# Patient Record
Sex: Female | Born: 1955 | Race: Black or African American | Hispanic: No | Marital: Single | Smoking: Former smoker
Health system: Southern US, Community
[De-identification: ages and names within clinical notes are randomized; demographics above are authoritative.]

## PROBLEM LIST (undated history)

## (undated) DIAGNOSIS — Q357 Cleft uvula: Secondary | ICD-10-CM

## (undated) DIAGNOSIS — D8683 Sarcoid iridocyclitis: Secondary | ICD-10-CM

## (undated) DIAGNOSIS — Z87412 Personal history of vulvar dysplasia: Secondary | ICD-10-CM

## (undated) DIAGNOSIS — Z8619 Personal history of other infectious and parasitic diseases: Secondary | ICD-10-CM

## (undated) DIAGNOSIS — I10 Essential (primary) hypertension: Secondary | ICD-10-CM

## (undated) DIAGNOSIS — R911 Solitary pulmonary nodule: Secondary | ICD-10-CM

## (undated) DIAGNOSIS — Z973 Presence of spectacles and contact lenses: Secondary | ICD-10-CM

## (undated) DIAGNOSIS — C541 Malignant neoplasm of endometrium: Secondary | ICD-10-CM

## (undated) DIAGNOSIS — Z9221 Personal history of antineoplastic chemotherapy: Secondary | ICD-10-CM

## (undated) DIAGNOSIS — Z923 Personal history of irradiation: Secondary | ICD-10-CM

## (undated) DIAGNOSIS — Z85048 Personal history of other malignant neoplasm of rectum, rectosigmoid junction, and anus: Secondary | ICD-10-CM

## (undated) DIAGNOSIS — C210 Malignant neoplasm of anus, unspecified: Secondary | ICD-10-CM

## (undated) DIAGNOSIS — G5611 Other lesions of median nerve, right upper limb: Secondary | ICD-10-CM

## (undated) DIAGNOSIS — D860 Sarcoidosis of lung: Secondary | ICD-10-CM

## (undated) DIAGNOSIS — R058 Other specified cough: Secondary | ICD-10-CM

## (undated) DIAGNOSIS — K219 Gastro-esophageal reflux disease without esophagitis: Secondary | ICD-10-CM

## (undated) DIAGNOSIS — Z87730 Personal history of (corrected) cleft lip and palate: Secondary | ICD-10-CM

## (undated) DIAGNOSIS — Z5189 Encounter for other specified aftercare: Secondary | ICD-10-CM

## (undated) DIAGNOSIS — R05 Cough: Secondary | ICD-10-CM

## (undated) DIAGNOSIS — Z87410 Personal history of cervical dysplasia: Secondary | ICD-10-CM

## (undated) HISTORY — DX: Solitary pulmonary nodule: R91.1

## (undated) HISTORY — DX: Malignant neoplasm of anus, unspecified: C21.0

## (undated) HISTORY — DX: Encounter for other specified aftercare: Z51.89

## (undated) HISTORY — DX: Cleft uvula: Q35.7

## (undated) HISTORY — DX: Essential (primary) hypertension: I10

## (undated) HISTORY — DX: Gastro-esophageal reflux disease without esophagitis: K21.9

## (undated) HISTORY — PX: OTHER SURGICAL HISTORY: SHX169

## (undated) HISTORY — PX: CLEFT LIP REPAIR: SHX5315

---

## 1998-07-01 ENCOUNTER — Emergency Department (HOSPITAL_COMMUNITY): Admission: EM | Admit: 1998-07-01 | Discharge: 1998-07-01 | Payer: Self-pay | Admitting: Emergency Medicine

## 1999-10-23 ENCOUNTER — Other Ambulatory Visit: Admission: RE | Admit: 1999-10-23 | Discharge: 1999-10-23 | Payer: Self-pay | Admitting: Obstetrics and Gynecology

## 2000-02-14 ENCOUNTER — Encounter: Payer: Self-pay | Admitting: Cardiology

## 2000-02-14 ENCOUNTER — Encounter: Admission: RE | Admit: 2000-02-14 | Discharge: 2000-02-14 | Payer: Self-pay | Admitting: Cardiology

## 2000-03-03 ENCOUNTER — Emergency Department (HOSPITAL_COMMUNITY): Admission: EM | Admit: 2000-03-03 | Discharge: 2000-03-03 | Payer: Self-pay | Admitting: Emergency Medicine

## 2000-03-03 ENCOUNTER — Encounter: Payer: Self-pay | Admitting: Emergency Medicine

## 2000-09-13 ENCOUNTER — Emergency Department (HOSPITAL_COMMUNITY): Admission: EM | Admit: 2000-09-13 | Discharge: 2000-09-13 | Payer: Self-pay | Admitting: Emergency Medicine

## 2000-09-13 ENCOUNTER — Encounter: Payer: Self-pay | Admitting: Emergency Medicine

## 2000-09-22 ENCOUNTER — Ambulatory Visit (HOSPITAL_COMMUNITY): Admission: RE | Admit: 2000-09-22 | Discharge: 2000-09-22 | Payer: Self-pay | Admitting: Cardiology

## 2000-11-07 ENCOUNTER — Encounter: Admission: RE | Admit: 2000-11-07 | Discharge: 2000-11-07 | Payer: Self-pay | Admitting: Internal Medicine

## 2001-06-12 ENCOUNTER — Other Ambulatory Visit: Admission: RE | Admit: 2001-06-12 | Discharge: 2001-06-12 | Payer: Self-pay | Admitting: Obstetrics and Gynecology

## 2002-09-24 ENCOUNTER — Other Ambulatory Visit: Admission: RE | Admit: 2002-09-24 | Discharge: 2002-09-24 | Payer: Self-pay | Admitting: Obstetrics and Gynecology

## 2003-08-14 ENCOUNTER — Emergency Department (HOSPITAL_COMMUNITY): Admission: EM | Admit: 2003-08-14 | Discharge: 2003-08-15 | Payer: Self-pay | Admitting: Emergency Medicine

## 2003-12-17 DIAGNOSIS — Z85048 Personal history of other malignant neoplasm of rectum, rectosigmoid junction, and anus: Secondary | ICD-10-CM | POA: Insufficient documentation

## 2004-02-22 ENCOUNTER — Other Ambulatory Visit: Admission: RE | Admit: 2004-02-22 | Discharge: 2004-02-22 | Payer: Self-pay | Admitting: Obstetrics and Gynecology

## 2004-03-16 ENCOUNTER — Encounter: Admission: RE | Admit: 2004-03-16 | Discharge: 2004-03-16 | Payer: Self-pay | Admitting: General Surgery

## 2004-03-23 ENCOUNTER — Ambulatory Visit: Admission: RE | Admit: 2004-03-23 | Discharge: 2004-06-21 | Payer: Self-pay | Admitting: Radiation Oncology

## 2004-03-23 ENCOUNTER — Encounter: Admission: RE | Admit: 2004-03-23 | Discharge: 2004-03-23 | Payer: Self-pay | Admitting: Oncology

## 2004-03-27 ENCOUNTER — Encounter (INDEPENDENT_AMBULATORY_CARE_PROVIDER_SITE_OTHER): Payer: Self-pay | Admitting: *Deleted

## 2004-03-27 ENCOUNTER — Inpatient Hospital Stay (HOSPITAL_COMMUNITY): Admission: EM | Admit: 2004-03-27 | Discharge: 2004-03-30 | Payer: Self-pay | Admitting: General Surgery

## 2004-03-27 HISTORY — PX: OTHER SURGICAL HISTORY: SHX169

## 2004-05-31 ENCOUNTER — Inpatient Hospital Stay (HOSPITAL_COMMUNITY): Admission: RE | Admit: 2004-05-31 | Discharge: 2004-06-06 | Payer: Self-pay | Admitting: Oncology

## 2004-07-12 ENCOUNTER — Ambulatory Visit (HOSPITAL_COMMUNITY): Admission: RE | Admit: 2004-07-12 | Discharge: 2004-07-12 | Payer: Self-pay | Admitting: Oncology

## 2004-07-12 ENCOUNTER — Ambulatory Visit: Admission: RE | Admit: 2004-07-12 | Discharge: 2004-07-12 | Payer: Self-pay | Admitting: Radiation Oncology

## 2004-08-21 ENCOUNTER — Ambulatory Visit (HOSPITAL_COMMUNITY): Admission: RE | Admit: 2004-08-21 | Discharge: 2004-08-21 | Payer: Self-pay | Admitting: Cardiology

## 2004-09-20 ENCOUNTER — Ambulatory Visit (HOSPITAL_COMMUNITY): Admission: RE | Admit: 2004-09-20 | Discharge: 2004-09-20 | Payer: Self-pay | Admitting: Oncology

## 2004-10-11 ENCOUNTER — Ambulatory Visit: Admission: RE | Admit: 2004-10-11 | Discharge: 2004-10-11 | Payer: Self-pay | Admitting: Radiation Oncology

## 2004-10-16 ENCOUNTER — Ambulatory Visit (HOSPITAL_COMMUNITY): Admission: RE | Admit: 2004-10-16 | Discharge: 2004-10-16 | Payer: Self-pay | Admitting: General Surgery

## 2004-10-16 ENCOUNTER — Encounter (INDEPENDENT_AMBULATORY_CARE_PROVIDER_SITE_OTHER): Payer: Self-pay | Admitting: *Deleted

## 2004-10-16 HISTORY — PX: OTHER SURGICAL HISTORY: SHX169

## 2004-12-06 ENCOUNTER — Ambulatory Visit: Payer: Self-pay | Admitting: Oncology

## 2005-01-07 ENCOUNTER — Ambulatory Visit (HOSPITAL_COMMUNITY): Admission: RE | Admit: 2005-01-07 | Discharge: 2005-01-07 | Payer: Self-pay | Admitting: Surgery

## 2005-02-27 ENCOUNTER — Ambulatory Visit: Payer: Self-pay | Admitting: Oncology

## 2005-03-01 ENCOUNTER — Ambulatory Visit (HOSPITAL_COMMUNITY): Admission: RE | Admit: 2005-03-01 | Discharge: 2005-03-01 | Payer: Self-pay | Admitting: Oncology

## 2005-04-11 ENCOUNTER — Ambulatory Visit: Admission: RE | Admit: 2005-04-11 | Discharge: 2005-04-11 | Payer: Self-pay | Admitting: Radiation Oncology

## 2005-05-21 ENCOUNTER — Ambulatory Visit: Admission: RE | Admit: 2005-05-21 | Discharge: 2005-07-22 | Payer: Self-pay | Admitting: Radiation Oncology

## 2005-06-06 ENCOUNTER — Ambulatory Visit (HOSPITAL_COMMUNITY): Admission: RE | Admit: 2005-06-06 | Discharge: 2005-06-06 | Payer: Self-pay | Admitting: Surgery

## 2005-06-06 ENCOUNTER — Encounter (INDEPENDENT_AMBULATORY_CARE_PROVIDER_SITE_OTHER): Payer: Self-pay | Admitting: Specialist

## 2005-06-06 ENCOUNTER — Ambulatory Visit (HOSPITAL_BASED_OUTPATIENT_CLINIC_OR_DEPARTMENT_OTHER): Admission: RE | Admit: 2005-06-06 | Discharge: 2005-06-06 | Payer: Self-pay | Admitting: Surgery

## 2005-06-28 ENCOUNTER — Ambulatory Visit: Payer: Self-pay | Admitting: Oncology

## 2005-07-02 ENCOUNTER — Ambulatory Visit (HOSPITAL_COMMUNITY): Admission: RE | Admit: 2005-07-02 | Discharge: 2005-07-02 | Payer: Self-pay | Admitting: Oncology

## 2005-07-12 ENCOUNTER — Ambulatory Visit: Payer: Self-pay | Admitting: Internal Medicine

## 2005-07-19 ENCOUNTER — Ambulatory Visit: Payer: Self-pay | Admitting: Internal Medicine

## 2005-09-05 ENCOUNTER — Encounter: Admission: RE | Admit: 2005-09-05 | Discharge: 2005-09-05 | Payer: Self-pay | Admitting: Cardiology

## 2005-10-07 ENCOUNTER — Ambulatory Visit (HOSPITAL_COMMUNITY): Admission: RE | Admit: 2005-10-07 | Discharge: 2005-10-07 | Payer: Self-pay | Admitting: Oncology

## 2005-10-23 ENCOUNTER — Ambulatory Visit: Payer: Self-pay | Admitting: Oncology

## 2005-10-31 ENCOUNTER — Encounter: Admission: RE | Admit: 2005-10-31 | Discharge: 2005-10-31 | Payer: Self-pay | Admitting: Cardiology

## 2005-12-23 ENCOUNTER — Encounter: Admission: RE | Admit: 2005-12-23 | Discharge: 2005-12-23 | Payer: Self-pay | Admitting: Orthopedic Surgery

## 2006-02-12 ENCOUNTER — Ambulatory Visit: Payer: Self-pay | Admitting: Oncology

## 2006-02-17 ENCOUNTER — Ambulatory Visit (HOSPITAL_COMMUNITY): Admission: RE | Admit: 2006-02-17 | Discharge: 2006-02-17 | Payer: Self-pay | Admitting: Oncology

## 2006-04-07 ENCOUNTER — Encounter: Admission: RE | Admit: 2006-04-07 | Discharge: 2006-04-07 | Payer: Self-pay | Admitting: Cardiology

## 2006-06-06 ENCOUNTER — Ambulatory Visit (HOSPITAL_COMMUNITY): Admission: RE | Admit: 2006-06-06 | Discharge: 2006-06-06 | Payer: Self-pay | Admitting: Cardiology

## 2006-06-06 ENCOUNTER — Encounter (INDEPENDENT_AMBULATORY_CARE_PROVIDER_SITE_OTHER): Payer: Self-pay | Admitting: *Deleted

## 2006-06-27 ENCOUNTER — Encounter (HOSPITAL_COMMUNITY): Admission: RE | Admit: 2006-06-27 | Discharge: 2006-08-28 | Payer: Self-pay | Admitting: Cardiology

## 2006-06-27 HISTORY — PX: CARDIOVASCULAR STRESS TEST: SHX262

## 2006-08-13 ENCOUNTER — Ambulatory Visit: Payer: Self-pay | Admitting: Oncology

## 2006-08-19 ENCOUNTER — Ambulatory Visit (HOSPITAL_COMMUNITY): Admission: RE | Admit: 2006-08-19 | Discharge: 2006-08-19 | Payer: Self-pay | Admitting: Oncology

## 2006-08-19 LAB — CBC WITH DIFFERENTIAL/PLATELET
EOS%: 3.9 % (ref 0.0–7.0)
Eosinophils Absolute: 0.2 10*3/uL (ref 0.0–0.5)
MCH: 33.7 pg (ref 26.0–34.0)
MCV: 93.6 fL (ref 81.0–101.0)
MONO%: 7.8 % (ref 0.0–13.0)
NEUT#: 4.1 10*3/uL (ref 1.5–6.5)
RBC: 3.73 10*6/uL (ref 3.70–5.32)
RDW: 13.9 % (ref 11.3–14.5)

## 2006-08-19 LAB — COMPREHENSIVE METABOLIC PANEL
ALT: 23 U/L (ref 0–40)
AST: 21 U/L (ref 0–37)
Albumin: 4.2 g/dL (ref 3.5–5.2)
Alkaline Phosphatase: 116 U/L (ref 39–117)
Potassium: 4.4 mEq/L (ref 3.5–5.3)
Sodium: 136 mEq/L (ref 135–145)
Total Protein: 8 g/dL (ref 6.0–8.3)

## 2006-10-10 ENCOUNTER — Encounter (INDEPENDENT_AMBULATORY_CARE_PROVIDER_SITE_OTHER): Payer: Self-pay | Admitting: *Deleted

## 2006-10-10 ENCOUNTER — Ambulatory Visit (HOSPITAL_COMMUNITY): Admission: RE | Admit: 2006-10-10 | Discharge: 2006-10-10 | Payer: Self-pay | Admitting: General Surgery

## 2006-10-10 HISTORY — PX: OTHER SURGICAL HISTORY: SHX169

## 2006-12-17 ENCOUNTER — Ambulatory Visit (HOSPITAL_BASED_OUTPATIENT_CLINIC_OR_DEPARTMENT_OTHER): Admission: RE | Admit: 2006-12-17 | Discharge: 2006-12-17 | Payer: Self-pay | Admitting: Orthopedic Surgery

## 2007-02-09 ENCOUNTER — Ambulatory Visit: Payer: Self-pay | Admitting: Oncology

## 2007-02-11 LAB — CBC WITH DIFFERENTIAL/PLATELET
BASO%: 0 % (ref 0.0–2.0)
Eosinophils Absolute: 0 10*3/uL (ref 0.0–0.5)
LYMPH%: 9.1 % — ABNORMAL LOW (ref 14.0–48.0)
MCHC: 36.5 g/dL — ABNORMAL HIGH (ref 32.0–36.0)
MCV: 91.2 fL (ref 81.0–101.0)
MONO%: 4 % (ref 0.0–13.0)
Platelets: 264 10*3/uL (ref 145–400)
RBC: 3.88 10*6/uL (ref 3.70–5.32)

## 2007-02-11 LAB — COMPREHENSIVE METABOLIC PANEL
Alkaline Phosphatase: 129 U/L — ABNORMAL HIGH (ref 39–117)
Creatinine, Ser: 0.79 mg/dL (ref 0.40–1.20)
Glucose, Bld: 107 mg/dL — ABNORMAL HIGH (ref 70–99)
Sodium: 139 mEq/L (ref 135–145)
Total Bilirubin: 0.8 mg/dL (ref 0.3–1.2)
Total Protein: 8.2 g/dL (ref 6.0–8.3)

## 2007-02-13 ENCOUNTER — Ambulatory Visit (HOSPITAL_COMMUNITY): Admission: RE | Admit: 2007-02-13 | Discharge: 2007-02-13 | Payer: Self-pay | Admitting: Oncology

## 2007-03-31 ENCOUNTER — Encounter: Admission: RE | Admit: 2007-03-31 | Discharge: 2007-03-31 | Payer: Self-pay | Admitting: Cardiology

## 2007-08-14 ENCOUNTER — Ambulatory Visit (HOSPITAL_COMMUNITY): Admission: RE | Admit: 2007-08-14 | Discharge: 2007-08-14 | Payer: Self-pay | Admitting: Oncology

## 2007-08-14 ENCOUNTER — Ambulatory Visit: Payer: Self-pay | Admitting: Oncology

## 2007-12-13 ENCOUNTER — Emergency Department (HOSPITAL_COMMUNITY): Admission: EM | Admit: 2007-12-13 | Discharge: 2007-12-13 | Payer: Self-pay | Admitting: Emergency Medicine

## 2008-01-19 ENCOUNTER — Encounter: Payer: Self-pay | Admitting: Obstetrics and Gynecology

## 2008-01-20 ENCOUNTER — Inpatient Hospital Stay (HOSPITAL_COMMUNITY): Admission: EM | Admit: 2008-01-20 | Discharge: 2008-01-27 | Payer: Self-pay | Admitting: Cardiology

## 2008-01-20 ENCOUNTER — Ambulatory Visit: Payer: Self-pay | Admitting: Infectious Disease

## 2008-01-20 ENCOUNTER — Ambulatory Visit: Payer: Self-pay | Admitting: Emergency Medicine

## 2008-01-22 ENCOUNTER — Encounter: Payer: Self-pay | Admitting: Pulmonary Disease

## 2008-01-22 HISTORY — PX: BRONCHOSCOPY: SUR163

## 2008-03-07 DIAGNOSIS — K219 Gastro-esophageal reflux disease without esophagitis: Secondary | ICD-10-CM | POA: Insufficient documentation

## 2008-03-07 DIAGNOSIS — I10 Essential (primary) hypertension: Secondary | ICD-10-CM | POA: Insufficient documentation

## 2008-03-07 DIAGNOSIS — Z6838 Body mass index (BMI) 38.0-38.9, adult: Secondary | ICD-10-CM | POA: Insufficient documentation

## 2008-03-07 DIAGNOSIS — D860 Sarcoidosis of lung: Secondary | ICD-10-CM | POA: Insufficient documentation

## 2008-03-07 DIAGNOSIS — J984 Other disorders of lung: Secondary | ICD-10-CM | POA: Insufficient documentation

## 2008-03-07 DIAGNOSIS — D869 Sarcoidosis, unspecified: Secondary | ICD-10-CM | POA: Insufficient documentation

## 2008-03-08 ENCOUNTER — Ambulatory Visit: Payer: Self-pay | Admitting: Internal Medicine

## 2008-04-05 ENCOUNTER — Ambulatory Visit: Payer: Self-pay | Admitting: Internal Medicine

## 2008-04-20 ENCOUNTER — Ambulatory Visit: Payer: Self-pay | Admitting: Oncology

## 2008-04-22 LAB — CBC WITH DIFFERENTIAL/PLATELET
Basophils Absolute: 0 10*3/uL (ref 0.0–0.1)
EOS%: 3.6 % (ref 0.0–7.0)
Eosinophils Absolute: 0.2 10*3/uL (ref 0.0–0.5)
HGB: 12.4 g/dL (ref 11.6–15.9)
MCH: 32.9 pg (ref 26.0–34.0)
MONO#: 0.5 10*3/uL (ref 0.1–0.9)
NEUT#: 4.7 10*3/uL (ref 1.5–6.5)
RDW: 14 % (ref 11.3–14.5)
WBC: 6.2 10*3/uL (ref 3.9–10.0)
lymph#: 0.8 10*3/uL — ABNORMAL LOW (ref 0.9–3.3)

## 2008-04-22 LAB — COMPREHENSIVE METABOLIC PANEL
AST: 33 U/L (ref 0–37)
Albumin: 4.3 g/dL (ref 3.5–5.2)
BUN: 12 mg/dL (ref 6–23)
CO2: 25 mEq/L (ref 19–32)
Calcium: 9.1 mg/dL (ref 8.4–10.5)
Chloride: 105 mEq/L (ref 96–112)
Potassium: 3.8 mEq/L (ref 3.5–5.3)

## 2008-04-27 ENCOUNTER — Ambulatory Visit: Payer: Self-pay | Admitting: Internal Medicine

## 2008-08-11 ENCOUNTER — Inpatient Hospital Stay (HOSPITAL_COMMUNITY): Admission: EM | Admit: 2008-08-11 | Discharge: 2008-08-16 | Payer: Self-pay | Admitting: Emergency Medicine

## 2008-08-14 HISTORY — PX: LAPAROSCOPIC CHOLECYSTECTOMY: SUR755

## 2008-08-15 ENCOUNTER — Encounter (INDEPENDENT_AMBULATORY_CARE_PROVIDER_SITE_OTHER): Payer: Self-pay | Admitting: General Surgery

## 2008-11-24 ENCOUNTER — Ambulatory Visit: Payer: Self-pay | Admitting: Internal Medicine

## 2008-11-24 DIAGNOSIS — J069 Acute upper respiratory infection, unspecified: Secondary | ICD-10-CM | POA: Insufficient documentation

## 2008-12-26 ENCOUNTER — Ambulatory Visit: Payer: Self-pay | Admitting: Internal Medicine

## 2008-12-28 LAB — CONVERTED CEMR LAB: Angiotensin 1 Converting Enzyme: 64 units/L (ref 9–67)

## 2009-02-20 ENCOUNTER — Ambulatory Visit: Payer: Self-pay | Admitting: Internal Medicine

## 2009-04-17 ENCOUNTER — Ambulatory Visit: Payer: Self-pay | Admitting: Internal Medicine

## 2009-05-10 LAB — HM COLONOSCOPY

## 2009-05-12 ENCOUNTER — Encounter: Admission: RE | Admit: 2009-05-12 | Discharge: 2009-05-12 | Payer: Self-pay | Admitting: Cardiology

## 2009-05-31 ENCOUNTER — Encounter: Admission: RE | Admit: 2009-05-31 | Discharge: 2009-05-31 | Payer: Self-pay | Admitting: Cardiology

## 2009-06-12 ENCOUNTER — Ambulatory Visit: Payer: Self-pay | Admitting: Internal Medicine

## 2009-09-05 ENCOUNTER — Ambulatory Visit (HOSPITAL_BASED_OUTPATIENT_CLINIC_OR_DEPARTMENT_OTHER): Admission: RE | Admit: 2009-09-05 | Discharge: 2009-09-05 | Payer: Self-pay | Admitting: Orthopedic Surgery

## 2010-01-05 ENCOUNTER — Emergency Department (HOSPITAL_COMMUNITY): Admission: EM | Admit: 2010-01-05 | Discharge: 2010-01-05 | Payer: Self-pay | Admitting: Emergency Medicine

## 2010-02-08 ENCOUNTER — Telehealth: Payer: Self-pay | Admitting: Internal Medicine

## 2010-02-08 ENCOUNTER — Encounter: Admission: RE | Admit: 2010-02-08 | Discharge: 2010-02-08 | Payer: Self-pay | Admitting: Cardiology

## 2010-02-09 ENCOUNTER — Ambulatory Visit: Payer: Self-pay | Admitting: Internal Medicine

## 2010-03-23 ENCOUNTER — Ambulatory Visit: Payer: Self-pay | Admitting: Internal Medicine

## 2010-04-14 ENCOUNTER — Emergency Department (HOSPITAL_COMMUNITY): Admission: EM | Admit: 2010-04-14 | Discharge: 2010-04-14 | Payer: Self-pay | Admitting: Emergency Medicine

## 2011-01-06 ENCOUNTER — Encounter: Payer: Self-pay | Admitting: Cardiology

## 2011-01-06 ENCOUNTER — Encounter: Payer: Self-pay | Admitting: Orthopedic Surgery

## 2011-01-06 ENCOUNTER — Encounter: Payer: Self-pay | Admitting: Oncology

## 2011-01-15 NOTE — Assessment & Plan Note (Signed)
Summary: Pulmonary/ ext f/u ov for eval of cough/sarcoid   Primary Provider/Referring Provider:  Spruill  CC:  6 wk followup with PFT's.  The patient c/o a dry cough. No mucus production.Marland Kitchen  History of Present Illness: 67 yobf minimal smoking hx admitted from 01/20/2008 through 01/27/2008 for evaluation of an FUO with cough and parenchymal pulmonary infiltrates which by transbronchial biopsy dated 2 /05/2008 represented noncaseating  granulomatous  inflammation.  She previously was diagnosed with  suspected sarcoid in 1996 but prior to that had not required any form of prednisone.  Last chronic prednisone  04/15/08  with no flare cough no nodules (right leg and left hand and right arm),  November 24, 2008 ov abrupt onset 11/13/08  coughing and fever and chills and aches rx with abx and prednisone rx since 12/8 and by day of ov just bothered by cough.   December 26, 2008 worse day of ov with fever feels same as in past, each time in setting of having stopped prednisone in last 1 -2 weeks prior to ov, rec floor of 10 mg one half daily  February 20, 2009 ov no flares since last ov on Prednisone 10mg  one half daily rec one half every other day  Apr 17, 2009 on pred 10 one half every other day no flare of sob, ocular or articular co's, fever chills or sweats or unintended wt loss or nausea/anorexia. Did have flare of cough with pollen, now better.  Try Prednisone every 3 rd  day.   June 12, 2009 no symptoms on 2/3 days when doesn't take prednisone, no nausea, rash, cough, sob, articular or ocular c/o's.  so stop prednisone > no trouble.  March 23, 2010  cc  dry cough x 2months ever since the flu, day > night assoc with clear  nasal drainage. no ocular or articular co's.  Pt denies any significant sore throat, dysphagia, itching, sneezing,  nasal congestion or excess secretions,  fever, chills, sweats, unintended wt loss, pleuritic or exertional cp, hempoptysis, change in activity tolerance  orthopnea pnd or  leg swelling . Pt also denies any obvious fluctuation in symptoms with weather or environmental change or other alleviating or aggravating factors.        Current Medications (verified): 1)  Diovan 80 Mg Tabs (Valsartan) .... Take 1 Tablet By Mouth Once A Day 2)  Ventolin Hfa 108 (90 Base) Mcg/act  Aers (Albuterol Sulfate) .... 2 Puffs Every 4 Hrs As Needed 3)  Furosemide 20 Mg Tabs (Furosemide) .... Take 1 Tablet By Mouth Once A Day 4)  Aciphex 20 Mg Tbec (Rabeprazole Sodium) .... Take 1 Tablet By Mouth Once A Day 5)  Multivitamins  Tabs (Multiple Vitamin) .... Take 1 Tablet By Mouth Once A Day  Allergies (verified): 1)  ! Penicillin  Past History:  Past Medical History: HYPERTENSION (ICD-401.9) PULMONARY NODULE, LEFT UPPER LOBE (ICD-518.89) OBESITY (ICD-278.00) SARCOIDOSIS (ICD-135) originally evaluated  elsewhere in about 1996 with no tissue diagnosis       - TBBX pos ncg 01/22/08      - Chronic pred rx 2/09> 5/09      - Restart Pred December 26, 2008  >   June 12, 2009  stopped      - PFT's March 23, 2010 VC 1.77 (66%) no obst,  ERV 34%, DLCO 53 > 115% corrected G E R D (ICD-530.81) rectal cancer status post chemotherapy and radiation July 2005       Vital Signs:  Patient profile:   55 year old female Height:      66 inches (167.64 cm) Weight:      221 pounds (100.45 kg) BMI:     35.80 O2 Sat:      90 % on Room air Temp:     97.9 degrees F (36.61 degrees C) oral Pulse rate:   90 / minute BP sitting:   132 / 80  (left arm) Cuff size:   large  Vitals Entered By: Michel Bickers CMA (March 23, 2010 9:56 AM)  O2 Sat at Rest %:  90 O2 Flow:  Room air  Physical Exam  Additional Exam:  Ambulatory mod obese bf in no acute distress.  wt  216 November 24, 2008  > 220 December 26, 2008 >  221 Apr 17, 2009 > 221 June 12, 2009 > 230 February 10, 2010  > 221 March 23, 2010  HEENT: nl dentition, turbinates, and orophanx. Nl external ear canals without cough reflex Neck without  JVD/Nodes/TM Lungs minimal insp and exp rhonchi. RRR no s3 or murmur or increase in P2 Abd soft and benign with nl excursion in the supine position. No bruits or organomegaly Ext warm without calf tenderness, cyanosis clubbing or edema Skin warm and dry without lesions     CXR  Procedure date:  03/23/2010  Findings:      Findings: Since 02/08/2010, regression of the airspace in passing the superimposed upon bibasilar chronic interstitial lung changes of known sarcoidosis.  Heart size is normal.  Slight right azygos and bilateral hilar adenopathy stable.  No new significant abnormalities seen.   IMPRESSION:   1.  Resolution previous right lower lobe patchy pneumonia superimposed on chronic interstitial lung disease. 2.  Stable chronic sarcoidosis. 3.  No interval acute abnormality.  Impression & Recommendations:  Problem # 1:  SARCOIDOSIS (ICD-135)  no evidence of activity off prednisone, continue off.  The goal with a chronic steroid dependent illness is always arriving at the lowest effective dose that controls the disease/symptoms and not accepting a set "formula" which is based on statistics that don't take into accound individual variability or the natural hx of the dz in every individual patient, which may well vary over time. For now floor in "0" prednisone using the philsophy that the punishment needs to fit the crime  Problem # 2:  G E R D (ICD-530.81)  Her cough seems most c/w uri vs Upper airway cough syndrome, so named because it's frequently impossible to sort out how much is LPR vs CR/sinusitis with freq throat clearing generating secondary extra esophageal GERD from wide swings in gastric pressure that occur with throat clearing, promoting self use of mint and menthol lozenges that reduce the lower esophageal sphincter tone and exacerbate the problem further.  These symptoms are easily confused with asthma/copd by even experienced pulmonogists because they overlap so  much.  These are the same pts who not infrequently have failed to tolerate ace inhibitors,  dry powder inhalers or biphosphonates or report having reflux symptoms that don't respond to standard doses of PPI.  See instructions for specific recommendations     Orders: Est. Patient Level IV (16109)  Other Orders: T-2 View CXR (71020TC)  Patient Instructions: 1)  whenever start coughing you need to take aciphex 20 mg Take one 30-60 min before first and last meals of the day and add pepcid 20 mg at bedtime 2)  For drainage down the throat try chlortrimeton 4 mg every  6 hours as needed 3)  GERD (REFLUX)  is a common cause of respiratory symptoms. It commonly presents without heartburn and can be treated with medication, but also with lifestyle changes including avoidance of late meals, excessive alcohol, smoking cessation, and avoid fatty foods, chocolate, peppermint, colas, red wine, and acidic juices such as orange juice. NO MINT OR MENTHOL PRODUCTS SO NO COUGH DROPS  4)  USE SUGARLESS CANDY INSTEAD (jolley ranchers)  5)  NO OIL BASED VITAMINS  6)  If not better in 2 weeks, return to office. 7)  If better after 2 weeks, drop off second dose of aciphex and return in 3 months

## 2011-01-15 NOTE — Miscellaneous (Signed)
Summary: Orders Update pft charges  Clinical Lists Changes  Orders: Added new Service order of Carbon Monoxide diffusing w/capacity (94720) - Signed Added new Service order of Lung Volumes (94240) - Signed Added new Service order of Spirometry (Pre & Post) (94060) - Signed 

## 2011-01-15 NOTE — Progress Notes (Signed)
Summary: appt?  Phone Note Call from Patient Call back at [facility] Phone [phone]   Caller: Patient Call For: Ardis Fullwood Reason for Call: Talk to Nurse Summary of Call: has had the flu.  Also has Sarcoidosis.  Her PCP told her to call MW and see if he wanted to see her.  She saw PCP today and they did CXR. Initial call taken by: Eugene Gavia,  February 08, 2010 2:00 PM  Follow-up for Phone Call        called and spoke with pt and she will come in the am to follow up with MW---she did see her primary care md today and did have cxr done---told to call to see if MW wanted to see her---last ov with MW was 05-2009 Randell Loop CMA  February 08, 2010 2:30 PM

## 2011-01-15 NOTE — Assessment & Plan Note (Signed)
Summary: Pulmonary/ uri   Copy to:  P Primary Provider/Referring Provider:  Spruill  CC:  Pt c/o productive cough with small amounts of yellow mucus, T-110, chills, bodyaches, and pt has been treated with antibiotic x 4 days three times daily 500mg  by Dr. Shana Chute. Pt was seen by Dr. Shana Chute 02-07-10 and advised to follow up with MW. Pt states had cxr yesterday.  History of Present Illness: 55-yobf minimal smoking hx admitted from 01/20/2008 through 01/27/2008 for evaluation of an FUO with cough and parenchymal pulmonary infiltrates which by transbronchial biopsy dated 2 /05/2008 represented noncaseating  granulomatous  inflammation.  She previously was diagnosed with  suspected sarcoid in 1996 but prior to that had not required any form of prednisone.  Last chronic prednisone  04/15/08  with no flare cough no nodules (right leg and left hand and right arm),  November 24, 2008 ov abrupt onset 11/13/08  coughing and fever and chills and aches rx with abx and prednisone rx since 12/8 and by day of ov just bothered by cough.   December 26, 2008 worse day of ov with fever feels same as in past, each time in setting of having stopped prednisone in last 1 -2 weeks prior to ov, rec floor of 10 mg one half daily  February 20, 2009 ov no flares since last ov on Prednisone 10mg  one half daily rec one half every other day  Apr 17, 2009 on pred 10 one half every other day no flare of sob, ocular or articular co's, fever chills or sweats or unintended wt loss or nausea/anorexia. Did have flare of cough with pollen, now better.  Try Prednisone every 3 rd  day.   June 12, 2009 no symptoms on 2/3 days when doesn't take prednisone, no nausea, rash, cough, sob, articular or ocular c/o's.  so stop prednisone > no trouble.  February 09, 2010 Pt c/o productive cough with small amounts of yellow mucus, T-100.1 , chills, bodyaches, pt has been treated with antibiotic x 4 /7 days three times daily 500mg  by Dr. Shana Chute. Pt was  seen by Dr. Shana Chute 02-07-10 and advised to follow up with MW. Pt states had cxr 2/24.  Pt denies any significant sore throat, dysphagia, itching, sneezing,  nasal congestion or excess secretions,  soaking sweats, unintended wt loss, pleuritic or exertional cp, hempoptysis, change in activity tolerance  orthopnea pnd or leg swelling.  no arthralgias, rash or ocular c/os.  Current Medications (verified): 1)  Diovan 80 Mg Tabs (Valsartan) .... Take 1 Tablet By Mouth Once A Day 2)  Ventolin Hfa 108 (90 Base) Mcg/act  Aers (Albuterol Sulfate) .... 2 Puffs Every 4 Hrs As Needed 3)  Furosemide 20 Mg Tabs (Furosemide) .... Take 1 Tablet By Mouth Once A Day 4)  Aciphex 20 Mg Tbec (Rabeprazole Sodium) .... Take 1 Tablet By Mouth Once A Day 5)  Multivitamins  Tabs (Multiple Vitamin) .... Take 1 Tablet By Mouth Once A Day  Allergies (verified): 1)  ! Penicillin  Past History:  Past Medical History: HYPERTENSION (ICD-401.9) PULMONARY NODULE, LEFT UPPER LOBE (ICD-518.89) OBESITY (ICD-278.00) SARCOIDOSIS (ICD-135) originally evaluated  elsewhere in about 1996 with no tissue diagnosis  rendered      - TBBX pos ncg 01/22/08      - Chronic pred rx 2/09> 5/09      - Restart Pred December 26, 2008  >   June 12, 2009  stopped G E R D (ICD-530.81) rectal cancer status post chemotherapy and  radiation July 2005       Vital Signs:  Patient profile:   55 year old female Height:      65 inches Weight:      230.13 pounds BMI:     38.43 O2 Sat:      95 % on Room air Temp:     98.1 degrees F oral Pulse rate:   85 / minute BP sitting:   160 / 96  (left arm) Cuff size:   large  Vitals Entered ByShanda Bumps Robinson(February 09, 2010 10:32 AM)  O2 Flow:  Room air CC: Pt c/o productive cough with small amounts of yellow mucus, T-110, chills, bodyaches, pt has been treated with antibiotic x 4 days three times daily 500mg  by Dr. Shana Chute. Pt was seen by Dr. Shana Chute 02-07-10 and advised to follow up with MW. Pt states  had cxr yesterday Comments Medications reviewed with patient Verified contact number and pharmacy with patient   Physical Exam  Additional Exam:  Ambulatory mod obese bf in no acute distress.  wt  216 November 24, 2008  > 220 December 26, 2008 > 221 02/20/09> 221 Apr 17, 2009 > 221 June 12, 2009 > 230 February 10, 2010  HEENT: nl dentition, turbinates, and orophanx. Nl external ear canals without cough reflex Neck without JVD/Nodes/TM Lungs minimal insp and exp rhonchi. RRR no s3 or murmur or increase in P2 Abd soft and benign with nl excursion in the supine position. No bruits or organomegaly Ext warm without calf tenderness, cyanosis clubbing or edema Skin warm and dry without lesions     Impression & Recommendations:  Problem # 1:  URI, ACUTE (ICD-465.9)  Explained natural h/o uri and why it's necessary in patients at risk to rx short term with PPI to reduce risk of evolving cyclical cough triggered by epithelial injury and a heightened sensitivty to the effects of any upper airway irritants,  most importantly acid - related.  See instructions for specific recommendations.    Orders: Est. Patient Level III (69678)  Problem # 2:  SARCOIDOSIS (ICD-135) no evidence of activity off prednisone, continue off.  Medications Added to Medication List This Visit: 1)  Diovan 80 Mg Tabs (Valsartan) .... Take 1 tablet by mouth once a day 2)  Furosemide 20 Mg Tabs (Furosemide) .... Take 1 tablet by mouth once a day 3)  Aciphex 20 Mg Tbec (Rabeprazole sodium) .... Take 1 tablet by mouth once a day 4)  Multivitamins Tabs (Multiple vitamin) .... Take 1 tablet by mouth once a day  Patient Instructions: 1)  Finish antibiotics 2)  Please schedule a follow-up appointment in 6 weeks, sooner if needed for cxr and pft's  3)  Continue the aciphex Take  one 30-60 min before first meal of the day 4)  GERD (REFLUX)  is a common cause of respiratory symptoms. It commonly presents without heartburn and  can be treated with medication, but also with lifestyle changes including avoidance of late meals, excessive alcohol, smoking cessation, and avoid fatty foods, chocolate, peppermint, colas, red wine, and acidic juices such as orange juice. NO MINT OR MENTHOL PRODUCTS SO NO COUGH DROPS  5)  USE SUGARLESS CANDY INSTEAD (jolley ranchers)  6)  NO OIL BASED VITAMINS

## 2011-03-03 LAB — URINALYSIS, ROUTINE W REFLEX MICROSCOPIC
Glucose, UA: NEGATIVE mg/dL
Nitrite: NEGATIVE
pH: 5 (ref 5.0–8.0)

## 2011-03-03 LAB — POCT PREGNANCY, URINE: Preg Test, Ur: NEGATIVE

## 2011-03-22 LAB — BASIC METABOLIC PANEL
BUN: 14 mg/dL (ref 6–23)
Calcium: 9.6 mg/dL (ref 8.4–10.5)
GFR calc non Af Amer: >60 mL/min (ref >60)
Potassium: 4.2 mEq/L (ref 3.5–5.1)
Sodium: 140 mEq/L (ref 135–145)

## 2011-03-22 LAB — POCT HEMOGLOBIN-HEMACUE: Hemoglobin: 12.3 g/dL (ref 12.0–15.0)

## 2011-04-30 NOTE — Consult Note (Signed)
NAMEMARKEYA, [name]               ACCOUNT NO.:  [id number]   MEDICAL RECORD NO.:  [id number]          PATIENT TYPE:  INP   LOCATION:  6732                         FACILITY:  [facility]   PHYSICIAN:  Anselm Pancoast. Weatherly, M.D.DATE OF BIRTH:  December 20, 1955   DATE OF CONSULTATION:  08/12/2008  DATE OF DISCHARGE:                                 CONSULTATION   REQUESTING PHYSICIAN:  Mohan N. Sharyn Lull, M.D.   REASON FOR CONSULTATION:  Biliary colic and cholelithiasis.   HISTORY OF PRESENT ILLNESS:  [name] is a 55 year old female patient  with past medical history of sarcoidosis and prior squamous cell  carcinoma of the rectum that was resected by Dr. Onalee Hua several years  ago.  She presented to the ER on August 11, 2008, with complaints of  right lateral upper abdominal pain, very severe in nature, that began  and awakened her from sleep, radiated over to the right upper quadrant  and the epigastric region.  This was associated with nausea and  diarrhea.  This is very severe.  She has never had pain like this  before.  She rated it 10/10.  The pain lasted for several hours and  resolved after she presented to the ER and received pain medications.  Subsequently, an ultrasound the abdomen was performed that demonstrated  cholelithiasis without evidence of acute cholecystitis or ductal  dilatation.  Surgical consultation was requested.   REVIEW OF SYSTEMS:  As per the history of present illness.  The history  present illness is specific to GI.  Otherwise, all other review of  system categories have been negative.   PAST MEDICAL HISTORY:  1. Hypertension.  2. Sarcoidosis.  3. History of recurrent UTI.   PAST SURGICAL HISTORY:  1. C-section.  2. 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  3. 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   ALLERGIES:  PENICILLIN, which causes hives.   MEDICATIONS:  Medications at home include Diovan and  Lomotil.  Here at  the hospital, the patient has been given various p.r.n. medications  including Morphine, Zofran, and Dilaudid.  She is receiving Protonix IV  as well and daily Lovenox for DVT prophylaxis.  She is on Benicar in  substitution for her Diovan.  She is also on Cipro and Flagyl IV.   SOCIAL HISTORY:  No alcohol.  No tobacco.  She works for a  [employer].  She is single and has 2 children.   PHYSICAL EXAMINATION:  GENERAL:  A pleasant female patient, currently  reporting that her prior right-sided upper abdominal pain has resolved.  VITAL SIGNS:  Temperature 98, BP 144/86, pulse 80 and regular, and  respirations 18.  NEUROLOGIC:  Cranial nerves II-XII are grossly intact.  Strength is  intact in the upper and lower extremities bilaterally.  Sensation is  intact in the upper and lower extremities bilaterally.  Gait,  ambulation, and DTRs not assessed.  PSYCH:  The patient is alert and oriented x3.  Affect is perfect to  current situation.  CHEST:  Bilateral lung  sounds are clear to auscultation posteriorly.  Respiratory effort is nonlabored.  She is satting 97% on room air.  CARDIOVASCULAR:  Heart sounds are S1 and S2 without obvious rales,  murmurs, rubs, or gallops.  No JVD.  Pulse is regular and  nontachycardiac.  No peripheral edema.  ABDOMEN:  Obese, but soft, mildly tender in the epigastric and right  upper quadrant, but no definite Murphy's sign.  EXTREMITIES:  Symmetrical in appearance without cyanosis or clubbing.   LAB:  White count 6200, hemoglobin 11.5, platelets 181,000.  Sodium 140,  potassium 3.3, CO2 28, glucose 145, BUN 9, creatinine 0.78, and alkaline  phosphatase was 120 yesterday, today is down to 93.  Otherwise, LFTs,  amylase, and lipase have been normal.  INR 0.9.  Hemoglobin A1c is  pending.   DIAGNOSTICS:  Ultrasound of the abdomen again revealed numerous  gallstones with a small contracted gallbladder without pericholecystic  fluid  or ductal dilatation.   IMPRESSION:  1. Biliary colic with cholelithiasis.  2. Hypertension.  3. Sarcoid.   PLAN:  1. At this time, we will probably proceed with laparoscopic      cholecystectomy in the morning.  The patient has already eaten      lunch today.  We will hold Lovenox tonight.  2. The patient is already on Cipro and Flagyl.  We will make sure she      receives a dose of Cipro and call to OR.  3. Risks and benefits have been discussed with the patient by Dr.      Dwain Sarna and the patient wishes to proceed.      Allison L. Rennis Harding, N.P.    ______________________________  Anselm Pancoast. Zachery Dakins, M.D.    ALE/MEDQ  D:  08/12/2008  T:  08/13/2008  Job:  [id number]

## 2011-04-30 NOTE — Op Note (Signed)
NAMEDANAYE, SOBH               ACCOUNT NO.:  [id number]   MEDICAL RECORD NO.:  [id number]          PATIENT TYPE:  INP   LOCATION:  3012                         FACILITY:  [facility]   PHYSICIAN:  Coralyn Helling, MD        DATE OF BIRTH:  1956-11-05   DATE OF PROCEDURE:  01/22/2008  DATE OF DISCHARGE:                               OPERATIVE REPORT   PREOPERATIVE DIAGNOSIS:  Rule out sarcoidosis.   POSTOPERATIVE DIAGNOSIS:  Rule out have sarcoidosis.   PROCEDURE:  Bronchoscopy with bronchoalveolar lavage, fine-needle  aspiration, and transbronchial biopsy.   DESCRIPTION OF PROCEDURE:  Ms. Garmany is a 55 year old female who has  mediastinal lymphadenopathy as well as multiple subcentimeter pulmonary  nodules.  She carries a diagnosis of sarcoidosis.  She is scheduled to  undergo bronchoscopy with biopsy.  The procedure was explained to the  patient.  Risks were detailed including possible risks of bleeding,  infection, pneumothorax, and no diagnosis.  Consent form was signed.  The patient was brought to the bronchoscopy suite.  She was given  nebulized lidocaine for anesthesia over her nares and her posterior  pharynx.  She was given total of 8 mg of Versed and 100 mcg fentanyl  intravenously for sedation and analgesia.   The bronchoscope was entered into the right naris.  The vocal cords  visualized, appeared to have normal motion.  Topical lidocaine was  instilled as needed.  The bronchoscope was entered into the trachea.  The carina was visualized and appeared not to be widened.  The mucosa  did appear to be hyperemic.  The bronchoscope was then entered into the  left main bronchus.  There were clear secretions from the airways but no  obvious endobronchial lesions.  The left upper lingular and lower lobe  orifices were visualized without any obvious endobronchial lesions.   The bronchoscope was then entered into the right main bronchus.  The  right upper and middle lobe orifices  were visualized without any obvious  inferior lesions.  There appeared to be extrinsic compression with  isolation of the right lower lobe bronchus intermedius; however, I was  able to pass the bronchoscope into this area.  Then, 120 mL of saline  was instilled into the right lower lobe with approximately 20 mL of  fluid returned, which was pink in color.   Then, fine-needle aspiration was done from the right paratracheal,  subcarinal, and right hilar lymph node regions.  Then, under  fluoroscopic guidance, transbronchial biopsy was done from the left  lower lobe, left upper lobe, and right upper lobe.  There was a minimal  amount of bleeding which resolved spontaneously.  There were no other  obvious immediate complications and the patient tolerated procedure well  otherwise.  The bronchoscope was then withdrawn and the patient was  returned to the recovery room in stable condition.  The specimens will  be sent for microbiology, cytology, and surgical pathology.  A chest x-  ray will be done and the recovery room.      Coralyn Helling, MD  Electronically Signed  VS/MEDQ  D:  01/22/2008  T:  01/24/2008  Job:  [id number]

## 2011-04-30 NOTE — Discharge Summary (Signed)
[name], [name]               ACCOUNT NO.:  [id number]   MEDICAL RECORD NO.:  [id number]          PATIENT TYPE:  INP   LOCATION:  3012                         FACILITY:  [facility]   PHYSICIAN:  Osvaldo Shipper. Spruill, M.D.DATE OF BIRTH:  10/19/1956   DATE OF ADMISSION:  01/20/2008  DATE OF DISCHARGE:  01/27/2008                               DISCHARGE SUMMARY   DISCHARGE DIAGNOSES:  1. Sarcoidosis.  2. Urinary tract infection.  3. Hypertension.  4. History of rectal anal malignancy.  5. Obesity.   CONSULTANT:  Coralyn Helling, MD.   PROCEDURE:  Endoscopic biopsy of the lung.   HISTORY:  Ms. Matura is a 55 year old patient who presented initially  to the [facility] with a complaint of fever.  The  patient was worked up in the [facility] and was noted to have a  nonacute complete blood count.  The urinalysis showed positive nitrates  with a specific gravity 1.020, moderate bilirubin 15 mg/dL of ketones.  However, the microscopic showed 0-2 white cells and 0-2 RBCs.  The  comprehensive metabolic panel showed the glucose to be slightly elevated  at 138, otherwise noncontributory.  Chest x-ray for this patient  revealed possible superimposed pneumonia and the patient was  subsequently transported to the [facility] for admission.   The admission history, physical and the hospital visits were completed  by Dr. Donia Guiles.   An infectious disease consultation was obtained.  It was their opinion  that this was a 55 year old patient with a history of sarcoidosis and  previous history of rectal cancer with recurrent fevers of unknown  origin.  The potential sources included pneumonia, urinary tract  infection, colitis and status post radiation herpetic related problems.   The patient's vital signs showed significant improvement.  Urine  cultures revealed no  particular growth.  An RPR was nonreactive.  Blood  cultures showed no significant growth.   It was the recommendation of the infectious disease as well as the  attending physician that the patient have bronchoscopy to further  evaluate the findings on x-ray and CT especially with the hilar  lymphadenopathy and left lower lobe infiltrate.  A pulmonary  consultation was obtained.   On January 22, 2008, the patient underwent bronchoscopy, fine-needle  aspiration and transbronchial biopsy.  There were no obvious  endobrachial lesions.  The left upper  lingula and lower lobe orifice  were visualized without any obvious endobronchial lesions. The right  upper and middle lobe orifices were visualized without any obvious  inferior lesion.  There appeared to be extrinsic compression with  isolation of the right lower lobe bronchus.  Transbronchial biopsies  were done under fluoroscopy.  There were no malignant cells identified.  There was some inflammation noted, otherwise normal.   After these results, it was deemed that there was probably not an  infectious cause of fever of unknown origin.  It was believed that the  fever may have been related to the acute exacerbation of the  sarcoidosis.  On January 27, 2008, it was the opinion that the patient  could be  discharged home having received maximum benefit from this  hospitalization.   FOLLOW UP:  The patient is to be seen in the office in 1 week and to be  followed up with  Pulmonary in 2 weeks or sooner if any changes,  problems or concerns.   DISCHARGE MEDICATIONS:  1. Prednisone 50 mg daily.  2. Ultram every 4-6 hours as needed for pain.  3. Ventolin 2 puffs every 6 hours.  4. Avapro 300 mg daily.  5. Acyclovir 500 mg daily.      Ivery Quale, P.A.      Osvaldo Shipper. Spruill, M.D.  Electronically Signed    HB/MEDQ  D:  03/02/2008  T:  03/03/2008  Job:  [id number]

## 2011-04-30 NOTE — Consult Note (Signed)
[name], [name]               ACCOUNT NO.:  [id number]   MEDICAL RECORD NO.:  [id number]          PATIENT TYPE:  INP   LOCATION:  6732                         FACILITY:  [facility]   PHYSICIAN:  Jordan Hawks. Elnoria Howard, MD    DATE OF BIRTH:  Feb 25, 1956   DATE OF CONSULTATION:  DATE OF DISCHARGE:  08/16/2008                                 CONSULTATION   REASON FOR CONSULTATION:  Mucoid bowel movement.   REFERRING PHYSICIAN:  Osvaldo Shipper. Spruill, M.D.   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  During workup, the  patient was noted to have symptomatic cholelithiasis, and subsequently,  she underwent a laparoscopic cholecystectomy, which was performed  without any complication.  Prior to her admission, the patient was being  evaluated by Dr. Bosie Clos for complaints a mucoid stool.  She had bowel  movements after every p.o. intake.  She described it as having think  amount of mucus, but no evidence of any hematochezia or melena.  The  patient denied having any abdominal pain, nausea and vomiting, fevers,  or chills.  She was last hospitalized in February 2009 for pneumonia and  subsequent diagnosis of her sarcoidosis but she denies taking any  medications, such as, any antibiotic medications of late, and she denies  any known sick contacts.  At this time, the patient is well, and she was  responding to antidiarrheals taken on a p.r.n. basis.  Her anal cancer  is also in remission at this time.  She states having a prior  colonoscopy.  In fact, she reports having colonoscopy every year, but  she is uncertain about which physician performs  the procedures.   PAST MEDICAL HISTORY AND PAST SURGICAL HISTORY:  As stated above.   FAMILY HISTORY:  Noncontributory.   SOCIAL HISTORY:  Negative for alcohol, tobacco, or illicit drug use.   ALLERGIES:  PENICILLIN.   MEDICATIONS:  1. Ciprofloxacin 500 mg p.o. b.i.d.  2. Lovenox 40 mg daily.  3. Ketorolac 10 mg p.o. q.6 h.  4. Roxicodone 10 mg p.o. daily.  5. Benicar 20 mg p.o. b.i.d.  6. Vicodin 1-2 tablets p.o. q.4 hours p.r.n. pain.  7. Zofran 4 mg IV q.4 h. p.r.n.   PHYSICAL EXAMINATION:  VITAL SIGNS:  Blood pressure is 152/80, heart  rate is 80, respirations 18, and temperature is 98.4.  GENERAL:  The patient is in no acute distress, alert, and oriented.  HEENT:  Normocephalic and atraumatic.  Extraocular muscles intact.  NECK:  Supple.  No lymphadenopathy.  LUNGS:  Clear to auscultation bilaterally.  CARDIOVASCULAR:  Regular rate and rhythm.  ABDOMEN:  Mildly obese, soft, and tender at the incision sites.  No  rebound or rigidity.  Positive bowel sounds.  EXTREMITIES:  No clubbing, cyanosis, or edema.  RECTAL:  Negative for any palpable masses, and there is some  hypopigmentation in the perianal area but no evidence of any masses and  is heme negative.   LABORATORY VALUES:  White blood cell count 6.0, hemoglobin 12.2, MCV is  93.1, and platelets at 182,000.  Sodium 136, potassium 3.7, chloride  101, CO2 27, glucose 134, BUN is 5, and  creatinine is 0.9.   IMPRESSION:  1. Mucoid diarrhea.  2. Status post symptomatic cholelithiasis.  3. Hypertension.  4. Sarcoidosis.   At this time, I am unable to identify the source of her complaints.  She  is responsive to antidiarrheals, but I do not feel that she needs to  stay in the hospital for further evaluation.  She is well at this time  and this may be a transient issue, but I feel further evaluation with a  flexible sigmoidoscopy may be warranted.  Given that she is status post  her cholecystectomy, she would like to  wait before undergoing any  further procedures at this time.  I will plan at this time to have the  patient discharged home, and I will see the patient in the office in 2  weeks.      Jordan Hawks Elnoria Howard, MD  Electronically Signed     PDH/MEDQ  D:  08/16/2008  T:  08/17/2008  Job:  [id number]   cc:   Osvaldo Shipper. Spruill, M.D.  Timothy E. Earlene Plater, M.D.

## 2011-04-30 NOTE — Op Note (Signed)
[name], [name]               ACCOUNT NO.:  [id number]   MEDICAL RECORD NO.:  [id number]          PATIENT TYPE:  INP   LOCATION:  6732                         FACILITY:  [facility]   PHYSICIAN:  Adolph Pollack, M.D.DATE OF BIRTH:  01-Aug-1956   DATE OF PROCEDURE:  08/14/2008  DATE OF DISCHARGE:                               OPERATIVE REPORT   PREOPERATIVE DIAGNOSIS:  Acute cholecystitis.   POSTOPERATIVE DIAGNOSIS:  Subacute cholecystitis.   PROCEDURE:  Laparoscopic cholecystectomy and intraoperative  cholangiogram.   SURGEON:  Adolph Pollack, MD   ASSISTANT:  Anselm Pancoast. Zachery Dakins, MD   ANESTHESIA:  General.   INDICATION:  This is a 55 year old female who presented to the hospital  and was admitted on August 11, 2008, with gallstones, elevated white  count, right upper quadrant tenderness, and clinical findings consistent  with acute cholecystitis.  She also had a Gram-positive bacteremia.  She  has been treated with antibiotics and is now brought to the operating  room for cholecystectomy.  Procedure and risks were discussed with her  preoperatively.   TECHNIQUE:  She was brought to the operating room and placed supine on  the operating table and a general anesthetic was administered.  The  abdominal wall was sterilely prepped and draped.  Marcaine was  infiltrated in the subumbilical region.  A small subumbilical incision  was made through the skin, subcutaneous tissue, fascia, and peritoneum  and entered the peritoneal cavity under direct vision.  A purse-string  suture of 0 Vicryl was placed around the fascial edges.  A Hasson trocar  was introduced into the peritoneal cavity and the pneumoperitoneum was  created by insufflation of CO2 gas.   Following this, the laparoscope was introduced.  She was placed in the  reverse Trendelenburg position and the right side was tilted up.  There  were omental adhesions of the gallbladder, which appeared to be somewhat  edematous.  An 11-mm trocar was placed in the epigastrium and two 5-mm  trocars were placed in the right upper quadrant.  The fundus of the  gallbladder was grasped and using blunt dissection, omental adhesions  were easily dissected off the gallbladder.  The fundus was then  retracted towards the right shoulder.  The infundibulum was then  mobilized with dissection close to the gallbladder.  I then isolated the  cystic duct, which appeared to be somewhat dilated and I could notice a  stone in the cystic duct obstructing it.  I made a window around the  cystic duct.  A clip was placed at the cystic duct gallbladder junction.  A small incision was made in the cystic duct.  I was able to crush the  stone and milk some of it out.  I then placed a Reddick  cholangiocatheter through the abdominal wall.  I then put into the  cystic duct and inflated the balloon and flushed and pulled back the  balloon until I got all of the stone debrided out of the cystic duct.  A  cholangiogram was then performed.   Under real time fluoroscopy, dilute contrast was injected into  the  cystic duct, which was of long length.  The common hepatic, right and  left hepatic, and common bile ducts all filled.  The contrast drained  promptly into the duodenum.  I did not see obvious evidence of  obstruction.  Final reports pending the radiologist interpretation.   The cholangiocatheter was removed, the cystic duct was clipped 3 times  proximally and divided.  I then identified anterior and posterior  branches of the cystic artery close to the gallbladder.  Windows were  created around these and they were clipped and divided.  Using  electrocautery, the gallbladder was dissected free from the liver intact  and placed in an Endopouch bag.   The gallbladder fossa was copiously irrigated.  Bleeding points were  controlled with electrocautery.  It was irrigated and inspected again  and there was no bleeding or bile leak.   Irrigation fluid was evacuated  as much as possible.   The gallbladder was then removed from the Endopouch bag through the  subumbilical port and the subumbilical fascial defect was closed under  laparoscopic vision by tightening up and tying down the purse-string  suture.  The remaining trocars were removed and pneumoperitoneum was  released.  Skin incisions were closed with 4-0 Monocryl subcuticular  stitches.  Steri-Strips and sterile dressings were applied.   She tolerated the procedure well without any apparent complications and  was taken to recovery in satisfactory condition.      Adolph Pollack, M.D.  Electronically Signed     TJR/MEDQ  D:  08/14/2008  T:  08/15/2008  Job:  [id number]

## 2011-05-03 NOTE — Op Note (Signed)
[name], [name]               ACCOUNT NO.:  [id number]   MEDICAL RECORD NO.:  [id number]          PATIENT TYPE:  AMB   LOCATION:  DAY                          FACILITY:  [facility]   PHYSICIAN:  Sharlet Salina T. Hoxworth, M.D.DATE OF BIRTH:  [birth year]   DATE OF PROCEDURE:  10/16/2004  DATE OF DISCHARGE:                                 OPERATIVE REPORT   PREOPERATIVE DIAGNOSIS:  History of squamous cell carcinoma of the anus.   POSTOPERATIVE DIAGNOSIS:  History of squamous cell carcinoma of the anus.   SURGICAL PROCEDURES:  1.  Removal of Port-A-Cath.  2.  Exam under anesthesia and multiple anal skin biopsies.   SURGEON:  Dr. Johna Sheriff.   ANESTHESIA:  General endotracheal.   BRIEF HISTORY:  [name] is a 55 year old female with a history of  squamous cancer of the anus status post chemoradiation therapy. She is now  brought to the operating room for planned exam under anesthesia and anal  biopsies and follow up and also for removal of her Port-A-Cath. The nature  of the procedure, risks of bleeding and infection were discussed and  understood.   DESCRIPTION OF PROCEDURE:  The patient was brought to the operating room and  placed supine position on the operating table, and general endotracheal  anesthesia was induced. She was carefully position in lithotomy position  stirrups. Initially, the left anterior chest was sterilely prepped and  draped at the site of the Port-A-Cath. The previous scar was excised.  Dissection was carried down through the subcu to the port which was  dissected from surrounding tissue and the catheter slipped out intact. The  sutures underneath the port were divided, and it was completely removed.  Subcu was reapproximated with a couple of 3-0 Vicryl sutures, and the skin  closed with running subcuticular 4-0 Monocryl and Steri-Strips. Following  this, the perineum was sterilely prepped and draped. The anus was gently  dilated 2 fingers. The retractor  was placed. Careful examination was  performed of the anorectum and perianal skin. The rectal mucosa and area of  the dentate line actually appeared completely normal. There were changes of  the perianal skin from the anal verge out for a couple of centimeters with  some deep pigmentation and slight thickening of the anal skin. There was one  raised warty excrescence about 4 mm in diameter in the left posterior area.  There were a few other slightly nodular areas of skin thickening in the left  posterior, right posterior, and the right anterior perianal skin. Multiple  small skin biopsies were obtained of all of these areas and appropriately  labeled. All abnormal areas were sampled. Hemostasis was obtained with  cautery. Perianal block with 30 cc of Marcaine with epinephrine was  performed. Hemostasis was assured. Gauze dressing was applied, and the  patient taken to the recovery room in good condition.    BTH/MEDQ  D:  10/16/2004  T:  10/16/2004  Job:  [id number]

## 2011-05-03 NOTE — Op Note (Signed)
[name], [name]                         ACCOUNT NO.:  [id number]   MEDICAL RECORD NO.:  [id number]                   PATIENT TYPE:  AMB   LOCATION:  DAY                                  FACILITY:  [facility]   PHYSICIAN:  Timothy E. Earlene Plater, M.D.              DATE OF BIRTH:  [birth year]   DATE OF PROCEDURE:  03/27/2004  DATE OF DISCHARGE:                                 OPERATIVE REPORT   PREOPERATIVE DIAGNOSES:  Squamous cell carcinoma of the anus.   POSTOPERATIVE DIAGNOSES:  Squamous cell carcinoma of the anus.   PROCEDURE:  Examination under anesthesia, multiple perineal biopsies,  flexible sigmoidoscopy and placement of Port-A-Cath.   SURGEON:  Timothy E. Earlene Plater, M.D.   ANESTHESIA:  General.   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 Donovan Kail. She is known to have vulvar and perineal  irritation and warty lesions.  She has been appraised of the diagnosis, she  has seen Dr. Pierce Crane and will see Dr. Dorna Bloom in plans for combined  chemotherapy and radiation.  Specifically, Dr. Donnie Coffin suggests that the  entire lesion of the right posterior perianal area need not be excised as  this will be adequately treated with chemoradiation.  The patient agrees and  understands.  Likewise a Port-A-Cath will be needed for chemotherapy.  The  patient was seen, identified and the permit signed.   She was taken to the operating room, placed supine LMA anesthesia provided.  The upper chest and neck were prepped and draped in the usual fashion with  the patient in the head down position, the left subclavian vein was isolated  on the second pass without complications, guidewire introduced and  throughout real-time fluoroscopy used for proper placement.  Likewise a  0.25% Marcaine with epinephrine was used for local anesthesia. The puncture  site was enlarged, the catheter sheath was placed over the guidewire.  The  guidewire removed, the  Port-A-Cath catheter then introduced through the  sheath. It would not place exactly correct so the guidewire was replaced  into the catheter device and gently manipulated into the proper position.  The sheath and guidewire removed, the catheter remained in good position.  Then a pocket was made below the puncture site for the port.  The catheter  was passed subcutaneously from the entrance site to the port site, connected  and flushed easily and the final position of the catheter was ascertained  with fluoroscopy.  The port was secured to the prepectoral fascia with 2-0  Prolene. The port site was closed was layers of 4-0 Monocryl. Steri-Strips  applied. The exact port site drawn on the skin and then the entire area  covered with a Tegaderm.  The patient was stable. She was then carefully  repositioned and placed in lithotomy and the entire perineum prepped and  draped. Careful exam revealed squamous change on either  side of the vaginal  entrance in the labial area likewise there was a large warty lesion on the  mons pubis and there were several questionable areas around the anal rectum.  Six specimens were taken, carefully labeled and sent separately.  The warty  lesion of the mons pubis was removed, biopsies were made in the right and  left labia.  Biopsies were made of the right posterior anoderm, the  posterior anoderm and the left lateral perianal skin.  Each of these biopsy  sites were cauterized and the wounds were satisfactory. Flexible  sigmoidoscope introduced and passed to 40 cm, the prep was poor but with  proper positioning and irrigation and insufflation, the mucosa could be seen  adequately.  No gross lesions were seen; however, minute lesions could not  be excluded.  The entire exam was otherwise within normal limits.  This  procedure was completed, dressings were applied to the perineum.  The  patient was awakened, extubated and taken to the recovery room in good   condition.  A portable chest x-ray will be made and the patient will be  followed as an outpatient.  Prescriptions were given for 1 mg of Coumadin to  take daily and Vicodin to take as needed.                                               Timothy E. Earlene Plater, M.D.    TED/MEDQ  D:  03/27/2004  T:  03/27/2004  Job:  [id number]   cc:   Pierce Crane, M.D.  [address] - [facility]  [place]  [place] [address]  Fax: [phone]   Elmer Sow. Dorna Bloom, M.D.  [address] - [facility]  [place]  [place] [address]  Fax: [phone]   Miguel Aschoff, M.D.  [address], [address]  [place]  [place] [address]  Fax: [phone]   Osvaldo Shipper. Spruill, M.D.  [address] [address]  [place]  [place] [address]  Fax: [phone]

## 2011-05-03 NOTE — H&P (Signed)
NAME:  [name], [name]                         ACCOUNT NO.:  [id number]   MEDICAL RECORD NO.:  [id number]                   PATIENT TYPE:  INP   LOCATION:                                       FACILITY:  [facility]   PHYSICIAN:  [name], M.D.                   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  Her last 5-FU pump  was initiated on May 28, 2004.  She was due for de-access on June 01, 2004.  She presented to the radiation oncology department this a.m. with reported  temperature to 100.4 this morning while at home.  Radiation therapy was  given, but after physical examination was noted with significant radiation  changes in the anal and perineal region, treatment for June 01, 2004, was to  be held.  She has been on Diflucan 100 mg p.o. daily with vaginal having  been obtained just today.  She was then assessed in medical oncology.  She  was noted to have a temperature of 101.6.  Her counts were adequate.  She  was not neutropenic, but due to her history of sarcoid and her generalized  malaise, it was felt prudent for hospitalization to include work-up  including chest x-ray, PA and lateral.  Blood cultures x2 were obtained  after pump was de-accessed one via port and one peripherally.  She will  start IV antibiotic coverage of Levaquin 500 mg IV q.24h., IV fluid support  and further assessment.   PAST MEDICAL HISTORY:  1. Hypertension, on Diovan and Norvasc.  2. History of sarcoidosis with liver, pulmonary and ocular involvement.  3. History of iron-deficiency anemia.   PAST SURGICAL HISTORY:  C. sections x2 16 and 19 years ago.   ALLERGIES:  PENICILLIN allergy causes urticaria.   CURRENT MEDICATIONS:  1. Diovan 160/12.5 two p.o. daily.  2. Norvasc 5 mg p.o. daily.  3. Diflucan 100 mg p.o. daily.  4. OxyContin 20 mg p.o. q.12h.  5. Oxycodone for breakthrough pain.  6. Compazine 10 mg p.o. q.6-8h. p.r.n. nausea and vomiting.   FAMILY HISTORY:  Family history of hypertension, diabetes and arthritis.  Remote family history of parotid tumor.  She has two brothers and a sister  who are alive and well.  She has a 59 year old daughter, [name]; a 19-year-  old son, both are healthy.   SOCIAL HISTORY:  Ms. Hegeman resides here in [place], [place].  She has never been married.  There is no history of tobacco or alcohol  consumption.  She works for [employer], but has not  been working for duration of her  treatment at this time.   REVIEW OF SYMPTOMS:  As per HPI, to include, she denies any headaches, sinus  congestion, sore throat, nausea, emesis, diarrhea or constipation issues.  She denies any shortness of breath, chest pain or productive cough. She does  describe some dysuria symptoms but feels it is secondary to XRT.   LABORATORY DATA:  Hemoglobin 10.2 g, platelet count 209,000, wbc 4700 with  ANC of 4000.  BMET pending.   IMPRESSION/PLAN:  1. Febrile illness with malaise, case has been reviewed with Dr. Donnie Coffin.     Given her prior history also of sarcoid with other tissue involvement,     i.e., lung, liver and ocular, felt prudent for hospitalization.  We will     de-access her 5-FU pump today.  Will obtain cultures, one via port and     one peripherally.  She will start Levaquin 500 mg IV.  This can be     switched over to p.o. if she is feeling better.  Vaginal cultures have     already been obtained.  We will await these results.  She will continue     her oral Diflucan.  She will continue her current oral analgesic regimen     of OxyContin with oxycodone for breakthrough pain.  2. Hypertension.  She will continue on her oral  Diovan and Norvasc, though     we will monitor her pressures closely.  These can be held if she becomes     a bit hypotensive.  She will have IV fluid support.  3. Sarcoidosis.  We will obtain a chest x-ray, PA and lateral, today.     [name]. [name], M.D., is her pulmonologist. He can be called in if     needed.      [name], P.A.                [name], M.D.    CTS/MEDQ  D:  05/31/2004  T:  05/31/2004  Job:  [id number]

## 2011-05-03 NOTE — Op Note (Signed)
[name], [name]               ACCOUNT NO.:  [id number]   MEDICAL RECORD NO.:  [id number]          PATIENT TYPE:  AMB   LOCATION:  DSC                          FACILITY:  [facility]   PHYSICIAN:  Sandria Bales. Ezzard Standing, M.D.  DATE OF BIRTH:  06-08-1956   DATE OF PROCEDURE:  06/06/2005  DATE OF DISCHARGE:                                 OPERATIVE REPORT   PRE OP DIAGNOSIS:  Keloid right chest   POST OP DIAGNOSIS:  Keloid right chest   OPERATION:  Excision of Keloid of right chest (3.5 cm)   SURGEON:  Ovidio Kin, M.D.   ANESTHESIA:  20 cc 1% xylocaine   Indications for surgery:   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  She has had chemo  radiation.  Has done well from this.  At the Port-A-Cath in her left  subclavian area she has developed a keloid which is painful and hypertrophic  scar, and now comes for excision of this.  I have coordinated this with Dr.  Margaretmary Bayley.  I plan to excise this under local anesthesia.  Dr. Kathrynn Running  plans three intervals of radiation therapy.  The first this afternoon.   Indications and potential complications explained to the patient.  Potential  complications include infection of the wound, recurrence of the keloid.  The  patient understands this.   OPERATION:  The patient placed in supine position.  Her left subclavian area is prepped  with  Betadine solution and sterilely draped.  I went around and excised the  keloid trying to get all the hypertrophic scar that I could.  The original  keloid was approximately 3.5 cm in length.  My excision was probably a  length of 5 cm with a width of about 1 to 1.5 cm.   I closed the subcutaneous tissue were 3-0 Vicryl suture.  The skin with a  running 5-0 Monocryl suture.  I painted the wound with tincture of Benzoin  and Steri-Strip.   I gave her a prescription for Darvocet for pain.  She is to stay out of work  for a  week.  She will see me back in two to three weeks for wound check.  She is going over this afternoon to the radiation oncologist for radiation  of her wound.       DHN/MEDQ  D:  06/06/2005  T:  06/06/2005  Job:  [id number]   cc:   Artist Pais Kathrynn Running, M.D.  [address]- [facility]  [place]  [place] [address]  Fax: [phone]   Miguel Aschoff, M.D.  [address], [address]  [place]  [place] [address]  Fax: [phone]   Pierce Crane, M.D.  [address] [facility]  [place]  [place] [address]  Fax: [phone]

## 2011-05-03 NOTE — Op Note (Signed)
NAMEJAZZMYNE, [name]               ACCOUNT NO.:  [id number]   MEDICAL RECORD NO.:  [id number]          PATIENT TYPE:  AMB   LOCATION:  DSC                          FACILITY:  [facility]   PHYSICIAN:  Cindee Salt, M.D.       DATE OF BIRTH:  1956-05-22   DATE OF PROCEDURE:  12/17/2006  DATE OF DISCHARGE:                               OPERATIVE REPORT   PREOPERATIVE DIAGNOSIS:  Stenosing tenosynovitis, left thumb.   POSTOPERATIVE DIAGNOSIS:  Stenosing tenosynovitis, left thumb.   OPERATION:  Release A1 pulley, left thumb.   SURGEON:  Merlyn Lot, M.D.   ASSISTANT:  __________  .   ANESTHESIA:  Forearm based IV regional.   HISTORY:  The patient is a 55 year old female with a history of  triggering of her left thumb.  This has not responded to conservative  treatment.  She is desirous of release.  She is aware of risks and  complications including infection, recurrence, injury to arteries,  nerves, tendons, incomplete relief of symptoms, dystrophy in the  preoperative area.  Questions were encouraged and answered.  The  extremity marked by both the patient and surgeon.   PROCEDURE:  The patient was brought to the operating room where a  forearm based IV regional anesthetic was carried out without difficulty.  She was prepped using DuraPrep, supine position, left arm free.  A  transverse incision was made over the A1 pulley left thumb, carried down  through subcutaneous tissue.  Bleeders were electrocauterized.  The A1  pulley was identified, found to be thickened.  The radial ad ulnar  digital arteries and nerves were identified, protected.  An incision was  then made on the radial aspect of the A1 pulley.  The oblique pulley was  left intact.  On release an area of  compression to the tendon was  immediately apparent.  Finger was placed through full range motion; no  further triggering was noted.  The wound was irrigated, and the skin  closed with interrupted 5-0 nylon sutures.  Sterile  compressive dressing  was applied.  The patient tolerated the procedure well and was taken to  the recovery for observation in satisfactory condition.  She is  discharged home to return to the hand center in [place] in 1 week on  Vicodin.           ______________________________  Cindee Salt, M.D.     GK/MEDQ  D:  12/17/2006  T:  12/17/2006  Job:  [id number]   cc:   Cindee Salt, M.D.

## 2011-05-03 NOTE — Discharge Summary (Signed)
NAMEQUIN, MCPHERSON               ACCOUNT NO.:  [id number]   MEDICAL RECORD NO.:  [id number]          PATIENT TYPE:  INP   LOCATION:  6732                         FACILITY:  [facility]   PHYSICIAN:  Osvaldo Shipper. Spruill, M.D.DATE OF BIRTH:  Nov 24, 1956   DATE OF ADMISSION:  08/11/2008  DATE OF DISCHARGE:  08/16/2008                               DISCHARGE SUMMARY   DISCHARGE DIAGNOSES:  1. Cholelithiasis with acute cholecystitis.  2. Urinary tract infection.  3. Hypertension.  4. Sarcoidosis.  5. Diarrhea   CONSULTANT:  Adolph Pollack, MD   PROCEDURES:  Laparoscopic cholecystectomy and intraoperative  cholangiogram on August 14, 2008.   Ms. Siegmann is a 55 year old patient with a history of sarcoidosis,  hypertension, history of squamous cell carcinoma of the anus, and  history of urinary tract infection who presented to the emergency  department with right upper quadrant and right lower quadrant abdomen  pain with some nausea and vomiting.  The patient was evaluated in the  emergency department and was found to have normal hemoglobin and  hematocrit, normal white blood cell count.  Urine was positive for white  blood cells and bacteria.  The bilirubin was 0.8.  The alkaline phos was  120.  The SGPT and SGOT were both okay.  A CT scan of the abdomen and  pelvis revealed pericholecystic fluid and raised a question of acute  cholecystitis.  The patient was subsequently admitted for aggressive  treatment of this particular problem.   The patient was admitted to the Medical Service, was placed on IV  fluids, IV antibiotics, and abdominal ultrasound was obtained, and a  consultation with [facility] was also obtained.  The  ultrasound revealed numerous gallstones with a small contracted  gallbladder and no ductal dilatation.  The patient was seen by Sheralyn Boatman, nurse practitioner and Dr. Consuello Bossier with surgical team  and that it was their opinion that the  patient should proceed with a  laparoscopic cholecystectomy.   The patient underwent laparoscopic cholecystectomy without complication.   The patient developed positive blood culture and was placed on  vancomycin.   The patient developed some problems with diarrhea and a consultation was  obtained with Dr. Elnoria Howard, and after evaluation, the source of the  complaint was not able to be identified.  It was noted she was  responsive to the antidiarrheals and plans were made to continue the  workup as an outpatient.   On August 16, 2008, it was the opinion of the attending physician and  consulting physician that the patient could be discharged having  received maximum benefit from this hospitalization.   MEDICATIONS AT DISCHARGE:  1. Cipro twice a day for 4 days.  2. Benicar 40 mg daily.  3. Protonix 40 mg daily.   The patient was given surgical discharge instructions concerning fever,  redness, nausea, vomiting, or diarrhea.  The patient is to be seen by  Dr. Elnoria Howard in 2 weeks.  The patient is to be seen in the [facility] on August 30, 2008, and is to be seen in the  office in 2  weeks.      Ivery Quale, P.A.      Osvaldo Shipper. Spruill, M.D.  Electronically Signed    HB/MEDQ  D:  09/27/2008  T:  09/28/2008  Job:  [id number]

## 2011-05-03 NOTE — Discharge Summary (Signed)
[name], [name]                         ACCOUNT NO.:  [id number]   MEDICAL RECORD NO.:  [id number]                   PATIENT TYPE:  INP   LOCATION:  0367                                 FACILITY:  [facility]   PHYSICIAN:  Timothy E. Earlene Plater, M.D.              DATE OF BIRTH:  Aug 14, 1956   DATE OF ADMISSION:  03/27/2004  DATE OF DISCHARGE:  03/30/2004                                 DISCHARGE SUMMARY   FINAL DIAGNOSES:  1. Perioperative aspiration, pneumonitis.  2. History of sarcoid.  3. Obesity.  4. Squamous cell carcinoma of the anus.   The patient had been seen and evaluated preop, was scheduled for examination  under anesthesia, biopsy, and insertion of Port-A-Cath.  She had recently  discovered squamous cell carcinoma of the anus and was beginning to undergo  radiation and chemotherapy.  The patient was seen and evaluated preop, taken  to the operating room where, under general anesthesia, the above-named  procedure was accomplished.  In the immediate postoperative area, the  patient had fever, chills, tachycardia, and hyperthermia.  She was seen and  evaluated by anesthesia and then myself, and I called Dr. Sandrea Hughs, who  saw the patient immediately and thought that she had perioperative  aspiration pneumonitis on top of chronic but stable sarcoid disease.  He  managed the patient; she recovered very nicely and quickly.  Her laboratory  data was unremarkable.  He did check an angiotensin converting enzyme which  was 37 normal range.  He checked troponin and natriuretic peptide, both of  which were normal.  A blood culture was returned, staph species coag  negative.  Her cardiogram was essentially normal.  The patient was treated  with supplemental oxygen, pain medication, Cefotan, and was gradually weaned  off of the oxygen and did find with  satisfactory pulmonary function.  The patient was stable at discharge by Dr.  Sherene Sires on the 15th, would be seen and followed by him.   The other issues will  be addressed immediately including radiation and chemotherapy for her known  squamous cell carcinoma of the anus.                                               Timothy E. Earlene Plater, M.D.    TED/MEDQ  D:  04/13/2004  T:  04/13/2004  Job:  [id number]   cc:   Charlaine Dalton. Sherene Sires, M.D. [facility]   Pierce Crane, M.D.  [address] - [facility]  [place]  [place] [address]  Fax: [phone]   Elmer Sow. Dorna Bloom, M.D.  [address] - [facility]  [place]  [place] [address]  Fax: [phone]

## 2011-05-03 NOTE — Discharge Summary (Signed)
[name], [name]                         ACCOUNT NO.:  [id number]   MEDICAL RECORD NO.:  [id number]                   PATIENT TYPE:  INP   LOCATION:  0281                                 FACILITY:  [facility]   PHYSICIAN:  Pierce Crane, M.D.                   DATE OF BIRTH:  [birth year]   DATE OF ADMISSION:  06/03/2004  DATE OF DISCHARGE:  06/06/2004                                 DISCHARGE SUMMARY   DISCHARGE DIAGNOSES:  1. Anal canal cancer, on chemotherapy and radiation.  2. History of hypertension, on Diovan and Norvasc.  3. History of sarcoidosis.  4. History of iron deficiency anemia.   HISTORY OF PRESENT ILLNESS:  [name] is a 55 year old woman with a known  history of anal canal cancer, currently receiving 5-FU and mitomycin C with  chemotherapy.  She was admitted to the hospital with a low-grade fever and  failure to thrive.   On initial evaluation, the patient had a temperature of 101.2, pulse was 78,  respirations 16, and blood pressure 107/56.  Her oropharynx was normal.  Her  lungs were clear.  Heart sounds were normal.  No hepatomegaly or masses.  No  clubbing, cyanosis or edema.   HOSPITAL COURSE:  [name] was admitted to the hospital and placed on IV  antibiotics with IV Cipro and she was also placed on Diflucan.  She had been  having vaginal discharge and cultures were pending from that.  Initial labs  showed a white count of 3.7, hemoglobin 8.5, platelet count 202,000; sodium  128, creatinine 0.9.  She was given IV fluids and then she received 2 units  packed red blood cells.  On 06/03/04, her hemoglobin had risen to 10.4, white  count was 6.3.  At that time she was also started on Neupogen.  She  continued with IV antibiotics and Neupogen and improved.  She continued to  have some problems with constipation and was given MiraLax and subsequent to  that Lactulose with good results.  She continued radiation therapy while in-  house.  She continued to not  have any further problems.  On 06/05/04 her  white count was 6.5, hemoglobin 10.8 and platelet count 186,000.  Creatinine  was 0.7, potassium 4.5.  She was felt to be stable for discharge on 06/06/04.   DISCHARGE MEDICATIONS:  1. Norvasc 5 mg a day.  2. Diflucan 100 mg x7 days.  3. Diovan, dose as at home.  4. OxyContin 20 mg p.o. q.12h.  5. Oxycodone 5 mg 1-2 p.o. q.4h. p.r.n.  6. Cipro 500 mg b.i.d.  7. MiraLax 17 g per day.  8. Ativan 0.5 to 1 mg q.8h. p.r.n.   [name] will continue with her radiation therapy, she completed  chemotherapy last week.  She will be due to complete her radiation on  06/11/04.  She will return 06/11/04 for repeat blood work.  The patient was discharged from the hospital in stable condition.  Prognosis  is good.                                               Pierce Crane, M.D.    PR/MEDQ  D:  06/06/2004  T:  06/06/2004  Job:  [id number]   cc:   Sheppard Plumber. Earlene Plater, M.D.  [address] [place]  [place] [address]  Fax: [phone]   Artist Pais. Kathrynn Running, M.D.  [address] [facility]  [place]  [place] [address]  Fax: [phone]

## 2011-05-03 NOTE — Consult Note (Signed)
[name], [name]                         ACCOUNT NO.:  [id number]   MEDICAL RECORD NO.:  [id number]                   PATIENT TYPE:  INP   LOCATION:  0367                                 FACILITY:  [facility]   PHYSICIAN:  Charlaine Dalton. Sherene Sires, M.D. [facility]           DATE OF BIRTH:  June 30, 1956   DATE OF CONSULTATION:  03/27/2004  DATE OF DISCHARGE:                                   CONSULTATION   REASON FOR CONSULTATION:  Postop hypoxemia.   REFERRED BY:  Timothy E. Earlene Plater, M.D.   HISTORY:  This is a 55 year old black female remote smoker with a history of  hypertension, obesity with restrictive lung disease and probable sarcoid as  evidenced by hilar adenopathy with stable mild nodular lung disease that  dates back all the way to 39.  I have seen multiple times in the office  since initially evaluating her on October 29, 2003 with the finding of  mostly  obesity with secondary restricted physiology and deconditioning and  I did not feel active sarcoid was present.  I was asked to see her  postoperatively where general anesthesia was required for a rectal biopsy  and placement of  an indwelling supraclavicular catheter.  Apparently the  anesthesia went fine but postoperatively she developed fever, hypoxemia and  was planned to be an outpatient procedure has been converted over to an  inpatient procedure. The patient presently denies any form of chest pain  pleuritic or otherwise. She has a mild dry cough and states she is mildly  short of breath at rest but feels better with oxygen at 4 liters going and  saturating well.   I went through the patient's most recent history and found no history to  suggest any exacerbation of dyspnea, cough, fevers, chills or any systemic  complaints prior to coming in for surgery this morning. Specifically she  denies any active sinus symptoms, sore throat, overt reflux symptoms,  myalgias, arthralgia's or other articular complaints.   PAST MEDICAL  HISTORY:  She is suspected to have rectal cancer per Dr. Earlene Plater  but path is pending.  Significant for obesity and probable sarcoid (tissue  diagnosis never confirmed).  She also says she has a history of hypertension  and previous __________ surgery.   ALLERGIES:  PENICILLIN causes hives.   MEDICATIONS:  Limited to Diovan and albuterol (she actually does not  remember the last time she used albuterol).   SOCIAL HISTORY:  She quit smoking around 1985. She has worked in [place]  for [employer] in the past. She denies any unusual hobby, pet or  travel exposure.   FAMILY HISTORY:  Significant for allergies in mother, sister and brother,  negative for sarcoid, mother also has reported asthma.   REVIEW OF SYMPTOMS:  Taken in the detail essentially negative as already  outlined above.   PHYSICAL EXAMINATION:  GENERAL:  This pleasant black female who does not  appear toxic.  VITAL SIGNS:  Her Tmax, however, was 101, vital signs are unremarkable  otherwise with a pulse rate now only in the 80's.  HEENT:  Unremarkable. Pharynx clear.  NECK:  Supple without cervical adenopathy, tenderness.  Trachea midline.  LUNGS:  Lung fields reveal a few inspiratory greater than expiratory rhonchi  with a few crackles in the bases but no bronchial changes or wheezing. There  was a regular rate and rhythm without murmur, gallop, rub or increase in the  pulmonic __________.  ABDOMEN:  Obese but otherwise benign with no palpable organomegaly, masses,  or tenderness.  EXTREMITIES:  Warm without calf tenderness, cyanosis, clubbing or edema.   Chest x-ray and most recent CT scan from March 23, 2004 were reviewed and  indicated definite acute changes occurred postoperatively with bilateral air  space disease superimposed on chronic reticular nodular interstitial lung  disease.   The remainder of the lab studies are pending at the time of this dictation  but include BNP and postoperative cardiac  enzymes as well as EKG.  Preop EKG  appeared normal.   IMPRESSION:  Acute respiratory failure now hypoxemic postop in a patient  with restrictive physiology secondary to obesity and probable inactive  sarcoid dating all the way back to 1996 (suspicion supported strongly by her  recent CT scan on April 8 that shows no serial change compared to previous  study in 2001 consistent with burned out sarcoid).   The differential diagnosis includes aspiration perioperatively,  idiosyncratic reaction to medication, coincidental community acquired  pneumonia (which seems quite unlikely given the lack of a prodrome history  that I could obtain from the patient) or early sepsis of any cause, for  instance from rectal biopsy.  She actually does not appear toxic at all or  uncomfortable in the medical day care on my arrival and did not appear toxic  at all with smiling and without specific complaints other than being short  of breath which actually resolved by the time I arrived with the use of  nasal oxygen.   I would therefore simply recommend observing her overnight pending the  results of the above lab studies on nasal oxygen and titrater oxygen to  appropriate levels for it maintain a saturation greater than 94.  I would  also recommend telemetry and blood cultures, urine culture and start empiric  Cefotan to cover potential GI sources of sepsis since she underwent a rectal  biopsy today.                                               Charlaine Dalton. Sherene Sires, M.D. [facility]    MBW/MEDQ  D:  03/27/2004  T:  03/27/2004  Job:  [id number]   cc:   Osvaldo Shipper. Spruill, M.D.  [address] [address]  [place]  [place] [address]  Fax: [phone]   Sheppard Plumber. Earlene Plater, M.D.  [address] [place]  [place] [address]  Fax: [phone]

## 2011-05-03 NOTE — Op Note (Signed)
[name], [name]               ACCOUNT NO.:  [id number]   MEDICAL RECORD NO.:  [id number]          PATIENT TYPE:  AMB   LOCATION:  ENDO                         FACILITY:  [facility]   PHYSICIAN:  Sandria Bales. Ezzard Standing, M.D.  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   POSTOPERATIVE DIAGNOSIS:  History of squamous cell carcinoma of the anus,  radiation changes to distal 10 cm of rectum, otherwise negative colonoscopy.   OPERATIVE PROCEDURE:  Flexible colonoscopy.   SURGEON:  Sandria Bales. Ezzard Standing, M.D.   ANESTHESIA:  75 mg of Demerol, 6 mg of Versed.   COMPLICATIONS:  None.   INDICATIONS FOR PROCEDURE:  [name] is a 55 year old female who had a  squamous cell carcinoma of the anus diagnosed in the summer of 2005, has  completed chemo/radiation therapy supervised by Dr. Pierce Crane and Dr. Margaretmary Bayley.  She has had some rectal complaints with bleeding and has seen Dr.  Kendrick Ranch and now comes for attempted colonoscopy.  This is her first  colonoscopy.   The indications and potential complications were explained to the patient.  The potential complications include, but are not limited to, bleeding and  perforation of the bowel.   DESCRIPTION OF PROCEDURE:  The patient was in the left lateral decubitus  position.  She was monitored with pulse oximetry, EKG, blood pressure cuff  and had nasal O2 during the procedure.   She was initially given 50 mg of Demerol and 5 mg of Versed.  During the  procedure, an additional 25 mg of Demerol and 1 mg of Versed were given.  The Olympus colonoscope was passed without difficulty around to the cecum.  The ileocecal valve was identified.  The cecum, right colon, transverse  colon, left colon and sigmoid colon were all unremarkable.  I saw no mucosal  lesions, no  diverticula.   Approximately 10-12 cm from the anal verge I did see some neovascularization  of the rectum which would be consistent with her history of radiation  therapy, though I thought this was mild.  I retroflexed the scope within the  rectum, I saw no rectal lesions or masses.  Though she does have a somewhat  tender rectum on digital exam, I saw no fissures or abscesses.   She tolerated the procedure well, had essentially a normal colonoscopy  except for some radiation changes in her distal rectum.  I would recommend  repeat colonoscopy in five years unless she should have continued symptoms  or changes in bowel habits.      DHN/MEDQ  D:  01/07/2005  T:  01/07/2005  Job:  [id number]   cc:   Sheppard Plumber. Earlene Plater, M.D.  [address] [place]  [place] [address]  Fax: [phone]   Osvaldo Shipper. Spruill, M.D.  [address] [address]  [place]  [place] [address]  Fax: [phone]   Molli Hazard A. Kathrynn Running, M.D.  [address] [facility]  [place]  [place] [address]  Fax: [phone]   Pierce Crane, M.D.  [address] - [facility]  [place]  [place] [address]  Fax: [phone]

## 2011-05-03 NOTE — Op Note (Signed)
[name], POHL               ACCOUNT NO.:  [id number]   MEDICAL RECORD NO.:  [id number]          PATIENT TYPE:  AMB   LOCATION:  DAY                          FACILITY:  [facility]   PHYSICIAN:  Timothy E. Earlene Plater, M.D. DATE OF BIRTH:  September 28, 1956   DATE OF PROCEDURE:  10/10/2006  DATE OF DISCHARGE:                                 OPERATIVE REPORT   PREOPERATIVE DIAGNOSIS:  History of anal squamous cell carcinoma and skin  lesions anus.   POSTOPERATIVE DIAGNOSIS:  History of anal squamous cell carcinoma and skin  lesions anus.   PROCEDURE:  Examination under anesthesia, flexible sigmoidoscopy and  multiple biopsies.   SURGEON:  Timothy E. Earlene Plater, M.D.   ANESTHESIA:  Local standby.   [name] is 55 year old black female with a history of squamous cell  carcinoma of the anus who completed treatment in June 2005 with excision and  radiation and chemotherapy.  She has been followed intermittently but has  not had a biopsy in approximately the two year span.  When seen in my office  by me for the first last month, she had new anal lesions reminiscent of  squamous lesions and/or viral warts.  She was informed and biopsy and/or  excision recommended.  Also, she has failed to get a colonoscopy and so a  flexed sig at least is recommended at this time. She agrees and understand.  She said she is prepared for this.  Her other risk factors include a history  of sarcoid, hypertension, obesity.  The patient was seen, identified, and  the permit signed.   She is to the operating room where she placed herself in lithotomy. IV  sedation was given.  She was placed in the stirrups and the legs gently  elevated.  Inspection showed multiple diffuse squamous lesions of the  perianal skin at the anal verge and for distance of 2, 3, and 4 cm from the  anus.  The flexible sigmoidoscopy was accomplished first and with gentle  manipulation, a solid stool ball in the rectum was bypassed and I was able  to reach the left colon to 45 cm.  No lesions were seen in the upper colon.  The rectum was not clearly visualized because of the solid stool and the  procedure was completed.  Then, carefully anoscopy with magnification was  carried out.  Again, the lesions were diffuse and multiple. There was a  distinct lesion in the 4 o'clock position approximately 6-8 mm.  This was  excised and submitted.  At 4 o'clock, there were other diffuse lesions, one  at 9 o'clock, a generous biopsy was made.  Also at 6 o'clock right at the  anal verge, considerable thick tissue was seen and biopsied.  There were no  lesions on the anterior portion of the anus.  These areas of biopsy were  cauterized.  The procedure was complete.  There were no lesions in the  distal rectal mucosa or papillae.  Dibucaine ointment and cotton gauze was  applied.  She tolerated the procedure well.  Counts were correct.  She was  removed to  the recovery room.   Written and verbal instructions were given to her and her family including  Vicodin for pain and careful wound care instructions.  She will be followed  in the office.      Timothy E. Earlene Plater, M.D.  Electronically Signed     TED/MEDQ  D:  10/10/2006  T:  10/11/2006  Job:  [id number]   cc:   Pierce Crane, M.D.  Fax: [phone]   Osvaldo Shipper. Spruill, M.D.  Fax: [phone]

## 2011-06-16 ENCOUNTER — Emergency Department (HOSPITAL_COMMUNITY)
Admission: EM | Admit: 2011-06-16 | Discharge: 2011-06-16 | Disposition: A | Discharge status: Home / Self Care | Payer: BC Managed Care – PPO | Attending: Emergency Medicine | Admitting: Emergency Medicine

## 2011-06-16 ENCOUNTER — Telehealth: Payer: Self-pay | Admitting: Critical Care Medicine

## 2011-06-16 ENCOUNTER — Emergency Department (HOSPITAL_COMMUNITY): Payer: BC Managed Care – PPO

## 2011-06-16 DIAGNOSIS — J189 Pneumonia, unspecified organism: Secondary | ICD-10-CM | POA: Insufficient documentation

## 2011-06-16 DIAGNOSIS — R059 Cough, unspecified: Secondary | ICD-10-CM | POA: Insufficient documentation

## 2011-06-16 DIAGNOSIS — F17200 Nicotine dependence, unspecified, uncomplicated: Secondary | ICD-10-CM | POA: Insufficient documentation

## 2011-06-16 DIAGNOSIS — D869 Sarcoidosis, unspecified: Secondary | ICD-10-CM | POA: Insufficient documentation

## 2011-06-16 DIAGNOSIS — I10 Essential (primary) hypertension: Secondary | ICD-10-CM | POA: Insufficient documentation

## 2011-06-16 DIAGNOSIS — R0602 Shortness of breath: Secondary | ICD-10-CM | POA: Insufficient documentation

## 2011-06-16 DIAGNOSIS — R05 Cough: Secondary | ICD-10-CM | POA: Insufficient documentation

## 2011-06-16 NOTE — Telephone Encounter (Signed)
Pt dyspneic and coughingl I asked pt to go to ED

## 2011-06-21 ENCOUNTER — Encounter: Payer: Self-pay | Admitting: Internal Medicine

## 2011-06-24 ENCOUNTER — Ambulatory Visit (INDEPENDENT_AMBULATORY_CARE_PROVIDER_SITE_OTHER): Payer: BC Managed Care – PPO | Admitting: Internal Medicine

## 2011-06-24 ENCOUNTER — Encounter: Payer: Self-pay | Admitting: *Deleted

## 2011-06-24 ENCOUNTER — Encounter: Payer: Self-pay | Admitting: Internal Medicine

## 2011-06-24 DIAGNOSIS — R059 Cough, unspecified: Secondary | ICD-10-CM

## 2011-06-24 DIAGNOSIS — R058 Other specified cough: Secondary | ICD-10-CM | POA: Insufficient documentation

## 2011-06-24 DIAGNOSIS — R05 Cough: Secondary | ICD-10-CM

## 2011-06-24 DIAGNOSIS — D869 Sarcoidosis, unspecified: Secondary | ICD-10-CM

## 2011-06-24 NOTE — Progress Notes (Signed)
Subjective:     Patient ID: [name], female   DOB: 1956/06/18, 55 y.o.   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  Last chronic prednisone 04/15/08 with no flare cough no nodules (right leg and left hand and right arm)   November 24, 2008 ov abrupt onset 11/13/08 coughing and fever and chills and aches rx with abx and prednisone rx since 12/8 and by day of ov just bothered by cough.   December 26, 2008 worse day of ov with fever feels same as in past, each time in setting of having stopped prednisone in last 1 -2 weeks prior to ov, rec floor of 10 mg one half daily   February 20, 2009 ov no flares since last ov on Prednisone 10mg  one half daily rec one half every other day   Apr 17, 2009 on pred 10 one half every other day no flare of sob, ocular or articular co's, fever chills or sweats or unintended wt loss or nausea/anorexia. Did have flare of cough with pollen, now better. Try Prednisone every 3 rd day.   June 12, 2009 no symptoms on 2/3 days when doesn't take prednisone, no nausea, rash, cough, sob, articular or ocular c/o's. so stop prednisone > no trouble.   March 23, 2010 cc dry cough x 2months ever since the flu, day > night assoc with clear nasal drainage. no ocular or articular co's. rec  Prn acid suppression and gerd diet, no further prednisone needed   06/24/2011 ov/[name] post ER eval.  Acute onset cough 2 weeks before going to er  And rx with prednisone > back to baseline with last prednisone 7/5 and abx = zpack now no need for rescue even sleeping or early in am  Pt denies any significant sore throat, dysphagia, itching, sneezing,  nasal  congestion or excess/ purulent secretions,  fever, chills, sweats, unintended wt loss, pleuritic or exertional cp, hempoptysis, orthopnea pnd or leg swelling.    Also denies any obvious fluctuation of symptoms with weather or environmental changes or other aggravating or alleviating factors.          Allergies   1) ! 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  wt 216 November 24, 2008 > 220 December 26, 2008 >  230 February 10, 2010 >  222 06/24/2011   HEENT: nl dentition, turbinates, and orophanx. Nl external ear canals without cough reflex  Neck without JVD/Nodes/TM  Lungs minimal insp and exp rhonchi.  RRR no s3 or murmur or increase in P2  Abd soft  and benign with nl excursion in the supine position. No bruits or organomegaly  Ext warm without calf tenderness, cyanosis clubbing or edema  Skin warm and dry without lesions      cxr 06/16/2011  Minimal pneumonia or patchy scarring in the right lower lung  zone.  2. Stable mild chronic interstitial lung disease  Assessment:          Plan:

## 2011-06-24 NOTE — Assessment & Plan Note (Addendum)
Explained natural history of uri and why it's necessary in patients at risk to treat GERD aggressively  at least  short term   to reduce risk of evolving cyclical cough initially  triggered by epithelial injury and a heightened sensitivty to the effects of any upper airway irritants,  most importantly acid - related.  That is, the more sensitive the epithelium damaged for virus, the more the cough, the more the secondary reflux (especially in those prone to reflux) the more the irritation of the sensitive mucosa and so on in a cyclical pattern.    Each maintenance medication was reviewed in detail including most importantly the difference between maintenance and as needed and under what circumstances the prns are to be used.  Please see instructions for details which were reviewed in writing and the patient given a copy.    The proper method of use, as well as anticipated side effects, of this metered-dose inhaler are discussed and demonstrated to the patient. Improved to 75% with coaching

## 2011-06-24 NOTE — Patient Instructions (Signed)
GERD (REFLUX)  is an extremely common cause of respiratory symptoms, many times with no significant heartburn at all.    It can be treated with medication, but also with lifestyle changes including avoidance of late meals, excessive alcohol, smoking cessation, and avoid fatty foods, chocolate, peppermint, colas, red wine, and acidic juices such as orange juice.  NO MINT OR MENTHOL PRODUCTS SO NO COUGH DROPS  USE SUGARLESS CANDY INSTEAD (jolley ranchers or Stover's)  NO OIL BASED VITAMINS   At the onset of  Cough for any reason, short of breath go ahead and start Aciphex 20 mg Take 30-60 min before first meal of the day and pepcid 20 mg one at bedtime  Work on inhaler technique:  relax and gently blow all the way out then take a nice smooth deep breath back in, triggering the inhaler at same time you start breathing in.  Hold for up to 5 seconds if you can.  Rinse and gargle with water when done   If your mouth or throat starts to bother you,   I suggest you time the inhaler to your dental care and after using the inhaler(s) brush teeth and tongue with a baking soda containing toothpaste and when you rinse this out, gargle with it first to see if this helps your mouth and throat.      If you are satisfied with your treatment plan let your doctor know and he/she can either refill your medications or you can return here when your prescription runs out.     If in any way you are not 100% satisfied,  please tell us.  If 100% better, tell your friends!

## 2011-06-24 NOTE — Assessment & Plan Note (Signed)
cxr reviwed,  Clinical course also > to evidence at all to suggest active sarcoidosis here

## 2011-07-01 ENCOUNTER — Other Ambulatory Visit: Payer: Self-pay | Admitting: Internal Medicine

## 2011-07-01 ENCOUNTER — Emergency Department (HOSPITAL_COMMUNITY): Payer: BC Managed Care – PPO

## 2011-07-01 ENCOUNTER — Emergency Department (HOSPITAL_COMMUNITY)
Admission: EM | Admit: 2011-07-01 | Discharge: 2011-07-01 | Disposition: A | Discharge status: Home / Self Care | Payer: BC Managed Care – PPO | Attending: Emergency Medicine | Admitting: Emergency Medicine

## 2011-07-01 DIAGNOSIS — J984 Other disorders of lung: Secondary | ICD-10-CM | POA: Insufficient documentation

## 2011-07-01 DIAGNOSIS — R0602 Shortness of breath: Secondary | ICD-10-CM | POA: Insufficient documentation

## 2011-07-01 DIAGNOSIS — R509 Fever, unspecified: Secondary | ICD-10-CM | POA: Insufficient documentation

## 2011-07-01 DIAGNOSIS — D869 Sarcoidosis, unspecified: Secondary | ICD-10-CM | POA: Insufficient documentation

## 2011-07-01 DIAGNOSIS — R059 Cough, unspecified: Secondary | ICD-10-CM | POA: Insufficient documentation

## 2011-07-01 DIAGNOSIS — R05 Cough: Secondary | ICD-10-CM | POA: Insufficient documentation

## 2011-07-01 DIAGNOSIS — I10 Essential (primary) hypertension: Secondary | ICD-10-CM | POA: Insufficient documentation

## 2011-07-01 LAB — BASIC METABOLIC PANEL
GFR calc Af Amer: >60 mL/min (ref >60)
GFR calc non Af Amer: >60 mL/min (ref >60)
Potassium: 3.9 mEq/L (ref 3.5–5.1)
Sodium: 137 mEq/L (ref 135–145)

## 2011-07-01 LAB — DIFFERENTIAL
Basophils Absolute: 0 10*3/uL (ref 0.0–0.1)
Basophils Relative: 0 % (ref 0–1)
Eosinophils Relative: 2 % (ref 0–5)
Monocytes Absolute: 0.9 10*3/uL (ref 0.1–1.0)
Neutro Abs: 8.7 10*3/uL — ABNORMAL HIGH (ref 1.7–7.7)

## 2011-07-01 LAB — URINALYSIS, ROUTINE W REFLEX MICROSCOPIC
Bilirubin Urine: NEGATIVE
Glucose, UA: NEGATIVE mg/dL
Nitrite: NEGATIVE
Specific Gravity, Urine: 1.026 (ref 1.005–1.030)
pH: 5.5 (ref 5.0–8.0)

## 2011-07-01 LAB — URINE MICROSCOPIC-ADD ON

## 2011-07-01 LAB — CBC
MCHC: 36 g/dL (ref 30.0–36.0)
RDW: 13.9 % (ref 11.5–15.5)
WBC: 11.2 10*3/uL — ABNORMAL HIGH (ref 4.0–10.5)

## 2011-07-02 ENCOUNTER — Telehealth: Payer: Self-pay | Admitting: Internal Medicine

## 2011-07-02 NOTE — Telephone Encounter (Signed)
Pt says she was seen in [facility] ER yesterday for fever. CXR was done and she was given Avelox for 10 days. She said she was told she has pneumonia. Will forward to MW so he can review cxr. Pt would like to know if MW has any additional recs.

## 2011-07-02 NOTE — Telephone Encounter (Signed)
Reviewed > No convincing pneumonia but it's reasonable to finish the abx to see if helps symptoms (fever) and then return here at end of treatment for f/u but return sooner if condition worsens in interim

## 2011-07-02 NOTE — Telephone Encounter (Signed)
Pt is aware and will finish abx and then see MW on 07/18/2011 for follow-up. She will call if needing a sooner appt.  Pt is requesting a note to be out of work until the end of the week if MW will agree. She says she doesn't want to keep missing work or having to leave work. She has signed up for short term disability but this has not taken effect yet. Pls advise.

## 2011-07-10 NOTE — Telephone Encounter (Signed)
Spoke with pt. She states that her SOB and fever has returned, feels like she is getting sick again. OV with TP sched for tomorrow am at 9:15 am and I advised that if symptoms persist/worsen, needs to go to ER or UC tonight. Pt verbalized understanding.

## 2011-07-10 NOTE — Telephone Encounter (Signed)
PATIENT IS STARTING TO HAVE SOB AND FEVER AGAIN.

## 2011-07-11 ENCOUNTER — Ambulatory Visit (INDEPENDENT_AMBULATORY_CARE_PROVIDER_SITE_OTHER)
Admission: RE | Admit: 2011-07-11 | Discharge: 2011-07-11 | Disposition: A | Discharge status: Home / Self Care | Payer: BC Managed Care – PPO | Source: Ambulatory Visit | Attending: Adult Health | Admitting: Adult Health

## 2011-07-11 ENCOUNTER — Encounter: Payer: Self-pay | Admitting: Adult Health

## 2011-07-11 ENCOUNTER — Ambulatory Visit (INDEPENDENT_AMBULATORY_CARE_PROVIDER_SITE_OTHER): Payer: BC Managed Care – PPO | Admitting: Adult Health

## 2011-07-11 VITALS — BP 126/84 | HR 83 | Temp 98.8°F | Ht 65.5 in | Wt 219.8 lb

## 2011-07-11 DIAGNOSIS — D869 Sarcoidosis, unspecified: Secondary | ICD-10-CM

## 2011-07-11 MED ORDER — PREDNISONE 10 MG PO TABS: ORAL_TABLET | ORAL | Status: DC

## 2011-07-11 NOTE — Progress Notes (Signed)
Subjective:     Patient ID: [name], female   DOB: [birth year], 55 y.o.   MRN: [id number]  HPI   4 yobf minimal smoking hx admitted from 01/20/2008 through 01/27/2008 for evaluation of an FUO with cough and parenchymal pulmonary infiltrates which by transbronchial biopsy dated 2 /05/2008 represented noncaseating granulomatous inflammation. 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  Last chronic prednisone 04/15/08 with no flare cough no nodules (right leg and left hand and right arm)  November 24, 2008 ov abrupt onset 11/13/08 coughing and fever and chills and aches rx with abx and prednisone rx since 12/8 and by day of ov just bothered by cough.   December 26, 2008 worse day of ov with fever feels same as in past, each time in setting of having stopped prednisone in last 1 -2 weeks prior to ov, rec floor of 10 mg one half daily   February 20, 2009 ov no flares since last ov on Prednisone 10mg  one half daily rec one half every other day   Apr 17, 2009 on pred 10 one half every other day no flare of sob, ocular or articular co's, fever chills or sweats or unintended wt loss or nausea/anorexia. Did have flare of cough with pollen, now better. Try Prednisone every 3 rd day.   June 12, 2009 no symptoms on 2/3 days when doesn't take prednisone, no nausea, rash, cough, sob, articular or ocular c/o's. so stop prednisone > no trouble.   March 23, 2010 cc dry cough x 2months ever since the flu, day > night assoc with clear nasal drainage. no ocular or articular co's. rec  Prn acid suppression and gerd diet, no further prednisone needed   06/24/2011 ov/Wert post ER eval.  Acute onset cough 2 weeks before going to er  And rx with prednisone > back to baseline with last prednisone 7/5 and abx = zpack now no need for rescue even sleeping or early in am>>Rx Aciphex   07/11/2011 Acute OV  Pt complains of increased SOB, wheezing, dry cough,  fever - went to ED 7.16.12 and was told PNA had not cleared and given avelox x10d PT returns from ER follow up. Over last 3 weeks she has had persistent cough and intermittent fevers. Seen back in ER last week and given Avelox , cXR showed chronic vs acute on chronic changes in Right lung. She was given Avelox for 10 days. She is on 9/10 of Avelox. She is feeling better but cough is not gone. Had low grade  Fever yesterday. She has had mulitple  resp infection in past with reoccurence of cough and congestion. She denies dysphagia, reflux or vomitting. She did start on Aciphex last visit.  No discolored mucus or edema . Last steroids was 3 weeks ago, does better on prednisone with decreased cough.        Allergies   1) ! 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  HEENT:   No headaches,  Difficulty swallowing,  Tooth/dental problems, or  Sore throat,                No sneezing, itching, ear ache, nasal congestion, post nasal drip,   CV:  No chest pain,  Orthopnea, PND, swelling in lower extremities, anasarca, dizziness, palpitations, syncope.   GI  No heartburn, indigestion, abdominal pain, nausea, vomiting, diarrhea, change in bowel habits, loss of appetite, bloody stools.   resp:   No coughing up of blood.  No change in color of mucus.   No chest wall deformity  Skin: no rash or lesions.  GU: no dysuria, change in  color of urine, no urgency or frequency.  No flank pain, no hematuria   MS:  No joint pain or swelling.  No decreased range of motion.    Psych:  No change in mood or affect. No depression or anxiety.  No memory loss.         Objective:   Physical Exam Ambulatory mod obese bf in no acute distress.  wt 216 November 24, 2008 > 220 December 26, 2008 >  230 February 10, 2010 >  222 06/24/2011  >>219 07/11/2011  HEENT: poor  Dentition-several missing/broken teeth and caries , turbinates, and orophanx. Nl external ear canals without cough reflex  Neck without JVD/Nodes/TM  Lungs minimal insp and exp rhonchi.  RRR no s3 or murmur or increase in P2  Abd soft and benign with nl excursion in the supine position. No bruits or organomegaly  Ext warm without calf tenderness, cyanosis clubbing or edema  Skin warm and dry without lesions       Assessment:          Plan:

## 2011-07-11 NOTE — Patient Instructions (Signed)
Finish Avelox .  Begin Prednisone 20mg  daily for 4 days then 10mg  daily  Fluids and rest  I will call with xray results.  Need to make appointment with dentist.  Please contact office for sooner follow up if symptoms do not improve or worsen or seek emergency care  Follow up Dr. Sherene Sires  Next week as planned.

## 2011-07-11 NOTE — Assessment & Plan Note (Addendum)
Recurrent exacerbation w/ recent associated PNA. ? Chronic scarring vs superimposed infection  Will finish abx and trial of steroids  May need to evaluate for possible silent aspiration  Also needs some dental follow up   Plan:  Finish Avelox .  Begin Prednisone 20mg  daily for 4 days then 10mg  daily  Fluids and rest  I will call with xray results.  Need to make appointment with dentist.  Please contact office for sooner follow up if symptoms do not improve or worsen or seek emergency care  Follow up Dr. Sherene Sires  Next week as planned.

## 2011-07-18 ENCOUNTER — Encounter: Payer: Self-pay | Admitting: Internal Medicine

## 2011-07-18 ENCOUNTER — Ambulatory Visit (INDEPENDENT_AMBULATORY_CARE_PROVIDER_SITE_OTHER): Payer: BC Managed Care – PPO | Admitting: Internal Medicine

## 2011-07-18 ENCOUNTER — Other Ambulatory Visit: Payer: BC Managed Care – PPO

## 2011-07-18 ENCOUNTER — Encounter: Payer: Self-pay | Admitting: *Deleted

## 2011-07-18 VITALS — BP 134/86 | HR 86 | Temp 98.2°F | Ht 65.0 in | Wt 218.0 lb

## 2011-07-18 DIAGNOSIS — M899 Disorder of bone, unspecified: Secondary | ICD-10-CM

## 2011-07-18 DIAGNOSIS — M8580 Other specified disorders of bone density and structure, unspecified site: Secondary | ICD-10-CM

## 2011-07-18 DIAGNOSIS — D869 Sarcoidosis, unspecified: Secondary | ICD-10-CM

## 2011-07-18 MED ORDER — PREDNISONE (PAK) 10 MG PO TABS: ORAL_TABLET | ORAL | Status: AC

## 2011-07-18 NOTE — Patient Instructions (Addendum)
Sarcoidosis is a benign inflammatory condition caused by  The  immune system being too revved up like a thermostat on your furnace that's partially  stuck causing arthitis, rash, short of breath and cough and vision issues.  It typically burns itself out in 75% of patients by the end of 3 years with little to indicate that we really change the natural course of the disease by aggressive treatments intended to alter it.  Treatment is generally reserved for patients with major symptoms we can attribute to sarcoid or if a vital organ (like the eye or kidney or nervous system) becomes affected.    The goal with a chronic steroid dependent illness is always arriving at the lowest effective dose that controls the disease/symptoms and not accepting a set "formula" which is based on statistics or guidelines that don't always take into account patient  variability or the natural hx of the dz in every individual patient, which may well vary over time.  For now therefore I recommend the patient maintain  20 mg per day is the ceiling and once 100% better try 20mg  (pills) on even days and one on odd.  Continue aciphex Take 30-60 min before first meal of the day.  Please see patient coordinator before you leave today  to schedule bone densitometry  Please schedule a follow up office visit in 6 weeks, call sooner if needed

## 2011-07-18 NOTE — Assessment & Plan Note (Signed)
I had an extended discussion with the patient today lasting 15 to 20 minutes of a 25 minute visit on the following issues:   The goal with a chronic steroid dependent illness is always arriving at the lowest effective dose that controls the disease/symptoms and not accepting a set "formula" which is based on statistics or guidelines that don't always take into account patient  variability or the natural hx of the dz in every individual patient, which may well vary over time.  For now therefore I recommend the patient maintain  Ceiling of 20 mg and floor of 20 a/w 10 for now

## 2011-07-18 NOTE — Progress Notes (Signed)
Subjective:     Patient ID: [name], female   DOB: [birth year], 56 y.o.   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  Last chronic prednisone 04/15/08 with no flare cough no nodules (right leg and left hand and right arm)  November 24, 2008 ov abrupt onset 11/13/08 coughing and fever and chills and aches rx with abx and prednisone rx since 12/8 and by day of ov just bothered by cough.   December 26, 2008 worse day of ov with fever feels same as in past, each time in setting of having stopped prednisone in last 1 -2 weeks prior to ov, rec floor of 10 mg one half daily   February 20, 2009 ov no flares since last ov on Prednisone 10mg  one half daily rec one half every other day   Apr 17, 2009 on pred 10 one half every other day no flare of sob, ocular or articular co's, fever chills or sweats or unintended wt loss or nausea/anorexia. Did have flare of cough with pollen, now better. Try Prednisone every 3 rd day.   June 12, 2009 no symptoms on 2/3 days when doesn't take prednisone, no nausea, rash, cough, sob, articular or ocular c/o's. so stop prednisone > no trouble.   March 23, 2010 cc dry cough x 2months ever since the flu, day > night assoc with clear nasal drainage. no ocular or articular co's. rec  Prn acid suppression and gerd diet, no further prednisone needed   06/24/2011 ov/[name] post ER eval.  Acute onset cough 2 weeks before going to er  And rx with prednisone > back to baseline with last prednisone 7/5 and abx = zpack now no need for rescue even sleeping or early in am>>Rx Aciphex   07/11/2011 Acute OV  Pt complains of increased SOB, wheezing, dry cough,  fever - went to ED 7.16.12 and was told PNA had not cleared and given avelox x10d PT returns from ER follow up. Over last 3 weeks she has had persistent cough and intermittent fevers. Seen back in ER last week and given Avelox , cXR showed chronic vs acute on chronic changes in Right lung. She was given Avelox for 10 days. She is on 9/10 of Avelox. She is feeling better but cough is not gone. Had low grade  Fever yesterday. She has had mulitple  resp infection in past with reoccurence of cough and congestion. She denies dysphagia, reflux or vomitting. She did start on Aciphex last visit.  No discolored mucus or edema . Last steroids was 3 weeks ago, does better on prednisone with decreased cough.  rec Finish Avelox .  Begin Prednisone 20mg  daily for 4 days then 10mg  daily    07/18/2011 f/u ov/[name] cc persistent fever to 100.1  Better with aleve, cough gone, minimal arthralgias ankles. No rash or ocular c/os  Pt denies any significant sore throat, dysphagia, itching, sneezing,  nasal congestion or excess/ purulent secretions,  fever, chills, sweats, unintended wt loss, pleuritic or exertional cp, hempoptysis, orthopnea pnd or leg swelling.    Also denies any obvious fluctuation of symptoms with weather or environmental changes or other aggravating or alleviating factors.  Allergies   1) ! 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  wt 216 November 24, 2008 > 220 December 26, 2008 >  230 February 10, 2010 > 219 07/11/2011 > 07/18/2011  218 HEENT: poor  Dentition-several missing/broken teeth and caries , turbinates, and orophanx. Nl external ear canals without cough reflex  Neck without JVD/Nodes/TM  Lungs minimal insp and exp rhonchi.  RRR no s3 or murmur or increase in P2  Abd soft and benign with nl excursion in the supine position. No bruits or organomegaly  Ext warm without calf tenderness, cyanosis clubbing or edema  Skin warm and dry without lesions       Assessment:          Plan:

## 2011-07-19 ENCOUNTER — Ambulatory Visit (INDEPENDENT_AMBULATORY_CARE_PROVIDER_SITE_OTHER)
Admission: RE | Admit: 2011-07-19 | Discharge: 2011-07-19 | Disposition: A | Discharge status: Home / Self Care | Payer: BC Managed Care – PPO | Source: Ambulatory Visit

## 2011-07-19 DIAGNOSIS — D869 Sarcoidosis, unspecified: Secondary | ICD-10-CM

## 2011-07-19 DIAGNOSIS — M899 Disorder of bone, unspecified: Secondary | ICD-10-CM

## 2011-08-20 ENCOUNTER — Other Ambulatory Visit: Payer: Self-pay | Admitting: Obstetrics and Gynecology

## 2011-08-23 ENCOUNTER — Emergency Department (HOSPITAL_COMMUNITY)
Admission: EM | Admit: 2011-08-23 | Discharge: 2011-08-23 | Disposition: A | Discharge status: Home / Self Care | Payer: BC Managed Care – PPO | Attending: Emergency Medicine | Admitting: Emergency Medicine

## 2011-08-23 DIAGNOSIS — Z85048 Personal history of other malignant neoplasm of rectum, rectosigmoid junction, and anus: Secondary | ICD-10-CM | POA: Insufficient documentation

## 2011-08-23 DIAGNOSIS — R21 Rash and other nonspecific skin eruption: Secondary | ICD-10-CM | POA: Insufficient documentation

## 2011-08-23 DIAGNOSIS — K625 Hemorrhage of anus and rectum: Secondary | ICD-10-CM | POA: Insufficient documentation

## 2011-08-23 DIAGNOSIS — Z923 Personal history of irradiation: Secondary | ICD-10-CM | POA: Insufficient documentation

## 2011-08-23 DIAGNOSIS — D869 Sarcoidosis, unspecified: Secondary | ICD-10-CM | POA: Insufficient documentation

## 2011-08-23 DIAGNOSIS — I10 Essential (primary) hypertension: Secondary | ICD-10-CM | POA: Insufficient documentation

## 2011-08-23 DIAGNOSIS — Z9221 Personal history of antineoplastic chemotherapy: Secondary | ICD-10-CM | POA: Insufficient documentation

## 2011-09-06 ENCOUNTER — Other Ambulatory Visit: Payer: Self-pay | Admitting: Obstetrics and Gynecology

## 2011-09-06 LAB — LIPID PANEL
Cholesterol: 213 — ABNORMAL HIGH
LDL Cholesterol: 140 — ABNORMAL HIGH
Total CHOL/HDL Ratio: 11.8

## 2011-09-06 LAB — CBC
HCT: 32.7 — ABNORMAL LOW
Hemoglobin: 11.6 — ABNORMAL LOW
Hemoglobin: 11.6 — ABNORMAL LOW
Hemoglobin: 12.6
MCHC: 34.8
MCHC: 35.5
MCHC: 35.6
MCV: 92.7
MCV: 93.1
MCV: 92.6
Platelets: 289
RBC: 3.52 — ABNORMAL LOW
RBC: 3.53 — ABNORMAL LOW
RBC: 3.81 — ABNORMAL LOW
RDW: 13.3
RDW: 13.5
WBC: 9.5

## 2011-09-06 LAB — COMPREHENSIVE METABOLIC PANEL
ALT: 36 — ABNORMAL HIGH
AST: 33
CO2: 24
Chloride: 100
Creatinine, Ser: 0.88
GFR calc Af Amer: >60
GFR calc non Af Amer: >60
Sodium: 136
Total Bilirubin: 1.7 — ABNORMAL HIGH

## 2011-09-06 LAB — SEDIMENTATION RATE: Sed Rate: 69 — ABNORMAL HIGH

## 2011-09-06 LAB — URINE CULTURE
Colony Count: NO GROWTH
Colony Count: NO GROWTH
Culture: NO GROWTH
Culture: NO GROWTH

## 2011-09-06 LAB — PROTEIN ELECTROPH W RFLX QUANT IMMUNOGLOBULINS
Alpha-1-Globulin: 5.8 — ABNORMAL HIGH
Alpha-2-Globulin: 8.5
Beta 2: 5.9
Beta Globulin: 7
Gamma Globulin: 21.2 — ABNORMAL HIGH
M-Spike, %: NOT DETECTED

## 2011-09-06 LAB — FUNGUS CULTURE W SMEAR: Fungal Smear: NONE SEEN

## 2011-09-06 LAB — BASIC METABOLIC PANEL
CO2: 24
CO2: 26
CO2: 28
Calcium: 8.9
Calcium: 9.5
Chloride: 101
Chloride: 103
Creatinine, Ser: 0.76
Creatinine, Ser: 0.9
Creatinine, Ser: 1.34 — ABNORMAL HIGH
GFR calc Af Amer: 51 — ABNORMAL LOW
GFR calc Af Amer: >60
GFR calc non Af Amer: >60
Glucose, Bld: 117 — ABNORMAL HIGH
Glucose, Bld: 124 — ABNORMAL HIGH
Potassium: 3.3 — ABNORMAL LOW

## 2011-09-06 LAB — URINE MICROSCOPIC-ADD ON

## 2011-09-06 LAB — URINALYSIS, ROUTINE W REFLEX MICROSCOPIC
Leukocytes, UA: NEGATIVE
Nitrite: POSITIVE — AB
Protein, ur: 30 — AB
Specific Gravity, Urine: 1.02
Urobilinogen, UA: 1

## 2011-09-06 LAB — HEPATITIS B SURFACE ANTIGEN: Hepatitis B Surface Ag: NEGATIVE

## 2011-09-06 LAB — RPR: RPR Ser Ql: NONREACTIVE

## 2011-09-06 LAB — CULTURE, BLOOD (ROUTINE X 2)

## 2011-09-06 LAB — VARICELLA ZOSTER ANTIBODY, IGG: Varicella IgG: 2.01 — ABNORMAL HIGH

## 2011-09-06 LAB — DIFFERENTIAL
Basophils Absolute: 0
Basophils Absolute: 0
Basophils Relative: 0
Eosinophils Absolute: 0.3
Eosinophils Absolute: 0.1
Eosinophils Relative: 1
Monocytes Absolute: 0.9
Monocytes Relative: 13 — ABNORMAL HIGH
Neutrophils Relative %: 66

## 2011-09-06 LAB — AFB CULTURE WITH SMEAR (NOT AT ARMC)

## 2011-09-06 LAB — HEPATITIS B SURFACE ANTIBODY,QUALITATIVE: Hep B S Ab: NEGATIVE

## 2011-09-06 LAB — HSV 1 ANTIBODY, IGG: HSV 1 Glycoprotein G Ab, IgG: 3.13 — ABNORMAL HIGH

## 2011-09-06 LAB — CYTOMEGALOVIRUS PCR, QUALITATIVE: Cytomegalovirus DNA: NOT DETECTED

## 2011-09-06 LAB — CULTURE, RESPIRATORY W GRAM STAIN

## 2011-09-06 LAB — RHEUMATOID FACTOR: Rhuematoid fact SerPl-aCnc: <20

## 2011-09-06 LAB — ANA: Anti Nuclear Antibody(ANA): NEGATIVE

## 2011-09-06 LAB — INFLUENZA A+B VIRUS AG-DIRECT(RAPID)

## 2011-09-06 LAB — VARICELLA ZOSTER ANTIBODY, IGM: Varicella-Zoster Ab, IgM: <0.91 Index (ref <0.91)

## 2011-09-09 ENCOUNTER — Encounter: Payer: Self-pay | Admitting: Adult Health

## 2011-09-09 ENCOUNTER — Ambulatory Visit (INDEPENDENT_AMBULATORY_CARE_PROVIDER_SITE_OTHER): Payer: BC Managed Care – PPO | Admitting: Adult Health

## 2011-09-09 ENCOUNTER — Encounter (INDEPENDENT_AMBULATORY_CARE_PROVIDER_SITE_OTHER): Payer: BC Managed Care – PPO | Admitting: Cardiology

## 2011-09-09 VITALS — BP 118/70 | HR 87 | Temp 97.5°F | Ht 65.5 in | Wt 221.6 lb

## 2011-09-09 DIAGNOSIS — M79609 Pain in unspecified limb: Secondary | ICD-10-CM

## 2011-09-09 DIAGNOSIS — M79606 Pain in leg, unspecified: Secondary | ICD-10-CM

## 2011-09-09 DIAGNOSIS — M7989 Other specified soft tissue disorders: Secondary | ICD-10-CM

## 2011-09-09 DIAGNOSIS — R609 Edema, unspecified: Secondary | ICD-10-CM

## 2011-09-09 DIAGNOSIS — R600 Localized edema: Secondary | ICD-10-CM | POA: Insufficient documentation

## 2011-09-09 MED ORDER — CEPHALEXIN 500 MG PO CAPS: 500.0000 mg | ORAL_CAPSULE | Freq: Four times a day (QID) | ORAL | Status: AC

## 2011-09-09 NOTE — Progress Notes (Signed)
Subjective:     Patient ID: [name], female   DOB: 1956/04/22, 55 y.o.   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  Last chronic prednisone 04/15/08 with no flare cough no nodules (right leg and left hand and right arm)  November 24, 2008 ov abrupt onset 11/13/08 coughing and fever and chills and aches rx with abx and prednisone rx since 12/8 and by day of ov just bothered by cough.   December 26, 2008 worse day of ov with fever feels same as in past, each time in setting of having stopped prednisone in last 1 -2 weeks prior to ov, rec floor of 10 mg one half daily   February 20, 2009 ov no flares since last ov on Prednisone 10mg  one half daily rec one half every other day   Apr 17, 2009 on pred 10 one half every other day no flare of sob, ocular or articular co's, fever chills or sweats or unintended wt loss or nausea/anorexia. Did have flare of cough with pollen, now better. Try Prednisone every 3 rd day.   June 12, 2009 no symptoms on 2/3 days when doesn't take prednisone, no nausea, rash, cough, sob, articular or ocular c/o's. so stop prednisone > no trouble.   March 23, 2010 cc dry cough x 2months ever since the flu, day > night assoc with clear nasal drainage. no ocular or articular co's. rec  Prn acid suppression and gerd diet, no further prednisone needed   06/24/2011 ov/Wert post ER eval.  Acute onset cough 2 weeks before going to er  And rx with prednisone > back to baseline with last prednisone 7/5 and abx = zpack now no need for rescue even sleeping or early in am>>Rx Aciphex   07/11/2011 Acute OV  Pt complains of increased SOB, wheezing, dry cough,  fever - went to ED 7.16.12 and was told PNA had not cleared and given avelox x10d PT returns from ER follow up. Over last 3 weeks she has had persistent cough and intermittent fevers. Seen back in ER last week and given Avelox , cXR showed chronic vs acute on chronic changes in Right lung. She was given Avelox for 10 days. She is on 9/10 of Avelox. She is feeling better but cough is not gone. Had low grade  Fever yesterday. She has had mulitple  resp infection in past with reoccurence of cough and congestion. She denies dysphagia, reflux or vomitting. She did start on Aciphex last visit.  No discolored mucus or edema . Last steroids was 3 weeks ago, does better on prednisone with decreased cough.  rec Finish Avelox .  Begin Prednisone 20mg  daily for 4 days then 10mg  daily    07/18/2011 f/u ov/Wert cc persistent fever to 100.1  Better with aleve, cough gone, minimal arthralgias ankles. No rash or ocular c/os>>steroid ceiling /floor set.    09/09/2011 Acute OV Pt presents for an acute office visit. Complains of Pain in right lower  leg from mid calf to  to thigh. Work up yesterday with swelling and pain immediately. Painful to bear weight. No chest pain or dyspnea.  No recent travel or known injury. No fever or new meds. No  on HRT.  No a smoker.  No hx of DVT. No recent surgery.     Allergies   1) ! 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  HEENT:   No headaches,  Difficulty swallowing,  Tooth/dental problems, or  Sore throat,                No sneezing, itching, ear ache, nasal congestion, post nasal drip,   CV:  No chest pain,  Orthopnea, PND, anasarca, dizziness, palpitations, syncope.   GI  No heartburn, indigestion, abdominal pain, nausea, vomiting, diarrhea, change in bowel habits, loss of appetite, bloody stools.   Resp: No shortness of breath with exertion or at rest.  No excess mucus, no productive cough,  No non-productive cough,  No coughing up of blood.  No change in color of mucus.  No wheezing.  No chest wall deformity  Skin: no rash or lesions.  GU: no dysuria, change in color of urine, no urgency or frequency.  No flank pain, no hematuria   MS:  No joint pain or swelling.  No decreased range of motion.  No back pain.  Psych:  No change in mood or affect. No depression or anxiety.  No memory loss.          Objective:   Physical Exam Ambulatory mod obese bf in no acute distress.  wt 216 November 24, 2008 > 220 December 26, 2008 >  230 February 10, 2010 > 219 07/11/2011 > 07/18/2011  218 >> 221 09/09/2011  HEENT: poor  Dentition-several missing/broken teeth and caries , turbinates, and orophanx. Nl external ear canals without cough reflex  Neck without JVD/Nodes/TM  Lungs minimal insp and exp rhonchi.  RRR no s3 or murmur or increase in P2  Abd soft and benign with nl excursion in the supine position. No bruits or organomegaly  Ext warm with right lower calf tenderness, swelling, increased along medial aspect  +homans sign. Slight redness along mid medial lower leg.  Skin warm and dry without lesions       Assessment:          Plan:

## 2011-09-09 NOTE — Assessment & Plan Note (Signed)
Right lower leg/calf swelling /pain -acute onset 9/23 Will set up pt for stat venous doppler to r/o DVt For now leg elevation, warm compress.  Follow on doppler results with decision on plan of care.

## 2011-09-09 NOTE — Progress Notes (Signed)
Addended by: Julio Sicks on: 09/09/2011 04:25 PM   Modules accepted: Orders

## 2011-09-09 NOTE — Patient Instructions (Addendum)
Keep leg elevated.  Warm compresses to leg  We are setting you up for a venous doppler to evaluate your right leg swelling.  I will call you with results and further instructions  Please contact office for sooner follow up if symptoms do not improve or worsen or seek emergency care   Late ADD Preliminary report from doppler - neg for DVt --suspect cellultis w/ enlarged lymph node  Keflex 500mg  Four times a day  For 7 days  follow up .1 week and As needed   Please contact office for sooner follow up if symptoms do not improve or worsen or seek emergency care

## 2011-09-20 LAB — URINE CULTURE: Colony Count: 30000

## 2011-09-20 LAB — URINALYSIS, ROUTINE W REFLEX MICROSCOPIC
Bilirubin Urine: NEGATIVE
Glucose, UA: NEGATIVE
Ketones, ur: NEGATIVE
pH: 6.5

## 2011-09-20 LAB — URINE MICROSCOPIC-ADD ON

## 2011-10-01 ENCOUNTER — Other Ambulatory Visit (INDEPENDENT_AMBULATORY_CARE_PROVIDER_SITE_OTHER): Payer: BC Managed Care – PPO

## 2011-10-01 ENCOUNTER — Telehealth: Payer: Self-pay | Admitting: Internal Medicine

## 2011-10-01 ENCOUNTER — Encounter: Payer: Self-pay | Admitting: Internal Medicine

## 2011-10-01 ENCOUNTER — Ambulatory Visit (INDEPENDENT_AMBULATORY_CARE_PROVIDER_SITE_OTHER): Payer: BC Managed Care – PPO | Admitting: Internal Medicine

## 2011-10-01 VITALS — BP 160/92 | HR 84 | Temp 98.0°F | Ht 65.5 in | Wt 224.8 lb

## 2011-10-01 DIAGNOSIS — M7989 Other specified soft tissue disorders: Secondary | ICD-10-CM

## 2011-10-01 DIAGNOSIS — D869 Sarcoidosis, unspecified: Secondary | ICD-10-CM

## 2011-10-01 DIAGNOSIS — I10 Essential (primary) hypertension: Secondary | ICD-10-CM

## 2011-10-01 DIAGNOSIS — Z23 Encounter for immunization: Secondary | ICD-10-CM

## 2011-10-01 LAB — HEPATIC FUNCTION PANEL
AST: 22 U/L (ref 0–37)
Albumin: 3.9 g/dL (ref 3.5–5.2)
Total Protein: 8.3 g/dL (ref 6.0–8.3)

## 2011-10-01 LAB — BASIC METABOLIC PANEL
BUN: 17 mg/dL (ref 6–23)
Calcium: 9.1 mg/dL (ref 8.4–10.5)
GFR: 107.33 mL/min (ref >60.00)
Glucose, Bld: 107 mg/dL — ABNORMAL HIGH (ref 70–99)
Potassium: 4 mEq/L (ref 3.5–5.1)

## 2011-10-01 LAB — SEDIMENTATION RATE: Sed Rate: 69 mm/hr — ABNORMAL HIGH (ref 0–22)

## 2011-10-01 LAB — CBC WITH DIFFERENTIAL/PLATELET
Basophils Absolute: 0 10*3/uL (ref 0.0–0.1)
Basophils Relative: 0.2 % (ref 0.0–3.0)
Eosinophils Absolute: 0.5 10*3/uL (ref 0.0–0.7)
Lymphocytes Relative: 14.7 % (ref 12.0–46.0)
MCHC: 34.4 g/dL (ref 30.0–36.0)
Neutrophils Relative %: 70.7 % (ref 43.0–77.0)
Platelets: 183 10*3/uL (ref 150.0–400.0)
RBC: 3.95 Mil/uL (ref 3.87–5.11)
RDW: 13.7 % (ref 11.5–14.6)

## 2011-10-01 LAB — CEA: CEA: 0.8 ng/mL (ref 0.0–5.0)

## 2011-10-01 MED ORDER — FUROSEMIDE 20 MG PO TABS: 20.0000 mg | ORAL_TABLET | Freq: Every day | ORAL | Status: DC

## 2011-10-01 NOTE — Progress Notes (Signed)
Subjective:     Patient ID: [name], female   DOB: 08-31-1956, 55 y.o.   MRN: [id number]  HPI   31 yobf minimal smoking hx admitted from 01/20/2008 through 01/27/2008 for evaluation of an FUO with cough and parenchymal pulmonary infiltrates which by transbronchial biopsy dated 01/22/2008 represented noncaseating granulomatous inflammation. 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  Last chronic prednisone 04/15/08 with no flare cough no nodules (right leg and left hand and right arm)  November 24, 2008 ov abrupt onset 11/13/08 coughing and fever and chills and aches rx with abx and prednisone rx since 12/8 and by day of ov just bothered by cough.   December 26, 2008 worse day of ov with fever feels same as in past, each time in setting of having stopped prednisone in last 1 -2 weeks prior to ov, rec floor of 10 mg one half daily   February 20, 2009 ov no flares since last ov on Prednisone 10mg  one half daily rec one half every other day   Apr 17, 2009 on pred 10 one half every other day no flare of sob, ocular or articular co's, fever chills or sweats or unintended wt loss or nausea/anorexia. Did have flare of cough with pollen, now better. Try Prednisone every 3 rd day.   June 12, 2009 no symptoms on 2/3 days when doesn't take prednisone, no nausea, rash, cough, sob, articular or ocular c/o's. so stop prednisone > no trouble.   March 23, 2010 cc dry cough x 2months ever since the flu, day > night assoc with clear nasal drainage. no ocular or articular co's. rec  Prn acid suppression and gerd diet, no further prednisone needed   06/24/2011 ov/[name] post ER eval.  Acute onset cough 2 weeks before going to er  And rx with prednisone > back to baseline with last prednisone 7/5 and abx = zpack now no need for rescue even sleeping or early in am>>Rx Aciphex   07/11/2011 Acute OV  Pt complains of increased SOB, wheezing, dry cough,  fever - went to ED 7.16.12 and was told PNA had not cleared and given avelox x10d PT returns from ER follow up. Over last 3 weeks she has had persistent cough and intermittent fevers. Seen back in ER last week and given Avelox , cXR showed chronic vs acute on chronic changes in Right lung. She was given Avelox for 10 days. She is on 9/10 of Avelox. She is feeling better but cough is not gone. Had low grade  Fever yesterday. She has had mulitple  resp infection in past with reoccurence of cough and congestion. She denies dysphagia, reflux or vomitting. She did start on Aciphex last visit.  No discolored mucus or edema . Last steroids was 3 weeks ago, does better on prednisone with decreased cough.  rec Finish Avelox .  Begin Prednisone 20mg  daily for 4 days then 10mg  daily    07/18/2011 f/u ov/[name] cc persistent fever to 100.1  Better with aleve, cough gone, minimal arthralgias ankles. No rash or ocular c/os>>steroid ceiling /floor set.    09/09/2011 Acute OV Pt presents for an acute office visit. Complains of Pain in right lower  leg from mid calf to  to thigh. Woke up 09/08/11  with swelling and pain immediately. Painful to bear weight. No chest pain or dyspnea.  No recent travel or known injury. No fever or new meds. No  on HRT.  No a smoker.  No hx of DVT. No recent surgery.  rec  Keep leg elevated.  Warm compresses to leg  We are setting you up for a venous doppler to evaluate your right leg swelling.  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 Confused with meds but this may have happpened p changed to non diuretic bp regimen - not following previous instructions on prednisone.  No arthralgias/ rash.  Sleeping ok without nocturnal  or early am exacerbation  of respiratory  c/o's or need for noct saba. Also denies any obvious fluctuation of symptoms with weather or environmental changes or other aggravating or alleviating factors except as outlined above   ROS  At present neg for  any significant sore throat, dysphagia, itching, sneezing,  nasal congestion or excess/ purulent secretions,  fever, chills, sweats, unintended wt loss, pleuritic or exertional cp, hempoptysis, orthopnea pnd.    Also denies presyncope, palpitations, heartburn, abdominal pain, nausea, vomiting, diarrhea  or change in bowel or urinary habits, dysuria,hematuria,  rash, arthralgias, visual complaints, headache, numbness weakness or ataxia.       Allergies   1) ! 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  wt 216 November 24, 2008 > 220 December 26, 2008 > > 07/18/2011  218 >>  221 09/09/2011 > 224 10/01/2011  HEENT: poor  Dentition-several missing/broken teeth and caries , turbinates, and orophanx. Nl external ear canals without cough reflex  Neck without JVD/Nodes/TM  Lungs minimal insp and exp  rhonchi.  RRR no s3 or murmur or increase in P2  Abd soft and benign with nl excursion in the supine position. No bruits or organomegaly  Ext warm with 1-2plus pitting edema both lower ext Skin warm and dry without lesions       Assessment:          Plan:

## 2011-10-01 NOTE — Patient Instructions (Addendum)
Lasix 20mg  (furosemide) one daily  Wear the hose if possible whenever upright as much as possible   Ok to return to work 10/07/11 (excused since 09/09/11 due to leg swelling)  See [name] w/in 2 weeks with all your medications, even over the counter meds, separated in two separate bags, the ones you take no matter what vs the ones you stop once you feel better and take only as needed when you feel you need them.   [name]  will generate for you a new user friendly medication calendar that will put [place] all on the same page re: your medication use.     Without this process, it simply isn't possible to assure that we are providing  your outpatient care  with  the attention to detail we feel you deserve.   If we cannot assure that you're getting that kind of care,  then we cannot manage your problem effectively from this clinic.  Once you have seen [name] and we are sure that we're all on the same page with your medication use she will arrange follow up with me.

## 2011-10-01 NOTE — Telephone Encounter (Signed)
Discussed at ov  

## 2011-10-02 ENCOUNTER — Encounter: Payer: Self-pay | Admitting: Internal Medicine

## 2011-10-02 NOTE — Assessment & Plan Note (Addendum)
Now sym edema bilaterally with no evidence of unilateral cellulitis - she probably has less lympatic drainage from the right leg and that's why this one got big quicker than the left when she started retaining fluid  Add back daily furosemide, consider echo and ct abd/pelvis if not rapidly improving

## 2011-10-02 NOTE — Assessment & Plan Note (Addendum)
Renal fxn fine,  Not Adequate control on present rx, reviewed  > add back daily diuretic in the form of furosemide

## 2011-10-02 NOTE — Assessment & Plan Note (Signed)
Did not follow previous instructions and struggling with concept of medication reconciliation.   To keep things simple, I have asked the patient to first separate medicines that are perceived as maintenance, that is to be taken daily "no matter what", from those medicines that are taken on only on an as-needed basis and I have given the patient examples of both, and then return to see our NP to generate a  detailed  medication calendar which should be followed until the next physician sees the patient and updates it.    For now ok to leave off prednisone, consider CT ABD/ Pelvis if swelling does not resolve as this may be related to previoius ca surgery vs adenopathy from sarcoid

## 2011-10-04 ENCOUNTER — Telehealth: Payer: Self-pay | Admitting: Internal Medicine

## 2011-10-04 MED ORDER — AZITHROMYCIN 250 MG PO TABS: ORAL_TABLET | ORAL | Status: AC

## 2011-10-04 NOTE — Telephone Encounter (Signed)
Call in a zpak

## 2011-10-04 NOTE — Telephone Encounter (Signed)
Called, spoke with pt.  She c/o chills, runny nose, slight increased SOB, watery eyes, some body aches, and cough with a small amount of yellow mucus.  Symptoms started last night.  States she received the flu vac on Tuesday and states "this always hits me 3 days after I get the shot."  She is taking tylenol extra strength and using rescue inhaler with relief.  Also took nitequil last night.  She was last seen by MW on 10/01/11 and requesting any further recs.  Dr.  Sherene Sires, pls advise. Thanks.  Allergies verified [facility]   Allergies  Allergen Reactions  . Penicillins

## 2011-10-04 NOTE — Telephone Encounter (Signed)
I spoke with pt and she is aware of MW recs and zpak has been sent to the pharmacy. Nothing further was needed

## 2011-10-07 ENCOUNTER — Telehealth: Payer: Self-pay | Admitting: Internal Medicine

## 2011-10-07 NOTE — Telephone Encounter (Signed)
ok 

## 2011-10-07 NOTE — Telephone Encounter (Signed)
Called, spoke with pt.  States MW had excused her from work with a return date of today.  However, she did not feel well enough to go in today but would like to try to go back to work on tomorrow.  She would like the work excuse to be extended thru today - stating she can return to work on tomorrow, Oct 23.  She would like this to be faxed to [phone].  She is aware MW will not be back in the office until tomorrow.  Dr. Sherene Sires, are you ok with this?

## 2011-10-08 ENCOUNTER — Encounter: Payer: Self-pay | Admitting: *Deleted

## 2011-10-08 NOTE — Telephone Encounter (Signed)
Pt returned call from triage. Says she needs this note faxed asap TODAY because her boss must have this in since she went back to work today. [name]

## 2011-10-08 NOTE — Telephone Encounter (Signed)
Letter faxed to [phone] -- pt aware and will call back if anything further is needed.

## 2011-10-08 NOTE — Telephone Encounter (Signed)
[email or web address]  

## 2011-10-15 ENCOUNTER — Encounter: Payer: Self-pay | Admitting: Adult Health

## 2011-10-15 ENCOUNTER — Ambulatory Visit (INDEPENDENT_AMBULATORY_CARE_PROVIDER_SITE_OTHER): Payer: BC Managed Care – PPO | Admitting: Adult Health

## 2011-10-15 DIAGNOSIS — D869 Sarcoidosis, unspecified: Secondary | ICD-10-CM

## 2011-10-15 DIAGNOSIS — I10 Essential (primary) hypertension: Secondary | ICD-10-CM

## 2011-10-15 MED ORDER — OLMESARTAN MEDOXOMIL 40 MG PO TABS: 40.0000 mg | ORAL_TABLET | Freq: Every day | ORAL | Status: DC

## 2011-10-15 NOTE — Progress Notes (Signed)
Subjective:     Patient ID: [name], female   DOB: [birth year], 55 y.o.   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  Last chronic prednisone 04/15/08 with no flare cough no nodules (right leg and left hand and right arm)  November 24, 2008 ov abrupt onset 11/13/08 coughing and fever and chills and aches rx with abx and prednisone rx since 12/8 and by day of ov just bothered by cough.   December 26, 2008 worse day of ov with fever feels same as in past, each time in setting of having stopped prednisone in last 1 -2 weeks prior to ov, rec floor of 10 mg one half daily   February 20, 2009 ov no flares since last ov on Prednisone 10mg  one half daily rec one half every other day   Apr 17, 2009 on pred 10 one half every other day no flare of sob, ocular or articular co's, fever chills or sweats or unintended wt loss or nausea/anorexia. Did have flare of cough with pollen, now better. Try Prednisone every 3 rd day.   June 12, 2009 no symptoms on 2/3 days when doesn't take prednisone, no nausea, rash, cough, sob, articular or ocular c/o's. so stop prednisone > no trouble.   March 23, 2010 cc dry cough x 2months ever since the flu, day > night assoc with clear nasal drainage. no ocular or articular co's. rec  Prn acid suppression and gerd diet, no further prednisone needed   06/24/2011 ov/Wert post ER eval.  Acute onset cough 2 weeks before going to er  And rx with prednisone > back to baseline with last prednisone 7/5 and abx = zpack now no need for rescue even sleeping or early in am>>Rx Aciphex   07/11/2011 Acute OV  Pt complains of increased SOB, wheezing, dry cough,  fever - went to ED 7.16.12 and was told PNA had not cleared and given avelox x10d PT returns from ER follow up. Over last 3 weeks she has had persistent cough and intermittent fevers. Seen back in ER last week and given Avelox , cXR showed chronic vs acute on chronic changes in Right lung. She was given Avelox for 10 days. She is on 9/10 of Avelox. She is feeling better but cough is not gone. Had low grade  Fever yesterday. She has had mulitple  resp infection in past with reoccurence of cough and congestion. She denies dysphagia, reflux or vomitting. She did start on Aciphex last visit.  No discolored mucus or edema . Last steroids was 3 weeks ago, does better on prednisone with decreased cough.  rec Finish Avelox .  Begin Prednisone 20mg  daily for 4 days then 10mg  daily    07/18/2011 f/u ov/Wert cc persistent fever to 100.1  Better with aleve, cough gone, minimal arthralgias ankles. No rash or ocular c/os>>steroid ceiling /floor set.    09/09/2011 Acute OV Pt presents for an acute office visit. Complains of Pain in right lower  leg from mid calf to  to thigh. Woke up 09/08/11  with swelling and pain immediately. Painful to bear weight. No chest pain or dyspnea.  No recent travel or known injury. No fever or new meds. No  on HRT.  No a smoker.  No hx of DVT. No recent surgery.  rec  Keep leg elevated.  Warm compresses to leg  We are setting you up for a venous doppler to evaluate your right leg swelling.  I will call you with results and further instructions  Please contact office for sooner follow up if symptoms do not improve or worsen or seek emergency care   Late ADD Preliminary report from doppler - neg for DVt --suspect cellultis w/ enlarged lymph node  Keflex 500mg  Four times a day  For 7 days  follow up .1 week and As needed   Please contact office for sooner follow up if symptoms do not improve or worsen or seek emergency care   10/01/11 Wert/ ov cc both legs swollen, no orthopnea or  doe. Confused with meds but this may have happpened p changed to non diuretic bp regimen - not following previous instructions on prednisone.  No arthralgias/ rash.>>Lasix rx   10/15/2011 Follow up  Pt presents for follow up and med review. We reviewed all her meds and organized them into a med calendar with pt education. IT appears she has stopped her prednisone on her own. No increased cough or dsyppnea.   SHe is feeling better with less swelling.         Allergies   1) ! 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)  OBESITY (ICD-278.00)  SARCOIDOSIS (ICD-135) originally evaluated elsewhere in about 1996 with no tissue diagnosis  - TBBX pos ncg 01/22/08  - Chronic pred rx 2/09> 04/2008  - Restart Pred December 26, 2008 > June 12, 2009 stopped > restarted daily rx  07/11/11 - PFT's March 23, 2010 VC 1.77 (66%) no obst, ERV 34%, DLCO 53 > 115% corrected  G E R D (ICD-530.81)  Rectal cancer status post chemotherapy and radiation July 2005  Right calf pain/swelling 09/09/2011 >> neg venous doppler  Neg Complex med regimen>>med calendar 10/15/2011                         Objective:   Physical Exam Ambulatory mod obese bf in no acute distress.  wt 216 November 24, 2008 > 220 December 26, 2008 > > 07/18/2011  218 >>  221 09/09/2011 > 224 10/01/2011 >>223 10/15/2011  HEENT: poor  Dentition-several missing/broken teeth and caries , turbinates, and orophanx. Nl external ear canals without cough reflex  Neck without JVD/Nodes/TM  Lungs minimal insp and exp rhonchi.  RRR no s3 or murmur or increase in P2  Abd soft and benign with nl excursion in the supine position. No bruits or organomegaly  Ext warm with 1+edema both lower ext Skin warm and dry without lesions       Assessment:          Plan:

## 2011-10-15 NOTE — Patient Instructions (Signed)
Continue on current regimen.  Follow med calendar closely and bring to each visit.  Please contact office for sooner follow up if symptoms do not improve or worsen or seek emergency care  follow up Dr. Sherene Sires  In 3 months and As needed

## 2011-10-16 NOTE — Assessment & Plan Note (Signed)
Cont on current regimen Encouraged on low salt diet and weight loss Patient's medications were reviewed today and patient education was given. Computerized medication calendar was adjusted/completed

## 2012-03-18 ENCOUNTER — Other Ambulatory Visit: Payer: Self-pay | Admitting: Obstetrics and Gynecology

## 2012-03-18 DIAGNOSIS — R928 Other abnormal and inconclusive findings on diagnostic imaging of breast: Secondary | ICD-10-CM

## 2012-03-22 ENCOUNTER — Emergency Department (INDEPENDENT_AMBULATORY_CARE_PROVIDER_SITE_OTHER)
Admission: EM | Admit: 2012-03-22 | Discharge: 2012-03-22 | Disposition: A | Discharge status: Home / Self Care | Payer: BC Managed Care – PPO | Source: Home / Self Care | Attending: Emergency Medicine | Admitting: Emergency Medicine

## 2012-03-22 ENCOUNTER — Encounter (HOSPITAL_COMMUNITY): Payer: Self-pay | Admitting: *Deleted

## 2012-03-22 DIAGNOSIS — K529 Noninfective gastroenteritis and colitis, unspecified: Secondary | ICD-10-CM

## 2012-03-22 DIAGNOSIS — K5289 Other specified noninfective gastroenteritis and colitis: Secondary | ICD-10-CM

## 2012-03-22 LAB — CBC
HCT: 38.3 % (ref 36.0–46.0)
Hemoglobin: 13.8 g/dL (ref 12.0–15.0)
MCH: 32.5 pg (ref 26.0–34.0)
MCHC: 36 g/dL (ref 30.0–36.0)
MCV: 90.1 fL (ref 78.0–100.0)
RDW: 13.9 % (ref 11.5–15.5)

## 2012-03-22 LAB — DIFFERENTIAL
Basophils Absolute: 0 10*3/uL (ref 0.0–0.1)
Basophils Relative: 0 % (ref 0–1)
Eosinophils Relative: 2 % (ref 0–5)
Monocytes Absolute: 1.4 10*3/uL — ABNORMAL HIGH (ref 0.1–1.0)
Monocytes Relative: 11 % (ref 3–12)

## 2012-03-22 LAB — POCT I-STAT, CHEM 8
Calcium, Ion: 1.12 mmol/L (ref 1.12–1.32)
Creatinine, Ser: 1.1 mg/dL (ref 0.50–1.10)
Glucose, Bld: 108 mg/dL — ABNORMAL HIGH (ref 70–99)
HCT: 42 % (ref 36.0–46.0)
Hemoglobin: 14.3 g/dL (ref 12.0–15.0)
Potassium: 3.7 mEq/L (ref 3.5–5.1)

## 2012-03-22 MED ORDER — ONDANSETRON HCL 4 MG PO TABS: 4.0000 mg | ORAL_TABLET | Freq: Three times a day (TID) | ORAL | Status: AC | PRN

## 2012-03-22 MED ORDER — ONDANSETRON HCL 4 MG/2ML IJ SOLN
4.0000 mg | Freq: Once | INTRAMUSCULAR | Status: AC
  Administered 2012-03-22: 4 mg via INTRAVENOUS

## 2012-03-22 MED ORDER — ACETAMINOPHEN 325 MG PO TABS
975.0000 mg | ORAL_TABLET | Freq: Four times a day (QID) | ORAL | Status: DC | PRN
  Administered 2012-03-22: 975 mg via ORAL

## 2012-03-22 MED ORDER — DIPHENOXYLATE-ATROPINE 2.5-0.025 MG PO TABS: 1.0000 | ORAL_TABLET | Freq: Four times a day (QID) | ORAL | Status: AC | PRN

## 2012-03-22 MED ORDER — ACETAMINOPHEN 325 MG PO TABS
ORAL_TABLET | ORAL | Status: AC
  Filled 2012-03-22: qty 4

## 2012-03-22 MED ORDER — ONDANSETRON HCL 4 MG/2ML IJ SOLN
INTRAMUSCULAR | Status: AC
  Filled 2012-03-22: qty 2

## 2012-03-22 MED ORDER — SODIUM CHLORIDE 0.9 % IJ SOLN
1000.0000 mL | Freq: Once | INTRAMUSCULAR | Status: AC
  Administered 2012-03-22: 1000 mL via INTRAVENOUS

## 2012-03-22 NOTE — ED Provider Notes (Signed)
History     CSN: [id number]  Arrival date & time 03/22/12  1441   First MD Initiated Contact with Patient 03/22/12 1455      Chief Complaint  Patient presents with  . Nausea  . Emesis  . Diarrhea  . Fever    (Consider location/radiation/quality/duration/timing/severity/associated sxs/prior treatment) HPI Comments: Patient presents urgent care after having expressed more than 20 episodes of diarrhea and a few episodes of vomiting. Have been sick since Friday she describes that there have been several people sick at her job. She has a headache and has been feeling fevers at home for the last 2 days. Described that she will woke up Friday a.m. experiencing all these symptoms.   Patient is a 56 y.o. female presenting with vomiting, diarrhea, and fever. The history is provided by the patient.  Emesis  This is a new problem. The current episode started 2 days ago. The problem occurs more than 10 times per day. The problem has been gradually worsening. The emesis has an appearance of stomach contents. The maximum temperature recorded prior to her arrival was 101 to 101.9 F. Associated symptoms include abdominal pain, arthralgias, chills, cough, diarrhea, a fever, headaches and myalgias. Pertinent negatives include no URI.  Diarrhea The primary symptoms include fever, abdominal pain, vomiting, diarrhea, myalgias and arthralgias.  The illness is also significant for chills.  Fever Primary symptoms of the febrile illness include fever, headaches, cough, abdominal pain, vomiting, diarrhea, myalgias and arthralgias.    Past Medical History  Diagnosis Date  . Hypertension   . Pulmonary nodule, left   . Obesity   . Sarcoidosis   . GERD (gastroesophageal reflux disease)   . Rectal cancer     History reviewed. No pertinent past surgical history.  Family History  Problem Relation Age of Onset  . Allergies Mother   . Allergies Sister   . Allergies Brother   . Asthma Mother   .  Sarcoidosis Neg Hx     History  Substance Use Topics  . Smoking status: Former Smoker    Types: Cigarettes    Quit date: 12/16/1993  . Smokeless tobacco: Never Used   Comment: smoked breifly while in college  . Alcohol Use: No    OB History    Grav Para Term Preterm Abortions TAB SAB Ect Mult Living                  Review of Systems  Constitutional: Positive for fever and chills.  Respiratory: Positive for cough.   Gastrointestinal: Positive for vomiting, abdominal pain and diarrhea.  Musculoskeletal: Positive for myalgias and arthralgias.  Neurological: Positive for headaches.    Allergies  Penicillins  Home Medications   Current Outpatient Rx  Name Route Sig Dispense Refill  . EXFORGE PO Oral Take by mouth once.    Marland Kitchen VITAMIN C 500 MG PO CHEW  Once a day every morning    . IMODIUM PO Oral Take by mouth.    . MULTIVITAMINS PO CAPS Oral Take 1 capsule by mouth every morning.     Marland Kitchen RABEPRAZOLE SODIUM 20 MG PO TBEC       . ALBUTEROL SULFATE HFA 108 (90 BASE) MCG/ACT IN AERS Inhalation Inhale 2 puffs into the lungs every 6 (six) hours as needed.      Marland Kitchen VITAMIN D 1000 UNITS PO TABS  2 tablets daily     . DIPHENOXYLATE-ATROPINE 2.5-0.025 MG PO TABS Oral Take 1 tablet by mouth 4 (four) times daily  as needed for diarrhea or loose stools. 20 tablet 0  . FUROSEMIDE 20 MG PO TABS Oral Take 20 mg by mouth every morning.      Marland Kitchen OLMESARTAN MEDOXOMIL 40 MG PO TABS Oral Take 40 mg by mouth every morning.      Marland Kitchen ONDANSETRON HCL 4 MG PO TABS Oral Take 1 tablet (4 mg total) by mouth every 8 (eight) hours as needed for nausea. 20 tablet 0    BP 134/73  Pulse 89  Temp(Src) 99.5 F (37.5 C) (Oral)  Resp 18  SpO2 96%  Physical Exam  Nursing note and vitals reviewed. Constitutional: Vital signs are normal. She appears well-developed and well-nourished.  HENT:  Head: Normocephalic.  Right Ear: No drainage.  Left Ear: No drainage.  Mouth/Throat: Uvula is midline, oropharynx is  clear and moist and mucous membranes are normal.  Eyes: Conjunctivae are normal. Right eye exhibits no discharge. Left eye exhibits no discharge.  Neck: Neck supple.  Cardiovascular:  No murmur heard. Pulmonary/Chest: Effort normal. No respiratory distress. She has no decreased breath sounds. She has no wheezes. She has no rhonchi. She has no rales. She exhibits no tenderness.  Abdominal: Soft. Normal appearance. She exhibits no distension and no mass. There is hepatosplenomegaly. There is no hepatomegaly. There is tenderness in the epigastric area and periumbilical area. There is no rigidity, no rebound, no guarding, no CVA tenderness and negative Murphy's sign.  Lymphadenopathy:    She has no cervical adenopathy.  Skin: No rash noted.    ED Course  Procedures (including critical care time)  Labs Reviewed  CBC - Abnormal; Notable for the following:    WBC 12.2 (*)    All other components within normal limits  DIFFERENTIAL - Abnormal; Notable for the following:    Neutro Abs 9.3 (*)    Lymphocytes Relative 11 (*)    Monocytes Absolute 1.4 (*)    All other components within normal limits  POCT I-STAT, CHEM 8 - Abnormal; Notable for the following:    BUN 26 (*)    Glucose, Bld 108 (*)    All other components within normal limits  LIPASE, BLOOD   No results found.   1. Gastroenteritis or enteritis       MDM  Patient with acute sudden gastrointestinal symptoms for 3 days. Afebrile and with clinical dehydration his mucosas are dry to mildly tachycardic. Noticeable improvement after IV fluids and IV Zofran. Mild leukocytosis with a right shift. History physical exam response treatment were all consistent with most likely a viral process. Patient was encouraged to return or followup in the emergency department if worsening symptoms despite symptomatic treatment        Veronica Molly, MD 03/22/12 [id number]

## 2012-03-22 NOTE — ED Notes (Signed)
Pt with c/o of nausea/vomiting and diarrhea onset Friday - today vomited x 3 - diarrhea too many to count - pt with fever on arrival to [facility] -

## 2012-03-22 NOTE — Discharge Instructions (Signed)
As discussed  Take precautions about your diet in the next 48 hours. Take prescribed medications as needed expect improvement in the next 1-2 days. If abdominal pain changes in character or location especially if it locates on the right lower abdominal region should go to the emergency department for further evaluation.    Diet for Diarrhea, Adult Having frequent, runny stools (diarrhea) has many causes. Diarrhea may be caused or worsened by food or drink. Diarrhea may be relieved by changing your diet. IF YOU ARE NOT TOLERATING SOLID FOODS:  Drink enough water and fluids to keep your urine clear or pale yellow.   Avoid sugary drinks and sodas as well as milk-based beverages.   Avoid beverages containing caffeine and alcohol.   You may try rehydrating beverages. You can make your own by following this recipe:    tsp table salt.    tsp baking soda.   ? tsp salt substitute (potassium chloride).   1 tbs + 1 tsp sugar.   1 qt water.  As your stools become more solid, you can start eating solid foods. Add foods one at a time. If a certain food causes your diarrhea to get worse, avoid that food and try other foods. A low fiber, low-fat, and lactose-free diet is recommended. Small, frequent meals may be better tolerated.  Starches  Allowed:  White, [place], and pita breads, plain rolls, buns, bagels. Plain muffins, matzo. Soda, saltine, or graham crackers. Pretzels, melba toast, zwieback. Cooked cereals made with water: cornmeal, farina, cream cereals. Dry cereals: refined corn, wheat, rice. Potatoes prepared any way without skins, refined macaroni, spaghetti, noodles, refined rice.   Avoid:  Bread, rolls, or crackers made with whole wheat, multi-grains, rye, bran seeds, nuts, or coconut. Corn tortillas or taco shells. Cereals containing whole grains, multi-grains, bran, coconut, nuts, or raisins. Cooked or dry oatmeal. Coarse wheat cereals, granola. Cereals advertised as "high-fiber."  Potato skins. Whole grain pasta, wild or brown rice. Popcorn. Sweet potatoes/yams. Sweet rolls, doughnuts, waffles, pancakes, sweet breads.  Vegetables  Allowed: Strained tomato and vegetable juices. Most well-cooked and canned vegetables without seeds. Fresh: Tender lettuce, cucumber without the skin, cabbage, spinach, bean sprouts.   Avoid: Fresh, cooked, or canned: Artichokes, baked beans, beet greens, broccoli, Brussels sprouts, corn, kale, legumes, peas, sweet potatoes. Cooked: Green or red cabbage, spinach. Avoid large servings of any vegetables, because vegetables shrink when cooked, and they contain more fiber per serving than fresh vegetables.  Fruit  Allowed: All fruit juices except prune juice. Cooked or canned: Apricots, applesauce, cantaloupe, cherries, fruit cocktail, grapefruit, grapes, kiwi, mandarin oranges, peaches, pears, plums, watermelon. Fresh: Apples without skin, ripe banana, grapes, cantaloupe, cherries, grapefruit, peaches, oranges, plums. Keep servings limited to  cup or 1 piece.   Avoid: Fresh: Apple with skin, apricots, mango, pears, raspberries, strawberries. Prune juice, stewed or dried prunes. Dried fruits, raisins, dates. Large servings of all fresh fruits.  Meat and Meat Substitutes  Allowed: Ground or well-cooked tender beef, ham, veal, lamb, pork, or poultry. Eggs, plain cheese. Fish, oysters, shrimp, lobster, other seafoods. Liver, organ meats.   Avoid: Tough, fibrous meats with gristle. Peanut butter, smooth or chunky. Cheese, nuts, seeds, legumes, dried peas, beans, lentils.  Milk  Allowed: Yogurt, lactose-free milk, kefir, drinkable yogurt, buttermilk, soy milk.   Avoid: Milk, chocolate milk, beverages made with milk, such as milk shakes.  Soups  Allowed: Bouillon, broth, or soups made from allowed foods. Any strained soup.   Avoid: Soups made from vegetables that  are not allowed, cream or milk-based soups.  Desserts and Sweets  Allowed: Sugar-free  gelatin, sugar-free frozen ice pops made without sugar alcohol.   Avoid: Plain cakes and cookies, pie made with allowed fruit, pudding, custard, cream pie. Gelatin, fruit, ice, sherbet, frozen ice pops. Ice cream, ice milk without nuts. Plain hard candy, honey, jelly, molasses, syrup, sugar, chocolate syrup, gumdrops, marshmallows.  Fats and Oils  Allowed: Avoid any fats and oils.   Avoid: Seeds, nuts, olives, avocados. Margarine, butter, cream, mayonnaise, salad oils, plain salad dressings made from allowed foods. Plain gravy, crisp bacon without rind.  Beverages  Allowed: Water, decaffeinated teas, oral rehydration solutions, sugar-free beverages.   Avoid: Fruit juices, caffeinated beverages (coffee, tea, soda or pop), alcohol, sports drinks, or lemon-lime soda or pop.  Condiments  Allowed: Ketchup, mustard, horseradish, vinegar, cream sauce, cheese sauce, cocoa powder. Spices in moderation: allspice, basil, bay leaves, celery powder or leaves, cinnamon, cumin powder, curry powder, ginger, mace, marjoram, onion or garlic powder, oregano, paprika, parsley flakes, ground pepper, rosemary, sage, savory, tarragon, thyme, turmeric.   Avoid: Coconut, honey.  Weight Monitoring: Weigh yourself every day. You should weigh yourself in the morning after you urinate and before you eat breakfast. Wear the same amount of clothing when you weigh yourself. Record your weight daily. Bring your recorded weights to your clinic visits. Tell your caregiver right away if you have gained 3 lb/1.4 kg or more in 1 day, 5 lb/2.3 kg in a week, or whatever amount you were told to report. SEEK IMMEDIATE MEDICAL CARE IF:   You are unable to keep fluids down.   You start to throw up (vomit) or diarrhea keeps coming back (persistent).   Abdominal pain develops, increases, or can be felt in one place (localizes).   You have an oral temperature above 102 F (38.9 C), not controlled by medicine.   Diarrhea contains  blood or mucus.   You develop excessive weakness, dizziness, fainting, or extreme thirst.  MAKE SURE YOU:   Understand these instructions.   Will watch your condition.   Will get help right away if you are not doing well or get worse.  Document Released: 02/22/2004 Document Revised: 11/21/2011 Document Reviewed: 06/15/2009 [facility] Patient Information 2012 [place], [place].

## 2012-03-30 ENCOUNTER — Ambulatory Visit
Admission: RE | Admit: 2012-03-30 | Discharge: 2012-03-30 | Disposition: A | Discharge status: Home / Self Care | Payer: BC Managed Care – PPO | Source: Ambulatory Visit | Attending: Obstetrics and Gynecology | Admitting: Obstetrics and Gynecology

## 2012-03-30 DIAGNOSIS — R928 Other abnormal and inconclusive findings on diagnostic imaging of breast: Secondary | ICD-10-CM

## 2012-04-29 ENCOUNTER — Ambulatory Visit: Payer: BC Managed Care – PPO | Admitting: Internal Medicine

## 2012-04-29 ENCOUNTER — Telehealth: Payer: Self-pay | Admitting: Internal Medicine

## 2012-04-29 NOTE — Telephone Encounter (Signed)
Called spoke with patient who c/o increased SOB, wheezing, dry cough and tightness in chest x3 days - denies f/c/s.  Pt requested appt tomorrow with MW but he is not in the office.  2 openings on Fri 5.17.13 and 1 opening this afternoon at 12N.  Appt with MW made for 12N today.  Pt to call back if she cannot make this appt for a rsc to Friday.    Will sign off.

## 2012-04-30 ENCOUNTER — Telehealth: Payer: Self-pay | Admitting: Internal Medicine

## 2012-04-30 MED ORDER — PROMETHAZINE-CODEINE 6.25-10 MG/5ML PO SYRP: 5.0000 mL | ORAL_SOLUTION | Freq: Four times a day (QID) | ORAL | Status: AC | PRN

## 2012-04-30 NOTE — Telephone Encounter (Signed)
Called spoke with patient, advised of CDY's recs re cough syrup.  Pt okay with these recs and verbalized her understanding.  rx telephoned to verified pharmacy.

## 2012-04-30 NOTE — Telephone Encounter (Signed)
Called and spoke with pt and she stated that she has appt with TP on Monday but has a very bad cough--non productive, was up all night and is requesting something to help with the cough until her appt on Monday with TP.  CY please advise.  Thanks  Allergies  Allergen Reactions  . Penicillins

## 2012-04-30 NOTE — Telephone Encounter (Signed)
Per CY-okay to give patient Phenergan with codeine cough syrup #120 ml 1 tsp every 6 hours prn cough no refills.

## 2012-05-04 ENCOUNTER — Ambulatory Visit (INDEPENDENT_AMBULATORY_CARE_PROVIDER_SITE_OTHER): Payer: BC Managed Care – PPO | Admitting: Adult Health

## 2012-05-04 ENCOUNTER — Encounter: Payer: Self-pay | Admitting: Adult Health

## 2012-05-04 VITALS — BP 136/76 | HR 87 | Temp 97.9°F | Ht 65.5 in | Wt 223.4 lb

## 2012-05-04 DIAGNOSIS — D869 Sarcoidosis, unspecified: Secondary | ICD-10-CM

## 2012-05-04 NOTE — Assessment & Plan Note (Signed)
Flare   Plan Prednisone 10mg  4 tabs for 2 days, then 3 tabs for 2 days, 2 tabs for 2 days, then 1 tab for 2 days, then stop May use Delsym 2 tsp Twice daily  As needed  Cough May use Phenergan cough syrup As needed   Please contact office for sooner follow up if symptoms do not improve or worsen or seek emergency care  follow up Dr. Sherene Sires  In 2-3 months and As needed

## 2012-05-04 NOTE — Patient Instructions (Signed)
Prednisone 10mg  4 tabs for 2 days, then 3 tabs for 2 days, 2 tabs for 2 days, then 1 tab for 2 days, then stop May use Delsym 2 tsp Twice daily  As needed  Cough May use Phenergan cough syrup As needed   Please contact office for sooner follow up if symptoms do not improve or worsen or seek emergency care  follow up Dr. Sherene Sires  In 2-3 months and As needed

## 2012-05-04 NOTE — Progress Notes (Signed)
Subjective:     Patient ID: [name], female   DOB: 1956/09/02, 56 y.o.   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  Last chronic prednisone 04/15/08 with no flare cough no nodules (right leg and left hand and right arm)  November 24, 2008 ov abrupt onset 11/13/08 coughing and fever and chills and aches rx with abx and prednisone rx since 12/8 and by day of ov just bothered by cough.   December 26, 2008 worse day of ov with fever feels same as in past, each time in setting of having stopped prednisone in last 1 -2 weeks prior to ov, rec floor of 10 mg one half daily   February 20, 2009 ov no flares since last ov on Prednisone 10mg  one half daily rec one half every other day   Apr 17, 2009 on pred 10 one half every other day no flare of sob, ocular or articular co's, fever chills or sweats or unintended wt loss or nausea/anorexia. Did have flare of cough with pollen, now better. Try Prednisone every 3 rd day.   June 12, 2009 no symptoms on 2/3 days when doesn't take prednisone, no nausea, rash, cough, sob, articular or ocular c/o's. so stop prednisone > no trouble.   March 23, 2010 cc dry cough x 2months ever since the flu, day > night assoc with clear nasal drainage. no ocular or articular co's. rec  Prn acid suppression and gerd diet, no further prednisone needed   06/24/2011 ov/Wert post ER eval.  Acute onset cough 2 weeks before going to er  And rx with prednisone > back to baseline with last prednisone 7/5 and abx = zpack now no need for rescue even sleeping or early in am>>Rx Aciphex   07/11/2011 Acute OV  Pt complains of increased SOB, wheezing, dry cough,  fever - went to ED 7.16.12 and was told PNA had not cleared and given avelox x10d PT returns from ER follow up. Over last 3 weeks she has had persistent cough and intermittent fevers. Seen back in ER last week and given Avelox , cXR showed chronic vs acute on chronic changes in Right lung. She was given Avelox for 10 days. She is on 9/10 of Avelox. She is feeling better but cough is not gone. Had low grade  Fever yesterday. She has had mulitple  resp infection in past with reoccurence of cough and congestion. She denies dysphagia, reflux or vomitting. She did start on Aciphex last visit.  No discolored mucus or edema . Last steroids was 3 weeks ago, does better on prednisone with decreased cough.  rec Finish Avelox .  Begin Prednisone 20mg  daily for 4 days then 10mg  daily    07/18/2011 f/u ov/Wert cc persistent fever to 100.1  Better with aleve, cough gone, minimal arthralgias ankles. No rash or ocular c/os>>steroid ceiling /floor set.    09/09/2011 Acute OV Pt presents for an acute office visit. Complains of Pain in right lower  leg from mid calf to  to thigh. Woke up 09/08/11  with swelling and pain immediately. Painful to bear weight. No chest pain or dyspnea.  No recent travel or known injury. No fever or new meds. No  on HRT.  No a smoker.  No hx of DVT. No recent surgery.  rec  Keep leg elevated.  Warm compresses to leg  We are setting you up for a venous doppler to evaluate your right leg swelling.  I will call you with results and further instructions  Please contact office for sooner follow up if symptoms do not improve or worsen or seek emergency care   Late ADD Preliminary report from doppler - neg for DVt --suspect cellultis w/ enlarged lymph node  Keflex 500mg  Four times a day  For 7 days  follow up .1 week and As needed   Please contact office for sooner follow up if symptoms do not improve or worsen or seek emergency care   10/01/11 Wert/ ov cc both legs swollen, no orthopnea or  doe. Confused with meds but this may have happpened p changed to non diuretic bp regimen - not following previous instructions on prednisone.  No arthralgias/ rash.>>Lasix rx   10/15/2011 Follow up  Pt presents for follow up and med review. We reviewed all her meds and organized them into a med calendar with pt education. IT appears she has stopped her prednisone on her own. No increased cough or dsyppnea.   SHe is feeling better with less swelling.  >med calendar   05/04/2012 Acute OV  Complains of  c/o sob and dry cough x 1 week. Also c/o a sore chest possibly from cough.  Cough is severe at times.  Is taking phenergan w/ codeine cough syrup with some help/  No discolored mucus or fever  No rash.        Allergies   1) ! 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)  OBESITY (ICD-278.00)  SARCOIDOSIS (ICD-135) originally evaluated elsewhere in about 1996 with no tissue diagnosis  - TBBX pos ncg 01/22/08  - Chronic pred rx 2/09> 04/2008  - Restart Pred December 26, 2008 > June 12, 2009 stopped > restarted daily rx  07/11/11 - PFT's March 23, 2010 VC 1.77 (66%) no obst, ERV 34%, DLCO 53 > 115% corrected  G E R D (ICD-530.81)  Rectal cancer status post chemotherapy and radiation July 2005  Right calf pain/swelling 09/09/2011 >> neg venous doppler  Neg Complex med regimen>>med calendar 10/15/2011            ROS:  Constitutional:   No  weight loss, night sweats,  Fevers, chills,  +fatigue, or  lassitude.  HEENT:   No headaches,  Difficulty swallowing,  Tooth/dental problems, or  Sore throat,                No sneezing, itching, ear ache, nasal congestion, post nasal drip,   CV:  No chest pain,  Orthopnea, PND, swelling in lower extremities, anasarca, dizziness, palpitations, syncope.   GI  No heartburn, indigestion, abdominal pain, nausea, vomiting, diarrhea, change in bowel habits, loss of appetite, bloody stools.     Resp: ,  No coughing up of blood.   Marland Kitchen  No chest wall deformity  Skin: no rash or lesions.  GU: no dysuria, change in color of urine, no urgency or frequency.  No flank pain, no hematuria   MS:  No joint pain or swelling.  No decreased range of motion.  No back pain.  Psych:  No change in mood or affect. No depression or anxiety.  No memory loss.  Objective:   Physical Exam Ambulatory mod obese bf in no acute distress.  wt 216 November 24, 2008 > 220 December 26, 2008 > > 07/18/2011  218 >>  221 09/09/2011 > 224 10/01/2011 >>223 10/15/2011 >223 05/04/2012  HEENT: nml  turbinates, and orophanx. Nl external ear canals without cough reflex  Neck without JVD/Nodes/TM  Lungs coarse BS w/ no wheezing  RRR no s3 or murmur or increase in P2  Abd soft and benign with nl excursion in the supine position. No bruits or organomegaly  Ext warm with  Tr edema  Skin warm and dry without lesions       Assessment:          Plan:

## 2012-09-01 ENCOUNTER — Encounter: Payer: Self-pay | Admitting: Pulmonary Disease

## 2012-09-01 ENCOUNTER — Ambulatory Visit (INDEPENDENT_AMBULATORY_CARE_PROVIDER_SITE_OTHER): Payer: BC Managed Care – PPO | Admitting: Pulmonary Disease

## 2012-09-01 ENCOUNTER — Telehealth: Payer: Self-pay | Admitting: Internal Medicine

## 2012-09-01 VITALS — BP 150/98 | HR 79 | Temp 98.2°F | Ht 65.0 in | Wt 214.8 lb

## 2012-09-01 DIAGNOSIS — D869 Sarcoidosis, unspecified: Secondary | ICD-10-CM

## 2012-09-01 DIAGNOSIS — R059 Cough, unspecified: Secondary | ICD-10-CM

## 2012-09-01 DIAGNOSIS — R05 Cough: Secondary | ICD-10-CM

## 2012-09-01 MED ORDER — PREDNISONE 10 MG PO TABS: ORAL_TABLET | ORAL | Status: DC

## 2012-09-01 MED ORDER — ALBUTEROL SULFATE HFA 108 (90 BASE) MCG/ACT IN AERS: 2.0000 | INHALATION_SPRAY | Freq: Four times a day (QID) | RESPIRATORY_TRACT | Status: DC | PRN

## 2012-09-01 NOTE — Telephone Encounter (Signed)
Pt could not come to the phone but per coworker pt was wanting an appt this afternoon with any provider. KC has an opening at 4:15 and or can be seen then. Coworker will let the pt know and have pt call if this is not okay.

## 2012-09-01 NOTE — Progress Notes (Signed)
  Subjective:    Patient ID: [name], female    DOB: 29-Apr-1956, 56 y.o.   MRN: [id number]  HPI The patient comes in today for an acute sick visit.  She has known sarcoidosis, and tells me that she has issues 1-2 times a year with worsening cough as well as shortness of breath.  It will typically resolve with a short course of prednisone.  She began to have the onset of her symptoms 24 hours ago, and feels they have gotten worse.  She denies any chest congestion or purulent mucus.  She has had no worsening lower extremity edema or pleuritic chest pain.  She has not had a recent chest x-ray or pulmonary function studies.   Review of Systems  Constitutional: Negative for fever and unexpected weight change.  HENT: Negative for ear pain, nosebleeds, congestion, sore throat, rhinorrhea, sneezing, trouble swallowing, dental problem, postnasal drip and sinus pressure.   Eyes: Negative for redness and itching.  Respiratory: Positive for cough and shortness of breath. Negative for chest tightness and wheezing.   Cardiovascular: Negative for palpitations and leg swelling.  Gastrointestinal: Negative for nausea and vomiting.  Genitourinary: Negative for dysuria.  Musculoskeletal: Negative for joint swelling.  Skin: Negative for rash.  Neurological: Negative for headaches.  Hematological: Bruises/bleeds easily.  Psychiatric/Behavioral: Negative for dysphoric mood. The patient is not nervous/anxious.        Objective:   Physical Exam Overweight female in no acute distress Nose without purulence or discharge noted Oropharynx clear Chest totally clear to auscultation, no wheezing Cardiac exam is regular rate and rhythm, 2/6 systolic murmur Lower extremities with no significant edema, no cyanosis Alert and oriented, moves all 4 extremities.       Assessment & Plan:

## 2012-09-01 NOTE — Patient Instructions (Addendum)
Will treat with a short course of prednisone Will arrange for cxr, breathings studies, and followup with Dr. Sherene Sires in next 4 weeks. (all on the same day).

## 2012-09-01 NOTE — Assessment & Plan Note (Addendum)
The patient has worsening cough and dyspnea over the last 24 hours, and she tells me this is very similar to prior episodes over the last 2 years.  She typically will be treated with a course of prednisone, and returns to baseline.  I don't see anything immediate by history or exam to explain her cough, and therefore I will treat her similarly to how she's been treated in the past.  However, I will also set her up for a chest x-ray and full PFTs since she has not had these in one to 2 years, along with an ov with Dr.Wert.

## 2012-09-30 ENCOUNTER — Ambulatory Visit (INDEPENDENT_AMBULATORY_CARE_PROVIDER_SITE_OTHER): Payer: BC Managed Care – PPO | Admitting: Internal Medicine

## 2012-09-30 ENCOUNTER — Encounter: Payer: Self-pay | Admitting: Internal Medicine

## 2012-09-30 ENCOUNTER — Ambulatory Visit: Payer: BC Managed Care – PPO | Admitting: Pulmonary Disease

## 2012-09-30 ENCOUNTER — Ambulatory Visit (INDEPENDENT_AMBULATORY_CARE_PROVIDER_SITE_OTHER)
Admission: RE | Admit: 2012-09-30 | Discharge: 2012-09-30 | Disposition: A | Discharge status: Home / Self Care | Payer: BC Managed Care – PPO | Source: Ambulatory Visit | Attending: Internal Medicine | Admitting: Internal Medicine

## 2012-09-30 VITALS — BP 150/98 | HR 88 | Temp 98.7°F | Ht 66.0 in | Wt 214.0 lb

## 2012-09-30 DIAGNOSIS — R059 Cough, unspecified: Secondary | ICD-10-CM

## 2012-09-30 DIAGNOSIS — D869 Sarcoidosis, unspecified: Secondary | ICD-10-CM

## 2012-09-30 DIAGNOSIS — I10 Essential (primary) hypertension: Secondary | ICD-10-CM

## 2012-09-30 DIAGNOSIS — R05 Cough: Secondary | ICD-10-CM

## 2012-09-30 LAB — PULMONARY FUNCTION TEST

## 2012-09-30 NOTE — Patient Instructions (Addendum)
Aciphex 20 mg Take 30-60 min before first meal of the day also when coughing take Pepcid 20 mg one at bedtime   As needed for cough take delsym 2 tsp every 12 hours  GERD (REFLUX)  is an extremely common cause of respiratory symptoms, many times with no significant heartburn at all.    It can be treated with medication, but also with lifestyle changes including avoidance of late meals, excessive alcohol, smoking cessation, and avoid fatty foods, chocolate, peppermint, colas, red wine, and acidic juices such as orange juice.  NO MINT OR MENTHOL PRODUCTS SO NO COUGH DROPS  USE SUGARLESS CANDY INSTEAD (jolley ranchers or Stover's)  NO OIL BASED VITAMINS - use powdered substitutes.    Please schedule a follow up visit in 3 months but call sooner if needed

## 2012-09-30 NOTE — Progress Notes (Signed)
PFT done today. 

## 2012-09-30 NOTE — Progress Notes (Signed)
Subjective:     Patient ID: [name], female   DOB: 1956-08-11    MRN: [id number]  HPI   36 yobf minimal smoking hx admitted from 01/20/2008 through 01/27/2008 for evaluation of an FUO with cough and parenchymal pulmonary infiltrates which by transbronchial biopsy dated 01/22/2008 represented noncaseating granulomatous inflammation. 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  Last chronic prednisone 04/15/08 with no flare cough no nodules (right leg and left hand and right arm)   June 12, 2009 no symptoms on 2/3 days when doesn't take prednisone, no nausea, rash, cough, sob, articular or ocular c/o's. so stop prednisone > no trouble.   March 23, 2010 cc dry cough x 2months ever since the flu, day > night assoc with clear nasal drainage. no ocular or articular co's. rec  Prn acid suppression and gerd diet, no further prednisone needed   06/24/2011 ov/Jakaden Ouzts post ER eval.  Acute onset cough 2 weeks before going to er  And rx with prednisone > back to baseline with last prednisone 7/5 and abx = zpack now no need for rescue even sleeping or early in am>>Rx Aciphex   07/11/2011 Acute OV  Pt complains of increased SOB, wheezing, dry cough, fever - went to ED 7.16.12 and was told PNA had not cleared and given avelox x10d PT returns from ER follow up. Over last 3 weeks she has had persistent cough and intermittent fevers. Seen back in ER last week and given Avelox , cXR showed chronic vs acute on chronic changes in Right lung. She was given Avelox for 10 days. She is on 9/10 of Avelox. She is feeling better but cough is not gone. Had low grade  Fever yesterday. She has had mulitple  resp infection in past with reoccurence of cough and congestion. She denies dysphagia, reflux or vomitting. She did start on Aciphex last visit.  No discolored mucus or edema . Last steroids was 3 weeks ago, does better on prednisone with decreased cough.    rec Finish Avelox .  Begin Prednisone 20mg  daily for 4 days then 10mg  daily    07/18/2011 f/u ov/Edana Aguado cc persistent fever to 100.1  Better with aleve, cough gone, minimal arthralgias ankles. No rash or ocular c/os>>steroid ceiling /floor set.    09/09/2011 Acute OV Pt presents for an acute office visit. Complains of Pain in right lower  leg from mid calf to  to thigh. Woke up 09/08/11  with swelling and pain immediately. Painful to bear weight. No chest pain or dyspnea.  No recent travel or known injury. No fever or new meds. No on HRT.  No a smoker.  No hx of DVT. No recent surgery.  rec  Keep leg elevated.  Warm compresses to leg  We are setting you up for a venous doppler to evaluate your right leg swelling.  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 Confused  with meds but this may have happpened p changed to non diuretic bp regimen - not following previous instructions on prednisone.  No arthralgias/ rash.>>Lasix rx   10/15/2011 Follow up  Pt presents for follow up and med review. We reviewed all her meds and organized them into a med calendar with pt education. IT appears she has stopped her prednisone on her own. No increased cough or dsyppnea.   She is feeling better with less swelling.  >med calendar   05/04/2012 Acute OV  Complains of  c/o sob and dry cough x 1 week. Also c/o a sore chest possibly from cough.  Cough is severe at times.  Is taking phenergan w/ codeine cough syrup with some help/  No discolored mucus or fever   No rash.   09/30/2012 pulmonary ov/ Florencia Zaccaro/ no med calendar, not on prednisone, took flu shot one week prior to OV  And woke up am of ov with chills, dry cough no st cp or arthralgias, did not try otcs. No sob  Sleeping ok without nocturnal  or early am exacerbation  of respiratory  c/o's or need for noct saba. Also denies any obvious fluctuation of symptoms with weather or environmental changes or other aggravating or alleviating factors except as outlined above  ROS  The following are not active complaints unless bolded sore throat, dysphagia, dental problems, itching, sneezing,  nasal congestion or excess/ purulent secretions, ear ache,   fever, chills, sweats, unintended wt loss, pleuritic or exertional cp, hemoptysis,  orthopnea pnd or leg swelling, presyncope, palpitations, heartburn, abdominal pain, anorexia, nausea, vomiting, diarrhea  or change in bowel or urinary habits, change in stools or urine, dysuria,hematuria,  rash, arthralgias, visual complaints, headache, numbness weakness or ataxia or problems with walking or coordination,  change in mood/affect or memory.           Allergies   1) ! 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)  OBESITY (ICD-278.00)  SARCOIDOSIS (ICD-135) originally evaluated elsewhere in about 1996 with no tissue diagnosis  - TBBX pos ncg 01/22/08  - Chronic pred rx 2/09> 04/2008  - Restart Pred December 26, 2008 > June 12, 2009 stopped > restarted daily rx  07/11/11 - PFT's March 23, 2010 VC 1.77 (66%) no obst, ERV 34%, DLCO 53 > 115% corrected  G E R D (ICD-530.81)  Rectal cancer status post chemotherapy and radiation July 2005  Right calf pain/swelling 09/09/2011 >> neg venous doppler  Neg Complex med regimen>>med calendar 10/15/2011                             Objective:   Physical Exam Ambulatory mod obese bf in no acute distress.  wt 216 November 24, 2008 > 220  December 26, 2008 > > 07/18/2011  218 >>  221 09/09/2011 > 224 10/01/2011 >>223 10/15/2011 >223 05/04/2012 > 214 09/30/2012  HEENT: nml  turbinates, and orophanx. Nl external ear canals without cough reflex  Neck without JVD/Nodes/TM  Lungs coarse BS w/ no wheezing  RRR no s3 or murmur or increase in P2  Abd soft and benign with nl excursion in the supine position. No bruits or organomegaly  Ext warm with  Tr edema  Skin warm and dry without lesions      CXR  09/30/2012 :  Stable chronic densities in the lung bases, likely scarring. Cardiomegaly. No acute findings or change.  Assessment:          Plan:

## 2012-10-01 NOTE — Assessment & Plan Note (Signed)
Explained natural history of uri and why it's necessary in patients at risk to treat GERD aggressively  at least  short term   to reduce risk of evolving cyclical cough initially  triggered by epithelial injury and a heightened sensitivty to the effects of any upper airway irritants,  most importantly acid - related.  That is, the more sensitive the epithelium damaged for virus, the more the cough, the more the secondary reflux (especially in those prone to reflux) the more the irritation of the sensitive mucosa and so on in a cyclical pattern.  

## 2012-10-01 NOTE — Assessment & Plan Note (Signed)
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  - Chronic pred rx 2/09> 04/2008  - Restart Pred December 26, 2008 > June 12, 2009 stopped  > restarted 07/11/11>>pt stopped 10/15/11  - PFT's March 23, 2010 VC 1.77 (66%) no obst, ERV 34%, DLCO 53 > 115% corrected - PFT's 09/30/12       VC 2.02 (57%) ratio 78 , erv 1`3%  DLCO 55 > 118% corrected  No evidence of active sarcoid, main issue is low erv related to obesity > remain off steroids

## 2012-10-01 NOTE — Assessment & Plan Note (Signed)
Not Adequate control on present rx, reviewed ? Adherent, not following med calendar > reviewed meds/ diet, needs f/u

## 2012-10-07 ENCOUNTER — Telehealth: Payer: Self-pay | Admitting: Internal Medicine

## 2012-10-07 NOTE — Telephone Encounter (Signed)
Returning call can be reached at [phone].[name]

## 2012-10-07 NOTE — Telephone Encounter (Signed)
Result Note     Call pt: Reviewed cxr and no acute change so no change in recommendations made at ov  --  lmomtcb x1 

## 2012-10-07 NOTE — Telephone Encounter (Signed)
I spoke with patient about results and she verbalized understanding and had no questions 

## 2012-10-14 ENCOUNTER — Encounter: Payer: Self-pay | Admitting: Internal Medicine

## 2012-10-16 ENCOUNTER — Encounter (HOSPITAL_COMMUNITY): Payer: Self-pay | Admitting: Family Medicine

## 2012-10-16 ENCOUNTER — Emergency Department (HOSPITAL_COMMUNITY)
Admission: EM | Admit: 2012-10-16 | Discharge: 2012-10-16 | Disposition: A | Discharge status: Home / Self Care | Payer: BC Managed Care – PPO | Attending: Emergency Medicine | Admitting: Emergency Medicine

## 2012-10-16 DIAGNOSIS — Z85048 Personal history of other malignant neoplasm of rectum, rectosigmoid junction, and anus: Secondary | ICD-10-CM | POA: Insufficient documentation

## 2012-10-16 DIAGNOSIS — Z79899 Other long term (current) drug therapy: Secondary | ICD-10-CM | POA: Insufficient documentation

## 2012-10-16 DIAGNOSIS — K219 Gastro-esophageal reflux disease without esophagitis: Secondary | ICD-10-CM | POA: Insufficient documentation

## 2012-10-16 DIAGNOSIS — R109 Unspecified abdominal pain: Secondary | ICD-10-CM | POA: Insufficient documentation

## 2012-10-16 DIAGNOSIS — R911 Solitary pulmonary nodule: Secondary | ICD-10-CM | POA: Insufficient documentation

## 2012-10-16 DIAGNOSIS — I10 Essential (primary) hypertension: Secondary | ICD-10-CM | POA: Insufficient documentation

## 2012-10-16 DIAGNOSIS — D869 Sarcoidosis, unspecified: Secondary | ICD-10-CM | POA: Insufficient documentation

## 2012-10-16 DIAGNOSIS — E669 Obesity, unspecified: Secondary | ICD-10-CM | POA: Insufficient documentation

## 2012-10-16 DIAGNOSIS — Z880 Allergy status to penicillin: Secondary | ICD-10-CM | POA: Insufficient documentation

## 2012-10-16 DIAGNOSIS — N390 Urinary tract infection, site not specified: Secondary | ICD-10-CM | POA: Insufficient documentation

## 2012-10-16 DIAGNOSIS — Z87891 Personal history of nicotine dependence: Secondary | ICD-10-CM | POA: Insufficient documentation

## 2012-10-16 LAB — CBC WITH DIFFERENTIAL/PLATELET
Basophils Relative: 0 % (ref 0–1)
Eosinophils Absolute: 0.1 10*3/uL (ref 0.0–0.7)
Eosinophils Relative: 0 % (ref 0–5)
HCT: 32.3 % — ABNORMAL LOW (ref 36.0–46.0)
Hemoglobin: 11.7 g/dL — ABNORMAL LOW (ref 12.0–15.0)
MCH: 32.3 pg (ref 26.0–34.0)
MCHC: 36.2 g/dL — ABNORMAL HIGH (ref 30.0–36.0)
MCV: 89.2 fL (ref 78.0–100.0)
Monocytes Absolute: 1.2 10*3/uL — ABNORMAL HIGH (ref 0.1–1.0)
Monocytes Relative: 8 % (ref 3–12)
Neutro Abs: 12.9 10*3/uL — ABNORMAL HIGH (ref 1.7–7.7)
RDW: 14 % (ref 11.5–15.5)

## 2012-10-16 LAB — COMPREHENSIVE METABOLIC PANEL
ALT: 22 U/L (ref 0–35)
AST: 20 U/L (ref 0–37)
Albumin: 3.4 g/dL — ABNORMAL LOW (ref 3.5–5.2)
Alkaline Phosphatase: 129 U/L — ABNORMAL HIGH (ref 39–117)
Calcium: 9.2 mg/dL (ref 8.4–10.5)
Potassium: 3.7 mEq/L (ref 3.5–5.1)
Sodium: 141 mEq/L (ref 135–145)
Total Protein: 8.2 g/dL (ref 6.0–8.3)

## 2012-10-16 LAB — URINALYSIS, ROUTINE W REFLEX MICROSCOPIC
Hgb urine dipstick: NEGATIVE
Protein, ur: 30 mg/dL — AB
Specific Gravity, Urine: 1.029 (ref 1.005–1.030)
Urobilinogen, UA: 1 mg/dL (ref 0.0–1.0)

## 2012-10-16 LAB — URINE MICROSCOPIC-ADD ON

## 2012-10-16 MED ORDER — HYDROCODONE-ACETAMINOPHEN 5-325 MG PO TABS: 2.0000 | ORAL_TABLET | ORAL | Status: DC | PRN

## 2012-10-16 MED ORDER — CIPROFLOXACIN HCL 500 MG PO TABS: 500.0000 mg | ORAL_TABLET | Freq: Two times a day (BID) | ORAL | Status: DC

## 2012-10-16 MED ORDER — DEXTROSE 5 % IV SOLN
1.0000 g | Freq: Once | INTRAVENOUS | Status: AC
  Administered 2012-10-16: 1 g via INTRAVENOUS
  Filled 2012-10-16: qty 10

## 2012-10-16 NOTE — ED Notes (Addendum)
Per pt fever, chills, weakness, and lower abdominal pain since yesterday. sts urine darker. Denies N,V,D. Pt sts she took tylenol about 6 am

## 2012-10-17 LAB — URINE CULTURE: Culture: NO GROWTH

## 2012-10-17 NOTE — ED Provider Notes (Signed)
History     CSN: [id number]  Arrival date & time 10/16/12  1114   First MD Initiated Contact with Patient 10/16/12 1316      Chief Complaint  Patient presents with  . Fever    (Consider location/radiation/quality/duration/timing/severity/associated sxs/prior treatment) Patient is a 56 y.o. female presenting with fever. The history is provided by the patient.  Fever Primary symptoms of the febrile illness include fever and abdominal pain. Primary symptoms do not include cough, wheezing, shortness of breath, vomiting, diarrhea, altered mental status, arthralgias or rash. The current episode started yesterday. This is a new problem. The problem has been gradually worsening.    Past Medical History  Diagnosis Date  . Hypertension   . Pulmonary nodule, left   . Obesity   . Sarcoidosis   . GERD (gastroesophageal reflux disease)   . Rectal cancer     History reviewed. No pertinent past surgical history.  Family History  Problem Relation Age of Onset  . Allergies Mother   . Allergies Sister   . Allergies Brother   . Asthma Mother   . Sarcoidosis Neg Hx     History  Substance Use Topics  . Smoking status: Former Smoker -- 0.5 packs/day for 1 years    Types: Cigarettes    Quit date: 12/16/1993  . Smokeless tobacco: Never Used   Comment: smoked breifly while in college  . Alcohol Use: No    OB History    Grav Para Term Preterm Abortions TAB SAB Ect Mult Living                  Review of Systems  Constitutional: Positive for fever.  Respiratory: Negative for cough, shortness of breath and wheezing.   Gastrointestinal: Positive for abdominal pain. Negative for vomiting and diarrhea.  Musculoskeletal: Negative for arthralgias.  Skin: Negative for rash.  Psychiatric/Behavioral: Negative for altered mental status.  All other systems reviewed and are negative.    Allergies  Penicillins  Home Medications   Current Outpatient Rx  Name Route Sig Dispense Refill    . ALBUTEROL SULFATE HFA 108 (90 BASE) MCG/ACT IN AERS Inhalation Inhale 2 puffs into the lungs every 6 (six) hours as needed. 1 Inhaler 3  . AMLODIPINE-VALSARTAN-HCTZ 10-160-12.5 MG PO TABS Oral Take 1 tablet by mouth daily.     Marland Kitchen RABEPRAZOLE SODIUM 20 MG PO TBEC Oral Take 20 mg by mouth daily.     Marland Kitchen CIPROFLOXACIN HCL 500 MG PO TABS Oral Take 1 tablet (500 mg total) by mouth 2 (two) times daily. 20 tablet 0  . HYDROCODONE-ACETAMINOPHEN 5-325 MG PO TABS Oral Take 2 tablets by mouth every 4 (four) hours as needed for pain. 10 tablet 0    BP 143/74  Pulse 81  Temp 100 F (37.8 C) (Oral)  Resp 18  SpO2 93%  Physical Exam  Nursing note and vitals reviewed. Constitutional: She is oriented to person, place, and time. She appears well-developed and well-nourished. No distress.  HENT:  Head: Normocephalic and atraumatic.  Eyes: Pupils are equal, round, and reactive to light.  Neck: Normal range of motion.  Cardiovascular: Normal rate and intact distal pulses.   Pulmonary/Chest: No respiratory distress.  Abdominal: Normal appearance and bowel sounds are normal. She exhibits no distension. There is tenderness.    Musculoskeletal: Normal range of motion.  Neurological: She is alert and oriented to person, place, and time. No cranial nerve deficit.  Skin: Skin is warm and dry. No rash noted.  Psychiatric:  She has a normal mood and affect. Her behavior is normal.    ED Course  Procedures (including critical care time)  Labs Reviewed  URINALYSIS, ROUTINE W REFLEX MICROSCOPIC - Abnormal; Notable for the following:    Color, Urine ORANGE (*)  BIOCHEMICALS MAY BE AFFECTED BY COLOR   APPearance CLOUDY (*)     Bilirubin Urine SMALL (*)     Ketones, ur 15 (*)     Protein, ur 30 (*)     Leukocytes, UA MODERATE (*)     All other components within normal limits  COMPREHENSIVE METABOLIC PANEL - Abnormal; Notable for the following:    Glucose, Bld 134 (*)     Albumin 3.4 (*)     Alkaline  Phosphatase 129 (*)     Total Bilirubin 1.6 (*)     All other components within normal limits  CBC WITH DIFFERENTIAL - Abnormal; Notable for the following:    WBC 15.7 (*)     RBC 3.62 (*)     Hemoglobin 11.7 (*)     HCT 32.3 (*)     MCHC 36.2 (*)     Neutrophils Relative 82 (*)     Neutro Abs 12.9 (*)     Lymphocytes Relative 10 (*)     Monocytes Absolute 1.2 (*)     All other components within normal limits  URINE MICROSCOPIC-ADD ON - Abnormal; Notable for the following:    Squamous Epithelial / LPF MANY (*)     Bacteria, UA MANY (*)     All other components within normal limits  URINE CULTURE   No results found.   1. UTI (lower urinary tract infection)       MDM          Nelia Shi, MD 10/17/12 1029

## 2012-11-05 ENCOUNTER — Other Ambulatory Visit: Payer: Self-pay | Admitting: Obstetrics and Gynecology

## 2012-11-05 DIAGNOSIS — R921 Mammographic calcification found on diagnostic imaging of breast: Secondary | ICD-10-CM

## 2012-11-17 ENCOUNTER — Ambulatory Visit
Admission: RE | Admit: 2012-11-17 | Discharge: 2012-11-17 | Disposition: A | Discharge status: Home / Self Care | Payer: BC Managed Care – PPO | Source: Ambulatory Visit | Attending: Obstetrics and Gynecology | Admitting: Obstetrics and Gynecology

## 2012-11-17 DIAGNOSIS — R921 Mammographic calcification found on diagnostic imaging of breast: Secondary | ICD-10-CM

## 2012-12-24 ENCOUNTER — Telehealth: Payer: Self-pay | Admitting: Internal Medicine

## 2012-12-24 NOTE — Telephone Encounter (Signed)
Called pt x 3 to make next ov per recall.  Pt never returned our calls.  Mailed recall letter 12/24/12. Emily E McAlister  °

## 2013-01-12 ENCOUNTER — Ambulatory Visit
Admission: RE | Admit: 2013-01-12 | Discharge: 2013-01-12 | Disposition: A | Discharge status: Home / Self Care | Payer: BC Managed Care – PPO | Source: Ambulatory Visit | Attending: Cardiology | Admitting: Cardiology

## 2013-01-12 ENCOUNTER — Other Ambulatory Visit: Payer: Self-pay | Admitting: Cardiology

## 2013-01-12 DIAGNOSIS — R05 Cough: Secondary | ICD-10-CM

## 2013-01-12 DIAGNOSIS — R059 Cough, unspecified: Secondary | ICD-10-CM

## 2013-02-17 ENCOUNTER — Ambulatory Visit (INDEPENDENT_AMBULATORY_CARE_PROVIDER_SITE_OTHER)
Admission: RE | Admit: 2013-02-17 | Discharge: 2013-02-17 | Disposition: A | Discharge status: Home / Self Care | Payer: BC Managed Care – PPO | Source: Ambulatory Visit | Attending: Internal Medicine | Admitting: Internal Medicine

## 2013-02-17 ENCOUNTER — Encounter: Payer: Self-pay | Admitting: Internal Medicine

## 2013-02-17 ENCOUNTER — Ambulatory Visit (INDEPENDENT_AMBULATORY_CARE_PROVIDER_SITE_OTHER): Payer: BC Managed Care – PPO | Admitting: Internal Medicine

## 2013-02-17 ENCOUNTER — Encounter: Payer: Self-pay | Admitting: *Deleted

## 2013-02-17 ENCOUNTER — Telehealth: Payer: Self-pay | Admitting: Internal Medicine

## 2013-02-17 VITALS — BP 160/90 | HR 100 | Temp 98.6°F | Ht 65.5 in | Wt 212.6 lb

## 2013-02-17 DIAGNOSIS — R059 Cough, unspecified: Secondary | ICD-10-CM

## 2013-02-17 DIAGNOSIS — R05 Cough: Secondary | ICD-10-CM

## 2013-02-17 DIAGNOSIS — D869 Sarcoidosis, unspecified: Secondary | ICD-10-CM

## 2013-02-17 MED ORDER — TRAMADOL HCL 50 MG PO TABS: ORAL_TABLET | ORAL | Status: DC

## 2013-02-17 MED ORDER — LEVOFLOXACIN 750 MG PO TABS: 750.0000 mg | ORAL_TABLET | Freq: Every day | ORAL | Status: DC

## 2013-02-17 NOTE — Telephone Encounter (Signed)
Called and spoke with pt and she stated that she started this morning with cough--dry, fever of 103 and chills.  Pt requested an appt with MW.  Added her on today at 2:45 and pt is aware. Nothing further is needed.

## 2013-02-17 NOTE — Patient Instructions (Addendum)
Aciphex 20 mg Take 30-60 min before first meal of the day also when coughing for any reason take Pepcid 20 mg one at bedtime   Take delsym two tsp every 12 hours and supplement if needed with  tramadol 50 mg up to 2 every 4 hours to suppress the urge to cough. Swallowing water or using ice chips/non mint and menthol containing candies (such as lifesavers or sugarless jolly ranchers) are also effective.  You should rest your voice and avoid activities that you know make you cough.  Once you have eliminated the cough for 3 straight days try reducing the tramadol first,  then the delsym as tolerated.    GERD (REFLUX)  is an extremely common cause of respiratory symptoms, many times with no significant heartburn at all.    It can be treated with medication, but also with lifestyle changes including avoidance of late meals, excessive alcohol, smoking cessation, and avoid fatty foods, chocolate, peppermint, colas, red wine, and acidic juices such as orange juice.  NO MINT OR MENTHOL PRODUCTS SO NO COUGH DROPS  USE SUGARLESS CANDY INSTEAD (jolley ranchers or Stover's)  NO OIL BASED VITAMINS - use powdered substitutes.  Levaquin 750 mg one daily x 5 days  Please remember to go to   and x-ray department downstairs for your tests - we will call you with the results when they are available.  Please schedule a follow up office visit in 2 weeks, sooner if needed

## 2013-02-17 NOTE — Assessment & Plan Note (Signed)
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  - Chronic pred rx 2/09> 04/2008  - Restart Pred December 26, 2008 > June 12, 2009 stopped  > restarted 07/11/11>>pt stopped 10/15/11  - PFT's March 23, 2010 VC 1.77 (66%) no obst, ERV 34%, DLCO 53 > 115% corrected - PFT's 09/30/12       VC 2.02 (57%) ratio 78 , erv 1`3%  DLCO 55 > 118% corrected  No evidence active pulmonary or systemic sarcoid or need for chronic steroids.

## 2013-02-17 NOTE — Assessment & Plan Note (Signed)
Explained natural history of uri and why it's necessary in patients at risk to treat GERD aggressively  at least  short term   to reduce risk of evolving cyclical cough initially  triggered by epithelial injury and a heightened sensitivty to the effects of any upper airway irritants,  most importantly acid - related.  That is, the more sensitive the epithelium damaged for virus, the more the cough, the more the secondary reflux (especially in those prone to reflux) the more the irritation of the sensitive mucosa and so on in a cyclical pattern.   For now max rx gerd rx and empiric rx with levaquin and prednisone then regroup

## 2013-02-17 NOTE — Progress Notes (Signed)
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  Last chronic prednisone 04/15/08 with no flare cough no nodules (right leg and left hand and right arm)   June 12, 2009 no symptoms on 2/3 days when doesn't take prednisone, no nausea, rash, cough, sob, articular or ocular c/o's. so stop prednisone > no trouble.   March 23, 2010 cc dry cough x 2months ever since the flu, day > night assoc with clear nasal drainage. no ocular or articular co's. rec  Prn acid suppression and gerd diet, no further prednisone needed   06/24/2011 ov/Wert post ER eval.  Acute onset cough 2 weeks before going to er  And rx with prednisone > back to baseline with last prednisone 7/5 and abx = zpack now no need for rescue even sleeping or early in am>>Rx Aciphex   07/11/2011 Acute OV  Pt complains of increased SOB, wheezing, dry cough, fever - went to ED 7.16.12 and was told PNA had not cleared and given avelox x10d PT returns from ER follow up. Over last 3 weeks she has had persistent cough and intermittent fevers. Seen back in ER last week and given Avelox , cXR showed chronic vs acute on chronic changes in Right lung. She was given Avelox for 10 days. She is on 9/10 of Avelox. She is feeling better but cough is not gone. Had low grade  Fever yesterday. She has had mulitple  resp infection in past with reoccurence of cough and congestion. She denies dysphagia, reflux or vomitting. She did start on Aciphex last visit.  No discolored mucus or edema . Last steroids was 3 weeks ago, does better on prednisone with decreased cough.   rec Finish Avelox .  Begin Prednisone 20mg  daily for 4 days then 10mg  daily    07/18/2011 f/u ov/Wert cc persistent fever to 100.1  Better with aleve, cough gone, minimal arthralgias ankles. No rash or ocular c/os>>steroid ceiling /floor set.    09/09/2011 Acute OV Pt presents for an acute office visit. Complains of Pain in right lower  leg from mid calf to  to thigh. Woke up 09/08/11  with swelling and pain immediately. Painful to bear weight. No chest pain or dyspnea.  No recent travel or known injury. No fever or new meds. No on HRT.  No a smoker.  No hx of DVT. No recent surgery.  rec  Keep leg elevated.  Warm compresses to leg  We are setting you up for a venous doppler to evaluate your right leg swelling.  I will call you with results and further instructions  Please contact office for sooner follow up if symptoms do not improve or worsen or seek emergency care   Late ADD Preliminary report from doppler - neg for DVt --suspect cellultis w/ enlarged lymph node  Keflex 500mg  Four times a day  For 7 days  follow up .1 week and As needed   Please contact office for sooner follow up if symptoms do not improve or worsen or seek emergency care   10/01/11 Wert/ ov cc both legs swollen, no orthopnea or doe. Confused with  meds but this may have happpened p changed to non diuretic bp regimen - not following previous instructions on prednisone.  No arthralgias/ rash.>>Lasix rx   10/15/2011 Follow up  Pt presents for follow up and med review. We reviewed all her meds and organized them into a med calendar with pt education. IT appears she has stopped her prednisone on her own. No increased cough or dsyppnea.   She is feeling better with less swelling.  >med calendar   05/04/2012 Acute OV  Complains of  c/o sob and dry cough x 1 week. Also c/o a sore chest possibly from cough.  Cough is severe at times.  Is taking phenergan w/ codeine cough syrup with some help/  No discolored mucus or fever   No rash.   09/30/2012 pulmonary ov/ Wert/ no med calendar, not on prednisone, took flu shot one week prior to OV  And woke up am of ov with chills, dry cough no st cp or arthralgias, did not try otcs.  rec Aciphex 20 mg Take 30-60 min before first meal of the day also when coughing take Pepcid 20 mg one at bedtime  As needed for cough take delsym 2 tsp every 12 hours GERD diet   02/17/2013 f/u ov/Wert cc fine until late January then rx prednisone and abx for chills and dry cough abrupt onset rx cipro better then relapsed am of ov with "exact same symptoms but worse".  No sign sob, just generally weak.  Sleeping ok without nocturnal  or early am exacerbation  of respiratory  c/o's or need for noct saba. Also denies any obvious fluctuation of symptoms with weather or environmental changes or other aggravating or alleviating factors except as outlined above  ROS  The following are not active complaints unless bolded sore throat, dysphagia, dental problems, itching, sneezing,  nasal congestion or excess/ purulent secretions, ear ache,   fever, chills, sweats, unintended wt loss, pleuritic or exertional cp, hemoptysis,  orthopnea pnd or leg swelling, presyncope, palpitations, heartburn, abdominal pain, anorexia, nausea, vomiting, diarrhea  or change in bowel or urinary habits, change in stools or urine, dysuria,hematuria,  rash, arthralgias, visual complaints, headache, numbness weakness or ataxia or problems with walking or coordination,  change in mood/affect or memory.           Allergies   1) ! 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)  OBESITY (ICD-278.00)  SARCOIDOSIS (ICD-135) originally evaluated elsewhere in about 1996 with no tissue diagnosis  - TBBX pos ncg 01/22/08  - Chronic pred rx 2/09> 04/2008  - Restart Pred December 26, 2008 > June 12, 2009 stopped > restarted daily rx  07/11/11 - PFT's March 23, 2010 VC 1.77  (66%) no obst, ERV 34%, DLCO 53 > 115% corrected  G E R D (ICD-530.81)  Rectal cancer status post chemotherapy and radiation July 2005  Right calf pain/swelling 09/09/2011 >> neg venous doppler  Neg Complex med regimen>>med calendar 10/15/2011                             Objective:   Physical Exam Ambulatory mod obese bf in no acute distress.  wt 216 November 24, 2008 > 220 December 26, 2008 > > 07/18/2011  218 >>  221 09/09/2011 > 224 10/01/2011 >>223 10/15/2011 >223 05/04/2012 > 214 09/30/2012 >  212 02/17/2013  HEENT: nml  turbinates, and orophanx. Nl external  ear canals without cough reflex  Neck without JVD/Nodes/TM  Lungs coarse BS w/ no wheezing  RRR no s3 or murmur or increase in P2  Abd soft and benign with nl excursion in the supine position. No bruits or organomegaly  Ext warm with  Tr edema  Skin warm and dry without lesions    CXR  02/17/2013 :  Grossly abnormal appearance of the chest, similar to prior  examinations. Given the stability over time, this is presumably  related to chronic changes from underlying sarcoidosis, however,  the distribution of disease is rather unusual with the lower lung  predominance.       Assessment:          Plan:

## 2013-02-18 NOTE — Progress Notes (Signed)
Quick Note:  Spoke with pt and notified of results per Dr. Wert. Pt verbalized understanding and denied any questions.  ______ 

## 2013-06-22 ENCOUNTER — Encounter: Payer: Self-pay | Admitting: Family

## 2013-06-22 ENCOUNTER — Ambulatory Visit (INDEPENDENT_AMBULATORY_CARE_PROVIDER_SITE_OTHER): Payer: BC Managed Care – PPO | Admitting: Family

## 2013-06-22 VITALS — BP 148/90 | HR 86 | Ht 65.5 in | Wt 209.0 lb

## 2013-06-22 DIAGNOSIS — R509 Fever, unspecified: Secondary | ICD-10-CM

## 2013-06-22 DIAGNOSIS — Z85048 Personal history of other malignant neoplasm of rectum, rectosigmoid junction, and anus: Secondary | ICD-10-CM

## 2013-06-22 LAB — POCT URINALYSIS DIPSTICK
Bilirubin, UA: NEGATIVE
Glucose, UA: NEGATIVE
Ketones, UA: NEGATIVE
Leukocytes, UA: NEGATIVE
Nitrite, UA: NEGATIVE
pH, UA: 5.5

## 2013-06-22 MED ORDER — TRIAMCINOLONE 0.1 % CREAM:EUCERIN CREAM 1:1: 1.0000 "application " | TOPICAL_CREAM | Freq: Two times a day (BID) | CUTANEOUS | Status: DC

## 2013-06-22 NOTE — Progress Notes (Signed)
Subjective:    Patient ID: [name], female    DOB: 1956-05-18, 57 y.o.   MRN: [id number]  HPI  Pt is a 57 year old [place] American female who presents to PCP to be established as a new pt. Pt reports having recurrent fevers and chills for several years. Pt also reports that a rash to buttock is accompanies the fevers and chills. States sx last for 2-3 days and resolve on their own. Pt reports being a survivor of rectal cancer and is concerned that rash to buttocks and fever/chills is a possible complication of rectal cancer.   Review of Systems  Constitutional: Positive for fever and chills.  HENT: Negative.   Eyes: Negative.   Respiratory: Negative.   Cardiovascular: Negative.   Gastrointestinal: Negative.   Endocrine: Negative.   Genitourinary: Negative.   Musculoskeletal: Negative.   Skin: Positive for rash.  Allergic/Immunologic: Negative.   Neurological: Negative.   Hematological: Negative.   Psychiatric/Behavioral: Negative.    Past Medical History  Diagnosis Date  . Hypertension   . Pulmonary nodule, left   . Obesity   . Sarcoidosis   . GERD (gastroesophageal reflux disease)   . Rectal cancer     History   Social History  . Marital Status: Single    Spouse Name: N/A    Number of Children: N/A  . Years of Education: N/A   Occupational History  . Not on file.   Social History Main Topics  . Smoking status: Former Smoker -- 0.50 packs/day for 1 years    Types: Cigarettes    Quit date: 12/16/1993  . Smokeless tobacco: Never Used     Comment: smoked breifly while in college  . Alcohol Use: No  . Drug Use: No  . Sexually Active: Not on file   Other Topics Concern  . Not on file   Social History Narrative  . No narrative on file    No past surgical history on file.  Family History  Problem Relation Age of Onset  . Allergies Mother   . Allergies Sister   . Allergies Brother   . Asthma Mother   . Sarcoidosis Neg Hx     Allergies   Allergen Reactions  . Penicillins Hives    Current Outpatient Prescriptions on File Prior to Visit  Medication Sig Dispense Refill  . albuterol (VENTOLIN HFA) 108 (90 BASE) MCG/ACT inhaler Inhale 2 puffs into the lungs every 6 (six) hours as needed.  1 Inhaler  3  . Amlodipine-Valsartan-HCTZ (EXFORGE HCT) 10-160-12.5 MG TABS Take 1 tablet by mouth daily.       Marland Kitchen levofloxacin (LEVAQUIN) 750 MG tablet Take 1 tablet (750 mg total) by mouth daily. One daily stop if develop aching in joints/ muscles  5 tablet  0  . RABEprazole (ACIPHEX) 20 MG tablet Take 20 mg by mouth daily.       . traMADol (ULTRAM) 50 MG tablet 1-2 every 4 hours as needed for cough or pain  40 tablet  0   No current facility-administered medications on file prior to visit.    BP 148/90  Pulse 86  Ht 5' 5.5" (1.664 m)  Wt 209 lb (94.802 kg)  BMI 34.24 kg/m2  SpO2 97%chart    Objective:   Physical Exam  Constitutional: She is oriented to person, place, and time. She appears well-developed and well-nourished.  HENT:  Head: Normocephalic and atraumatic.  Eyes: Conjunctivae are normal. Pupils are equal, round, and reactive to light.  Neck: Normal range of motion. Neck supple.  Cardiovascular: Normal rate and regular rhythm.   Pulmonary/Chest: Effort normal and breath sounds normal.  Abdominal: Soft. Bowel sounds are normal.  Musculoskeletal: Normal range of motion.  Neurological: She is alert and oriented to person, place, and time.  Skin: Rash noted.  Erythema noted to bilateral outer buttocks Extreme dryness and excoriation to bilateral mid buttocks 6-8 vesicular lesions to bilateral inner buttocks          Assessment & Plan:  1. History of rectal cancer 2. Fever and Chills  Eucerin and Triamcinolone cream prescribed to apply to affected area BID. TSH, CMP, CBC and U/A ordered. Referral made to GI to follow up on hx of rectal cancer. Pt instructed to follow up in 2 weeks for physical and to assess status  of rash. Pt to follow up with PCP sooner with any questions or concerns.   Note by Davonna Belling, FNP Student

## 2013-06-23 ENCOUNTER — Encounter: Payer: Self-pay | Admitting: Family

## 2013-06-23 LAB — COMPREHENSIVE METABOLIC PANEL
AST: 24 U/L (ref 0–37)
Albumin: 4 g/dL (ref 3.5–5.2)
BUN: 16 mg/dL (ref 6–23)
CO2: 32 mEq/L (ref 19–32)
Calcium: 9.4 mg/dL (ref 8.4–10.5)
Chloride: 105 mEq/L (ref 96–112)
Creatinine, Ser: 0.8 mg/dL (ref 0.4–1.2)
GFR: 101.78 mL/min (ref >60.00)
Potassium: 3.9 mEq/L (ref 3.5–5.1)

## 2013-06-23 LAB — CBC WITH DIFFERENTIAL/PLATELET
Basophils Relative: 0.3 % (ref 0.0–3.0)
Eosinophils Relative: 3.6 % (ref 0.0–5.0)
HCT: 35.5 % — ABNORMAL LOW (ref 36.0–46.0)
Hemoglobin: 12.1 g/dL (ref 12.0–15.0)
Lymphocytes Relative: 18.9 % (ref 12.0–46.0)
Lymphs Abs: 1.3 10*3/uL (ref 0.7–4.0)
Monocytes Relative: 10.7 % (ref 3.0–12.0)
Neutro Abs: 4.7 10*3/uL (ref 1.4–7.7)
RBC: 3.78 Mil/uL — ABNORMAL LOW (ref 3.87–5.11)
WBC: 7 10*3/uL (ref 4.5–10.5)

## 2013-07-06 ENCOUNTER — Encounter: Payer: Self-pay | Admitting: Family

## 2013-07-06 ENCOUNTER — Encounter: Payer: BC Managed Care – PPO | Admitting: Family

## 2013-07-06 ENCOUNTER — Ambulatory Visit (INDEPENDENT_AMBULATORY_CARE_PROVIDER_SITE_OTHER): Payer: BC Managed Care – PPO | Admitting: Family

## 2013-07-06 VITALS — BP 162/94 | HR 88 | Wt 209.0 lb

## 2013-07-06 DIAGNOSIS — Z0000 Encounter for general adult medical examination without abnormal findings: Secondary | ICD-10-CM

## 2013-07-06 DIAGNOSIS — I10 Essential (primary) hypertension: Secondary | ICD-10-CM

## 2013-07-06 DIAGNOSIS — Z23 Encounter for immunization: Secondary | ICD-10-CM

## 2013-07-06 LAB — LDL CHOLESTEROL, DIRECT: Direct LDL: 163.8 mg/dL

## 2013-07-06 LAB — LIPID PANEL
HDL: 33.3 mg/dL — ABNORMAL LOW (ref >39.00)
Triglycerides: 256 mg/dL — ABNORMAL HIGH (ref 0.0–149.0)

## 2013-07-06 NOTE — Addendum Note (Signed)
Addended by: Beverely Low on: 07/06/2013 10:05 AM   Modules accepted: Orders

## 2013-07-06 NOTE — Patient Instructions (Addendum)
Follow up in 6 months 

## 2013-07-06 NOTE — Progress Notes (Signed)
Subjective:    Patient ID: [name], female    DOB: [birth year], 57 y.o.   MRN: [id number]  HPI   This is a routine physical examination for this healthy  Female. Reviewed all health maintenance protocols including mammography colonoscopy bone density and reviewed appropriate screening labs. Her immunization history was reviewed as well as her current medications and allergies refills of her chronic medications were given and the plan for yearly health maintenance was discussed all orders and referrals were made as appropriate.  Review of Systems  Constitutional: Negative.   HENT: Negative.   Eyes: Negative.   Respiratory: Negative.   Cardiovascular: Negative.   Gastrointestinal: Negative.   Endocrine: Negative.   Genitourinary: Negative.   Musculoskeletal: Negative.   Skin: Negative.   Allergic/Immunologic: Negative.   Neurological: Negative.   Hematological: Negative.   Psychiatric/Behavioral: Negative.    Past Medical History  Diagnosis Date  . Hypertension   . Pulmonary nodule, left   . Obesity   . Sarcoidosis   . GERD (gastroesophageal reflux disease)   . Rectal cancer     History   Social History  . Marital Status: Single    Spouse Name: N/A    Number of Children: N/A  . Years of Education: N/A   Occupational History  . Not on file.   Social History Main Topics  . Smoking status: Former Smoker -- 0.50 packs/day for 1 years    Types: Cigarettes    Quit date: 12/16/1993  . Smokeless tobacco: Never Used     Comment: smoked breifly while in college  . Alcohol Use: No  . Drug Use: No  . Sexually Active: Not on file   Other Topics Concern  . Not on file   Social History Narrative  . No narrative on file    No past surgical history on file.  Family History  Problem Relation Age of Onset  . Allergies Mother   . Allergies Sister   . Allergies Brother   . Asthma Mother   . Sarcoidosis Neg Hx     Allergies  Allergen Reactions  . Penicillins  Hives    Current Outpatient Prescriptions on File Prior to Visit  Medication Sig Dispense Refill  . acyclovir (ZOVIRAX) 400 MG tablet Take 400 mg by mouth as needed.       Marland Kitchen albuterol (VENTOLIN HFA) 108 (90 BASE) MCG/ACT inhaler Inhale 2 puffs into the lungs every 6 (six) hours as needed.  1 Inhaler  3  . Amlodipine-Valsartan-HCTZ (EXFORGE HCT) 10-160-12.5 MG TABS Take 1 tablet by mouth daily.       . RABEprazole (ACIPHEX) 20 MG tablet Take 20 mg by mouth daily.       . traMADol (ULTRAM) 50 MG tablet 1-2 every 4 hours as needed for cough or pain  40 tablet  0  . Triamcinolone Acetonide (TRIAMCINOLONE 0.1 % CREAM : EUCERIN) CREA Apply 1 application topically 2 (two) times daily.  60 each  1  . levofloxacin (LEVAQUIN) 750 MG tablet Take 1 tablet (750 mg total) by mouth daily. One daily stop if develop aching in joints/ muscles  5 tablet  0   No current facility-administered medications on file prior to visit.    BP 162/94  Pulse 88  Wt 209 lb (94.802 kg)  BMI 34.24 kg/m2  SpO2 97%chart    Objective:   Physical Exam  Constitutional: She is oriented to person, place, and time. She appears well-developed and well-nourished.  HENT:  Head:  Normocephalic and atraumatic.  Right Ear: External ear normal.  Left Ear: External ear normal.  Nose: Nose normal.  Mouth/Throat: Oropharynx is clear and moist.  Eyes: Conjunctivae are normal. Pupils are equal, round, and reactive to light.  Neck: Normal range of motion. Neck supple. No thyromegaly present.  Cardiovascular: Normal rate, regular rhythm and normal heart sounds.   No murmur heard. Pulmonary/Chest: Effort normal and breath sounds normal.  Abdominal: Soft. Bowel sounds are normal.  Musculoskeletal: Normal range of motion.  Neurological: She is alert and oriented to person, place, and time. She has normal reflexes. No cranial nerve deficit.  Skin: Skin is warm and dry.  Psychiatric: She has a normal mood and affect.           Assessment & Plan:  1. Complete Physical Exam  2. Hypertension 3. Obesity  Referral made to GI for colonoscopy. Lipid panel and EKG ordered today. Pt instructed to follow up with PCP in 6 months or sooner with any acute issues, questions or concerns.  Note by Davonna Belling, FNP Student

## 2013-08-06 ENCOUNTER — Encounter: Payer: Self-pay | Admitting: Family

## 2013-08-10 ENCOUNTER — Emergency Department (HOSPITAL_COMMUNITY)
Admission: EM | Admit: 2013-08-10 | Discharge: 2013-08-10 | Disposition: A | Discharge status: Home / Self Care | Payer: BC Managed Care – PPO | Attending: Emergency Medicine | Admitting: Emergency Medicine

## 2013-08-10 ENCOUNTER — Encounter (HOSPITAL_COMMUNITY): Payer: Self-pay | Admitting: Emergency Medicine

## 2013-08-10 DIAGNOSIS — I10 Essential (primary) hypertension: Secondary | ICD-10-CM | POA: Insufficient documentation

## 2013-08-10 DIAGNOSIS — K219 Gastro-esophageal reflux disease without esophagitis: Secondary | ICD-10-CM | POA: Insufficient documentation

## 2013-08-10 DIAGNOSIS — R51 Headache: Secondary | ICD-10-CM | POA: Insufficient documentation

## 2013-08-10 DIAGNOSIS — R040 Epistaxis: Secondary | ICD-10-CM | POA: Insufficient documentation

## 2013-08-10 DIAGNOSIS — Z880 Allergy status to penicillin: Secondary | ICD-10-CM | POA: Insufficient documentation

## 2013-08-10 DIAGNOSIS — E669 Obesity, unspecified: Secondary | ICD-10-CM | POA: Insufficient documentation

## 2013-08-10 DIAGNOSIS — Z872 Personal history of diseases of the skin and subcutaneous tissue: Secondary | ICD-10-CM | POA: Insufficient documentation

## 2013-08-10 DIAGNOSIS — Z85048 Personal history of other malignant neoplasm of rectum, rectosigmoid junction, and anus: Secondary | ICD-10-CM | POA: Insufficient documentation

## 2013-08-10 DIAGNOSIS — Z87891 Personal history of nicotine dependence: Secondary | ICD-10-CM | POA: Insufficient documentation

## 2013-08-10 DIAGNOSIS — Z79899 Other long term (current) drug therapy: Secondary | ICD-10-CM | POA: Insufficient documentation

## 2013-08-10 NOTE — ED Provider Notes (Signed)
This chart was scribed for Wynetta Emery PA-C, a non-physician practitioner working with Glynn Octave, MD by Lewanda Rife, ED Scribe. This patient was seen in room TR04C/TR04C and the patient's care was started at 2108.    CSN: [id number]     Arrival date & time 08/10/13  2012 History   First MD Initiated Contact with Patient 08/10/13 2034     Chief Complaint  Patient presents with  . Epistaxis   (Consider location/radiation/quality/duration/timing/severity/associated sxs/prior Treatment) The history is provided by the patient.   HPI Comments: [name] is a 57 y.o. female who presents to the Emergency Department complaining of resolved epistaxis onset 5 pm this afternoon. Reports having 1 epistaxis episode lasting 20 min. Reports associated headache. Denies associated injury, chest pain, and shortness of breath. Reports she was able to control bleeding by holding pressure. Reports calling PCP Dr. Orvan Falconer who recommended to be evaluated in ED.  Past Medical History  Diagnosis Date  . Hypertension   . Pulmonary nodule, left   . Obesity   . Sarcoidosis   . GERD (gastroesophageal reflux disease)   . Rectal cancer    History reviewed. No pertinent past surgical history. Family History  Problem Relation Age of Onset  . Allergies Mother   . Allergies Sister   . Allergies Brother   . Asthma Mother   . Sarcoidosis Neg Hx    History  Substance Use Topics  . Smoking status: Former Smoker -- 0.50 packs/day for 1 years    Types: Cigarettes    Quit date: 12/16/1993  . Smokeless tobacco: Never Used     Comment: smoked breifly while in college  . Alcohol Use: No   OB History   Grav Para Term Preterm Abortions TAB SAB Ect Mult Living                 Review of Systems A complete 10 system review of systems was obtained and all systems are negative except as noted in the HPI and PMH.    Allergies  Penicillins  Home Medications   Current Outpatient Rx  Name   Route  Sig  Dispense  Refill  . Amlodipine-Valsartan-HCTZ (EXFORGE HCT) 10-160-12.5 MG TABS   Oral   Take 1 tablet by mouth daily.          . Multiple Vitamins-Minerals (MULTIVITAMIN PO)   Oral   Take 1 tablet by mouth daily.         . RABEprazole (ACIPHEX) 20 MG tablet   Oral   Take 20 mg by mouth daily.          . Triamcinolone Acetonide (TRIAMCINOLONE 0.1 % CREAM : EUCERIN) CREA   Topical   Apply 1 application topically 2 (two) times daily.   60 each   1     1:1 mix    BP 178/88  Pulse 80  Temp(Src) 98.1 F (36.7 C) (Oral)  Resp 16  SpO2 96% Physical Exam  Nursing note and vitals reviewed. Constitutional: She is oriented to person, place, and time. She appears well-developed and well-nourished. No distress.  HENT:  Head: Normocephalic.  No active bleeding, no clots identified.  Eyes: Conjunctivae and EOM are normal.  Cardiovascular: Normal rate.   Pulmonary/Chest: Effort normal. No stridor.  Musculoskeletal: Normal range of motion.  Neurological: She is alert and oriented to person, place, and time.  Psychiatric: She has a normal mood and affect.    ED Course  Procedures (including critical care time) Medications -  No data to display  Labs Review Labs Reviewed - No data to display Imaging Review No results found.  MDM   1. Nosebleed      Filed Vitals:   08/10/13 2016 08/10/13 2145  BP: 178/88 163/89  Pulse: 80 78  Temp: 98.1 F (36.7 C)   TempSrc: Oral   Resp: 16 16  SpO2: 96% 99%     [name] is a 57 y.o. female with resolved epistaxis.   Pt is hemodynamically stable, appropriate for, and amenable to discharge at this time. Pt verbalized understanding and agrees with care plan. All questions answered. Outpatient follow-up and specific return precautions discussed.    I personally performed the services described in this documentation, which was scribed in my presence. The recorded information has been reviewed and is  accurate.  Note: Portions of this report may have been transcribed using voice recognition software. Every effort was made to ensure accuracy; however, inadvertent computerized transcription errors may be present    Wynetta Emery, PA-C 08/12/13 0057

## 2013-08-10 NOTE — ED Notes (Addendum)
PT. REPORTS EPISTAXIS AT HOME THIS AFTERNOON , DENIES INJURY , PT. STATED ELEVATED BLOOD PRESSURE AT HOME THIS EVENING =- 179/103 , SLIGHT HEADACHE . NO NASAL BLEEDING AT TRIAGE . PT. STATED SHE FORGOT TO TAKE HER ANTIHYPERTENSIVE MEDICATION THIS MORNING .

## 2013-08-11 ENCOUNTER — Telehealth: Payer: Self-pay | Admitting: Family

## 2013-08-11 NOTE — Telephone Encounter (Signed)
Schedule ED f/u for this week or early next week

## 2013-08-11 NOTE — Telephone Encounter (Signed)
PT went to the hospital last night due to high BP. It was 183/96 at the hospital. It was 186/104 at 6:40 this morning. It's 145/91 right now. She states that they did not alter her medication. When should she be seen? Thank you!

## 2013-08-11 NOTE — Telephone Encounter (Signed)
[facility] Triage Call Report Triage Record Num: [id number] Operator: Karenann Cai Patient Name: [name] Call Date & Time: 08/10/2013 7:10:26PM Patient Phone: [phone] PCP: Adline Mango Patient Gender: Female PCP Fax : Patient DOB: 05-05-1956 [facility] Reason for Call: Caller: [name]/Patient; PCP: Adline Mango (Family Practice); CB#: [phone]; reason for call: BP 179/103 and experienced a nose bleed. Onset: 17:00. Nosebleed lasted for 20 minutes. Patient's BP remains elevated. Afebrile. LMP: none. Patient did not take BP medication this morning: took this medication after 17:00 (08/10/2013). Disposition: see Provider immediately due to answering yes to nosebleed not controlled after 2 attmpets of constant direct pressure: patient reported nose bleed lasted 20 minutes. RN advised patient to have another adult drive her to area UC ([facility] to be evaluated. Patient agreed. Patient advised to follow up with PCP tomorrow (08/12/2013). Protocol(s) Used: Hypertension, Diagnosed or Suspected Recommended Outcome per Protocol: See ED Immediately Override Outcome if Used in Protocol: See Provider Immediately RN Reason for Override Outcome: Nursing Judgement Used. Reason for Outcome: Nosebleed not controlled after 2 attempts of constant direct pressure for a full 10 minutes by a clock (each attempt) Care Advice: ~

## 2013-08-12 ENCOUNTER — Encounter: Payer: Self-pay | Admitting: Family

## 2013-08-12 ENCOUNTER — Ambulatory Visit (INDEPENDENT_AMBULATORY_CARE_PROVIDER_SITE_OTHER): Payer: BC Managed Care – PPO | Admitting: Family

## 2013-08-12 VITALS — BP 140/92 | HR 84 | Wt 210.0 lb

## 2013-08-12 DIAGNOSIS — J339 Nasal polyp, unspecified: Secondary | ICD-10-CM

## 2013-08-12 DIAGNOSIS — R040 Epistaxis: Secondary | ICD-10-CM

## 2013-08-12 DIAGNOSIS — I10 Essential (primary) hypertension: Secondary | ICD-10-CM

## 2013-08-12 MED ORDER — FLUTICASONE PROPIONATE 50 MCG/ACT NA SUSP: 2.0000 | Freq: Every day | NASAL | Status: DC

## 2013-08-12 NOTE — Progress Notes (Signed)
  Subjective:    Patient ID: [name], female    DOB: 1956/01/07, 57 y.o.   MRN: [id number]  HPI 57 year old African American female, nonsmoker is in his emergency department followup. She was seen in the ED on 08/10/2013 but elevated blood pressure and nosebleed. Patient had not taken her blood pressure medication. She had held pressure to her nose 20 minutes and her nose continues to bleed. Has not had any more issues with nosebleeds. But, hasn't taken her blood pressure medication daily. 2 lightheadedness, dizziness, chest pain, palpitations, shortness of breath or edema.     Review of Systems  Constitutional: Negative.   HENT: Negative.   Respiratory: Negative.   Cardiovascular: Negative.   Gastrointestinal: Negative.   Musculoskeletal: Negative.   Skin: Negative.   Allergic/Immunologic: Negative.   Neurological: Negative.   Hematological: Negative for adenopathy.       Epistasis-resolved  Psychiatric/Behavioral: Negative.        Objective:   Physical Exam  Constitutional: She is oriented to person, place, and time. She appears well-developed and well-nourished.  HENT:  Right Ear: External ear normal.  Left Ear: External ear normal.  Nose: Nose normal.  Mouth/Throat: Oropharynx is clear and moist.  Nasal polyp of the left turbinate.  Neck: Normal range of motion. Neck supple.  Cardiovascular: Normal rate, regular rhythm and normal heart sounds.   Pulmonary/Chest: Effort normal and breath sounds normal.  Musculoskeletal: Normal range of motion.  Neurological: She is alert and oriented to person, place, and time. She has normal reflexes.  Skin: Skin is warm and dry.  Psychiatric: She has a normal mood and affect.          Assessment & Plan: 1. Hypertension 2. Epistaxis    Assessment:  1. Hypertension 2. Epistaxis 3. Nasal polyp  Plan: Continue blood pressure medication. Keep an extra supply of blood pressure medication that works if she does not forget  it. Take blood pressure with a larger cuff. Call with blood pressure readings in one week. Flonase 2 sprays in each nostril once a day for congestion and nasal polyp.

## 2013-08-12 NOTE — ED Provider Notes (Signed)
Medical screening examination/treatment/procedure(s) were performed by non-physician practitioner and as supervising physician I was immediately available for consultation/collaboration.   Glynn Octave, MD 08/12/13 1220

## 2013-08-12 NOTE — Patient Instructions (Addendum)
Call with blood pressure readings in 1 week.

## 2013-09-30 ENCOUNTER — Other Ambulatory Visit: Payer: Self-pay | Admitting: Obstetrics and Gynecology

## 2014-01-02 ENCOUNTER — Other Ambulatory Visit: Payer: Self-pay | Admitting: Internal Medicine

## 2014-01-03 ENCOUNTER — Telehealth: Payer: Self-pay | Admitting: Family

## 2014-01-03 ENCOUNTER — Ambulatory Visit (INDEPENDENT_AMBULATORY_CARE_PROVIDER_SITE_OTHER)
Admission: RE | Admit: 2014-01-03 | Discharge: 2014-01-03 | Disposition: A | Discharge status: Home / Self Care | Payer: BC Managed Care – PPO | Source: Ambulatory Visit | Attending: Internal Medicine | Admitting: Internal Medicine

## 2014-01-03 ENCOUNTER — Ambulatory Visit (INDEPENDENT_AMBULATORY_CARE_PROVIDER_SITE_OTHER): Payer: BC Managed Care – PPO | Admitting: Internal Medicine

## 2014-01-03 ENCOUNTER — Encounter: Payer: Self-pay | Admitting: Internal Medicine

## 2014-01-03 VITALS — BP 160/90 | HR 92 | Temp 99.2°F | Wt 206.0 lb

## 2014-01-03 DIAGNOSIS — Q357 Cleft uvula: Secondary | ICD-10-CM | POA: Insufficient documentation

## 2014-01-03 DIAGNOSIS — R059 Cough, unspecified: Secondary | ICD-10-CM

## 2014-01-03 DIAGNOSIS — R062 Wheezing: Secondary | ICD-10-CM

## 2014-01-03 DIAGNOSIS — R509 Fever, unspecified: Secondary | ICD-10-CM

## 2014-01-03 DIAGNOSIS — R05 Cough: Secondary | ICD-10-CM

## 2014-01-03 DIAGNOSIS — D869 Sarcoidosis, unspecified: Secondary | ICD-10-CM

## 2014-01-03 DIAGNOSIS — R6889 Other general symptoms and signs: Secondary | ICD-10-CM

## 2014-01-03 MED ORDER — OSELTAMIVIR PHOSPHATE 75 MG PO CAPS: 75.0000 mg | ORAL_CAPSULE | Freq: Two times a day (BID) | ORAL | Status: DC

## 2014-01-03 MED ORDER — ALBUTEROL SULFATE (2.5 MG/3ML) 0.083% IN NEBU
2.5000 mg | INHALATION_SOLUTION | RESPIRATORY_TRACT | Status: AC
  Administered 2014-01-03: 2.5 mg via RESPIRATORY_TRACT

## 2014-01-03 NOTE — Patient Instructions (Signed)
This acts like flu  tamiflu may  Decrease length of illness if used early. Get chest x ray to  Check for pneumonia  That would be treated differently. Inhaler as needed for wheezing  expect  Fever chills to be gone in 2 days or thereabouts . Fu with medical team if worsening Cough medications are for comfort and don't change the course of the illness.

## 2014-01-03 NOTE — Progress Notes (Signed)
Pre visit review using our clinic review tool, if applicable. No additional management support is needed unless otherwise documented below in the visit note. 

## 2014-01-03 NOTE — Telephone Encounter (Signed)
FYI!  Pt coming to see you today. 

## 2014-01-03 NOTE — Telephone Encounter (Signed)
Patient Information:  Caller Name: Keigan  Phone: [phone]  Patient: [name]  Gender: Female  DOB: 10-12-1956  Age: 58 Years  PCP: [facility])  Pregnant: No  Office Follow Up:  Does the office need to follow up with this patient?: No  Instructions For The Office: N/A  RN Note:  Mild wheezing noted by triager over phone. Appts full with [facility], scheduled with next available 1500 Dr. Regis Bill.  Symptoms  Reason For Call & Symptoms: Cough, slightly productive; throat is a little sore, a little dizzy, left ear hurting, loose stool x 3 this am.  Reviewed Health History In EMR: Yes  Reviewed Medications In EMR: Yes  Reviewed Allergies In EMR: Yes  Reviewed Surgeries / Procedures: Yes  Date of Onset of Symptoms: 01/01/2014  Treatments Tried: Delsym  Treatments Tried Worked: Yes OB / GYN:  LMP: Unknown  Guideline(s) Used:  Cough  Disposition Per Guideline:   Go to Office Now  Reason For Disposition Reached:   Wheezing is present  Advice Given:  Reassurance  Coughing is the way that our lungs remove irritants and mucus. It helps protect our lungs from getting pneumonia.  Cough Medicines:  OTC Cough Syrups: The most common cough suppressant in OTC cough medications is dextromethorphan. Often the letters "DM" appear in the name.  OTC Cough Drops: Cough drops can help a lot, especially for mild coughs. They reduce coughing by soothing your irritated throat and removing that tickle sensation in the back of the throat. Cough drops also have the advantage of portability - you can carry them with you.  Home Remedy - Hard Candy: Hard candy works just as well as medicine-flavored OTC cough drops. Diabetics should use sugar-free candy.  Home Remedy - Honey: This old home remedy has been shown to help decrease coughing at night. The adult dosage is 2 teaspoons (10 ml) at bedtime. Honey should not be given to infants under one year of age.  OTC  Cough Syrup - Dextromethorphan:  Cough syrups containing the cough suppressant dextromethorphan (DM) may help decrease your cough. Cough syrups work best for coughs that keep you awake at night. They can also sometimes help in the late stages of a respiratory infection when the cough is dry and hacking. They can be used along with cough drops.  Fever Medicines:  For fevers above 101 F (38.3 C) take either acetaminophen or ibuprofen.  Acetaminophen (e.g., Tylenol):  Regular Strength Tylenol: Take 650 mg (two 325 mg pills) by mouth every 4-6 hours as needed. Each Regular Strength Tylenol pill has 325 mg of acetaminophen.  Extra Strength Tylenol: Take 1,000 mg (two 500 mg pills) every 8 hours as needed. Each Extra Strength Tylenol pill has 500 mg of acetaminophen.  Call Back If:  Difficulty breathing  Cough lasts more than 3 weeks  Fever lasts > 3 days  You become worse.  Patient Will Follow Care Advice:  YES  Appointment Scheduled:  01/03/2014 15:00:00 Appointment Scheduled Provider:  Shanon Ace [facility])

## 2014-01-03 NOTE — Progress Notes (Signed)
Chief Complaint  Patient presents with  . Fatigue  . Generalized Body Aches  . Cough    HPI: Patient comes in today for SDA for  new problem evaluation. Sent in by [facility] because pcp appt full   Acute onset 36 hours ago of sx  Saturday had chills  Lay down myalgias and tired . Diarrhea  coughing wheezing.  Took delsym and mucinex.  No fever med.  Coughing  Chills . No recent inhaler use. Had ventolin hfa  Is followed by pulmonary dr wert for sarcoid anand cough  On a prn basis   And reflux  medication ROS: See pertinent positives and negatives per HPI. No vomiting hemoptysis  Daughter  54 is well.  No flu vaccine .  " Makes her sick "   Past Medical History  Diagnosis Date  . Hypertension   . Pulmonary nodule, left   . Obesity   . Sarcoidosis   . GERD (gastroesophageal reflux disease)   . Rectal cancer   . Cleft lip     repaired   . Bifid uvula     Family History  Problem Relation Age of Onset  . Allergies Mother   . Allergies Sister   . Allergies Brother   . Asthma Mother   . Sarcoidosis Neg Hx     History   Social History  . Marital Status: Single    Spouse Name: N/A    Number of Children: N/A  . Years of Education: N/A   Social History Main Topics  . Smoking status: Former Smoker -- 0.50 packs/day for 1 years    Types: Cigarettes    Quit date: 12/16/1993  . Smokeless tobacco: Never Used     Comment: smoked breifly while in college  . Alcohol Use: No  . Drug Use: No  . Sexual Activity: None   Other Topics Concern  . None   Social History Narrative  . None    Outpatient Encounter Prescriptions as of 01/03/2014  Medication Sig  . ACIPHEX 20 MG tablet take 1 tablet by mouth once daily TAKE 30 MINUTES BEFORE EATING  . Amlodipine-Valsartan-HCTZ (EXFORGE HCT) 10-160-12.5 MG TABS Take 1 tablet by mouth daily.   . fluticasone (FLONASE) 50 MCG/ACT nasal spray Place 2 sprays into the nose daily.  . Multiple Vitamins-Minerals (MULTIVITAMIN PO) Take 1 tablet  by mouth daily.  . Triamcinolone Acetonide (TRIAMCINOLONE 0.1 % CREAM : EUCERIN) CREA Apply 1 application topically 2 (two) times daily.  Marland Kitchen oseltamivir (TAMIFLU) 75 MG capsule Take 1 capsule (75 mg total) by mouth 2 (two) times daily.  . [DISCONTINUED] RABEprazole (ACIPHEX) 20 MG tablet Take 20 mg by mouth daily.   . [EXPIRED] albuterol (PROVENTIL) (2.5 MG/3ML) 0.083% nebulizer solution 2.5 mg     EXAM:  BP 160/90  Pulse 92  Temp(Src) 99.2 F (37.3 C) (Oral)  Wt 206 lb (93.441 kg)  SpO2 93%  Body mass index is 33.75 kg/(m^2).  WDWN in NAD  quiet respirations; mildly congested  somewhat hoarse. Non toxic .moderately ill  HEENT: Normocephalic ;atraumatic  uper lip repair , Eyes;  PERRL, EOMs  Full, lids and conjunctiva clear,,Ears: no deformities, canals nl, TM landmarks normal, Nose: noor discharge but congested;face non tender Mouth : OP  No edema bifid uvula  Neck: Supple without adenopathy or masses or bruits Chest:   Dec bs ocass wheeze  After neb  Improved air flow and more musical wheeze  CV:  S1-S2 no gallops or murmurs peripheral perfusion is  normal Skin :nl perfusion and no acute rashes  MS: moves all extremities without noticeable focal  abnormality  PSYCH: pleasant and cooperative, no obvious depression or anxiety  ASSESSMENT AND PLAN:  Discussed the following assessment and plan:  Flu-like symptoms - Plan: DG Chest 2 View, albuterol (PROVENTIL) (2.5 MG/3ML) 0.083% nebulizer solution 2.5 mg  Fever and chills - Plan: DG Chest 2 View, albuterol (PROVENTIL) (2.5 MG/3ML) 0.083% nebulizer solution 2.5 mg  Wheezing - Plan: DG Chest 2 View, albuterol (PROVENTIL) (2.5 MG/3ML) 0.083% nebulizer solution 2.5 mg  Sarcoidosis - hx stable  recently  - Plan: DG Chest 2 View, albuterol (PROVENTIL) (2.5 MG/3ML) 0.083% nebulizer solution 2.5 mg  Cough  -Patient advised to return or notify health care team  if symptoms worsen or persist or new concerns arise.  Patient Instructions   This acts like flu  tamiflu may  Decrease length of illness if used early. Get chest x ray to  Check for pneumonia  That would be treated differently. Inhaler as needed for wheezing  expect  Fever chills to be gone in 2 days or thereabouts . Fu with medical team if worsening Cough medications are for comfort and don't change the course of the illness.    Standley Brooking. Severo Beber M.D.

## 2014-01-07 ENCOUNTER — Ambulatory Visit: Payer: BC Managed Care – PPO | Admitting: Family

## 2014-01-10 ENCOUNTER — Telehealth: Payer: Self-pay | Admitting: Family

## 2014-01-10 ENCOUNTER — Encounter: Payer: Self-pay | Admitting: Family Medicine

## 2014-01-10 ENCOUNTER — Ambulatory Visit (INDEPENDENT_AMBULATORY_CARE_PROVIDER_SITE_OTHER): Payer: BC Managed Care – PPO | Admitting: Family Medicine

## 2014-01-10 VITALS — BP 164/102 | HR 73 | Temp 98.8°F | Wt 208.0 lb

## 2014-01-10 DIAGNOSIS — R05 Cough: Secondary | ICD-10-CM

## 2014-01-10 DIAGNOSIS — R059 Cough, unspecified: Secondary | ICD-10-CM

## 2014-01-10 DIAGNOSIS — J069 Acute upper respiratory infection, unspecified: Secondary | ICD-10-CM

## 2014-01-10 MED ORDER — HYDROCOD POLST-CHLORPHEN POLST 10-8 MG/5ML PO LQCR: 5.0000 mL | Freq: Every evening | ORAL | Status: DC | PRN

## 2014-01-10 NOTE — Telephone Encounter (Signed)
Patient coming to see [place] today.

## 2014-01-10 NOTE — Progress Notes (Signed)
Pre visit review using our clinic review tool, if applicable. No additional management support is needed unless otherwise documented below in the visit note. 

## 2014-01-10 NOTE — Patient Instructions (Signed)
INSTRUCTIONS FOR UPPER RESPIRATORY INFECTION:  -plenty of rest and fluids  -nasal saline wash 2-3 times daily (use prepackaged nasal saline or bottled/distilled water if making your own)   -can use sinex or afrin nasal spray for drainage and nasal congestion - but do NOT use longer then 3-4 days  -can use tylenol or ibuprofen as directed for aches and sorethroat if no liver or kidney disease  -in the winter time, using a humidifier at night is helpful (please follow cleaning instructions)  -if you are taking a cough medication - use only as directed, may also try a teaspoon of honey to coat the throat and throat lozenges  -for sore throat, salt water gargles can help  -follow up if you have fevers, facial pain, tooth pain, difficulty breathing or are worsening or not getting better in 5-7 days

## 2014-01-10 NOTE — Progress Notes (Signed)
Chief Complaint  Patient presents with  . Fever    comes and goes, cough, congestion    HPI:  -started: 1 week ago, seen and treated with tamiflu and albuterol for presumed flu on 1/16 (no flu shot done), CXR from 1/20 with no PNA, ? bronchitis -reports: feeling a little better but persistent cough, clear nasal congestion and PND, no fever over 100 - had a low grade temp of 99, has used inhaler for cough -denies:fever over 100, SOB, NVD, tooth pain, sinus pain, vomiting -has tried: musinex -sick contacts/travel/risks: denies flu exposure, tick exposure or or Ebola risks -Hx of: allergies ROS: See pertinent positives and negatives per HPI.  Past Medical History  Diagnosis Date  . Hypertension   . Pulmonary nodule, left   . Obesity   . Sarcoidosis   . GERD (gastroesophageal reflux disease)   . Rectal cancer   . Cleft lip     repaired   . Bifid uvula     No past surgical history on file.  Family History  Problem Relation Age of Onset  . Allergies Mother   . Allergies Sister   . Allergies Brother   . Asthma Mother   . Sarcoidosis Neg Hx     History   Social History  . Marital Status: Single    Spouse Name: N/A    Number of Children: N/A  . Years of Education: N/A   Social History Main Topics  . Smoking status: Former Smoker -- 0.50 packs/day for 1 years    Types: Cigarettes    Quit date: 12/16/1993  . Smokeless tobacco: Never Used     Comment: smoked breifly while in college  . Alcohol Use: No  . Drug Use: No  . Sexual Activity: None   Other Topics Concern  . None   Social History Narrative  . None    Current outpatient prescriptions:ACIPHEX 20 MG tablet, take 1 tablet by mouth once daily TAKE 30 MINUTES BEFORE EATING, Disp: 34 tablet, Rfl: 0;  Amlodipine-Valsartan-HCTZ (EXFORGE HCT) 10-160-12.5 MG TABS, Take 1 tablet by mouth daily. , Disp: , Rfl: ;  fluticasone (FLONASE) 50 MCG/ACT nasal spray, Place 2 sprays into the nose daily., Disp: 16 g, Rfl: 6;   Multiple Vitamins-Minerals (MULTIVITAMIN PO), Take 1 tablet by mouth daily., Disp: , Rfl:  Triamcinolone Acetonide (TRIAMCINOLONE 0.1 % CREAM : EUCERIN) CREA, Apply 1 application topically 2 (two) times daily., Disp: 60 each, Rfl: 1;  chlorpheniramine-HYDROcodone (TUSSIONEX PENNKINETIC ER) 10-8 MG/5ML LQCR, Take 5 mLs by mouth at bedtime as needed for cough., Disp: 115 mL, Rfl: 0  EXAM:  Filed Vitals:   01/10/14 1041  BP: 164/102  Pulse: 73  Temp: 98.8 F (37.1 C)    Body mass index is 34.07 kg/(m^2).  GENERAL: vitals reviewed and listed above, alert, oriented, appears well hydrated and in no acute distress  HEENT: atraumatic, conjunttiva clear, no obvious abnormalities on inspection of external nose and ears, normal appearance of ear canals and TMs, clear nasal congestion, mild post oropharyngeal erythema with PND, no tonsillar edema or exudate, no sinus TTP  NECK: no obvious masses on inspection  LUNGS: clear to auscultation bilaterally, no wheezes, rales or rhonchi, good air movement  CV: HRRR, no peripheral edema  MS: moves all extremities without noticeable abnormality  PSYCH: pleasant and cooperative, no obvious depression or anxiety  ASSESSMENT AND PLAN:  Discussed the following assessment and plan:  Cough - Plan: chlorpheniramine-HYDROcodone (TUSSIONEX PENNKINETIC ER) 10-8 MG/5ML LQCR  Upper respiratory  infection - Plan: chlorpheniramine-HYDROcodone ([place] ER) 10-8 MG/5ML LQCR  -given HPI and exam findings today, a serious infection or illness is unlikely. We discussed potential etiologies, with VURI being most likely, and advised supportive care and monitoring. We discussed treatment side effects, likely course, antibiotic misuse, transmission, and signs of developing a serious illness. -reviewed xray -pt wants cough medicaiton, discussed risks -not suggestive of bacterial illness - discussed risks/return precautions -of course, we advised to return  or notify a doctor immediately if symptoms worsen or persist or new concerns arise.    There are no Patient Instructions on file for this visit.   Colin Benton R.

## 2014-01-10 NOTE — Telephone Encounter (Signed)
Patient Information:  Caller Name: Inaya  Phone: [phone]  Patient: [name]  Gender: Female  DOB: Nov 02, 1956  Age: 58 Years  PCP: Roxy Cedar [facility])  Pregnant: No  Office Follow Up:  Does the office need to follow up with this patient?: No  Instructions For The Office: N/A   Symptoms  Reason For Call & Symptoms: Patient calling about cough.  She was seen on 01/03/14 and diagnosed with Influenza.  Frequent cough and hoarseness noted during the call.  Emergent symptoms ruled out.  See Today in Office per Influenza Follow-Up Call due to Fever returns after gone for over 24 hours and symptoms worse or not improved. Scheduled for first available appointment per caller request.    Reviewed Health History In EMR: Yes  Reviewed Medications In EMR: Yes  Reviewed Allergies In EMR: Yes  Reviewed Surgeries / Procedures: Yes  Date of Onset of Symptoms: 01/01/2014  Treatments Tried: Tamiflu  Treatments Tried Worked: No  Any Fever: Yes  Fever Taken: Oral  Fever Time Of Reading: 21:30:00  Fever Last Reading: 99.2 OB / GYN:  LMP: Unknown  Guideline(s) Used:  Influenza Follow-Up Call  Disposition Per Guideline:   See Today in Office  Reason For Disposition Reached:   Fever returns after gone for over 24 hours and symptoms worse or not improved  Advice Given:  Influenza - Isolation Is Needed Until After Fever Is Gone:  If you have flu-like symptoms, please stay at home until at least 24 hours after you are free of fever.  Do Not go to work or school.  Do Not go to church, child care centers, shopping, or other public places.  Do Not shake hands.  Do Not share eating utensils (e.g., spoon, fork)  Avoid close contact with others (hugging, kissing).  Influenza - Treating the Symptoms:  Cough: Use cough drops.  Feeling dehydrated: Drink extra liquids. If the air in your home is dry, use a humidifier.  Fever: For fever over 101 F (38.3 C), take acetaminophen  every 4-6 hours (Adults 650 mg) OR ibuprofen every 6-8 hours (Adults 400-600 mg).  Sore throat: Try throat lozenges, hard candy or warm chicken broth.  Call Back If  Difficulty breathing  Fever lasts more than 3 days  Runny nose lasts more than 14 days  Cough lasts more than 3 weeks  You become worse.  Patient Will Follow Care Advice:  YES  Appointment Scheduled:  01/10/2014 11:00:00 Appointment Scheduled Provider:  Maudie Mercury (TEXT 1st, after 20 mins can call), [facility])

## 2014-01-24 ENCOUNTER — Ambulatory Visit (INDEPENDENT_AMBULATORY_CARE_PROVIDER_SITE_OTHER): Payer: BC Managed Care – PPO | Admitting: Family

## 2014-01-24 ENCOUNTER — Encounter: Payer: Self-pay | Admitting: Family

## 2014-01-24 VITALS — BP 178/100 | HR 88 | Temp 98.0°F | Wt 213.0 lb

## 2014-01-24 DIAGNOSIS — D869 Sarcoidosis, unspecified: Secondary | ICD-10-CM

## 2014-01-24 DIAGNOSIS — I10 Essential (primary) hypertension: Secondary | ICD-10-CM

## 2014-01-24 DIAGNOSIS — R05 Cough: Secondary | ICD-10-CM

## 2014-01-24 DIAGNOSIS — R059 Cough, unspecified: Secondary | ICD-10-CM

## 2014-01-24 MED ORDER — AMLODIPINE-VALSARTAN-HCTZ 10-320-25 MG PO TABS: 1.0000 | ORAL_TABLET | Freq: Every day | ORAL | Status: DC

## 2014-01-24 MED ORDER — PREDNISONE 20 MG PO TABS: ORAL_TABLET | ORAL | Status: AC

## 2014-01-24 MED ORDER — ALBUTEROL SULFATE HFA 108 (90 BASE) MCG/ACT IN AERS: 2.0000 | INHALATION_SPRAY | Freq: Four times a day (QID) | RESPIRATORY_TRACT | Status: DC | PRN

## 2014-01-24 NOTE — Progress Notes (Signed)
Pre visit review using our clinic review tool, if applicable. No additional management support is needed unless otherwise documented below in the visit note. 

## 2014-01-24 NOTE — Patient Instructions (Signed)
Sodium-Controlled Diet Sodium is a mineral. It is found in many foods. Sodium may be found naturally or added during the making of a food. The most common form of sodium is salt, which is made up of sodium and chloride. Reducing your sodium intake involves changing your eating habits. The following guidelines will help you reduce the sodium in your diet:  Stop using the salt shaker.  Use salt sparingly in cooking and baking.  Substitute with sodium-free seasonings and spices.  Do not use a salt substitute (potassium chloride) without your caregiver's permission.  Include a variety of fresh, unprocessed foods in your diet.  Limit the use of processed and convenience foods that are high in sodium. USE THE FOLLOWING FOODS SPARINGLY: Breads/Starches  Commercial bread stuffing, commercial pancake or waffle mixes, coating mixes. Waffles. Croutons. Prepared (boxed or frozen) potato, rice, or noodle mixes that contain salt or sodium. Salted French fries or hash browns. Salted popcorn, breads, crackers, chips, or snack foods. Vegetables  Vegetables canned with salt or prepared in cream, butter, or cheese sauces. Sauerkraut. Tomato or vegetable juices canned with salt.  Fresh vegetables are allowed if rinsed thoroughly. Fruit  Fruit is okay to eat. Meat and Meat Substitutes  Salted or smoked meats, such as bacon or Canadian bacon, chipped or corned beef, hot dogs, salt pork, luncheon meats, pastrami, ham, or sausage. Canned or smoked fish, poultry, or meat. Processed cheese or cheese spreads, blue or Roquefort cheese. Battered or frozen fish products. Prepared spaghetti sauce. Baked beans. Reuben sandwiches. Salted nuts. Caviar. Milk  Limit buttermilk to 1 cup per week. Soups and Combination Foods  Bouillon cubes, canned or dried soups, broth, consomm. Convenience (frozen or packaged) dinners with more than 600 mg sodium. Pot pies, pizza, Asian food, fast food cheeseburgers, and specialty  sandwiches. Desserts and Sweets  Regular (salted) desserts, pie, commercial fruit snack pies, commercial snack cakes, canned puddings.  Eat desserts and sweets in moderation. Fats and Oils  Gravy mixes or canned gravy. No more than 1 to 2 tbs of salad dressing. Chip dips.  Eat fats and oils in moderation. Beverages  See those listed under the vegetables and milk groups. Condiments  Ketchup, mustard, meat sauces, salsa, regular (salted) and lite soy sauce or mustard. Dill pickles, olives, meat tenderizer. Prepared horseradish or pickle relish. Dutch-processed cocoa. Baking powder or baking soda used medicinally. Worcestershire sauce. "Light" salt. Salt substitute, unless approved by your caregiver. Document Released: 05/24/2002 Document Revised: 02/24/2012 Document Reviewed: 12/25/2009 ExitCare Patient Information 2014 [employer].  

## 2014-01-24 NOTE — Progress Notes (Signed)
Subjective:    Patient ID: [name], female    DOB: 1956/03/01, 58 y.o.   MRN: [id number]  HPI  58 year old [name] female, nonsmoker presenting today for follow-up on Hypertension.  She has been taking medication as prescribed.  She has a cough that has been present since January 19th.  She has history of sarcoidosis and has not seen Pulmonary doctor since last year.  She has been  Using albuterol inhaler more than usual.  Cancelled her cruise last month due to feeling bad.   Review of Systems  Constitutional: Negative.   HENT: Positive for congestion.   Respiratory: Positive for cough and wheezing.   Cardiovascular: Negative.   Gastrointestinal: Negative.   Endocrine: Negative.   Musculoskeletal: Negative.   Skin: Negative.   Neurological: Negative.  Negative for dizziness.  Psychiatric/Behavioral: Negative.    Past Medical History  Diagnosis Date  . Hypertension   . Pulmonary nodule, left   . Obesity   . Sarcoidosis   . GERD (gastroesophageal reflux disease)   . Rectal cancer   . Cleft lip     repaired   . Bifid uvula     History   Social History  . Marital Status: Single    Spouse Name: N/A    Number of Children: N/A  . Years of Education: N/A   Occupational History  . Not on file.   Social History Main Topics  . Smoking status: Former Smoker -- 0.50 packs/day for 1 years    Types: Cigarettes    Quit date: 12/16/1993  . Smokeless tobacco: Never Used     Comment: smoked breifly while in college  . Alcohol Use: No  . Drug Use: No  . Sexual Activity: Not on file   Other Topics Concern  . Not on file   Social History Narrative  . No narrative on file    History reviewed. No pertinent past surgical history.  Family History  Problem Relation Age of Onset  . Allergies Mother   . Allergies Sister   . Allergies Brother   . Asthma Mother   . Sarcoidosis Neg Hx     Allergies  Allergen Reactions  . Penicillins Hives    Current Outpatient  Prescriptions on File Prior to Visit  Medication Sig Dispense Refill  . ACIPHEX 20 MG tablet take 1 tablet by mouth once daily TAKE 30 MINUTES BEFORE EATING  34 tablet  0  . fluticasone (FLONASE) 50 MCG/ACT nasal spray Place 2 sprays into the nose daily.  16 g  6  . Multiple Vitamins-Minerals (MULTIVITAMIN PO) Take 1 tablet by mouth daily.      . Triamcinolone Acetonide (TRIAMCINOLONE 0.1 % CREAM : EUCERIN) CREA Apply 1 application topically 2 (two) times daily.  60 each  1  . chlorpheniramine-HYDROcodone (TUSSIONEX PENNKINETIC ER) 10-8 MG/5ML LQCR Take 5 mLs by mouth at bedtime as needed for cough.  115 mL  0   No current facility-administered medications on file prior to visit.    BP 178/100  Pulse 88  Temp(Src) 98 F (36.7 C) (Oral)  Wt 213 lb (96.616 kg)chart    Objective:   Physical Exam  Constitutional: She is oriented to person, place, and time. She appears well-developed and well-nourished.  HENT:  Head: Normocephalic.  Right Ear: External ear normal.  Left Ear: External ear normal.  Cough and nasal congestion  Neck: Normal range of motion. Neck supple.  Cardiovascular: Normal rate, regular rhythm and normal heart sounds.  Pulmonary/Chest: Effort normal. She has wheezes.  Neurological: She is alert and oriented to person, place, and time.  Skin: Skin is warm and dry.          Assessment & Plan:  Assessment 1. Hypertension- increase Exforge HCT 2. Sarcoidosis 3. Wheezing  Plan 1. Increase Exforge HCT to 10-320-25.  2. Prednisone 60mg  x 3 days, 40 mg x 3 days, and 20 mg x 3 days.  3.  Obtain refill for albuterol inhaler. 4. Referral to the Pulmonary for sarcoidosis

## 2014-01-25 ENCOUNTER — Telehealth: Payer: Self-pay | Admitting: Family

## 2014-01-25 NOTE — Telephone Encounter (Signed)
Relevant patient education assigned to patient using Emmi. ° °

## 2014-01-28 ENCOUNTER — Encounter: Payer: Self-pay | Admitting: *Deleted

## 2014-01-28 ENCOUNTER — Ambulatory Visit (INDEPENDENT_AMBULATORY_CARE_PROVIDER_SITE_OTHER): Payer: BC Managed Care – PPO | Admitting: Internal Medicine

## 2014-01-28 ENCOUNTER — Encounter: Payer: Self-pay | Admitting: Internal Medicine

## 2014-01-28 VITALS — BP 160/90 | HR 81 | Temp 97.5°F | Ht 65.5 in | Wt 210.8 lb

## 2014-01-28 DIAGNOSIS — R05 Cough: Secondary | ICD-10-CM

## 2014-01-28 DIAGNOSIS — D869 Sarcoidosis, unspecified: Secondary | ICD-10-CM

## 2014-01-28 DIAGNOSIS — R059 Cough, unspecified: Secondary | ICD-10-CM

## 2014-01-28 MED ORDER — FAMOTIDINE 20 MG PO TABS
ORAL_TABLET | ORAL | Status: DC
Start: 2014-01-28 — End: 2014-02-08

## 2014-01-28 MED ORDER — TRAMADOL HCL 50 MG PO TABS: ORAL_TABLET | ORAL | Status: DC

## 2014-01-28 NOTE — Patient Instructions (Signed)
Aciphex 20 mg Take 30-60 min before first meal of the day also when coughing for any reason take Pepcid 20 mg one at bedtime and chlortrimeton 4 mg at bedtime  Take delsym two tsp every 12 hours and supplement if needed with  tramadol 50 mg up to 2 every 4 hours to suppress the urge to cough. Swallowing water or using ice chips/non mint and menthol containing candies (such as lifesavers or sugarless jolly ranchers) are also effective.  You should rest your voice and avoid activities that you know make you cough.  Once you have eliminated the cough for 3 straight days try reducing the tramadol first,  then the delsym as tolerated.    GERD (REFLUX)  is an extremely common cause of respiratory symptoms, many times with no significant heartburn at all.    It can be treated with medication, but also with lifestyle changes including avoidance of late meals, excessive alcohol, smoking cessation, and avoid fatty foods, chocolate, peppermint, colas, red wine, and acidic juices such as orange juice.  NO MINT OR MENTHOL PRODUCTS SO NO COUGH DROPS  USE SUGARLESS CANDY INSTEAD (jolley ranchers or Stover's)  NO OIL BASED VITAMINS - use powdered substitutes.   If you are satisfied with your treatment plan let your doctor know and he/she can either refill your medications or you can return here when your prescription runs out.     If in any way you are not 100% satisfied,  please tell us.  If 100% better, tell your friends!

## 2014-01-28 NOTE — Progress Notes (Signed)
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  Last chronic prednisone 04/15/08 with no flare cough no nodules (right leg and left hand and right arm)  Last ov 02/17/13 no evidence active sarcoid rec Aciphex 20 mg Take 30-60 min before first meal of the day also when coughing for any reason take Pepcid 20 mg one at bedtime  Take delsym two tsp every 12 hours and supplement if needed with  tramadol 50 mg up to 2 every 4 hours to suppress the urge to cough. Swallowing water or using ice chips/non mint and menthol containing candies (such as lifesavers or sugarless jolly ranchers) are also effective.  You should rest your voice and avoid activities that you know make you cough. Once you have eliminated the cough for 3 straight days try reducing the tramadol first,  then the delsym as tolerated.   GERD diet      01/28/2014  Pulmonary consultation / [name]/ on no maint resp rx / rare baseline need for saba  Chief Complaint  Patient presents with  . Pulmonary Consult    Referred per Dr. Posey [name]. [name] Cough is prod with moderate yellow sputum. She is using proair approx tid.   Onset was Jan 01 2013 while on aciphex with bfast with flu like illness but not follow contingency rx  On prednisone taper/ sp tamiflu/cough meds  Not limited by breathing from desired activities    No obvious day to day or daytime variabilty or assoc  cp or chest tightness,   overt sinus or hb symptoms. No unusual exp hx or h/o childhood pna/  asthma or knowledge of premature birth.  Sleeping ok without nocturnal  or early am exacerbation  of respiratory  c/o's or need for noct saba. Also denies any obvious fluctuation of symptoms with weather or environmental changes or other aggravating or alleviating factors except as outlined above   Current Medications, Allergies, Complete Past Medical History, Past Surgical History, Family History, and Social History were reviewed in [employer] record.       Review of Systems  Constitutional: Negative for fever, chills and unexpected weight change.  HENT: Negative for congestion, dental problem, ear pain, nosebleeds, postnasal drip, rhinorrhea, sinus pressure, sneezing, sore throat, trouble swallowing and voice change.   Eyes: Negative for visual disturbance.  Respiratory: Positive for cough and shortness of breath. Negative for choking.   Cardiovascular: Negative for chest pain and leg swelling.  Gastrointestinal: Negative for vomiting, abdominal pain and diarrhea.  Genitourinary: Negative for difficulty urinating.  Musculoskeletal: Negative for arthralgias.  Skin: Negative for rash.  Neurological: Positive for headaches. Negative for tremors and syncope.  Hematological: Does not bruise/bleed easily.       Objective:   Physical Exam Wt Readings from Last 3 Encounters:  01/28/14 210 lb 12.8 oz (95.618 kg)  01/24/14 213 lb (96.616 kg)  01/10/14 208 lb (94.348 kg)     amb bf belle affect/ nad  HEENT: nl dentition, turbinates, and orophanx. Nl external ear canals without cough  reflex   NECK :  without JVD/Nodes/TM/ nl carotid upstrokes bilaterally   LUNGS: no acc muscle use, clear to A and P bilaterally without cough on insp or exp maneuvers   CV:  RRR  no s3 or murmur or increase in P2, no edema   ABD:  soft and nontender with nl excursion in the supine position. No bruits or organomegaly, bowel sounds nl  MS:  warm without deformities, calf  tenderness, cyanosis or clubbing  SKIN: warm and dry without lesions    NEURO:  alert, approp, no deficits    cxr 01/03/14 No acute infiltrate or pulmonary edema. Stable reticular chronic  interstitial prominence especially in lower lobes. Mild infrahilar  increased bronchial markings suspicious for bronchitic changes.      Assessment & Plan:

## 2014-01-29 NOTE — Assessment & Plan Note (Signed)
Tendency to recurrent cough which becomes severe with relatively mild uri's  Explained natural history of uri and why it's necessary in patients at risk to treat GERD aggressively  at least  short term   to reduce risk of evolving cyclical cough initially  triggered by epithelial injury and a heightened sensitivty to the effects of any upper airway irritants,  most importantly acid - related.  That is, the more sensitive the epithelium damaged for virus, the more the cough, the more the secondary reflux (especially in those prone to reflux) the more the irritation of the sensitive mucosa and so on in a cyclical pattern.   See instructions for specific recommendations which were reviewed directly with the patient who was given a copy with highlighter outlining the key components.    No evidence at all that this is sarcoid related

## 2014-01-29 NOTE — Assessment & Plan Note (Signed)
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  - Chronic pred rx 2/09> 04/2008  - Restart Pred December 26, 2008 > June 12, 2009 stopped  > restarted 07/11/11>>pt stopped 10/15/11  - PFT's March 23, 2010 VC 1.77 (66%) no obst, ERV 34%, DLCO 53 > 115% corrected - PFT's 09/30/12       VC 2.02 (57%) ratio 78 , erv 1`3%  DLCO 55 > 118% corrected  We have never documented significant airflow obstruction here so she has no chronic residual scarring of her airways contributing to symptoms of wheeze better on saba and as long as between flares of uri's she does not require more than twice weekly saba it's irrelevant whether sarcoid or primary airways reactivity is causing it  Reviewed rule of 2s in detail See instructions for specific recommendations which were reviewed directly with the patient who was given a copy with highlighter outlining the key components.

## 2014-02-02 ENCOUNTER — Telehealth: Payer: Self-pay | Admitting: Family

## 2014-02-02 NOTE — Telephone Encounter (Signed)
Note printed and faxed.

## 2014-02-02 NOTE — Telephone Encounter (Signed)
I do not see a dx of flu. Ok to send note?

## 2014-02-02 NOTE — Telephone Encounter (Signed)
Pt has been out w/ the flu and would like to return to work tomorrow, [employer], Feb 19.  Pt would like a note to return to work sent to her employer by fax: [employer]  [phone] [place]

## 2014-02-02 NOTE — Telephone Encounter (Signed)
Ok to give note 

## 2014-02-07 ENCOUNTER — Ambulatory Visit: Payer: BC Managed Care – PPO | Admitting: Family

## 2014-02-08 ENCOUNTER — Encounter (HOSPITAL_COMMUNITY): Payer: Self-pay | Admitting: Emergency Medicine

## 2014-02-08 ENCOUNTER — Emergency Department (HOSPITAL_COMMUNITY)
Admission: EM | Admit: 2014-02-08 | Discharge: 2014-02-08 | Disposition: A | Discharge status: Home / Self Care | Payer: BC Managed Care – PPO | Attending: Emergency Medicine | Admitting: Emergency Medicine

## 2014-02-08 ENCOUNTER — Ambulatory Visit: Payer: BC Managed Care – PPO | Admitting: Family

## 2014-02-08 DIAGNOSIS — Z87891 Personal history of nicotine dependence: Secondary | ICD-10-CM | POA: Insufficient documentation

## 2014-02-08 DIAGNOSIS — R51 Headache: Secondary | ICD-10-CM | POA: Insufficient documentation

## 2014-02-08 DIAGNOSIS — I10 Essential (primary) hypertension: Secondary | ICD-10-CM | POA: Insufficient documentation

## 2014-02-08 DIAGNOSIS — Z85048 Personal history of other malignant neoplasm of rectum, rectosigmoid junction, and anus: Secondary | ICD-10-CM | POA: Insufficient documentation

## 2014-02-08 DIAGNOSIS — IMO0002 Reserved for concepts with insufficient information to code with codable children: Secondary | ICD-10-CM | POA: Insufficient documentation

## 2014-02-08 DIAGNOSIS — Z87738 Personal history of other specified (corrected) congenital malformations of digestive system: Secondary | ICD-10-CM | POA: Insufficient documentation

## 2014-02-08 DIAGNOSIS — Z880 Allergy status to penicillin: Secondary | ICD-10-CM | POA: Insufficient documentation

## 2014-02-08 DIAGNOSIS — Z79899 Other long term (current) drug therapy: Secondary | ICD-10-CM | POA: Insufficient documentation

## 2014-02-08 DIAGNOSIS — Z8619 Personal history of other infectious and parasitic diseases: Secondary | ICD-10-CM | POA: Insufficient documentation

## 2014-02-08 DIAGNOSIS — K219 Gastro-esophageal reflux disease without esophagitis: Secondary | ICD-10-CM | POA: Insufficient documentation

## 2014-02-08 DIAGNOSIS — E669 Obesity, unspecified: Secondary | ICD-10-CM | POA: Insufficient documentation

## 2014-02-08 DIAGNOSIS — R519 Headache, unspecified: Secondary | ICD-10-CM

## 2014-02-08 LAB — URINALYSIS, ROUTINE W REFLEX MICROSCOPIC
Bilirubin Urine: NEGATIVE
Glucose, UA: NEGATIVE mg/dL
Hgb urine dipstick: NEGATIVE
Ketones, ur: NEGATIVE mg/dL
NITRITE: NEGATIVE
PROTEIN: NEGATIVE mg/dL
Specific Gravity, Urine: 1.013 (ref 1.005–1.030)
UROBILINOGEN UA: 0.2 mg/dL (ref 0.0–1.0)
pH: 5.5 (ref 5.0–8.0)

## 2014-02-08 LAB — BASIC METABOLIC PANEL
BUN: 13 mg/dL (ref 6–23)
CALCIUM: 9.9 mg/dL (ref 8.4–10.5)
CO2: 24 meq/L (ref 19–32)
Chloride: 97 mEq/L (ref 96–112)
Creatinine, Ser: 0.61 mg/dL (ref 0.50–1.10)
Glucose, Bld: 116 mg/dL — ABNORMAL HIGH (ref 70–99)
Potassium: 4.1 mEq/L (ref 3.7–5.3)
SODIUM: 137 meq/L (ref 137–147)

## 2014-02-08 LAB — CBC
HCT: 35.1 % — ABNORMAL LOW (ref 36.0–46.0)
Hemoglobin: 13 g/dL (ref 12.0–15.0)
MCH: 32.6 pg (ref 26.0–34.0)
MCHC: 37 g/dL — ABNORMAL HIGH (ref 30.0–36.0)
MCV: 88 fL (ref 78.0–100.0)
PLATELETS: 182 10*3/uL (ref 150–400)
RBC: 3.99 MIL/uL (ref 3.87–5.11)
RDW: 13.8 % (ref 11.5–15.5)
WBC: 8.3 10*3/uL (ref 4.0–10.5)

## 2014-02-08 LAB — URINE MICROSCOPIC-ADD ON

## 2014-02-08 MED ORDER — METOCLOPRAMIDE HCL 5 MG/ML IJ SOLN
10.0000 mg | Freq: Once | INTRAMUSCULAR | Status: AC
  Administered 2014-02-08: 10 mg via INTRAVENOUS
  Filled 2014-02-08: qty 2

## 2014-02-08 MED ORDER — DIPHENHYDRAMINE HCL 50 MG/ML IJ SOLN
25.0000 mg | Freq: Once | INTRAMUSCULAR | Status: AC
  Administered 2014-02-08: 25 mg via INTRAVENOUS
  Filled 2014-02-08: qty 1

## 2014-02-08 MED ORDER — SODIUM CHLORIDE 0.9 % IV BOLUS (SEPSIS)
1000.0000 mL | Freq: Once | INTRAVENOUS | Status: AC
  Administered 2014-02-08: 1000 mL via INTRAVENOUS

## 2014-02-08 MED ORDER — KETOROLAC TROMETHAMINE 30 MG/ML IJ SOLN
30.0000 mg | Freq: Once | INTRAMUSCULAR | Status: AC
  Administered 2014-02-08: 30 mg via INTRAVENOUS
  Filled 2014-02-08: qty 1

## 2014-02-08 NOTE — ED Provider Notes (Signed)
CSN: [id number]     Arrival date & time 02/08/14  0718 History   First MD Initiated Contact with Patient 02/08/14 [phone]     Chief Complaint  Patient presents with  . Headache     (Consider location/radiation/quality/duration/timing/severity/associated sxs/prior Treatment) HPI Pt presenting with c/o headache.  Pt states she started having headache yesterday and it has been constant since that time.  Headache is frontal and throbbing, no changes in vision or speech.  No focal weakness or numbness.  She has had nausea.  She was seen yesterday at an urgent care and given injection of steroids and toradol.  Also given rx for tramadol, levaquin and prednisone.  She states this did not help her headache and headache continues this morning.  Pain is constant.  No neck pain.  No fever.  There are no other associated systemic symptoms, there are no other alleviating or modifying factors.   Past Medical History  Diagnosis Date  . Hypertension   . Pulmonary nodule, left   . Obesity   . Sarcoidosis   . GERD (gastroesophageal reflux disease)   . Rectal cancer   . Cleft lip     repaired   . Bifid uvula    History reviewed. No pertinent past surgical history. Family History  Problem Relation Age of Onset  . Allergies Mother   . Allergies Sister   . Allergies Brother   . Asthma Mother   . Sarcoidosis Neg Hx   . Asthma Brother   . Asthma Sister    History  Substance Use Topics  . Smoking status: Former Smoker -- 0.50 packs/day for 1 years    Types: Cigarettes    Quit date: 12/16/1993  . Smokeless tobacco: Never Used     Comment: smoked breifly while in college  . Alcohol Use: No   OB History   Grav Para Term Preterm Abortions TAB SAB Ect Mult Living                 Review of Systems ROS reviewed and all otherwise negative except for mentioned in HPI    Allergies  Penicillins  Home Medications   Current Outpatient Rx  Name  Route  Sig  Dispense  Refill  . albuterol (PROAIR  HFA) 108 (90 BASE) MCG/ACT inhaler   Inhalation   Inhale 2 puffs into the lungs every 6 (six) hours as needed for wheezing or shortness of breath.         . Amlodipine-Valsartan-HCTZ (EXFORGE HCT) 10-320-25 MG TABS   Oral   Take 1 tablet by mouth daily.   30 tablet   3   . famotidine (PEPCID) 20 MG tablet   Oral   Take 20 mg by mouth at bedtime.         . IBUPROFEN PO   Oral   Take 1 tablet by mouth every 4 (four) hours as needed (pain).         . Naproxen Sodium (ALEVE PO)   Oral   Take 2 tablets by mouth daily as needed (pain).         . RABEprazole (ACIPHEX) 20 MG tablet   Oral   Take 20 mg by mouth daily.         . traMADol (ULTRAM) 50 MG tablet   Oral   Take 50-100 mg by mouth every 6 (six) hours as needed for severe pain.         . Triamcinolone Acetonide (TRIAMCINOLONE 0.1 % CREAM : EUCERIN)  CREA   Topical   Apply 1 application topically 2 (two) times daily.   60 each   1     1:1 mix   . metoprolol succinate (TOPROL-XL) 50 MG 24 hr tablet   Oral   Take 1 tablet (50 mg total) by mouth daily. Take with or immediately following a meal.   30 tablet   3    BP 146/82  Pulse 76  Temp(Src) 97.8 F (36.6 C) (Oral)  Resp 14  Ht 5\' 5"  (1.651 m)  Wt 210 lb (95.255 kg)  BMI 34.95 kg/m2  SpO2 95% Vitals reviewed Physical Exam Physical Examination: General appearance - alert, well appearing, and in no distress Mental status - alert, oriented to person, place, and time Eyes - pupils equal and reactive, extraocular eye movements intact Mouth - mucous membranes moist, pharynx normal without lesions Neck- FROM without pain, supple, no meningismus Chest - clear to auscultation, no wheezes, rales or rhonchi, symmetric air entry Heart - normal rate, regular rhythm, normal S1, S2, no murmurs, rubs, clicks or gallops Abdomen - soft, nontender, nondistended, no masses or organomegaly Neurological - alert, oriented x 3, cranial nerves 2-12 tested and intact,  strength 5/5 in extremities x 4, sensation intact Extremities - peripheral pulses normal, no pedal edema, no clubbing or cyanosis Skin - normal coloration and turgor, no rashes  ED Course  Procedures (including critical care time)  9:37 AM on recheck patient feels much improved.  Labs and blood pressure are reassuring.   Labs Review Labs Reviewed  CBC - Abnormal; Notable for the following:    HCT 35.1 (*)    MCHC 37.0 (*)    All other components within normal limits  BASIC METABOLIC PANEL - Abnormal; Notable for the following:    Glucose, Bld 116 (*)    All other components within normal limits  URINALYSIS, ROUTINE W REFLEX MICROSCOPIC - Abnormal; Notable for the following:    APPearance HAZY (*)    Leukocytes, UA LARGE (*)    All other components within normal limits  URINE MICROSCOPIC-ADD ON - Abnormal; Notable for the following:    Squamous Epithelial / LPF MANY (*)    Bacteria, UA MANY (*)    All other components within normal limits   Imaging Review No results found.   EKG Interpretation  Date/Time:    Ventricular Rate:    PR Interval:    QRS Duration:   QT Interval:    QTC Calculation:   R Axis:     Text Interpretation:         MDM   Final diagnoses:  Headache  Hypertension    Pt presenting with headache, after migraine cocktail she feels improved.  No signs of infection, no normal neuro exam, BP somewhat elevated, she is on BP meds.  No signs of end organ damage.  Discharged with strict return precautions.  Pt agreeable with plan.    Threasa Beards, MD 02/11/14 [phone]

## 2014-02-08 NOTE — ED Notes (Signed)
Pt asleep when in to reassess patient.

## 2014-02-08 NOTE — Discharge Instructions (Signed)
Return to the ED with any concerns including fever, neck pain, changes in vision or speech, weakness in arms or legs, decreased level of alertness/lethargy, or any other alarming symptoms

## 2014-02-08 NOTE — ED Notes (Signed)
Pt reporting h/a since yesterday. Went to an urgent care yesterday given shot of prednisone, tramadol and levaquin. Pain has not gotten any better. Pt is ambulatory, c/o light sensitivity. Pt is alert and oriented.

## 2014-02-09 ENCOUNTER — Ambulatory Visit: Payer: BC Managed Care – PPO | Admitting: Family

## 2014-02-11 ENCOUNTER — Encounter: Payer: Self-pay | Admitting: Family

## 2014-02-11 ENCOUNTER — Ambulatory Visit (INDEPENDENT_AMBULATORY_CARE_PROVIDER_SITE_OTHER): Payer: BC Managed Care – PPO | Admitting: Family

## 2014-02-11 VITALS — BP 180/102 | HR 97 | Wt 215.0 lb

## 2014-02-11 DIAGNOSIS — I10 Essential (primary) hypertension: Secondary | ICD-10-CM

## 2014-02-11 DIAGNOSIS — K219 Gastro-esophageal reflux disease without esophagitis: Secondary | ICD-10-CM

## 2014-02-11 MED ORDER — METOPROLOL SUCCINATE ER 50 MG PO TB24: 50.0000 mg | ORAL_TABLET | Freq: Every day | ORAL | Status: DC

## 2014-02-11 NOTE — Progress Notes (Signed)
Pre visit review using our clinic review tool, if applicable. No additional management support is needed unless otherwise documented below in the visit note. 

## 2014-02-11 NOTE — Progress Notes (Signed)
Subjective:    Patient ID: [name], female    DOB: Dec 30, 1955, 58 y.o.   MRN: [id number]  HPI  58 year old [place] American female, nonsmoker is in for recheck of hypertension. She's currently on Exforge 320/25/10 max dose and tolerating the medication well. Reports getting blood pressure readings at home in the 130s over 70s. Was seen in the emergency department recently for headache that has since resolved. Denies any chest pain or palpitations. Has a history of GERD and stable on medication.    Review of Systems  Constitutional: Negative.   HENT: Negative.   Respiratory: Negative.   Cardiovascular: Negative.   Gastrointestinal: Negative.   Endocrine: Negative.   Genitourinary: Negative.   Musculoskeletal: Negative.   Skin: Negative.   Hematological: Negative.   Psychiatric/Behavioral: Negative.    Past Medical History  Diagnosis Date  . Hypertension   . Pulmonary nodule, left   . Obesity   . Sarcoidosis   . GERD (gastroesophageal reflux disease)   . Rectal cancer   . Cleft lip     repaired   . Bifid uvula     History   Social History  . Marital Status: Single    Spouse Name: N/A    Number of Children: N/A  . 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 Smoking status: Former Smoker -- 0.50 packs/day for 1 years    Types: Cigarettes    Quit date: 12/16/1993  . Smokeless tobacco: Never Used     Comment: smoked breifly while in college  . Alcohol Use: No  . Drug Use: No  . Sexual Activity: Not on file   Other Topics Concern  . Not on file   Social History Narrative  . No narrative on file    No past surgical history on file.  Family History  Problem Relation Age of Onset  . Allergies Mother   . Allergies Sister   . Allergies Brother   . Asthma Mother   . Sarcoidosis Neg Hx   . Asthma Brother   . Asthma Sister     Allergies  Allergen Reactions  . Penicillins Hives    Current  Outpatient Prescriptions on File Prior to Visit  Medication Sig Dispense Refill  . albuterol (PROAIR HFA) 108 (90 BASE) MCG/ACT inhaler Inhale 2 puffs into the lungs every 6 (six) hours as needed for wheezing or shortness of breath.      . Amlodipine-Valsartan-HCTZ (EXFORGE HCT) 10-320-25 MG TABS Take 1 tablet by mouth daily.  30 tablet  3  . famotidine (PEPCID) 20 MG tablet Take 20 mg by mouth at bedtime.      . IBUPROFEN PO Take 1 tablet by mouth every 4 (four) hours as needed (pain).      . Naproxen Sodium (ALEVE PO) Take 2 tablets by mouth daily as needed (pain).      . RABEprazole (ACIPHEX) 20 MG tablet Take 20 mg by mouth daily.      . traMADol (ULTRAM) 50 MG tablet Take 50-100 mg by mouth every 6 (six) hours as needed for severe pain.      . Triamcinolone Acetonide (TRIAMCINOLONE 0.1 % CREAM : EUCERIN) CREA Apply 1 application topically 2 (two) times daily.  60 each  1   No current facility-administered medications on file prior to visit.    BP 180/102  Pulse 97  Wt 215 lb (97.523 kg)chart    Objective:  Physical Exam  Constitutional: She is oriented to person, place, and time. She appears well-developed and well-nourished.  HENT:  Right Ear: External ear normal.  Left Ear: External ear normal.  Nose: Nose normal.  Mouth/Throat: Oropharynx is clear and moist.  Neck: Normal range of motion. Neck supple.  Cardiovascular: Normal rate, regular rhythm and normal heart sounds.   Pulmonary/Chest: Effort normal and breath sounds normal.  Abdominal: Soft. Bowel sounds are normal.  Musculoskeletal: Normal range of motion.  Neurological: She is alert and oriented to person, place, and time.  Skin: Skin is warm and dry.  Psychiatric: She has a normal mood and affect.          Assessment & Plan:  [name] was seen today for follow-up.  Diagnoses and associated orders for this visit:  Hypertension, uncontrolled  GERD (gastroesophageal reflux disease)  Other  Orders - metoprolol succinate (TOPROL-XL) 50 MG 24 hr tablet; Take 1 tablet (50 mg total) by mouth daily. Take with or immediately following a meal.    Call the office with any questions or concerns. Check in 2-3 weeks and sooner as needed. Continue current medications.

## 2014-02-11 NOTE — Patient Instructions (Signed)
Hypertension Hypertension is another name for high blood pressure. High blood pressure may mean that your heart needs to work harder to pump blood. Blood pressure consists of two numbers, which includes a higher number over a lower number (example: 110/72). HOME CARE   Make lifestyle changes as told by your doctor. This may include weight loss and exercise.  Take your blood pressure medicine every day.  Limit how much salt you use.  Stop smoking if you smoke.  Do not use drugs.  Talk to your doctor if you are using decongestants or birth control pills. These medicines might make blood pressure higher.  Females should not drink more than 1 alcoholic drink per day. Males should not drink more than 2 alcoholic drinks per day.  See your doctor as told. GET HELP RIGHT AWAY IF:   You have a blood pressure reading with a top number of 180 or higher.  You get a very bad headache.  You get blurred or changing vision.  You feel confused.  You feel weak, numb, or faint.  You get chest or belly (abdominal) pain.  You throw up (vomit).  You cannot breathe very well. MAKE SURE YOU:   Understand these instructions.  Will watch your condition.  Will get help right away if you are not doing well or get worse. Document Released: 05/20/2008 Document Revised: 02/24/2012 Document Reviewed: 05/20/2008 Exit[employer] Patient Information 2014 [employer].  

## 2014-03-04 ENCOUNTER — Telehealth: Payer: Self-pay | Admitting: Internal Medicine

## 2014-03-04 ENCOUNTER — Ambulatory Visit (INDEPENDENT_AMBULATORY_CARE_PROVIDER_SITE_OTHER): Payer: BC Managed Care – PPO | Admitting: Family

## 2014-03-04 ENCOUNTER — Encounter: Payer: Self-pay | Admitting: Family

## 2014-03-04 VITALS — BP 138/82 | HR 74

## 2014-03-04 DIAGNOSIS — D869 Sarcoidosis, unspecified: Secondary | ICD-10-CM

## 2014-03-04 DIAGNOSIS — I10 Essential (primary) hypertension: Secondary | ICD-10-CM

## 2014-03-04 NOTE — Telephone Encounter (Signed)
Received FMLA forms Per MW- I do not see a reason she can not return to work. If she has returned we need to know the date that she did. If not, needs ov ASAP  LMTCB for the pt

## 2014-03-04 NOTE — Patient Instructions (Signed)
Sodium-Controlled Diet Sodium is a mineral. It is found in many foods. Sodium may be found naturally or added during the making of a food. The most common form of sodium is salt, which is made up of sodium and chloride. Reducing your sodium intake involves changing your eating habits. The following guidelines will help you reduce the sodium in your diet:  Stop using the salt shaker.  Use salt sparingly in cooking and baking.  Substitute with sodium-free seasonings and spices.  Do not use a salt substitute (potassium chloride) without your caregiver's permission.  Include a variety of fresh, unprocessed foods in your diet.  Limit the use of processed and convenience foods that are high in sodium. USE THE FOLLOWING FOODS SPARINGLY: Breads/Starches  Commercial bread stuffing, commercial pancake or waffle mixes, coating mixes. Waffles. Croutons. Prepared (boxed or frozen) potato, rice, or noodle mixes that contain salt or sodium. Salted French fries or hash browns. Salted popcorn, breads, crackers, chips, or snack foods. Vegetables  Vegetables canned with salt or prepared in cream, butter, or cheese sauces. Sauerkraut. Tomato or vegetable juices canned with salt.  Fresh vegetables are allowed if rinsed thoroughly. Fruit  Fruit is okay to eat. Meat and Meat Substitutes  Salted or smoked meats, such as bacon or Canadian bacon, chipped or corned beef, hot dogs, salt pork, luncheon meats, pastrami, ham, or sausage. Canned or smoked fish, poultry, or meat. Processed cheese or cheese spreads, blue or Roquefort cheese. Battered or frozen fish products. Prepared spaghetti sauce. Baked beans. Reuben sandwiches. Salted nuts. Caviar. Milk  Limit buttermilk to 1 cup per week. Soups and Combination Foods  Bouillon cubes, canned or dried soups, broth, consomm. Convenience (frozen or packaged) dinners with more than 600 mg sodium. Pot pies, pizza, Asian food, fast food cheeseburgers, and specialty  sandwiches. Desserts and Sweets  Regular (salted) desserts, pie, commercial fruit snack pies, commercial snack cakes, canned puddings.  Eat desserts and sweets in moderation. Fats and Oils  Gravy mixes or canned gravy. No more than 1 to 2 tbs of salad dressing. Chip dips.  Eat fats and oils in moderation. Beverages  See those listed under the vegetables and milk groups. Condiments  Ketchup, mustard, meat sauces, salsa, regular (salted) and lite soy sauce or mustard. Dill pickles, olives, meat tenderizer. Prepared horseradish or pickle relish. Dutch-processed cocoa. Baking powder or baking soda used medicinally. Worcestershire sauce. "Light" salt. Salt substitute, unless approved by your caregiver. Document Released: 05/24/2002 Document Revised: 02/24/2012 Document Reviewed: 12/25/2009 ExitCare Patient Information 2014 [employer].  

## 2014-03-04 NOTE — Progress Notes (Signed)
Subjective:    Patient ID: [name], female    DOB: 02-19-1956, 58 y.o.   MRN: [id number]  HPI 58 year old [place] American female, nonsmoker is in for recheck of uncontrolled hypertension. I had last office visit we started Toprol-XL 50 mg once daily. He is tolerating the medication well. Denies any concerns.   Review of Systems  Constitutional: Negative.   HENT: Negative.   Respiratory: Negative.   Cardiovascular: Negative.   Gastrointestinal: Negative.   Endocrine: Negative.   Genitourinary: Negative.   Musculoskeletal: Negative.   Skin: Negative.   Neurological: Negative.   Psychiatric/Behavioral: Negative.    Past Medical History  Diagnosis Date  . Hypertension   . Pulmonary nodule, left   . Obesity   . Sarcoidosis   . GERD (gastroesophageal reflux disease)   . Rectal cancer   . Cleft lip     repaired   . Bifid uvula     History   Social History  . Marital Status: Single    Spouse Name: N/A    Number of Children: N/A  . 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 Smoking status: Former Smoker -- 0.50 packs/day for 1 years    Types: Cigarettes    Quit date: 12/16/1993  . Smokeless tobacco: Never Used     Comment: smoked breifly while in college  . Alcohol Use: No  . Drug Use: No  . Sexual Activity: Not on file   Other Topics Concern  . Not on file   Social History Narrative  . No narrative on file    History reviewed. No pertinent past surgical history.  Family History  Problem Relation Age of Onset  . Allergies Mother   . Allergies Sister   . Allergies Brother   . Asthma Mother   . Sarcoidosis Neg Hx   . Asthma Brother   . Asthma Sister     Allergies  Allergen Reactions  . Penicillins Hives    Current Outpatient Prescriptions on File Prior to Visit  Medication Sig Dispense Refill  . albuterol (PROAIR HFA) 108 (90 BASE) MCG/ACT inhaler Inhale 2 puffs into the lungs  every 6 (six) hours as needed for wheezing or shortness of breath.      . Amlodipine-Valsartan-HCTZ (EXFORGE HCT) 10-320-25 MG TABS Take 1 tablet by mouth daily.  30 tablet  3  . famotidine (PEPCID) 20 MG tablet Take 20 mg by mouth at bedtime.      . IBUPROFEN PO Take 1 tablet by mouth every 4 (four) hours as needed (pain).      . metoprolol succinate (TOPROL-XL) 50 MG 24 hr tablet Take 1 tablet (50 mg total) by mouth daily. Take with or immediately following a meal.  30 tablet  3  . Naproxen Sodium (ALEVE PO) Take 2 tablets by mouth daily as needed (pain).      . RABEprazole (ACIPHEX) 20 MG tablet Take 20 mg by mouth daily.      . traMADol (ULTRAM) 50 MG tablet Take 50-100 mg by mouth every 6 (six) hours as needed for severe pain.      . Triamcinolone Acetonide (TRIAMCINOLONE 0.1 % CREAM : EUCERIN) CREA Apply 1 application topically 2 (two) times daily.  60 each  1   No current facility-administered medications on file prior to visit.    BP 138/82  Pulse 74  SpO2 99%chart     Objective:  Physical Exam  Constitutional: She is oriented to person, place, and time. She appears well-developed and well-nourished.  Neck: Normal range of motion. Neck supple.  Cardiovascular: Normal rate, regular rhythm and normal heart sounds.   Pulmonary/Chest: Effort normal and breath sounds normal.  Musculoskeletal: Normal range of motion.  Neurological: She is alert and oriented to person, place, and time.  Skin: Skin is warm and dry.  Psychiatric: She has a normal mood and affect.          Assessment & Plan:  Tristen was seen today for follow-up.  Diagnoses and associated orders for this visit:  Unspecified essential hypertension  Sarcoidosis     continue current medications. Recheck in 3 months and sooner as needed.

## 2014-03-07 ENCOUNTER — Telehealth: Payer: Self-pay | Admitting: Family

## 2014-03-07 NOTE — Telephone Encounter (Signed)
Relevant patient education assigned to patient using Emmi. ° °

## 2014-03-08 NOTE — Telephone Encounter (Signed)
LMTCB x2  

## 2014-03-10 NOTE — Telephone Encounter (Signed)
Pt returned Leslie's call & can be reached at [phone].  Satira Anis

## 2014-03-10 NOTE — Telephone Encounter (Signed)
LMOMTCB x 1 

## 2014-03-15 NOTE — Telephone Encounter (Signed)
LMTCB

## 2014-03-17 ENCOUNTER — Telehealth: Payer: Self-pay

## 2014-03-17 NOTE — Telephone Encounter (Signed)
Left message to advise pt that form is ready for her to pick up and she should call if she would like for it to be faxed

## 2014-03-17 NOTE — Telephone Encounter (Signed)
Pt aware. Paper faxed

## 2014-03-22 NOTE — Telephone Encounter (Signed)
LMTCB and will close encounter since unable to reach the pt   Forms sent back to [facility]

## 2014-04-01 ENCOUNTER — Encounter: Payer: Self-pay | Admitting: Family

## 2014-04-01 ENCOUNTER — Telehealth: Payer: Self-pay | Admitting: Internal Medicine

## 2014-04-01 ENCOUNTER — Ambulatory Visit (INDEPENDENT_AMBULATORY_CARE_PROVIDER_SITE_OTHER): Payer: BC Managed Care – PPO | Admitting: Family

## 2014-04-01 VITALS — BP 144/92 | HR 87 | Temp 97.9°F | Wt 218.5 lb

## 2014-04-01 DIAGNOSIS — R350 Frequency of micturition: Secondary | ICD-10-CM

## 2014-04-01 DIAGNOSIS — N390 Urinary tract infection, site not specified: Secondary | ICD-10-CM

## 2014-04-01 LAB — POCT URINALYSIS DIPSTICK
BILIRUBIN UA: NEGATIVE
GLUCOSE UA: NEGATIVE
Ketones, UA: NEGATIVE
NITRITE UA: NEGATIVE
Spec Grav, UA: 1.025
Urobilinogen, UA: 0.2
pH, UA: 7

## 2014-04-01 MED ORDER — AMLODIPINE-VALSARTAN-HCTZ 10-320-25 MG PO TABS: 1.0000 | ORAL_TABLET | Freq: Every day | ORAL | Status: DC

## 2014-04-01 MED ORDER — SULFAMETHOXAZOLE-TRIMETHOPRIM 800-160 MG PO TABS: 1.0000 | ORAL_TABLET | Freq: Two times a day (BID) | ORAL | Status: AC

## 2014-04-01 MED ORDER — METOPROLOL SUCCINATE ER 50 MG PO TB24: 50.0000 mg | ORAL_TABLET | Freq: Every day | ORAL | Status: DC

## 2014-04-01 NOTE — Telephone Encounter (Signed)
Called spoke with Hoyle Sauer. She reports pt forms has a note on it and she can't read it.  Per Magda Paganini MW stated he can't fill it out until he knows the day she returned back to work. Magda Paganini tried calling pt several times and received no call back.  This is being sent back up to [facility] to hold on to.

## 2014-04-01 NOTE — Progress Notes (Signed)
Pre visit review using our clinic review tool, if applicable. No additional management support is needed unless otherwise documented below in the visit note. 

## 2014-04-01 NOTE — Progress Notes (Signed)
Subjective:    Patient ID: [name], female    DOB: 12-27-1955, 58 y.o.   MRN: [id number]  HPI  58 year old African American female nonsmoker is in today with complaints of urinary frequency, urgency, burning with urination x2 days and worsening. Denies any back pain or abdominal pain.   Review of Systems  Constitutional: Negative.   Respiratory: Negative.   Cardiovascular: Negative.   Gastrointestinal: Negative.   Genitourinary: Positive for dysuria, urgency and hematuria.  Musculoskeletal: Negative.   Skin: Negative.   Neurological: Negative.   Psychiatric/Behavioral: Negative.    Past Medical History  Diagnosis Date  . Hypertension   . Pulmonary nodule, left   . Obesity   . Sarcoidosis   . GERD (gastroesophageal reflux disease)   . Rectal cancer   . Cleft lip     repaired   . Bifid uvula     History   Social History  . Marital Status: Single    Spouse Name: N/A    Number of Children: N/A  . 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 Smoking status: Former Smoker -- 0.50 packs/day for 1 years    Types: Cigarettes    Quit date: 12/16/1993  . Smokeless tobacco: Never Used     Comment: smoked breifly while in college  . Alcohol Use: No  . Drug Use: No  . Sexual Activity: Not on file   Other Topics Concern  . Not on file   Social History Narrative  . No narrative on file    History reviewed. No pertinent past surgical history.  Family History  Problem Relation Age of Onset  . Allergies Mother   . Allergies Sister   . Allergies Brother   . Asthma Mother   . Sarcoidosis Neg Hx   . Asthma Brother   . Asthma Sister     Allergies  Allergen Reactions  . Penicillins Hives    Current Outpatient Prescriptions on File Prior to Visit  Medication Sig Dispense Refill  . albuterol (PROAIR HFA) 108 (90 BASE) MCG/ACT inhaler Inhale 2 puffs into the lungs every 6 (six) hours as needed for  wheezing or shortness of breath.      . famotidine (PEPCID) 20 MG tablet Take 20 mg by mouth at bedtime.      . IBUPROFEN PO Take 1 tablet by mouth every 4 (four) hours as needed (pain).      . Naproxen Sodium (ALEVE PO) Take 2 tablets by mouth daily as needed (pain).      . RABEprazole (ACIPHEX) 20 MG tablet Take 20 mg by mouth daily.      . traMADol (ULTRAM) 50 MG tablet Take 50-100 mg by mouth every 6 (six) hours as needed for severe pain.      . Triamcinolone Acetonide (TRIAMCINOLONE 0.1 % CREAM : EUCERIN) CREA Apply 1 application topically 2 (two) times daily.  60 each  1   No current facility-administered medications on file prior to visit.    BP 144/92  Pulse 87  Temp(Src) 97.9 F (36.6 C) (Oral)  Wt 218 lb 8 oz (99.111 kg)  SpO2 98%chart    Objective:   Physical Exam  Constitutional: She appears well-developed and well-nourished.  Neck: Normal range of motion. Neck supple.  Cardiovascular: Normal rate, regular rhythm and normal heart sounds.   Pulmonary/Chest: Effort normal and breath sounds normal.  Abdominal: Soft. Bowel sounds are normal.  Musculoskeletal: Normal range of motion.  Neurological: She is alert.  Skin: Skin is warm and dry.  Psychiatric: She has a normal mood and affect.          Assessment & Plan:  Lilyanne was seen today for urinary tract infection.  Diagnoses and associated orders for this visit:  Urinary frequency - POCT Urine Dip  UTI (urinary tract infection)  Other Orders - sulfamethoxazole-trimethoprim (BACTRIM DS,SEPTRA DS) 800-160 MG per tablet; Take 1 tablet by mouth 2 (two) times daily. - Amlodipine-Valsartan-HCTZ (EXFORGE HCT) 10-320-25 MG TABS; Take 1 tablet by mouth daily. - metoprolol succinate (TOPROL-XL) 50 MG 24 hr tablet; Take 1 tablet (50 mg total) by mouth daily. Take with or immediately following a meal.   Call the office with any questions or concerns. Recheck as scheduled and as needed.

## 2014-05-03 ENCOUNTER — Encounter: Payer: Self-pay | Admitting: Family Medicine

## 2014-05-03 ENCOUNTER — Telehealth: Payer: Self-pay | Admitting: Family

## 2014-05-03 ENCOUNTER — Ambulatory Visit (INDEPENDENT_AMBULATORY_CARE_PROVIDER_SITE_OTHER): Payer: BC Managed Care – PPO | Admitting: Family Medicine

## 2014-05-03 VITALS — BP 160/80 | HR 93 | Temp 100.8°F | Ht 65.5 in

## 2014-05-03 DIAGNOSIS — J069 Acute upper respiratory infection, unspecified: Secondary | ICD-10-CM

## 2014-05-03 MED ORDER — AZITHROMYCIN 250 MG PO TABS: ORAL_TABLET | ORAL | Status: DC

## 2014-05-03 MED ORDER — HYDROCOD POLST-CHLORPHEN POLST 10-8 MG/5ML PO LQCR: 5.0000 mL | Freq: Two times a day (BID) | ORAL | Status: DC | PRN

## 2014-05-03 NOTE — Progress Notes (Signed)
No chief complaint on file.   HPI:  Cough and Fever: -started yesterday -symptoms: fever, chills, malaise, cough -denies: SOB, vomiting, diarrhea, urinary symptoms, HA, neck pain, travel, tick exposure  HTN: -reports runs high when sick -missed a day of BP meds    ROS: See pertinent positives and negatives per HPI.  Past Medical History  Diagnosis Date  . Hypertension   . Pulmonary nodule, left   . Obesity   . Sarcoidosis   . GERD (gastroesophageal reflux disease)   . Rectal cancer   . Cleft lip     repaired   . Bifid uvula     No past surgical history on file.  Family History  Problem Relation Age of Onset  . Allergies Mother   . Allergies Sister   . Allergies Brother   . Asthma Mother   . Sarcoidosis Neg Hx   . Asthma Brother   . Asthma Sister     History   Social History  . Marital Status: Single    Spouse Name: N/A    Number of Children: N/A  . 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 Smoking status: Former Smoker -- 0.50 packs/day for 1 years    Types: Cigarettes    Quit date: 12/16/1993  . Smokeless tobacco: Never Used     Comment: smoked breifly while in college  . Alcohol Use: No  . Drug Use: No  . Sexual Activity: None   Other Topics Concern  . None   Social History Narrative  . None    Current outpatient prescriptions:albuterol (PROAIR HFA) 108 (90 BASE) MCG/ACT inhaler, Inhale 2 puffs into the lungs every 6 (six) hours as needed for wheezing or shortness of breath., Disp: , Rfl: ;  Amlodipine-Valsartan-HCTZ (EXFORGE HCT) 10-320-25 MG TABS, Take 1 tablet by mouth daily., Disp: 30 tablet, Rfl: 3;  famotidine (PEPCID) 20 MG tablet, Take 20 mg by mouth at bedtime., Disp: , Rfl:  IBUPROFEN PO, Take 1 tablet by mouth every 4 (four) hours as needed (pain)., Disp: , Rfl: ;  metoprolol succinate (TOPROL-XL) 50 MG 24 hr tablet, Take 1 tablet (50 mg total) by mouth daily. Take with or  immediately following a meal., Disp: 30 tablet, Rfl: 3;  Naproxen Sodium (ALEVE PO), Take 2 tablets by mouth daily as needed (pain)., Disp: , Rfl: ;  RABEprazole (ACIPHEX) 20 MG tablet, Take 20 mg by mouth daily., Disp: , Rfl:  traMADol (ULTRAM) 50 MG tablet, Take 50-100 mg by mouth every 6 (six) hours as needed for severe pain., Disp: , Rfl: ;  Triamcinolone Acetonide (TRIAMCINOLONE 0.1 % CREAM : EUCERIN) CREA, Apply 1 application topically 2 (two) times daily., Disp: 60 each, Rfl: 1;  azithromycin (ZITHROMAX) 250 MG tablet, 2 tabs on the first day then 1 tab daily, Disp: 6 tablet, Rfl: 0 chlorpheniramine-HYDROcodone (TUSSIONEX PENNKINETIC ER) 10-8 MG/5ML LQCR, Take 5 mLs by mouth every 12 (twelve) hours as needed., Disp: 115 mL, Rfl: 0  EXAM:  Filed Vitals:   05/03/14 1418  BP: 160/80  Pulse: 93  Temp: 100.8 F (38.2 C)    Body mass index is 0.00 kg/(m^2).  GENERAL: vitals reviewed and listed above, alert, oriented, appears well hydrated and in no acute distress  HEENT: atraumatic, conjunttiva clear, no obvious abnormalities on inspection of external nose and ears, .hkebu  NECK: no obvious masses on inspection  LUNGS: clear to auscultation bilaterally, no wheezes, rales  or rhonchi, good air movement  CV: HRRR, no peripheral edema  MS: moves all extremities without noticeable abnormality  PSYCH: pleasant and cooperative, no obvious depression or anxiety  ASSESSMENT AND PLAN:  Discussed the following assessment and plan:  Acute upper respiratory infections of unspecified site - Plan: azithromycin (ZITHROMAX) 250 MG tablet, chlorpheniramine-HYDROcodone (TUSSIONEX PENNKINETIC ER) 10-8 MG/5ML LQCR  -likely viral but with PMH will do azithro and cough medication after discussion risks in case bacterial resp infection - return and emergent precautions discussed -Patient advised to return or notify a doctor immediately if symptoms worsen or persist or new concerns arise.  Patient  Instructions  -As we discussed, we have prescribed a new medication (AZITHROMYCIN and cough medication) for you at this appointment. We discussed the common and serious potential adverse effects of this medication and you can review these and more with the pharmacist when you pick up your medication.  Please follow the instructions for use carefully and notify us immediately if you have any problems taking this medication.  -do not take the cough medication and drive  -follow up with PCP in 2 weeks to recheck BP or sooner as neded      Lucretia Kern

## 2014-05-03 NOTE — Patient Instructions (Signed)
-  As we discussed, we have prescribed a new medication (AZITHROMYCIN and cough medication) for you at this appointment. We discussed the common and serious potential adverse effects of this medication and you can review these and more with the pharmacist when you pick up your medication.  Please follow the instructions for use carefully and notify us immediately if you have any problems taking this medication.  -do not take the cough medication and drive  -follow up with PCP in 2 weeks to recheck BP or sooner as neded

## 2014-05-03 NOTE — Telephone Encounter (Signed)
Patient Information:  Caller Name: Mariaeduarda  Phone: [phone]  Patient: [name]  Gender: Female  DOB: [birth year]  Age: 58 Years  PCP: Roxy Cedar [facility])  Pregnant: No  Office Follow Up:  Does the office need to follow up with this patient?: No  Instructions For The Office: N/A  RN Note:  Pt reports feeling light headed and chilled. Her BP was up while at work on 05/02/14 and is still elevated at 198/80. Denies chest pain, breathing easy.  Symptoms  Reason For Call & Symptoms: BP 198/80, chills  Reviewed Health History In EMR: Yes  Reviewed Medications In EMR: Yes  Reviewed Allergies In EMR: Yes  Reviewed Surgeries / Procedures: Yes  Date of Onset of Symptoms: 05/02/2014 OB / GYN:  LMP: Unknown  Guideline(s) Used:  High Blood Pressure  Disposition Per Guideline:   See Today in Office  Reason For Disposition Reached:   Patient wants to be seen  Advice Given:  Call Back If:  Headache, blurred vision, difficulty talking, or difficulty walking occurs  Chest pain or difficulty breathing occurs  You want to go in to the office for a blood pressure check  You become worse.  Patient Will Follow Care Advice:  YES  Appointment Scheduled:  05/03/2014 14:00:00 Appointment Scheduled Provider:  Maudie Mercury (TEXT 1st, after 20 mins can call), [facility])

## 2014-05-03 NOTE — Progress Notes (Signed)
Pre visit review using our clinic review tool, if applicable. No additional management support is needed unless otherwise documented below in the visit note. 

## 2014-05-03 NOTE — Telephone Encounter (Signed)
Noted  

## 2014-05-06 ENCOUNTER — Telehealth: Payer: Self-pay | Admitting: Internal Medicine

## 2014-05-06 NOTE — Telephone Encounter (Signed)
[id number] x1 

## 2014-05-10 ENCOUNTER — Ambulatory Visit (INDEPENDENT_AMBULATORY_CARE_PROVIDER_SITE_OTHER): Payer: BC Managed Care – PPO | Admitting: Family

## 2014-05-10 ENCOUNTER — Encounter: Payer: Self-pay | Admitting: Family

## 2014-05-10 VITALS — BP 156/90 | HR 82 | Temp 98.7°F | Ht 65.5 in | Wt 217.5 lb

## 2014-05-10 DIAGNOSIS — B977 Papillomavirus as the cause of diseases classified elsewhere: Secondary | ICD-10-CM

## 2014-05-10 DIAGNOSIS — I10 Essential (primary) hypertension: Secondary | ICD-10-CM

## 2014-05-10 DIAGNOSIS — R509 Fever, unspecified: Secondary | ICD-10-CM

## 2014-05-10 DIAGNOSIS — J3489 Other specified disorders of nose and nasal sinuses: Secondary | ICD-10-CM

## 2014-05-10 NOTE — Progress Notes (Signed)
Subjective:    Patient ID: [name], female    DOB: [birth year], 58 y.o.   MRN: [id number]  HPI 58 y.o. Black female presents today with chief complaint of " My HPV rash on my backside flares up, I get fever, chills and then I cant go to work". Pt request that she would like to know if the HPV is causing her to be sick all the time or if she is getting sick and it is causing the HPV. Pt also has a history of rectal cancer and received chemo and radiation therapy in 2005. Pt states that she is getting fevers, weakness, and chills at least once a month due to different infections. Denies change in appetite, denies SOB. She has seen ID in the past and been worked up with unknown etiology. It has been speculated that it may be long-term affects of chemotherapy.     Review of Systems  Constitutional: Positive for fever, chills and fatigue.       States she frequently gets fevers and chills.   HENT:       Reports nasal lesion.   Eyes: Negative.   Respiratory: Negative.   Cardiovascular: Negative.   Gastrointestinal: Negative.   Endocrine: Negative.   Musculoskeletal: Negative.   Skin:       Lesion to nose.   Allergic/Immunologic: Negative.   Neurological: Negative.   Psychiatric/Behavioral: Negative.        Objective:   Physical Exam  Constitutional: She is oriented to person, place, and time. She appears well-developed and well-nourished. She is active.  HENT:  Head: Normocephalic.  Nose: Sinus tenderness present.  Lesion to mid nose.   Cardiovascular: Normal rate, regular rhythm, normal heart sounds and normal pulses.   Pulmonary/Chest: Effort normal and breath sounds normal.  Abdominal: Soft. Normal appearance and bowel sounds are normal.  Genitourinary: Rectal exam shows tenderness.  HPV rash to buttocks.   Neurological: She is alert and oriented to person, place, and time.  Skin: Skin is warm, dry and intact.  Psychiatric: She has a normal mood and affect. Her speech  is normal and behavior is normal.     Past Medical History  Diagnosis Date  . Hypertension   . Pulmonary nodule, left   . Obesity   . Sarcoidosis   . GERD (gastroesophageal reflux disease)   . Rectal cancer   . Cleft lip     repaired   . Bifid uvula     History   Social History  . Marital Status: Single    Spouse Name: N/A    Number of Children: N/A  . 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 Smoking status: Former Smoker -- 0.50 packs/day for 1 years    Types: Cigarettes    Quit date: 12/16/1993  . Smokeless tobacco: Never Used     Comment: smoked breifly while in college  . Alcohol Use: No  . Drug Use: No  . Sexual Activity: Not on file   Other Topics Concern  . Not on file   Social History Narrative  . No narrative on file    No past surgical history on file.  Family History  Problem Relation Age of Onset  . Allergies Mother   . Allergies Sister   . Allergies Brother   . Asthma Mother   . Sarcoidosis Neg Hx   . Asthma Brother   . Asthma Sister  Allergies  Allergen Reactions  . Penicillins Hives    Current Outpatient Prescriptions on File Prior to Visit  Medication Sig Dispense Refill  . albuterol (PROAIR HFA) 108 (90 BASE) MCG/ACT inhaler Inhale 2 puffs into the lungs every 6 (six) hours as needed for wheezing or shortness of breath.      . Amlodipine-Valsartan-HCTZ (EXFORGE HCT) 10-320-25 MG TABS Take 1 tablet by mouth daily.  30 tablet  3  . metoprolol succinate (TOPROL-XL) 50 MG 24 hr tablet Take 1 tablet (50 mg total) by mouth daily. Take with or immediately following a meal.  30 tablet  3  . RABEprazole (ACIPHEX) 20 MG tablet Take 20 mg by mouth daily.      . Triamcinolone Acetonide (TRIAMCINOLONE 0.1 % CREAM : EUCERIN) CREA Apply 1 application topically 2 (two) times daily.  60 each  1  . azithromycin (ZITHROMAX) 250 MG tablet 2 tabs on the first day then 1 tab daily  6 tablet  0   . chlorpheniramine-HYDROcodone (TUSSIONEX PENNKINETIC ER) 10-8 MG/5ML LQCR Take 5 mLs by mouth every 12 (twelve) hours as needed.  115 mL  0  . famotidine (PEPCID) 20 MG tablet Take 20 mg by mouth at bedtime.      . IBUPROFEN PO Take 1 tablet by mouth every 4 (four) hours as needed (pain).      . Naproxen Sodium (ALEVE PO) Take 2 tablets by mouth daily as needed (pain).      . traMADol (ULTRAM) 50 MG tablet Take 50-100 mg by mouth every 6 (six) hours as needed for severe pain.       No current facility-administered medications on file prior to visit.    BP 156/90  Pulse 82  Temp(Src) 98.7 F (37.1 C) (Oral)  Ht 5' 5.5" (1.664 m)  Wt 217 lb 8 oz (98.657 kg)  BMI 35.63 kg/m2  SpO2 95%chart     Assessment & Plan:  [name] was seen today for hypertension.  Diagnoses and associated orders for this visit:  HPV (human papilloma virus) infection - Ambulatory referral to Infectious Disease - Wound culture  Unspecified essential hypertension - Wound culture  Nasal lesion - Wound culture  Other Orders - Wound culture; Standing - Wound culture   Education: keep lesions dry and clean.   Follow up: as needed pending infectious disease.

## 2014-05-10 NOTE — Progress Notes (Signed)
Pre visit review using our clinic review tool, if applicable. No additional management support is needed unless otherwise documented below in the visit note. 

## 2014-05-10 NOTE — Patient Instructions (Signed)
HPV Test The HPV (human papillomavirus) test is used to screen for high-risk types with HPV infection. HPV is a group of about 100 related viruses, of which 40 types are genital viruses. Most HPV viruses cause infections that usually resolve without treatment within 2 years. Some HPV infections can cause skin and genital warts (condylomata). HPV types 16, 18, 31 and 45 are considered high-risk types of HPV. High-risk types of HPV do not usually cause visible warts, but if untreated, may lead to cancers of the outlet of the womb (cervix) or anus. An HPV test identifies the DNA (genetic) strands of the HPV infection. Because the test identifies the DNA strands, the test is also referred to as the HPV DNA test. Although HPV is found in both males and females, the HPV test is only used to screen for cervical cancer in females. This test is recommended for females:  With an abnormal Pap test.  After treatment of an abnormal Pap test.  Aged 30 and older.  After treatment of a high-risk HPV infection. The HPV test may be done at the same time as a Pap test in females over the age of 30. Both the HPV and Pap test require a sample of cells from the cervix. PREPARATION FOR TEST  You may be asked to avoid douching, tampons, or vaginal medicines for 48 hours before the HPV test. You will be asked to urinate before the test. For the HPV test, you will need to lie on an exam table with your feet in stirrups. A spatula will be inserted into the vagina. The spatula will be used to swab the cervix for a cell and mucus sample. The sample will be further evaluated in a lab under a microscope. NORMAL FINDINGS  Normal: High-risk HPV is not found.  Ranges for normal findings may vary among different laboratories and hospitals. You should always check with your doctor after having lab work or other tests done to discuss the meaning of your test results and whether your values are considered within normal limits. MEANING  OF TEST An abnormal HPV test means that high-risk HPV is found. Your caregiver may recommend further testing. Your caregiver will go over the test results with you. He or she will and discuss the importance and meaning of your results, as well as treatment options and the need for additional tests, if necessary. OBTAINING THE RESULTS  It is your responsibility to obtain your test results. Ask the lab or department performing the test when and how you will get your results. Document Released: 12/27/2004 Document Revised: 02/24/2012 Document Reviewed: 09/11/2005 [employer] Patient Information 2014 [employer].  

## 2014-05-11 ENCOUNTER — Encounter: Payer: Self-pay | Admitting: Family

## 2014-05-11 NOTE — Telephone Encounter (Signed)
Spoke with patient-she states she did not need anything from our office and this issues was taken care of yesterday.

## 2014-05-12 ENCOUNTER — Telehealth: Payer: Self-pay | Admitting: Internal Medicine

## 2014-05-12 NOTE — Telephone Encounter (Signed)
Discussed with staff that patient would like be best suited to going to dermatology for evaluation of rash. Unclear what HPV rash would look like. Potentially misdiagnosed. Dermatology would be best to do repeat biopsy of affected area to confirm or/provide correct diagnosis of rash. ID visit without pathology and derm assessment would only be an additional delay in diagnosis.  Elzie Rings [facility] [phone]

## 2014-05-13 LAB — WOUND CULTURE
GRAM STAIN: NONE SEEN
Gram Stain: NONE SEEN
Gram Stain: NONE SEEN

## 2014-05-19 ENCOUNTER — Other Ambulatory Visit: Payer: Self-pay

## 2014-05-31 ENCOUNTER — Encounter: Payer: Self-pay | Admitting: Internal Medicine

## 2014-05-31 ENCOUNTER — Ambulatory Visit (INDEPENDENT_AMBULATORY_CARE_PROVIDER_SITE_OTHER): Payer: BC Managed Care – PPO | Admitting: Internal Medicine

## 2014-05-31 ENCOUNTER — Ambulatory Visit (INDEPENDENT_AMBULATORY_CARE_PROVIDER_SITE_OTHER)
Admission: RE | Admit: 2014-05-31 | Discharge: 2014-05-31 | Disposition: A | Discharge status: Home / Self Care | Payer: BC Managed Care – PPO | Source: Ambulatory Visit | Attending: Internal Medicine | Admitting: Internal Medicine

## 2014-05-31 ENCOUNTER — Other Ambulatory Visit (INDEPENDENT_AMBULATORY_CARE_PROVIDER_SITE_OTHER): Payer: BC Managed Care – PPO

## 2014-05-31 VITALS — BP 187/96 | HR 78 | Temp 97.9°F | Ht 65.0 in | Wt 216.0 lb

## 2014-05-31 VITALS — BP 164/90 | HR 82 | Temp 98.2°F | Ht 65.0 in | Wt 219.6 lb

## 2014-05-31 DIAGNOSIS — D869 Sarcoidosis, unspecified: Secondary | ICD-10-CM

## 2014-05-31 DIAGNOSIS — I10 Essential (primary) hypertension: Secondary | ICD-10-CM

## 2014-05-31 LAB — CBC WITH DIFFERENTIAL/PLATELET
BASOS ABS: 0 10*3/uL (ref 0.0–0.1)
BASOS PCT: 0.6 % (ref 0.0–3.0)
EOS ABS: 0.3 10*3/uL (ref 0.0–0.7)
Eosinophils Relative: 4.1 % (ref 0.0–5.0)
HCT: 34.8 % — ABNORMAL LOW (ref 36.0–46.0)
Hemoglobin: 12 g/dL (ref 12.0–15.0)
LYMPHS PCT: 17.7 % (ref 12.0–46.0)
Lymphs Abs: 1.4 10*3/uL (ref 0.7–4.0)
MCHC: 34.5 g/dL (ref 30.0–36.0)
MCV: 93.8 fl (ref 78.0–100.0)
Monocytes Absolute: 0.7 10*3/uL (ref 0.1–1.0)
Monocytes Relative: 9.1 % (ref 3.0–12.0)
NEUTROS PCT: 68.5 % (ref 43.0–77.0)
Neutro Abs: 5.2 10*3/uL (ref 1.4–7.7)
Platelets: 220 10*3/uL (ref 150.0–400.0)
RBC: 3.71 Mil/uL — AB (ref 3.87–5.11)
RDW: 13.7 % (ref 11.5–15.5)
WBC: 7.6 10*3/uL (ref 4.0–10.5)

## 2014-05-31 LAB — HEPATIC FUNCTION PANEL
ALBUMIN: 4.2 g/dL (ref 3.5–5.2)
ALT: 34 U/L (ref 0–35)
AST: 25 U/L (ref 0–37)
Alkaline Phosphatase: 119 U/L — ABNORMAL HIGH (ref 39–117)
Bilirubin, Direct: 0.1 mg/dL (ref 0.0–0.3)
Total Bilirubin: 0.7 mg/dL (ref 0.2–1.2)
Total Protein: 8.6 g/dL — ABNORMAL HIGH (ref 6.0–8.3)

## 2014-05-31 LAB — BASIC METABOLIC PANEL
BUN: 12 mg/dL (ref 6–23)
CO2: 29 mEq/L (ref 19–32)
CREATININE: 0.7 mg/dL (ref 0.4–1.2)
Calcium: 9.7 mg/dL (ref 8.4–10.5)
Chloride: 99 mEq/L (ref 96–112)
GFR: 107.96 mL/min (ref >60.00)
Glucose, Bld: 103 mg/dL — ABNORMAL HIGH (ref 70–99)
POTASSIUM: 3.6 meq/L (ref 3.5–5.1)
Sodium: 137 mEq/L (ref 135–145)

## 2014-05-31 LAB — TSH: TSH: 3.13 u[IU]/mL (ref 0.35–4.50)

## 2014-05-31 LAB — SEDIMENTATION RATE: SED RATE: 87 mm/h — AB (ref 0–22)

## 2014-05-31 NOTE — Progress Notes (Signed)
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  Last chronic prednisone 04/15/08 with no flare cough no nodules (right leg and left hand and right arm)  Last ov 02/17/13 no evidence active sarcoid rec Aciphex 20 mg Take 30-60 min before first meal of the day also when coughing for any reason take Pepcid 20 mg one at bedtime  Take delsym two tsp every 12 hours and supplement if needed with  tramadol 50 mg up to 2 every 4 hours to suppress the urge to cough. Swallowing water or using ice chips/non mint and menthol containing candies (such as lifesavers or sugarless jolly ranchers) are also effective.  You should rest your voice and avoid activities that you know make you cough. Once you have eliminated the cough for 3 straight days try reducing the tramadol first,  then the delsym as tolerated.   GERD diet        01/28/2014  Pulmonary consultation / Wert/ on no maint resp rx / rare baseline need for saba  Chief Complaint  Patient presents with  . Pulmonary Consult    Referred per Dr. Posey Pronto. [name] Cough is prod with moderate yellow sputum. She is using proair approx tid.  Onset was Jan 01 2014 while on aciphex with bfast with flu like illness but not follow contingency rx  On prednisone taper/ sp tamiflu/cough meds  Not limited by breathing from desired activities   rec Aciphex 20 mg Take 30-60 min before first meal of the day also when coughing for any reason take Pepcid 20 mg one at  bedtime and chlortrimeton 4 mg at bedtime Take delsym two tsp every 12 hours and supplement if needed with  tramadol 50 mg up to 2 every 4 hours to suppress the urge to cough. Swallowing water or using ice chips/non mint and menthol containing candies (such as lifesavers or sugarless jolly ranchers) are also effective.  You should rest your voice and avoid activities that you know make you cough. Once you have eliminated the cough for 3 straight days try reducing the tramadol first,  then the delsym as tolerated.   GERD diet  F/u prn     05/31/2014 f/u ov/Wert re: sarcoid Chief Complaint  Patient presents with  . Acute Visit    Pt c/o wheezing on and off since last visit. She states that she had a bx done on a spot on her nose that came back c/w sarcoid.      Symptoms are intermittent, acute, not chronic, with the only ongoing problem being skin involvement. She typically sees other care providers with dx of uri/uti and seems to respond to abx s steroids. Presently not limited by sob and no cough  No obvious patterns in day to day or daytime variabilty of symptoms or assoc chronic cough or cp or chest tightness, subjective wheeze overt sinus or hb symptoms. No unusual exp hx or h/o childhood pna/ asthma or knowledge of premature birth.  Sleeping ok without nocturnal  or early  am exacerbation  of respiratory  c/o's or need for noct saba. Also denies any obvious fluctuation of symptoms with weather or environmental changes or other aggravating or alleviating factors except as outlined above   Current Medications, Allergies, Complete Past Medical History, Past Surgical History, Family History, and Social History were reviewed in [employer] record.  ROS  The following are not active complaints unless bolded sore throat, dysphagia, dental problems, itching, sneezing,  nasal congestion or excess/ purulent secretions, ear ache,   fever, chills, sweats, unintended wt loss,  pleuritic or exertional cp, hemoptysis,  orthopnea pnd or leg swelling, presyncope, palpitations, heartburn, abdominal pain, anorexia, nausea, vomiting, diarrhea  or change in bowel or urinary habits, change in stools or urine, dysuria,hematuria,  rash, arthralgias, visual complaints, headache, numbness weakness or ataxia or problems with walking or coordination,  change in mood/affect or memory.                  Objective:   Physical Exam  05/31/2014      220  Wt Readings from Last 3 Encounters:  01/28/14 210 lb 12.8 oz (95.618 kg)  01/24/14 213 lb (96.616 kg)  01/10/14 208 lb (94.348 kg)     amb bf belle affect/ nad  HEENT: nl dentition, turbinates, and orophanx. Nl external ear canals without cough reflex   NECK :  without JVD/Nodes/TM/ nl carotid upstrokes bilaterally   LUNGS: no acc muscle use, clear to A and P bilaterally without cough on insp or exp maneuvers   CV:  RRR  no s3 or murmur or increase in P2, no edema   ABD:  soft and nontender with nl excursion in the supine position. No bruits or organomegaly, bowel sounds nl  MS:  warm without deformities, calf tenderness, cyanosis or clubbing  SKIN: warm and dry without lesions  - minimal sarcoid changes at junction of nose and   upper lip  NEURO:  alert, approp, no deficits    cxr 05/31/14 1. There is a stable appearance of the pulmonary interstitium and  hilar regions consistent with the patient's known sarcoidosis.  2. There has been interval enlargement of the cardiac silhouette.  There is no pulmonary vascular congestion.    Recent Labs Lab 05/31/14 1132  NA 137  K 3.6  CL 99  CO2 29  BUN 12  CREATININE 0.7  GLUCOSE 103*    Recent Labs Lab 05/31/14 1132  HGB 12.0  HCT 34.8*  WBC 7.6  PLT 220.0     Lab Results  Component Value Date   ALT 34 05/31/2014   AST 25 05/31/2014   ALKPHOS 119* 05/31/2014   BILITOT 0.7 05/31/2014     Lab Results  Component Value Date   ESRSEDRATE 87*  05/31/2014   ESRSEDRATE 69* 10/01/2011   ESRSEDRATE 56* 12/26/2008        Assessment & Plan:

## 2014-05-31 NOTE — Progress Notes (Signed)
   Subjective:    Patient ID: [name], female    DOB: 1956/11/06, 58 y.o.   MRN: [id number]  HPI Here for a new patient visit.  She has a history of anal cancer treated with 5-FU and mitomycin C in 2005.  Also a history of Sarcoidosis diagnosed in 1996 and followed by Dr. Melvyn Novas.  She comes in with a complaint of periodic fever and chills that occur monthly.  She had a lesion on her nose that was biopsied and c/w sarcoid (done by Dr. Allyson Sabal).  Has episodes of fever and chills about monthly.  Gets treated for presumed pneumonia and other diagnoses and given antibiotics fairly regularly.  No weight loss.      Review of Systems  Constitutional: Positive for fever and chills.  Respiratory: Positive for cough.   Gastrointestinal: Negative for nausea and diarrhea.  Neurological: Negative for dizziness and light-headedness.  Hematological: Negative for adenopathy.       Objective:   Physical Exam  Constitutional: She appears well-developed and well-nourished. No distress.  HENT:  Mouth/Throat: No oropharyngeal exudate.  Eyes: No scleral icterus.  Neurological: She is alert.  Skin: No rash noted.  Psychiatric: She has a normal mood and affect.          Assessment & Plan:

## 2014-05-31 NOTE — Patient Instructions (Signed)
Please remember to go to the lab and x-ray department downstairs for your tests - we will call you with the results when they are available.      

## 2014-05-31 NOTE — Assessment & Plan Note (Signed)
Biopsy c/w sarcoidosis.  Also in her eye.  Has not been on steroids recently. Recent "pneumonias" likely non-infectious.  I will defer treatment to Dr. Melvyn Novas.  She can return PRN.  30 minutes spent in review of record and 15 mintues spent in counseling on sarcoidosis and need to limit antibiotics unless indicated.

## 2014-06-01 NOTE — Assessment & Plan Note (Signed)
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 I have asked her to make an appt to see me in the event of any further febrile illnesses, worse rash, cough, sob, arthralgias so that one health care provider can sort out what's sarcoid and what's not  If she does get recurrrent or persisitent skin involvement would be a good candidate for trial of plaquenil but at this point no steroids needed.  See instructions for specific recommendations which were reviewed directly with the patient who was given a copy with highlighter outlining the key components.   [employer] forms completed for possible work absences up to twice monthly for ov's in future

## 2014-06-01 NOTE — Assessment & Plan Note (Addendum)
Adequate control on present rx, though concerned about reported "wheezing" on lopressor 50 mg qd which probably ok but prefer in this setting: Bystolic, the most beta -1  selective Beta blocker available in sample form, with bisoprolol the most selective generic choice  on the market.  Will defer final call though to primary care

## 2014-06-03 ENCOUNTER — Telehealth: Payer: Self-pay | Admitting: Family

## 2014-06-03 NOTE — Telephone Encounter (Signed)
An  appt was made for pt in march for a 3 mo fup on 6/23. Since then pt has been in several times and would like to know if she needs to fu this soon?  If not, when? Also, pt states she had some test done and would like results.  She doesn't mind coming in next week if you need her to. pls advise.

## 2014-06-03 NOTE — Telephone Encounter (Signed)
Take benadryl 25 mg. If no symptom relief, go to urgent care or ED. SOB or wheezing, go to ED

## 2014-06-03 NOTE — Telephone Encounter (Signed)
Pt aware and states that she has already gotten benadryl and is taking it

## 2014-06-03 NOTE — Telephone Encounter (Signed)
An appt was made for pt in march for a 3 mo fup on 6/23.  Since then pt has been in several times and would like to know if she needs to fu this soon? If not, when?  Also, pt states she had some test done and would like results. She doesn't mind coming in next week if you need her to. pls advise.

## 2014-06-03 NOTE — Telephone Encounter (Signed)
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 Ate tunafish and tea with fresh lemon juice 06/02/14 PM.  Suspects lemon may have cause the lip edema.  Declined to be seen sooner; already has appointment for 06/07/14.  Symptoms  Reason For Call & Symptoms: Swollen upper lip.  Lip was itching 06/02/14 then edematous 06/03/14.  Reviewed Health History In EMR: Yes  Reviewed Medications In EMR: Yes  Reviewed Allergies In EMR: Yes  Reviewed Surgeries / Procedures: Yes  Date of Onset of Symptoms: 06/03/2014  Treatments Tried: applied ice, Neosporin and vaseline  Treatments Tried Worked: Yes OB / GYN:  LMP: Unknown  Guideline(s) Used:  Face Swelling  Disposition Per Guideline:   See Within 3 Days in Office  Reason For Disposition Reached:   Mild facial swelling from food reaction and diagnosis never confirmed by a physician  Advice Given:  Reassurance:  It sounds like a mild food reaction without any symptoms of a severe allergic reaction. Face swelling from foods is usually gone in less than 24 hours.  Here is some care advice that should help.  Remove Allergen:  Wash the face with soap and water to remove any traces of food.  Prevention:  In the future, avoid any food you think caused the face swelling.  Antihistamine Medication for Facial Swelling:  Sometimes antihistamine medications are helpful in reducing swelling from a food or allergic reaction. You can take diphenhydramine (e.g., Benadryl). The adult dosage 25-50 mg by mouth every 6 hours on an as needed basis.  Loratadine is a newer (second generation) antihistamine. The dosage of loratadine (e.g., OTC Claritin,  Alavert) is 10 mg once a day.  Cetirizine is a newer (second generation) antihistamine. The dosage of cetirizine (e.g., OTC Zyrtec) is 10 mg once a day.  CAUTION: Antihistamines may cause sleepiness. Do not drink, drive, or operate dangerous machinery while taking antihistamines. Do not take antihistamines if you have prostate problems.  Loratadine and cetirizine cause less sleepiness than diphenhydramine (Benadryl) or Chlorpheniramine (Chlortrimeton, Chlor-tripolon).  Read the package instructions thoroughly on all medications that you take.  Call Back If  Difficulty breathing or difficulty swallowing occurs  Swelling lasts over 3 days  You become worse.  RN Overrode Recommendation:  Patient Already Has Appt, Document Patient  Already scheduled for 06/07/14

## 2014-06-03 NOTE — Telephone Encounter (Signed)
Pls advise.  

## 2014-06-03 NOTE — Telephone Encounter (Signed)
Note aciedntly closed. New note opened and resent

## 2014-06-03 NOTE — Telephone Encounter (Signed)
We can cancel appointment for next week and schedule an appointment for 4 months out. Her labs were ordered by Dr. Melvyn Novas and it looks like they have been discussed with her. The wound culture from May was normal. Not sure what other labs she would be referring to

## 2014-06-07 ENCOUNTER — Ambulatory Visit: Payer: BC Managed Care – PPO | Admitting: Family

## 2014-06-08 ENCOUNTER — Other Ambulatory Visit: Payer: Self-pay

## 2014-06-08 MED ORDER — METHYLPREDNISOLONE (PAK) 4 MG PO TABS: ORAL_TABLET | ORAL | Status: DC

## 2014-06-09 ENCOUNTER — Ambulatory Visit: Payer: BC Managed Care – PPO | Admitting: Family

## 2014-07-06 NOTE — Telephone Encounter (Signed)
appt scheduled

## 2014-09-13 ENCOUNTER — Ambulatory Visit (INDEPENDENT_AMBULATORY_CARE_PROVIDER_SITE_OTHER): Payer: BC Managed Care – PPO | Admitting: Family

## 2014-09-13 ENCOUNTER — Encounter: Payer: Self-pay | Admitting: Family

## 2014-09-13 VITALS — BP 130/84 | Temp 98.2°F | Wt 213.7 lb

## 2014-09-13 DIAGNOSIS — K2100 Gastro-esophageal reflux disease with esophagitis, without bleeding: Secondary | ICD-10-CM

## 2014-09-13 DIAGNOSIS — D869 Sarcoidosis, unspecified: Secondary | ICD-10-CM

## 2014-09-13 DIAGNOSIS — E669 Obesity, unspecified: Secondary | ICD-10-CM

## 2014-09-13 DIAGNOSIS — I10 Essential (primary) hypertension: Secondary | ICD-10-CM

## 2014-09-13 MED ORDER — PHENTERMINE HCL 37.5 MG PO CAPS: 37.5000 mg | ORAL_CAPSULE | ORAL | Status: DC

## 2014-09-13 MED ORDER — TRIAMCINOLONE 0.1 % CREAM:EUCERIN CREAM 1:1: 1.0000 "application " | TOPICAL_CREAM | Freq: Two times a day (BID) | CUTANEOUS | Status: DC

## 2014-09-13 NOTE — Patient Instructions (Signed)
Exercise to Stay Healthy Exercise helps you become and stay healthy. EXERCISE IDEAS AND TIPS Choose exercises that:  You enjoy.  Fit into your day. You do not need to exercise really hard to be healthy. You can do exercises at a slow or medium level and stay healthy. You can:  Stretch before and after working out.  Try yoga, Pilates, or tai chi.  Lift weights.  Walk fast, swim, jog, run, climb stairs, bicycle, dance, or rollerskate.  Take aerobic classes. Exercises that burn about 150 calories:  Running 1  miles in 15 minutes.  Playing volleyball for 45 to 60 minutes.  Washing and waxing a car for 45 to 60 minutes.  Playing touch football for 45 minutes.  Walking 1  miles in 35 minutes.  Pushing a stroller 1  miles in 30 minutes.  Playing basketball for 30 minutes.  Raking leaves for 30 minutes.  Bicycling 5 miles in 30 minutes.  Walking 2 miles in 30 minutes.  Dancing for 30 minutes.  Shoveling snow for 15 minutes.  Swimming laps for 20 minutes.  Walking up stairs for 15 minutes.  Bicycling 4 miles in 15 minutes.  Gardening for 30 to 45 minutes.  Jumping rope for 15 minutes.  Washing windows or floors for 45 to 60 minutes. Document Released: 01/04/2011 Document Revised: 02/24/2012 Document Reviewed: 01/04/2011 [employer] Patient Information 2015 [employer]. This information is not intended to replace advice given to you by your health care provider. Make sure you discuss any questions you have with your health care provider.  

## 2014-09-13 NOTE — Progress Notes (Signed)
Pre visit review using our clinic review tool, if applicable. No additional management support is needed unless otherwise documented below in the visit note. 

## 2014-09-14 ENCOUNTER — Encounter: Payer: Self-pay | Admitting: Family

## 2014-09-14 NOTE — Progress Notes (Signed)
Subjective:    Patient ID: Monnie Gudgel, female    DOB: 26-Jan-1956, 58 y.o.   MRN: [id number]  HPI 58 year old AAF, nonsmoker, in for a recheck of Hypertension. Obesity, GERD, Sarcoidosis. She is currently stable on medications. Exercising 3 times a week and weight is decreasing. Requesting a 1 month trial of Phentermine.    Review of Systems  Constitutional: Negative.   HENT: Negative.   Respiratory: Negative.   Cardiovascular: Negative.   Gastrointestinal: Negative.   Endocrine: Negative.   Genitourinary: Negative.   Musculoskeletal: Negative.   Skin: Negative.   Neurological: Negative.   Hematological: Negative.   Psychiatric/Behavioral: Negative.    Past Medical History  Diagnosis Date  . Hypertension   . Pulmonary nodule, left   . Obesity   . Sarcoidosis   . GERD (gastroesophageal reflux disease)   . Rectal cancer   . Cleft lip     repaired   . Bifid uvula     History   Social History  . Marital Status: Single    Spouse Name: N/A    Number of Children: N/A  . 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 Smoking status: Former Smoker -- 0.50 packs/day for 1 years    Types: Cigarettes    Quit date: 12/16/1993  . Smokeless tobacco: Never Used     Comment: smoked breifly while in college  . Alcohol Use: Yes  . Drug Use: No  . Sexual Activity: Not on file   Other Topics Concern  . Not on file   Social History Narrative  . No narrative on file    History reviewed. No pertinent past surgical history.  Family History  Problem Relation Age of Onset  . Allergies Mother   . Allergies Sister   . Allergies Brother   . Asthma Mother   . Sarcoidosis Neg Hx   . Asthma Brother   . Asthma Sister     Allergies  Allergen Reactions  . Penicillins Hives    Current Outpatient Prescriptions on File Prior to Visit  Medication Sig Dispense Refill  . albuterol (PROAIR HFA) 108 (90 BASE) MCG/ACT  inhaler Inhale 2 puffs into the lungs every 6 (six) hours as needed for wheezing or shortness of breath.      . Amlodipine-Valsartan-HCTZ (EXFORGE HCT) 10-320-25 MG TABS Take 1 tablet by mouth daily.  30 tablet  3  . chlorpheniramine-HYDROcodone (TUSSIONEX PENNKINETIC ER) 10-8 MG/5ML LQCR Take 5 mLs by mouth every 12 (twelve) hours as needed.  115 mL  0  . famotidine (PEPCID) 20 MG tablet Take 20 mg by mouth at bedtime.      . methylPREDNIsolone (MEDROL DOSPACK) 4 MG tablet follow package directions  21 tablet  0  . metoprolol succinate (TOPROL-XL) 50 MG 24 hr tablet Take 1 tablet (50 mg total) by mouth daily. Take with or immediately following a meal.  30 tablet  3  . Naphazoline-Pheniramine (EYE ALLERGY RELIEF) 0.027-0.315 % SOLN Apply 1 drop to eye.      . Naproxen Sodium (ALEVE PO) Take 2 tablets by mouth daily as needed (pain).      . RABEprazole (ACIPHEX) 20 MG tablet Take 20 mg by mouth daily.      . traMADol (ULTRAM) 50 MG tablet Take 50-100 mg by mouth every 6 (six) hours as needed for severe pain.       No current facility-administered medications on  file prior to visit.    BP 130/84  Temp(Src) 98.2 F (36.8 C) (Oral)  Wt 213 lb 11.2 oz (96.934 kg)chart    Objective:   Physical Exam  Constitutional: She is oriented to person, place, and time. She appears well-developed and well-nourished.  HENT:  Right Ear: External ear normal.  Left Ear: External ear normal.  Nose: Nose normal.  Mouth/Throat: Oropharynx is clear and moist.  Neck: Neck supple.  Cardiovascular: Normal rate, regular rhythm and normal heart sounds.   Pulmonary/Chest: Effort normal and breath sounds normal.  Abdominal: Soft. Bowel sounds are normal.  Musculoskeletal: Normal range of motion.  Neurological: She is alert and oriented to person, place, and time.  Skin: Skin is warm and dry.  Psychiatric: She has a normal mood and affect.          Assessment & Plan:  Lynnzie was seen today for 4 month  rov.  Diagnoses and associated orders for this visit:  Unspecified essential hypertension  Obesity, unspecified  Sarcoidosis  Gastroesophageal reflux disease with esophagitis  Other Orders - Triamcinolone Acetonide (TRIAMCINOLONE 0.1 % CREAM : EUCERIN) CREA; Apply 1 application topically 2 (two) times daily. - phentermine 37.5 MG capsule; Take 1 capsule (37.5 mg total) by mouth every morning.   Patient verbalizes understanding the possible adverse effects of stimulant medications to include elevated blood pressure. Recheck in 1 month.

## 2014-10-05 ENCOUNTER — Ambulatory Visit (INDEPENDENT_AMBULATORY_CARE_PROVIDER_SITE_OTHER): Payer: BC Managed Care – PPO | Admitting: Family

## 2014-10-05 ENCOUNTER — Encounter: Payer: Self-pay | Admitting: Family

## 2014-10-05 VITALS — BP 200/100 | HR 84 | Temp 98.3°F | Wt 196.0 lb

## 2014-10-05 DIAGNOSIS — R519 Headache, unspecified: Secondary | ICD-10-CM

## 2014-10-05 DIAGNOSIS — I10 Essential (primary) hypertension: Secondary | ICD-10-CM

## 2014-10-05 DIAGNOSIS — R51 Headache: Secondary | ICD-10-CM

## 2014-10-05 MED ORDER — CLONIDINE HCL 0.1 MG PO TABS: 0.2000 mg | ORAL_TABLET | Freq: Once | ORAL | Status: DC

## 2014-10-05 MED ORDER — NAPROXEN 500 MG PO TABS: 500.0000 mg | ORAL_TABLET | Freq: Two times a day (BID) | ORAL | Status: DC

## 2014-10-05 NOTE — Patient Instructions (Addendum)
1. STOP DECONGESTANTS 2. May obtain Coricidin HBP OTC and use as needed.   General Headache Without Cause A headache is pain or discomfort felt around the head or neck area. The specific cause of a headache may not be found. There are many causes and types of headaches. A few common ones are:  Tension headaches.  Migraine headaches.  Cluster headaches.  Chronic daily headaches. HOME CARE INSTRUCTIONS   Keep all follow-up appointments with your caregiver or any specialist referral.  Only take over-the-counter or prescription medicines for pain or discomfort as directed by your caregiver.  Lie down in a dark, quiet room when you have a headache.  Keep a headache journal to find out what may trigger your migraine headaches. For example, write down:  What you eat and drink.  How much sleep you get.  Any change to your diet or medicines.  Try massage or other relaxation techniques.  Put ice packs or heat on the head and neck. Use these 3 to 4 times per day for 15 to 20 minutes each time, or as needed.  Limit stress.  Sit up straight, and do not tense your muscles.  Quit smoking if you smoke.  Limit alcohol use.  Decrease the amount of caffeine you drink, or stop drinking caffeine.  Eat and sleep on a regular schedule.  Get 7 to 9 hours of sleep, or as recommended by your caregiver.  Keep lights dim if bright lights bother you and make your headaches worse. SEEK MEDICAL CARE IF:   You have problems with the medicines you were prescribed.  Your medicines are not working.  You have a change from the usual headache.  You have nausea or vomiting. SEEK IMMEDIATE MEDICAL CARE IF:   Your headache becomes severe.  You have a fever.  You have a stiff neck.  You have loss of vision.  You have muscular weakness or loss of muscle control.  You start losing your balance or have trouble walking.  You feel faint or pass out.  You have severe symptoms that are  different from your first symptoms. MAKE SURE YOU:   Understand these instructions.  Will watch your condition.  Will get help right away if you are not doing well or get worse. Document Released: 12/02/2005 Document Revised: 02/24/2012 Document Reviewed: 12/18/2011 [facility] Patient Information 2015 [place], [place]. This information is not intended to replace advice given to you by your health care provider. Make sure you discuss any questions you have with your health care provider.

## 2014-10-05 NOTE — Progress Notes (Signed)
Pre visit review using our clinic review tool, if applicable. No additional management support is needed unless otherwise documented below in the visit note. 

## 2014-10-06 ENCOUNTER — Ambulatory Visit: Payer: BC Managed Care – PPO | Admitting: Family

## 2014-10-06 NOTE — Progress Notes (Signed)
Subjective:    Patient ID: [name], female    DOB: [birth year], 58 y.o.   MRN: [id number]  HPI 58 year old African American female, nonsmoker presents today with complaints of frontal headache pain x4 days that she describes as a pressure sensation. Took allergy med for relief last night that helped some. Reports a low-grade temperature of 99.1. Has had sneezing but denies any cough or congestion. Reports more fatigue. Denies blurred vision or double vision. Denies any history of headaches in the past. Has a history of hypertension. Isn't sure if the medication she took with a decongestant.   Review of Systems  Constitutional: Positive for fatigue. Negative for chills and appetite change.  HENT: Positive for sneezing. Negative for congestion.   Respiratory: Negative.  Negative for cough.   Cardiovascular: Negative.   Gastrointestinal: Negative.   Genitourinary: Negative.   Musculoskeletal: Negative.   Skin: Negative.   Allergic/Immunologic: Negative.   Neurological: Positive for headaches.  Hematological: Negative.   Psychiatric/Behavioral: Negative.   All other systems reviewed and are negative.  Past Medical History  Diagnosis Date  . Hypertension   . Pulmonary nodule, left   . Obesity   . Sarcoidosis   . GERD (gastroesophageal reflux disease)   . Rectal cancer   . Cleft lip     repaired   . Bifid uvula     History   Social History  . Marital Status: Single    Spouse Name: N/A    Number of Children: N/A  . 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 Smoking status: Former Smoker -- 0.50 packs/day for 1 years    Types: Cigarettes    Quit date: 12/16/1993  . Smokeless tobacco: Never Used     Comment: smoked breifly while in college  . Alcohol Use: Yes  . Drug Use: No  . Sexual Activity: Not on file   Other Topics Concern  . Not on file   Social History Narrative  . No narrative on file     History reviewed. No pertinent past surgical history.  Family History  Problem Relation Age of Onset  . Allergies Mother   . Allergies Sister   . Allergies Brother   . Asthma Mother   . Sarcoidosis Neg Hx   . Asthma Brother   . Asthma Sister     Allergies  Allergen Reactions  . Penicillins Hives    Current Outpatient Prescriptions on File Prior to Visit  Medication Sig Dispense Refill  . albuterol (PROAIR HFA) 108 (90 BASE) MCG/ACT inhaler Inhale 2 puffs into the lungs every 6 (six) hours as needed for wheezing or shortness of breath.      . Amlodipine-Valsartan-HCTZ (EXFORGE HCT) 10-320-25 MG TABS Take 1 tablet by mouth daily.  30 tablet  3  . famotidine (PEPCID) 20 MG tablet Take 20 mg by mouth at bedtime.      . metoprolol succinate (TOPROL-XL) 50 MG 24 hr tablet Take 1 tablet (50 mg total) by mouth daily. Take with or immediately following a meal.  30 tablet  3  . Naphazoline-Pheniramine (EYE ALLERGY RELIEF) 0.027-0.315 % SOLN Apply 1 drop to eye.      . Naproxen Sodium (ALEVE PO) Take 2 tablets by mouth daily as needed (pain).      . RABEprazole (ACIPHEX) 20 MG tablet Take 20 mg by mouth daily.      . Triamcinolone Acetonide (TRIAMCINOLONE 0.1 %  CREAM : EUCERIN) CREA Apply 1 application topically 2 (two) times daily.  60 each  1   No current facility-administered medications on file prior to visit.    BP 200/100  Pulse 84  Temp(Src) 98.3 F (36.8 C) (Oral)  Wt 196 lb (88.905 kg)chart    Objective:   Physical Exam  Constitutional: She is oriented to person, place, and time. She appears well-developed and well-nourished.  HENT:  Right Ear: External ear normal.  Left Ear: External ear normal.  Nose: Nose normal.  Mouth/Throat: Oropharynx is clear and moist.  Tenderness to palpation of the frontal sinuses.  Eyes: Conjunctivae are normal. Pupils are equal, round, and reactive to light.  Neck: Normal range of motion. Neck supple.  Cardiovascular: Normal rate,  regular rhythm and normal heart sounds.   Blood pressure rechecked 190/86. 2 clonidine 0.1 mg tablets given. Blood pressure rechecked 160/80 20 minutes later  Pulmonary/Chest: Effort normal and breath sounds normal.  Abdominal: Soft. Bowel sounds are normal.  Musculoskeletal: Normal range of motion.  Neurological: She is alert and oriented to person, place, and time. She has normal reflexes. No cranial nerve deficit.  Skin: Skin is warm and dry.  Psychiatric: She has a normal mood and affect.          Assessment & Plan:  Hong was seen today for facial pain.  Diagnoses and associated orders for this visit:  Uncontrolled hypertension - cloNIDine (CATAPRES) tablet 0.2 mg; Take 2 tablets (0.2 mg total) by mouth once.  Intractable headache, unspecified chronicity pattern, unspecified headache type - cloNIDine (CATAPRES) tablet 0.2 mg; Take 2 tablets (0.2 mg total) by mouth once.  Other Orders - naproxen (NAPROSYN) 500 MG tablet; Take 1 tablet (500 mg total) by mouth 2 (two) times daily with a meal.    call the office with any questions or concerns. I believe the headache is coming from a tension headache not sinusitis. Naproxen provided today. Clonidine provided during the office visit and blood pressure was reduced. Avoid decongestants. Do not take phentermine.

## 2014-10-11 ENCOUNTER — Ambulatory Visit (INDEPENDENT_AMBULATORY_CARE_PROVIDER_SITE_OTHER): Payer: BC Managed Care – PPO | Admitting: Family

## 2014-10-11 ENCOUNTER — Encounter: Payer: Self-pay | Admitting: Family

## 2014-10-11 VITALS — BP 160/98 | HR 81 | Wt 210.9 lb

## 2014-10-11 DIAGNOSIS — I10 Essential (primary) hypertension: Secondary | ICD-10-CM

## 2014-10-11 DIAGNOSIS — E669 Obesity, unspecified: Secondary | ICD-10-CM

## 2014-10-11 DIAGNOSIS — G44209 Tension-type headache, unspecified, not intractable: Secondary | ICD-10-CM

## 2014-10-11 MED ORDER — METOPROLOL SUCCINATE ER 100 MG PO TB24: 100.0000 mg | ORAL_TABLET | Freq: Every day | ORAL | Status: DC

## 2014-10-11 NOTE — Patient Instructions (Signed)

## 2014-10-11 NOTE — Progress Notes (Signed)
Pre visit review using our clinic review tool, if applicable. No additional management support is needed unless otherwise documented below in the visit note. 

## 2014-10-12 NOTE — Progress Notes (Signed)
Subjective:    Patient ID: [name], female    DOB: [birth year], 58 y.o.   MRN: [id number]  HPI  58 year old African-American female, nonsmoker with a history of hypertension was seen last week with uncontrolled hypertension,and headache and  with a systolic blood pressure in the 200s. At that time, she had been taken a decongestant for sinus issues. She discontinued taking the medication. She had also recently discontinued taken phentermine to help as an appetite suppressant. Her blood pressure today is improved but continues to run in the 400Q systolically. She. Much better today. Weight is still unchanged.   Review of Systems  Constitutional: Negative.   HENT: Negative.   Respiratory: Negative.   Cardiovascular: Negative.   Gastrointestinal: Negative.   Endocrine: Negative.   Genitourinary: Negative.   Musculoskeletal: Negative.   Skin: Negative.   Allergic/Immunologic: Negative.   Neurological: Negative.   Hematological: Negative.   Psychiatric/Behavioral: Negative.    Past Medical History  Diagnosis Date  . Hypertension   . Pulmonary nodule, left   . Obesity   . Sarcoidosis   . GERD (gastroesophageal reflux disease)   . Rectal cancer   . Cleft lip     repaired   . Bifid uvula     History   Social History  . Marital Status: Single    Spouse Name: N/A    Number of Children: N/A  . 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 Smoking status: Former Smoker -- 0.50 packs/day for 1 years    Types: Cigarettes    Quit date: 12/16/1993  . Smokeless tobacco: Never Used     Comment: smoked breifly while in college  . Alcohol Use: Yes  . Drug Use: No  . Sexual Activity: Not on file   Other Topics Concern  . Not on file   Social History Narrative  . No narrative on file    History reviewed. No pertinent past surgical history.  Family History  Problem Relation Age of Onset  . Allergies Mother   .  Allergies Sister   . Allergies Brother   . Asthma Mother   . Sarcoidosis Neg Hx   . Asthma Brother   . Asthma Sister     Allergies  Allergen Reactions  . Penicillins Hives    Current Outpatient Prescriptions on File Prior to Visit  Medication Sig Dispense Refill  . albuterol (PROAIR HFA) 108 (90 BASE) MCG/ACT inhaler Inhale 2 puffs into the lungs every 6 (six) hours as needed for wheezing or shortness of breath.      . Amlodipine-Valsartan-HCTZ (EXFORGE HCT) 10-320-25 MG TABS Take 1 tablet by mouth daily.  30 tablet  3  . famotidine (PEPCID) 20 MG tablet Take 20 mg by mouth at bedtime.      Mable Fill (EYE ALLERGY RELIEF) 0.027-0.315 % SOLN Apply 1 drop to eye.      . naproxen (NAPROSYN) 500 MG tablet Take 1 tablet (500 mg total) by mouth 2 (two) times daily with a meal.  30 tablet  0  . Naproxen Sodium (ALEVE PO) Take 2 tablets by mouth daily as needed (pain).      . RABEprazole (ACIPHEX) 20 MG tablet Take 20 mg by mouth daily.      . Triamcinolone Acetonide (TRIAMCINOLONE 0.1 % CREAM : EUCERIN) CREA Apply 1 application topically 2 (two) times daily.  60 each  1   No current facility-administered medications  on file prior to visit.    BP 160/98  Pulse 81  Wt 210 lb 14.4 oz (95.664 kg)chart    Objective:   Physical Exam  Constitutional: She is oriented to person, place, and time. She appears well-developed and well-nourished.  HENT:  Right Ear: External ear normal.  Left Ear: External ear normal.  Nose: Nose normal.  Mouth/Throat: Oropharynx is clear and moist.  Neck: Normal range of motion. Neck supple. No thyromegaly present.  Cardiovascular: Normal rate and regular rhythm.   Pulmonary/Chest: Effort normal and breath sounds normal.  Abdominal: Soft. Bowel sounds are normal.  Musculoskeletal: Normal range of motion.  Neurological: She is alert and oriented to person, place, and time.  Skin: Skin is warm and dry.  Psychiatric: She has a normal mood and  affect.          Assessment & Plan:  Courtland was seen today for follow-up.  Diagnoses and associated orders for this visit:  Uncontrolled hypertension  Obesity  Tension-type headache, not intractable, unspecified chronicity pattern  Other Orders - metoprolol succinate (TOPROL-XL) 100 MG 24 hr tablet; Take 1 tablet (100 mg total) by mouth daily. Take with or immediately following a meal.    advised patient that she is not to take decongestants and is no longer a candidate for stimulant appetite suppressant. Patient verbalizes understanding. Increase Toprol to 100 mg once daily. Recheck in 3 week

## 2014-10-21 ENCOUNTER — Other Ambulatory Visit: Payer: Self-pay | Admitting: Obstetrics and Gynecology

## 2014-10-24 LAB — CYTOLOGY - PAP

## 2014-10-25 ENCOUNTER — Other Ambulatory Visit: Payer: Self-pay | Admitting: Obstetrics and Gynecology

## 2014-10-25 DIAGNOSIS — R921 Mammographic calcification found on diagnostic imaging of breast: Secondary | ICD-10-CM

## 2014-10-26 ENCOUNTER — Other Ambulatory Visit: Payer: Self-pay | Admitting: Obstetrics and Gynecology

## 2014-10-26 DIAGNOSIS — R921 Mammographic calcification found on diagnostic imaging of breast: Secondary | ICD-10-CM

## 2014-11-07 ENCOUNTER — Ambulatory Visit
Admission: RE | Admit: 2014-11-07 | Discharge: 2014-11-07 | Disposition: A | Discharge status: Home / Self Care | Payer: BC Managed Care – PPO | Source: Ambulatory Visit | Attending: Obstetrics and Gynecology | Admitting: Obstetrics and Gynecology

## 2014-11-07 ENCOUNTER — Other Ambulatory Visit: Payer: Self-pay | Admitting: Obstetrics and Gynecology

## 2014-11-07 DIAGNOSIS — R921 Mammographic calcification found on diagnostic imaging of breast: Secondary | ICD-10-CM

## 2014-11-09 ENCOUNTER — Encounter: Payer: Self-pay | Admitting: Family

## 2014-11-09 ENCOUNTER — Ambulatory Visit (INDEPENDENT_AMBULATORY_CARE_PROVIDER_SITE_OTHER): Payer: BC Managed Care – PPO | Admitting: Family

## 2014-11-09 VITALS — BP 202/94 | HR 64 | Wt 211.0 lb

## 2014-11-09 DIAGNOSIS — I10 Essential (primary) hypertension: Secondary | ICD-10-CM

## 2014-11-09 DIAGNOSIS — F430 Acute stress reaction: Secondary | ICD-10-CM

## 2014-11-09 DIAGNOSIS — G4700 Insomnia, unspecified: Secondary | ICD-10-CM

## 2014-11-09 MED ORDER — AMLODIPINE-VALSARTAN-HCTZ 10-320-25 MG PO TABS: 1.0000 | ORAL_TABLET | Freq: Every day | ORAL | Status: DC

## 2014-11-09 MED ORDER — CLONAZEPAM 1 MG PO TABS: 1.0000 mg | ORAL_TABLET | Freq: Two times a day (BID) | ORAL | Status: DC | PRN

## 2014-11-09 NOTE — Progress Notes (Signed)
Subjective:    Patient ID: [name], female    DOB: 31-Jul-1956, 58 y.o.   MRN: [id number]  HPI 58 year old African-American female, nonsmoker with a history of hypertension is in today for recheck. Reports running out of her Exforge and is only been taking Toprol 100 mg. Therefore, her blood pressure is in the 200s today. Also reports increased stress with having to undergo a biopsy of her breast to evaluate possible malignancy. Additionally, has stress at work. Her son's father was recently diagnosed with stage IV lung cancer. She is having difficulty sleeping due to worrying. Requesting something to help with sleep.   Review of Systems  Constitutional: Negative.   HENT: Negative.   Respiratory: Negative.   Cardiovascular: Negative.   Gastrointestinal: Negative.   Endocrine: Negative.   Genitourinary: Negative.   Musculoskeletal: Negative.   Skin: Negative.   Allergic/Immunologic: Negative.   Neurological: Negative.   Hematological: Negative.   Psychiatric/Behavioral: Negative.    Past Medical History  Diagnosis Date  . Hypertension   . Pulmonary nodule, left   . Obesity   . Sarcoidosis   . GERD (gastroesophageal reflux disease)   . Rectal cancer   . Cleft lip     repaired   . Bifid uvula     History   Social History  . Marital Status: Single    Spouse Name: N/A    Number of Children: N/A  . 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 Smoking status: Former Smoker -- 0.50 packs/day for 1 years    Types: Cigarettes    Quit date: 12/16/1993  . Smokeless tobacco: Never Used     Comment: smoked breifly while in college  . Alcohol Use: Yes  . Drug Use: No  . Sexual Activity: Not on file   Other Topics Concern  . Not on file   Social History Narrative    History reviewed. No pertinent past surgical history.  Family History  Problem Relation Age of Onset  . Allergies Mother   . Allergies Sister    . Allergies Brother   . Asthma Mother   . Sarcoidosis Neg Hx   . Asthma Brother   . Asthma Sister     Allergies  Allergen Reactions  . Penicillins Hives    Current Outpatient Prescriptions on File Prior to Visit  Medication Sig Dispense Refill  . albuterol (PROAIR HFA) 108 (90 BASE) MCG/ACT inhaler Inhale 2 puffs into the lungs every 6 (six) hours as needed for wheezing or shortness of breath.    . famotidine (PEPCID) 20 MG tablet Take 20 mg by mouth at bedtime.    . metoprolol succinate (TOPROL-XL) 100 MG 24 hr tablet Take 1 tablet (100 mg total) by mouth daily. Take with or immediately following a meal. 30 tablet 3  . Naphazoline-Pheniramine (EYE ALLERGY RELIEF) 0.027-0.315 % SOLN Apply 1 drop to eye.    . naproxen (NAPROSYN) 500 MG tablet Take 1 tablet (500 mg total) by mouth 2 (two) times daily with a meal. 30 tablet 0  . Naproxen Sodium (ALEVE PO) Take 2 tablets by mouth daily as needed (pain).    . RABEprazole (ACIPHEX) 20 MG tablet Take 20 mg by mouth daily.    . Triamcinolone Acetonide (TRIAMCINOLONE 0.1 % CREAM : EUCERIN) CREA Apply 1 application topically 2 (two) times daily. 60 each 1   No current facility-administered medications on file prior to visit.  BP 202/94 mmHg  Pulse 64  Wt 211 lb (95.709 kg)chart    Objective:   Physical Exam  Constitutional: She is oriented to person, place, and time. She appears well-developed and well-nourished.  Neck: Normal range of motion. Neck supple. No thyromegaly present.  Cardiovascular: Normal rate, regular rhythm and normal heart sounds.   Pulmonary/Chest: Effort normal and breath sounds normal.  Abdominal: Soft. Bowel sounds are normal.  Musculoskeletal: Normal range of motion.  Neurological: She is alert and oriented to person, place, and time.  Skin: Skin is warm and dry.  Psychiatric: She has a normal mood and affect.          Assessment & Plan:  Ardath was seen today for follow-up.  Diagnoses and associated  orders for this visit:  Uncontrolled hypertension  Acute stress reaction  Insomnia  Other Orders - Amlodipine-Valsartan-HCTZ (EXFORGE HCT) 10-320-25 MG TABS; Take 1 tablet by mouth daily. - clonazePAM (KLONOPIN) 1 MG tablet; Take 1 tablet (1 mg total) by mouth 2 (two) times daily as needed for anxiety.    Adverse healthy diet, exercise. Encouraged medication compliance. Her blood pressure is elevated today because she has not been taken Exforge.

## 2014-11-09 NOTE — Patient Instructions (Signed)
Stress and Stress Management Stress is a normal reaction to life events. It is what you feel when life demands more than you are used to or more than you can handle. Some stress can be useful. For example, the stress reaction can help you catch the last bus of the day, study for a test, or meet a deadline at work. But stress that occurs too often or for too long can cause problems. It can affect your emotional health and interfere with relationships and normal daily activities. Too much stress can weaken your immune system and increase your risk for physical illness. If you already have a medical problem, stress can make it worse. CAUSES  All sorts of life events may cause stress. An event that causes stress for one person may not be stressful for another person. Major life events commonly cause stress. These may be positive or negative. Examples include losing your job, moving into a new home, getting married, having a baby, or losing a loved one. Less obvious life events may also cause stress, especially if they occur day after day or in combination. Examples include working long hours, driving in traffic, caring for children, being in debt, or being in a difficult relationship. SIGNS AND SYMPTOMS Stress may cause emotional symptoms including, the following:  Anxiety. This is feeling worried, afraid, on edge, overwhelmed, or out of control.  Anger. This is feeling irritated or impatient.  Depression. This is feeling sad, down, helpless, or guilty.  Difficulty focusing, remembering, or making decisions. Stress may cause physical symptoms, including the following:   Aches and pains. These may affect your head, neck, back, stomach, or other areas of your body.  Tight muscles or clenched jaw.  Low energy or trouble sleeping. Stress may cause unhealthy behaviors, including the following:   Eating to feel better (overeating) or skipping meals.  Sleeping too little, too much, or both.  Working  too much or putting off tasks (procrastination).  Smoking, drinking alcohol, or using drugs to feel better. DIAGNOSIS  Stress is diagnosed through an assessment by your health care provider. Your health care provider will ask questions about your symptoms and any stressful life events.Your health care provider will also ask about your medical history and may order blood tests or other tests. Certain medical conditions and medicine can cause physical symptoms similar to stress. Mental illness can cause emotional symptoms and unhealthy behaviors similar to stress. Your health care provider may refer you to a mental health professional for further evaluation.  TREATMENT  Stress management is the recommended treatment for stress.The goals of stress management are reducing stressful life events and coping with stress in healthy ways.  Techniques for reducing stressful life events include the following:  Stress identification. Self-monitor for stress and identify what causes stress for you. These skills may help you to avoid some stressful events.  Time management. Set your priorities, keep a calendar of events, and learn to say "no." These tools can help you avoid making too many commitments. Techniques for coping with stress include the following:  Rethinking the problem. Try to think realistically about stressful events rather than ignoring them or overreacting. Try to find the positives in a stressful situation rather than focusing on the negatives.  Exercise. Physical exercise can release both physical and emotional tension. The key is to find a form of exercise you enjoy and do it regularly.  Relaxation techniques. These relax the body and mind. Examples include yoga, meditation, tai chi, biofeedback, deep  breathing, progressive muscle relaxation, listening to music, being out in nature, journaling, and other hobbies. Again, the key is to find one or more that you enjoy and can do  regularly.  Healthy lifestyle. Eat a balanced diet, get plenty of sleep, and do not smoke. Avoid using alcohol or drugs to relax.  Strong support network. Spend time with family, friends, or other people you enjoy being around.Express your feelings and talk things over with someone you trust. Counseling or talktherapy with a mental health professional may be helpful if you are having difficulty managing stress on your own. Medicine is typically not recommended for the treatment of stress.Talk to your health care provider if you think you need medicine for symptoms of stress. HOME CARE INSTRUCTIONS  Keep all follow-up visits as directed by your health care provider.  Take all medicines as directed by your health care provider. SEEK MEDICAL CARE IF:  Your symptoms get worse or you start having new symptoms.  You feel overwhelmed by your problems and can no longer manage them on your own. SEEK IMMEDIATE MEDICAL CARE IF:  You feel like hurting yourself or someone else. Document Released: 05/28/2001 Document Revised: 04/18/2014 Document Reviewed: 07/27/2013 [employer] Patient Information 2015 [employer]. This information is not intended to replace advice given to you by your health care provider. Make sure you discuss any questions you have with your health care provider.  

## 2014-11-09 NOTE — Progress Notes (Signed)
Pre visit review using our clinic review tool, if applicable. No additional management support is needed unless otherwise documented below in the visit note. 

## 2014-11-22 ENCOUNTER — Ambulatory Visit
Admission: RE | Admit: 2014-11-22 | Discharge: 2014-11-22 | Disposition: A | Discharge status: Home / Self Care | Payer: BC Managed Care – PPO | Source: Ambulatory Visit | Attending: Obstetrics and Gynecology | Admitting: Obstetrics and Gynecology

## 2014-11-22 DIAGNOSIS — R921 Mammographic calcification found on diagnostic imaging of breast: Secondary | ICD-10-CM

## 2015-02-01 ENCOUNTER — Encounter: Payer: Self-pay | Admitting: Family

## 2015-02-01 ENCOUNTER — Ambulatory Visit (INDEPENDENT_AMBULATORY_CARE_PROVIDER_SITE_OTHER): Payer: BLUE CROSS/BLUE SHIELD | Admitting: Family

## 2015-02-01 VITALS — BP 180/102 | Temp 98.1°F | Wt 212.3 lb

## 2015-02-01 DIAGNOSIS — I10 Essential (primary) hypertension: Secondary | ICD-10-CM

## 2015-02-01 DIAGNOSIS — R197 Diarrhea, unspecified: Secondary | ICD-10-CM

## 2015-02-01 DIAGNOSIS — B009 Herpesviral infection, unspecified: Secondary | ICD-10-CM

## 2015-02-01 MED ORDER — HYDROCODONE-ACETAMINOPHEN 10-325 MG PO TABS: 1.0000 | ORAL_TABLET | Freq: Three times a day (TID) | ORAL | Status: DC | PRN

## 2015-02-01 MED ORDER — VALACYCLOVIR HCL 1 G PO TABS: 1000.0000 mg | ORAL_TABLET | Freq: Two times a day (BID) | ORAL | Status: DC

## 2015-02-01 NOTE — Progress Notes (Signed)
Subjective:    Patient ID: [name], female    DOB: 01-28-1956, 59 y.o.   MRN: [id number]  HPI 59 y.o. African American female presents today with chief complaint of "my rash on my backside is back". Pt states that yesterday while at work she had an episode of diarrhea that required her to leave work, she had two more episodes at home yesterday. After the diarrhea, pt states that the rash came on her backside. The rash is painful, rating it as a 9 on a 0-10 scale, the pain is constant, it is sharp; the pt has put triamcenalone cream on the rash and states that it made it hurt more. Pt has been seen for what she describes as "HPV" in the past. Pt denies fever, chills, SOB and chest pain.   Past Medical History  Diagnosis Date  . Hypertension   . Pulmonary nodule, left   . Obesity   . Sarcoidosis   . GERD (gastroesophageal reflux disease)   . Rectal cancer   . Cleft lip     repaired   . Bifid uvula     History   Social History  . Marital Status: Single    Spouse Name: N/A  . Number of Children: N/A  . 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 Smoking status: Former Smoker -- 0.50 packs/day for 1 years    Types: Cigarettes    Quit date: 12/16/1993  . Smokeless tobacco: Never Used     Comment: smoked breifly while in college  . Alcohol Use: Yes  . Drug Use: No  . Sexual Activity: Not on file   Other Topics Concern  . Not on file   Social History Narrative    No past surgical history on file.  Family History  Problem Relation Age of Onset  . Allergies Mother   . Allergies Sister   . Allergies Brother   . Asthma Mother   . Sarcoidosis Neg Hx   . Asthma Brother   . Asthma Sister     Allergies  Allergen Reactions  . Penicillins Hives    Current Outpatient Prescriptions on File Prior to Visit  Medication Sig Dispense Refill  . albuterol (PROAIR HFA) 108 (90 BASE) MCG/ACT inhaler Inhale 2 puffs  into the lungs every 6 (six) hours as needed for wheezing or shortness of breath.    . Amlodipine-Valsartan-HCTZ (EXFORGE HCT) 10-320-25 MG TABS Take 1 tablet by mouth daily. 30 tablet 3  . clonazePAM (KLONOPIN) 1 MG tablet Take 1 tablet (1 mg total) by mouth 2 (two) times daily as needed for anxiety. 20 tablet 1  . famotidine (PEPCID) 20 MG tablet Take 20 mg by mouth at bedtime.    . metoprolol succinate (TOPROL-XL) 100 MG 24 hr tablet Take 1 tablet (100 mg total) by mouth daily. Take with or immediately following a meal. 30 tablet 3  . Naphazoline-Pheniramine (EYE ALLERGY RELIEF) 0.027-0.315 % SOLN Apply 1 drop to eye.    . naproxen (NAPROSYN) 500 MG tablet Take 1 tablet (500 mg total) by mouth 2 (two) times daily with a meal. 30 tablet 0  . Naproxen Sodium (ALEVE PO) Take 2 tablets by mouth daily as needed (pain).    . RABEprazole (ACIPHEX) 20 MG tablet Take 20 mg by mouth daily.    . Triamcinolone Acetonide (TRIAMCINOLONE 0.1 % CREAM : EUCERIN) CREA Apply 1 application topically 2 (two) times daily.  60 each 1   No current facility-administered medications on file prior to visit.    BP 180/102 mmHg  Temp(Src) 98.1 F (36.7 C) (Oral)  Wt 212 lb 4.8 oz (96.299 kg)chart  Review of Systems  Constitutional: Positive for activity change. Negative for fever, chills and fatigue.  Respiratory: Negative.  Negative for cough and shortness of breath.   Cardiovascular: Negative.  Negative for chest pain.  Gastrointestinal: Positive for rectal pain.       Pain on rectum due to rash and to generalized buttocks area.   Skin: Positive for rash.       To buttocks.   Psychiatric/Behavioral: Negative.        Objective:   Physical Exam  Constitutional: She appears well-developed and well-nourished. She is active.  Cardiovascular: Normal rate, regular rhythm and normal pulses.   Pulmonary/Chest: Effort normal and breath sounds normal.  Abdominal: Soft. Normal appearance and bowel sounds are normal.    Neurological: She is alert.  Skin: Skin is warm. Rash noted. Rash is vesicular.  Vesicular rash to bilateral, internal buttocks.   Psychiatric: Her speech is normal and behavior is normal. Her mood appears anxious. She exhibits a depressed mood.          Assessment & Plan:  Alan was seen today for diarrhea.  Diagnoses and all orders for this visit:  Herpes simplex Orders: -     HSV 2 Antibody, IgG  Uncontrolled hypertension  Diarrhea  Other orders -     valACYclovir (VALTREX) 1000 MG tablet; Take 1 tablet (1,000 mg total) by mouth 2 (two) times daily. -     HYDROcodone-acetaminophen (NORCO) 10-325 MG per tablet; Take 1 tablet by mouth every 8 (eight) hours as needed.   Education: Rash appears to be a HSV rash, not HPV. Will do blood testing to confirm. Pt educated that HSV is characterized by painful lesions that occasional flare up.   Follow up: 2 weeks for blood pressure recheck.

## 2015-02-01 NOTE — Patient Instructions (Signed)
Genital Herpes °Genital herpes is a sexually transmitted disease. This means that it is a disease passed by having sex with an infected person. There is no cure for genital herpes. The time between attacks can be months to years. The virus may live in a person but produce no problems (symptoms). This infection can be passed to a baby as it travels down the birth canal (vagina). In a newborn, this can cause central nervous system damage, eye damage, or even death. The virus that causes genital herpes is usually HSV-2 virus. The virus that causes oral herpes is usually HSV-1. The diagnosis (learning what is wrong) is made through culture results. °SYMPTOMS  °Usually symptoms of pain and itching begin a few days to a week after contact. It first appears as small blisters that progress to small painful ulcers which then scab over and heal after several days. It affects the outer genitalia, birth canal, cervix, penis, anal area, buttocks, and thighs. °HOME CARE INSTRUCTIONS  °· Keep ulcerated areas dry and clean. °· Take medications as directed. Antiviral medications can speed up healing. They will not prevent recurrences or cure this infection. These medications can also be taken for suppression if there are frequent recurrences. °· While the infection is active, it is contagious. Avoid all sexual contact during active infections. °· Condoms may help prevent spread of the herpes virus. °· Practice safe sex. °· Wash your hands thoroughly after touching the genital area. °· Avoid touching your eyes after touching your genital area. °· Inform your caregiver if you have had genital herpes and become pregnant. It is your responsibility to insure a safe outcome for your baby in this pregnancy. °· Only take over-the-counter or prescription medicines for pain, discomfort, or fever as directed by your caregiver. °SEEK MEDICAL CARE IF:  °· You have a recurrence of this infection. °· You do not respond to medications and are not  improving. °· You have new sources of pain or discharge which have changed from the original infection. °· You have an oral temperature above 102° F (38.9° C). °· You develop abdominal pain. °· You develop eye pain or signs of eye infection. °Document Released: 11/29/2000 Document Revised: 02/24/2012 Document Reviewed: 12/20/2009 °ExitCare® Patient Information ©2015 [employer]. This information is not intended to replace advice given to you by your health care provider. Make sure you discuss any questions you have with your health care provider. ° °

## 2015-02-01 NOTE — Progress Notes (Signed)
Pre visit review using our clinic review tool, if applicable. No additional management support is needed unless otherwise documented below in the visit note. 

## 2015-02-03 LAB — HSV 2 ANTIBODY, IGG: HSV 2 Glycoprotein G Ab, IgG: 11.46 IV — ABNORMAL HIGH

## 2015-02-09 ENCOUNTER — Ambulatory Visit (INDEPENDENT_AMBULATORY_CARE_PROVIDER_SITE_OTHER): Payer: BLUE CROSS/BLUE SHIELD | Admitting: Family

## 2015-02-09 ENCOUNTER — Encounter: Payer: Self-pay | Admitting: Family

## 2015-02-09 VITALS — BP 162/96 | HR 73 | Temp 98.4°F | Wt 212.7 lb

## 2015-02-09 DIAGNOSIS — A6000 Herpesviral infection of urogenital system, unspecified: Secondary | ICD-10-CM

## 2015-02-09 DIAGNOSIS — E669 Obesity, unspecified: Secondary | ICD-10-CM

## 2015-02-09 DIAGNOSIS — I10 Essential (primary) hypertension: Secondary | ICD-10-CM

## 2015-02-09 MED ORDER — VALACYCLOVIR HCL 500 MG PO TABS: 500.0000 mg | ORAL_TABLET | Freq: Once | ORAL | Status: DC

## 2015-02-09 MED ORDER — METOPROLOL SUCCINATE ER 100 MG PO TB24: 200.0000 mg | ORAL_TABLET | Freq: Every day | ORAL | Status: DC

## 2015-02-09 NOTE — Patient Instructions (Signed)
DASH Eating Plan DASH stands for "Dietary Approaches to Stop Hypertension." The DASH eating plan is a healthy eating plan that has been shown to reduce high blood pressure (hypertension). Additional health benefits may include reducing the risk of type 2 diabetes mellitus, heart disease, and stroke. The DASH eating plan may also help with weight loss. WHAT DO I NEED TO KNOW ABOUT THE DASH EATING PLAN? For the DASH eating plan, you will follow these general guidelines:  Choose foods with a percent daily value for sodium of less than 5% (as listed on the food label).  Use salt-free seasonings or herbs instead of table salt or sea salt.  Check with your health care provider or pharmacist before using salt substitutes.  Eat lower-sodium products, often labeled as "lower sodium" or "no salt added."  Eat fresh foods.  Eat more vegetables, fruits, and low-fat dairy products.  Choose whole grains. Look for the word "whole" as the first word in the ingredient list.  Choose fish and skinless chicken or [place] more often than red meat. Limit fish, poultry, and meat to 6 oz (170 g) each day.  Limit sweets, desserts, sugars, and sugary drinks.  Choose heart-healthy fats.  Limit cheese to 1 oz (28 g) per day.  Eat more home-cooked food and less restaurant, buffet, and fast food.  Limit fried foods.  Follow-up in 3 weeks for blood pressure recheck.  Cook foods using methods other than frying.  Limit canned vegetables. If you do use them, rinse them well to decrease the sodium.  When eating at a restaurant, ask that your food be prepared with less salt, or no salt if possible. WHAT FOODS CAN I EAT? Seek help from a dietitian for individual calorie needs. Grains Whole grain or whole wheat bread. Brown rice. Whole grain or whole wheat pasta. Quinoa, bulgur, and whole grain cereals. Low-sodium cereals. Corn or whole wheat flour tortillas. Whole grain cornbread. Whole grain crackers. Low-sodium  crackers. Vegetables Fresh or frozen vegetables (raw, steamed, roasted, or grilled). Low-sodium or reduced-sodium tomato and vegetable juices. Low-sodium or reduced-sodium tomato sauce and paste. Low-sodium or reduced-sodium canned vegetables.  Fruits All fresh, canned (in natural juice), or frozen fruits. Meat and Other Protein Products Ground beef (85% or leaner), grass-fed beef, or beef trimmed of fat. Skinless chicken or [place]. Ground chicken or [place]. Pork trimmed of fat. All fish and seafood. Eggs. Dried beans, peas, or lentils. Unsalted nuts and seeds. Unsalted canned beans. Dairy Low-fat dairy products, such as skim or 1% milk, 2% or reduced-fat cheeses, low-fat ricotta or cottage cheese, or plain low-fat yogurt. Low-sodium or reduced-sodium cheeses. Fats and Oils Tub margarines without trans fats. Light or reduced-fat mayonnaise and salad dressings (reduced sodium). Avocado. Safflower, olive, or canola oils. Natural peanut or almond butter. Other Unsalted popcorn and pretzels. The items listed above may not be a complete list of recommended foods or beverages. Contact your dietitian for more options. WHAT FOODS ARE NOT RECOMMENDED? Grains White bread. White pasta. White rice. Refined cornbread. Bagels and croissants. Crackers that contain trans fat. Vegetables Creamed or fried vegetables. Vegetables in a cheese sauce. Regular canned vegetables. Regular canned tomato sauce and paste. Regular tomato and vegetable juices. Fruits Dried fruits. Canned fruit in light or heavy syrup. Fruit juice. Meat and Other Protein Products Fatty cuts of meat. Ribs, chicken wings, bacon, sausage, bologna, salami, chitterlings, fatback, hot dogs, bratwurst, and packaged luncheon meats. Salted nuts and seeds. Canned beans with salt. Dairy Whole or 2% milk, cream,  half-and-half, and cream cheese. Whole-fat or sweetened yogurt. Full-fat cheeses or blue cheese. Nondairy creamers and whipped toppings.  Processed cheese, cheese spreads, or cheese curds. Condiments Onion and garlic salt, seasoned salt, table salt, and sea salt. Canned and packaged gravies. Worcestershire sauce. Tartar sauce. Barbecue sauce. Teriyaki sauce. Soy sauce, including reduced sodium. Steak sauce. Fish sauce. Oyster sauce. Cocktail sauce. Horseradish. Ketchup and mustard. Meat flavorings and tenderizers. Bouillon cubes. Hot sauce. Tabasco sauce. Marinades. Taco seasonings. Relishes. Fats and Oils Butter, stick margarine, lard, shortening, ghee, and bacon fat. Coconut, palm kernel, or palm oils. Regular salad dressings. Other Pickles and olives. Salted popcorn and pretzels. The items listed above may not be a complete list of foods and beverages to avoid. Contact your dietitian for more information. WHERE CAN I FIND MORE INFORMATION? National Heart, Lung, and Blood Institute: travelstabloid.com Document Released: 11/21/2011 Document Revised: 04/18/2014 Document Reviewed: 10/06/2013 [facility] Patient Information 2015 [place], [place]. This information is not intended to replace advice given to you by your health care provider. Make sure you discuss any questions you have with your health care provider.

## 2015-02-09 NOTE — Progress Notes (Signed)
Subjective:    Patient ID: [name], female    DOB: 30-Nov-1956, 59 y.o.   MRN: [id number]  HPI Patient 59 year old, African American, female, seen today for recheck of blood pressure. History of obesity, hypertension, and genital herpes.  Blood pressure is elevated 162/80 at present. Patient states that she regularly eats a high sodium diet and intakes caffeine regularly. She experienced the death of family member on yesterday.  Patient requested information about HSV-2 lab results.  Requested a letter to return to work.    Review of Systems  Constitutional: Negative.   HENT: Negative.   Eyes: Negative.   Respiratory: Negative.   Cardiovascular: Negative.   Gastrointestinal: Negative.   Endocrine: Negative.   Genitourinary: Negative.   Musculoskeletal: Negative.   Skin: Negative.   Allergic/Immunologic: Negative.   Neurological: Negative.   Hematological: Negative.   Psychiatric/Behavioral: Negative.    . Past Medical History  Diagnosis Date  . Hypertension   . Pulmonary nodule, left   . Obesity   . Sarcoidosis   . GERD (gastroesophageal reflux disease)   . Rectal cancer   . Cleft lip     repaired   . Bifid uvula     History   Social History  . Marital Status: Single    Spouse Name: N/A  . Number of Children: N/A  . 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 Smoking status: Former Smoker -- 0.50 packs/day for 1 years    Types: Cigarettes    Quit date: 12/16/1993  . Smokeless tobacco: Never Used     Comment: smoked breifly while in college  . Alcohol Use: Yes  . Drug Use: No  . Sexual Activity: Not on file   Other Topics Concern  . Not on file   Social History Narrative    History reviewed. No pertinent past surgical history.  Family History  Problem Relation Age of Onset  . Allergies Mother   . Allergies Sister   . Allergies Brother   . Asthma Mother   . Sarcoidosis Neg Hx     . Asthma Brother   . Asthma Sister     Allergies  Allergen Reactions  . Penicillins Hives    Current Outpatient Prescriptions on File Prior to Visit  Medication Sig Dispense Refill  . albuterol (PROAIR HFA) 108 (90 BASE) MCG/ACT inhaler Inhale 2 puffs into the lungs every 6 (six) hours as needed for wheezing or shortness of breath.    . Amlodipine-Valsartan-HCTZ (EXFORGE HCT) 10-320-25 MG TABS Take 1 tablet by mouth daily. 30 tablet 3  . clonazePAM (KLONOPIN) 1 MG tablet Take 1 tablet (1 mg total) by mouth 2 (two) times daily as needed for anxiety. 20 tablet 1  . famotidine (PEPCID) 20 MG tablet Take 20 mg by mouth at bedtime.    Marland Kitchen HYDROcodone-acetaminophen (NORCO) 10-325 MG per tablet Take 1 tablet by mouth every 8 (eight) hours as needed. 20 tablet 0  . Naphazoline-Pheniramine (EYE ALLERGY RELIEF) 0.027-0.315 % SOLN Apply 1 drop to eye.    . naproxen (NAPROSYN) 500 MG tablet Take 1 tablet (500 mg total) by mouth 2 (two) times daily with a meal. 30 tablet 0  . Naproxen Sodium (ALEVE PO) Take 2 tablets by mouth daily as needed (pain).    . RABEprazole (ACIPHEX) 20 MG tablet Take 20 mg by mouth daily.    . Triamcinolone Acetonide (TRIAMCINOLONE 0.1 % CREAM :  EUCERIN) CREA Apply 1 application topically 2 (two) times daily. 60 each 1  . valACYclovir (VALTREX) 1000 MG tablet Take 1 tablet (1,000 mg total) by mouth 2 (two) times daily. 20 tablet 0   No current facility-administered medications on file prior to visit.    BP 162/96 mmHg  Pulse 73  Temp(Src) 98.4 F (36.9 C) (Oral)  Wt 212 lb 11.2 oz (96.48 kg)  SpO2 94%chart     Objective:   Physical Exam  Constitutional: She is oriented to person, place, and time. She appears well-developed and well-nourished.  HENT:  Head: Normocephalic and atraumatic.  Right Ear: External ear normal.  Left Ear: External ear normal.  Nose: Nose normal.  Mouth/Throat: Oropharynx is clear and moist.  Eyes: Conjunctivae and EOM are normal. Pupils  are equal, round, and reactive to light. Right eye exhibits no discharge. Left eye exhibits no discharge.  Neck: Normal range of motion. Neck supple. No JVD present.  Cardiovascular: Normal rate, regular rhythm, normal heart sounds and intact distal pulses.   Pulmonary/Chest: Effort normal and breath sounds normal.  Musculoskeletal: Normal range of motion.  Neurological: She is alert and oriented to person, place, and time. She has normal reflexes.  Skin: Skin is warm and dry.  Psychiatric: She has a normal mood and affect. Her behavior is normal. Judgment and thought content normal.          Assessment & Plan:  .Mora was seen today for follow-up.  Diagnoses and all orders for this visit:  Essential hypertension  Obesity  Genital herpes  Other orders -     metoprolol succinate (TOPROL-XL) 100 MG 24 hr tablet; Take 2 tablets (200 mg total) by mouth daily. Take with or immediately following a meal. -     Discontinue: valACYclovir (VALTREX) 500 MG tablet; Take 1 tablet (500 mg total) by mouth once. -     valACYclovir (VALTREX) 500 MG tablet; Take 1 tablet (500 mg total) by mouth once.   Plan: Essential hypertension and Obesity: Education provided regarding eliminating table salt and reducing saturated fats. Increased Toprol XL 200 mg. Exercise 3-4 days per week/45 minutes per day.  Genital herpes: Per HSV 2 Antibody, IgG, patient is positive for genital herpes.  Patient agreed to suppression therapy with Valtrex 500 mg, once daily.

## 2015-02-09 NOTE — Progress Notes (Signed)
Pre visit review using our clinic review tool, if applicable. No additional management support is needed unless otherwise documented below in the visit note. 

## 2015-02-10 ENCOUNTER — Ambulatory Visit: Payer: BC Managed Care – PPO | Admitting: Family

## 2015-02-16 DIAGNOSIS — Z0279 Encounter for issue of other medical certificate: Secondary | ICD-10-CM

## 2015-02-24 ENCOUNTER — Telehealth: Payer: Self-pay

## 2015-02-24 NOTE — Telephone Encounter (Signed)
Per Padonda's request called and left a message for pt to return call to confirm dates needed on form.

## 2015-02-27 ENCOUNTER — Telehealth: Payer: Self-pay | Admitting: Family

## 2015-02-27 NOTE — Telephone Encounter (Signed)
Abby from [employer] called to ask what pt diagnosis was from 01/31/15 thru 02/13/15 This is for pt short term disability    Abby [phone]

## 2015-02-27 NOTE — Telephone Encounter (Signed)
Left message for Abby to return my call.

## 2015-02-28 NOTE — Telephone Encounter (Signed)
Forms faxed back to Abby at [phone]

## 2015-02-28 NOTE — Telephone Encounter (Signed)
Spoke with Abby to confirm dx. Abby also confirmed dates of pt's absences from work

## 2015-03-01 ENCOUNTER — Ambulatory Visit (INDEPENDENT_AMBULATORY_CARE_PROVIDER_SITE_OTHER): Payer: BLUE CROSS/BLUE SHIELD | Admitting: Family

## 2015-03-01 ENCOUNTER — Encounter: Payer: Self-pay | Admitting: Family

## 2015-03-01 VITALS — BP 120/80 | Temp 98.2°F | Wt 217.1 lb

## 2015-03-01 DIAGNOSIS — G4700 Insomnia, unspecified: Secondary | ICD-10-CM

## 2015-03-01 DIAGNOSIS — F4321 Adjustment disorder with depressed mood: Secondary | ICD-10-CM

## 2015-03-01 NOTE — Progress Notes (Signed)
Subjective:    Patient ID: [name], female    DOB: [birth year], 59 y.o.   MRN: [id number]  HPI 59 year old African-American female, nonsmoker is in today with complaints of grief and fatigue. Reports having 2 family members and uncle and aunt die over the past 13 days. Most recent panel was 2 days ago. Reports twisting her favorite uncle. She's having a difficult time staying asleep at night. Not currently taking any medication for relief. Uncle died unexpectedly after a fall and then finding out he had brain cancer. She denies any feelings of helplessness or hopelessness, no thoughts of death or dying.   Review of Systems  Constitutional: Negative.   Respiratory: Negative.   Cardiovascular: Negative.   Endocrine: Negative.   Genitourinary: Negative.   Musculoskeletal: Negative.   Neurological: Negative.   Psychiatric/Behavioral: Positive for sleep disturbance.   Past Medical History  Diagnosis Date  . Hypertension   . Pulmonary nodule, left   . Obesity   . Sarcoidosis   . GERD (gastroesophageal reflux disease)   . Rectal cancer   . Cleft lip     repaired   . Bifid uvula     History   Social History  . Marital Status: Single    Spouse Name: N/A  . Number of Children: N/A  . 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 Smoking status: Former Smoker -- 0.50 packs/day for 1 years    Types: Cigarettes    Quit date: 12/16/1993  . Smokeless tobacco: Never Used     Comment: smoked breifly while in college  . Alcohol Use: Yes  . Drug Use: No  . Sexual Activity: Not on file   Other Topics Concern  . Not on file   Social History Narrative    No past surgical history on file.  Family History  Problem Relation Age of Onset  . Allergies Mother   . Allergies Sister   . Allergies Brother   . Asthma Mother   . Sarcoidosis Neg Hx   . Asthma Brother   . Asthma Sister     Allergies  Allergen Reactions    . Penicillins Hives    Current Outpatient Prescriptions on File Prior to Visit  Medication Sig Dispense Refill  . albuterol (PROAIR HFA) 108 (90 BASE) MCG/ACT inhaler Inhale 2 puffs into the lungs every 6 (six) hours as needed for wheezing or shortness of breath.    . Amlodipine-Valsartan-HCTZ (EXFORGE HCT) 10-320-25 MG TABS Take 1 tablet by mouth daily. 30 tablet 3  . metoprolol succinate (TOPROL-XL) 100 MG 24 hr tablet Take 2 tablets (200 mg total) by mouth daily. Take with or immediately following a meal. 90 tablet 3  . naproxen (NAPROSYN) 500 MG tablet Take 1 tablet (500 mg total) by mouth 2 (two) times daily with a meal. 30 tablet 0  . Triamcinolone Acetonide (TRIAMCINOLONE 0.1 % CREAM : EUCERIN) CREA Apply 1 application topically 2 (two) times daily. 60 each 1  . valACYclovir (VALTREX) 500 MG tablet Take 1 tablet (500 mg total) by mouth once. 90 tablet 1  . clonazePAM (KLONOPIN) 1 MG tablet Take 1 tablet (1 mg total) by mouth 2 (two) times daily as needed for anxiety. (Patient not taking: Reported on 03/01/2015) 20 tablet 1  . famotidine (PEPCID) 20 MG tablet Take 20 mg by mouth at bedtime.    Marland Kitchen HYDROcodone-acetaminophen (NORCO) 10-325 MG per tablet Take 1 tablet  by mouth every 8 (eight) hours as needed. (Patient not taking: Reported on 03/01/2015) 20 tablet 0  . Naphazoline-Pheniramine (EYE ALLERGY RELIEF) 0.027-0.315 % SOLN Apply 1 drop to eye.    . Naproxen Sodium (ALEVE PO) Take 2 tablets by mouth daily as needed (pain).    . RABEprazole (ACIPHEX) 20 MG tablet Take 20 mg by mouth daily.     No current facility-administered medications on file prior to visit.    BP 120/80 mmHg  Temp(Src) 98.2 F (36.8 C) (Oral)  Wt 217 lb 1.6 oz (98.476 kg)chart    Objective:   Physical Exam  Constitutional: She is oriented to person, place, and time. She appears well-developed and well-nourished.  Neck: Normal range of motion. Neck supple.  Cardiovascular: Normal rate, regular rhythm and  normal heart sounds.   Pulmonary/Chest: Effort normal and breath sounds normal.  Musculoskeletal: Normal range of motion.  Neurological: She is alert and oriented to person, place, and time.  Skin: Skin is warm and dry.  Psychiatric: She has a normal mood and affect.          Assessment & Plan:  Explain the grief is a normal reaction after death. Encouraged exercise. Klonopin at bedtime to help with her sleep. If she continues to feel grief over the next 2-3 months without improvement return. Consider counseling.

## 2015-03-01 NOTE — Patient Instructions (Addendum)
Grief Reaction Grief is a normal response to the death of someone close to you. Feelings of fear, anger, and guilt can affect almost everyone who loses someone they love. Symptoms of depression are also common. These include problems with sleep, loss of appetite, and lack of energy. These grief reaction symptoms often last for weeks to months after a loss. They may also return during special times that remind you of the person you lost, such as an anniversary or birthday. Anxiety, insomnia, irritability, and deep depression may last beyond the period of normal grief. If you experience these feelings for 6 months or longer, you may have clinical depression. Clinical depression requires further medical attention. If you think that you have clinical depression, you should contact your caregiver. If you have a history of depression or a family history of depression, you are at greater risk of clinical depression. You are also at greater risk of developing clinical depression if the loss was traumatic or the loss was of someone with whom you had unresolved issues.  A grief reaction can become complicated by being blocked. This means being unable to cry or express extreme emotions. This may prolong the grieving period and worsen the emotional effects of the loss. Mourning is a natural event in human life. A healthy grief reaction is one that is not blocked. It requires a time of sadness and readjustment. It is very important to share your sorrow and fear with others, especially close friends and family. Professional counselors and clergy can also help you process your grief. Document Released: 12/02/2005 Document Revised: 04/18/2014 Document Reviewed: 08/12/2006 [facility] Patient Information 2015 [place], [place]. This information is not intended to replace advice given to you by your health care provider. Make sure you discuss any questions you have with your health care provider. Death and Dying When a person's  health care team determines that a terminal illness can no longer be controlled, medical testing and treatment often stop. But the person's care continues. The care focuses on making the person comfortable. The person receives medicines and treatments to control pain and other symptoms, such as constipation, nausea, and shortness of breath. Some people remain at home during this time, while others enter a hospital or other facility. Either way, services are available to help individuals and their families with the medical, psychological, and spiritual issues surrounding dying. A hospice team often provides such services.  The time at the end of life is different for each person. Individuals and their families have unique needs for information and support. Questions and concerns about the end of life should be discussed with the health care team as they arise. HOW LONG IS THE PERSON EXPECTED TO LIVE? Family members often want to know how long their loved one is expected to live. This is a hard question to answer. Factors such as where the disease is located and whether the person has other illnesses can affect what will happen. Although health care providers may be able to make an estimate based on what they know about the person, they might be hesitant to do so. Health care providers may be concerned about overestimating or underestimating the person's life span. They also might be fearful of instilling false hope or destroying the person's hope. WHEN CARING FOR THE PERSON AT HOME, WHEN SHOULD YOU CALL A PROFESSIONAL? When caring for your loved one at home, there may be times when you need assistance from your loved one's health care team. You can contact the health  care team for help in any of the following situations:  Your loved one is in pain that is not relieved by the prescribed dose of pain medicine.  Your loved one shows discomfort, such as grimacing or moaning.  Your loved one is having trouble  breathing and seems upset.  Your loved one is unable to urinate or empty the bowels.  Your loved one has fallen.  Your loved one is very depressed or talking about committing suicide.  You have difficulty giving medicine to your loved one.  You are overwhelmed by caring for your loved one or are too grieved or afraid to be with your loved one.  At any time you do not know how to handle a situation. WHAT ARE SOME WAYS THAT YOU CAN PROVIDE EMOTIONAL COMFORT TO YOUR SICK LOVED ONE? Everyone has different needs, but some emotions are common to most individuals who are dying. These include fear of abandonment and fear of being a burden. They also have concerns about loss of dignity and loss of control. Some ways you can provide comfort are as follows:  Keep your loved one company. Talk, watch movies, read, or just be with him or her.  Allow your loved one to express fears and concerns about dying, such as leaving family and friends behind. Be prepared to listen.  Be willing to reminisce about your loved one's life.  Avoid withholding difficult information. Most people prefer to be included in discussions about issues that concern them.  Reassure your loved one that you will honor advance directives, such as living wills.  Ask if there is anything you can do.  Respect your loved one's need for privacy. WHAT ARE THE SIGNS THAT DEATH IS APPROACHING? Certain signs and symptoms can help you anticipate when death is near. They are described below, along with suggestions for managing them. It is important to remember that not every person experiences each of the signs and symptoms. Having one or more of these symptoms does not always indicate that your loved one is close to death. A member of your loved one's health care team can give you information about what to expect.  Drowsiness, increased sleep, or unresponsiveness. This is caused by changes in the person's metabolism.  Confusion about  time, place, or identity of family and friends; restlessness; visions of people and places that are not present; pulling at bed linens or clothing (caused in part by changes in your loved one's metabolism).  Withdrawal and decreased socialization. This may be caused by decreased oxygen to the brain, decreased blood flow, and mental preparation for dying.  Decreased need for food and fluids, and loss of appetite. This is caused by the body's need to conserve energy and its decreasing ability to use food and fluids properly.  Loss of bladder or bowel control. This is caused by the relaxing of muscles in the pelvic area. You can talk to your loved one's health care team about the possibility of inserting a catheter. A member of the health care team can teach you how to take care of the catheter, if one is needed.  Skin becomes cool to the touch, particularly the hands and feet. Skin may become bluish in color, especially on the underside of the body. This is caused by decreased circulation to the extremities.  Rattling or gurgling sounds while breathing that may be loud; breathing that is irregular and shallow; decreased number of breaths per minute; breathing that alternates between rapid and slow. This is  caused by congestion from fluid, a buildup of waste products in the body, and a decrease in circulation to the organs.  Turning the head toward a light source. This is caused by decreasing vision. Leave soft, indirect lights on in the room.  Increased difficulty controlling pain. This is caused by progression of the disease. It is important to provide pain medicines as your loved one's health care provider has prescribed.  Involuntary movements, changes in heart rate, and loss of reflexes in the legs and arms are also signs that the end of life is near. WHAT CAN YOU DO TO MAKE THE PERSON COMFORTABLE?  Plan visits and activities for times when your loved one is alert. It is important to speak  directly to your loved one and talk as if he or she can hear, even if there is no response. Most people are still able to hear after they are no longer able to speak. Your loved one should not be shaken if he or she does not respond.  Gently remind your loved one of the time, date, and people who are present. If your loved one is agitated, do not attempt to restrain him or her. Be calm and reassuring. Speaking calmly may help to re-orient your loved one.  Allow your loved one to choose if and when to eat or drink. Ice chips, water, or juice may be refreshing if the person can swallow. Keep the person's mouth and lips moist with products such as glycerin swabs and lip balm.  Keep your loved one as clean, dry, and comfortable as possible. Place disposable pads on the bed beneath the person and remove them when they become soiled.  Reposition your loved one's body from one side to the back to the other side every few hours to prevent bed sores. Try to minimize pressure under heels and elbows by placing a pillow or foam pads where needed.  Blankets can be used to warm your loved one. Although your loved one's skin may be cool, he or she is usually not aware of feeling cold. You should avoid warming your loved one with electric blankets or heating pads. These can cause burns.  Breathing may be easier if you turn your loved one's body to the side and place pillows beneath the head and behind the back. Although labored breathing can sound very distressing to you, gurgling and rattling sounds do not cause discomfort to your loved one. An external source of oxygen may benefit some individuals. If your loved one is able to swallow, ice chips also may help. In addition, a cool mist humidifier may help make your loved one's breathing more comfortable. You may also try a fan to circulate the air.  Contact the health care provider if the prescribed pain-relieving medicine dose does not seem adequate. With the help  of the health care team, you can also explore methods such as massage and relaxation techniques to help with pain. WHAT ARE THE SIGNS THAT THE PERSON HAS DIED?   There is no breathing or pulse.  The eyes do not move or blink, and the pupils are enlarged (dilated) and do not change with light. The eyelids may be slightly open.  The jaw is relaxed, and the mouth is slightly open.  The body releases the bowel and bladder contents.  The person does not respond to being touched or spoken to. WHAT SHOULD HAPPEN AFTER THE PERSON HAS DIED?  After the person has passed away, there is no need  to hurry with arrangements. Family members and close friends may wish to sit with the person, talk, or pray. When the family is ready, the following steps can be taken: 1. Place your loved one on his or her back with one pillow under the head. If necessary, you may wish to put your loved one's dentures or other artificial parts in place. 2. If your loved one is in a hospice program, follow the guidelines provided by the program. You can request a hospice nurse to verify the person's death. 3. Contact the appropriate authorities in accordance with local regulations. If your loved one has requested not to be resuscitated through a Do Not Resuscitate (DNR) order or other mechanism, do not call 911. 4. Contact your loved one's health care provider and funeral home. 5. Contact other family members, friends, and clergy who may not be aware of your loved one's passing. 6. Obtain emotional support to cope with the loss of your loved one. FOR MORE INFORMATION   National Hospice and Palliative Care Organization: http://case.info/.EmploymentAssurance.cz  National Institute on Aging: http://kim-miller.com/ Document Released: 09/10/2008 Document Revised: 04/18/2014 Document Reviewed: 05/23/2013 [facility] Patient Information 2015 [place], [place]. This information is not intended to replace advice given to you by your health care provider.  Make sure you discuss any questions you have with your health care provider.

## 2015-03-08 ENCOUNTER — Encounter: Payer: Self-pay | Admitting: Internal Medicine

## 2015-03-08 ENCOUNTER — Ambulatory Visit (INDEPENDENT_AMBULATORY_CARE_PROVIDER_SITE_OTHER): Payer: BLUE CROSS/BLUE SHIELD | Admitting: Internal Medicine

## 2015-03-08 VITALS — BP 140/80 | HR 73 | Temp 97.8°F | Resp 20 | Ht 65.0 in | Wt 215.0 lb

## 2015-03-08 DIAGNOSIS — R197 Diarrhea, unspecified: Secondary | ICD-10-CM | POA: Diagnosis not present

## 2015-03-08 DIAGNOSIS — I10 Essential (primary) hypertension: Secondary | ICD-10-CM | POA: Diagnosis not present

## 2015-03-08 NOTE — Patient Instructions (Signed)
Avoid dairy products and lactose rich foods High-fiber diet  Call or return to clinic prn if these symptoms worsen or fail to improve as anticipated.

## 2015-03-08 NOTE — Progress Notes (Signed)
Pre visit review using our clinic review tool, if applicable. No additional management support is needed unless otherwise documented below in the visit note. 

## 2015-03-08 NOTE — Progress Notes (Signed)
Subjective:    Patient ID: [name], female    DOB: 1956/03/26, 59 y.o.   MRN: [id number]  HPI  59 year old patient who has a history of essential hypertension.  For the past 2 days, she has had liquidy, profuse diarrhea following breakfast.  2 days ago and lunch yesterday.  No nausea, vomiting or abdominal pain.  Generally feels well. She states that she had similar symptoms a couple weeks ago briefly.  No new medications. Other complaints include mild cough.  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  Past Medical History  Diagnosis Date  . Hypertension   . Pulmonary nodule, left   . Obesity   . Sarcoidosis   . GERD (gastroesophageal reflux disease)   . Rectal cancer   . Cleft lip     repaired   . Bifid uvula     History   Social History  . Marital Status: Single    Spouse Name: N/A  . Number of Children: N/A  . 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 Smoking status: Former Smoker -- 0.50 packs/day for 1 years    Types: Cigarettes    Quit date: 12/16/1993  . Smokeless tobacco: Never Used     Comment: smoked breifly while in college  . Alcohol Use: Yes  . Drug Use: No  . Sexual Activity: Not on file   Other Topics Concern  . Not on file   Social History Narrative    No past surgical history on file.  Family History  Problem Relation Age of Onset  . Allergies Mother   . Allergies Sister   . Allergies Brother   . Asthma Mother   . Sarcoidosis Neg Hx   . Asthma Brother   . Asthma Sister     Allergies  Allergen Reactions  . Penicillins Hives    Current Outpatient Prescriptions on File Prior to Visit  Medication Sig Dispense Refill  . albuterol (PROAIR HFA) 108 (90 BASE) MCG/ACT inhaler Inhale 2 puffs into the lungs every 6 (six) hours as needed for wheezing or shortness of breath.    . Amlodipine-Valsartan-HCTZ (EXFORGE HCT) 10-320-25 MG TABS Take 1 tablet by mouth daily. 30  tablet 3  . clonazePAM (KLONOPIN) 1 MG tablet Take 1 tablet (1 mg total) by mouth 2 (two) times daily as needed for anxiety. 20 tablet 1  . famotidine (PEPCID) 20 MG tablet Take 20 mg by mouth at bedtime.    Marland Kitchen HYDROcodone-acetaminophen (NORCO) 10-325 MG per tablet Take 1 tablet by mouth every 8 (eight) hours as needed. 20 tablet 0  . metoprolol succinate (TOPROL-XL) 100 MG 24 hr tablet Take 2 tablets (200 mg total) by mouth daily. Take with or immediately following a meal. 90 tablet 3  . Naphazoline-Pheniramine (EYE ALLERGY RELIEF) 0.027-0.315 % SOLN Apply 1 drop to eye.    . naproxen (NAPROSYN) 500 MG tablet Take 1 tablet (500 mg total) by mouth 2 (two) times daily with a meal. 30 tablet 0  . Naproxen Sodium (ALEVE PO) Take 2 tablets by mouth daily as needed (pain).    . RABEprazole (ACIPHEX) 20 MG tablet Take 20 mg by mouth daily.    . Triamcinolone Acetonide (TRIAMCINOLONE 0.1 % CREAM : EUCERIN) CREA Apply 1 application topically 2 (two) times daily. 60 each 1  . valACYclovir (VALTREX) 500 MG tablet Take 1 tablet (500 mg total) by mouth once. 90 tablet 1  No current facility-administered medications on file prior to visit.    BP 140/80 mmHg  Pulse 73  Temp(Src) 97.8 F (36.6 C) (Oral)  Resp 20  Ht 5\' 5"  (1.651 m)  Wt 215 lb (97.523 kg)  BMI 35.78 kg/m2  SpO2 96%     Review of Systems  Constitutional: Negative.   HENT: Negative for congestion, dental problem, hearing loss, rhinorrhea, sinus pressure, sore throat and tinnitus.   Eyes: Negative for pain, discharge and visual disturbance.  Respiratory: Negative for cough and shortness of breath.   Cardiovascular: Negative for chest pain, palpitations and leg swelling.  Gastrointestinal: Positive for diarrhea. Negative for nausea, vomiting, abdominal pain, constipation, blood in stool and abdominal distention.  Genitourinary: Negative for dysuria, urgency, frequency, hematuria, flank pain, vaginal bleeding, vaginal discharge,  difficulty urinating, vaginal pain and pelvic pain.  Musculoskeletal: Negative for joint swelling, arthralgias and gait problem.  Skin: Negative for rash.  Neurological: Negative for dizziness, syncope, speech difficulty, weakness, numbness and headaches.  Hematological: Negative for adenopathy.  Psychiatric/Behavioral: Negative for behavioral problems, dysphoric mood and agitation. The patient is not nervous/anxious.        Objective:   Physical Exam  Constitutional: She is oriented to person, place, and time. She appears well-developed and well-nourished.  HENT:  Head: Normocephalic.  Right Ear: External ear normal.  Left Ear: External ear normal.  Mouth/Throat: Oropharynx is clear and moist.  Eyes: Conjunctivae and EOM are normal. Pupils are equal, round, and reactive to light.  Neck: Normal range of motion. Neck supple. No thyromegaly present.  Cardiovascular: Normal rate, regular rhythm, normal heart sounds and intact distal pulses.   Pulmonary/Chest: Effort normal and breath sounds normal.  Abdominal: Soft. Bowel sounds are normal. She exhibits no mass. There is no tenderness. There is no rebound and no guarding.  Musculoskeletal: Normal range of motion.  Lymphadenopathy:    She has no cervical adenopathy.  Neurological: She is alert and oriented to person, place, and time.  Skin: Skin is warm and dry. No rash noted.  Psychiatric: She has a normal mood and affect. Her behavior is normal.          Assessment & Plan:   Hypertension, well-controlled Episodic diarrhea.  Will observe at this point.  We'll avoid the lactose rich foods, and adhere to a high fiber diet.  Will call if symptoms worsen or fail to improve

## 2015-03-09 ENCOUNTER — Ambulatory Visit: Payer: BLUE CROSS/BLUE SHIELD | Admitting: Family Medicine

## 2015-03-17 ENCOUNTER — Telehealth: Payer: Self-pay | Admitting: Internal Medicine

## 2015-03-17 MED ORDER — PREDNISONE 10 MG PO TABS
ORAL_TABLET | ORAL | Status: DC
Start: 2015-03-17 — End: 2015-03-24

## 2015-03-17 MED ORDER — AZITHROMYCIN 250 MG PO TABS: 250.0000 mg | ORAL_TABLET | ORAL | Status: DC

## 2015-03-17 NOTE — Telephone Encounter (Signed)
Spoke with pt, c/o increased sob, prod cough since Sunday.  Denies congestion, fever, chest pain/tightness.  Pt has been taking tylenol cold and flu cough syrup.   Pt recommending recs and a cough syrup.  Pt states she can pick up an rx if needed.  Pt uses [facility] on [address].   Sending to doc of day as MW is out of the office. MR please advise.  Thanks!  Allergies  Allergen Reactions  . Penicillins Hives

## 2015-03-17 NOTE — Telephone Encounter (Signed)
Possible sarcoid flare  Please take prednisone 40 mg x1 day, then 30 mg x1 day, then 20 mg x1 day, then 10 mg x1 day, and then 5 mg x1 day and stop IF sputum or things get worse - then add ZPAK   Dr. Brand Males, M.D., [facility].C.P Pulmonary and Critical Care Medicine Staff Physician [place] Pager: [phone], If no answer or between  15:00h - 7:00h: call [phone]  03/17/2015 4:09 PM       Current outpatient prescriptions:  .  albuterol (PROAIR HFA) 108 (90 BASE) MCG/ACT inhaler, Inhale 2 puffs into the lungs every 6 (six) hours as needed for wheezing or shortness of breath., Disp: , Rfl:  .  Amlodipine-Valsartan-HCTZ (EXFORGE HCT) 10-320-25 MG TABS, Take 1 tablet by mouth daily., Disp: 30 tablet, Rfl: 3 .  clonazePAM (KLONOPIN) 1 MG tablet, Take 1 tablet (1 mg total) by mouth 2 (two) times daily as needed for anxiety., Disp: 20 tablet, Rfl: 1 .  famotidine (PEPCID) 20 MG tablet, Take 20 mg by mouth at bedtime., Disp: , Rfl:  .  HYDROcodone-acetaminophen (NORCO) 10-325 MG per tablet, Take 1 tablet by mouth every 8 (eight) hours as needed., Disp: 20 tablet, Rfl: 0 .  metoprolol succinate (TOPROL-XL) 100 MG 24 hr tablet, Take 2 tablets (200 mg total) by mouth daily. Take with or immediately following a meal., Disp: 90 tablet, Rfl: 3 .  Naphazoline-Pheniramine (EYE ALLERGY RELIEF) 0.027-0.315 % SOLN, Apply 1 drop to eye., Disp: , Rfl:  .  naproxen (NAPROSYN) 500 MG tablet, Take 1 tablet (500 mg total) by mouth 2 (two) times daily with a meal., Disp: 30 tablet, Rfl: 0 .  Naproxen Sodium (ALEVE PO), Take 2 tablets by mouth daily as needed (pain)., Disp: , Rfl:  .  RABEprazole (ACIPHEX) 20 MG tablet, Take 20 mg by mouth daily., Disp: , Rfl:  .  Triamcinolone Acetonide (TRIAMCINOLONE 0.1 % CREAM : EUCERIN) CREA, Apply 1 application topically 2 (two) times daily., Disp: 60 each, Rfl: 1 .  valACYclovir (VALTREX) 500 MG tablet, Take 1 tablet  (500 mg total) by mouth once., Disp: 90 tablet, Rfl: 1

## 2015-03-17 NOTE — Telephone Encounter (Signed)
PT WAITING IN LOBBY - She was in the area so she came by to wait for this.

## 2015-03-17 NOTE — Telephone Encounter (Signed)
Spoke with the pt here in the lobby and notified of recs per MR  She verbalized understanding and rxs was sent to pharm  Nothing further needed

## 2015-03-24 ENCOUNTER — Ambulatory Visit (INDEPENDENT_AMBULATORY_CARE_PROVIDER_SITE_OTHER)
Admission: RE | Admit: 2015-03-24 | Discharge: 2015-03-24 | Disposition: A | Discharge status: Home / Self Care | Payer: BLUE CROSS/BLUE SHIELD | Source: Ambulatory Visit | Attending: Internal Medicine | Admitting: Internal Medicine

## 2015-03-24 ENCOUNTER — Encounter: Payer: Self-pay | Admitting: Internal Medicine

## 2015-03-24 ENCOUNTER — Ambulatory Visit (INDEPENDENT_AMBULATORY_CARE_PROVIDER_SITE_OTHER): Payer: BLUE CROSS/BLUE SHIELD | Admitting: Internal Medicine

## 2015-03-24 ENCOUNTER — Telehealth: Payer: Self-pay | Admitting: Internal Medicine

## 2015-03-24 VITALS — BP 168/98 | HR 82 | Temp 98.0°F | Ht 65.5 in | Wt 216.0 lb

## 2015-03-24 DIAGNOSIS — I10 Essential (primary) hypertension: Secondary | ICD-10-CM

## 2015-03-24 DIAGNOSIS — D869 Sarcoidosis, unspecified: Secondary | ICD-10-CM

## 2015-03-24 DIAGNOSIS — R05 Cough: Secondary | ICD-10-CM | POA: Diagnosis not present

## 2015-03-24 DIAGNOSIS — R059 Cough, unspecified: Secondary | ICD-10-CM

## 2015-03-24 MED ORDER — BISOPROLOL FUMARATE 10 MG PO TABS: 10.0000 mg | ORAL_TABLET | Freq: Every day | ORAL | Status: DC

## 2015-03-24 MED ORDER — HYDROCODONE-ACETAMINOPHEN 10-325 MG PO TABS: 1.0000 | ORAL_TABLET | ORAL | Status: DC | PRN

## 2015-03-24 NOTE — Patient Instructions (Addendum)
Aciphex 20 mg  Take 30-60 min before first meal of the day and Pepcid 20 mg one bedtime until return to office - this is the best way to tell whether stomach acid is contributing to your problem.    Stop toprol   Start bisoprolol 10 mg daily   Please remember to go to the x-ray department downstairs for your tests - we will call you with the results when they are available.     See [name] w/in 2 weeks with all your medications, even over the counter meds, separated in two separate bags, the ones you take no matter what vs the ones you stop once you feel better and take only as needed when you feel you need them.   [name]  will generate for you a new user friendly medication calendar that will put [place] all on the same page re: your medication use.     Without this process, it simply isn't possible to assure that we are providing  your outpatient care  with  the attention to detail we feel you deserve.   If we cannot assure that you're getting that kind of care,  then we cannot manage your problem effectively from this clinic.  Once you have seen [name] and we are sure that we're all on the same page with your medication use she will arrange follow up with me.

## 2015-03-24 NOTE — Telephone Encounter (Signed)
lmtcb x1 for pt. 

## 2015-03-24 NOTE — Telephone Encounter (Signed)
Pt called back. MW states needs to see pt this p.m.  I checked with Janett Billow & she advised to add pt at 4:00. MW schedule is full & pt would be double booked anywhere. I advised pt to be here at 4:00/spm

## 2015-03-24 NOTE — Telephone Encounter (Signed)
Spoke with pt. States that she is not feeling any better from when she called back last Friday. Reports cough is still present after taking prednisone taper and Zpack. Would like MW's recommendations.  MW - please advise. Thanks.

## 2015-03-24 NOTE — Telephone Encounter (Signed)
Pt is scheduled with Dr Melvyn Novas today at [place] Pt aware and will arrive then.  Nothing further needed.

## 2015-03-24 NOTE — Progress Notes (Signed)
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  Last chronic prednisone 04/15/08 with no flare cough no nodules (right leg and left hand and right arm)  Last ov 02/17/13 no evidence active sarcoid rec Aciphex 20 mg Take 30-60 min before first meal of the day also when coughing for any reason take Pepcid 20 mg one at bedtime  Take delsym two tsp every 12 hours and supplement if needed with  tramadol 50 mg up to 2 every 4 hours to suppress the urge to cough. Swallowing water or using ice chips/non mint and menthol containing candies (such as lifesavers or sugarless jolly ranchers) are also effective.  You should rest your voice and avoid activities that you know make you cough. Once you have eliminated the cough for 3 straight days try reducing the tramadol first,  then the delsym as tolerated.   GERD diet        01/28/2014  Pulmonary consultation / [name]/ on no maint resp rx / rare baseline need for saba  Chief Complaint  Patient presents with  . Pulmonary Consult    Referred per Dr. Posey Pronto. [name] Cough is prod with moderate yellow sputum. She is using proair approx tid.  Onset was Jan 01 2014 while on aciphex with bfast with flu like illness but not follow contingency rx  On prednisone taper/ sp tamiflu/cough meds  Not limited by breathing from desired activities   rec Aciphex 20 mg Take 30-60 min before first meal of the day also when coughing for any reason take Pepcid 20 mg one at  bedtime and chlortrimeton 4 mg at bedtime Take delsym two tsp every 12 hours and supplement if needed with  tramadol 50 mg up to 2 every 4 hours to suppress the urge to cough.   Once you have eliminated the cough for 3 straight days try reducing the tramadol first,  then the delsym as tolerated.   GERD diet  F/u prn    03/24/2015 acute  ov/[name] re:  ? Uri/ no better p zpak and pred on no maint rx and not on gerd rx consisttently  Chief Complaint  Patient presents with  . Acute Visit    Pt c/o increased cough and SOB for the past 10 days. Cough is non prod.       says had been all better until 10 day prior to OV  And at baseline Not limited by breathing from desired activities  / cough is dry and day > noct, peaking in late afternoon and early evening   No obvious patterns in day to day or daytime variabilty of symptoms or assoc chronic cough or cp or chest tightness, subjective wheeze overt sinus or hb symptoms. No unusual exp hx or h/o childhood pna/ asthma or knowledge of premature birth.  Sleeping ok without nocturnal  or early am exacerbation  of respiratory  c/o's or need for noct saba. Also denies any obvious fluctuation of symptoms with weather or environmental changes or other aggravating or  alleviating factors except as outlined above   Current Medications, Allergies, Complete Past Medical History, Past Surgical History, Family History, and Social History were reviewed in [employer] record.  ROS  The following are not active complaints unless bolded sore throat, dysphagia, dental problems, itching, sneezing,  nasal congestion or excess/ purulent secretions, ear ache,   fever, chills, sweats, unintended wt loss, pleuritic or exertional cp, hemoptysis,  orthopnea pnd or leg swelling, presyncope, palpitations, heartburn, abdominal pain, anorexia, nausea, vomiting, diarrhea  or change in bowel or urinary habits, change in stools or urine, dysuria,hematuria,  rash,  arthralgias, visual complaints, headache, numbness weakness or ataxia or problems with walking or coordination,  change in mood/affect or memory.                  Objective:   Physical Exam  05/31/2014      220 > 03/24/2015  216  Wt Readings from Last 3 Encounters:  01/28/14 210 lb 12.8 oz (95.618 kg)  01/24/14 213 lb (96.616 kg)  01/10/14 208 lb (94.348 kg)     amb bf belle affect/ nad  HEENT: nl dentition, turbinates, and orophanx. Nl external ear canals without cough reflex   NECK :  without JVD/Nodes/TM/ nl carotid upstrokes bilaterally   LUNGS: no acc muscle use, clear to A and P bilaterally without cough on insp or exp maneuvers   CV:  RRR  no s3 or murmur or increase in P2, no edema   ABD:  soft and nontender with nl excursion in the supine position. No bruits or organomegaly, bowel sounds nl  MS:  warm without deformities, calf tenderness, cyanosis or clubbing  SKIN: warm and dry without lesions  - minimal sarcoid changes at junction of nose and   upper lip  NEURO:  alert, approp, no deficits    CXR PA and Lateral:   03/24/2015 :     I personally reviewed images and agree with radiology impression as follows:     Stable bibasilar interstitial densities as well as mild bilateral hilar prominence compared to prior exam consistent with sarcoidosis. No significant changes noted.          Assessment & Plan:

## 2015-03-24 NOTE — Telephone Encounter (Signed)
Will need ov/ ok to add on to end of day

## 2015-03-25 ENCOUNTER — Encounter: Payer: Self-pay | Admitting: Internal Medicine

## 2015-03-25 NOTE — Assessment & Plan Note (Signed)
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  - Chronic pred rx 2/09> 04/2008  - Restart Pred December 26, 2008 > June 12, 2009 stopped  > restarted 07/11/11>>pt stopped 10/15/11  - PFT's March 23, 2010 VC 1.77 (66%) no obst, ERV 34%, DLCO 53 > 115% corrected - PFT's 09/30/12       VC 2.02 (57%) ratio 78 , erv 13%  DLCO 55 > 118% corrected - Skin bx 05/19/14 bridge of nose > Pos NCG  She does great for long periods of time in fact so well she forgets her contingency recs (see below) but there is no indication of active sarcoid at present  Nor does it explain her acute intermittent symptoms so does not need to be back on systemic steroids

## 2015-03-25 NOTE — Assessment & Plan Note (Signed)
Elevation noted, advised no salt, no prednisone, f/u in 2 weeks on a trial of bisoprolol 10 mg daily in case any of her flares are related to high dose toprol (with spillover B2 contributing to cough and sob_     Each maintenance medication was reviewed in detail including most importantly the difference between maintenance and as needed and under what circumstances the prns are to be used.  Please see instructions for details which were reviewed in writing and the patient given a copy.

## 2015-03-25 NOTE — Assessment & Plan Note (Signed)
This is most c/w   Classic Upper airway cough syndrome, so named because it's frequently impossible to sort out how much is  CR/sinusitis with freq throat clearing (which can be related to primary GERD)   vs  causing  secondary (" extra esophageal")  GERD from wide swings in gastric pressure that occur with throat clearing, often  promoting self use of mint and menthol lozenges that reduce the lower esophageal sphincter tone and exacerbate the problem further in a cyclical fashion.   These are the same pts (now being labeled as having "irritable larynx syndrome" by some cough centers) who not infrequently have a history of having failed to tolerate ace inhibitors,  dry powder inhalers or biphosphonates or report having atypical reflux symptoms that don't respond to standard doses of PPI , and are easily confused as having aecopd or asthma flares by even experienced allergists/ pulmonologists.   Again Explained the natural history of uri and why it's necessary in patients at risk to treat GERD aggressively - at least  short term -   to reduce risk of evolving cyclical cough initially  triggered by epithelial injury and a heightened sensitivty to the effects of any upper airway irritants,  most importantly acid - related - then perpetuated by epithelial injury related to the cough itself as the upper airway collapses on itself.  That is, the more sensitive the epithelium becomes once it is damaged by the virus, the more the ensuing irritability> the more the cough, the more the secondary reflux (especially in those prone to reflux) the more the irritation of the sensitive mucosa and so on in a  Classic cyclical pattern.    Would like her to return for med reconciliation and will give her a very specific set of  "action plans" she can use going forward and if not effective insist that she [place] if at all possible for flares.

## 2015-03-27 ENCOUNTER — Ambulatory Visit: Payer: BLUE CROSS/BLUE SHIELD | Admitting: Internal Medicine

## 2015-03-27 NOTE — Progress Notes (Signed)
Quick Note:  LMTCB ______ 

## 2015-03-30 NOTE — Progress Notes (Signed)
Quick Note:  Spoke with pt and notified of results per Dr. Wert. Pt verbalized understanding and denied any questions.  ______ 

## 2015-04-07 ENCOUNTER — Ambulatory Visit (INDEPENDENT_AMBULATORY_CARE_PROVIDER_SITE_OTHER): Payer: BLUE CROSS/BLUE SHIELD | Admitting: Adult Health

## 2015-04-07 ENCOUNTER — Encounter: Payer: Self-pay | Admitting: Adult Health

## 2015-04-07 VITALS — BP 134/76 | HR 73 | Temp 97.8°F | Ht 65.0 in | Wt 219.6 lb

## 2015-04-07 DIAGNOSIS — D869 Sarcoidosis, unspecified: Secondary | ICD-10-CM | POA: Diagnosis not present

## 2015-04-07 DIAGNOSIS — R059 Cough, unspecified: Secondary | ICD-10-CM

## 2015-04-07 DIAGNOSIS — R05 Cough: Secondary | ICD-10-CM | POA: Diagnosis not present

## 2015-04-07 NOTE — Assessment & Plan Note (Addendum)
CXR stable last ov  Cough is resolved with tx aimed at GERD/AR  Has some lingering dyspnea.  Hold on steroids for now  Patient's medications were reviewed today and patient education was given. Computerized medication calendar was adjusted/completed  May need repeat spirometry /PFT if remains symptomatic with dyspnea.   Plan  Continue on current regimen.  Follow med calendar closely and bring to each visit.  Please contact office for sooner follow up if symptoms do not improve or worsen or seek emergency care  follow up Dr. Melvyn Novas  In 6 weeks and As needed

## 2015-04-07 NOTE — Patient Instructions (Signed)
Continue on current regimen.  Follow med calendar closely and bring to each visit.  Please contact office for sooner follow up if symptoms do not improve or worsen or seek emergency care  follow up Dr. Melvyn Novas  In 6 weeks and As needed

## 2015-04-07 NOTE — Progress Notes (Signed)
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  Last chronic prednisone 04/15/08 with no flare cough no nodules (right leg and left hand and right arm)  Last ov 02/17/13 no evidence active sarcoid rec Aciphex 20 mg Take 30-60 min before first meal of the day also when coughing for any reason take Pepcid 20 mg one at bedtime  Take delsym two tsp every 12 hours and supplement if needed with  tramadol 50 mg up to 2 every 4 hours to suppress the urge to cough. Swallowing water or using ice chips/non mint and menthol containing candies (such as lifesavers or sugarless jolly ranchers) are also effective.  You should rest your voice and avoid activities that you know make you cough. Once you have eliminated the cough for 3 straight days try reducing the tramadol first,  then the delsym as tolerated.   GERD diet    01/28/2014  Pulmonary consultation / Wert/ on no maint resp rx / rare baseline need for saba  Chief Complaint  Patient presents with  . Pulmonary Consult    Referred per Dr. Posey Pronto. [name] Cough is prod with moderate yellow sputum. She is using proair approx tid.  Onset was Jan 01 2014 while on aciphex with bfast with flu like illness but not follow contingency rx  On prednisone taper/ sp tamiflu/cough meds  Not limited by breathing from desired activities   rec Aciphex 20 mg Take 30-60 min before first meal of the day also when coughing for any reason take Pepcid 20 mg one at bedtime and  chlortrimeton 4 mg at bedtime Take delsym two tsp every 12 hours and supplement if needed with  tramadol 50 mg up to 2 every 4 hours to suppress the urge to cough.   Once you have eliminated the cough for 3 straight days try reducing the tramadol first,  then the delsym as tolerated.   GERD diet  F/u prn    03/24/2015 acute  ov/Wert re:  ? Uri/ no better p zpak and pred on no maint rx and not on gerd rx consisttently  Chief Complaint  Patient presents with  . Acute Visit    Pt c/o increased cough and SOB for the past 10 days. Cough is non prod.       says had been all better until 10 day prior to OV  And at baseline Not limited by breathing from desired activities  / cough is dry and day > noct, peaking in late afternoon and early evening  >>changed from toprol to bisoprolol   04/07/2015 Follow up : Sarcoid /cough  Patient returns for a two-week follow-up and medication review We reviewed all her medications organize them into a medication count with patient education Appears to be taking her medications correctly Last visit was seen for cough and shortness of breath despite being given antibiotic and prednisone taper. Patient was changed from Toprol to bisoprolol. She does feel that her cough is improved, however, she does have some lingering dyspnea.  Says she gets winded at times. She denies any chest pain, orthopnea, PND, leg swelling , calf pain or edema. No visual changes, rash, joint swelling. Chest x-ray last visit showed stable changes.  Current Medications, Allergies, Complete Past Medical History, Past Surgical History, Family History, and Social History were reviewed in [facility] record.  ROS  The following are not active complaints unless bolded sore throat, dysphagia, dental problems, itching, sneezing,  nasal congestion or excess/ purulent secretions, ear ache,   fever, chills, sweats, unintended wt loss, pleuritic or exertional cp, hemoptysis,   orthopnea pnd or leg swelling, presyncope, palpitations, heartburn, abdominal pain, anorexia, nausea, vomiting, diarrhea  or change in bowel or urinary habits, change in stools or urine, dysuria,hematuria,  rash, arthralgias, visual complaints, headache, numbness weakness or ataxia or problems with walking or coordination,  change in mood/affect or memory.                  Objective:   Physical Exam  NAD   HEENT: nl dentition, turbinates, and orophanx. Nl external ear canals without cough reflex   NECK :  without JVD/Nodes/TM/ nl carotid upstrokes bilaterally   LUNGS: no acc muscle use, clear to A and P bilaterally without cough on insp or exp maneuvers   CV:  RRR  no s3 or murmur or increase in P2, no edema   ABD:  soft and nontender with nl excursion in the supine position. No bruits or organomegaly, bowel sounds nl  MS:  warm without deformities, calf tenderness, cyanosis or clubbing  SKIN: warm and dry without lesions  - minimal sarcoid changes at junction of nose and   upper lip  NEURO:  alert, approp, no deficits    CXR PA and Lateral:   03/24/2015 :       Stable bibasilar interstitial densities as well as mild bilateral hilar prominence compared to prior exam consistent with sarcoidosis. No significant changes noted.          Assessment & Plan:

## 2015-04-07 NOTE — Assessment & Plan Note (Signed)
Improved off BB .   Plan  Continue on current regimen.  Follow med calendar closely and bring to each visit.  Please contact office for sooner follow up if symptoms do not improve or worsen or seek emergency care  follow up Dr. Melvyn Novas  In 6 weeks and As needed

## 2015-04-19 NOTE — Addendum Note (Signed)
Addended by: Inge Rise on: 04/19/2015 11:03 AM   Modules accepted: Orders, Medications

## 2015-05-02 ENCOUNTER — Ambulatory Visit (INDEPENDENT_AMBULATORY_CARE_PROVIDER_SITE_OTHER): Payer: BLUE CROSS/BLUE SHIELD | Admitting: Internal Medicine

## 2015-05-02 ENCOUNTER — Encounter: Payer: Self-pay | Admitting: Internal Medicine

## 2015-05-02 VITALS — BP 130/94 | HR 79 | Ht 65.5 in | Wt 219.0 lb

## 2015-05-02 DIAGNOSIS — R058 Other specified cough: Secondary | ICD-10-CM

## 2015-05-02 DIAGNOSIS — D869 Sarcoidosis, unspecified: Secondary | ICD-10-CM

## 2015-05-02 DIAGNOSIS — I10 Essential (primary) hypertension: Secondary | ICD-10-CM | POA: Diagnosis not present

## 2015-05-02 DIAGNOSIS — R05 Cough: Secondary | ICD-10-CM

## 2015-05-02 MED ORDER — PREDNISONE 10 MG PO TABS: ORAL_TABLET | ORAL | Status: DC

## 2015-05-02 MED ORDER — TRAMADOL HCL 50 MG PO TABS: ORAL_TABLET | ORAL | Status: DC

## 2015-05-02 MED ORDER — FLUTTER DEVI: Status: DC

## 2015-05-02 NOTE — Patient Instructions (Addendum)
When you can take 3 days off:  Prednisone 10 mg take  4 each am x 2 days,   2 each am x 2 days,  1 each am x 2 days and stop   For cough use flutter and Take delsym two tsp every 12 hours and supplement if needed with  tramadol 50 mg up to 2 every 4 hours to suppress the urge to cough. Swallowing water or using ice chips/non mint and menthol or chocolate containing candies (such as lifesavers or sugarless jolly ranchers) are also effective.  You should rest your voice and avoid activities that you know make you cough.  Once you have eliminated the cough for 3 straight days try reducing the tramadol first,  then the delsym as tolerated.    Return with all meds/ flutter and med calendar if not better.

## 2015-05-02 NOTE — Assessment & Plan Note (Signed)
The most common causes of chronic cough in immunocompetent adults include the following: upper airway cough syndrome (UACS), previously referred to as postnasal drip syndrome (PNDS), which is caused by variety of rhinosinus conditions; (2) asthma; (3) GERD; (4) chronic bronchitis from cigarette smoking or other inhaled environmental irritants; (5) nonasthmatic eosinophilic bronchitis; and (6) bronchiectasis.   These conditions, singly or in combination, have accounted for up to 94% of the causes of chronic cough in prospective studies.   Other conditions have constituted no >6% of the causes in prospective studies These have included bronchogenic carcinoma, chronic interstitial pneumonia, sarcoidosis, left ventricular failure, ACEI-induced cough, and aspiration from a condition associated with pharyngeal dysfunction.    Chronic cough is often simultaneously caused by more than one condition. A single cause has been found from 38 to 82% of the time, multiple causes from 18 to 62%. Multiply caused cough has been the result of three diseases up to 42% of the time.       Based on hx and exam, this is most likely:  Classic Upper airway cough syndrome, so named because it's frequently impossible to sort out how much is  CR/sinusitis with freq throat clearing (which can be related to primary GERD)   vs  causing  secondary (" extra esophageal")  GERD from wide swings in gastric pressure that occur with throat clearing, often  promoting self use of mint and menthol lozenges that reduce the lower esophageal sphincter tone and exacerbate the problem further in a cyclical fashion.   These are the same pts (now being labeled as having "irritable larynx syndrome" by some cough centers) who not infrequently have a history of having failed to tolerate ace inhibitors,  dry powder inhalers or biphosphonates or report having atypical reflux symptoms that don't respond to standard doses of PPI , and are easily confused as  having aecopd or asthma flares by even experienced allergists/ pulmonologists.   The first step is to maximize acid suppression and eliminate cyclical coughing with tramadol/ flutter and pred x 6 days  then regroup if the cough persists.  I had an extended discussion with the patient reviewing all relevant studies completed to date and  lasting 15 to 20 minutes of a 25 minute visit on the following ongoing concerns:    Each maintenance medication was reviewed in detail including most importantly the difference between maintenance and as needed and under what circumstances the prns are to be used. This was done in the context of a medication calendar review which provided the patient with a user-friendly unambiguous mechanism for medication administration and reconciliation and provides an action plan for all active problems. It is critical that this be shown to every doctor  for modification during the office visit if necessary so the patient can use it as a working document.

## 2015-05-02 NOTE — Progress Notes (Signed)
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  Last chronic prednisone 04/15/08 with no flare cough no nodules (right leg and left hand and right arm)   History of Present Illness  Last ov 02/17/13 no evidence active sarcoid rec Aciphex 20 mg Take 30-60 min before first meal of the day also when coughing for any reason take Pepcid 20 mg one at bedtime  Take delsym two tsp every 12 hours and supplement if needed with  tramadol 50 mg up to 2 every 4 hours to suppress the urge to cough. Swallowing water or using ice chips/non mint and menthol containing candies (such as lifesavers or sugarless jolly ranchers) are also effective.  You should rest your voice and avoid activities that you know make you cough. Once you have eliminated the cough for 3 straight days try reducing the tramadol first,  then the delsym as tolerated.   GERD diet    01/28/2014  Pulmonary consultation / [name]/ on no maint resp rx / rare baseline need for saba  Chief Complaint  Patient presents with  . Pulmonary Consult    Referred per Dr. Posey Pronto. [name] Cough is prod with moderate yellow sputum. She is using proair approx tid.  Onset was Jan 01 2014 while on aciphex with bfast with flu like illness but not follow contingency rx  On prednisone taper/ sp tamiflu/cough meds  Not limited by breathing from desired activities   rec Aciphex 20 mg Take 30-60 min before first meal of the day also when coughing for any reason take  Pepcid 20 mg one at bedtime and chlortrimeton 4 mg at bedtime Take delsym two tsp every 12 hours and supplement if needed with  tramadol 50 mg up to 2 every 4 hours to suppress the urge to cough.   Once you have eliminated the cough for 3 straight days try reducing the tramadol first,  then the delsym as tolerated.   GERD diet  F/u prn    03/24/2015 acute  ov/[name] re: around first April 2016   ? Uri/ no better p zpak and pred on no maint rx and not on gerd rx consisttently  Chief Complaint  Patient presents with  . Acute Visit    Pt c/o increased cough and SOB for the past 10 days. Cough is non prod.   says had been all better until 10 day prior to OV  And at baseline Not limited by breathing from desired activities  / cough is dry and day > noct, peaking in late afternoon and early evening  >>changed from toprol to bisoprolol   04/07/2015 Follow up : Sarcoid /cough  Patient returns for a two-week follow-up and medication review We reviewed all her medications organize them into a medication count with patient education Appears to be taking her medications correctly Last visit was seen for cough and shortness of breath despite being given antibiotic and prednisone taper. Patient was changed from Toprol to bisoprolol. She does feel that her cough is improved,  however, she does have some lingering dyspnea. rec Continue on current regimen.  Follow med calendar closely and bring to each visit.    05/02/2015 f/u ov/[name] re: chronic recurrent cough since late March 2016  Chief Complaint  Patient presents with  . Acute Visit    Pt c/o cough and SOB not improving since last visit.  Her cough is non prod. She is using albuterol inhaler 2-3 x per day.   sob at rest only happens when coughing/ seems worse talking Doe x across the warehouse at a nl pace, never sitting, never supine   No obvious day to day or daytime variabilty or assoc excess/ purulent sputum or  cp or chest tightness, subjective  wheeze overt sinus or hb symptoms. No unusual exp hx or h/o childhood pna/ asthma or knowledge of premature birth.  Sleeping ok without nocturnal  or early am exacerbation  of respiratory  c/o's or need for noct saba. Also denies any obvious fluctuation of symptoms with weather or environmental changes or other aggravating or alleviating factors except as outlined above   Current Medications, Allergies, Complete Past Medical History, Past Surgical History, Family History, and Social History were reviewed in [employer] record.  ROS  The following are not active complaints unless bolded sore throat, dysphagia, dental problems, itching, sneezing,  nasal congestion or excess/ purulent secretions, ear ache,   fever, chills, sweats, unintended wt loss, pleuritic or exertional cp, hemoptysis,  orthopnea pnd or leg swelling, presyncope, palpitations, heartburn, abdominal pain, anorexia, nausea, vomiting, diarrhea  or change in bowel or urinary habits, change in stools or urine, dysuria,hematuria,  rash, arthralgias, visual complaints, headache, numbness weakness or ataxia or problems with walking or coordination,  change in mood/affect or memory.             Objective:   Physical Exam   amb obese  bf nad until starts harsh barking cough   Wt Readings from Last 3 Encounters:  05/02/15 219 lb (99.338 kg)  04/07/15 219 lb 9.6 oz (99.61 kg)  03/24/15 216 lb (97.977 kg)    Vital signs reviewed    HEENT: nl dentition, turbinates, and orophanx. Nl external ear canals without cough reflex   NECK :  without JVD/Nodes/TM/ nl carotid upstrokes bilaterally   LUNGS: no acc muscle use, clear to A and P bilaterally without cough on insp or exp maneuvers but prominent pseudowheeze  CV:  RRR  no s3 or murmur or increase in P2, no edema   ABD:  soft and nontender with nl excursion in the supine position. No bruits or organomegaly, bowel sounds nl  MS:  warm without deformities,  calf tenderness, cyanosis or clubbing  SKIN: warm and dry without lesions  - minimal sarcoid changes at junction of nose and   upper lip  NEURO:  alert, approp, no deficits    CXR PA and Lateral:   03/24/2015 :       Stable bibasilar interstitial densities as well as mild bilateral hilar prominence compared to prior exam consistent with sarcoidosis. No significant changes noted.          Assessment & Plan:

## 2015-05-02 NOTE — Assessment & Plan Note (Addendum)
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  - Chronic pred rx 2/09> 04/2008  - Restart Pred December 26, 2008 > June 12, 2009 stopped  > restarted 07/11/11>>pt stopped 10/15/11  - PFT's March 23, 2010 VC 1.77 (66%) no obst, ERV 34%, DLCO 53 > 115% corrected - PFT's 09/30/12       VC 2.02 (57%) ratio 78 , erv 13%  DLCO 55 > 118% corrected - Skin bx 05/19/14 bridge of nose > Pos NCG - 05/02/2015  Walked RA x 3 laps @ 185 ft each stopped due to end of study, no sob/ no desat at nl pace/ no cough provoked       No evidence of recurrent dz > no need for maint prednisone

## 2015-05-02 NOTE — Assessment & Plan Note (Signed)
Not Adequate control on present rx, reviewed > no change in rx needed  For now but needs wt loss and f/u primary care

## 2015-05-02 NOTE — Assessment & Plan Note (Addendum)
Target wt < 180 to get BMI < 30   - reviewed with pt/ defer rx to primary care/ minimize steroid rx .

## 2015-05-19 ENCOUNTER — Ambulatory Visit: Payer: BLUE CROSS/BLUE SHIELD | Admitting: Internal Medicine

## 2015-06-27 ENCOUNTER — Ambulatory Visit (INDEPENDENT_AMBULATORY_CARE_PROVIDER_SITE_OTHER): Payer: BLUE CROSS/BLUE SHIELD | Admitting: Family

## 2015-06-27 ENCOUNTER — Encounter: Payer: Self-pay | Admitting: Family

## 2015-06-27 VITALS — BP 130/84 | Temp 98.1°F | Ht 64.0 in | Wt 216.5 lb

## 2015-06-27 DIAGNOSIS — I10 Essential (primary) hypertension: Secondary | ICD-10-CM

## 2015-06-27 DIAGNOSIS — D869 Sarcoidosis, unspecified: Secondary | ICD-10-CM | POA: Diagnosis not present

## 2015-06-27 DIAGNOSIS — Z0000 Encounter for general adult medical examination without abnormal findings: Secondary | ICD-10-CM | POA: Diagnosis not present

## 2015-06-27 LAB — CBC WITH DIFFERENTIAL/PLATELET
BASOS PCT: 0.2 % (ref 0.0–3.0)
Basophils Absolute: 0 10*3/uL (ref 0.0–0.1)
Eosinophils Absolute: 0.3 10*3/uL (ref 0.0–0.7)
Eosinophils Relative: 4.4 % (ref 0.0–5.0)
HCT: 36.3 % (ref 36.0–46.0)
HEMOGLOBIN: 12.7 g/dL (ref 12.0–15.0)
Lymphocytes Relative: 16.9 % (ref 12.0–46.0)
Lymphs Abs: 1.2 10*3/uL (ref 0.7–4.0)
MCHC: 35 g/dL (ref 30.0–36.0)
MCV: 91.8 fl (ref 78.0–100.0)
MONO ABS: 0.7 10*3/uL (ref 0.1–1.0)
Monocytes Relative: 10.1 % (ref 3.0–12.0)
Neutro Abs: 4.9 10*3/uL (ref 1.4–7.7)
Neutrophils Relative %: 68.4 % (ref 43.0–77.0)
Platelets: 222 10*3/uL (ref 150.0–400.0)
RBC: 3.96 Mil/uL (ref 3.87–5.11)
RDW: 14.1 % (ref 11.5–15.5)
WBC: 7.2 10*3/uL (ref 4.0–10.5)

## 2015-06-27 LAB — TSH: TSH: 2.85 u[IU]/mL (ref 0.35–4.50)

## 2015-06-27 LAB — COMPREHENSIVE METABOLIC PANEL
ALT: 23 U/L (ref 0–35)
AST: 24 U/L (ref 0–37)
Albumin: 4.1 g/dL (ref 3.5–5.2)
Alkaline Phosphatase: 128 U/L — ABNORMAL HIGH (ref 39–117)
BUN: 16 mg/dL (ref 6–23)
CALCIUM: 9.8 mg/dL (ref 8.4–10.5)
CHLORIDE: 101 meq/L (ref 96–112)
CO2: 29 meq/L (ref 19–32)
CREATININE: 0.73 mg/dL (ref 0.40–1.20)
GFR: 105.85 mL/min (ref >60.00)
Glucose, Bld: 101 mg/dL — ABNORMAL HIGH (ref 70–99)
POTASSIUM: 4.1 meq/L (ref 3.5–5.1)
SODIUM: 137 meq/L (ref 135–145)
Total Bilirubin: 0.8 mg/dL (ref 0.2–1.2)
Total Protein: 8.3 g/dL (ref 6.0–8.3)

## 2015-06-27 LAB — POCT URINALYSIS DIPSTICK
Bilirubin, UA: NEGATIVE
Blood, UA: NEGATIVE
Glucose, UA: NEGATIVE
KETONES UA: NEGATIVE
Nitrite, UA: NEGATIVE
PROTEIN UA: NEGATIVE
Spec Grav, UA: 1.02
UROBILINOGEN UA: 0.2
pH, UA: 5.5

## 2015-06-27 LAB — LIPID PANEL
CHOL/HDL RATIO: 7
Cholesterol: 243 mg/dL — ABNORMAL HIGH (ref 0–200)
HDL: 33.6 mg/dL — ABNORMAL LOW (ref >39.00)
NonHDL: 209.4
TRIGLYCERIDES: 259 mg/dL — AB (ref 0.0–149.0)
VLDL: 51.8 mg/dL — ABNORMAL HIGH (ref 0.0–40.0)

## 2015-06-27 LAB — LDL CHOLESTEROL, DIRECT: Direct LDL: 146 mg/dL

## 2015-06-27 MED ORDER — AMLODIPINE BESYLATE-VALSARTAN 5-320 MG PO TABS: 1.0000 | ORAL_TABLET | Freq: Every day | ORAL | Status: DC

## 2015-06-27 MED ORDER — HYDROCHLOROTHIAZIDE 25 MG PO TABS: 25.0000 mg | ORAL_TABLET | Freq: Every day | ORAL | Status: DC

## 2015-06-27 NOTE — Progress Notes (Signed)
Pre visit review using our clinic review tool, if applicable. No additional management support is needed unless otherwise documented below in the visit note. 

## 2015-06-27 NOTE — Patient Instructions (Signed)
Hypertension Hypertension is another name for high blood pressure. High blood pressure forces your heart to work harder to pump blood. A blood pressure reading has two numbers, which includes a higher number over a lower number (example: 110/72). HOME CARE   Have your blood pressure rechecked by your doctor.  Only take medicine as told by your doctor. Follow the directions carefully. The medicine does not work as well if you skip doses. Skipping doses also puts you at risk for problems.  Do not smoke.  Monitor your blood pressure at home as told by your doctor. GET HELP IF:  You think you are having a reaction to the medicine you are taking.  You have repeat headaches or feel dizzy.  You have puffiness (swelling) in your ankles.  You have trouble with your vision. GET HELP RIGHT AWAY IF:   You get a very bad headache and are confused.  You feel weak, numb, or faint.  You get chest or belly (abdominal) pain.  You throw up (vomit).  You cannot breathe very well. MAKE SURE YOU:   Understand these instructions.  Will watch your condition.  Will get help right away if you are not doing well or get worse. Document Released: 05/20/2008 Document Revised: 12/07/2013 Document Reviewed: 09/24/2013 [facility] Patient Information 2015 [place], [place]. This information is not intended to replace advice given to you by your health care provider. Make sure you discuss any questions you have with your health care provider. Exercise to Stay Healthy Exercise helps you become and stay healthy. EXERCISE IDEAS AND TIPS Choose exercises that:  You enjoy.  Fit into your day. You do not need to exercise really hard to be healthy. You can do exercises at a slow or medium level and stay healthy. You can:  Stretch before and after working out.  Try yoga, Pilates, or tai chi.  Lift weights.  Walk fast, swim, jog, run, climb stairs, bicycle, dance, or rollerskate.  Take aerobic  classes. Exercises that burn about 150 calories:  Running 1  miles in 15 minutes.  Playing volleyball for 45 to 60 minutes.  Washing and waxing a car for 45 to 60 minutes.  Playing touch football for 45 minutes.  Walking 1  miles in 35 minutes.  Pushing a stroller 1  miles in 30 minutes.  Playing basketball for 30 minutes.  Raking leaves for 30 minutes.  Bicycling 5 miles in 30 minutes.  Walking 2 miles in 30 minutes.  Dancing for 30 minutes.  Shoveling snow for 15 minutes.  Swimming laps for 20 minutes.  Walking up stairs for 15 minutes.  Bicycling 4 miles in 15 minutes.  Gardening for 30 to 45 minutes.  Jumping rope for 15 minutes.  Washing windows or floors for 45 to 60 minutes. Document Released: 01/04/2011 Document Revised: 02/24/2012 Document Reviewed: 01/04/2011 [facility] Patient Information 2015 [place], [place]. This information is not intended to replace advice given to you by your health care provider. Make sure you discuss any questions you have with your health care provider.

## 2015-06-27 NOTE — Progress Notes (Signed)
Subjective:    Patient ID: [name], female    DOB: 05-10-1956, 59 y.o.   MRN: [id number]  HPI  59 y.o. African American female presents today for a physical exam. She has a history of hypertension, obesity, GERD, and genital herpes. Reports doing well on medications. Takes Exforge HCT 320/10/25. She complains of  Swelling in the upper and lower extremities, stress and fatigue. Her job is Archivist.  Review of Systems  Constitutional: Positive for fatigue.  HENT: Negative.   Eyes: Negative.   Respiratory: Negative.   Cardiovascular: Positive for leg swelling.  Gastrointestinal: Negative.   Endocrine: Negative.   Genitourinary: Negative.   Musculoskeletal: Negative.   Skin: Negative.   Allergic/Immunologic: Negative.   Neurological: Negative.   Hematological: Negative.   Psychiatric/Behavioral: The patient is nervous/anxious.    Past Medical History  Diagnosis Date  . Hypertension   . Pulmonary nodule, left   . Obesity   . Sarcoidosis   . GERD (gastroesophageal reflux disease)   . Rectal cancer   . Cleft lip     repaired   . Bifid uvula     History   Social History  . Marital Status: Single    Spouse Name: N/A  . Number of Children: N/A  . 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 Smoking status: Former Smoker -- 0.50 packs/day for 1 years    Types: Cigarettes    Quit date: 12/16/1993  . Smokeless tobacco: Never Used     Comment: smoked breifly while in college  . Alcohol Use: Yes  . Drug Use: No  . Sexual Activity: Not on file   Other Topics Concern  . Not on file   Social History Narrative    History reviewed. No pertinent past surgical history.  Family History  Problem Relation Age of Onset  . Allergies Mother   . Allergies Sister   . Allergies Brother   . Asthma Mother   . Sarcoidosis Neg Hx   . Asthma Brother   . Asthma Sister     Allergies  Allergen Reactions    . Penicillins Hives    Current Outpatient Prescriptions on File Prior to Visit  Medication Sig Dispense Refill  . albuterol (PROVENTIL HFA;VENTOLIN HFA) 108 (90 BASE) MCG/ACT inhaler Inhale 2 puffs into the lungs every 4 (four) hours as needed for wheezing or shortness of breath.    . bisoprolol (ZEBETA) 10 MG tablet Take 1 tablet (10 mg total) by mouth daily. 30 tablet 11  . famotidine (PEPCID) 20 MG tablet Take 20 mg by mouth at bedtime.    . Lorcaserin HCl 10 MG TABS Take 1 tablet by mouth 2 (two) times daily as needed.    . RABEprazole (ACIPHEX) 20 MG tablet Take 20 mg by mouth daily before breakfast.     . Respiratory Therapy Supplies (FLUTTER) DEVI Use as directed 1 each 0  . valACYclovir (VALTREX) 500 MG tablet Take 1 tablet (500 mg total) by mouth once. (Patient taking differently: Take 500 mg by mouth 2 (two) times daily as needed. ) 90 tablet 1  . clonazePAM (KLONOPIN) 1 MG tablet Take 1 tablet (1 mg total) by mouth 2 (two) times daily as needed for anxiety. (Patient not taking: Reported on 06/27/2015) 20 tablet 1   No current facility-administered medications on file prior to visit.    BP 130/84 mmHg  Temp(Src) 98.1 F (36.7 C) (  Oral)  Ht 5\' 4"  (1.626 m)  Wt 216 lb 8 oz (98.204 kg)  BMI 37.14 kg/m2chart    Objective:   Physical Exam  Constitutional: She is oriented to person, place, and time. She appears well-developed and well-nourished.  Eyes: Conjunctivae and EOM are normal. Pupils are equal, round, and reactive to light.  Neck: Normal range of motion. Neck supple.  Cardiovascular: Normal rate, regular rhythm, normal heart sounds and intact distal pulses.   Pulmonary/Chest: Effort normal and breath sounds normal.  Abdominal: Soft. Bowel sounds are normal.  Genitourinary: Vagina normal and uterus normal.  Musculoskeletal: Normal range of motion.  Neurological: She is alert and oriented to person, place, and time. She has normal reflexes.  Skin: Skin is warm and dry.   Psychiatric: She has a normal mood and affect. Her behavior is normal. Judgment and thought content normal.          Assessment & Plan:  Abygail was seen today for annual exam.  Diagnoses and all orders for this visit:  Annual physical exam Orders: -     CBC with Differential -     TSH -     CMP -     POC Urinalysis Dipstick  Essential hypertension Orders: -     CBC with Differential -     Lipid Panel -     CMP -     POC Urinalysis Dipstick  Sarcoidosis Orders: -     CBC with Differential -     CMP -     POC Urinalysis Dipstick  Morbid obesity Orders: -     CBC with Differential -     Lipid Panel -     CMP -     POC Urinalysis Dipstick  Other orders -     amLODipine-valsartan (EXFORGE) 5-320 MG per tablet; Take 1 tablet by mouth daily. -     hydrochlorothiazide (HYDRODIURIL) 25 MG tablet; Take 1 tablet (25 mg total) by mouth daily.   Change Exforge HCT to Exforge 320/5 once a day. ACT 25 mg once daily. Hopefully reducing Norvasc dosage will help with peripheral edema. Encouraged healthy diet and exercise. See gynecology for Pap and pelvic exam as scheduled later this year. Recheck here in 3-4 weeks.

## 2015-07-24 ENCOUNTER — Telehealth: Payer: Self-pay | Admitting: Family

## 2015-07-24 NOTE — Telephone Encounter (Signed)
[facility] - Client No Name Patient Name: [name] Gender: Female DOB: [birth year] Age: 59 Y 47 M 17 D Return Phone Number: [phone] (Primary), [phone] (Secondary) Address: City/State/Zip: [place] Client [facility] - Client Client Site [place] - Day Physician Roxy Cedar Contact Type Call Call Type Triage / Clinical Caller Name Chase Picket Relationship To Patient Daughter Appointment Disposition EMR Patient Reports Appointment Already Scheduled Info pasted into Epic No Return Phone Number [phone] (Primary) Chief Complaint Rectal Symptoms Initial Comment Caller states mother has big scabs on her rectum area and is in a lot of pain. PreDisposition Call Doctor Nurse Assessment Nurse: Denyse Amass, RN, Benjamine Mola Date/Time Eilene Ghazi Time): 07/24/2015 5:03:16 PM Confirm and document reason for call. If symptomatic, describe symptoms. ---Daughter states patient has big scabs on her rectal area and is having a lot of pain. Patient states she saw the doctor for this about 5-6 months ago and the doctor gave her a cream for it and it did help. Now the sores are back. States history of Herpes. Has the patient traveled out of the country within the last 30 days? ---Not Applicable Does the patient require triage? ---Yes Related visit to physician within the last 2 weeks? ---No Does the PT have any chronic conditions? (i.e. diabetes, asthma, etc.) ---Yes List chronic conditions. ---Genital Herpes Sarcodosis HTN Rectal Cancer GERD Guidelines Guideline Title Affirmed Question Affirmed Notes Nurse Date/Time (Eastern Time) Rash or Redness - Localized Genital area rash Denyse Amass, RN, Benjamine Mola 07/24/2015 5:08:53 PM Disp. Time Eilene Ghazi Time) Disposition Final User 07/24/2015 5:12:05 PM See Physician within 24 Hours Yes [place], RN, Lorin Glass  Understands: Yes Disagree/Comply: Comply PLEASE NOTE: All timestamps contained within this report are represented as [place] Standard Time. CONFIDENTIALTY NOTICE: This fax transmission is intended only for the addressee. It contains information that is legally privileged, confidential or otherwise protected from use or disclosure. If you are not the intended recipient, you are strictly prohibited from reviewing, disclosing, copying using or disseminating any of this information or taking any action in reliance on or regarding this information. If you have received this fax in error, please notify us immediately by telephone so that we can arrange for its return to [place]. Phone: [phone], Toll-Free: [phone], Fax: [phone] Page: 2 of 2 Call Id: [id number] Care Advice Given Per Guideline SEE PHYSICIAN WITHIN 24 HOURS: * IF OFFICE WILL BE OPEN: You need to be examined within the next 24 hours. Call your doctor when the office opens, and make an appointment. * IF OFFICE WILL BE CLOSED AND NO PCP TRIAGE: You need to be examined within the next 24 hours. Go to _________ at your convenience. GENITAL HYGIENE: * Keep your genital area clean. Wash daily with unscented soap (e.g., Dial) and water. * Keep your genital area dry. Wear cotton underwear. CALL BACK IF: * Fever occurs * You become worse. After Care Instructions Given Call Event Type User Date / Time Description Referrals REFERRED TO PCP OFFICE

## 2015-07-25 ENCOUNTER — Encounter: Payer: Self-pay | Admitting: Internal Medicine

## 2015-07-25 ENCOUNTER — Ambulatory Visit (INDEPENDENT_AMBULATORY_CARE_PROVIDER_SITE_OTHER): Payer: BLUE CROSS/BLUE SHIELD | Admitting: Internal Medicine

## 2015-07-25 VITALS — BP 130/90 | HR 74 | Temp 98.4°F

## 2015-07-25 DIAGNOSIS — I10 Essential (primary) hypertension: Secondary | ICD-10-CM | POA: Diagnosis not present

## 2015-07-25 DIAGNOSIS — A630 Anogenital (venereal) warts: Secondary | ICD-10-CM | POA: Diagnosis not present

## 2015-07-25 DIAGNOSIS — E785 Hyperlipidemia, unspecified: Secondary | ICD-10-CM

## 2015-07-25 NOTE — Telephone Encounter (Signed)
Appointment scheduled for this morning.

## 2015-07-25 NOTE — Progress Notes (Signed)
Pre visit review using our clinic review tool, if applicable. No additional management support is needed unless otherwise documented below in the visit note. 

## 2015-07-25 NOTE — Patient Instructions (Addendum)
Fat and Cholesterol Control Diet  Fat and cholesterol levels in your blood and organs are influenced by your diet. High levels of fat and cholesterol may lead to diseases of the heart, small and large blood vessels, gallbladder, liver, and pancreas.  CONTROLLING FAT AND CHOLESTEROL WITH DIET  Although exercise and lifestyle factors are important, your diet is key. That is because certain foods are known to raise cholesterol and others to lower it. The goal is to balance foods for their effect on cholesterol and more importantly, to replace saturated and trans fat with other types of fat, such as monounsaturated fat, polyunsaturated fat, and omega-3 fatty acids.  On average, a person should consume no more than 15 to 17 g of saturated fat daily. Saturated and trans fats are considered "bad" fats, and they will raise LDL cholesterol. Saturated fats are primarily found in animal products such as meats, butter, and cream. However, that does not mean you need to give up all your favorite foods. Today, there are good tasting, low-fat, low-cholesterol substitutes for most of the things you like to eat. Choose low-fat or nonfat alternatives. Choose round or loin cuts of red meat. These types of cuts are lowest in fat and cholesterol. Chicken (without the skin), fish, veal, and ground turkey breast are great choices. Eliminate fatty meats, such as hot dogs and salami. Even shellfish have little or no saturated fat. Have a 3 oz (85 g) portion when you eat lean meat, poultry, or fish.  Trans fats are also called "partially hydrogenated oils." They are oils that have been scientifically manipulated so that they are solid at room temperature resulting in a longer shelf life and improved taste and texture of foods in which they are added. Trans fats are found in stick margarine, some tub margarines, cookies, crackers, and baked goods.   When baking and cooking, oils are a great substitute for butter. The monounsaturated oils are  especially beneficial since it is believed they lower LDL and raise HDL. The oils you should avoid entirely are saturated tropical oils, such as coconut and palm.   Remember to eat a lot from food groups that are naturally free of saturated and trans fat, including fish, fruit, vegetables, beans, grains (barley, rice, couscous, bulgur wheat), and pasta (without cream sauces).   IDENTIFYING FOODS THAT LOWER FAT AND CHOLESTEROL   Soluble fiber may lower your cholesterol. This type of fiber is found in fruits such as apples, vegetables such as broccoli, potatoes, and carrots, legumes such as beans, peas, and lentils, and grains such as barley. Foods fortified with plant sterols (phytosterol) may also lower cholesterol. You should eat at least 2 g per day of these foods for a cholesterol lowering effect.   Read package labels to identify low-saturated fats, trans fat free, and low-fat foods at the supermarket. Select cheeses that have only 2 to 3 g saturated fat per ounce. Use a heart-healthy tub margarine that is free of trans fats or partially hydrogenated oil. When buying baked goods (cookies, crackers), avoid partially hydrogenated oils. Breads and muffins should be made from whole grains (whole-wheat or whole oat flour, instead of "flour" or "enriched flour"). Buy non-creamy canned soups with reduced salt and no added fats.   FOOD PREPARATION TECHNIQUES   Never deep-fry. If you must fry, either stir-fry, which uses very little fat, or use non-stick cooking sprays. When possible, broil, bake, or roast meats, and steam vegetables. Instead of putting butter or margarine on vegetables, use lemon   yogurt, salsa, and low-fat dressings for salads.  LOW-SATURATED FAT / LOW-FAT FOOD SUBSTITUTES Meats / Saturated Fat (g)  Avoid: Steak, marbled (3 oz/85 g) / 11 g  Choose: Steak, lean (3 oz/85 g) / 4 g  Avoid: Hamburger (3 oz/85 g) / 7  g  Choose: Hamburger, lean (3 oz/85 g) / 5 g  Avoid: Ham (3 oz/85 g) / 6 g  Choose: Ham, lean cut (3 oz/85 g) / 2.4 g  Avoid: Chicken, with skin, dark meat (3 oz/85 g) / 4 g  Choose: Chicken, skin removed, dark meat (3 oz/85 g) / 2 g  Avoid: Chicken, with skin, light meat (3 oz/85 g) / 2.5 g  Choose: Chicken, skin removed, light meat (3 oz/85 g) / 1 g Dairy / Saturated Fat (g)  Avoid: Whole milk (1 cup) / 5 g  Choose: Low-fat milk, 2% (1 cup) / 3 g  Choose: Low-fat milk, 1% (1 cup) / 1.5 g  Choose: Skim milk (1 cup) / 0.3 g  Avoid: Hard cheese (1 oz/28 g) / 6 g  Choose: Skim milk cheese (1 oz/28 g) / 2 to 3 g  Avoid: Cottage cheese, 4% fat (1 cup) / 6.5 g  Choose: Low-fat cottage cheese, 1% fat (1 cup) / 1.5 g  Avoid: Ice cream (1 cup) / 9 g  Choose: Sherbet (1 cup) / 2.5 g  Choose: Nonfat frozen yogurt (1 cup) / 0.3 g  Choose: Frozen fruit bar / trace  Avoid: Whipped cream (1 tbs) / 3.5 g  Choose: Nondairy whipped topping (1 tbs) / 1 g Condiments / Saturated Fat (g)  Avoid: Mayonnaise (1 tbs) / 2 g  Choose: Low-fat mayonnaise (1 tbs) / 1 g  Avoid: Butter (1 tbs) / 7 g  Choose: Extra light margarine (1 tbs) / 1 g  Avoid: Coconut oil (1 tbs) / 11.8 g  Choose: Olive oil (1 tbs) / 1.8 g  Choose: Corn oil (1 tbs) / 1.7 g  Choose: Safflower oil (1 tbs) / 1.2 g  Choose: Sunflower oil (1 tbs) / 1.4 g  Choose: Soybean oil (1 tbs) / 2.4 g  Choose: Canola oil (1 tbs) / 1 g Document Released: 12/02/2005 Document Revised: 03/29/2013 Document Reviewed: 03/02/2014 ExitCare Patient Information 2015 [place], [place]. This information is not intended to replace advice given to you by your health care provider. Make sure you discuss any questions you have with your health care provider. Exercise to Stay Healthy Exercise helps you become and stay healthy. EXERCISE IDEAS AND TIPS Choose exercises that:  You enjoy.  Fit into your day. You do not need to exercise  really hard to be healthy. You can do exercises at a slow or medium level and stay healthy. You can:  Stretch before and after working out.  Try yoga, Pilates, or tai chi.  Lift weights.  Walk fast, swim, jog, run, climb stairs, bicycle, dance, or rollerskate.  Take aerobic classes. Exercises that burn about 150 calories:  Running 1  miles in 15 minutes.  Playing volleyball for 45 to 60 minutes.  Washing and waxing a car for 45 to 60 minutes.  Playing touch football for 45 minutes.  Walking 1  miles in 35 minutes.  Pushing a stroller 1  miles in 30 minutes.  Playing basketball for 30 minutes.  Raking leaves for 30 minutes.  Bicycling 5 miles in 30 minutes.  Walking 2 miles in 30 minutes.  Dancing for 30 minutes.  Shoveling snow for 15  minutes.  Swimming laps for 20 minutes.  Walking up stairs for 15 minutes.  Bicycling 4 miles in 15 minutes.  Gardening for 30 to 45 minutes.  Jumping rope for 15 minutes.  Washing windows or floors for 45 to 60 minutes. Document Released: 01/04/2011 Document Revised: 02/24/2012 Document Reviewed: 01/04/2011 [facility] Patient Information 2015 [place], [place]. This information is not intended to replace advice given to you by your health care provider. Make sure you discuss any questions you have with your health care provider.   General surgery follow-up as scheduled

## 2015-07-25 NOTE — Progress Notes (Signed)
Subjective:    Patient ID: [name], female    DOB: [birth year], 59 y.o.   MRN: [id number]  HPI  59 year old patient who is seen today for follow-up of a rash involving the left buttock area.  She states this has been present for about 2 months. Her PCP has prescribed triamcinolone cream as well as oral Valtrex without benefit. Patient does have a prior history of genital herpes Rash has felt irritated and has had occasional bleeding.  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 Hypertension   . Pulmonary nodule, left   . Obesity   . Sarcoidosis   . GERD (gastroesophageal reflux disease)   . Rectal cancer   . Cleft lip     repaired   . Bifid uvula     History   Social History  . Marital Status: Single    Spouse Name: N/A  . Number of Children: N/A  . 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 Smoking status: Former Smoker -- 0.50 packs/day for 1 years    Types: Cigarettes    Quit date: 12/16/1993  . Smokeless tobacco: Never Used     Comment: smoked breifly while in college  . Alcohol Use: Yes  . Drug Use: No  . Sexual Activity: Not on file   Other Topics Concern  . Not on file   Social History Narrative    No past surgical history on file.  Family History  Problem Relation Age of Onset  . Allergies Mother   . Allergies Sister   . Allergies Brother   . Asthma Mother   . Sarcoidosis Neg Hx   . Asthma Brother   . Asthma Sister     Allergies  Allergen Reactions  . Penicillins Hives    Current Outpatient Prescriptions on File Prior to Visit  Medication Sig Dispense Refill  . albuterol (PROVENTIL HFA;VENTOLIN HFA) 108 (90 BASE) MCG/ACT inhaler Inhale 2 puffs into the lungs every 4 (four) hours as needed for wheezing or shortness of breath.    . bisoprolol (ZEBETA) 10 MG tablet Take 1 tablet (10  mg total) by mouth daily. 30 tablet 11  . clonazePAM (KLONOPIN) 1 MG tablet Take 1 tablet (1 mg total) by mouth 2 (two) times daily as needed for anxiety. 20 tablet 1  . famotidine (PEPCID) 20 MG tablet Take 20 mg by mouth at bedtime.    . hydrochlorothiazide (HYDRODIURIL) 25 MG tablet Take 1 tablet (25 mg total) by mouth daily. 30 tablet 3  . Lorcaserin HCl 10 MG TABS Take 1 tablet by mouth 2 (two) times daily as needed.    . RABEprazole (ACIPHEX) 20 MG tablet Take 20 mg by mouth daily before breakfast.     . Respiratory Therapy Supplies (FLUTTER) DEVI Use as directed 1 each 0  . valACYclovir (VALTREX) 500 MG tablet Take 1 tablet (500 mg total) by mouth once. (Patient taking differently: Take 500 mg by mouth 2 (two) times daily as needed. ) 90 tablet 1   No current facility-administered medications on file prior to visit.    BP 130/90 mmHg  Pulse 74  Temp(Src) 98.4 F (36.9 C) (Oral)  Wt   SpO2 96%     Review of Systems  Skin: Positive for rash.  Objective:   Physical Exam  Constitutional: She appears well-developed and well-nourished. No distress.  Skin:  Multiple verrucous papilliform lesions scattered along the inner aspect of the left buttock area          Assessment & Plan:   Lesions.  Left buttock.  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  May require excisional treatment

## 2015-07-27 ENCOUNTER — Telehealth: Payer: Self-pay | Admitting: Family

## 2015-07-27 NOTE — Telephone Encounter (Addendum)
Pt called [facility] b/c she had not heard anything. [facility] states they have not received any OV notes at all from [place]

## 2015-07-28 NOTE — Telephone Encounter (Signed)
Ref faxed to    referral and ov notes to  [facility]  Address: [address], [place], [place] [address]  [phone] Their office will contact pt to schedule directly

## 2015-08-16 ENCOUNTER — Ambulatory Visit: Payer: BLUE CROSS/BLUE SHIELD | Admitting: Family

## 2015-08-22 ENCOUNTER — Encounter: Payer: Self-pay | Admitting: Family

## 2015-08-22 ENCOUNTER — Ambulatory Visit (INDEPENDENT_AMBULATORY_CARE_PROVIDER_SITE_OTHER): Payer: BLUE CROSS/BLUE SHIELD | Admitting: Family

## 2015-08-22 VITALS — BP 200/104 | HR 78 | Temp 98.3°F | Ht 64.0 in | Wt 219.5 lb

## 2015-08-22 DIAGNOSIS — A6000 Herpesviral infection of urogenital system, unspecified: Secondary | ICD-10-CM

## 2015-08-22 MED ORDER — VALACYCLOVIR HCL 500 MG PO TABS: 500.0000 mg | ORAL_TABLET | Freq: Once | ORAL | Status: DC

## 2015-08-22 NOTE — Progress Notes (Signed)
Subjective:    Patient ID: [name], female    DOB: 1956-02-14, 59 y.o.   MRN: [id number]  HPI 59 year old female is in today for a follow-up of genital herpes outbreak to her buttocks. She has a history of anal carcinoma and has an appointment scheduled with the surgeon on 08/22/2015. Reports blood pressure is elevated today got a scary phone call right before this appointment. Blood pressure outside of the office typically once 518 to 841 systolically over 66A.   Review of Systems  Constitutional: Negative.   HENT: Negative.   Respiratory: Negative.   Cardiovascular: Negative.   Gastrointestinal: Negative.   Endocrine: Negative.   Genitourinary: Negative.   Musculoskeletal: Negative.   Skin: Positive for rash.       Buttocks-improving    Allergic/Immunologic: Negative.   Neurological: Negative.   Psychiatric/Behavioral: Negative.    Past Medical History  Diagnosis Date  . Hypertension   . Pulmonary nodule, left   . Obesity   . Sarcoidosis   . GERD (gastroesophageal reflux disease)   . Rectal cancer   . Cleft lip     repaired   . Bifid uvula     Social History   Social History  . Marital Status: Single    Spouse Name: N/A  . Number of Children: N/A  . 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 Smoking status: Former Smoker -- 0.50 packs/day for 1 years    Types: Cigarettes    Quit date: 12/16/1993  . Smokeless tobacco: Never Used     Comment: smoked breifly while in college  . Alcohol Use: Yes  . Drug Use: No  . Sexual Activity: Not on file   Other Topics Concern  . Not on file   Social History Narrative    History reviewed. No pertinent past surgical history.  Family History  Problem Relation Age of Onset  . Allergies Mother   . Allergies Sister   . Allergies Brother   . Asthma Mother   . Sarcoidosis Neg Hx   . Asthma Brother   . Asthma Sister     Allergies  Allergen Reactions   . Penicillins Hives    Current Outpatient Prescriptions on File Prior to Visit  Medication Sig Dispense Refill  . albuterol (PROVENTIL HFA;VENTOLIN HFA) 108 (90 BASE) MCG/ACT inhaler Inhale 2 puffs into the lungs every 4 (four) hours as needed for wheezing or shortness of breath.    . bisoprolol (ZEBETA) 10 MG tablet Take 1 tablet (10 mg total) by mouth daily. 30 tablet 11  . clonazePAM (KLONOPIN) 1 MG tablet Take 1 tablet (1 mg total) by mouth 2 (two) times daily as needed for anxiety. 20 tablet 1  . famotidine (PEPCID) 20 MG tablet Take 20 mg by mouth at bedtime.    . hydrochlorothiazide (HYDRODIURIL) 25 MG tablet Take 1 tablet (25 mg total) by mouth daily. 30 tablet 3  . Lorcaserin HCl 10 MG TABS Take 1 tablet by mouth 2 (two) times daily as needed.    . RABEprazole (ACIPHEX) 20 MG tablet Take 20 mg by mouth daily before breakfast.     . Respiratory Therapy Supplies (FLUTTER) DEVI Use as directed 1 each 0   No current facility-administered medications on file prior to visit.    BP 200/104 mmHg  Pulse 78  Temp(Src) 98.3 F (36.8 C) (Oral)  Ht 5\' 4"  (1.626 m)  Wt  219 lb 8 oz (99.565 kg)  BMI 37.66 kg/m2  SpO2 96%chart     Objective:   Physical Exam  Constitutional: She is oriented to person, place, and time. She appears well-developed and well-nourished.  Neck: Normal range of motion. Neck supple.  Cardiovascular: Normal rate, regular rhythm and normal heart sounds.   Pulmonary/Chest: Effort normal and breath sounds normal.  Musculoskeletal: Normal range of motion.  Neurological: She is alert and oriented to person, place, and time.  Skin: Rash noted. There is erythema.  Psychiatric: She has a normal mood and affect.          Assessment & Plan:  [name] was seen today for follow-up.  Diagnoses and all orders for this visit:  Genital herpes  Other orders -     valACYclovir (VALTREX) 500 MG tablet; Take 1 tablet (500 mg total) by mouth once.   Call the office with  any questions or concerns. Recheck as scheduled. Follow-up with surgeon as scheduled.

## 2015-08-22 NOTE — Progress Notes (Signed)
Pre visit review using our clinic review tool, if applicable. No additional management support is needed unless otherwise documented below in the visit note. 

## 2015-08-22 NOTE — Patient Instructions (Signed)
Genital Herpes °Genital herpes is a sexually transmitted disease. This means that it is a disease passed by having sex with an infected person. There is no cure for genital herpes. The time between attacks can be months to years. The virus may live in a person but produce no problems (symptoms). This infection can be passed to a baby as it travels down the birth canal (vagina). In a newborn, this can cause central nervous system damage, eye damage, or even death. The virus that causes genital herpes is usually HSV-2 virus. The virus that causes oral herpes is usually HSV-1. The diagnosis (learning what is wrong) is made through culture results. °SYMPTOMS  °Usually symptoms of pain and itching begin a few days to a week after contact. It first appears as small blisters that progress to small painful ulcers which then scab over and heal after several days. It affects the outer genitalia, birth canal, cervix, penis, anal area, buttocks, and thighs. °HOME CARE INSTRUCTIONS  °· Keep ulcerated areas dry and clean. °· Take medications as directed. Antiviral medications can speed up healing. They will not prevent recurrences or cure this infection. These medications can also be taken for suppression if there are frequent recurrences. °· While the infection is active, it is contagious. Avoid all sexual contact during active infections. °· Condoms may help prevent spread of the herpes virus. °· Practice safe sex. °· Wash your hands thoroughly after touching the genital area. °· Avoid touching your eyes after touching your genital area. °· Inform your caregiver if you have had genital herpes and become pregnant. It is your responsibility to insure a safe outcome for your baby in this pregnancy. °· Only take over-the-counter or prescription medicines for pain, discomfort, or fever as directed by your caregiver. °SEEK MEDICAL CARE IF:  °· You have a recurrence of this infection. °· You do not respond to medications and are not  improving. °· You have new sources of pain or discharge which have changed from the original infection. °· You have an oral temperature above 102° F (38.9° C). °· You develop abdominal pain. °· You develop eye pain or signs of eye infection. °Document Released: 11/29/2000 Document Revised: 02/24/2012 Document Reviewed: 12/20/2009 °ExitCare® Patient Information ©2015 [employer]. This information is not intended to replace advice given to you by your health care provider. Make sure you discuss any questions you have with your health care provider. ° °

## 2015-08-24 ENCOUNTER — Other Ambulatory Visit: Payer: Self-pay | Admitting: General Surgery

## 2015-09-19 ENCOUNTER — Telehealth: Payer: Self-pay | Admitting: Family

## 2015-09-19 DIAGNOSIS — R0689 Other abnormalities of breathing: Secondary | ICD-10-CM

## 2015-09-19 NOTE — Telephone Encounter (Signed)
Pt call to ask for a referral to see Dr Festus Barren at [facility]  Reason; Pt said she having breathing issues and is going after a second opinion

## 2015-09-20 NOTE — Telephone Encounter (Signed)
Referral placed.

## 2015-09-20 NOTE — Telephone Encounter (Signed)
Ok to refer.

## 2015-09-22 ENCOUNTER — Telehealth: Payer: Self-pay | Admitting: Family

## 2015-09-22 NOTE — Telephone Encounter (Signed)
Patient is aware 

## 2015-09-22 NOTE — Telephone Encounter (Signed)
Patient needs to see [place] before referral can be completed.

## 2015-09-25 ENCOUNTER — Other Ambulatory Visit: Payer: Self-pay | Admitting: Internal Medicine

## 2015-11-08 ENCOUNTER — Other Ambulatory Visit: Payer: Self-pay | Admitting: Obstetrics and Gynecology

## 2015-11-10 LAB — CYTOLOGY - PAP

## 2016-01-17 HISTORY — PX: CARPAL TUNNEL RELEASE: SHX101

## 2016-05-17 ENCOUNTER — Ambulatory Visit (INDEPENDENT_AMBULATORY_CARE_PROVIDER_SITE_OTHER): Payer: Managed Care, Other (non HMO) | Admitting: Internal Medicine

## 2016-05-17 ENCOUNTER — Encounter: Payer: Self-pay | Admitting: Internal Medicine

## 2016-05-17 ENCOUNTER — Ambulatory Visit (INDEPENDENT_AMBULATORY_CARE_PROVIDER_SITE_OTHER)
Admission: RE | Admit: 2016-05-17 | Discharge: 2016-05-17 | Disposition: A | Discharge status: Home / Self Care | Payer: Managed Care, Other (non HMO) | Source: Ambulatory Visit | Attending: Internal Medicine | Admitting: Internal Medicine

## 2016-05-17 VITALS — BP 142/92 | HR 80 | Ht 65.5 in | Wt 215.2 lb

## 2016-05-17 DIAGNOSIS — D869 Sarcoidosis, unspecified: Secondary | ICD-10-CM

## 2016-05-17 DIAGNOSIS — R05 Cough: Secondary | ICD-10-CM

## 2016-05-17 DIAGNOSIS — R058 Other specified cough: Secondary | ICD-10-CM

## 2016-05-17 MED ORDER — AZITHROMYCIN 250 MG PO TABS: ORAL_TABLET | ORAL | Status: DC

## 2016-05-17 MED ORDER — PREDNISONE 10 MG PO TABS: ORAL_TABLET | ORAL | Status: DC

## 2016-05-17 MED ORDER — PANTOPRAZOLE SODIUM 40 MG PO TBEC: 40.0000 mg | DELAYED_RELEASE_TABLET | Freq: Every day | ORAL | Status: DC

## 2016-05-17 NOTE — Patient Instructions (Addendum)
Pantoprazole (protonix) 40 mg   Take  30-60 min before first meal of the day and Pepcid (famotidine)  20 mg one @  bedtime until return to office - this is the best way to tell whether stomach acid is contributing to your problem.    Please remember to go to the   x-ray department downstairs for your tests - we will call you with the results when they are available.  If not improving > zpak/ Prednisone 10 mg take  4 each am x 2 days,   2 each am x 2 days,  1 each am x 2 days and stop    If you are satisfied with your treatment plan,  let your doctor know and he/she can either refill your medications or you can return here when your prescription runs out.     If in any way you are not 100% satisfied,  please tell us.  If 100% better, tell your friends!  Pulmonary follow up is as needed   Late add:  F/u pfts/ cxr in 3 months

## 2016-05-17 NOTE — Assessment & Plan Note (Signed)
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/02/2015 >>> - flare May 2017 p stopped aciphex > rec restart 05/17/2016   Of the three most common causes of chronic cough, only one (GERD)  can actually cause the other two (asthma and post nasal drip syndrome)  and perpetuate the cylce of cough inducing airway trauma, inflammation, heightened sensitivity to reflux which is prompted by the cough itself via a cyclical mechanism.    This may partially respond to steroids and look like asthma and post nasal drainage but never erradicated completely unless the cough and the secondary reflux are eliminated, preferably both at the same time.  While not intuitively obvious, many patients with chronic low grade reflux do not cough until there is a secondary insult that disturbs the protective epithelial barrier and exposes sensitive nerve endings.  This can be viral or direct physical injury such as with an endotracheal tube.   The point is that once this occurs, it is difficult to eliminate using anything but a maximally effective acid suppression regimen at least in the short run, accompanied by an appropriate diet to address non acid GERD.    I had an extended discussion with the patient reviewing all relevant studies completed to date and  lasting 15 to 20 minutes of a 25 minute visit    rec restart max gerd rx with protonix qam ac and pepcid hs/ zpak and pred  6 days if not improving, and f/u prn   Each maintenance medication was reviewed in detail including most importantly the difference between maintenance and prns and under what circumstances the prns are to be triggered using an action plan format that is not reflected in the computer generated alphabetically organized AVS.    Please see instructions for details which were reviewed in writing and the patient given a copy highlighting the part that I personally wrote and discussed at today's ov.

## 2016-05-17 NOTE — Progress Notes (Signed)
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  Last chronic prednisone 04/15/08 with no flare cough no nodules (right leg and left hand and right arm)   History of Present Illness  Last ov 02/17/13 no evidence active sarcoid rec Aciphex 20 mg Take 30-60 min before first meal of the day also when coughing for any reason take Pepcid 20 mg one at bedtime  Take delsym two tsp every 12 hours and supplement if needed with  tramadol 50 mg up to 2 every 4 hours to suppress the urge to cough. Swallowing water or using ice chips/non mint and menthol containing candies (such as lifesavers or sugarless jolly ranchers) are also effective.  You should rest your voice and avoid activities that you know make you cough. Once you have eliminated the cough for 3 straight days try reducing the tramadol first,  then the delsym as tolerated.   GERD diet    01/28/2014  Pulmonary consultation / Wert/ on no maint resp rx / rare baseline need for saba  Chief Complaint  Patient presents with  . Pulmonary Consult    Referred per Dr. Posey Pronto. [name] Cough is prod with moderate yellow sputum. She is using proair approx tid.  Onset was Jan 01 2014 while on aciphex with bfast with flu like illness but not follow contingency rx  On prednisone taper/ sp tamiflu/cough meds  Not limited by breathing from desired activities   rec Aciphex 20 mg Take 30-60 min before first meal of the day also when coughing for any reason take  Pepcid 20 mg one at bedtime and chlortrimeton 4 mg at bedtime Take delsym two tsp every 12 hours and supplement if needed with  tramadol 50 mg up to 2 every 4 hours to suppress the urge to cough.   Once you have eliminated the cough for 3 straight days try reducing the tramadol first,  then the delsym as tolerated.   GERD diet  F/u prn    03/24/2015 acute  ov/Wert re: around first April 2016   ? Uri/ no better p zpak and pred on no maint rx and not on gerd rx consisttently  Chief Complaint  Patient presents with  . Acute Visit    Pt c/o increased cough and SOB for the past 10 days. Cough is non prod.   says had been all better until 10 day prior to OV  And at baseline Not limited by breathing from desired activities  / cough is dry and day > noct, peaking in late afternoon and early evening  >>changed from toprol to bisoprolol   04/07/2015 Follow up : Sarcoid /cough  Patient returns for a two-week follow-up and medication review We reviewed all her medications organize them into a medication count with patient education Appears to be taking her medications correctly Last visit was seen for cough and shortness of breath despite being given antibiotic and prednisone taper. Patient was changed from Toprol to bisoprolol. She does feel that her cough is improved,  however, she does have some lingering dyspnea. rec Continue on current regimen.  Follow med calendar closely and bring to each visit.    05/02/2015 f/u ov/Wert re: chronic recurrent cough since late March 2016  Chief Complaint  Patient presents with  . Acute Visit    Pt c/o cough and SOB not improving since last visit.  Her cough is non prod. She is using albuterol inhaler 2-3 x per day.   sob at rest only happens when coughing/ seems worse talking Doe x across the warehouse at a nl pace, never sitting, never supine  rec When you can take 3 days off: Prednisone 10 mg take  4 each am x 2 days,   2 each am x 2 days,  1 each am x 2  days and stop  For cough use flutter and Take delsym two tsp every 12 hours and supplement if needed with  tramadol 50 mg up to 2 every 4 hours  Return with all meds/ flutter and med calendar if not better    05/17/2016 acute extended ov/Wert re: acutely ill p daughter got sick/ out of aciphex one week prior to onset Chief Complaint  Patient presents with  . Acute Visit    Pt c/o non prod cough and SOB x 2 wks. She states she gets winded taking a long walk.   has not tried saba yet  No obvious day to day or daytime variabilty or assoc excess/ purulent sputum or  cp or chest tightness, subjective wheeze overt sinus or hb symptoms. No unusual exp hx or h/o childhood pna/ asthma or knowledge of premature birth.  Sleeping ok without nocturnal  or early am exacerbation  of respiratory  c/o's or need for noct saba. Also denies any obvious fluctuation of symptoms with weather or environmental changes or other aggravating or alleviating factors except as outlined above   Current Medications, Allergies, Complete Past Medical History, Past Surgical History, Family History, and Social History were reviewed in [employer] record.  ROS  The following are not active complaints unless bolded sore throat, dysphagia, dental problems, itching, sneezing,  nasal congestion or excess/ purulent secretions, ear ache,   fever, chills, sweats, unintended wt loss, pleuritic or exertional cp, hemoptysis,  orthopnea pnd or leg swelling, presyncope, palpitations, heartburn, abdominal pain, anorexia, nausea, vomiting, diarrhea  or change in bowel or urinary habits, change in stools or urine, dysuria,hematuria,  rash, arthralgias, visual complaints, headache, numbness weakness or ataxia or problems with walking or coordination,  change in mood/affect or memory.             Objective:   Physical Exam   amb obese  bf nad rattling/congested sounding cough    05/17/2016         215   05/02/15 219 lb  (99.338 kg)  04/07/15 219 lb 9.6 oz (99.61 kg)  03/24/15 216 lb (97.977 kg)    Vital signs reviewed    HEENT: nl dentition, turbinates, and orophanx. Nl external ear canals without cough reflex   NECK :  without JVD/Nodes/TM/ nl carotid upstrokes bilaterally   LUNGS: no acc muscle use, clear to A and P bilaterally without cough on insp or exp maneuvers    CV:  RRR  no s3 or murmur or increase in P2, no edema   ABD:  soft and nontender with nl excursion in the supine position. No bruits or organomegaly, bowel sounds nl  MS:  warm without deformities, calf tenderness, cyanosis  or clubbing  SKIN: warm and dry without lesions  - minimal sarcoid changes at junction of nose and   upper lip  NEURO:  alert, approp, no deficits       CXR PA and Lateral:   05/17/2016 :    I personally reviewed images and agree with radiology impression as follows:    Increased interstitial opacities in the mid and lower lungs bilaterally consistent with a flare of known sarcoidosis. Mediastinal and hilar lymphadenopathy appears be more prominent today as well. my impression:  Not as deep an inspiration       Assessment & Plan:

## 2016-05-19 NOTE — Assessment & Plan Note (Signed)
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  - Chronic pred rx 2/09> 04/2008  - Restart Pred December 26, 2008 > June 12, 2009 stopped  > restarted 07/11/11>>pt stopped 10/15/11  - PFT's March 23, 2010 VC 1.77 (66%) no obst, ERV 34%, DLCO 53 > 115% corrected - PFT's 09/30/12       VC 2.02 (57%) ratio 78 , erv 13%  DLCO 55 > 118% corrected - Skin bx 05/19/14 bridge of nose > Pos NCG - 05/02/2015  Walked RA x 3 laps @ 185 ft each stopped due to end of study, no sob/ no desat at nl pace/ no cough provoked      No def evidence of a flare at this point though cxr is worrisome for recurrent dz, will bring back for repeat cxr and pfts   I had an extended discussion with the patient reviewing all relevant studies completed to date and  lasting 15 to 20 minutes of a 25 minute visit    Each maintenance medication was reviewed in detail including most importantly the difference between maintenance and prns and under what circumstances the prns are to be triggered using an action plan format that is not reflected in the computer generated alphabetically organized AVS.    Please see instructions for details which were reviewed in writing and the patient given a copy highlighting the part that I personally wrote and discussed at today's ov.

## 2016-05-20 ENCOUNTER — Telehealth: Payer: Self-pay | Admitting: Internal Medicine

## 2016-05-20 DIAGNOSIS — D869 Sarcoidosis, unspecified: Secondary | ICD-10-CM

## 2016-05-20 NOTE — Telephone Encounter (Signed)
Call pt: Reviewed cxr and does suggest mild sarcoid activity - not enough to warrant any change in rx unless symptoms fail to respond to recs - needs ov with cxr/ pfts 2-3 months same day  Spoke with pt and notified of results per Dr. Melvyn Novas. Pt verbalized understanding and denied any questions. Appt with PFT scheduled

## 2016-05-20 NOTE — Progress Notes (Signed)
Quick Note:  lmtcb ______ 

## 2016-05-29 NOTE — Progress Notes (Signed)
Quick Note:  lmtcb ______ 

## 2016-05-31 NOTE — Progress Notes (Signed)
Quick Note:  Spoke with pt and notified of results per Dr. Wert. Pt verbalized understanding and denied any questions.  ______ 

## 2016-08-12 ENCOUNTER — Ambulatory Visit
Admission: RE | Admit: 2016-08-12 | Discharge: 2016-08-12 | Disposition: A | Discharge status: Home / Self Care | Payer: Managed Care, Other (non HMO) | Source: Ambulatory Visit | Attending: Radiation Oncology | Admitting: Radiation Oncology

## 2016-08-12 ENCOUNTER — Encounter: Payer: Self-pay | Admitting: Radiation Oncology

## 2016-08-12 DIAGNOSIS — Z9221 Personal history of antineoplastic chemotherapy: Secondary | ICD-10-CM | POA: Insufficient documentation

## 2016-08-12 DIAGNOSIS — Z5189 Encounter for other specified aftercare: Secondary | ICD-10-CM | POA: Diagnosis not present

## 2016-08-12 DIAGNOSIS — Z9889 Other specified postprocedural states: Secondary | ICD-10-CM | POA: Diagnosis not present

## 2016-08-12 DIAGNOSIS — Z85048 Personal history of other malignant neoplasm of rectum, rectosigmoid junction, and anus: Secondary | ICD-10-CM | POA: Diagnosis not present

## 2016-08-12 DIAGNOSIS — Z79899 Other long term (current) drug therapy: Secondary | ICD-10-CM | POA: Insufficient documentation

## 2016-08-12 DIAGNOSIS — A630 Anogenital (venereal) warts: Secondary | ICD-10-CM | POA: Insufficient documentation

## 2016-08-12 DIAGNOSIS — Z923 Personal history of irradiation: Secondary | ICD-10-CM | POA: Diagnosis not present

## 2016-08-12 HISTORY — DX: Personal history of antineoplastic chemotherapy: Z92.21

## 2016-08-12 HISTORY — DX: Personal history of irradiation: Z92.3

## 2016-08-12 NOTE — Progress Notes (Signed)
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 female who was treated for T1N0 squamous cancer of the anus in May and June of 2005. She had a good response. She returned for treatment of a keloid of the left chest wall along her portacath scar in June 2006. Her story following that includes the development of raised lesions in the gluteal crease in September 2016. She was treated with Baltrex with no response. She has had biopsies showing squamous mucosa with mild dysplasia. This was reported by Dr. Greer Pickerel. At that time he performed a punch biopsy then demonstrating angio keratoma with granulomatous reaction.  There was no sign of malignancy. The patient was refered to Dr. Wyatt Haste of dermatology. She has been treated with topical therapy including steroids with some improvement. She has been referred today   PREVIOUS RADIATION THERAPY: Yes rectal ca in 2005  Past Medical History:  Diagnosis Date  . Bifid uvula   . Cleft lip    repaired   . GERD (gastroesophageal reflux disease)   . History of chemotherapy   . History of radiation therapy   . Hypertension   . Obesity   . Pulmonary nodule, left   . Rectal cancer ([place])   . Sarcoidosis ([place])   :   Past Surgical History:  Procedure Laterality Date  . excision  of anal cancer    Fa Current Outpatient Prescriptions:  .  albuterol (PROVENTIL HFA;VENTOLIN HFA) 108 (90 BASE) MCG/ACT inhaler, Inhale 2 puffs into the lungs every 4 (four) hours as needed for wheezing or shortness of breath., Disp: , Rfl:  .  bisoprolol (ZEBETA) 10 MG tablet, Take 1 tablet (10 mg total)  by mouth daily., Disp: 30 tablet, Rfl: 11 .  famotidine (PEPCID) 20 MG tablet, Take 20 mg by mouth at bedtime., Disp: , Rfl:  .  hydrochlorothiazide (HYDRODIURIL) 25 MG tablet, Take 1 tablet (25 mg total) by mouth daily., Disp: 30 tablet, Rfl: 3 .  mometasone (ELOCON) 0.1 % cream, Apply 1 application topically daily., Disp: , Rfl:  .  RABEprazole (ACIPHEX) 20 MG tablet, take 1 tablet by mouth once daily 30 MIN BEFORE EATING, Disp: 30 tablet, Rfl: 2ry N Allergies  Allergen Reactions  . Penicillins Hives  S:   Family History  Problem Relation Age of Onset  . Allergies Mother   . Asthma Mother   . Allergies Sister   . Allergies Brother   . Asthma Brother   . Asthma Sister   . Sarcoidosis Neg Hx   A 15 point review of systems is documented in the electronic medical record. This was obtained by the nursing staff. However, I reviewed this with the patient to discuss relevant findings and make appropriate changes.  Pertinent items are noted in HPI. Patient complains of bleeding at bottom lesions when there is friction from her bottom rubbing together. She follows with Dr. Justin Mend for sarcoidosis which flairs up occasionally that is relieved with an inhaler. She is scheduled for a breathing test in September.    PHYSICAL EXAM:  Blood pressure (!) 191/97, pulse 74, resp. rate 16, weight 220 lb 3.2 oz (99.9 kg),  SpO2 100 %.   Focused examination of the gluteal crease reveals numerous polypoid growths of varying size and color up to 1.2 cm with no bleeding or drainage. A photograph was taken.     KPS = 80  100 - Normal; no complaints; no evidence of disease. 90   - Able to carry on normal activity; minor signs or symptoms of disease. 80   - Normal activity with effort; some signs or symptoms of disease. 45   - Cares for self; unable to carry on normal activity or to do active work. 60   - Requires occasional assistance, but is able to care for most of his personal needs. 50   - Requires  considerable assistance and frequent medical care. 25   - Disabled; requires special care and assistance. 60   - Severely disabled; hospital admission is indicated although death not imminent. 23   - Very sick; hospital admission necessary; active supportive treatment necessary. 10   - Moribund; fatal processes progressing rapidly. 0     - Dead  Karnofsky DA, Abelmann Rapides, Craver LS and Burchenal [facility] [phone]) The use of the nitrogen mustards in the palliative treatment of carcinoma: with particular reference to bronchogenic carcinoma Cancer 1 634-56  LABORATORY DATA:  Lab Results  Component Value Date   WBC 7.2 06/27/2015   HGB 12.7 06/27/2015   HCT 36.3 06/27/2015   MCV 91.8 06/27/2015   PLT 222.0 06/27/2015   Lab Results  Component Value Date   NA 137 06/27/2015   K 4.1 06/27/2015   CL 101 06/27/2015   CO2 29 06/27/2015   Lab Results  Component Value Date   ALT 23 06/27/2015   AST 24 06/27/2015   ALKPHOS 128 (H) 06/27/2015   BILITOT 0.8 06/27/2015     RADIOGRAPHY: No results found.    IMPRESSION: 60 year old woman with a history of stage 1 anal cancer arising from condylomata, as well as keloid formation, and sarcoidosis. All of these processes could potentially be playing roles in the development of the cutaneous lesions in the gluteal crease. Regardless, the process appears to represent benign changes rather than malignancy.   PLAN: Today, I talked to the patient about the issues and potential causes of her current condition. In terms of condylomata and keloids, one potential definitve treatment could incorporate radical surgical removal, perhaps followed by low dose radiation. However, given the anatomically complex geography, this may represent a major intervention including APR. IN talking to the patient about this type of radical surgical approach, she very reasonably would prefer a more conservative option. Today, we provided the patient with a SITZ bath to optimize hygiene.  We will discuss her case with our dermatopathology and surgical oncology colleagues and see her back in 4 weeks. I spent time face to face with the patient and more than 50% of that time was spent in counseling and/or coordination of care.   ------------------------------------------------   Tyler Pita, MD [facility] Director and Director of Stereotactic Radiosurgery Direct Dial: [phone]  Fax: [phone] [email or web address]  Skype  [email or web address]  This document serves as a record of services personally performed by Tyler Pita, MD. It was created on his behalf by Bethann Humble, a trained medical scribe. The creation of this record is based on the scribe's personal observations and the provider's statements to them. This document has been checked and approved by the attending provider.

## 2016-08-12 NOTE — Progress Notes (Signed)
Picture 2 of 2.

## 2016-08-12 NOTE — Progress Notes (Signed)
Picture 1 of 2.

## 2016-08-12 NOTE — Progress Notes (Addendum)
Originally patient was seen by Dr. Burnice Logan for possible perianal warts that presented in June of 2016. Dr. Burnice Logan referred her to Dr. Greer Pickerel. Dr. Redmond Pulling referred her to Dr. Allyson Sabal. Lastly, Dr. Allyson Sabal referred her to Dr. Tammi Klippel.  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 Though benign they are irritating because they itch and bleed. Dr. Allyson Sabal prescribed Mometasone. Patient reports when using topical steroid the angiokeratomas scab but, quickly open back up and bleed when she ceases use.Referred for possible XRT. Patient has a hx of sarcoidosis.   Reports SOB with exertion. Denies headaches, dizziness, nausea, vomiting, hot flashes, diarrhea, constipation, or blood in stool. BP elevated.    Hx of radiation therapy: Yes; rectal ca in 2005 Pacemaker: No Pregnant: No Ax:Penicillin

## 2016-08-13 NOTE — Addendum Note (Signed)
Encounter addended by: Heywood Footman, RN on: 08/13/2016 11:29 AM<BR>    Actions taken: Charge Capture section accepted

## 2016-08-22 ENCOUNTER — Ambulatory Visit (INDEPENDENT_AMBULATORY_CARE_PROVIDER_SITE_OTHER): Payer: Managed Care, Other (non HMO) | Admitting: Internal Medicine

## 2016-08-22 ENCOUNTER — Encounter (INDEPENDENT_AMBULATORY_CARE_PROVIDER_SITE_OTHER): Payer: Managed Care, Other (non HMO) | Admitting: Internal Medicine

## 2016-08-22 ENCOUNTER — Ambulatory Visit (INDEPENDENT_AMBULATORY_CARE_PROVIDER_SITE_OTHER)
Admission: RE | Admit: 2016-08-22 | Discharge: 2016-08-22 | Disposition: A | Discharge status: Home / Self Care | Payer: Managed Care, Other (non HMO) | Source: Ambulatory Visit | Attending: Internal Medicine | Admitting: Internal Medicine

## 2016-08-22 ENCOUNTER — Encounter: Payer: Self-pay | Admitting: Internal Medicine

## 2016-08-22 VITALS — BP 162/100 | HR 77 | Ht 64.0 in | Wt 216.0 lb

## 2016-08-22 DIAGNOSIS — R05 Cough: Secondary | ICD-10-CM

## 2016-08-22 DIAGNOSIS — I10 Essential (primary) hypertension: Secondary | ICD-10-CM

## 2016-08-22 DIAGNOSIS — D869 Sarcoidosis, unspecified: Secondary | ICD-10-CM | POA: Diagnosis not present

## 2016-08-22 DIAGNOSIS — R058 Other specified cough: Secondary | ICD-10-CM

## 2016-08-22 LAB — PULMONARY FUNCTION TEST
DL/VA % pred: 88 %
DL/VA: 4.31 ml/min/mmHg/L
DLCO COR: 13.91 ml/min/mmHg
DLCO cor % pred: 55 %
DLCO unc % pred: 59 %
DLCO unc: 14.95 ml/min/mmHg
FEF 25-75 Post: 1.7 L/sec
FEF 25-75 Pre: 1.29 L/sec
FEF2575-%Change-Post: 32 %
FEF2575-%Pred-Post: 77 %
FEF2575-%Pred-Pre: 58 %
FEV1-%Change-Post: 5 %
FEV1-%PRED-PRE: 62 %
FEV1-%Pred-Post: 66 %
FEV1-POST: 1.45 L
FEV1-PRE: 1.37 L
FEV1FVC-%CHANGE-POST: 2 %
FEV1FVC-%Pred-Pre: 100 %
FEV6-%CHANGE-POST: 3 %
FEV6-%PRED-POST: 65 %
FEV6-%PRED-PRE: 63 %
FEV6-PRE: 1.7 L
FEV6-Post: 1.77 L
FEV6FVC-%PRED-PRE: 103 %
FEV6FVC-%Pred-Post: 103 %
FVC-%Change-Post: 3 %
FVC-%PRED-POST: 63 %
FVC-%Pred-Pre: 61 %
FVC-Post: 1.77 L
FVC-Pre: 1.7 L
POST FEV6/FVC RATIO: 100 %
Post FEV1/FVC ratio: 82 %
Pre FEV1/FVC ratio: 80 %
Pre FEV6/FVC Ratio: 100 %
RV % pred: 74 %
RV: 1.47 L
TLC % PRED: 63 %
TLC: 3.28 L

## 2016-08-22 LAB — NITRIC OXIDE: Nitric Oxide: 10

## 2016-08-22 NOTE — Progress Notes (Signed)
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  Last chronic prednisone 04/15/08 with no flare cough no nodules (right leg and left hand and right arm)   History of Present Illness  Last ov 02/17/13 no evidence active sarcoid rec Aciphex 20 mg Take 30-60 min before first meal of the day also when coughing for any reason take Pepcid 20 mg one at bedtime  Take delsym two tsp every 12 hours and supplement if needed with  tramadol 50 mg up to 2 every 4 hours to suppress the urge to cough. Swallowing water or using ice chips/non mint and menthol containing candies (such as lifesavers or sugarless jolly ranchers) are also effective.  You should rest your voice and avoid activities that you know make you cough. Once you have eliminated the cough for 3 straight days try reducing the tramadol first,  then the delsym as tolerated.   GERD diet    01/28/2014  Pulmonary consultation / Wert/ on no maint resp rx / rare baseline need for saba  Chief Complaint  Patient presents with  . Pulmonary Consult    Referred per Dr. Posey Pronto. [name] Cough is prod with moderate yellow sputum. She is using proair approx tid.  Onset was Jan 01 2014 while on aciphex with bfast with flu like illness but not follow contingency rx  On prednisone taper/ sp tamiflu/cough meds  Not limited by breathing from desired activities   rec Aciphex 20 mg Take 30-60 min before first meal of the day also when coughing for any reason take  Pepcid 20 mg one at bedtime and chlortrimeton 4 mg at bedtime Take delsym two tsp every 12 hours and supplement if needed with  tramadol 50 mg up to 2 every 4 hours to suppress the urge to cough.   Once you have eliminated the cough for 3 straight days try reducing the tramadol first,  then the delsym as tolerated.   GERD diet  F/u prn    03/24/2015 acute  ov/Wert re: around first April 2016   ? Uri/ no better p zpak and pred on no maint rx and not on gerd rx consisttently  Chief Complaint  Patient presents with  . Acute Visit    Pt c/o increased cough and SOB for the past 10 days. Cough is non prod.   says had been all better until 10 day prior to OV  And at baseline Not limited by breathing from desired activities  / cough is dry and day > noct, peaking in late afternoon and early evening  >>changed from toprol to bisoprolol   04/07/2015 Follow up : Sarcoid /cough  Patient returns for a two-week follow-up and medication review We reviewed all her medications organize them into a medication count with patient education Appears to be taking her medications correctly Last visit was seen for cough and shortness of breath despite being given antibiotic and prednisone taper. Patient was changed from Toprol to bisoprolol. She does feel that her cough is improved,  however, she does have some lingering dyspnea. rec Continue on current regimen.  Follow med calendar closely and bring to each visit.    05/02/2015 f/u ov/Wert re: chronic recurrent cough since late March 2016  Chief Complaint  Patient presents with  . Acute Visit    Pt c/o cough and SOB not improving since last visit.  Her cough is non prod. She is using albuterol inhaler 2-3 x per day.   sob at rest only happens when coughing/ seems worse talking Doe x across the warehouse at a nl pace, never sitting, never supine  rec When you can take 3 days off: Prednisone 10 mg take  4 each am x 2 days,   2 each am x 2 days,  1 each am x 2  days and stop  For cough use flutter and Take delsym two tsp every 12 hours and supplement if needed with  tramadol 50 mg up to 2 every 4 hours  Return with all meds/ flutter and med calendar if not better    05/17/2016 acute extended ov/Wert re: acutely ill p daughter got sick/ out of aciphex one week prior to onset Chief Complaint  Patient presents with  . Acute Visit    Pt c/o non prod cough and SOB x 2 wks. She states she gets winded taking a long walk.   has not tried saba yet rec Pantoprazole (protonix) 40 mg   Take  30-60 min before first meal of the day and Pepcid (famotidine)  20 mg one @  bedtime until return to office - this is the best way to tell whether stomach acid is contributing to your problem.   If not improving > zpak/ Prednisone 10 mg take  4 each am x 2 days,   2 each am x 2 days,  1 each am x 2 days and stop    08/22/2016  f/u ov/Wert re: cough mostly daytime non prod x May 2017 / not taking gerd rx as rec  Chief Complaint  Patient presents with  . Follow-up    Cough is unchanged. No new co's today.  Not limited by breathing from desired activities walks a mile 3 x weekly s stopping   No obvious day to day or daytime variabilty or assoc excess/ purulent sputum or  cp or chest tightness, subjective wheeze overt sinus or hb symptoms. No unusual exp hx or h/o childhood pna/ asthma or knowledge of premature birth.  Sleeping ok without nocturnal  or early am exacerbation  of respiratory  c/o's or need for noct saba. Also denies any obvious fluctuation of symptoms with weather or environmental changes or other aggravating or alleviating factors except as outlined above   Current Medications, Allergies, Complete Past Medical History, Past Surgical History, Family History, and Social History were reviewed in [employer] record.  ROS  The following are not active complaints unless bolded sore throat, dysphagia, dental problems, itching, sneezing,  nasal  congestion or excess/ purulent secretions, ear ache,   fever, chills, sweats, unintended wt loss, pleuritic or exertional cp, hemoptysis,  orthopnea pnd or leg swelling, presyncope, palpitations, heartburn, abdominal pain, anorexia, nausea, vomiting, diarrhea  or change in bowel or urinary habits, change in stools or urine, dysuria,hematuria,  rash, arthralgias, visual complaints, headache, numbness weakness or ataxia or problems with walking or coordination,  change in mood/affect or memory.             Objective:   Physical Exam  amb obese  bf nad  - freq throat clearing   08/22/2016         216   05/17/2016        215   05/02/15 219 lb (99.338 kg)  04/07/15 219 lb 9.6 oz (99.61 kg)  03/24/15 216 lb (97.977 kg)    Vital signs reviewed /  HBP noted   HEENT: nl dentition, turbinates, and orophanx. Nl external ear canals without cough reflex   NECK :  without JVD/Nodes/TM/ nl carotid upstrokes bilaterally   LUNGS: no acc muscle use, clear to A and P bilaterally without cough on insp or exp maneuvers    CV:  RRR  no s3 or murmur or increase in P2, no edema   ABD:  soft and nontender with nl excursion in the supine position. No bruits or organomegaly, bowel sounds nl  MS:  warm without deformities, calf tenderness, cyanosis or clubbing  SKIN: warm and dry without lesions  - minimal sarcoid changes at junction of nose and   upper lip  NEURO:  alert, approp, no deficits       CXR PA and Lateral:   08/22/2016 :    I personally reviewed images and agree with radiology impression as follows:    No change in the appearance the chest since the most recent examination with findings consistent with pulmonary parenchymal changes of sarcoidosis and lymphadenopathy in the chest.       Assessment & Plan:

## 2016-08-22 NOTE — Patient Instructions (Addendum)
Try not to clear your throat  - keep sugarless candy handy but no mint menthol or chocolate   Please remember to go to the   x-ray department downstairs for your tests - we will call you with the results when they are available.  Please schedule a follow up visit in 3 months but call sooner if needed

## 2016-08-23 NOTE — Progress Notes (Signed)
LMTCB

## 2016-08-25 NOTE — Assessment & Plan Note (Signed)
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  - Chronic pred rx 2/09> 04/2008  - Restart Pred December 26, 2008 > June 12, 2009 stopped  > restarted 07/11/11>>pt stopped 10/15/11  - PFT's March 23, 2010 VC 1.77 (66%) no obst, ERV 34%, DLCO 53 > 115% corrected - PFT's 09/30/12       VC 2.02 (57%) ratio 78 , erv 13%  DLCO 55 > 118% corrected - Skin bx 05/19/14 bridge of nose > Pos NCG - 05/02/2015  Walked RA x 3 laps @ 185 ft each stopped due to end of study, no sob/ no desat at nl pace/ no cough provoked  - PFT's  08/22/2016 VC  1.81 ratio 82  with DLCO  59/55 % corrects to 88 % for alv volume     Really no evidence of physiologically significant dz progression off prednisone since 2009

## 2016-08-25 NOTE — Assessment & Plan Note (Signed)
Not ideal but may have been aggravated by pft attempts > Follow up per Primary Care planned

## 2016-08-25 NOTE — Assessment & Plan Note (Signed)
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/02/2015 >>> - flare May 2017 p stopped aciphex > rec restart 05/17/2016   She is consistently inconsistent with rx for uacs which includes gerd rx and simple measures to avoid clearing her throat and if bothers her enough might be a good candidate for a trial of low dose neurontin but for now rec conservative rx as prev rec  I had an extended discussion with the patient reviewing all relevant studies completed to date and  lasting 15 to 20 minutes of a 25 minute visit    Each maintenance medication was reviewed in detail including most importantly the difference between maintenance and prns and under what circumstances the prns are to be triggered using an action plan format that is not reflected in the computer generated alphabetically organized AVS.    Please see instructions for details which were reviewed in writing and the patient given a copy highlighting the part that I personally wrote and discussed at today's ov.

## 2016-08-26 NOTE — Progress Notes (Signed)
lmtcb x2 

## 2016-08-29 NOTE — Progress Notes (Signed)
LMTCB

## 2016-08-30 ENCOUNTER — Encounter: Payer: Self-pay | Admitting: *Deleted

## 2016-08-30 NOTE — Progress Notes (Signed)
Letter mailed to have her call for results

## 2016-09-04 ENCOUNTER — Telehealth: Payer: Self-pay | Admitting: Internal Medicine

## 2016-09-04 NOTE — Telephone Encounter (Signed)
Notes Recorded by Tanda Rockers, MD on 08/22/2016 at 4:04 PM EDT Call pt: Reviewed cxr and no acute change so no change in recommendations made at ov  lmtcb x1

## 2016-09-05 NOTE — Telephone Encounter (Signed)
Patient called returning our call - She is going to work today and would like a call back before 10:30am -pr

## 2016-09-05 NOTE — Telephone Encounter (Signed)
Called and spoke with pt and she is aware of cxr results per MW.

## 2016-09-12 ENCOUNTER — Ambulatory Visit: Payer: Self-pay

## 2016-09-12 ENCOUNTER — Ambulatory Visit
Admission: RE | Admit: 2016-09-12 | Discharge: 2016-09-12 | Disposition: A | Discharge status: Home / Self Care | Payer: Self-pay | Source: Ambulatory Visit | Attending: Radiation Oncology | Admitting: Radiation Oncology

## 2016-09-12 ENCOUNTER — Encounter: Payer: Self-pay | Admitting: Radiation Oncology

## 2016-09-12 ENCOUNTER — Other Ambulatory Visit (HOSPITAL_COMMUNITY)
Admission: RE | Admit: 2016-09-12 | Discharge: 2016-09-12 | Disposition: A | Discharge status: Home / Self Care | Payer: Managed Care, Other (non HMO) | Source: Ambulatory Visit | Attending: Radiation Oncology | Admitting: Radiation Oncology

## 2016-09-12 VITALS — BP 193/92 | HR 92 | Temp 97.8°F | Ht 64.0 in | Wt 210.5 lb

## 2016-09-12 DIAGNOSIS — Z85048 Personal history of other malignant neoplasm of rectum, rectosigmoid junction, and anus: Secondary | ICD-10-CM | POA: Insufficient documentation

## 2016-09-12 DIAGNOSIS — Z923 Personal history of irradiation: Secondary | ICD-10-CM | POA: Insufficient documentation

## 2016-09-12 DIAGNOSIS — D869 Sarcoidosis, unspecified: Secondary | ICD-10-CM | POA: Insufficient documentation

## 2016-09-12 DIAGNOSIS — Z87891 Personal history of nicotine dependence: Secondary | ICD-10-CM | POA: Insufficient documentation

## 2016-09-12 DIAGNOSIS — Z01411 Encounter for gynecological examination (general) (routine) with abnormal findings: Secondary | ICD-10-CM | POA: Diagnosis present

## 2016-09-12 DIAGNOSIS — Z1151 Encounter for screening for human papillomavirus (HPV): Secondary | ICD-10-CM | POA: Diagnosis not present

## 2016-09-12 DIAGNOSIS — Z91013 Allergy to seafood: Secondary | ICD-10-CM | POA: Insufficient documentation

## 2016-09-12 DIAGNOSIS — Z880 Allergy status to penicillin: Secondary | ICD-10-CM | POA: Insufficient documentation

## 2016-09-12 DIAGNOSIS — A6000 Herpesviral infection of urogenital system, unspecified: Secondary | ICD-10-CM

## 2016-09-12 DIAGNOSIS — K219 Gastro-esophageal reflux disease without esophagitis: Secondary | ICD-10-CM | POA: Insufficient documentation

## 2016-09-12 DIAGNOSIS — A630 Anogenital (venereal) warts: Secondary | ICD-10-CM | POA: Insufficient documentation

## 2016-09-12 DIAGNOSIS — I10 Essential (primary) hypertension: Secondary | ICD-10-CM | POA: Insufficient documentation

## 2016-09-12 DIAGNOSIS — E669 Obesity, unspecified: Secondary | ICD-10-CM | POA: Insufficient documentation

## 2016-09-12 DIAGNOSIS — Z8489 Family history of other specified conditions: Secondary | ICD-10-CM | POA: Insufficient documentation

## 2016-09-12 DIAGNOSIS — Z836 Family history of other diseases of the respiratory system: Secondary | ICD-10-CM | POA: Insufficient documentation

## 2016-09-12 DIAGNOSIS — Z9221 Personal history of antineoplastic chemotherapy: Secondary | ICD-10-CM | POA: Insufficient documentation

## 2016-09-12 NOTE — Progress Notes (Addendum)
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   HISTORY OF PRESENT ILLNESS: [name] is a 60 y.o. female who was treated with surgical excision and radiotherapy for T1N0 squamous cancer of the anus in May and June of 2005. She had a good response and has been NED since. She returned for treatment of a keloid of the left chest wall along her portacath scar in June 2006. Her story following that includes the development of raised lesions in the gluteal crease in September 2016. She was treated with Baltrex with no response. She has had biopsies showing squamous mucosa with mild dysplasia by Dr. Greer Pickerel. At that time he performed a punch biopsy then demonstrating angio keratoma with granulomatous reaction.  There was no sign of malignancy. The patient was refered to Dr. Wyatt Haste of dermatology. She has been treated with topical therapy including steroids and was seen by Dr. Tammi Klippel a few weeks ago. She comes today for further evaluation.  On review of systems, the patient reports she continues to have significant irritation of her gluteal crease and itching of her perineum as well. She has been using topical steroids which have not improved her symptoms. She reports her condyloma will bleed after wiping after having a bowel movement. She denies any irregular vaginal or anal bleeding. She denies any difficulty with bowel function, or bladder activity. She denies any abdominal pain, nausea, or vomiting. She denies any fevers, chills, chest pain, or shortness of breath. A complete review of systems is obtained and is otherwise negative.  PREVIOUS RADIATION THERAPY: Yes   Radiotherapy was given to a keloid on the left chest wall in June 2006  Treated for  anal cancer between May and June 2005  Past Medical History:  Past Medical History:  Diagnosis Date  . Bifid uvula   . Cleft lip    repaired   . GERD (gastroesophageal reflux disease)   . History of chemotherapy   . History of radiation therapy   . Hypertension   . Obesity   . Pulmonary nodule, left   . Rectal cancer ([place])   . Sarcoidosis ([place])    Past GYN History: The patient reports she is a former patient of Dr. Harrington Challenger. I did not ask her obstetrical history. She has a history of an abnormal pap smear years ago that reverted to normal, but reports that for the last 10 years, her paps have been normal. She denies any procedures or biopsies of the cervix. She also recalls having some type of ablative therapy to the vulva many years ago with Dr. Harrington Challenger. She reports she has pap smears in October each year, and is due.  Past Surgical History: Past Surgical History:  Procedure Laterality Date  . excision  of anal cancer      Social History:  Social History   Social History  . Marital status: Single    Spouse name: N/A  . Number of children: N/A  . Years of education: N/A   Occupational History  . 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 Smoking status: Former Smoker    Packs/day: 0.50    Years: 1.00    Types: Cigarettes    Quit date: 12/16/1993  . Smokeless tobacco: Never Used  Comment: smoked breifly while in college  . Alcohol use Yes  . Drug use: No  . Sexual activity: No     Comment: reports she hasn't been sexually active for 2 years.   Other Topics Concern  . Not on file   Social History Narrative  . No narrative on file    Family History: Family History  Problem Relation Age of Onset  . Allergies Mother   . Asthma Mother   . Allergies Sister   . Allergies Brother   . Asthma Brother   . Asthma Sister   . Sarcoidosis Neg Hx           Medications: Current Meds  Medication Sig  . albuterol (PROVENTIL HFA;VENTOLIN HFA) 108 (90 BASE) MCG/ACT  inhaler Inhale 2 puffs into the lungs every 4 (four) hours as needed for wheezing or shortness of breath.  . bisoprolol (ZEBETA) 10 MG tablet Take 1 tablet (10 mg total) by mouth daily.  . famotidine (PEPCID) 20 MG tablet Take 20 mg by mouth at bedtime.  . hydrochlorothiazide (HYDRODIURIL) 25 MG tablet Take 1 tablet (25 mg total) by mouth daily.  . mometasone (ELOCON) 0.1 % cream Apply 1 application topically daily.  . RABEprazole (ACIPHEX) 20 MG tablet take 1 tablet by mouth once daily 30 MIN BEFORE EATING           Allergies:  Allergies  Allergen Reactions  . Penicillins Hives  . Shellfish Allergy Swelling     PHYSICAL EXAM:  .BP (!) 193/92 (BP Location: Left Arm, Patient Position: Sitting, Cuff Size: Large)   Pulse 92   Temp 97.8 F (36.6 C) (Oral)   Ht 5\' 4"  (1.626 m)   Wt 210 lb 8 oz (95.5 kg)   BMI 36.13 kg/m  In general this is a well appearing African American female in no acute distress. She's alert and oriented x4 and appropriate throughout the examination. Cardiopulmonary assessment is negative for acute distress and she exhibits normal effort. Pelvic examination reveals normal appearing external female genitalia with several focal findings described below. However, BUS is normal in appearance. The labia do not reveal any visible lesions. Along the posterior fourchette and extending down, circumferentially around the anal verge there is hyperemic change consistent with previous radiotherapy effect, as well as raised pearly white plaquing concerning for VIN/AIN. No ulceration is noted however. Upon evaluation of the gluteal cleft there are several clusters of condylomatous changes, several of which are pedunculated with a small amount of blood oozing from the stalks. Speculum exam reveals a normal appearing cervix without gross abnormalities. A pap smear is obtained without difficulty. Bimanual exam does not reveal any palpable adnexal masses, uterus is grossly normal. Rectovaginal  exam reveals stenosis consistent with prior radiotherapy, but does not reveal palpable thickening or nodularity of the septum.       Photo from examination of the gluteal cleft on 08/12/16:   KPS = 80  100 - Normal; no complaints; no evidence of disease. 90   - Able to carry on normal activity; minor signs or symptoms of disease. 80   - Normal activity with effort; some signs or symptoms of disease. 42   - Cares for self; unable to carry on normal activity or to do active work. 60   - Requires occasional assistance, but is able to care for most of his personal needs. 50   - Requires considerable assistance and frequent medical care. 47   - Disabled; requires special care and assistance.  42   - Severely disabled; hospital admission is indicated although death not imminent. 61   - Very sick; hospital admission necessary; active supportive treatment necessary. 10   - Moribund; fatal processes progressing rapidly. 0     - Dead  Karnofsky DA, Abelmann WH, Craver LS and Burchenal [facility] [phone]) The use of the nitrogen mustards in the palliative treatment of carcinoma: with particular reference to bronchogenic carcinoma Cancer 1 634-56  LABORATORY DATA: Pap with HR HPV testing is pending.    RADIOGRAPHY: No results found.    IMPRESSION: 1.  Perianal lesions. The patient has been counseled on the exam findings, and it appears that she has multifocal condylomatous change as well as concerns for preinvasive HPV related disease. We spent significant time explaining the HPV virus and how the findings could be due to her compromised immune system from steroid uses, and from her Sarcoidosis. Unfortunately I don't have the supplies today to proceed with biopsies to formally work up these concerns. I have discussed her case with  GYN Onc, and will place a referral for evaluation . I'm concerned she may need laser vaporization plus or minus excisional treatment of these lesions, and appreciate GYN Onc's  evaluation of her. The patient is in agreement of this plan. She was also advised to discontinue use of topical steroids. She was encouraged to call if she has questions or concerns, and we will see her back as needed.  2.  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 The patient appears to be without evidence of recurrence despite the concerns for dysplasia. We will see her back as needed, but would be happy to participate in her care if needed in the future. 3. Cervical cancer screening. We will follow up with the results of her pap smear and HPV testing when available.   In a visit lasting 35 minutes, greater than 50% of the time was spent discussing HPV related diseases, and the rationale for referral to GYN Onc.   Carola Rhine, PAC

## 2016-09-12 NOTE — Progress Notes (Signed)
Ms. Satchwell reports that she now has a scab in the rectal region and keeps it covered with a bandaid due to mild, intermittent bleeding in this area. Discussion today to determine next course of action regarding treatment of this area. Denies pain at this time.  Elevated BP since she reports that she did not take her hypertensive medications today.  BP (!) 180/92 (BP Location: Right Arm, Patient Position: Sitting, Cuff Size: Large)   Pulse 81   Temp 97.8 F (36.6 C) (Oral)   Ht 5\' 4"  (1.626 m)   Wt 210 lb 8 oz (95.5 kg)   BMI 36.13 kg/m     BP (!) 193/92 (BP Location: Left Arm, Patient Position: Sitting, Cuff Size: Large)   Pulse 92   Temp 97.8 F (36.6 C) (Oral)   Ht 5\' 4"  (1.626 m)   Wt 210 lb 8 oz (95.5 kg)   BMI 36.13 kg/m    Wt Readings from Last 3 Encounters:  09/12/16 210 lb 8 oz (95.5 kg)  08/22/16 216 lb (98 kg)  08/12/16 220 lb 3.2 oz (99.9 kg)

## 2016-09-17 ENCOUNTER — Telehealth: Payer: Self-pay | Admitting: *Deleted

## 2016-09-17 NOTE — Telephone Encounter (Signed)
Called patient to inform of appt. With Dr. Everlene Farrier on 10-08-16 - arrival time - 2 pm - address- [address] , ph. No. [phone], spoke with patient and she agreed to this appt.

## 2016-09-20 ENCOUNTER — Telehealth: Payer: Self-pay | Admitting: *Deleted

## 2016-09-20 NOTE — Telephone Encounter (Signed)
Called patient to inform that appt. With Dr. Gaetano Net has been cancelled and she is set up with Dr. Gillian Scarce on 09-25-16- arrival time , 10 am , unable to leave message will call later.

## 2016-09-23 ENCOUNTER — Telehealth: Payer: Self-pay | Admitting: *Deleted

## 2016-09-23 NOTE — Telephone Encounter (Signed)
Called patient to inform of her appt. With [name] on 09-25-16 - arrival time - 10 am, spoke with patient and she is aware of this appt.

## 2016-09-24 LAB — CYTOLOGY - PAP

## 2016-09-25 ENCOUNTER — Encounter: Payer: Self-pay | Admitting: Gynecologic Oncology

## 2016-09-25 ENCOUNTER — Telehealth: Payer: Self-pay | Admitting: Gynecologic Oncology

## 2016-09-25 ENCOUNTER — Ambulatory Visit: Payer: Managed Care, Other (non HMO) | Attending: Gynecologic Oncology | Admitting: Gynecologic Oncology

## 2016-09-25 ENCOUNTER — Telehealth: Payer: Self-pay | Admitting: *Deleted

## 2016-09-25 VITALS — BP 196/91 | HR 77 | Temp 97.8°F | Resp 18 | Ht 64.0 in | Wt 218.3 lb

## 2016-09-25 DIAGNOSIS — Z9221 Personal history of antineoplastic chemotherapy: Secondary | ICD-10-CM | POA: Insufficient documentation

## 2016-09-25 DIAGNOSIS — E669 Obesity, unspecified: Secondary | ICD-10-CM | POA: Insufficient documentation

## 2016-09-25 DIAGNOSIS — Z923 Personal history of irradiation: Secondary | ICD-10-CM | POA: Insufficient documentation

## 2016-09-25 DIAGNOSIS — D869 Sarcoidosis, unspecified: Secondary | ICD-10-CM | POA: Diagnosis not present

## 2016-09-25 DIAGNOSIS — Z91013 Allergy to seafood: Secondary | ICD-10-CM | POA: Diagnosis not present

## 2016-09-25 DIAGNOSIS — Z840 Family history of diseases of the skin and subcutaneous tissue: Secondary | ICD-10-CM | POA: Insufficient documentation

## 2016-09-25 DIAGNOSIS — N9089 Other specified noninflammatory disorders of vulva and perineum: Secondary | ICD-10-CM

## 2016-09-25 DIAGNOSIS — Z6837 Body mass index (BMI) 37.0-37.9, adult: Secondary | ICD-10-CM | POA: Diagnosis not present

## 2016-09-25 DIAGNOSIS — Z87891 Personal history of nicotine dependence: Secondary | ICD-10-CM | POA: Insufficient documentation

## 2016-09-25 DIAGNOSIS — Z85048 Personal history of other malignant neoplasm of rectum, rectosigmoid junction, and anus: Secondary | ICD-10-CM | POA: Insufficient documentation

## 2016-09-25 DIAGNOSIS — K219 Gastro-esophageal reflux disease without esophagitis: Secondary | ICD-10-CM | POA: Insufficient documentation

## 2016-09-25 DIAGNOSIS — I10 Essential (primary) hypertension: Secondary | ICD-10-CM | POA: Insufficient documentation

## 2016-09-25 DIAGNOSIS — N903 Dysplasia of vulva, unspecified: Secondary | ICD-10-CM

## 2016-09-25 DIAGNOSIS — Z880 Allergy status to penicillin: Secondary | ICD-10-CM | POA: Insufficient documentation

## 2016-09-25 DIAGNOSIS — Z825 Family history of asthma and other chronic lower respiratory diseases: Secondary | ICD-10-CM | POA: Insufficient documentation

## 2016-09-25 DIAGNOSIS — R911 Solitary pulmonary nodule: Secondary | ICD-10-CM | POA: Insufficient documentation

## 2016-09-25 DIAGNOSIS — K629 Disease of anus and rectum, unspecified: Secondary | ICD-10-CM

## 2016-09-25 DIAGNOSIS — L929 Granulomatous disorder of the skin and subcutaneous tissue, unspecified: Secondary | ICD-10-CM | POA: Diagnosis not present

## 2016-09-25 DIAGNOSIS — N909 Noninflammatory disorder of vulva and perineum, unspecified: Secondary | ICD-10-CM

## 2016-09-25 NOTE — Telephone Encounter (Signed)
Called [facility] for new patient referral.  Left message with [name], referrals coordinator about patient needing to get in to see Dr. Leighton Ruff as soon as possible.  Hx anal cancer with new anal lesions per Dr. Carlis Abbott.

## 2016-09-25 NOTE — Patient Instructions (Signed)
We will call you with the results of the biopsies from today.  We will also refer you to meet with Dr. Leighton Ruff at [facility].  Please call our office for any questions or concerns.   Vulva Biopsy, Care After These instructions give you information on caring for yourself after your procedure. Your doctor may also give you more specific instructions. Call your doctor if you have any problems or questions after your procedure. HOME CARE   Only take medicine as told by your doctor.  Do not rub the biopsy area after you pee (urinate). Gently pat the area dry. You can use a bottle filled with warm water (peri-bottle) to clean the area. Gently wipe from front to back.  Clean the area using water and mild soap. Gently pat the area dry.  Check the biopsy area every day using a mirror. Make sure it is healing.  Do not have sex (intercourse) for 3-4 days or as told by your doctor.  Avoid exercise (running, biking) for 2-3 days or as told by your doctor.  You may shower after the procedure. Avoid bathing and swimming until the stitches (sutures) dissolve and healing is complete.  Wear loose cotton underwear. Do not wear tight pants.  Keep all follow-up visits with your doctor.  Call your doctor to get your test results. GET HELP RIGHT AWAY IF:   You have pain that gets worse and is not helped by pain medicine.  Your vaginal area becomes puffy (swollen) or red.  You have fluid or an abnormally bad smell coming from your vagina.  You have a fever. MAKE SURE YOU:  Understand these instructions.  Will watch your condition.  Will get help right away if you are not doing well or get worse.   This information is not intended to replace advice given to you by your health care provider. Make sure you discuss any questions you have with your health care provider.   Document Released: 02/28/2009 Document Revised: 12/23/2014 Document Reviewed: 09/28/2012 Elsevier Interactive  Patient Education [employer].

## 2016-09-25 NOTE — Telephone Encounter (Signed)
Notified patient of  Future appointmnets

## 2016-09-25 NOTE — Progress Notes (Signed)
GYNECOLOGIC ONCOLOGY NEW PATIENT CONSULTATION  Date of Service: 09/25/16 Referring Provider: [facility] Fl[facility], [facility] Consulting Provider: Marcello Fennel. Carlis Abbott, MD  HISTORY OF PRESENT ILLNESS: [name] is a 60 y.o. 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  Ms. Newsum has a personal history of rectal cancer treated with surgery and radiation. She was seen in radiation oncology and noted to have perianal condyloma as well as an extensive white lesion extending out of the rectum and sent to our clinic for further evaluation and treatment.  Upon questioning, the patient reports a history of vulvar dysplasia treated with laser ablation in the past. She notes some vulvar pruritis but her main concern is a large bleeding lesion in her gluteal folds which she has to place a bandage over due to bleeding. She denies CP, SOB, nausea, vomiting.   PAST MEDICAL HISTORY: Past Medical History:  Diagnosis Date  . Bifid uvula   . Cancer [facility]) 2005   rectal cancer  . Cleft lip    repaired   . GERD (gastroesophageal reflux disease)   . History of chemotherapy   . History of radiation therapy   . Hypertension   . Obesity   . Pulmonary nodule, left   . Rectal cancer ([place])   . Sarcoidosis ([place])     PAST SURGICAL HISTORY: Past Surgical History:  Procedure Laterality Date  . excision  of anal cancer     MEDICATIONS:  Current Outpatient Prescriptions:  .  bisoprolol (ZEBETA) 10 MG tablet, Take 1 tablet (10 mg total) by mouth daily., Disp: 30 tablet, Rfl: 11 .  famotidine (PEPCID) 20 MG tablet, Take 20 mg by mouth at bedtime., Disp: , Rfl:  .  hydrochlorothiazide (HYDRODIURIL) 25 MG tablet, Take 1 tablet (25 mg total) by mouth daily., Disp: 30 tablet, Rfl: 3 .  mometasone (ELOCON) 0.1 % cream, Apply 1 application topically daily., Disp: , Rfl:  .  RABEprazole (ACIPHEX) 20 MG tablet, take 1 tablet by mouth once daily 30 MIN BEFORE EATING,  Disp: 30 tablet, Rfl: 2 .  albuterol (PROVENTIL HFA;VENTOLIN HFA) 108 (90 BASE) MCG/ACT inhaler, Inhale 2 puffs into the lungs every 4 (four) hours as needed for wheezing or shortness of breath., Disp: , Rfl:   ALLERGIES: Allergies  Allergen Reactions  . Penicillins Hives  . Shellfish Allergy Swelling    FAMILY HISTORY: Family History  Problem Relation Age of Onset  . Allergies Mother   . Asthma Mother   . Allergies Sister   . Allergies Brother   . Asthma Brother   . Asthma Sister   . Sarcoidosis Neg Hx     SOCIAL HISTORY: Social History   Social History  . Marital status: Single    Spouse name: N/A  . Number of children: N/A  . Years of education: N/A   Occupational History  . 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 Smoking status: Former Smoker    Packs/day: 0.50    Years: 1.00    Types: Cigarettes    Quit date: 12/16/1993  . Smokeless tobacco: Never Used     Comment: smoked breifly while in college  . Alcohol use Yes     Comment: ocass wine  . Drug use: No  . Sexual activity: No     Comment: reports she hasn't been sexually active for 2 years.   Other Topics Concern  . Not on file   Social History Narrative  .  No narrative on file    REVIEW OF SYSTEMS: Complete 10-system review is negative except as per HPI.  PHYSICAL EXAM: BP (!) 196/91 (BP Location: Left Arm, Patient Position: Sitting) Comment: informed Nurse  Pulse 77   Temp 97.8 F (36.6 C) (Oral)   Resp 18   Ht 5\' 4"  (1.626 m)   Wt 218 lb 4.8 oz (99 kg)   SpO2 98%   BMI 37.47 kg/m  General: Alert, oriented, no acute distress. HEENT: Normocephalic, atraumatic.  Sclera anicteric, posterior oropharynx clear.  Normal dentition. Chest: Clear to auscultation bilaterally. Cardiovascular: Regular rate and rhythm, no murmurs, rubs, or gallops. Abdomen: Obese. Normoactive bowel sounds.  Soft, nondistended, nontender to palpation.  No masses or hepatosplenomegaly appreciated.  No evidence of  hernia.  No palpable fluid wave.   Extremities: Grossly normal range of motion.  Warm, well perfused.  No edema bilaterally. Skin: Between the gluteal folds is a large hyperpigmented lesion ~4cm Lymphatics: No cervical, supraclavicular, or inguinal adenopathy. GU: External genitalia with bilateral slightly raised lesions on the labia majora at 2 and 11 o'clock, Dilute acetic acid applied and lesions had minimal acetowhite change. Evaluation of the anus shows an extensive white lesion extending into the anus. Biopsy performed at 2 oclock on the left vulva and 8 o'clock near the anus, see note below.   VULVAR BIOPSY The procedure was explained to the patient and verbal consent was obtained prior to the procedure.  The skin was cleaned with Betadine.  2 ml of 1% lidocaine was injected at the planned biopsy site on the vulva and 3 mL at the anal biopsy site.  A 3 mm punch biopsy was used to obtain each biopsy specimen.  Hemostasis was achieved with silver nitrate.  Patient tolerated the procedure well.  ASSESSMENT AND PLAN: [name] is a 60 y.o. 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  Biopsies obtained today from vulva and perianal lesion. I do not think her minimal vulvar dysplasia seen on exam today is her most pressing issue. We will refer her to Dr. Leighton Ruff with general surgery for evaluation of her perianal lesion. We will follow up on the biopsy results and call the patient when these results are available.   A copy of this note was sent to the patient's referring provider. I appreciate the opportunity to be involved in the care of this patient.  Marcello Fennel. Carlis Abbott, MD

## 2016-09-27 ENCOUNTER — Telehealth: Payer: Self-pay | Admitting: Gynecologic Oncology

## 2016-09-27 NOTE — Telephone Encounter (Signed)
Left message with patient about biopsy results and Dr. Ainsley Spinner recommendations that "she doesn't need follow up with [place]. She probably just needs dermatology but perhaps she should still see the general surgeon to be safe."  Advised patient to please call the office for any questions or concerns.

## 2016-09-30 ENCOUNTER — Telehealth: Payer: Self-pay | Admitting: Radiation Oncology

## 2016-09-30 NOTE — Telephone Encounter (Signed)
Phoned patient to review pap results. No answer. Left message requesting return call. Will continue to try and reach patient via phone.

## 2016-10-01 ENCOUNTER — Telehealth: Payer: Self-pay | Admitting: Radiation Oncology

## 2016-10-01 NOTE — Telephone Encounter (Signed)
Attempting again to reach patient to review lab results per Shona Cogburn, PA-C order. Again, no answer. Left message requesting return call.

## 2016-10-01 NOTE — Telephone Encounter (Signed)
Patient returned my call. Explained that per Shona Berres, PA-C her pap smears did show low grade changes but, that her immune system should be able to clear this abnormality. Advised patient to repeat PAP in one year since HPV test was negative. Patient verbalized understanding and expressed appreciation for the call.

## 2016-10-01 NOTE — Telephone Encounter (Signed)
-----   Message from Hayden Pedro, [place] sent at 09/25/2016  3:51 PM EDT ----- Denice Bors you let the patient know she had a low grade change on her paps and that her immune system should be able to clear this abormality, but doesn't require biopsy or therapy for this.  LSIL paps need to be repeated in 1 year since HPV test was negative. Thanks, Bryson Ha  ----- Message ----- From: Interface, Lab In [place] Sent: 09/25/2016   1:52 PM To: Hayden Pedro, PA-C

## 2016-10-02 ENCOUNTER — Encounter: Payer: Self-pay | Admitting: Radiation Oncology

## 2016-10-02 NOTE — Progress Notes (Signed)
Late entry from 09/24/16 at 1122. Returned [employer] Tiffany's call. She reports the lab did not receive the patient's pap smear sample until 09/23/16 though it was done 09/12/16 and delivered the same day. She confirms this doesn't affect the integrity of the sample. She explains the courier was covering for the normal courier and didn't realize [facility] had a sample. Informed Shona Lengacher, PA-C of these findings.

## 2016-10-11 ENCOUNTER — Telehealth: Payer: Self-pay | Admitting: Internal Medicine

## 2016-10-11 MED ORDER — ALBUTEROL SULFATE HFA 108 (90 BASE) MCG/ACT IN AERS: 2.0000 | INHALATION_SPRAY | RESPIRATORY_TRACT | 5 refills | Status: DC | PRN

## 2016-10-11 NOTE — Telephone Encounter (Signed)
Spoke with pt. She is needing a refill on Albuterol. Rx has been sent in. Nothing further was needed.

## 2016-11-05 ENCOUNTER — Ambulatory Visit (INDEPENDENT_AMBULATORY_CARE_PROVIDER_SITE_OTHER): Payer: Managed Care, Other (non HMO) | Admitting: Family Medicine

## 2016-11-05 ENCOUNTER — Encounter: Payer: Self-pay | Admitting: Family Medicine

## 2016-11-05 VITALS — BP 150/90 | HR 92 | Temp 98.2°F | Resp 12 | Ht 64.0 in | Wt 216.0 lb

## 2016-11-05 DIAGNOSIS — L309 Dermatitis, unspecified: Secondary | ICD-10-CM | POA: Diagnosis not present

## 2016-11-05 DIAGNOSIS — I10 Essential (primary) hypertension: Secondary | ICD-10-CM | POA: Diagnosis not present

## 2016-11-05 DIAGNOSIS — L03317 Cellulitis of buttock: Secondary | ICD-10-CM | POA: Diagnosis not present

## 2016-11-05 MED ORDER — DOXYCYCLINE HYCLATE 100 MG PO TABS: 100.0000 mg | ORAL_TABLET | Freq: Two times a day (BID) | ORAL | 0 refills | Status: AC

## 2016-11-05 MED ORDER — LIDOCAINE (ANORECTAL) 5 % EX CREA: 1.0000 "application " | TOPICAL_CREAM | Freq: Two times a day (BID) | CUTANEOUS | 0 refills | Status: DC

## 2016-11-05 NOTE — Progress Notes (Signed)
HPI:  ACUTE VISIT:  Chief Complaint  Patient presents with  . rash on back    [name] is a 60 y.o. female, who is here today complaining of a few days of worsening rash on coccygeal and perianal area.  She states that she has had this rash for a while now, she has periodic "flare ups", she has not identified exacerbating or alleviating factors.   Lesion has been painful for the past couple days, easy bleeding on affected area. She denies fever but has had some chills since yesterday.  She denies any new medication, detergent, soap, or body product. No known insect bite or outdoor exposures to plants. No sick contact. No Hx of eczema. Hx of rectal cancer s/p radiation treatment, she has had rash since then.   Medication for this problem: She has tried several topical steroid creams. She has not been consistent with topical Mometasone 0.1% cream, started yesterday, it burns. States that she has followed with PCP and dermatologists. According to patient, she also had biopsy done and it was not "cancer."  She also has history of sarcoidosis, she follows with pulmonology as an ophthalmologist.     She denies oral lesions/edema,cough, wheezing, dyspnea, abdominal pain, nausea, or vomiting.   BP is elevated today, she has history of hypertension. She is reporting BP at home "fine."  Denies severe/frequent headache, visual changes, chest pain, dyspnea, palpitation, claudication, focal weakness, or edema.   Review of Systems  Constitutional: Positive for chills and fatigue. Negative for appetite change and fever.  HENT: Negative for mouth sores, nosebleeds, sore throat and trouble swallowing.   Eyes: Negative for pain and visual disturbance.  Respiratory: Negative for cough, shortness of breath and wheezing.   Cardiovascular: Negative for chest pain, palpitations and leg swelling.  Gastrointestinal: Negative for abdominal pain, nausea and vomiting.   Negative for changes in bowel habits  Musculoskeletal: Negative for back pain, gait problem and myalgias.  Skin: Positive for rash. Negative for wound.  Neurological: Negative for weakness and headaches.  Hematological: Negative for adenopathy. Bruises/bleeds easily.  Psychiatric/Behavioral: Negative for confusion. The patient is nervous/anxious.       Current Outpatient Prescriptions on File Prior to Visit  Medication Sig Dispense Refill  . albuterol (PROVENTIL HFA;VENTOLIN HFA) 108 (90 Base) MCG/ACT inhaler Inhale 2 puffs into the lungs every 4 (four) hours as needed for wheezing or shortness of breath. 1 Inhaler 5  . bisoprolol (ZEBETA) 10 MG tablet Take 1 tablet (10 mg total) by mouth daily. 30 tablet 11  . famotidine (PEPCID) 20 MG tablet Take 20 mg by mouth at bedtime.    . hydrochlorothiazide (HYDRODIURIL) 25 MG tablet Take 1 tablet (25 mg total) by mouth daily. 30 tablet 3  . mometasone (ELOCON) 0.1 % cream Apply 1 application topically daily.    . RABEprazole (ACIPHEX) 20 MG tablet take 1 tablet by mouth once daily 30 MIN BEFORE EATING 30 tablet 2   No current facility-administered medications on file prior to visit.      Past Medical History:  Diagnosis Date  . Bifid uvula   . Cancer [facility]) 2005   rectal cancer  . Cleft lip    repaired   . GERD (gastroesophageal reflux disease)   . History of chemotherapy   . History of radiation therapy   . Hypertension   . Obesity   . Pulmonary nodule, left   . Rectal cancer ([place])   . Sarcoidosis ([place])    Allergies  Allergen Reactions  . Penicillins Hives  . Shellfish Allergy Swelling    Social History   Social History  . Marital status: Single    Spouse name: N/A  . Number of children: N/A  . Years of education: N/A   Occupational History  . 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 Smoking status: Former Smoker    Packs/day: 0.50    Years: 1.00    Types: Cigarettes    Quit date: 12/16/1993  . Smokeless  tobacco: Never Used     Comment: smoked breifly while in college  . Alcohol use Yes     Comment: ocass wine  . Drug use: No  . Sexual activity: No     Comment: reports she hasn't been sexually active for 2 years.   Other Topics Concern  . None   Social History Narrative  . None    Vitals:   11/05/16 1330  BP: (!) 150/90  Pulse: 92  Resp: 12  Temp: 98.2 F (36.8 C)   Body mass index is 37.08 kg/m.    Physical Exam  Nursing note and vitals reviewed. Constitutional: She is oriented to person, place, and time. She appears well-developed. No distress.  HENT:  Head: Atraumatic.  Mouth/Throat: Oropharynx is clear and moist and mucous membranes are normal. She has dentures. No oral lesions.  Eyes: Conjunctivae and EOM are normal.  Cardiovascular: Normal rate and regular rhythm.   No murmur heard. Respiratory: Effort normal and breath sounds normal. No respiratory distress.  Musculoskeletal: She exhibits no edema.  Neurological: She is alert and oriented to person, place, and time. Coordination and gait normal.  Skin: Skin is warm. Lesion and rash noted. No erythema.  Lichenified areas on sacrum, thin skin in between gluteus, with soft, pediculated purplish lesions, L>R. Mild induration and heat, no erythema and mild tenderness. No active bleeding.  Psychiatric: Her speech is normal. Her mood appears anxious.  Well groomed, good eye contact.      ASSESSMENT AND PLAN:     [name] was seen today for rash on back.  Diagnoses and all orders for this visit:   Dermatitis of perianal region  Chronic. S/P radiation, other causes discussed as sarcoidosis. Comment continuing topical mometasone, some side effects discussed. She can apply topical lidocaine a few minutes before topical steroid, this might help with burning/pain. OTC Desitin may also help. Strongly recommended scheduling an appointment with dermatologist.  -     Lidocaine, Anorectal, 5 % CREA; Apply 1  application topically 2 (two) times daily.  Cellulitis of buttock  Recommend keeping area clean with soap and water, avoiding scratching. Instructed about warning signs. Follow-up with PCP in one week if needed.  -     doxycycline (VIBRA-TABS) 100 MG tablet; Take 1 tablet (100 mg total) by mouth 2 (two) times daily.  Essential hypertension  She is reporting normal BP's at home. Improved after re-checking but still elevated.  No changes in current management, recommend continuing monitoring BP at home. Follow-up with PCP.      Return if symptoms worsen or fail to improve, for dermatitis with dermatologists, cellulitis with PCP in 7 days prn.     -Ms.Raelena Blando was advised to return or notify a doctor immediately if symptoms worsen or new concerns arise.       Bracken Moffa G. [name], MD  [facility]. [place] office.

## 2016-11-05 NOTE — Patient Instructions (Addendum)
  [name] I have seen you today for an acute visit.  1. Essential hypertension  Continue monitoring blood pressure, no changes. Goal < 140/90  2. Dermatitis of perianal region Please follow with dermatologists. Continue current steroid cream. You can apply lidocaine before steroid cream to help with burning  3. Cellulitis of buttock  Keep area clean with soap and water. Doxycycline can cause diarrhea, so daily probiotic. Over-the-counter Desitin might also help   In general please monitor for signs of worsening symptoms and seek immediate medical attention if any concerning/warning symptom as we discussed.   If symptoms are not resolved in a few days/weeks you should schedule a follow up appointment with your doctor, before if needed.  Please be sure you have an appointment already scheduled with your PCP before you leave today.

## 2016-11-19 ENCOUNTER — Telehealth: Payer: Self-pay | Admitting: Internal Medicine

## 2016-11-19 MED ORDER — BISOPROLOL FUMARATE 10 MG PO TABS: 10.0000 mg | ORAL_TABLET | Freq: Every day | ORAL | 5 refills | Status: DC

## 2016-11-19 MED ORDER — RABEPRAZOLE SODIUM 20 MG PO TBEC: DELAYED_RELEASE_TABLET | ORAL | 5 refills | Status: DC

## 2016-11-19 NOTE — Telephone Encounter (Signed)
Spoke with pt. She needs refills on HCTZ, bisoprolol and Aciphex. Advised her that HCTZ is not prescribed by [facility] so that we will not be able to refill this medication. The other 2 prescriptions have been sent in. Nothing further was needed.

## 2016-11-21 ENCOUNTER — Ambulatory Visit: Payer: Managed Care, Other (non HMO) | Admitting: Internal Medicine

## 2016-11-24 NOTE — H&P (Signed)
  [name] is an 60 y.o. female.   Chief Complaint: CUBITAL TUNNEL SYNDROME OF RIGHT UPPER EXTREMITY HPI: PATIENT COMPLAINS OF NUMBNESS AND TINGLING OF RIGHT RING AND SMALL FINGER.  EMG/NCV COMPLETED ON 09/30/16. REVIEWED THE RESULTS WITH THE PATIENT AND DISCUSSED DIFFERENT TREATMENT OPTIONS.  PATIENT WOULD LIKE TO PROCEED WITH SURGICAL INTERVENTION. PATIENT IS HERE TODAY FOR SURGERY.  Past Medical History:  Diagnosis Date  . Bifid uvula   . Cancer [facility]) 2005   rectal cancer  . Cleft lip    repaired   . GERD (gastroesophageal reflux disease)   . History of chemotherapy   . History of radiation therapy   . Hypertension   . Obesity   . Pulmonary nodule, left   . Rectal cancer ([place])   . Sarcoidosis [facility])     Past Surgical History:  Procedure Laterality Date  . excision  of anal cancer      Family History  Problem Relation Age of Onset  . Allergies Mother   . Asthma Mother   . Allergies Sister   . Allergies Brother   . Asthma Brother   . Asthma Sister   . Sarcoidosis Neg Hx    Social History:  reports that she quit smoking about 22 years ago. Her smoking use included Cigarettes. She has a 0.50 pack-year smoking history. She has never used smokeless tobacco. She reports that she drinks alcohol. She reports that she does not use drugs.  Allergies:  Allergies  Allergen Reactions  . Penicillins Hives  . Shellfish Allergy Swelling    No prescriptions prior to admission.    No results found for this or any previous visit (from the past 48 hour(s)). No results found.  ROS NO RECENT ILLNESSES OR HOSPITALIZATIONS  There were no vitals taken for this visit. Physical Exam  General Appearance:  Alert, cooperative, no distress, appears stated age  Head:  Normocephalic, without obvious abnormality, atraumatic  Eyes:  Pupils equal, conjunctiva/corneas clear,         Throat: Lips, mucosa, and tongue normal; teeth and gums normal  Neck: No visible masses     Lungs:    respirations unlabored  Chest Wall:  No tenderness or deformity  Heart:  Regular rate and rhythm,  Abdomen:   Soft, non-tender,         Extremities: RUE: SKIN INTACT, FINGERS WARM WELL PERFUSED GOOD HAND INTRINSIC STRENGTH NO ULNAR NERVE SUBLUXATION  Pulses: 2+ and symmetric  Skin: Skin color, texture, turgor normal, no rashes or lesions     Neurologic: Normal    Assessment CUBITAL TUNNEL SYNDROME OF RIGHT UPPER EXTREMITY  Plan RIGHT ELBOW ULNAR NERVE RELEASE AND/OR TRANSPOSITION  R/B/A DISCUSSED WITH PT IN OFFICE.  PT VOICED UNDERSTANDING OF PLAN CONSENT SIGNED DAY OF SURGERY PT SEEN AND EXAMINED PRIOR TO OPERATIVE PROCEDURE/DAY OF SURGERY SITE MARKED. QUESTIONS ANSWERED WILL GO HOME FOLLOWING SURGERY  WE ARE PLANNING SURGERY FOR YOUR UPPER EXTREMITY. THE RISKS AND BENEFITS OF SURGERY INCLUDE BUT NOT LIMITED TO BLEEDING INFECTION, DAMAGE TO NEARBY NERVES ARTERIES TENDONS, FAILURE OF SURGERY TO ACCOMPLISH ITS INTENDED GOALS, PERSISTENT SYMPTOMS AND NEED FOR FURTHER SURGICAL INTERVENTION. WITH THIS IN MIND WE WILL PROCEED. I HAVE DISCUSSED WITH THE PATIENT THE PRE AND POSTOPERATIVE REGIMEN AND THE DOS AND DON'TS. PT VOICED UNDERSTANDING AND INFORMED CONSENT SIGNED.    Brynda Peon 11/24/2016, 2:03 PM

## 2016-11-27 ENCOUNTER — Encounter (HOSPITAL_BASED_OUTPATIENT_CLINIC_OR_DEPARTMENT_OTHER): Payer: Self-pay | Admitting: *Deleted

## 2016-11-27 NOTE — Progress Notes (Signed)
NPO AFTER MN W/ EXCEPTION CLEAR LIQUIDS UNTIL 0900 (NO CREAM /MILK PRODUCTS).  ARRIVE AT 1315.  NEED ISTAT 8 AND EKG.  WILL TAKE BISOPROLOL AND ACIPHEX AM DSO W/ SIPS OF WATER.  CURRENT CXR IN CHART AND EPIC.

## 2016-11-28 ENCOUNTER — Other Ambulatory Visit: Payer: Self-pay

## 2016-11-28 ENCOUNTER — Encounter (HOSPITAL_BASED_OUTPATIENT_CLINIC_OR_DEPARTMENT_OTHER)
Admission: RE | Disposition: A | Discharge status: Home / Self Care | Payer: Self-pay | Source: Ambulatory Visit | Attending: Orthopedic Surgery

## 2016-11-28 ENCOUNTER — Ambulatory Visit (HOSPITAL_BASED_OUTPATIENT_CLINIC_OR_DEPARTMENT_OTHER): Payer: Managed Care, Other (non HMO) | Admitting: Anesthesiology

## 2016-11-28 ENCOUNTER — Ambulatory Visit (HOSPITAL_BASED_OUTPATIENT_CLINIC_OR_DEPARTMENT_OTHER)
Admission: RE | Admit: 2016-11-28 | Discharge: 2016-11-28 | Disposition: A | Discharge status: Home / Self Care | Payer: Managed Care, Other (non HMO) | Source: Ambulatory Visit | Attending: Orthopedic Surgery | Admitting: Orthopedic Surgery

## 2016-11-28 ENCOUNTER — Encounter (HOSPITAL_BASED_OUTPATIENT_CLINIC_OR_DEPARTMENT_OTHER): Payer: Self-pay | Admitting: *Deleted

## 2016-11-28 DIAGNOSIS — D860 Sarcoidosis of lung: Secondary | ICD-10-CM | POA: Insufficient documentation

## 2016-11-28 DIAGNOSIS — Z85048 Personal history of other malignant neoplasm of rectum, rectosigmoid junction, and anus: Secondary | ICD-10-CM | POA: Diagnosis not present

## 2016-11-28 DIAGNOSIS — G5621 Lesion of ulnar nerve, right upper limb: Secondary | ICD-10-CM

## 2016-11-28 DIAGNOSIS — Z6837 Body mass index (BMI) 37.0-37.9, adult: Secondary | ICD-10-CM | POA: Diagnosis not present

## 2016-11-28 DIAGNOSIS — K219 Gastro-esophageal reflux disease without esophagitis: Secondary | ICD-10-CM | POA: Insufficient documentation

## 2016-11-28 DIAGNOSIS — I10 Essential (primary) hypertension: Secondary | ICD-10-CM | POA: Insufficient documentation

## 2016-11-28 DIAGNOSIS — Z87891 Personal history of nicotine dependence: Secondary | ICD-10-CM | POA: Insufficient documentation

## 2016-11-28 DIAGNOSIS — Z7952 Long term (current) use of systemic steroids: Secondary | ICD-10-CM | POA: Diagnosis not present

## 2016-11-28 DIAGNOSIS — E669 Obesity, unspecified: Secondary | ICD-10-CM | POA: Diagnosis not present

## 2016-11-28 HISTORY — DX: Other lesions of median nerve, right upper limb: G56.11

## 2016-11-28 HISTORY — DX: Sarcoidosis of lung: D86.0

## 2016-11-28 HISTORY — DX: Sarcoid iridocyclitis: D86.83

## 2016-11-28 HISTORY — DX: Personal history of cervical dysplasia: Z87.410

## 2016-11-28 HISTORY — DX: Personal history of other malignant neoplasm of rectum, rectosigmoid junction, and anus: Z85.048

## 2016-11-28 HISTORY — PX: TENNIS ELBOW RELEASE/NIRSCHEL PROCEDURE: SHX6651

## 2016-11-28 HISTORY — DX: Presence of spectacles and contact lenses: Z97.3

## 2016-11-28 HISTORY — DX: Personal history of (corrected) cleft lip and palate: Z87.730

## 2016-11-28 HISTORY — DX: Other specified cough: R05.8

## 2016-11-28 HISTORY — DX: Cough: R05

## 2016-11-28 HISTORY — DX: Personal history of vulvar dysplasia: Z87.412

## 2016-11-28 HISTORY — DX: Personal history of other infectious and parasitic diseases: Z86.19

## 2016-11-28 LAB — POCT I-STAT, CHEM 8
BUN: 17 mg/dL (ref 6–20)
CHLORIDE: 102 mmol/L (ref 101–111)
Calcium, Ion: 1.23 mmol/L (ref 1.15–1.40)
Creatinine, Ser: 0.7 mg/dL (ref 0.44–1.00)
GLUCOSE: 98 mg/dL (ref 65–99)
HCT: 35 % — ABNORMAL LOW (ref 36.0–46.0)
Hemoglobin: 11.9 g/dL — ABNORMAL LOW (ref 12.0–15.0)
POTASSIUM: 3.8 mmol/L (ref 3.5–5.1)
Sodium: 141 mmol/L (ref 135–145)
TCO2: 28 mmol/L (ref 0–100)

## 2016-11-28 SURGERY — TENNIS ELBOW RELEASE/NIRSCHEL PROCEDURE
Anesthesia: General | Site: Elbow | Laterality: Right

## 2016-11-28 MED ORDER — FENTANYL CITRATE (PF) 100 MCG/2ML IJ SOLN
INTRAMUSCULAR | Status: DC | PRN
  Administered 2016-11-28 (×8): 25 ug via INTRAVENOUS

## 2016-11-28 MED ORDER — LIDOCAINE HCL (CARDIAC) 20 MG/ML IV SOLN
INTRAVENOUS | Status: DC | PRN
  Administered 2016-11-28: 100 mg via INTRAVENOUS

## 2016-11-28 MED ORDER — DEXAMETHASONE SODIUM PHOSPHATE 4 MG/ML IJ SOLN
INTRAMUSCULAR | Status: DC | PRN
  Administered 2016-11-28: 5 mg via INTRAVENOUS

## 2016-11-28 MED ORDER — LIDOCAINE 2% (20 MG/ML) 5 ML SYRINGE
INTRAMUSCULAR | Status: AC
  Filled 2016-11-28: qty 10

## 2016-11-28 MED ORDER — BUPIVACAINE HCL (PF) 0.25 % IJ SOLN
INTRAMUSCULAR | Status: DC | PRN
Start: 2016-11-28 — End: 2016-11-28
  Administered 2016-11-28: 10 mL

## 2016-11-28 MED ORDER — OXYCODONE HCL 5 MG/5ML PO SOLN
5.0000 mg | Freq: Once | ORAL | Status: DC | PRN
Start: 2016-11-28 — End: 2016-11-28
  Filled 2016-11-28: qty 5

## 2016-11-28 MED ORDER — LACTATED RINGERS IV SOLN
INTRAVENOUS | Status: DC
  Administered 2016-11-28 (×2): via INTRAVENOUS
  Filled 2016-11-28: qty 1000

## 2016-11-28 MED ORDER — CLINDAMYCIN PHOSPHATE 900 MG/50ML IV SOLN
INTRAVENOUS | Status: AC
  Filled 2016-11-28: qty 50

## 2016-11-28 MED ORDER — FENTANYL CITRATE (PF) 100 MCG/2ML IJ SOLN
INTRAMUSCULAR | Status: AC
  Filled 2016-11-28: qty 2

## 2016-11-28 MED ORDER — SODIUM CHLORIDE 0.9 % IR SOLN
Status: DC | PRN
  Administered 2016-11-28: 500 mL

## 2016-11-28 MED ORDER — FENTANYL CITRATE (PF) 100 MCG/2ML IJ SOLN
25.0000 ug | INTRAMUSCULAR | Status: DC | PRN
  Administered 2016-11-28 (×2): 25 ug via INTRAVENOUS
  Filled 2016-11-28: qty 1

## 2016-11-28 MED ORDER — CLINDAMYCIN PHOSPHATE 900 MG/50ML IV SOLN
900.0000 mg | INTRAVENOUS | Status: AC
  Administered 2016-11-28: 900 mg via INTRAVENOUS
  Filled 2016-11-28: qty 50

## 2016-11-28 MED ORDER — METHOCARBAMOL 500 MG PO TABS: 500.0000 mg | ORAL_TABLET | Freq: Four times a day (QID) | ORAL | 0 refills | Status: DC

## 2016-11-28 MED ORDER — OXYCODONE HCL 5 MG PO TABS
5.0000 mg | ORAL_TABLET | Freq: Once | ORAL | Status: DC | PRN
  Filled 2016-11-28: qty 1

## 2016-11-28 MED ORDER — MIDAZOLAM HCL 5 MG/5ML IJ SOLN
INTRAMUSCULAR | Status: DC | PRN
  Administered 2016-11-28: 2 mg via INTRAVENOUS

## 2016-11-28 MED ORDER — CHLORHEXIDINE GLUCONATE 4 % EX LIQD
60.0000 mL | Freq: Once | CUTANEOUS | Status: AC
  Administered 2016-11-28: 4 via TOPICAL
  Filled 2016-11-28: qty 118

## 2016-11-28 MED ORDER — PROMETHAZINE HCL 25 MG/ML IJ SOLN
6.2500 mg | INTRAMUSCULAR | Status: DC | PRN
  Filled 2016-11-28: qty 1

## 2016-11-28 MED ORDER — MIDAZOLAM HCL 2 MG/2ML IJ SOLN
INTRAMUSCULAR | Status: AC
Start: 2016-11-28 — End: 2016-11-28
  Filled 2016-11-28: qty 2

## 2016-11-28 MED ORDER — DEXAMETHASONE SODIUM PHOSPHATE 10 MG/ML IJ SOLN
INTRAMUSCULAR | Status: AC
  Filled 2016-11-28: qty 1

## 2016-11-28 MED ORDER — KETOROLAC TROMETHAMINE 30 MG/ML IJ SOLN
INTRAMUSCULAR | Status: DC | PRN
  Administered 2016-11-28: 30 mg via INTRAVENOUS

## 2016-11-28 MED ORDER — PROPOFOL 10 MG/ML IV BOLUS
INTRAVENOUS | Status: DC | PRN
  Administered 2016-11-28: 200 mg via INTRAVENOUS

## 2016-11-28 MED ORDER — ONDANSETRON HCL 4 MG/2ML IJ SOLN
INTRAMUSCULAR | Status: AC
  Filled 2016-11-28: qty 2

## 2016-11-28 MED ORDER — PROPOFOL 10 MG/ML IV BOLUS
INTRAVENOUS | Status: AC
  Filled 2016-11-28: qty 20

## 2016-11-28 MED ORDER — HYDROCODONE-ACETAMINOPHEN 5-300 MG PO TABS: 1.0000 | ORAL_TABLET | Freq: Three times a day (TID) | ORAL | 0 refills | Status: DC | PRN

## 2016-11-28 MED ORDER — ONDANSETRON HCL 4 MG/2ML IJ SOLN
INTRAMUSCULAR | Status: DC | PRN
  Administered 2016-11-28: 4 mg via INTRAVENOUS

## 2016-11-28 MED ORDER — KETOROLAC TROMETHAMINE 30 MG/ML IJ SOLN
30.0000 mg | Freq: Once | INTRAMUSCULAR | Status: DC | PRN
  Filled 2016-11-28: qty 1

## 2016-11-28 SURGICAL SUPPLY — 59 items
BANDAGE ELASTIC 3 VELCRO ST LF (GAUZE/BANDAGES/DRESSINGS) ×2 IMPLANT
BANDAGE ELASTIC 4 VELCRO ST LF (GAUZE/BANDAGES/DRESSINGS) ×2 IMPLANT
BENZOIN TINCTURE PRP APPL 2/3 (GAUZE/BANDAGES/DRESSINGS) ×2 IMPLANT
BLADE SURG 15 STRL LF DISP TIS (BLADE) ×1 IMPLANT
BLADE SURG 15 STRL SS (BLADE) ×1
BNDG GAUZE ELAST 4 BULKY (GAUZE/BANDAGES/DRESSINGS) ×2 IMPLANT
CORDS BIPOLAR (ELECTRODE) ×2 IMPLANT
COVER BACK TABLE 60X90IN (DRAPES) ×2 IMPLANT
CUFF TOURNIQUET SINGLE 18IN (TOURNIQUET CUFF) IMPLANT
DRAIN PENROSE 18X1/4 LTX STRL (WOUND CARE) IMPLANT
DRAPE EXTREMITY T 121X128X90 (DRAPE) ×2 IMPLANT
DRAPE SURG 17X23 STRL (DRAPES) ×2 IMPLANT
GAUZE XEROFORM 1X8 LF (GAUZE/BANDAGES/DRESSINGS) ×2 IMPLANT
GLOVE BIO SURGEON STRL SZ7 (GLOVE) ×2 IMPLANT
GLOVE BIOGEL PI IND STRL 8.5 (GLOVE) ×1 IMPLANT
GLOVE BIOGEL PI INDICATOR 8.5 (GLOVE) ×1
GLOVE ECLIPSE 6.5 STRL STRAW (GLOVE) ×4 IMPLANT
GLOVE INDICATOR 6.5 STRL GRN (GLOVE) ×4 IMPLANT
GLOVE INDICATOR 7.5 STRL GRN (GLOVE) ×4 IMPLANT
GLOVE SURG ORTHO 8.0 STRL STRW (GLOVE) ×2 IMPLANT
GOWN STRL REUS W/ TWL LRG LVL3 (GOWN DISPOSABLE) ×2 IMPLANT
GOWN STRL REUS W/ TWL XL LVL3 (GOWN DISPOSABLE) ×2 IMPLANT
GOWN STRL REUS W/TWL 2XL LVL3 (GOWN DISPOSABLE) ×2 IMPLANT
GOWN STRL REUS W/TWL LRG LVL3 (GOWN DISPOSABLE) ×2
GOWN STRL REUS W/TWL XL LVL3 (GOWN DISPOSABLE) ×2
KIT ROOM TURNOVER WOR (KITS) ×2 IMPLANT
LOOP VESSEL MAXI BLUE (MISCELLANEOUS) IMPLANT
LOOP VESSEL MINI RED (MISCELLANEOUS) IMPLANT
NEEDLE HYPO 25X1 1.5 SAFETY (NEEDLE) IMPLANT
NS IRRIG 500ML POUR BTL (IV SOLUTION) ×2 IMPLANT
PACK BASIN DAY SURGERY FS (CUSTOM PROCEDURE TRAY) ×2 IMPLANT
PAD CAST 4YDX4 CTTN HI CHSV (CAST SUPPLIES) IMPLANT
PADDING CAST ABS 4INX4YD NS (CAST SUPPLIES) ×1
PADDING CAST ABS COTTON 4X4 ST (CAST SUPPLIES) ×1 IMPLANT
PADDING CAST COTTON 4X4 STRL (CAST SUPPLIES)
SPLINT FAST PLASTER 5X30 (CAST SUPPLIES)
SPLINT FIBERGLASS 3X35 (CAST SUPPLIES) ×2 IMPLANT
SPLINT FIBERGLASS 4X30 (CAST SUPPLIES) IMPLANT
SPLINT PLASTER CAST FAST 5X30 (CAST SUPPLIES) IMPLANT
SPLINT PLASTER CAST XFAST 3X15 (CAST SUPPLIES) ×1 IMPLANT
SPLINT PLASTER XTRA FASTSET 3X (CAST SUPPLIES) ×1
SPONGE GAUZE 4X4 12PLY (GAUZE/BANDAGES/DRESSINGS) ×2 IMPLANT
STOCKINETTE 4X48 STRL (DRAPES) ×2 IMPLANT
STRIP CLOSURE SKIN 1/2X4 (GAUZE/BANDAGES/DRESSINGS) ×2 IMPLANT
SUCTION FRAZIER HANDLE 10FR (MISCELLANEOUS)
SUCTION TUBE FRAZIER 10FR DISP (MISCELLANEOUS) IMPLANT
SUT ETHIBOND 3-0 V-5 (SUTURE) IMPLANT
SUT ETHILON 4 0 PS 2 18 (SUTURE) IMPLANT
SUT MERSILENE 4 0 P 3 (SUTURE) IMPLANT
SUT PROLENE 4 0 PS 2 18 (SUTURE) IMPLANT
SUT VIC AB 2-0 SH 27 (SUTURE)
SUT VIC AB 2-0 SH 27XBRD (SUTURE) IMPLANT
SUT VICRYL 4-0 PS2 18IN ABS (SUTURE) IMPLANT
SYR BULB 3OZ (MISCELLANEOUS) ×2 IMPLANT
SYR CONTROL 10ML LL (SYRINGE) IMPLANT
TAPE STRIPS DRAPE STRL (GAUZE/BANDAGES/DRESSINGS) ×2 IMPLANT
TOWEL OR 17X24 6PK STRL BLUE (TOWEL DISPOSABLE) ×4 IMPLANT
TUBE CONNECTING 12X1/4 (SUCTIONS) IMPLANT
UNDERPAD 30X30 INCONTINENT (UNDERPADS AND DIAPERS) ×2 IMPLANT

## 2016-11-28 NOTE — Transfer of Care (Signed)
Immediate Anesthesia Transfer of Care Note  Patient: [name]  Procedure(s) Performed: Procedure(s) (LRB): RIGHT ELBOW ULNER NERVE RELEASE AND OR TRANSPOSITION (Right)  Patient Location: PACU  Anesthesia Type: General  Level of Consciousness: awake, sedated, patient cooperative and responds to stimulation  Airway & Oxygen Therapy: Patient Spontanous Breathing and Patient connected to face mask oxygen  Post-op Assessment: Report given to PACU RN, Post -op Vital signs reviewed and stable and Patient moving all extremities  Post vital signs: Reviewed and stable  Complications: No apparent anesthesia complications

## 2016-11-28 NOTE — Anesthesia Postprocedure Evaluation (Signed)
Anesthesia Post Note  Patient: [name]  Procedure(s) Performed: Procedure(s) (LRB): RIGHT ELBOW ULNER NERVE RELEASE AND OR TRANSPOSITION (Right)  Patient location during evaluation: PACU Anesthesia Type: General Level of consciousness: awake and alert and oriented Pain management: pain level controlled Vital Signs Assessment: post-procedure vital signs reviewed and stable Respiratory status: spontaneous breathing, nonlabored ventilation and respiratory function stable Cardiovascular status: blood pressure returned to baseline and stable Postop Assessment: no signs of nausea or vomiting Anesthetic complications: no    Last Vitals:  Vitals:   11/28/16 1326 11/28/16 1635  BP: (!) 182/83 (!) 152/70  Pulse: 71 78  Resp: 16 16  Temp: 36.8 C 36.8 C    Last Pain:  Vitals:   11/28/16 1730  TempSrc:   PainSc: 2                  Sherra Kimmons A.

## 2016-11-28 NOTE — Discharge Instructions (Signed)
KEEP BANDAGE CLEAN AND DRY CALL OFFICE FOR F/U APPT [phone] IN 14 DAYS DR Caralyn Guile CELL [phone] KEEP HAND ELEVATED ABOVE HEART OK TO APPLY ICE TO OPERATIVE AREA CONTACT OFFICE IF ANY WORSENING PAIN OR CONCERNS. Post Anesthesia Home Care Instructions  Activity: Get plenty of rest for the remainder of the day. A responsible adult should stay with you for 24 hours following the procedure.  For the next 24 hours, DO NOT: -Drive a car -Paediatric nurse -Drink alcoholic beverages -Take any medication unless instructed by your physician -Make any legal decisions or sign important papers.  Meals: Start with liquid foods such as gelatin or soup. Progress to regular foods as tolerated. Avoid greasy, spicy, heavy foods. If nausea and/or vomiting occur, drink only clear liquids until the nausea and/or vomiting subsides. Call your physician if vomiting continues.  Special Instructions/Symptoms: Your throat may feel dry or sore from the anesthesia or the breathing tube placed in your throat during surgery. If this causes discomfort, gargle with warm salt water. The discomfort should disappear within 24 hours.  If you had a scopolamine patch placed behind your ear for the management of post- operative nausea and/or vomiting:  1. The medication in the patch is effective for 72 hours, after which it should be removed.  Wrap patch in a tissue and discard in the trash. Wash hands thoroughly with soap and water. 2. You may remove the patch earlier than 72 hours if you experience unpleasant side effects which may include dry mouth, dizziness or visual disturbances. 3. Avoid touching the patch. Wash your hands with soap and water after contact with the patch.

## 2016-11-28 NOTE — Anesthesia Procedure Notes (Signed)
Procedure Name: LMA Insertion Date/Time: 11/28/2016 3:07 PM Performed by: Justice Rocher Pre-anesthesia Checklist: Patient identified, Emergency Drugs available, Suction available and Patient being monitored Patient Re-evaluated:Patient Re-evaluated prior to inductionOxygen Delivery Method: Circle system utilized Preoxygenation: Pre-oxygenation with 100% oxygen Intubation Type: IV induction Ventilation: Mask ventilation without difficulty LMA: LMA inserted LMA Size: 4.0 Number of attempts: 1 Airway Equipment and Method: Bite block Placement Confirmation: positive ETCO2 Tube secured with: Tape Dental Injury: Teeth and Oropharynx as per pre-operative assessment

## 2016-11-28 NOTE — Anesthesia Preprocedure Evaluation (Signed)
Anesthesia Evaluation  Patient identified by MRN, date of birth, ID band Patient awake    Reviewed: Allergy & Precautions, NPO status , Patient's Chart, lab work & pertinent test results  Airway Mallampati: II  TM Distance: >3 FB Neck ROM: Full    Dental no notable dental hx.    Pulmonary former smoker,  Pulmonary sarcoidosis ([place]) since [place]    Pulmonary exam normal breath sounds clear to auscultation       Cardiovascular hypertension, Normal cardiovascular exam Rhythm:Regular Rate:Normal     Neuro/Psych negative neurological ROS  negative psych ROS   GI/Hepatic Neg liver ROS, GERD  Medicated,  Endo/Other  negative endocrine ROS  Renal/GU negative Renal ROS  negative genitourinary   Musculoskeletal negative musculoskeletal ROS (+)   Abdominal   Peds negative pediatric ROS (+)  Hematology negative hematology ROS (+)   Anesthesia Other Findings   Reproductive/Obstetrics negative OB ROS                             Anesthesia Physical Anesthesia Plan  ASA: III  Anesthesia Plan: General   Post-op Pain Management:    Induction: Intravenous  Airway Management Planned: LMA  Additional Equipment:   Intra-op Plan:   Post-operative Plan: Extubation in OR  Informed Consent: I have reviewed the patients History and Physical, chart, labs and discussed the procedure including the risks, benefits and alternatives for the proposed anesthesia with the patient or authorized representative who has indicated his/her understanding and acceptance.   Dental advisory given  Plan Discussed with: CRNA and Surgeon  Anesthesia Plan Comments:         Anesthesia Quick Evaluation

## 2016-11-29 ENCOUNTER — Encounter (HOSPITAL_BASED_OUTPATIENT_CLINIC_OR_DEPARTMENT_OTHER): Payer: Self-pay | Admitting: Orthopedic Surgery

## 2016-12-02 NOTE — Op Note (Signed)
NAME:  [name], [name]                    ACCOUNT NO.:  MEDICAL RECORD NO.:  [id number]  LOCATION:                                 FACILITY:  PHYSICIAN:  Linna Hoff IV, M.D.DATE OF BIRTH:  [birth year]  DATE OF PROCEDURE:  11/28/2016 DATE OF DISCHARGE:                              OPERATIVE REPORT   PREOPERATIVE DIAGNOSIS:  Right elbow ulnar nerve compression.  POSTOPERATIVE DIAGNOSIS:  Right elbow ulnar nerve compression.  ATTENDING PHYSICIAN:  Linna Hoff, M.D. who scrubbed and present for the entire procedure.  ASSISTANT SURGEON:  Gertie Fey, PA-C, who was scrubbed and necessary for the entire procedure to help aid in nerve exposure, decompression, closure, and application of splinting, and expedition of the case.  SURGICAL PROCEDURE:  Right elbow ulnar nerve release in situ, ulnar nerve decompression.  ANESTHESIA:  General via LMA.  SURGICAL INDICATIONS:  [name] is a right-hand-dominant female with signs and symptoms consistent with right elbow ulnar nerve compression. The patient elected to undergo the above procedure.  Risks, benefits, and alternatives were discussed in detail with the patient and signed informed consent was obtained.  Risks include, but not limited to bleeding, infection, damage to nearby nerves, arteries, or tendons; loss of motion of wrist and digits, incomplete relief of the symptoms, and need for further surgical intervention.  DESCRIPTION OF PROCEDURE:  The patient was properly identified in the preoperative holding area and mark with a permanent marker made on the right elbow to indicate correct operative site.  The patient was then brought back to the operating room, placed supine on anesthesia room table where general anesthesia was administered.  The patient tolerated this well.  A well-padded tourniquet placed in a right brachium and sealed with 1000 drape.  Right upper extremity was then prepped and draped in normal  sterile fashion.  Time-out was called, correct side was identified, and procedure then begun.  Attention was then turned to the right elbow.  Curvilinear incision made posterior and pertaining to medial condyle olecranon.  Dissection was carried down through the skin and subcutaneous tissue.  Deep dissection was carried through the subcutaneous tissue all the way down to the fascia.  The ulnar nerve was then identified proximally.  Going through the fascial layer proximally, the ulnar nerve was then released approximately 10-15 cm proximal to the medial epicondyle.  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  This was then excised with careful protection of the nerve and further dissection was then carried out distally through the cubital tunnel through Osborne's ligament through the FCU fascia.  After excision of the anomalous musculature and decompression of the nerve proximally and distally, the elbow was then placed through a full range of motion and there did not appear to be any evidence of ulnar nerve subluxation. Dissection was then carried out proximally, portion of the medial intermuscular septum was then resected proximally.  The nerve was then again placed through a full range of motion,  again no evidence of subluxation.  Tourniquet was deflated.  Hemostasis was obtained. Subcutaneous tissue was closed with Monocryl and skin closed with running 4-0 Prolene subcuticular.  Steri-Strips were applied.  10 mL of 0.25% Marcaine with epi was then infiltrated.  Sterile compressive bandage was then applied.  The patient was then placed in a long-arm posterior splint, extubated, and taken to the recovery room in good condition.  POSTPROCEDURAL PLAN:  The patient will be discharged to  home, seen back in the office in approximately 2 weeks for wound check, suture removal, and begin a postoperative ulnar nerve in situ release protocol.     [name], M.D.     FWO/MEDQ  D:  11/28/2016  T:  11/29/2016  Job:  [id number]

## 2017-01-27 ENCOUNTER — Telehealth: Payer: Self-pay | Admitting: Internal Medicine

## 2017-01-27 MED ORDER — AZITHROMYCIN 250 MG PO TABS: ORAL_TABLET | ORAL | 0 refills | Status: AC

## 2017-01-27 NOTE — Telephone Encounter (Signed)
MW  Please Advise-  Pt. Called in today c/o of sob, and coughing with phlegm that is a greenish brownish color, some wheezing, Denies fever,chest tightness. She wanted to know if something could be called in.  1. Tanda Rockers, MD (Physician) at 08/22/2016 12:26 PM - Signed    Try not to clear your throat  - keep sugarless candy handy but no mint menthol or chocolate   Please remember to go to the   x-ray department downstairs for your tests - we will call you with the results when they are available.  Please schedule a follow up visit in 3 months but call sooner if needed

## 2017-01-27 NOTE — Telephone Encounter (Signed)
Zpak, then ov with all meds in hand if not better by end of week / ok to see NP

## 2017-01-27 NOTE — Telephone Encounter (Signed)
Pt aware of MW's recommendations and voiced her understanding. Rx sent to preferred pharmacy. Nothing further needed.  

## 2017-02-12 ENCOUNTER — Other Ambulatory Visit: Payer: Self-pay | Admitting: Family

## 2017-02-17 DIAGNOSIS — E782 Mixed hyperlipidemia: Secondary | ICD-10-CM | POA: Insufficient documentation

## 2017-02-17 NOTE — Progress Notes (Signed)
HPI:   [name] is a 61 y.o. female, who is here today to follow on some chronic medical problems.  I saw her on 11/05/16 for acute visit.  Hypertension:  Currently on Bisoprolol 10 mg and HCTZ 25 mg daily.  At the ortho's office 210/190's. She is not checking BP at home.  She is taking medications as instructed, no side effects reported.  She has not noted unusual headache, visual changes, exertional chest pain,or  focal weakness. She has not followed low salt diet.  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 She wonders if this medication could be increasing her BP.   Hyperlipidemia:  Currently on non pharmacologic treatment. Following a low fat diet: No.    Lab Results  Component Value Date   CHOL 243 (H) 06/27/2015   HDL 33.60 (L) 06/27/2015   LDLCALC (H) 01/21/2008    140        Total Cholesterol/HDL:CHD Risk Coronary Heart Disease Risk Table                     Men   Women  1/2 Average Risk   3.4   3.3   LDLDIRECT 146.0 06/27/2015   TRIG 259.0 (H) 06/27/2015   CHOLHDL 7 06/27/2015     She follows with pulmonologist, Hx of sarcoidosis.Exertional dyspnea stable otherwise. 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  Lab Results  Component Value Date   TSH 2.85 06/27/2015    Concerns today:   Dry mouth: For about a month. She denies polyuria. She is eating more and she is not follow low salt diet.  "Extremely dry", concerned about diabetes.   LE edema, bilateral,it has been going on for a few years,intermitently but seems worse for the past few days. She denies pain or erythema. Edema is alleviated by elevating of LE. States that in the morning when she fist gets up is "almost gone",worse at the end  of the day She denies gross hematuria,foam in urine,decreased urine out put, orthopnea,or PND.  She recently travel, drove for about 2-3 hours. She was walking more and eating out, high salt diet.  She also mentions that she has gained wt in the past few months. She is not exercising regularly.  -Requesting refills on topical medication she uses intermittent for lower back rash.This Rx was given by a former PCP, Triamcinolone-Eucerin.Currently asymptomatic.     Review of Systems  Constitutional: Negative for appetite change, fatigue, fever and unexpected weight change.  HENT: Negative for mouth sores, nosebleeds and trouble swallowing.   Eyes: Negative for redness and visual disturbance.  Respiratory: Positive for shortness of breath (exertional,stable). Negative for cough and wheezing.   Cardiovascular: Positive for leg swelling. Negative for chest pain and palpitations.  Gastrointestinal: Negative for abdominal pain, nausea and vomiting.       Negative for changes in bowel habits.  Endocrine: Positive for polydipsia. Negative for polyphagia and polyuria.  Genitourinary: Negative for decreased urine volume, dysuria and hematuria.  Musculoskeletal: Negative for gait problem and myalgias.  Skin: Negative for pallor and wound.  Neurological: Negative for syncope, weakness and headaches.  Psychiatric/Behavioral: Negative for confusion. The patient is nervous/anxious.  Current Outpatient Prescriptions on File Prior to Visit  Medication Sig Dispense Refill  . albuterol (PROVENTIL HFA;VENTOLIN HFA) 108 (90 Base) MCG/ACT inhaler Inhale 2 puffs into the lungs every 4 (four) hours as needed for wheezing or shortness of breath. 1 Inhaler 5  . bisoprolol (ZEBETA) 10 MG tablet Take 1 tablet (10 mg total) by mouth daily. (Patient taking differently: Take 10 mg by mouth daily with breakfast. ) 30 tablet 5  . famotidine (PEPCID) 20 MG tablet Take 20 mg by mouth at bedtime.    Marland Kitchen  Hydrocodone-Acetaminophen (VICODIN) 5-300 MG TABS Take 1 tablet by mouth 3 (three) times daily as needed (PAIN). 30 each 0  . Lidocaine, Anorectal, 5 % CREA Apply 1 application topically 2 (two) times daily. 30 g 0  . methocarbamol (ROBAXIN) 500 MG tablet Take 1 tablet (500 mg total) by mouth 4 (four) times daily. 30 tablet 0  . mometasone (ELOCON) 0.1 % cream Apply 1 application topically as needed.     . RABEprazole (ACIPHEX) 20 MG tablet take 1 tablet by mouth once daily 30 MIN BEFORE EATING (Patient taking differently: Take 20 mg by mouth every morning. take 1 tablet by mouth once daily 30 MIN BEFORE EATING) 30 tablet 5   No current facility-administered medications on file prior to visit.      Past Medical History:  Diagnosis Date  . Bifid uvula   . GERD (gastroesophageal reflux disease)   . History of cervical dysplasia   . History of chemotherapy    06/ 2005  . History of cleft lip    CORRECTED  . History of condyloma acuminatum    PERIANAL  . History of radiation therapy    completed 06/ 2005  . 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 History of vulvar dysplasia   . Hypertension   . 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 Pulmonary nodule, left   . 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 Upper airway cough syndrome   . Wears glasses    Allergies  Allergen Reactions  . Shellfish Allergy Itching and Swelling  . Penicillins Hives and Itching    Social History   Social History  . Marital status: Single    Spouse name: N/A  . Number of children: N/A  . Years of education: N/A   Occupational History  . 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 Smoking status: Former Smoker    Packs/day: 0.50    Years: 1.00    Types:  Cigarettes    Quit date: 12/16/1993  . Smokeless tobacco: Never Used  . Alcohol use Yes     Comment: occasional  . Drug use: No  . Sexual activity: No   Other Topics Concern  . None   Social History Narrative  . None    Vitals:   02/18/17 1121  BP: (!) 160/100  Pulse: 66  Resp: 12  O2 sat 96% at RA. Body mass index is 38.34 kg/m.   Physical Exam  Nursing note and vitals reviewed. Constitutional: She is oriented to person, place, and time. She appears well-developed. No distress.  HENT:  Head: Atraumatic.  Mouth/Throat: Oropharynx is clear and moist and mucous membranes are  normal.  Eyes: Conjunctivae and EOM are normal. Pupils are equal, round, and reactive to light.  Cardiovascular: Normal rate and regular rhythm.   No murmur heard. Pulses:      Dorsalis pedis pulses are 2+ on the right side, and 2+ on the left side.  Respiratory: Effort normal and breath sounds normal. No respiratory distress.  GI: Soft. She exhibits no mass. There is no hepatomegaly. There is no tenderness.  Musculoskeletal: She exhibits edema (1+ pitting edema LE, bilateral). She exhibits no tenderness.  Lymphadenopathy:    She has no cervical adenopathy.  Neurological: She is alert and oriented to person, place, and time. She has normal strength. Coordination normal.  Skin: Skin is warm. No rash noted. No erythema.  Psychiatric: She has a normal mood and affect.  Well groomed, good eye contact.      ASSESSMENT AND PLAN:   [name] was seen today for establish care.  Diagnoses and all orders for this visit:  Essential hypertension  Not well controlled. Possible complications of elevated BP discussed. Lisinopril 10 mg added today,some side effects discussed. No changes in rest of antihypertensive medications. Monitor BP at home. BMP in 3 days (will not have health insurance after 02/21/17). BP check in 6 weeks and f/u in 3-4 months.   -     Basic metabolic panel; Future -     lisinopril  (PRINIVIL,ZESTRIL) 10 MG tablet; Take 1 tablet (10 mg total) by mouth daily. -     hydrochlorothiazide (HYDRODIURIL) 25 MG tablet; Take 1 tablet (25 mg total) by mouth every morning.   Hyperlipidemia, mixed  Low fat diet recommended. Further recommendations will be given according to labs results. F/U 6-12 months.  -     Lipid panel; Future -     Basic metabolic panel; Future  Bilateral lower extremity edema  We discussed possible etiologies and treatment options. Most likely venous related. Compression stocking, which she mentions she has had in the past,custom made. Wt loss and low salt diet may also help.   -     hydrochlorothiazide (HYDRODIURIL) 25 MG tablet; Take 1 tablet (25 mg total) by mouth every morning. -     TSH; Future  BMI 38.0-38.9,adult  We discussed benefits of wt loss as well as adverse effects of obesity. Consistency with healthy diet and physical activity recommended. Weight Watchers is a good option as well as daily brisk walking for 15-30 min as tolerated.  -     TSH; Future  Rash and nonspecific skin eruption  Currently asymptomatic. No changes in current management. F/U in 12 months,before if needed.  -     Triamcinolone Acetonide (TRIAMCINOLONE 0.1 % CREAM : EUCERIN) CREA; Apply 1 application topically 2 (two) times daily as needed for rash or itching.       -[name] was advised to return sooner than planned today if new concerns arise.       [name] G. [name], MD  [facility] [place] [facility]. [place] office.

## 2017-02-18 ENCOUNTER — Ambulatory Visit (INDEPENDENT_AMBULATORY_CARE_PROVIDER_SITE_OTHER): Payer: Managed Care, Other (non HMO) | Admitting: Family Medicine

## 2017-02-18 ENCOUNTER — Encounter: Payer: Self-pay | Admitting: Family Medicine

## 2017-02-18 VITALS — BP 160/100 | HR 66 | Resp 12 | Ht 64.0 in | Wt 223.4 lb

## 2017-02-18 DIAGNOSIS — R21 Rash and other nonspecific skin eruption: Secondary | ICD-10-CM

## 2017-02-18 DIAGNOSIS — Z6838 Body mass index (BMI) 38.0-38.9, adult: Secondary | ICD-10-CM

## 2017-02-18 DIAGNOSIS — R600 Localized edema: Secondary | ICD-10-CM | POA: Diagnosis not present

## 2017-02-18 DIAGNOSIS — E782 Mixed hyperlipidemia: Secondary | ICD-10-CM | POA: Diagnosis not present

## 2017-02-18 DIAGNOSIS — I10 Essential (primary) hypertension: Secondary | ICD-10-CM

## 2017-02-18 MED ORDER — HYDROCHLOROTHIAZIDE 25 MG PO TABS: 25.0000 mg | ORAL_TABLET | Freq: Every morning | ORAL | 1 refills | Status: DC

## 2017-02-18 MED ORDER — TRIAMCINOLONE 0.1 % CREAM:EUCERIN CREAM 1:1: 1.0000 "application " | TOPICAL_CREAM | Freq: Two times a day (BID) | CUTANEOUS | 1 refills | Status: DC | PRN

## 2017-02-18 MED ORDER — LISINOPRIL 10 MG PO TABS: 10.0000 mg | ORAL_TABLET | Freq: Every day | ORAL | 1 refills | Status: DC

## 2017-02-18 NOTE — Progress Notes (Signed)
Pre visit review using our clinic review tool, if applicable. No additional management support is needed unless otherwise documented below in the visit note. 

## 2017-02-18 NOTE — Patient Instructions (Signed)
A few things to remember from today's visit:   Essential hypertension - Plan: Basic metabolic panel, lisinopril (PRINIVIL,ZESTRIL) 10 MG tablet  Morbid obesity (HCC) - Plan: Basic metabolic panel  Hyperlipidemia, mixed - Plan: Lipid panel, Basic metabolic panel  Blood pressure goal for most people is less than 140/90. Some populations (older than 60) the goal is less than 150/90.  Most recent cardiologists' recommendations recommend blood pressure at or less than 130/80.   Elevated blood pressure increases the risk of strokes, heart and kidney disease, and eye problems. Regular physical activity and a healthy diet (DASH diet) usually help. Low salt diet. Take medications as instructed.  Caution with some over the counter medications as cold medications, dietary products (for weight loss), and Ibuprofen or Aleve (frequent use);all these medications could cause elevation of blood pressure.   Please be sure medication list is accurate. If a new problem present, please set up appointment sooner than planned today.

## 2017-02-21 ENCOUNTER — Other Ambulatory Visit (INDEPENDENT_AMBULATORY_CARE_PROVIDER_SITE_OTHER): Payer: Managed Care, Other (non HMO)

## 2017-02-21 DIAGNOSIS — E782 Mixed hyperlipidemia: Secondary | ICD-10-CM | POA: Diagnosis not present

## 2017-02-21 DIAGNOSIS — R600 Localized edema: Secondary | ICD-10-CM

## 2017-02-21 DIAGNOSIS — I10 Essential (primary) hypertension: Secondary | ICD-10-CM | POA: Diagnosis not present

## 2017-02-21 DIAGNOSIS — R7989 Other specified abnormal findings of blood chemistry: Secondary | ICD-10-CM

## 2017-02-21 DIAGNOSIS — Z6838 Body mass index (BMI) 38.0-38.9, adult: Secondary | ICD-10-CM

## 2017-02-21 LAB — TSH: TSH: 3.63 u[IU]/mL (ref 0.35–4.50)

## 2017-02-21 LAB — LIPID PANEL
CHOL/HDL RATIO: 8
Cholesterol: 255 mg/dL — ABNORMAL HIGH (ref 0–200)
HDL: 33.2 mg/dL — AB (ref >39.00)
NonHDL: 221.35
Triglycerides: 286 mg/dL — ABNORMAL HIGH (ref 0.0–149.0)
VLDL: 57.2 mg/dL — AB (ref 0.0–40.0)

## 2017-02-21 LAB — BASIC METABOLIC PANEL
BUN: 18 mg/dL (ref 6–23)
CALCIUM: 9.3 mg/dL (ref 8.4–10.5)
CO2: 29 mEq/L (ref 19–32)
Chloride: 102 mEq/L (ref 96–112)
Creatinine, Ser: 0.74 mg/dL (ref 0.40–1.20)
GFR: 103.59 mL/min (ref >60.00)
GLUCOSE: 108 mg/dL — AB (ref 70–99)
Potassium: 4 mEq/L (ref 3.5–5.1)
Sodium: 140 mEq/L (ref 135–145)

## 2017-02-21 LAB — LDL CHOLESTEROL, DIRECT: Direct LDL: 145 mg/dL

## 2017-02-25 ENCOUNTER — Other Ambulatory Visit: Payer: Self-pay

## 2017-02-25 MED ORDER — SIMVASTATIN 20 MG PO TABS: ORAL_TABLET | ORAL | 1 refills | Status: DC

## 2017-03-17 ENCOUNTER — Ambulatory Visit (INDEPENDENT_AMBULATORY_CARE_PROVIDER_SITE_OTHER): Payer: Self-pay | Admitting: Pulmonary Disease

## 2017-03-17 ENCOUNTER — Encounter: Payer: Self-pay | Admitting: Pulmonary Disease

## 2017-03-17 DIAGNOSIS — R05 Cough: Secondary | ICD-10-CM

## 2017-03-17 DIAGNOSIS — R059 Cough, unspecified: Secondary | ICD-10-CM

## 2017-03-17 DIAGNOSIS — R058 Other specified cough: Secondary | ICD-10-CM | POA: Insufficient documentation

## 2017-03-17 MED ORDER — PREDNISONE 10 MG PO TABS: ORAL_TABLET | ORAL | 0 refills | Status: DC

## 2017-03-17 NOTE — Progress Notes (Signed)
Subjective:    Patient ID: [name], female    DOB: Apr 21, 1956, 61 y.o.   MRN: [id number]  HPI  Patient is being seen by Dr. Melvyn Novas. ? h/o sarcoidosis. Recent dyspnea, cough, congestion. She is here today urgently for persistence of symptoms.  Pt states she has sarcoidosis. Pt's cough started 2 months  ago. She called the office and was given zpak.  Cough got better but recurred 1 month ago. She coughs daily. (-) fevers, chills. Has sinus congestion and drip. On and off wheezing.  (-) cough at night. Recent SOB. Used alb yesterday. (-) sick contacts.   (-) smoking.   Has GERD which is stable. On PPI.   Review of Systems  Constitutional: Negative.   HENT: Negative.   Eyes: Negative.   Respiratory: Positive for cough.   Cardiovascular: Negative.   Gastrointestinal: Negative.   Endocrine: Negative.   Genitourinary: Negative.   Musculoskeletal: Negative.   Skin: Negative.   Allergic/Immunologic: Negative.   Neurological: Negative.   Hematological: Negative.   Psychiatric/Behavioral: Negative.        Objective:   Physical Exam   Vitals:  Vitals:   03/17/17 1530  BP: 136/82  Pulse: 74  SpO2: 95%  Weight: 223 lb 6.4 oz (101.3 kg)  Height: 5\' 4"  (1.626 m)    Constitutional/General:  Pleasant, well-nourished, well-developed, not in any distress,  Comfortably seating.  Well kempt  Body mass index is 38.35 kg/m. Wt Readings from Last 3 Encounters:  03/17/17 223 lb 6.4 oz (101.3 kg)  02/18/17 223 lb 6 oz (101.3 kg)  11/28/16 216 lb (98 kg)     HEENT: Pupils equal and reactive to light and accommodation. Anicteric sclerae. Normal nasal mucosa.   No oral  lesions,  mouth clear,  oropharynx clear, no postnasal drip. (-) Oral thrush. No dental caries.  Airway - Mallampati class III  Neck: No masses. Midline trachea. No JVD, (-) LAD. (-) bruits appreciated.  Respiratory/Chest: Grossly normal chest. (-) deformity. (-) Accessory muscle use.  Symmetric  expansion. (-) Tenderness on palpation.  Resonant on percussion.  Diminished BS on both lower lung zones. (-) wheezing, crackles, rhonchi (-) egophony  Cardiovascular: Regular rate and  rhythm, heart sounds normal, no murmur or gallops, no peripheral edema  Gastrointestinal:  Normal bowel sounds. Soft, non-tender. No hepatosplenomegaly.  (-) masses.   Musculoskeletal:  Normal muscle tone. Normal gait.   Extremities: Grossly normal. (-) clubbing, cyanosis.  (-) edema  Skin: (-) rash,lesions seen.   Neurological/Psychiatric : alert, oriented to time, place, person. Normal mood and affect         Assessment & Plan:  Cough Patient with ? Sarcoidosis. Patient states she has sarcoidosis. Recent cough, dyspnea, congestion 2 months ago. Got Z-Pak which made her symptoms better but recurred a month ago. Coughs at night as well.  Could be sarcoidosis flareup. Could be post viral bronchitis/hypersensitivity.    Plan to try her on prednisone 20 mg a day for 1 week followed by 10 mg a day for 1 week. If she is not better after 2 weeks, she may need a longer prednisone taper for sarcoidosis flareup.  I will hold off on chest x-ray unless she is not better in 2 weeks. If she continues to be symptomatic in 2 weeks, plan for chest x-ray.  Also, I discussed with her regarding acid reflux. Suggested maybe try AcipHex at bedtime. Aspiration precautions. Head of bed 30.  Return to clinic with Dr. Melvyn Novas as scheduled.  Monica Becton, MD 03/17/2017, 4:07 PM  Pulmonary and Critical Care Pager [phone] After 3 pm or if no answer, call [phone]

## 2017-03-17 NOTE — Assessment & Plan Note (Signed)
Patient with ? Sarcoidosis. Patient states she has sarcoidosis. Recent cough, dyspnea, congestion 2 months ago. Got Z-Pak which made her symptoms better but recurred a month ago. Coughs at night as well.  Could be sarcoidosis flareup. Could be post viral bronchitis/hypersensitivity.    Plan to try her on prednisone 20 mg a day for 1 week followed by 10 mg a day for 1 week. If she is not better after 2 weeks, she may need a longer prednisone taper for sarcoidosis flareup.  I will hold off on chest x-ray unless she is not better in 2 weeks. If she continues to be symptomatic in 2 weeks, plan for chest x-ray.  Also, I discussed with her regarding acid reflux. Suggested maybe try AcipHex at bedtime. Aspiration precautions. Head of bed 30.

## 2017-03-17 NOTE — Patient Instructions (Signed)
It was a pleasure taking care of you today!  You are diagnosed with bronchitis.  We will start you on prednisone 10 mg/tab, 2 tabs/day for 1 week then 1 tablet a day for 1 week.  Call us back in 2 weeks if you are not better. You may need a longer prednisone taper for your sarcoidosis.  Follow up with Dr. Melvyn Novas as scheduled.

## 2017-04-21 ENCOUNTER — Telehealth: Payer: Self-pay | Admitting: Family Medicine

## 2017-04-21 ENCOUNTER — Ambulatory Visit (INDEPENDENT_AMBULATORY_CARE_PROVIDER_SITE_OTHER)
Admission: RE | Admit: 2017-04-21 | Discharge: 2017-04-21 | Disposition: A | Discharge status: Home / Self Care | Payer: Self-pay | Source: Ambulatory Visit | Attending: Acute Care | Admitting: Acute Care

## 2017-04-21 ENCOUNTER — Encounter: Payer: Self-pay | Admitting: Acute Care

## 2017-04-21 ENCOUNTER — Ambulatory Visit (INDEPENDENT_AMBULATORY_CARE_PROVIDER_SITE_OTHER): Payer: Self-pay | Admitting: Acute Care

## 2017-04-21 VITALS — BP 154/96 | HR 85 | Ht 64.0 in | Wt 221.0 lb

## 2017-04-21 DIAGNOSIS — R05 Cough: Secondary | ICD-10-CM

## 2017-04-21 DIAGNOSIS — D869 Sarcoidosis, unspecified: Secondary | ICD-10-CM

## 2017-04-21 DIAGNOSIS — R059 Cough, unspecified: Secondary | ICD-10-CM

## 2017-04-21 DIAGNOSIS — J069 Acute upper respiratory infection, unspecified: Secondary | ICD-10-CM

## 2017-04-21 MED ORDER — PREDNISONE 10 MG PO TABS: ORAL_TABLET | ORAL | 0 refills | Status: DC

## 2017-04-21 MED ORDER — FLUTICASONE PROPIONATE 50 MCG/ACT NA SUSP: 2.0000 | Freq: Every day | NASAL | 2 refills | Status: DC

## 2017-04-21 NOTE — Telephone Encounter (Signed)
Pt would like to know what type of decongestant she can take along with the BP med she is on.

## 2017-04-21 NOTE — Progress Notes (Signed)
History of Present Illness [name] is a 61 y.o. female former smoker ( [place]) with Sarcoid and upper airway cough syndrome. She is followed by Dr. Melvyn Novas.  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  Last chronic prednisone 04/15/08 with no flare cough no nodules (right leg and left hand and right arm)   04/21/2017 Acute OV: Pt. Presents for follow up of cough and shortness of breath. ( Cough started about 2 weeks ago after her daughter brought a dog into her home.)She was seen by Dr. Corrie Dandy 03/17/2017. He treated her with a prednisone taper at that time for either sarcoidosis or viral bronchitis/ hypersensitivity.  She has completed her taper.. She states she got better after the prednisone taper. She returns today with continued cough and shortness of breath. She states that this episode of shortness of breath and wheezing started 2 -3 days ago.She feels this is due to exposure to the family dog. She states prior to this she was fine.She is very congested nasally, but denies any sinus pain.. She is coughing, but it is non-productive.She is not using any flonase, nasal saline or decongestants..She states her nasal secretions are thick and yellow. She states she has had chills, but no fever. She denies chest pain, orthopnea or hemoptysis.She states she is taking her Aciphex and her pepcid AC on a regular basis, and is compliant with her GERD regimen.  Test Results:  CBC Latest Ref Rng & Units 11/28/2016 06/27/2015 05/31/2014  WBC 4.0 - 10.5 K/uL - 7.2 7.6  Hemoglobin 12.0 - 15.0 g/dL 11.9(L) 12.7 12.0  Hematocrit 36.0 - 46.0 % 35.0(L) 36.3 34.8(L)  Platelets  150.0 - 400.0 K/uL - 222.0 220.0    BMP Latest Ref Rng & Units 02/21/2017 11/28/2016 06/27/2015  Glucose 70 - 99 mg/dL 108(H) 98 101(H)  BUN 6 - 23 mg/dL 18 17 16   Creatinine 0.40 - 1.20 mg/dL 0.74 0.70 0.73  Sodium 135 - 145 mEq/L 140 141 137  Potassium 3.5 - 5.1 mEq/L 4.0 3.8 4.1  Chloride 96 - 112 mEq/L 102 102 101  CO2 19 - 32 mEq/L 29 - 29  Calcium 8.4 - 10.5 mg/dL 9.3 - 9.8    PFT    Component Value Date/Time   FEV1PRE 1.37 08/22/2016 1120   FEV1POST 1.45 08/22/2016 1120   FVCPRE 1.70 08/22/2016 1120   FVCPOST 1.77 08/22/2016 1120   TLC 3.28 08/22/2016 1120   DLCOUNC 14.95 08/22/2016 1120   PREFEV1FVCRT 80 08/22/2016 1120   PSTFEV1FVCRT 82 08/22/2016 1120      Past medical hx Past Medical History:  Diagnosis Date  . Bifid uvula   . GERD (gastroesophageal reflux disease)   . History of cervical dysplasia   . History of chemotherapy    06/ 2005  . History of cleft lip    CORRECTED  . History of condyloma acuminatum    PERIANAL  . History of radiation therapy    completed 06/ 2005  . 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 History of vulvar dysplasia   . Hypertension   . 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  Pulmonary nodule, left   . 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 Sarcoid uveitis of left eye goes to [facility]--  currently no issues per pt 11-27-2016  . Upper airway cough syndrome   . Wears glasses      Social History  Substance Use Topics  . Smoking status: Former Smoker    Packs/day: 0.50    Years: 1.00    Types: Cigarettes    Quit date: 12/16/1993  . Smokeless tobacco: Never Used  . Alcohol use Yes     Comment: occasional    Tobacco Cessation: Former smoker who quit 1995  Past surgical hx, Family hx, Social hx all reviewed.  Current Outpatient Prescriptions on File Prior to Visit    Medication Sig  . albuterol (PROVENTIL HFA;VENTOLIN HFA) 108 (90 Base) MCG/ACT inhaler Inhale 2 puffs into the lungs every 4 (four) hours as needed for wheezing or shortness of breath.  . bisoprolol (ZEBETA) 10 MG tablet Take 1 tablet (10 mg total) by mouth daily. (Patient taking differently: Take 10 mg by mouth daily with breakfast. )  . famotidine (PEPCID) 20 MG tablet Take 20 mg by mouth at bedtime.  . hydrochlorothiazide (HYDRODIURIL) 25 MG tablet Take 1 tablet (25 mg total) by mouth every morning.  Marland Kitchen Hydrocodone-Acetaminophen (VICODIN) 5-300 MG TABS Take 1 tablet by mouth 3 (three) times daily as needed (PAIN).  . Lidocaine, Anorectal, 5 % CREA Apply 1 application topically 2 (two) times daily.  Marland Kitchen lisinopril (PRINIVIL,ZESTRIL) 10 MG tablet Take 1 tablet (10 mg total) by mouth daily.  . methocarbamol (ROBAXIN) 500 MG tablet Take 1 tablet (500 mg total) by mouth 4 (four) times daily.  . mometasone (ELOCON) 0.1 % cream Apply 1 application topically as needed.   . RABEprazole (ACIPHEX) 20 MG tablet take 1 tablet by mouth once daily 30 MIN BEFORE EATING (Patient taking differently: Take 20 mg by mouth every morning. take 1 tablet by mouth once daily 30 MIN BEFORE EATING)  . simvastatin (ZOCOR) 20 MG tablet Take 1 tablet by mouth daily with supper.  . Triamcinolone Acetonide (TRIAMCINOLONE 0.1 % CREAM : EUCERIN) CREA Apply 1 application topically 2 (two) times daily as needed for rash or itching.   No current facility-administered medications on file prior to visit.      Allergies  Allergen Reactions  . Shellfish Allergy Itching and Swelling  . Penicillins Hives and Itching    Review Of Systems:  Constitutional:   No  weight loss, night sweats,  Fevers, + chills,+  fatigue, or  lassitude.  HEENT:   No headaches,  Difficulty swallowing,  Tooth/dental problems, or  Sore throat,                No sneezing, itching, ear ache,+ nasal congestion, +post nasal drip,   CV:  No chest pain,   Orthopnea, PND, swelling in lower extremities, anasarca, dizziness, palpitations, syncope.   GI  No heartburn, indigestion, abdominal pain, nausea, vomiting, diarrhea, change in bowel habits, loss of appetite, bloody stools.   Resp: No shortness of breath with exertion or at rest.  + excess mucus, no productive cough,  + non-productive cough,  No coughing up of blood.  + change in color of mucus.  + wheezing.  No chest wall deformity  Skin: no rash or lesions.  GU: no dysuria, change in color of urine, no urgency or frequency.  No flank pain, no hematuria  MS:  No joint pain or swelling.  No decreased range of motion.  No back pain.  Psych:  No change in mood or affect. No depression or anxiety.  No memory loss.   Vital Signs BP (!) 154/96 (BP Location: Left Arm, Cuff Size: Normal)   Pulse 85   Ht 5\' 4"  (1.626 m)   Wt 221 lb (100.2 kg)   SpO2 95%   BMI 37.93 kg/m    Physical Exam:  General- No distress,  A&Ox3, appears tired ENT: No sinus tenderness, TM clear, edematous nasal mucosa, no oral exudate,+ post nasal drip, no LAN Cardiac: S1, S2, regular rate and rhythm, no murmur Chest: No wheeze/ rales/ dullness; no accessory muscle use, no nasal flaring, no sternal retractions Abd.: Soft Non-tender, obese Ext: No clubbing cyanosis, edema Neuro:  normal strength Skin: No rashes, warm and dry Psych: normal mood and behavior   Assessment/Plan  URI (upper respiratory infection) Acute upper respiratory infection/hypersensitivity to pet dander Plan We will treat you with a prednisone taper. Prednisone taper; 10 mg tablets: 4 tabs x 2 days, 3 tabs x 2 days, 2 tabs x 2 days 1 tab x 2 days then stop. CXR today. We will call you with results. Flonase nasal spray 2 squirts up each nare once daily. Continue using your Flutter valve 4 times daily. Use nasal saline to help with nasal congestion. Delsym cough syrup every 12 hours. Add a decongestant .Check with your PCP to see   which one they recommend with high blood pressure. Continue Claritin daily Follow up appointment in 2 weeks with Eric Form, NP Please contact office for sooner follow up if symptoms do not improve or worsen or seek emergency care      Magdalen Spatz, NP 04/21/2017  1:46 PM

## 2017-04-21 NOTE — Telephone Encounter (Signed)
Please advise 

## 2017-04-21 NOTE — Telephone Encounter (Signed)
I do not recommend taking decongestants or cold medications because they can increase BP. Usually OTC antihistaminics (Zyrtec 10 mg or Allegra 180 mg) and intranasal nasal spray helps with nasal congestion. Also nasal irrigations with saline.  Thanks, BJ

## 2017-04-21 NOTE — Assessment & Plan Note (Signed)
Acute upper respiratory infection/hypersensitivity to pet dander Plan We will treat you with a prednisone taper. Prednisone taper; 10 mg tablets: 4 tabs x 2 days, 3 tabs x 2 days, 2 tabs x 2 days 1 tab x 2 days then stop. CXR today. We will call you with results. Flonase nasal spray 2 squirts up each nare once daily. Continue using your Flutter valve 4 times daily. Use nasal saline to help with nasal congestion. Delsym cough syrup every 12 hours. Add a decongestant .Check with your PCP to see  which one they recommend with high blood pressure. Continue Claritin daily Follow up appointment in 2 weeks with Eric Form, NP Please contact office for sooner follow up if symptoms do not improve or worsen or seek emergency care

## 2017-04-21 NOTE — Patient Instructions (Addendum)
It is nice to meet you today. We will treat you with a prednisone taper. Prednisone taper; 10 mg tablets: 4 tabs x 2 days, 3 tabs x 2 days, 2 tabs x 2 days 1 tab x 2 days then stop. CXR today. We will call you with results. Flonase nasal spray 2 squirts up each nare once daily. Continue using your Flutter valve 4 times daily. Use nasal saline to help with nasal congestion. Delsym cough syrup every 12 hours. Add a decongestant .Check with your PCP to see  which one they recommend with high blood pressure. Continue Claritin daily Follow up appointment in 2 weeks with Veronica Form, NP Please contact office for sooner follow up if symptoms do not improve or worsen or seek emergency care

## 2017-04-21 NOTE — Progress Notes (Signed)
Chart and office note reviewed in detail  > agree with a/p as outlined    

## 2017-04-22 NOTE — Progress Notes (Signed)
Informed patient of SG results and recommendations. Pt verbalized understanding and did not have any questions. Nothing further is needed.

## 2017-04-22 NOTE — Telephone Encounter (Signed)
Called and spoke with patient. Went over message below with patient and advised her that there are some cold medications that can be taking with blood pressure issues, but to talk with the pharmacist before she purchases anything. Patient verbalized understanding.

## 2017-05-05 ENCOUNTER — Encounter: Payer: Self-pay | Admitting: Acute Care

## 2017-05-05 ENCOUNTER — Ambulatory Visit (INDEPENDENT_AMBULATORY_CARE_PROVIDER_SITE_OTHER): Payer: Self-pay | Admitting: Acute Care

## 2017-05-05 DIAGNOSIS — R05 Cough: Secondary | ICD-10-CM

## 2017-05-05 DIAGNOSIS — R059 Cough, unspecified: Secondary | ICD-10-CM

## 2017-05-05 MED ORDER — PREDNISONE 10 MG PO TABS: ORAL_TABLET | ORAL | 0 refills | Status: DC

## 2017-05-05 MED ORDER — BUDESONIDE-FORMOTEROL FUMARATE 160-4.5 MCG/ACT IN AERO: 2.0000 | INHALATION_SPRAY | Freq: Two times a day (BID) | RESPIRATORY_TRACT | 0 refills | Status: DC

## 2017-05-05 MED ORDER — BUDESONIDE-FORMOTEROL FUMARATE 160-4.5 MCG/ACT IN AERO: 2.0000 | INHALATION_SPRAY | Freq: Two times a day (BID) | RESPIRATORY_TRACT | 12 refills | Status: DC

## 2017-05-05 NOTE — Patient Instructions (Addendum)
It is good to see you again. We will start Symbicort 2 puffs twice daily without fail.( Therapeutic Trial) This is your maintenance medication.  Remember to rinse after use. Also, using a baking soda based toothpaste to decrease risk of developing oral thrush. Continue using your Pro Air inhaler for breakthrough shortness of breath. Prednisone taper; 10 mg tablets: 3 tabs x 2 days, 2 tabs x 2 days 1 tab x 2 days then stop. Call the office tomorrow and request a copy of todays visit note. [phone]) Consider allergy testing at follow up Consider HRCT if no better to evaluate for progressive sarcoidosis. Follow up in 2 months with Dr. Melvyn Novas Please contact office for sooner follow up if symptoms do not improve or worsen or seek emergency care
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
## 2017-05-05 NOTE — Assessment & Plan Note (Signed)
Continued cough and dyspnea with exertion . Improves with prednisone Plan: We will start Symbicort 2 puffs twice daily without fail.( Therapeutic Trial) This is your maintenance medication.  Remember to rinse after each use. Also, using a baking soda based toothpaste to decrease risk of developing oral thrush. Continue using your Pro Air inhaler for breakthrough shortness of breath. Prednisone taper; 10 mg tablets: 3 tabs x 2 days, 2 tabs x 2 days 1 tab x 2 days then stop. Call the office tomorrow and request a copy of todays visit note. [phone]) Consider allergy testing at follow up Consider HRCT if no better to evaluate for progressive sarcoidosis. Follow up in 2 months with Dr. Melvyn Novas Please contact office for sooner follow up if symptoms do not improve or worsen or seek emergency care

## 2017-05-06 ENCOUNTER — Telehealth: Payer: Self-pay | Admitting: *Deleted

## 2017-05-06 NOTE — Telephone Encounter (Signed)
-----   Message from Tanda Rockers, MD sent at 05/06/2017 11:01 AM EDT ----- Yes if she's not all better - must bring all meds though ----- Message ----- From: Rosana Berger, CMA Sent: 05/06/2017  10:46 AM To: Tanda Rockers, MD  You have an opening tomorrow, but she was just seen by SG yesterday is that OK? ----- Message ----- From: Tanda Rockers, MD Sent: 05/05/2017   3:50 PM To: Rosana Berger, CMA  Move her f/u to first avail with all active meds in hand

## 2017-05-06 NOTE — Telephone Encounter (Signed)
Spoke with the pt  She states not improved yet  OV with MW at 4 pm tomorrow  I advised needs to bring all meds  She verbalized understanding of this

## 2017-05-07 ENCOUNTER — Encounter: Payer: Self-pay | Admitting: Internal Medicine

## 2017-05-07 ENCOUNTER — Ambulatory Visit (INDEPENDENT_AMBULATORY_CARE_PROVIDER_SITE_OTHER): Payer: Self-pay | Admitting: Internal Medicine

## 2017-05-07 VITALS — BP 188/110 | HR 77 | Ht 64.0 in | Wt 222.6 lb

## 2017-05-07 DIAGNOSIS — R058 Other specified cough: Secondary | ICD-10-CM

## 2017-05-07 DIAGNOSIS — I10 Essential (primary) hypertension: Secondary | ICD-10-CM

## 2017-05-07 DIAGNOSIS — D869 Sarcoidosis, unspecified: Secondary | ICD-10-CM

## 2017-05-07 DIAGNOSIS — R05 Cough: Secondary | ICD-10-CM

## 2017-05-07 MED ORDER — AMLODIPINE BESYLATE 10 MG PO TABS: 10.0000 mg | ORAL_TABLET | Freq: Every day | ORAL | 11 refills | Status: DC

## 2017-05-07 MED ORDER — VALSARTAN 320 MG PO TABS: 320.0000 mg | ORAL_TABLET | Freq: Every day | ORAL | 11 refills | Status: DC

## 2017-05-07 MED ORDER — TRAMADOL HCL 50 MG PO TABS: ORAL_TABLET | ORAL | 0 refills | Status: DC

## 2017-05-07 NOTE — Patient Instructions (Addendum)
Stop lotensin and start valasartan 325 mg one daily   Start amlodipine 10 mg daily    Work on inhaler technique:  relax and gently blow all the way out then take a nice smooth deep breath back in, triggering the inhaler at same time you start breathing in.  Hold for up to 5 seconds if you can. Blow out thru nose. Rinse and gargle with water when done      Only use your albuterol as a rescue medication to be used if you can't catch your breath by resting or doing a relaxed purse lip breathing pattern.  - The less you use it, the better it will work when you need it. - Ok to use up to 2 puffs  every 4 hours if you must but call for immediate appointment if use goes up over your usual need - Don't leave home without it !!  (think of it like the spare tire for your car)   For cough use the flutter valve and Take delsym two tsp every 12 hours and supplement if needed with  tramadol 50 mg up to 2 every 4 hours to suppress the urge to cough. Swallowing water or using ice chips/non mint and menthol containing candies (such as lifesavers or sugarless jolly ranchers) are also effective.  You should rest your voice and avoid activities that you know make you cough.  Once you have eliminated the cough for 3 straight days try reducing the tramadol first,  then the delsym as tolerated.    Finish your prednisone  Follow the med calendar daily and bring it back with you to all office visits   See [name] w/in 2 weeks with all your medications, even over the counter meds, separated in two separate bags, the ones you take no matter what vs the ones you stop once you feel better and take only as needed when you feel you need them.   [name]  will generate for you a new user friendly medication calendar that will put [place] all on the same page re: your medication use.     Without this process, it simply isn't possible to assure that we are providing  your outpatient care  with  the attention to detail we feel you  deserve.   If we cannot assure that you're getting that kind of care,  then we cannot manage your problem effectively from this clinic.  Once you have seen [name] and we are sure that we're all on the same page with your medication use she will arrange follow up with me.

## 2017-05-07 NOTE — Assessment & Plan Note (Signed)
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  - Chronic pred rx 2/09> 04/2008  - Restart Pred December 26, 2008 > June 12, 2009 stopped  > restarted 07/11/11>>pt stopped 10/15/11  - PFT's March 23, 2010 VC 1.77 (66%) no obst, ERV 34%, DLCO 53 > 115% corrected - PFT's 09/30/12       VC 2.02 (57%) ratio 78 , erv 13%  DLCO 55 > 118% corrected - Skin bx 05/19/14 bridge of nose > Pos NCG - 05/02/2015  Walked RA x 3 laps @ 185 ft each stopped due to end of study, no sob/ no desat at nl pace/ no cough provoked  - PFT's  08/22/2016 VC  1.81 ratio 82  with DLCO  59/55 % corrects to 88 % for alv volume  - Spirometry 05/07/2017   FVC  1.81  With no obst by fev1/fvc and no significant curvature  - 05/07/2017  After extensive coaching HFA effectiveness =    75% from a baseline of < 25%   Although I suppose it's possible she has airway involvement now from sarcoid, I really think most of her symptoms are upper airway and unrelated so leave off symb/ finish pred and then recheck in 2 weeks

## 2017-05-07 NOTE — Assessment & Plan Note (Signed)
Body mass index is 38.21 kg/m.  -  trending up  Lab Results  Component Value Date   TSH 3.63 02/21/2017     Contributing to gerd risk/ doe/reviewed the need and the process to achieve and maintain neg calorie balance > defer f/u primary care including intermittently monitoring thyroid status

## 2017-05-07 NOTE — Progress Notes (Signed)
Subjective:    Patient ID: [name], female    DOB: 04-04-1956  MRN: [id number]    Brief patient profile:  89 yobf minimal smoking hx admitted from 01/20/2008 through 01/27/2008 for evaluation of an FUO with cough and parenchymal pulmonary infiltrates which by transbronchial biopsy dated 01/22/2008 represented noncaseating granulomatous inflammation. 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  Last chronic prednisone 04/15/08 with no flare cough no nodules (right leg and left hand and right arm)   History of Present Illness  Last ov 02/17/13 no evidence active sarcoid rec Aciphex 20 mg Take 30-60 min before first meal of the day also when coughing for any reason take Pepcid 20 mg one at bedtime  Take delsym two tsp every 12 hours and supplement if needed with  tramadol 50 mg up to 2 every 4 hours to suppress the urge to cough. Swallowing water or using ice chips/non mint and menthol containing candies (such as lifesavers or sugarless jolly ranchers) are also effective.  You should rest your voice and avoid activities that you know make you cough. Once you have eliminated the cough for 3 straight days try reducing the tramadol first,  then the delsym as tolerated.   GERD diet    01/28/2014  Pulmonary consultation / [name]/ on no maint resp rx / rare baseline need for saba  Chief Complaint  Patient presents with  . Pulmonary Consult    Referred per Dr. Posey Pronto. [name] Cough is prod with moderate yellow sputum. She is using proair approx tid.  Onset was Jan 01 2014 while on aciphex with bfast with flu like illness but not follow contingency rx  On prednisone taper/ sp tamiflu/cough meds  Not limited by breathing from desired activities   rec Aciphex 20 mg Take 30-60 min before first meal of the day also when coughing for any reason take  Pepcid 20 mg one at bedtime and chlortrimeton 4 mg at bedtime Take delsym two tsp every 12 hours and supplement if needed with  tramadol 50 mg up to 2 every 4 hours to suppress the urge to cough.   Once you have eliminated the cough for 3 straight days try reducing the tramadol first,  then the delsym as tolerated.   GERD diet  F/u prn    08/22/2016  f/u ov/[name] re: cough mostly daytime non prod x May 2017 / not taking gerd rx as rec  Chief Complaint  Patient presents with  . Follow-up    Cough is unchanged. No new co's today.  Not limited by breathing from desired activities walks a mile 3 x weekly s stopping  rec Try not to clear your throat  - keep sugarless candy handy but no mint menthol or chocolate  Resume gerd rx > resolved    05/07/2017 acute extended ov/[name] re: cough > sob  Chief Complaint  Patient presents with  . Acute Visit    Cough not improving, non prod "feels like something in my throat". She is waking up in the night coughing.   was doing fine for months since last ov with me but not doing well as well since 02/18/17 when placed on lisinopril and gradually  downhill since on multple courses of prednisone and now symbicort (though very poor hfa) and much worse dry cough 24/7 no longer  has med calendar and doesn't even recognize the copy we have / very confused with meds   No obvious day to day or daytime variability or assoc excess/ purulent sputum or mucus plugs or hemoptysis or cp or chest tightness, subjective wheeze or overt sinus or hb symptoms. No unusual exp hx or h/o childhood pna/ asthma or knowledge of premature birth.   Also denies any obvious fluctuation of symptoms with weather or environmental changes or other aggravating or alleviating factors except as outlined above   Current Medications, Allergies, Complete Past Medical History, Past Surgical History, Family History, and Social History were reviewed in [employer] [facility] record.  ROS  The  following are not active complaints unless bolded sore throat, dysphagia, dental problems, itching, sneezing,  nasal congestion or excess/ purulent secretions, ear ache,   fever, chills, sweats, unintended wt loss, classically pleuritic or exertional cp,  orthopnea pnd or leg swelling, presyncope, palpitations, abdominal pain, anorexia, nausea, vomiting, diarrhea  or change in bowel or bladder habits, change in stools or urine, dysuria,hematuria,  rash, arthralgias, visual complaints, headache, numbness, weakness or ataxia or problems with walking or coordination,  change in mood/affect or memory.              Objective:   Physical Exam    amb obese  bf nad  - freq throat clearing   05/07/2017       222  08/22/2016         216   05/17/2016        215   05/02/15 219 lb (99.338 kg)  04/07/15 219 lb 9.6 oz (99.61 kg)  03/24/15 216 lb (97.977 kg)    Vital signs reviewed - Note on arrival 02 sats  97% on RA and bp 188/110      HEENT: nl dentition, turbinates, and orophanx. Nl external ear canals without cough reflex   NECK :  without JVD/Nodes/TM/ nl carotid upstrokes bilaterally   LUNGS: no acc muscle use,  Minimal bilateral insp and exp rhonchi, mostly pseudowheezes  CV:  RRR  no s3 or murmur or increase in P2, no edema   ABD:  soft and nontender with nl excursion in the supine position. No bruits or organomegaly, bowel sounds nl  MS:  warm without deformities, calf tenderness, cyanosis or clubbing  SKIN: warm and dry without lesions  - minimal sarcoid changes at junction of nose and   upper lip  NEURO:  alert, approp, no deficits    I personally reviewed images and agree with radiology impression as follows:  CXR:   04/21/17 Stable exam. Hilar fullness with interstitial coarsening and basilar atelectasis. Imaging features likely sarcoidosis related.           Assessment & Plan:

## 2017-05-07 NOTE — Assessment & Plan Note (Addendum)
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/02/2015 >>> - flare May 2017 p stopped aciphex > rec restart 05/17/2016  - flare since 02/2017 placed on lisinopril with no airflow obst 05/07/2017 by spirometry > d/c ACEi    Upper airway cough syndrome (previously labeled PNDS) , is  so named because it's frequently impossible to sort out how much is  CR/sinusitis with freq throat clearing (which can be related to primary GERD)   vs  causing  secondary (" extra esophageal")  GERD from wide swings in gastric pressure that occur with throat clearing, often  promoting self use of mint and menthol lozenges that reduce the lower esophageal sphincter tone and exacerbate the problem further in a cyclical fashion.   These are the same pts (now being labeled as having "irritable larynx syndrome" by some cough centers) who not infrequently have a history of having failed to tolerate ace inhibitors,  dry powder inhalers or biphosphonates or report having atypical/extraesophageal reflux symptoms that don't respond to standard doses of PPI  and are easily confused as having aecopd or asthma flares by even experienced allergists/ pulmonologists (myself included).    rec off acei, max rx for gerd and recheck in 2 weeks   I had an extended discussion with the patient reviewing all relevant studies completed to date and  lasting 25 minutes of a 40  minute acute visit to re-establish re  severe non-specific but potentially very serious refractory respiratory symptoms of uncertain and potentially multiple  etiologies.   Each maintenance medication was reviewed in detail including most importantly the difference between maintenance and as needed and under what circumstances the prns are to be used. This was done in the context of a medication calendar review which provided the patient with a user-friendly unambiguous mechanism for medication administration and reconciliation and provides an action plan for all active problems. It is critical that this be  shown to every doctor  for modification during the office visit if necessary so the patient can use it as a working document.

## 2017-05-07 NOTE — Assessment & Plan Note (Signed)
In the best review of chronic cough to date ( NEJM 2016 375 [phone]) ,  ACEi are now felt to cause cough in up to  20% of pts which is a 4 fold increase from previous reports and does not include the variety of non-specific complaints we see in pulmonary clinic in pts on ACEi but previously attributed to another dx like  Copd/asthma and  include PNDS, throat and chest congestion, "bronchitis", unexplained dyspnea and noct "strangling" sensations, and hoarseness, but also  atypical /refractory GERD symptoms like dysphagia and "bad heartburn"   The only way I know  to prove this is not an "ACEi Case" is a trial off ACEi x a minimum of 6 weeks then regroup.   Try valsartan 320 mg daily see avs for instructions unique to this ov

## 2017-05-22 ENCOUNTER — Ambulatory Visit (INDEPENDENT_AMBULATORY_CARE_PROVIDER_SITE_OTHER): Payer: Self-pay | Admitting: Internal Medicine

## 2017-05-22 ENCOUNTER — Encounter: Payer: Self-pay | Admitting: Internal Medicine

## 2017-05-22 VITALS — BP 138/82 | HR 67 | Ht 64.0 in | Wt 219.8 lb

## 2017-05-22 DIAGNOSIS — R05 Cough: Secondary | ICD-10-CM

## 2017-05-22 DIAGNOSIS — R058 Other specified cough: Secondary | ICD-10-CM

## 2017-05-22 DIAGNOSIS — I10 Essential (primary) hypertension: Secondary | ICD-10-CM

## 2017-05-22 DIAGNOSIS — D869 Sarcoidosis, unspecified: Secondary | ICD-10-CM

## 2017-05-22 NOTE — Progress Notes (Signed)
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  Last chronic prednisone 04/15/08 with no flare cough no nodules (right leg and left hand and right arm)   History of Present Illness  Last ov 02/17/13 no evidence active sarcoid rec Aciphex 20 mg Take 30-60 min before first meal of the day also when coughing for any reason take Pepcid 20 mg one at bedtime  Take delsym two tsp every 12 hours and supplement if needed with  tramadol 50 mg up to 2 every 4 hours to suppress the urge to cough. Swallowing water or using ice chips/non mint and menthol containing candies (such as lifesavers or sugarless jolly ranchers) are also effective.  You should rest your voice and avoid activities that you know make you cough. Once you have eliminated the cough for 3 straight days try reducing the tramadol first,  then the delsym as tolerated.   GERD diet    01/28/2014  Pulmonary consultation / Wert/ on no maint resp rx / rare baseline need for saba  Chief Complaint  Patient presents with  . Pulmonary Consult    Referred per Dr. Posey Pronto. [name] Cough is prod with moderate yellow sputum. She is using proair approx tid.  Onset was Jan 01 2014 while on aciphex with bfast with flu like illness but not follow contingency rx  On prednisone taper/ sp tamiflu/cough meds  Not limited by breathing from desired activities   rec Aciphex 20 mg Take 30-60 min before first meal of the day also when coughing for any reason take  Pepcid 20 mg one at bedtime and chlortrimeton 4 mg at bedtime Take delsym two tsp every 12 hours and supplement if needed with  tramadol 50 mg up to 2 every 4 hours to suppress the urge to cough.   Once you have eliminated the cough for 3 straight days try reducing the tramadol first,  then the delsym as tolerated.   GERD diet  F/u prn    08/22/2016  f/u ov/Wert re: cough mostly daytime non prod x May 2017 / not taking gerd rx as rec  Chief Complaint  Patient presents with  . Follow-up    Cough is unchanged. No new co's today.  Not limited by breathing from desired activities walks a mile 3 x weekly s stopping  rec Try not to clear your throat  - keep sugarless candy handy but no mint menthol or chocolate  Resume gerd rx > resolved    05/07/2017 acute extended ov/Wert re: cough > sob  Chief Complaint  Patient presents with  . Acute Visit    Cough not improving, non prod "feels like something in my throat". She is waking up in the night coughing.   was doing fine for months since last ov with me but not doing well as well since 02/18/17 when placed on lisinopril and gradually  downhill since on multple courses of prednisone and now symbicort (though very poor hfa) and much worse dry cough 24/7 no longer  has med calendar and doesn't even recognize the copy we have / very confused with meds  rec Stop lotensin and start valasartan 325 mg one daily  Start amlodipine 10 mg daily  Work on inhaler technique:  albuterol as a rescue medication to be used if you can't catch your breath For cough use the flutter valve and Take delsym two tsp every 12 hours and supplement if needed with  tramadol 50 mg up to 2 every 4 hours to suppress the urge to cough. Swallowing water or using ice chips/non mint and menthol containing candies (such as lifesavers or sugarless jolly ranchers) are also effective.  You should rest your voice and avoid activities that you know make you cough. Once you have eliminated the  cough for 3 straight days try reducing the tramadol first,  then the delsym as tolerated.   Finish your prednisone Follow the med calendar daily and bring it back with you to all office visits    05/22/2017  f/u ov/Wert re: cough > sob off acei x 2 weeks some better  Chief Complaint  Patient presents with  . Follow-up    Here to go over medication   doe = MMRC2 = can't walk a nl pace on a flat grade s sob but does fine slow and flat   Cough mostly daytime / dry/ never took tramadol correctly  No obvious day to day or daytime variability or assoc excess/ purulent sputum or mucus plugs or hemoptysis or cp or chest tightness, subjective wheeze or overt sinus or hb symptoms. No unusual exp hx or h/o childhood pna/ asthma or knowledge of premature birth.  Sleeping ok without nocturnal  or early am exacerbation  of respiratory  c/o's or need for noct saba. Also denies any obvious fluctuation of symptoms with weather or environmental changes or other aggravating or alleviating factors except as outlined above   Current Medications, Allergies, Complete Past Medical History, Past Surgical History, Family History, and Social History were reviewed in [employer] record.  ROS  The following are not active complaints unless bolded sore throat, dysphagia, dental problems, itching, sneezing,  nasal congestion or excess/ purulent secretions, ear ache,   fever, chills, sweats, unintended wt loss, classically pleuritic or exertional cp,  orthopnea pnd or leg swelling, presyncope, palpitations, abdominal pain, anorexia, nausea, vomiting, diarrhea  or change in bowel or bladder habits, change in stools or urine, dysuria,hematuria,  rash, arthralgias, visual complaints, headache, numbness, weakness or ataxia or problems with walking or coordination,  change in mood/affect or memory.                   Objective:   Physical Exam    amb obese  bf nad  Harsh upper airway cough    05/22/2017         219  05/07/2017       222  08/22/2016         216   05/17/2016        215   05/02/15 219 lb (99.338 kg)  04/07/15 219 lb 9.6 oz (99.61 kg)  03/24/15 216 lb (97.977 kg)    Vital signs reviewed - Note on arrival 02 sats  95% on RA     HEENT: nl dentition, turbinates, and orophanx. Nl external ear canals without cough reflex   NECK :  without JVD/Nodes/TM/ nl carotid upstrokes bilaterally   LUNGS: no acc muscle use,  Completely clear to and A and P s  cough on insp/exp    CV:  RRR  no s3 or murmur or increase in P2, no edema   ABD:  soft and nontender with nl excursion in the supine position. No bruits or organomegaly, bowel sounds nl  MS:  warm without deformities, calf tenderness, cyanosis or clubbing  SKIN: warm and dry without lesions  - minimal sarcoid changes at junction of nose and   upper lip  NEURO:  alert, approp, no deficits    I personally reviewed images and agree with radiology impression as follows:  CXR:   04/21/17 Stable exam. Hilar fullness with interstitial coarsening and basilar atelectasis. Imaging features likely sarcoidosis related.           Assessment & Plan:

## 2017-05-22 NOTE — Assessment & Plan Note (Signed)
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/02/2015 >>> - flare May 2017 p stopped aciphex > rec restart 05/17/2016  - flare since 02/2017 placed on lisinopril with no airflow obst 05/07/2017 by spirometry > d/c ACEi   Already some better but takes up to 6 weeks to see full results so rec continue off and recheck in 4 weeks with full med reconciliation   To keep things simple, I have asked the patient to first separate medicines that are perceived as maintenance, that is to be taken daily "no matter what", from those medicines that are taken on only on an as-needed basis and I have given the patient examples of both, and then return to see our NP to generate a  detailed  medication calendar which should be followed until the next physician sees the patient and updates it.    I had an extended discussion with the patient reviewing all relevant studies completed to date and  lasting 15 to 20 minutes of a 25 minute visit    Each maintenance medication was reviewed in detail including most importantly the difference between maintenance and prns and under what circumstances the prns are to be triggered using an action plan format that is not reflected in the computer generated alphabetically organized AVS.    Please see AVS for specific instructions unique to this visit that I personally wrote and verbalized to the the pt in detail and then reviewed with pt  by my nurse highlighting any  changes in therapy recommended at today's visit to their plan of care.

## 2017-05-22 NOTE — Patient Instructions (Addendum)
Take delsym two tsp every 12 hours and supplement if needed with  tramadol 50 mg up to 2 every 4 hours to suppress the urge to cough. Swallowing water or using ice chips/non mint and menthol containing candies (such as lifesavers or sugarless jolly ranchers) are also effective.  You should rest your voice and avoid activities that you know make you cough.  Once you have eliminated the cough for 3 straight days try reducing the tramadol first,  then the delsym as tolerated.    Prednisone 10 mg x 2  Each am x 3 days then 1 daily x 3 days and stop.  See [name] 4  weeks with all your medications, even over the counter meds, separated in two separate bags, the ones you take no matter what vs the ones you stop once you feel better and take only as needed when you feel you need them.   [name]  will generate for you a new user friendly medication calendar that will put [place] all on the same page re: your medication use.     Without this process, it simply isn't possible to assure that we are providing  your outpatient care  with  the attention to detail we feel you deserve.   If we cannot assure that you're getting that kind of care,  then we cannot manage your problem effectively from this clinic.  Once you have seen [name] and we are sure that we're all on the same page with your medication use she will arrange follow up with me.

## 2017-05-22 NOTE — Assessment & Plan Note (Signed)
D/c acei 05/07/2017 due to cough   bp ok off acei/ may need arb added next ov but note could not afford avapro  Advised will need formulary at f/u

## 2017-05-22 NOTE — Assessment & Plan Note (Signed)
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  - Chronic pred rx 2/09> 04/2008  - Restart Pred December 26, 2008 > June 12, 2009 stopped  > restarted 07/11/11>>pt stopped 10/15/11  - PFT's March 23, 2010 VC 1.77 (66%) no obst, ERV 34%, DLCO 53 > 115% corrected - PFT's 09/30/12       VC 2.02 (57%) ratio 78 , erv 13%  DLCO 55 > 118% corrected - Skin bx 05/19/14 bridge of nose > Pos NCG - 05/02/2015  Walked RA x 3 laps @ 185 ft each stopped due to end of study, no sob/ no desat at nl pace/ no cough provoked  - PFT's  08/22/2016 VC  1.81 ratio 82  with DLCO  59/55 % corrects to 88 % for alv volume  - Spirometry 05/07/2017   FVC  1.81  With no obst by fev1/fvc and no significant curvature  - 05/07/2017  After extensive coaching HFA effectiveness =    75% from a baseline of < 25%  - 05/22/2017   Walked RA x one lap @ 185 stopped due to  Sob at fast pace with sats 96%  Strongly doubt sarcoid airway involvement but did not take pred as prev rec to see if helped cough or sob so rec repeat 20 mg per day x 3 days and 10 mg x 3 days to see if helps at all then off to see if worse off  see avs for instructions unique to this ov

## 2017-06-19 NOTE — Progress Notes (Signed)
HPI:   [name] is a 61 y.o. female, who is here today to follow on some chronic medical problems. Since her last OV she has followed with Dr [name], pulmonologist. Respiratory symptoms have improved but still having intermittent wheezing,cough,and mild exertional dyspnea. She has Hx of sarcoidosis.  Hypertension:   Currently on HCTZ 25 mg daily Bisoprolol 10 mg daily, and Amlodipine 10 mg daily.The latter one has been added since her last OV. Lisinopril was discontinued by Dr [name] because it was aggravated the cough.   She is following low salt diet. Home BP's: "Normal", she does not recall readings.  Last eye exam: recently, sarcoidosis eye involvement (per pt report). She has an appt in few days, 06/23/17. She is taking medications as instructed, no side effects reported.  She has not noted unusual headache, visual changes, exertional chest pain, dyspnea,  focal weakness, or edema.   Lab Results  Component Value Date   CREATININE 0.74 02/21/2017   BUN 18 02/21/2017   NA 140 02/21/2017   K 4.0 02/21/2017   CL 102 02/21/2017   CO2 29 02/21/2017     Hyperlipidemia:  Currently on Zocor 20 mg daily, started last OV.  Following a low fat diet: Yes.  She has not noted side effects with medication.  Lab Results  Component Value Date   CHOL 255 (H) 02/21/2017   HDL 33.20 (L) 02/21/2017   LDLCALC (H) 01/21/2008    140        Total Cholesterol/HDL:CHD Risk Coronary Heart Disease Risk Table                     Men   Women  1/2 Average Risk   3.4   3.3   LDLDIRECT 145.0 02/21/2017   TRIG 286.0 (H) 02/21/2017   CHOLHDL 8 02/21/2017    Obesity: Dietary changes since her last OV: No major changes.She states that she loves vegetables and eat a good amount but uses butter and other types of fat for flavor. She also eats pasta frequently.  Exercise: She has not done so because dyspnea and pulmonary issues.  She would like a refill on Phentermine , which she  took in the past and tolerated well.  Concerns today:   About a month of intermittent cramps on waist and thigh with certain activities like bending down, twisting.  Alleviated by position changes and being still "holding" area. She has not noted rash,edema, or deformities.   Review of Systems  Constitutional: Negative for activity change, appetite change, fatigue, fever and unexpected weight change.  HENT: Negative for mouth sores, nosebleeds and trouble swallowing.   Eyes: Negative for redness and visual disturbance.  Respiratory: Positive for cough, shortness of breath and wheezing.   Cardiovascular: Negative for palpitations and leg swelling.  Gastrointestinal: Negative for abdominal pain, nausea and vomiting.       Negative for changes in bowel habits.  Endocrine: Negative for cold intolerance and heat intolerance.  Genitourinary: Negative for decreased urine volume and hematuria.  Musculoskeletal: Positive for myalgias (cramps). Negative for gait problem.  Skin: Negative for rash.  Neurological: Negative for syncope, weakness and headaches.  Psychiatric/Behavioral: Negative for confusion. The patient is not nervous/anxious.       Current Outpatient Prescriptions on File Prior to Visit  Medication Sig Dispense Refill  . albuterol (PROVENTIL HFA;VENTOLIN HFA) 108 (90 Base) MCG/ACT inhaler Inhale 2 puffs into the lungs every 4 (four) hours as needed for wheezing or  shortness of breath. 1 Inhaler 5  . amLODipine (NORVASC) 10 MG tablet Take 1 tablet (10 mg total) by mouth daily. 30 tablet 11  . bisoprolol (ZEBETA) 10 MG tablet Take 1 tablet (10 mg total) by mouth daily. (Patient taking differently: Take 10 mg by mouth daily with breakfast. ) 30 tablet 5  . famotidine (PEPCID) 20 MG tablet Take 20 mg by mouth at bedtime.    . famotidine-calcium carbonate-magnesium hydroxide (PEPCID COMPLETE) 10-800-165 MG chewable tablet Chew 1 tablet by mouth daily as needed.    . fluticasone  (FLONASE) 50 MCG/ACT nasal spray Place 2 sprays into both nostrils daily. 16 g 2  . hydrochlorothiazide (HYDRODIURIL) 25 MG tablet Take 1 tablet (25 mg total) by mouth every morning. 90 tablet 1  . mometasone (ELOCON) 0.1 % cream Apply 1 application topically as needed.     . pantoprazole (PROTONIX) 40 MG tablet Take 40 mg by mouth daily.    . simvastatin (ZOCOR) 20 MG tablet Take 1 tablet by mouth daily with supper. 90 tablet 1  . traMADol (ULTRAM) 50 MG tablet 1-2 every 4 hours as needed for cough or pain 40 tablet 0   No current facility-administered medications on file prior to visit.      Past Medical History:  Diagnosis Date  . Bifid uvula   . GERD (gastroesophageal reflux disease)   . History of cervical dysplasia   . History of chemotherapy    06/ 2005  . History of cleft lip    CORRECTED  . History of condyloma acuminatum    PERIANAL  . History of radiation therapy    completed 06/ 2005  . 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 History of vulvar dysplasia   . Hypertension   . 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 Pulmonary nodule, left   . 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 Sarcoid uveitis of left eye goes to [facility]--  currently no issues per pt 11-27-2016  . Upper airway cough syndrome   . Wears glasses    Allergies  Allergen Reactions  . Shellfish Allergy Itching and Swelling  . Penicillins Hives and Itching    Social History   Social History  . Marital status: Single    Spouse name: N/A  . Number of children: N/A  . Years of education: N/A   Occupational History  . 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 Smoking status: Former Smoker    Packs/day: 0.50    Years: 1.00    Types: Cigarettes    Quit date: 12/16/1993  . Smokeless tobacco: Never Used  . Alcohol use Yes     Comment: occasional   . Drug use: No  . Sexual activity: No   Other Topics Concern  . None   Social History Narrative  . None    Vitals:   06/20/17 0930  BP: 122/80  Pulse: 72  Resp: 12   Body mass index is 38.34 kg/m.  Wt Readings from Last 3 Encounters:  06/20/17 223 lb 6 oz (101.3 kg)  05/22/17 219 lb 12.8 oz (99.7 kg)  05/07/17 222 lb 9.6 oz (101 kg)     Physical Exam  Nursing note and vitals reviewed. Constitutional: She is oriented to person, place, and time. She appears well-developed. No distress.  HENT:  Head: Normocephalic and atraumatic.  Mouth/Throat: Oropharynx is clear and moist and mucous membranes are normal.  Eyes: Conjunctivae and EOM are normal. Pupils are equal, round, and reactive to light.  Cardiovascular: Normal rate and regular rhythm.   No murmur heard. Pulses:      Dorsalis pedis pulses are 2+ on the right side, and 2+ on the left side.  Varicose vein LE, bilateral.  Respiratory: Effort normal and breath sounds normal. No respiratory distress.  GI: Soft. She exhibits no mass. There is no hepatomegaly. There is no tenderness.  Musculoskeletal: She exhibits edema (1+ pitting LE bilateral.). She exhibits no tenderness.       Lumbar back: She exhibits no tenderness and no bony tenderness.  Lymphadenopathy:    She has no cervical adenopathy.  Neurological: She is alert and oriented to person, place, and time. She has normal strength. Gait normal.  Skin: Skin is warm. No rash noted. No erythema.  Psychiatric: She has a normal mood and affect.  Well groomed, good eye contact.     ASSESSMENT AND PLAN:    [name] was seen today for follow-up.  Diagnoses and all orders for this visit:  Lab Results  Component Value Date   CHOL 266 (H) 06/20/2017   HDL 30.30 (L) 06/20/2017   LDLCALC (H) 01/21/2008    140        Total Cholesterol/HDL:CHD Risk Coronary Heart Disease Risk Table                     Men   Women  1/2 Average Risk   3.4   3.3   LDLDIRECT  144.0 06/20/2017   TRIG (H) 06/20/2017    400.0 Triglyceride is over 400; calculations on Lipids are invalid.   CHOLHDL 9 06/20/2017   Lab Results  Component Value Date   CREATININE 0.78 06/20/2017   BUN 15 06/20/2017   NA 139 06/20/2017   K 4.1 06/20/2017   CL 101 06/20/2017   CO2 32 06/20/2017   Lab Results  Component Value Date   CKTOTAL 40 06/20/2017    Essential hypertension  Adequately controlled. No changes in current management. DASH-low salt diet recommended. F/U in 6 months, before if needed.  -     Comprehensive metabolic panel  Hyperlipidemia, mixed  We discussed risk of interaction between Zocor and Amlodipine, she agrees with changing it for Lipitor. We will follow labs done today to decide about dose.  -     Comprehensive metabolic panel -     Lipid panel  Morbid obesity due to excess calories (HCC)  We discussed benefits of wt loss as well as adverse effects of obesity. Consistency with healthy diet recommended. Due to respiratory symptoms she has limitations with exercise. We discussed a few pharmacologic treatment options and because costs, she would like to try Phentermine again. Phentermine side effects discussed, she will take 1/2 tab for 1-2 weeks then increase to a tab as tolerated. Total treatment for 12 weeks. F/U in 6 weeks.   -     phentermine (ADIPEX-P) 37.5 MG tablet; Take 1 tablet (37.5 mg total) by mouth daily before breakfast.  Cramps, muscle, general  Possible causes discussed, ? Statin Adequate hydration and wt loss may also help. Low impacts stretching exercises.  -     CK   -[name] was advised to return sooner than planned today if new concerns arise.       [name]. [name], MD  [place]. [place] office.

## 2017-06-20 ENCOUNTER — Encounter: Payer: Self-pay | Admitting: Family Medicine

## 2017-06-20 ENCOUNTER — Ambulatory Visit (INDEPENDENT_AMBULATORY_CARE_PROVIDER_SITE_OTHER): Payer: Self-pay | Admitting: Family Medicine

## 2017-06-20 VITALS — BP 122/80 | HR 72 | Resp 12 | Ht 64.0 in | Wt 223.4 lb

## 2017-06-20 DIAGNOSIS — I10 Essential (primary) hypertension: Secondary | ICD-10-CM

## 2017-06-20 DIAGNOSIS — R252 Cramp and spasm: Secondary | ICD-10-CM

## 2017-06-20 DIAGNOSIS — E782 Mixed hyperlipidemia: Secondary | ICD-10-CM

## 2017-06-20 LAB — COMPREHENSIVE METABOLIC PANEL
ALBUMIN: 3.9 g/dL (ref 3.5–5.2)
ALK PHOS: 125 U/L — AB (ref 39–117)
ALT: 21 U/L (ref 0–35)
AST: 19 U/L (ref 0–37)
BUN: 15 mg/dL (ref 6–23)
CALCIUM: 9.4 mg/dL (ref 8.4–10.5)
CHLORIDE: 101 meq/L (ref 96–112)
CO2: 32 mEq/L (ref 19–32)
Creatinine, Ser: 0.78 mg/dL (ref 0.40–1.20)
GFR: 97.37 mL/min (ref >60.00)
Glucose, Bld: 120 mg/dL — ABNORMAL HIGH (ref 70–99)
Potassium: 4.1 mEq/L (ref 3.5–5.1)
SODIUM: 139 meq/L (ref 135–145)
TOTAL PROTEIN: 7.7 g/dL (ref 6.0–8.3)
Total Bilirubin: 0.6 mg/dL (ref 0.2–1.2)

## 2017-06-20 LAB — LDL CHOLESTEROL, DIRECT: LDL DIRECT: 144 mg/dL

## 2017-06-20 LAB — LIPID PANEL
CHOLESTEROL: 266 mg/dL — AB (ref 0–200)
HDL: 30.3 mg/dL — ABNORMAL LOW (ref >39.00)
NonHDL: 235.75
Total CHOL/HDL Ratio: 9
VLDL: 80 mg/dL — AB (ref 0.0–40.0)

## 2017-06-20 LAB — CK: Total CK: 40 U/L (ref 7–177)

## 2017-06-20 MED ORDER — PHENTERMINE HCL 37.5 MG PO TABS: 37.5000 mg | ORAL_TABLET | Freq: Every day | ORAL | 1 refills | Status: DC

## 2017-06-20 NOTE — Patient Instructions (Signed)
A few things to remember from today's visit:   Essential hypertension - Plan: Comprehensive metabolic panel  Hyperlipidemia, mixed - Plan: Comprehensive metabolic panel, Lipid panel  Morbid obesity due to excess calories (HCC) - Plan: phentermine (ADIPEX-P) 37.5 MG tablet  Cramps, muscle, general - Plan: CK   Please be sure medication list is accurate. If a new problem present, please set up appointment sooner than planned today.

## 2017-06-21 MED ORDER — ATORVASTATIN CALCIUM 20 MG PO TABS: 20.0000 mg | ORAL_TABLET | Freq: Every day | ORAL | 1 refills | Status: DC

## 2017-06-23 ENCOUNTER — Encounter: Payer: Self-pay | Admitting: Adult Health

## 2017-07-07 ENCOUNTER — Ambulatory Visit (INDEPENDENT_AMBULATORY_CARE_PROVIDER_SITE_OTHER): Payer: Self-pay | Admitting: Internal Medicine

## 2017-07-07 ENCOUNTER — Encounter: Payer: Self-pay | Admitting: Internal Medicine

## 2017-07-07 VITALS — BP 162/92 | HR 86 | Ht 65.5 in | Wt 223.0 lb

## 2017-07-07 DIAGNOSIS — R058 Other specified cough: Secondary | ICD-10-CM

## 2017-07-07 DIAGNOSIS — I10 Essential (primary) hypertension: Secondary | ICD-10-CM

## 2017-07-07 DIAGNOSIS — K089 Disorder of teeth and supporting structures, unspecified: Secondary | ICD-10-CM

## 2017-07-07 DIAGNOSIS — R05 Cough: Secondary | ICD-10-CM

## 2017-07-07 DIAGNOSIS — D869 Sarcoidosis, unspecified: Secondary | ICD-10-CM

## 2017-07-07 MED ORDER — PANTOPRAZOLE SODIUM 40 MG PO TBEC: DELAYED_RELEASE_TABLET | ORAL | 11 refills | Status: DC

## 2017-07-07 MED ORDER — FAMOTIDINE 20 MG PO TABS: 20.0000 mg | ORAL_TABLET | Freq: Every day | ORAL | 11 refills | Status: DC

## 2017-07-07 NOTE — Patient Instructions (Signed)
Pantoprazole (protonix) 40 mg   Take  30-60 min before first meal of the day and Pepcid (famotidine)  20 mg one @  bedtime until return to office - this is the best way to tell whether stomach acid is contributing to your problem.    GERD (REFLUX)  is an extremely common cause of respiratory symptoms just like yours , many times with no obvious heartburn at all.    It can be treated with medication, but also with lifestyle changes including elevation of the head of your bed (ideally with 6 inch  bed blocks),  Smoking cessation, avoidance of late meals, excessive alcohol, and avoid fatty foods, chocolate, peppermint, colas, red wine, and acidic juices such as orange juice.  NO MINT OR MENTHOL PRODUCTS SO NO COUGH DROPS   USE SUGARLESS CANDY INSTEAD (Jolley ranchers or Stover's or Life Savers) or even ice chips will also do - the key is to swallow to prevent all throat clearing. NO OIL BASED VITAMINS - use powdered substitutes.   See [name] in 6  weeks with all your medications, even over the counter meds, separated in two separate bags, the ones you take no matter what vs the ones you stop once you feel better and take only as needed when you feel you need them.   [name]  will generate for you a new user friendly medication calendar that will put [place] all on the same page re: your medication use.     Without this process, it simply isn't possible to assure that we are providing  your outpatient care  with  the attention to detail we feel you deserve.   If we cannot assure that you're getting that kind of care,  then we cannot manage your problem effectively from this clinic.  Once you have seen [name] and we are sure that we're all on the same page with your medication use she will arrange follow up with me.

## 2017-07-07 NOTE — Assessment & Plan Note (Signed)
D/c acei 05/07/2017 due to cough     Not optimally controlled on present regimen. I reviewed this with the patient and emphasized importance of taking meds regularly / consistently each am >>  follow-up with primary care planned

## 2017-07-07 NOTE — Assessment & Plan Note (Signed)
See ov 07/07/2017 > rec dental f/u asap

## 2017-07-07 NOTE — Assessment & Plan Note (Signed)
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/02/2015 >>> - flare May 2017 p stopped aciphex > rec restart 05/17/2016  - flare since 02/2017 placed on lisinopril with no airflow obst 05/07/2017 by spirometry > d/c ACEi  Permanently  - improved 07/07/2017 but not on gerd rx > rec resume full gerd rx/ stay off acei    Upper airway cough syndrome (previously labeled PNDS) , is  so named because it's frequently impossible to sort out how much is  CR/sinusitis with freq throat clearing (which can be related to primary GERD)   vs  causing  secondary (" extra esophageal")  GERD from wide swings in gastric pressure that occur with throat clearing, often  promoting self use of mint and menthol lozenges that reduce the lower esophageal sphincter tone and exacerbate the problem further in a cyclical fashion.   These are the same pts (now being labeled as having "irritable larynx syndrome" by some cough centers) who not infrequently have a history of having failed to tolerate ace inhibitors,  dry powder inhalers or biphosphonates or report having atypical/extraesophageal reflux symptoms that don't respond to standard doses of PPI  and are easily confused as having aecopd or asthma flares by even experienced allergists/ pulmonologists (myself included).    Therefore needs to take gerd rx as rec and continue off acei then return with all meds in hand using a trust but verify approach to confirm accurate Medication  Reconciliation The principal here is that until we are certain that the  patients are doing what we've asked, it makes no sense to ask them to do more.   To keep things simple, I have asked the patient to first separate medicines that are perceived as maintenance, that is to be taken daily "no matter what", from those medicines that are taken on only on an as-needed basis and I have given the patient examples of both, and then return to see our NP to generate a  detailed  medication calendar which should be followed until the next  physician sees the patient and updates it.

## 2017-07-07 NOTE — Progress Notes (Addendum)
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  Last chronic prednisone 04/15/08 with no flare cough no nodules (right leg and left hand and right arm)   History of Present Illness  Last ov 02/17/13 no evidence active sarcoid rec Aciphex 20 mg Take 30-60 min before first meal of the day also when coughing for any reason take Pepcid 20 mg one at bedtime  Take delsym two tsp every 12 hours and supplement if needed with  tramadol 50 mg up to 2 every 4 hours to suppress the urge to cough. Swallowing water or using ice chips/non mint and menthol containing candies (such as lifesavers or sugarless jolly ranchers) are also effective.  You should rest your voice and avoid activities that you know make you cough. Once you have eliminated the cough for 3 straight days try reducing the tramadol first,  then the delsym as tolerated.   GERD diet    01/28/2014  Pulmonary consultation / Obelia Bonello/ on no maint resp rx / rare baseline need for saba  Chief Complaint  Patient presents with  . Pulmonary Consult    Referred per Dr. Posey Pronto. [name] Cough is prod with moderate yellow sputum. She is using proair approx tid.  Onset was Jan 01 2014 while on aciphex with bfast with flu like illness but not follow contingency rx  On prednisone taper/ sp tamiflu/cough meds  Not limited by breathing from desired activities   rec Aciphex 20 mg Take 30-60 min before first meal of the day also when coughing for any reason take  Pepcid 20 mg one at bedtime and chlortrimeton 4 mg at bedtime Take delsym two tsp every 12 hours and supplement if needed with  tramadol 50 mg up to 2 every 4 hours to suppress the urge to cough.   Once you have eliminated the cough for 3 straight days try reducing the tramadol first,  then the delsym as tolerated.   GERD diet  F/u prn    08/22/2016  f/u ov/Jerimyah Vandunk re: cough mostly daytime non prod x May 2017 / not taking gerd rx as rec  Chief Complaint  Patient presents with  . Follow-up    Cough is unchanged. No new co's today.  Not limited by breathing from desired activities walks a mile 3 x weekly s stopping  rec Try not to clear your throat  - keep sugarless candy handy but no mint menthol or chocolate  Resume gerd rx > resolved    05/07/2017 acute extended ov/Daivik Overley re: cough > sob  Chief Complaint  Patient presents with  . Acute Visit    Cough not improving, non prod "feels like something in my throat". She is waking up in the night coughing.   was doing fine for months since last ov with me but not doing well as well since 02/18/17 when placed on lisinopril and gradually  downhill since on multple courses of prednisone and now symbicort (though very poor hfa) and much worse dry cough 24/7 no longer  has med calendar and doesn't even recognize the copy we have / very confused with meds  rec Stop lotensin and start valasartan 325 mg one daily  Start amlodipine 10 mg daily  Work on inhaler technique:  albuterol as a rescue medication to be used if you can't catch your breath For cough use the flutter valve and Take delsym two tsp every 12 hours and supplement if needed with  tramadol 50 mg up to 2 every 4 hours to suppress the urge to cough. Swallowing water or using ice chips/non mint and menthol containing candies (such as lifesavers or sugarless jolly ranchers) are also effective.  You should rest your voice and avoid activities that you know make you cough. Once you have eliminated the  cough for 3 straight days try reducing the tramadol first,  then the delsym as tolerated.   Finish your prednisone Follow the med calendar daily and bring it back with you to all office visits    05/22/2017  f/u ov/Garold Sheeler re: cough > sob off acei x 2 weeks some better  Chief Complaint  Patient presents with  . Follow-up    Here to go over medication   doe = MMRC2 = can't walk a nl pace on a flat grade s sob but does fine slow and flat   Cough mostly daytime / dry/ never took tramadol correctly rec Take delsym two tsp every 12 hours and supplement if needed with  tramadol 50 mg up to 2 every 4 hours to suppress the urge to cough  Prednisone 10 mg x 2  Each am x 3 days then 1 daily x 3 days and stop. 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 Follow-up    Cough and SOB are some better, but not back to her normal baseline. Her cough is non prod. She also notices some wheezing.   rarely if ever uses saba at hs/ mostly daytime need of over doe it with exertion / no following recs for gerd/ no longer on ppi or h2 hs - much less need for saba than prior to stopping acei   Did not see [name] for med calendar as req / did not take am meds yet   No obvious day to day or daytime variability or assoc excess/ purulent sputum or mucus plugs or hemoptysis or cp or chest tightness, subjective wheeze or overt sinus or hb symptoms. No unusual exp hx or h/o childhood pna/ asthma or knowledge of premature birth.  Sleeping ok without nocturnal  or early am exacerbation  of respiratory  c/o's or need for noct saba. Also denies any obvious fluctuation of symptoms with weather or environmental changes or other aggravating or alleviating factors except as outlined above   Current Medications, Allergies, Complete Past Medical History, Past Surgical History, Family History, and Social History were  reviewed in [employer] record.  ROS  The following are not active complaints unless bolded sore throat, dysphagia, dental problems, itching, sneezing,  nasal congestion or excess/ purulent secretions, ear ache,   fever, chills, sweats, unintended wt loss, classically pleuritic or exertional cp,  orthopnea pnd or leg swelling, presyncope, palpitations, abdominal pain, anorexia, nausea, vomiting, diarrhea  or change in bowel or bladder habits, change in stools or urine, dysuria,hematuria,  rash, arthralgias, visual complaints, headache, numbness, weakness or  ataxia or problems with walking or coordination,  change in mood/affect or memory.               Objective:   Physical Exam    amb obese  bf nad  occ throat clearing   07/07/2017      223  05/22/2017         219  05/07/2017       222  08/22/2016         216   05/17/2016        215   05/02/15 219 lb (99.338 kg)  04/07/15 219 lb 9.6 oz (99.61 kg)  03/24/15 216 lb (97.977 kg)    Vital signs reviewed - Note on arrival 02 sats  97% on RA and bp 162/92 s am meds   HEENT: nl  turbinates, and orophanx. Nl external ear canals without cough reflex - poor dentition with several severely decayed molars    NECK :  without JVD/Nodes/TM/ nl carotid upstrokes bilaterally   LUNGS: no acc muscle use,  Completely clear to and A and P s cough on insp/exp maneuvers     CV:  RRR  no s3 or murmur or increase in P2, no edema   ABD:  soft and nontender with nl excursion in the supine position. No bruits or organomegaly, bowel sounds nl  MS:  warm without deformities, calf tenderness, cyanosis or clubbing  SKIN: warm and dry without lesions  - minimal sarcoid changes at junction of nose and   upper lip  NEURO:  alert, approp, no deficits    I personally reviewed images and agree with radiology impression as follows:  CXR:   04/21/17 Stable exam. Hilar fullness with interstitial coarsening and basilar atelectasis. Imaging  features likely sarcoidosis related.           Assessment & Plan:

## 2017-07-07 NOTE — Assessment & Plan Note (Signed)
No evidence of active pulmonary dz >  ocular rx only for now/ f/u with opth planned

## 2017-07-07 NOTE — Assessment & Plan Note (Signed)
Body mass index is 36.54 kg/m.  -  trending up  Lab Results  Component Value Date   TSH 3.63 02/21/2017     Contributing to gerd risk/ doe/reviewed the need and the process to achieve and maintain neg calorie balance > defer f/u primary care including intermittently monitoring thyroid status

## 2017-07-24 ENCOUNTER — Telehealth: Payer: Self-pay | Admitting: Internal Medicine

## 2017-07-24 NOTE — Telephone Encounter (Signed)
lmtcb X1 for [place] at [place].

## 2017-07-25 NOTE — Telephone Encounter (Signed)
lmomtcb x 2 for [place] from [place].

## 2017-07-28 NOTE — Telephone Encounter (Signed)
lmomtcb x 3 for [place] with [employer].

## 2017-07-28 NOTE — Telephone Encounter (Signed)
I have called and LMOM for Mendel Ryder with [facility] to make her aware of MW recs that she have not pulmonary restrictions.  Will sign off at this time.

## 2017-07-28 NOTE — Telephone Encounter (Signed)
Mendel Ryder returned call with [place]. 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 We sent to [place] notes from 04/2017 and 05/2017. Mendel Ryder is requesting if MW has any work restrictions for pt or if he wants to defer to another provider or if there are no restrictions at all for pt from a pulmonary standpoint. Mendel Ryder states all pt's providers have to give input on pt's work restrictions.  Pt has an orthopedic provider regarding her ulnar nerve lesion. Mendel Ryder is available at [phone] until 3pm today 07/28/17.   Dr. Melvyn Novas please advise if pt has any work restrictions. Thanks.

## 2017-07-28 NOTE — Telephone Encounter (Signed)
No pulmonary restrictions

## 2017-08-06 ENCOUNTER — Ambulatory Visit: Payer: Self-pay | Admitting: Family Medicine

## 2017-08-10 NOTE — Progress Notes (Deleted)
HPI:   [name] is a 61 y.o. female, who is here today for 6 weeks follow-up on obesity.  She was seen on 06/20/17, when she was started on Phentermine 37.5 mg daily.  Dietary changes since her last OV: *** Exercise: ***  Side effects from medication.  She has *** noted wt loss, about ***  On 06/20/17 FG elevated at 120. Also alk phosphatase at 125. It has been elevated before, 2 years ago it was 128.  Lab Results  Component Value Date   ALT 21 06/20/2017   AST 19 06/20/2017   ALKPHOS 125 (H) 06/20/2017   BILITOT 0.6 06/20/2017     Review of Systems    Current Outpatient Prescriptions on File Prior to Visit  Medication Sig Dispense Refill  . albuterol (PROVENTIL HFA;VENTOLIN HFA) 108 (90 Base) MCG/ACT inhaler Inhale 2 puffs into the lungs every 4 (four) hours as needed for wheezing or shortness of breath. 1 Inhaler 5  . amLODipine (NORVASC) 10 MG tablet Take 1 tablet (10 mg total) by mouth daily. 30 tablet 11  . bisoprolol (ZEBETA) 10 MG tablet Take 1 tablet (10 mg total) by mouth daily. (Patient taking differently: Take 10 mg by mouth daily with breakfast. ) 30 tablet 5  . Difluprednate (DUREZOL) 0.05 % EMUL Apply to eye as directed.    . famotidine (PEPCID) 20 MG tablet Take 1 tablet (20 mg total) by mouth at bedtime. 30 tablet 11  . famotidine-calcium carbonate-magnesium hydroxide (PEPCID COMPLETE) 10-800-165 MG chewable tablet Chew 1 tablet by mouth daily as needed.    . fluticasone (FLONASE) 50 MCG/ACT nasal spray Place 2 sprays into both nostrils daily. 16 g 2  . hydrochlorothiazide (HYDRODIURIL) 25 MG tablet Take 1 tablet (25 mg total) by mouth every morning. 90 tablet 1  . lidocaine-hydrocortisone (ANAMANTEL HC) 3-0.5 % CREA Place 1 Applicatorful rectally as needed.    . mometasone (ELOCON) 0.1 % cream Apply 1 application topically as needed.     . pantoprazole (PROTONIX) 40 MG tablet Take 30-60 min before first meal of the day 30 tablet 11  .  simvastatin (ZOCOR) 20 MG tablet Take 20 mg by mouth daily.    . traMADol (ULTRAM) 50 MG tablet 1-2 every 4 hours as needed for cough or pain 40 tablet 0   No current facility-administered medications on file prior to visit.      Past Medical History:  Diagnosis Date  . Bifid uvula   . GERD (gastroesophageal reflux disease)   . History of cervical dysplasia   . History of chemotherapy    06/ 2005  . History of cleft lip    CORRECTED  . History of condyloma acuminatum    PERIANAL  . History of radiation therapy    completed 06/ 2005  . 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 History of vulvar dysplasia   . Hypertension   . 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 Pulmonary nodule, left   . 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 Sarcoid uveitis of left eye goes to [facility]--  currently no issues per pt 11-27-2016  . Upper airway cough syndrome   . Wears glasses    Allergies  Allergen Reactions  . Shellfish Allergy Itching and Swelling  . Penicillins Hives and Itching  Social History   Social History  . Marital status: Single    Spouse name: N/A  . Number of children: N/A  . Years of education: N/A   Occupational History  . 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 Smoking status: Former Smoker    Packs/day: 0.50    Years: 1.00    Types: Cigarettes    Quit date: 12/16/1993  . Smokeless tobacco: Never Used  . Alcohol use Yes     Comment: occasional  . Drug use: No  . Sexual activity: No   Other Topics Concern  . Not on file   Social History Narrative  . No narrative on file    There were no vitals filed for this visit. There is no height or weight on file to calculate BMI.  Wt Readings from Last 3 Encounters:  07/07/17 223 lb (101.2 kg)  06/20/17 223 lb 6 oz (101.3 kg)  05/22/17 219 lb 12.8 oz (99.7  kg)      Physical Exam    ASSESSMENT AND PLAN:     There are no diagnoses linked to this encounter.             [name]. [name], MD  [facility]. [place] office.

## 2017-08-11 ENCOUNTER — Ambulatory Visit: Payer: Self-pay | Admitting: Family Medicine

## 2017-08-12 NOTE — Progress Notes (Signed)
HPI:   [name] is a 61 y.o. female, who is here today for 6 weeks follow-up on obesity.  She was seen on 06/20/17, when she was started on Phentermine 37.5 mg daily.  Dietary changes since her last OV: Decreased salt intake, decreased soda intake, no sweets, increased water intake. Exercise: Not doing it regularly because SOB and wheezing. Hx of sarcoidosis, she has appt with Dr Melvyn Novas 08/2017.  She started Phentermine 37.5 mg 1/2 tab daily for a week but forgot to continue taking it. No side effects from medication.  She has noted wt loss, she went from size 18 to 16. Has noted about 4 Lb wt loss according to her scale at home.  On 06/20/17 FG elevated at 120. No Hx of DM.  Also alk phosphatase at 125. It has been elevated before, 2 years ago it was 128.  Lab Results  Component Value Date   ALT 21 06/20/2017   AST 19 06/20/2017   ALKPHOS 125 (H) 06/20/2017   BILITOT 0.6 06/20/2017   -She is also asking if she can have lesion on buttocks removed. Lesion on inner aspect of raised skin lesion that is frequently irritated by contact with clothes or by wiping after defecation. According to pt, she has seen dermatologists and oncologists, Bx was done and it was not malignant but rather related to sarcoidosis. Lesions is growing and it is sore. I did referred her to surgeon in 09/2016, she was referred from this office to oncologists. She has not scheduled a f/u appt.   Review of Systems  Constitutional: Negative for activity change, appetite change, fatigue and fever.  HENT: Negative for mouth sores, nosebleeds and trouble swallowing.   Eyes: Negative for redness and visual disturbance.  Respiratory: Positive for shortness of breath and wheezing. Negative for cough.   Cardiovascular: Negative for chest pain, palpitations and leg swelling.  Gastrointestinal: Negative for abdominal pain, nausea and vomiting.       Negative for changes in bowel habits.  Endocrine:  Negative for polydipsia, polyphagia and polyuria.  Musculoskeletal: Negative for gait problem and myalgias.  Skin: Positive for wound. Negative for rash.  Neurological: Negative for syncope, weakness and headaches.  Psychiatric/Behavioral: Negative for confusion. The patient is nervous/anxious.       Current Outpatient Prescriptions on File Prior to Visit  Medication Sig Dispense Refill  . albuterol (PROVENTIL HFA;VENTOLIN HFA) 108 (90 Base) MCG/ACT inhaler Inhale 2 puffs into the lungs every 4 (four) hours as needed for wheezing or shortness of breath. 1 Inhaler 5  . amLODipine (NORVASC) 10 MG tablet Take 1 tablet (10 mg total) by mouth daily. 30 tablet 11  . bisoprolol (ZEBETA) 10 MG tablet Take 1 tablet (10 mg total) by mouth daily. (Patient taking differently: Take 10 mg by mouth daily with breakfast. ) 30 tablet 5  . Difluprednate (DUREZOL) 0.05 % EMUL Apply to eye as directed.    . famotidine (PEPCID) 20 MG tablet Take 1 tablet (20 mg total) by mouth at bedtime. 30 tablet 11  . famotidine-calcium carbonate-magnesium hydroxide (PEPCID COMPLETE) 10-800-165 MG chewable tablet Chew 1 tablet by mouth daily as needed.    . fluticasone (FLONASE) 50 MCG/ACT nasal spray Place 2 sprays into both nostrils daily. 16 g 2  . hydrochlorothiazide (HYDRODIURIL) 25 MG tablet Take 1 tablet (25 mg total) by mouth every morning. 90 tablet 1  . lidocaine-hydrocortisone (ANAMANTEL HC) 3-0.5 % CREA Place 1 Applicatorful rectally as needed.    Marland Kitchen  mometasone (ELOCON) 0.1 % cream Apply 1 application topically as needed.     . pantoprazole (PROTONIX) 40 MG tablet Take 30-60 min before first meal of the day 30 tablet 11  . simvastatin (ZOCOR) 20 MG tablet Take 20 mg by mouth daily.    . traMADol (ULTRAM) 50 MG tablet 1-2 every 4 hours as needed for cough or pain 40 tablet 0   No current facility-administered medications on file prior to visit.      Past Medical History:  Diagnosis Date  . Bifid uvula   .  GERD (gastroesophageal reflux disease)   . History of cervical dysplasia   . History of chemotherapy    06/ 2005  . History of cleft lip    CORRECTED  . History of condyloma acuminatum    PERIANAL  . History of radiation therapy    completed 06/ 2005  . 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 History of vulvar dysplasia   . Hypertension   . 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 Pulmonary nodule, left   . 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 Sarcoid uveitis of left eye goes to [facility]--  currently no issues per pt 11-27-2016  . Upper airway cough syndrome   . Wears glasses    Allergies  Allergen Reactions  . Shellfish Allergy Itching and Swelling  . Penicillins Hives and Itching    Social History   Social History  . Marital status: Single    Spouse name: N/A  . Number of children: N/A  . Years of education: N/A   Occupational History  . 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 Smoking status: Former Smoker    Packs/day: 0.50    Years: 1.00    Types: Cigarettes    Quit date: 12/16/1993  . Smokeless tobacco: Never Used  . Alcohol use Yes     Comment: occasional  . Drug use: No  . Sexual activity: No   Other Topics Concern  . None   Social History Narrative  . None    Vitals:   08/13/17 0920  BP: 122/76  Pulse: 71  Resp: 12  SpO2: 95%   Body mass index is 38.32 kg/m.  Wt Readings from Last 3 Encounters:  08/13/17 223 lb 4 oz (101.3 kg)  07/07/17 223 lb (101.2 kg)  06/20/17 223 lb 6 oz (101.3 kg)    Physical Exam  Nursing note and vitals reviewed. Constitutional: She is oriented to person, place, and time. She appears well-developed. No distress.  HENT:  Head: Normocephalic and atraumatic.  Mouth/Throat: Oropharynx is clear and moist and mucous membranes are normal.  Eyes: Conjunctivae are  normal.  Cardiovascular: Normal rate and regular rhythm.   No murmur heard. Respiratory: Effort normal and breath sounds normal. No respiratory distress.  Genitourinary:  Genitourinary Comments: Between gluteal fold,on left inner gluteus there is a raised lesions, about 4-5 cm, hyperpigmented.   Musculoskeletal: She exhibits no edema.  Neurological: She is alert and oriented to person, place, and time.  Skin: Skin is warm. Rash noted.  Psychiatric: She has a normal mood and affect. Her speech is normal.  Well groomed, good eye contact.    ASSESSMENT AND PLAN:  Ms. Jeneal was seen today for follow-up.  Diagnoses and all orders for this visit:  BMI 38.0-38.9,adult  She has noted wt loss just with non pharmacologic treatment. We discussed benefits of wt loss as well as adverse effects of obesity. Consistency with healthy diet and physical activity recommended. She has Phentermine at home, so she will resume. Side effects discussed. Daily brisk walking for 15-30 min as tolerated.  Neoplasm of uncertain behavior of perianal skin  ? Condyloma. She reports Bx done and determine this was related to sarcoidosis. It is affecting her daily activities, also risk for infection, and growing. Recommend calling  and scheduled a follow-up.  Exertional dyspnea  With associated wheezing. Recommend using Albuterol 15-20 min before exercising. Instructed about warning signs. Keep appt with Dr Melvyn Novas.      Doil Kamara G. [name], MD  [facility]. [place] office.

## 2017-08-13 ENCOUNTER — Encounter: Payer: Self-pay | Admitting: Family Medicine

## 2017-08-13 ENCOUNTER — Ambulatory Visit (INDEPENDENT_AMBULATORY_CARE_PROVIDER_SITE_OTHER): Payer: PRIVATE HEALTH INSURANCE | Admitting: Family Medicine

## 2017-08-13 VITALS — BP 122/76 | HR 71 | Resp 12 | Ht 64.0 in | Wt 223.2 lb

## 2017-08-13 DIAGNOSIS — R0609 Other forms of dyspnea: Secondary | ICD-10-CM | POA: Diagnosis not present

## 2017-08-13 DIAGNOSIS — Z6838 Body mass index (BMI) 38.0-38.9, adult: Secondary | ICD-10-CM

## 2017-08-13 DIAGNOSIS — D485 Neoplasm of uncertain behavior of skin: Secondary | ICD-10-CM

## 2017-08-13 NOTE — Patient Instructions (Addendum)
A few things to remember from today's visit:   BMI 38.0-38.9,adult  Resume Phentermine.  What are some tips for weight loss? People become overweight for many reasons. Weight issues can run in families. They can be caused by unhealthy behaviors and a person's environment. Certain health problems and medicines can also lead to weight gain. There are some simple things you can do to reach and maintain a healthy weight:  Eat small more frequent healthy meals instead 3 bid meals. Also Weight Watchers is a good option. Avoid sweet drinks. These include regular soft drinks, fruit juices, fruit drinks, energy drinks, sweetened iced tea, and flavored milk. Avoid fast foods. Fast foods such as french fries, hamburgers, chicken nuggets, and pizza are high in calories and can cause weight gain. Eat a healthy breakfast. People who skip breakfast tend to weigh more. Don't watch more than two hours of television per day. Chew sugar-free gum between meals to cut down on snacking. Avoid grocery shopping when you're hungry. Pack a healthy lunch instead of eating out to control what and how much you eat. Eat a lot of fruits and vegetables. Aim for about 2 cups of fruit and 2 to 3 cups of vegetables per day. Aim for 150 minutes per week of moderate-intensity exercise (such as brisk walking), or 75 minutes per week of vigorous exercise (such as jogging or running). OR 15-30 min of daily brisk walking. Be more active. Small changes in physical activity can easily be added to your daily routine. For example, take the stairs instead of the elevator. Take a walk with your family. A daily walk is a great way to get exercise and to catch up on the day's events.   Please be sure medication list is accurate. If a new problem present, please set up appointment sooner than planned today.

## 2017-08-19 ENCOUNTER — Ambulatory Visit (INDEPENDENT_AMBULATORY_CARE_PROVIDER_SITE_OTHER): Payer: PRIVATE HEALTH INSURANCE | Admitting: Adult Health

## 2017-08-19 ENCOUNTER — Encounter: Payer: Self-pay | Admitting: Adult Health

## 2017-08-19 ENCOUNTER — Telehealth: Payer: Self-pay | Admitting: Adult Health

## 2017-08-19 DIAGNOSIS — R05 Cough: Secondary | ICD-10-CM

## 2017-08-19 DIAGNOSIS — D869 Sarcoidosis, unspecified: Secondary | ICD-10-CM

## 2017-08-19 DIAGNOSIS — R058 Other specified cough: Secondary | ICD-10-CM

## 2017-08-19 MED ORDER — PREDNISONE 10 MG PO TABS: ORAL_TABLET | ORAL | 1 refills | Status: DC

## 2017-08-19 NOTE — Progress Notes (Signed)
Chart and office note reviewed in detail  > agree with a/p as outlined    

## 2017-08-19 NOTE — Progress Notes (Signed)
@Patient  ID: [name], female    DOB: 1956/04/11, 61 y.o.   MRN: [id number]  Chief Complaint  Patient presents with  . 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 Last chronic prednisone 04/15/08 with no flare cough no nodules (right leg and left hand and right arm) 04/2017 ACE inhibitor d/c   08/19/2017 Follow up : Sarcoid (pulmonary Arnetha Courser /skin involvement )  and Cough  Patient presents for 6 week follow-up. Says overall her cough is doing a little bit better. She still has some intermittent dry cough. Patient has known sarcoidosis. She has pulmonary ocular and skin involvement. Patient says that she's been dealing with a perirectal lesions for the last several months. She's been seen by the surgeon and dermatologist. She says the skin biopsies have been positive for sarcoid. She says the areas are getting bigger and are becoming more and more uncomfortable. She says she is also followed by ophthalmology for eye issues secondary to sarcoid . He says she's had to take increased eyedrops due to worsening eye pressures..  CXR in May with stable sarcoid changes.   We reviewed all her medications and organized them into a medication calendar , with patient education. Appears she is taking her medications correctly.   Allergies  Allergen Reactions  . Shellfish Allergy Itching and Swelling  . Penicillins Hives and Itching    Immunization History  Administered Date(s) Administered  . Influenza Split 10/01/2011, 09/23/2012  . Influenza Whole 09/15/2010  . Tdap 07/06/2013    Past Medical History:  Diagnosis Date  . Bifid  uvula   . GERD (gastroesophageal reflux disease)   . History of cervical dysplasia   . History of chemotherapy    06/ 2005  . History of cleft lip    CORRECTED  . History of condyloma acuminatum    PERIANAL  . History of radiation therapy    completed 06/ 2005  . 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 History of vulvar dysplasia   . Hypertension   . 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 Pulmonary nodule, left   . 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 Sarcoid uveitis of [facility] eye goes to [facility]--  currently no issues per pt 11-27-2016  . Upper airway cough syndrome   . Wears glasses     Tobacco History: History  Smoking Status  . Former Smoker  . Packs/day: 0.50  . Years: 1.00  . Types: Cigarettes  . Quit date: 12/16/1993  Smokeless Tobacco  . Never Used   Counseling given: Not Answered   Outpatient Encounter Prescriptions as of 08/19/2017  Medication Sig  . albuterol (PROVENTIL HFA;VENTOLIN HFA) 108 (90 Base) MCG/ACT inhaler Inhale 2 puffs into the lungs every 4 (four) hours as needed for wheezing or shortness of breath.  Marland Kitchen amLODipine (NORVASC) 10 MG tablet Take 1 tablet (10 mg total) by mouth daily.  . bisoprolol (ZEBETA) 10 MG tablet Take 1 tablet (10 mg total) by mouth daily. (Patient taking differently: Take 10 mg by mouth daily  with breakfast. )  . famotidine (PEPCID) 20 MG tablet Take 1 tablet (20 mg total) by mouth at bedtime.  . famotidine-calcium carbonate-magnesium hydroxide (PEPCID COMPLETE) 10-800-165 MG chewable tablet Chew 1 tablet by mouth daily as needed.  . fluticasone (FLONASE) 50 MCG/ACT nasal spray Place 2 sprays into both nostrils daily.  . hydrochlorothiazide (HYDRODIURIL) 25 MG tablet Take 1 tablet (25 mg total) by mouth every morning.  . lidocaine-hydrocortisone (ANAMANTEL HC) 3-0.5 % CREA Place 1  Applicatorful rectally as needed.  . mometasone (ELOCON) 0.1 % cream Apply 1 application topically as needed.   . pantoprazole (PROTONIX) 40 MG tablet Take 30-60 min before first meal of the day  . predniSONE (DELTASONE) 10 MG tablet 4 tabs daily for 5 days, then 2 tabs daily and hold at this dose.  . simvastatin (ZOCOR) 20 MG tablet Take 20 mg by mouth daily.  . traMADol (ULTRAM) 50 MG tablet 1-2 every 4 hours as needed for cough or pain  . [DISCONTINUED] Difluprednate (DUREZOL) 0.05 % EMUL Apply to eye as directed.   No facility-administered encounter medications on file as of 08/19/2017.      Review of Systems  Constitutional:   No  weight loss, night sweats,  Fevers, chills,  +fatigue, or  lassitude.  HEENT:   No headaches,  Difficulty swallowing,  Tooth/dental problems, or  Sore throat,                No sneezing, itching, ear ache, nasal congestion, post nasal drip,   CV:  No chest pain,  Orthopnea, PND, +swelling in lower extremities,  No anasarca, dizziness, palpitations, syncope.   GI  No heartburn, indigestion, abdominal pain, nausea, vomiting, diarrhea, change in bowel habits, loss of appetite, bloody stools.   Resp:   No wheezing.  No chest wall deformity  Skin: no rash or lesions.  GU: no dysuria, change in color of urine, no urgency or frequency.  No flank pain, no hematuria   MS:  No joint pain or swelling.  No decreased range of motion.  No back pain.    Physical Exam  BP (!) 144/82   Pulse 74   Ht 5' 5.5" (1.664 m)   Wt 222 lb 4 oz (100.8 kg)   SpO2 94%   BMI 36.42 kg/m   GEN: A/Ox3; pleasant , NAD obese    HEENT:  [place]/AT,  EACs-clear, TMs-wnl, NOSE-clear, THROAT-clear, no lesions, no postnasal drip or exudate noted.   NECK:  Supple w/ fair ROM; no JVD; normal carotid impulses w/o bruits; no thyromegaly or nodules palpated; no lymphadenopathy.    RESP  Clear  P & A; w/o, wheezes/ rales/ or rhonchi. no accessory muscle use, no dullness to  percussion  CARD:  RRR, no m/r/g, tr  peripheral edema, pulses intact, no cyanosis or clubbing.  GI:   Soft & nt; nml bowel sounds; no organomegaly or masses detected.   Rectum : perirectal area with large hyperpigmented confluent nodular lesions  Musco: Warm bil, no deformities or joint swelling noted.   Neuro: alert, no focal deficits noted.    Skin: Warm,  Perirectal lesions as above.    Lab Results:  CBC   BNP No results found for: BNP  ProBNP No results found for: PROBNP  Imaging: No results found.   Assessment & Plan:   Sarcoidosis ([place]) Possible Sarcoid flare with perirectal involvement .  Would like pt to return to Dermatology and surgery for further evaluation and treatment options  Will treat  with short term steroids for next 2-3 weeks .  Pt has hx of Anal cancer but says biopsies have been neg for cancer and positive for sarcoid .  Will tx w/ prednisone for now . May need plaquenil going forward if does not improve.    Upper airway cough syndrome Some imporovement with cough control and GERD control  ?sarcoid flare - tx w/ short term steroids .   Plan  Patient Instructions  Prednisone 40mg  daily for 5 days then 20mg  daily and hold at this dose.  Follow up with Dermatology and Surgery as discussed.  Follow med calendar closely and bring to each visit.  Please contact office for sooner follow up if symptoms do not improve or worsen or seek emergency care  Follow up   In 2-3 weeks and As needed         Rexene Edison, NP 08/19/2017

## 2017-08-19 NOTE — Assessment & Plan Note (Signed)
Possible Sarcoid flare with perirectal involvement .  Would like pt to return to Dermatology and surgery for further evaluation and treatment options  Will treat with short term steroids for next 2-3 weeks .  Pt has hx of Anal cancer but says biopsies have been neg for cancer and positive for sarcoid .  Will tx w/ prednisone for now . May need plaquenil going forward if does not improve.

## 2017-08-19 NOTE — Assessment & Plan Note (Signed)
Some imporovement with cough control and GERD control  ?sarcoid flare - tx w/ short term steroids .   Plan  Patient Instructions  Prednisone 40mg  daily for 5 days then 20mg  daily and hold at this dose.  Follow up with Dermatology and Surgery as discussed.  Follow med calendar closely and bring to each visit.  Please contact office for sooner follow up if symptoms do not improve or worsen or seek emergency care  Follow up   In 2-3 weeks and As needed

## 2017-08-19 NOTE — Telephone Encounter (Signed)
Will route to JJ as requested.

## 2017-08-19 NOTE — Patient Instructions (Signed)
Prednisone 40mg  daily for 5 days then 20mg  daily and hold at this dose.  Follow up with Dermatology and Surgery as discussed.  Follow med calendar closely and bring to each visit.  Please contact office for sooner follow up if symptoms do not improve or worsen or seek emergency care  Follow up   In 2-3 weeks and As needed

## 2017-08-20 NOTE — Telephone Encounter (Signed)
JJ can we close this phone message?  Please advise. thanks

## 2017-08-20 NOTE — Telephone Encounter (Addendum)
Referral placed to [facility] as okayed by TP Possible Sarcoid flare with perirectal involvement .  Would like pt to return to Dermatology and surgery for further evaluation and treatment options    Per the 9.4.18 office visit w/ TP for med calendar: Patient Instructions  Prednisone 40mg  daily for 5 days then 20mg  daily and hold at this dose.  Follow up with Dermatology and Surgery as discussed.  Follow med calendar closely and bring to each visit.  Please contact office for sooner follow up if symptoms do not improve or worsen or seek emergency care  Follow up   In 2-3 weeks and As needed    Left detailed message on named VM informing pt that referral has been placed Will sign off

## 2017-08-21 NOTE — Addendum Note (Signed)
Addended by: Della Goo C on: 08/21/2017 10:10 AM   Modules accepted: Orders

## 2017-09-09 ENCOUNTER — Encounter: Payer: Self-pay | Admitting: Adult Health

## 2017-09-09 ENCOUNTER — Ambulatory Visit (INDEPENDENT_AMBULATORY_CARE_PROVIDER_SITE_OTHER): Payer: PRIVATE HEALTH INSURANCE | Admitting: Adult Health

## 2017-09-09 DIAGNOSIS — D869 Sarcoidosis, unspecified: Secondary | ICD-10-CM | POA: Diagnosis not present

## 2017-09-09 NOTE — Patient Instructions (Addendum)
Continue on Prednisone 20mg  daily for 2 weeks then 10mg  daily for 2 weeks then 5mg  daily .  Follow up with surgeon next week as planned  Follow up with Dr. Melvyn Novas  Or Aanchal Cope NP in 6 weeks and As needed

## 2017-09-09 NOTE — Assessment & Plan Note (Signed)
Recent flare with increased skin lesions -perirectal lesions , tx w/ steroid challenge.  Minimal improvement . Will slow taper to 5mg  until seen back in 6 weeks Keep ov with Surgeon for evaluation as very uncomforable. For pt.   Plan  Patient Instructions  Continue on Prednisone 20mg  daily for 2 weeks then 10mg  daily for 2 weeks then 5mg  daily .  Follow up with surgeon next week as planned  Follow up with Dr. Melvyn Novas  Or Parrett NP in 6 weeks and As needed

## 2017-09-09 NOTE — Progress Notes (Signed)
@Patient  ID: [name], female    DOB: 10-17-1956, 61 y.o.   MRN: [id number]  Chief Complaint  Patient presents with  . Follow-up    Sarcoid     Referring provider: [name], [name], MD  HPI: 61 year old African American female with minimum smoking history followed for sarcoid Seen for initial pulmonary consult 2015 Anal  cancer 2005 , s/p chemo and XRT .   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 Last chronic prednisone 04/15/08 with no flare cough no nodules (right leg and left hand and right arm) 04/2017 ACE inhibitor d/c   TBBX pos ncg 01/22/08  - Chronic pred rx 2/09> 04/2008  - Restart Pred December 26, 2008 > June 12, 2009 stopped  > restarted 07/11/11>>pt stopped 10/15/11  - PFT's March 23, 2010 VC 1.77 (66%) no obst, ERV 34%, DLCO 53 > 115% corrected - PFT's 09/30/12       VC 2.02 (57%) ratio 78 , erv 13%  DLCO 55 > 118% corrected - Skin bx 05/19/14 bridge of nose > Pos NCG - 05/02/2015  Walked RA x 3 laps @ 185 ft each stopped due to end of study, no sob/ no desat at nl pace/ no cough provoked  - PFT's  08/22/2016 VC  1.81 ratio 82  with DLCO  59/55 % corrects to 88 % for alv volume  - Spirometry 05/07/2017   FVC  1.81  With no obst by fev1/fvc and no significant curvature  - 05/07/2017  After extensive coaching HFA effectiveness =    75% from a baseline of < 25%  - 05/22/2017   Walked RA x one lap @ 185 stopped due to  Sob at fast pace with sats 96%     09/09/2017 Follow up ; Sarcoid (Pulmonary /ocular/skin involvement ) and chronic cough .  Patient presents for a two-week follow-up. She has known sarcoidosis with pulmonary, ocular and skin involvement. She also has been dealing with significant perirectal lesions from the last several months. Previous skin biopsies of them positive for sarcoid  (neg for malignancy as  hx of anal cancer ) . She was referred back to surgery. Has appointment on Oct 1.  She is was treated with steroid challenge. currentlly on prednisone 20mg  daily . Feels rectal lesions might be sligthly better but not much .  Says overall her breathing is doing okay with no flare of cough She does get winded with some exercise. She is going to [facility].  delcines flu shot .    Allergies  Allergen Reactions  . Shellfish Allergy Itching and Swelling  . Penicillins Hives and Itching    Immunization History  Administered Date(s) Administered  . Influenza Split 10/01/2011, 09/23/2012  . Influenza Whole 09/15/2010  . Tdap 07/06/2013    Past Medical History:  Diagnosis Date  . Bifid uvula   . GERD (gastroesophageal reflux disease)   . History of cervical dysplasia   . History of chemotherapy    06/ 2005  . History of cleft lip    CORRECTED  . History of condyloma acuminatum    PERIANAL  . History of radiation therapy    completed 06/ 2005  . 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 History of vulvar dysplasia   . Hypertension   . 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 Pulmonary nodule, left   . 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 Sarcoid uveitis of left eye goes to [facility]--  currently no issues per pt 11-27-2016  . Upper airway cough syndrome   . Wears glasses     Tobacco History: History  Smoking Status  . Former Smoker  . Packs/day: 0.50  . Years: 1.00  . Types: Cigarettes  . Quit date: 12/16/1993  Smokeless Tobacco  . Never Used   Counseling given: Not Answered   Outpatient Encounter Prescriptions as of 09/09/2017  Medication Sig  . albuterol (PROVENTIL HFA;VENTOLIN HFA) 108 (90 Base) MCG/ACT inhaler Inhale 2 puffs into the lungs every 4 (four) hours as needed for wheezing or shortness of breath.  Marland Kitchen amLODipine  (NORVASC) 10 MG tablet Take 1 tablet (10 mg total) by mouth daily.  . bisoprolol (ZEBETA) 10 MG tablet Take 1 tablet (10 mg total) by mouth daily. (Patient taking differently: Take 10 mg by mouth daily with breakfast. )  . clonazePAM (KLONOPIN) 1 MG tablet Take 1 mg by mouth daily as needed for anxiety.  . Dextromethorphan-Menthol (DELSYM COUGH RELIEF MT) Use as directed 2 mL/oz in the mouth or throat.  . famotidine (PEPCID) 20 MG tablet Take 1 tablet (20 mg total) by mouth at bedtime.  . hydrochlorothiazide (HYDRODIURIL) 25 MG tablet Take 1 tablet (25 mg total) by mouth every morning.  . lidocaine-hydrocortisone (ANAMANTEL HC) 3-0.5 % CREA Place 1 Applicatorful rectally as needed.  . mometasone (ELOCON) 0.1 % cream Apply 1 application topically as needed.   . pantoprazole (PROTONIX) 40 MG tablet Take 30-60 min before first meal of the day  . predniSONE (DELTASONE) 10 MG tablet 4 tabs daily for 5 days, then 2 tabs daily and hold at this dose.  . simvastatin (ZOCOR) 20 MG tablet Take 20 mg by mouth daily.  . traMADol (ULTRAM) 50 MG tablet 1-2 every 4 hours as needed for cough or pain   No facility-administered encounter medications on file as of 09/09/2017.      Review of Systems  Constitutional:   No  weight loss, night sweats,  Fevers, chills, fatigue, or  lassitude.  HEENT:   No headaches,  Difficulty swallowing,  Tooth/dental problems, or  Sore throat,                No sneezing, itching, ear ache, nasal congestion, post nasal drip,   CV:  No chest pain,  Orthopnea, PND, swelling in lower extremities, anasarca, dizziness, palpitations, syncope.   GI  No heartburn, indigestion, abdominal pain, nausea, vomiting, diarrhea, change in bowel habits, loss of appetite, bloody stools.   Resp:   No excess mucus, no productive cough,  No non-productive cough,  No coughing up of blood.  No change in color of mucus.  No wheezing.  No chest wall deformity  Skin: +perirectal lesions   GU: no  dysuria, change in color of urine, no urgency or frequency.  No flank pain, no hematuria   MS:  No joint pain or swelling.  No decreased range of motion.  No back pain.    Physical Exam  BP 126/84 (BP Location: Left Arm, Cuff Size: Normal)   Pulse 67   Ht 5' 5.5" (1.664 m)   Wt 222 lb 6.4 oz (100.9 kg)   SpO2 97%   BMI 36.45 kg/m   GEN: A/Ox3; pleasant , NAD, obese  HEENT:  [place]/AT,  EACs-clear, TMs-wnl, NOSE-clear, THROAT-clear, no lesions, no postnasal drip or exudate noted. Poor dentition   NECK:  Supple w/ fair ROM; no JVD; normal carotid impulses w/o bruits; no thyromegaly or nodules palpated; no lymphadenopathy.    RESP  Clear  P & A; w/o, wheezes/ rales/ or rhonchi. no accessory muscle use, no dullness to percussion  CARD:  RRR, no m/r/g, no peripheral edema, pulses intact, no cyanosis or clubbing.  GI:   Soft & nt; nml bowel sounds; no organomegaly or masses detected.   Musco: Warm bil, no deformities or joint swelling noted.   Neuro: alert, no focal deficits noted.    Skin: Warm, no lesions or rashes    Lab Results:  CBC  BMET   BNP No results found for: BNP  ProBNP No results found for: PROBNP  Imaging: No results found.   Assessment & Plan:   Sarcoidosis ([place]) Recent flare with increased skin lesions -perirectal lesions , tx w/ steroid challenge.  Minimal improvement . Will slow taper to 5mg  until seen back in 6 weeks Keep ov with Surgeon for evaluation as very uncomforable. For pt.   Plan  Patient Instructions  Continue on Prednisone 20mg  daily for 2 weeks then 10mg  daily for 2 weeks then 5mg  daily .  Follow up with surgeon next week as planned  Follow up with Dr. Melvyn Novas  Or Rochel Privett NP in 6 weeks and As needed         Rexene Edison, NP 09/09/2017

## 2017-09-09 NOTE — Progress Notes (Signed)
Chart and office note reviewed in detail  > agree with a/p as outlined    

## 2017-09-15 ENCOUNTER — Other Ambulatory Visit: Payer: Self-pay | Admitting: General Surgery

## 2017-09-15 NOTE — H&P (Signed)
History of Present Illness Leighton Ruff MD; 29/08/3715 11:46 AM) The patient is a 61 year old female who presents with anal lesions. She is referred for evaluation of a perianal lesion. She states that she has had this for several years. It is causing irritation. She reports daily bowel movements. She denies any pain with defecation. 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 She received chemotherapy and radiation.  In looking at pathology reports from 2005 she had a biopsy of the suprapubic region which came back as condyloma with mild dysplasia, VIN 1. She also had biopsies of her left and right labia which also showed VIN 1. She had a biopsy of the right posterior anus which showed squamous mucosa with mild dysplasia, AIN 1 of 3. Biopsies of the posterior anus and left lateral perianal area also came back with the same result. This was from April 2005. Another biopsy report from November 2005 of the left posterior anus, right posterior anus and anterior anus which showed squamous hyperplasia with extensive hyperkeratosis. In the comment section on the pathology report states there is a history of squamous cell carcinoma of the anus had been noted.   Problem List/Past Medical Leighton Ruff, MD; 96/06/8937 11:54 AM) ANAL LESION (K62.9) ANGIOKERATOMA (D23.9)  Past Surgical History Leighton Ruff, MD; 09/15/7509 11:54 AM) Breast Biopsy Right. Cesarean Section - 1 Foot Surgery Bilateral. Gallbladder Surgery - Open Oral Surgery  Diagnostic Studies History Leighton Ruff, MD; 25/07/5276 11:54 AM) Colonoscopy 5-10 years ago Mammogram within last year Pap Smear 1-5 years ago  Allergies Malachy Moan, RMA; 09/15/2017 11:26 AM) Penicillin G Sodium *PENICILLINS*  Medication History Malachy Moan, RMA; 09/15/2017 11:31 AM) AmLODIPine Besylate (10MG  Tablet, Oral) Active. PredniSONE (10MG  Tablet, Oral)  Active. Lidocaine-Hydrocortisone Ace (Rectal) Specific strength unknown - Active. Simvastatin (20MG  Tablet, Oral) Active. Pantoprazole Sodium (20MG  Tablet DR, Oral) Active. TraMADol HCl (Oral) Specific strength unknown - Active. Mometasone Furoate (0.1% Cream, External) Active. Bisoprolol Fumarate (10MG  Tablet, Oral) Active. Hydrochlorothiazide (25MG  Tablet, Oral) Active. ValACYclovir HCl (500MG  Tablet, Oral) Active. Albuterol (90MCG/ACT Aerosol Soln, Inhalation) Active. Aciphex (20MG  Tablet DR, Oral) Active. Medications Reconciled  Social History Leighton Ruff, MD; 82/03/2352 11:54 AM) Alcohol use Occasional alcohol use. Caffeine use Tea. Tobacco use Former smoker.  Family History Leighton Ruff, MD; 61/03/4314 11:54 AM) Arthritis Mother. Family history unknown First Degree Relatives Heart Disease Mother. Heart disease in female family member before age 16 Hypertension Mother. Respiratory Condition Mother.  Pregnancy / Birth History Leighton Ruff, MD; 40/0/8676 11:54 AM) Age at menarche 82 years. Maternal age 21-30 Para 2  Other Problems Leighton Ruff, MD; 19/04/931 11:54 AM) High blood pressure Rectal Cancer     Review of Systems Leighton Ruff MD; 67/12/2456 11:54 AM) Skin Present- Rash. Not Present- Change in Wart/Mole, Dryness, Hives, Jaundice, New Lesions, Non-Healing Wounds and Ulcer. HEENT Not Present- Earache, Hearing Loss, Hoarseness, Nose Bleed, Oral Ulcers, Ringing in the Ears, Seasonal Allergies, Sinus Pain, Sore Throat, Visual Disturbances, Wears glasses/contact lenses and Yellow Eyes. Respiratory Not Present- Bloody sputum, Chronic Cough, Difficulty Breathing, Snoring and Wheezing. Breast Not Present- Breast Mass, Breast Pain, Nipple Discharge and Skin Changes. Cardiovascular Not Present- Chest Pain, Difficulty Breathing Lying Down, Leg Cramps, Palpitations, Rapid Heart Rate, Shortness of Breath and Swelling of  Extremities. Gastrointestinal Present- Rectal Pain. Not Present- Abdominal Pain, Bloating, Bloody Stool, Change in Bowel Habits, Chronic diarrhea, Constipation, Difficulty Swallowing, Excessive gas, Gets full quickly at meals, Hemorrhoids, Indigestion, Nausea and Vomiting. Female  Genitourinary Not Present- Frequency, Nocturia, Painful Urination, Pelvic Pain and Urgency. Musculoskeletal Not Present- Back Pain, Joint Pain, Joint Stiffness, Muscle Pain, Muscle Weakness and Swelling of Extremities. Neurological Not Present- Decreased Memory, Fainting, Headaches, Numbness, Seizures, Tingling, Tremor, Trouble walking and Weakness. Psychiatric Not Present- Anxiety, Bipolar, Change in Sleep Pattern, Depression, Fearful and Frequent crying. Endocrine Not Present- Cold Intolerance, Excessive Hunger, Hair Changes, Heat Intolerance, Hot flashes and New Diabetes. Hematology Not Present- Easy Bruising, Excessive bleeding, Gland problems, HIV and Persistent Infections.  Vitals Malachy Moan RMA; 09/15/2017 11:32 AM) 09/15/2017 11:31 AM Weight: 223.6 lb Height: 65.5in Body Surface Area: 2.09 m Body Mass Index: 36.64 kg/m  Temp.: 98.68F  Pulse: 81 (Regular)  BP: 160/100 (Sitting, Left Arm, Standard)      Physical Exam Leighton Ruff MD; 17/06/1164 11:54 AM)  General Mental Status-Alert. General Appearance-Consistent with stated age. Hydration-Well hydrated. Voice-Normal. Note: obese  Head and Neck Head-normocephalic, atraumatic with no lesions or palpable masses. Trachea-midline.  Eye Eyeball - Bilateral-Extraocular movements intact. Sclera/Conjunctiva - Bilateral-No scleral icterus.  Cardiovascular Cardiovascular examination reveals -normal heart sounds, regular rate and rhythm with no murmurs and normal pedal pulses bilaterally.  Rectal Note: There are chronic changes and skin pigmentation around the perianal area. Remote from the anus more toward the  gluteal cleft and extending on both the left and right sides but more prominently on the left side there are raised verrucous lesions that are very darkly pigmented. The is a pedunculated lesion on the left gluteus.  Peripheral Vascular Upper Extremity Palpation - Pulses bilaterally normal.  Neurologic Neurologic evaluation reveals -alert and oriented x 3 with no impairment of recent or remote memory. Mental Status-Normal.  Neuropsychiatric The patient's mood and affect are described as -normal. Judgment and Insight-insight is appropriate concerning matters relevant to self.  Lymphatic Head & Neck  General Head & Neck Lymphatics: Bilateral - Description - Normal. Axillary - Did not examine. Femoral & Inguinal - Did not examine.    Assessment & Plan Leighton Ruff MD; 79/0/3833 11:56 AM)  ANAL LESION (K62.9) Impression: 61 year old female with sarcoidosis and perianal lesion. Biopsy shows sarcoid changes. This lesion carpets her posterior perianal region. There was one pedunculated lesion on the left gluteus. This is the one that gives her most problems. I would like to resect this area and see how well she heals. We can discuss whether further treatment is warranted in the future depending on how she does with resecting the most symptomatic portion of her perianal issues. We discussed the risk of bleeding infection and nonhealing wound. I believe she understands this and agrees to proceed

## 2017-09-23 ENCOUNTER — Other Ambulatory Visit: Payer: Self-pay | Admitting: General Surgery

## 2017-09-24 ENCOUNTER — Ambulatory Visit: Payer: PRIVATE HEALTH INSURANCE | Admitting: Family Medicine

## 2017-10-21 ENCOUNTER — Encounter: Payer: Self-pay | Admitting: Internal Medicine

## 2017-10-21 ENCOUNTER — Ambulatory Visit (INDEPENDENT_AMBULATORY_CARE_PROVIDER_SITE_OTHER): Payer: PRIVATE HEALTH INSURANCE | Admitting: Internal Medicine

## 2017-10-21 VITALS — BP 148/80 | HR 76 | Ht 65.5 in | Wt 224.0 lb

## 2017-10-21 DIAGNOSIS — D869 Sarcoidosis, unspecified: Secondary | ICD-10-CM

## 2017-10-21 DIAGNOSIS — R058 Other specified cough: Secondary | ICD-10-CM

## 2017-10-21 DIAGNOSIS — R05 Cough: Secondary | ICD-10-CM

## 2017-10-21 MED ORDER — PREDNISONE 5 MG (21) PO TBPK: ORAL_TABLET | ORAL | 2 refills | Status: DC

## 2017-10-21 NOTE — Progress Notes (Signed)
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  Last chronic prednisone 04/15/08 with no flare cough no nodules (right leg and left hand and right arm)   History of Present Illness  Last ov 02/17/13 no evidence active sarcoid rec Aciphex 20 mg Take 30-60 min before first meal of the day also when coughing for any reason take Pepcid 20 mg one at bedtime  Take delsym two tsp every 12 hours and supplement if needed with  tramadol 50 mg up to 2 every 4 hours to suppress the urge to cough. Swallowing water or using ice chips/non mint and menthol containing candies (such as lifesavers or sugarless jolly ranchers) are also effective.  You should rest your voice and avoid activities that you know make you cough. Once you have eliminated the cough for 3 straight days try reducing the tramadol first,  then the delsym as tolerated.   GERD diet    01/28/2014  Pulmonary consultation / Wert/ on no maint resp rx / rare baseline need for saba  Chief Complaint  Patient presents with  . Pulmonary Consult    Referred per Dr. Posey Pronto. [name] Cough is prod with moderate yellow sputum. She is using proair approx tid.  Onset was Jan 01 2014 while on aciphex with bfast with flu like illness but not follow contingency rx  On prednisone taper/ sp tamiflu/cough meds  Not limited by breathing from desired activities   rec Aciphex 20 mg Take 30-60 min before first meal of the day also when coughing for any reason take  Pepcid 20 mg one at bedtime and chlortrimeton 4 mg at bedtime Take delsym two tsp every 12 hours and supplement if needed with  tramadol 50 mg up to 2 every 4 hours to suppress the urge to cough.   Once you have eliminated the cough for 3 straight days try reducing the tramadol first,  then the delsym as tolerated.   GERD diet  F/u prn    08/22/2016  f/u ov/Wert re: cough mostly daytime non prod x May 2017 / not taking gerd rx as rec  Chief Complaint  Patient presents with  . Follow-up    Cough is unchanged. No new co's today.  Not limited by breathing from desired activities walks a mile 3 x weekly s stopping  rec Try not to clear your throat  - keep sugarless candy handy but no mint menthol or chocolate  Resume gerd rx > resolved    05/07/2017 acute extended ov/Wert re: cough > sob  Chief Complaint  Patient presents with  . Acute Visit    Cough not improving, non prod "feels like something in my throat". She is waking up in the night coughing.   was doing fine for months since last ov with me but not doing well as well since 02/18/17 when placed on lisinopril and gradually  downhill since on multple courses of prednisone and now symbicort (though very poor hfa) and much worse dry cough 24/7 no longer has  med calendar and doesn't even recognize the copy we have / very confused with meds  rec Stop lotensin and start valasartan 325 mg one daily  Start amlodipine 10 mg daily  Work on inhaler technique:  albuterol as a rescue medication to be used if you can't catch your breath For cough use the flutter valve and Take delsym two tsp every 12 hours and supplement if needed with  tramadol 50 mg up to 2 every 4 hours to suppress the urge to cough. Swallowing water or using ice chips/non mint and menthol containing candies (such as lifesavers or sugarless jolly ranchers) are also effective.  You should rest your voice and avoid activities that you know make you cough. Once you have eliminated the  cough for 3 straight days try reducing the tramadol first,  then the delsym as tolerated.  Finish your prednisone Follow the med calendar daily and bring it back with you to all office visits      05/22/2017  f/u ov/Wert re: cough > sob off acei x 2 weeks some better  Chief Complaint  Patient presents with  . Follow-up    Here to go over medication   doe = MMRC2 = can't walk a nl pace on a flat grade s sob but does fine slow and flat   Cough mostly daytime / dry/ never took tramadol correctly rec Take delsym two tsp every 12 hours and supplement if needed with  tramadol 50 mg up to 2 every 4 hours to suppress the urge to cough  Prednisone 10 mg x 2  Each am x 3 days then 1 daily x 3 days and stop.      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 Follow-up    Cough and SOB are some better, but not back to her normal baseline. Her cough is non prod. She also notices some wheezing.   rarely if ever uses saba at hs/ mostly daytime need of over doe it with exertion / no following recs for gerd/ no longer on ppi or h2 hs - much less need for saba than prior to stopping acei  Did not see [name] for med calendar as req / did not take am meds yet  rec Pantoprazole (protonix) 40 mg   Take  30-60 min before first meal of the day and Pepcid (famotidine)  20 mg one @  bedtime until return to office - this is the best way to tell whether stomach acid is contributing to your problem.   GERD diet   09/09/17 NP ov rec Continue on Prednisone 20mg  daily for 2 weeks then 10mg  daily for 2 weeks then 5mg  daily .   surgery 09/23/17 [name] / [place]:  - ANGIOKERATOMA.  10/21/2017  f/u ov/Wert re:  Sarcoid/ steroid dep / ocular involvmemnt  Chief Complaint  Patient presents with  . Follow-up    Increased cough x 2 wks- non prod. Her breathing is unchanged. She ran out of prednisone approx 2 wks ago and never had it  refilled.   confused re details of care  ? Flare of ocular sarcoid off pred > Bowing otc eyedrops for wetting and no further pred eyedrops Breathing worse = doe since cough since about a week or two p stopped pred though instructions say to taper to 5 mg  Not using med calendar/ not updated since 2016  No obvious day to day or daytime variability or assoc excess/ purulent sputum or mucus plugs or hemoptysis or cp or chest tightness, subjective wheeze or overt sinus or hb symptoms. No unusual exp hx or h/o childhood pna/ asthma or knowledge of premature birth.  Sleeping ok flat without nocturnal  or early am exacerbation  of respiratory  c/o's or need for noct saba. Also denies any obvious fluctuation of symptoms with weather or environmental changes or other aggravating or alleviating factors except as outlined above   Current Allergies, Complete Past Medical History, Past Surgical History, Family History, and Social History were reviewed in [employer] record.  ROS  The following are not active complaints unless bolded Hoarseness, sore throat, dysphagia, dental problems, itching, sneezing,  nasal congestion better on sudafed or discharge of excess mucus or purulent secretions, ear ache,   fever, chills, sweats, unintended wt loss or wt gain, classically pleuritic or exertional cp,  orthopnea pnd or leg swelling, presyncope, palpitations, abdominal pain, anorexia, nausea, vomiting, diarrhea  or change in bowel habits or change in bladder habits, change in stools or change in urine, dysuria, hematuria,  rash, arthralgias, visual complaints, headache, numbness, weakness or ataxia or problems with walking or coordination,  change in mood/affect or memory.        Current Meds  Medication Sig  . albuterol (PROVENTIL HFA;VENTOLIN HFA) 108 (90 Base) MCG/ACT inhaler Inhale 2 puffs into the lungs every 4 (four) hours as needed for wheezing or shortness of breath.  Marland Kitchen amLODipine  (NORVASC) 10 MG tablet Take 1 tablet (10 mg total) by mouth daily.  . bisoprolol (ZEBETA) 10 MG tablet Take 1 tablet (10 mg total) by mouth daily. (Patient taking differently: Take 10 mg by mouth daily with breakfast. )  . clonazePAM (KLONOPIN) 1 MG tablet Take 1 mg by mouth daily as needed for anxiety.  . Dextromethorphan-Menthol (DELSYM COUGH RELIEF MT) Use as directed 2 mL/oz in the mouth or throat.  . famotidine (PEPCID) 20 MG tablet Take 1 tablet (20 mg total) by mouth at bedtime.  . hydrochlorothiazide (HYDRODIURIL) 25 MG tablet Take 1 tablet (25 mg total) by mouth every morning.  . lidocaine-hydrocortisone (ANAMANTEL HC) 3-0.5 % CREA Place 1 Applicatorful rectally as needed.  . mometasone (ELOCON) 0.1 % cream Apply 1 application topically as needed.   . pantoprazole (PROTONIX) 40 MG tablet Take 30-60 min before first meal of the day  . phenylephrine (SUDAFED PE) 10 MG TABS tablet Take 10 mg every 4 (four) hours as needed by mouth.  . traMADol (ULTRAM) 50 MG tablet 1-2 every 4 hours as needed for cough or pain  . [DISCONTINUED] predniSONE (DELTASONE) 10 MG tablet 4 tabs daily for 5 days, then 2 tabs daily and hold at this dose.             Objective:   Physical Exam    amb obese  bf nad   10/21/2017      224  07/07/2017      223  05/22/2017         219  05/07/2017       222  08/22/2016         216   05/17/2016        215   05/02/15 219 lb (99.338 kg)  04/07/15 219 lb 9.6 oz (99.61 kg)  03/24/15 216 lb (97.977 kg)    Vital signs reviewed -  - Note on arrival 02 sats  96% on RA  HEENT: nl dentition, turbinates bilaterally, and oropharynx. Nl external ear canals without cough reflex   NECK :  without JVD/Nodes/TM/ nl carotid upstrokes bilaterally   LUNGS: no acc muscle use,  Nl contour chest which is clear to A and P bilaterally without cough on insp or exp maneuvers   CV:  RRR  no s3 or murmur or increase in P2, and no edema   ABD:  Obese/ soft and nontender with nl  inspiratory excursion in the supine position. No bruits or organomegaly appreciated, bowel sounds nl  MS:  Nl gait/ ext warm without deformities, calf tenderness, cyanosis or clubbing No obvious joint restrictions   SKIN: warm and dry without lesions    NEURO:  alert, approp, nl sensorium with  no motor or cerebellar deficits apparent.                 Assessment & Plan:

## 2017-10-21 NOTE — Patient Instructions (Addendum)
Prednisione 5 mg daily    See [name] in  4 weeks with all your medications, even over the counter meds, separated in two separate bags, the ones you take no matter what vs the ones you stop once you feel better and take only as needed when you feel you need them.   [name]  will generate for you a new user friendly medication calendar that will put [place] all on the same page re: your medication use.     Without this process, it simply isn't possible to assure that we are providing  your outpatient care  with  the attention to detail we feel you deserve.   If we cannot assure that you're getting that kind of care,  then we cannot manage your problem effectively from this clinic.  Once you have seen [name] and we are sure that we're all on the same page with your medication use she will arrange follow up with me.

## 2017-10-22 ENCOUNTER — Encounter: Payer: Self-pay | Admitting: Internal Medicine

## 2017-10-22 NOTE — Assessment & Plan Note (Addendum)
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/02/2015 >>> - flare May 2017 p stopped aciphex > rec restart 05/17/2016  - flare since 02/2017 placed on lisinopril with no airflow obst 05/07/2017 by spirometry > d/c ACEi  Permanently  - improved 07/07/2017 but not on gerd rx > rec resume full gerd rx/ stay off acei   - 10/21/2017 flared off pred x 2 weeks > resume @ 5 mg daily pending medication reconciliation to be sure she's following all the other instructions re management of uacs including gerd rx

## 2017-10-22 NOTE — Assessment & Plan Note (Signed)
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  - Chronic pred rx 2/09> 04/2008  - Restart Pred December 26, 2008 > June 12, 2009 stopped  > restarted 07/11/11>>pt stopped 10/15/11  - PFT's March 23, 2010 VC 1.77 (66%) no obst, ERV 34%, DLCO 53 > 115% corrected - PFT's 09/30/12       VC 2.02 (57%) ratio 78 , erv 13%  DLCO 55 > 118% corrected - Skin bx 05/19/14 bridge of nose > Pos NCG - 05/02/2015  Walked RA x 3 laps @ 185 ft each stopped due to end of study, no sob/ no desat at nl pace/ no cough provoked  - PFT's  08/22/2016 VC  1.81 ratio 82  with DLCO  59/55 % corrects to 88 % for alv volume  - Spirometry 05/07/2017   FVC  1.81  With no obst by fev1/fvc and no significant curvature  - 05/07/2017  After extensive coaching HFA effectiveness =    75% from a baseline of < 25%  - 05/22/2017   Walked RA x one lap @ 185 stopped due to  Sob at fast pace with sats 96% - 09/09/17 placed on floor dose of 5 mg daily and flared late Oct 2018 when ran out of rx   She apparently has ocular involvement per opth and worse cough/ sob off prednisone and very easily confused re details of care.  The goal with a chronic steroid dependent illness is always arriving at the lowest effective dose that controls the disease/symptoms and not accepting a set "formula" which is based on statistics or guidelines that don't always take into account patient  variability or the natural hx of the dz in every individual patient, which may well vary over time.  For now therefore I recommend the patient maintain  A floor of 5 mg daily until she returns for full med reconciliation/ use of trust but verify approach and return with all meds in hand using a trust but verify approach to confirm accurate Medication  Reconciliation The principal here is that until we are certain that the  patients are doing what we've asked, it makes no sense to ask them to do more ie challenge with low dose laba/ics to see if helps get off systemic steroids      I had an extended discussion  with the patient reviewing all relevant studies completed to date and  lasting 15 to 20 minutes of a 25 minute visit    Each maintenance medication was reviewed in detail including most importantly the difference between maintenance and prns and under what circumstances the prns are to be triggered using an action plan format that is not reflected in the computer generated alphabetically organized AVS but trather by a new customized med calendar that reflects the AVS meds with confirmed 100% correlation.   In addition, Please see AVS for unique instructions that I personally wrote and verbalized to the the pt in detail and then reviewed with pt  by my nurse highlighting any  changes in therapy recommended at today's visit to their plan of care.

## 2017-10-22 NOTE — Assessment & Plan Note (Signed)
Body mass index is 36.71 kg/m.  -  trending up  Lab Results  Component Value Date   TSH 3.63 02/21/2017     Contributing to gerd risk/ doe/reviewed the need and the process to achieve and maintain neg calorie balance > defer f/u primary care including intermittently monitoring thyroid status

## 2017-11-21 ENCOUNTER — Encounter: Payer: PRIVATE HEALTH INSURANCE | Admitting: Adult Health

## 2017-11-26 ENCOUNTER — Encounter: Payer: PRIVATE HEALTH INSURANCE | Admitting: Adult Health

## 2017-12-31 ENCOUNTER — Ambulatory Visit (INDEPENDENT_AMBULATORY_CARE_PROVIDER_SITE_OTHER): Payer: PRIVATE HEALTH INSURANCE | Admitting: Family Medicine

## 2017-12-31 ENCOUNTER — Encounter: Payer: Self-pay | Admitting: Family Medicine

## 2017-12-31 VITALS — BP 150/88 | HR 83 | Temp 98.3°F | Resp 16 | Ht 65.5 in | Wt 224.2 lb

## 2017-12-31 DIAGNOSIS — I10 Essential (primary) hypertension: Secondary | ICD-10-CM | POA: Diagnosis not present

## 2017-12-31 DIAGNOSIS — R21 Rash and other nonspecific skin eruption: Secondary | ICD-10-CM

## 2017-12-31 MED ORDER — TRIAMCINOLONE 0.1 % CREAM:EUCERIN CREAM 1:1: 1.0000 "application " | TOPICAL_CREAM | Freq: Every day | CUTANEOUS | 0 refills | Status: DC | PRN

## 2017-12-31 NOTE — Progress Notes (Signed)
ACUTE VISIT   HPI:  Chief Complaint  Patient presents with  . Medication Refill    need refill on cream for wart on bottom    [name] is a 62 y.o. female, who is here today complaining of persistent skin rash and lesions on lower back and gluteus.  Problem is chronic, condyloma. She recently had main lesion removed by surgeon but still having intense pruritus and intermittent bleeding. Problem is exacerbated by scratching. She keeps lesion cover with cotton or gauze preventing sanguinolent drainage from staining her underwear.  She followed last with surgeon 4 days ago. She feels like pruritus is getting worse. Lesion is painful,exacerbated by palpation and defecation.  She has Hx of loose stools, which seems to aggravate problem. Hx of rectal cancer, s/p resection and chemoradiation.   She has applied topical steroids intermittently for years but according to pt, she was discouraged by surgeon to do so. She has also used Eucerin mixed with steroid and this one was approved by surgeon,so she needs a refill.  Hx of sarcoidosis,she is on chronic oral Prednisone and follows with pulmonologist.   HTN: Chronic problem. BP elevated today. Last F/U in 07/2017.  Last eye exam within the past year.  Denies headache, visual changes, chest pain, dyspnea, palpitation, claudication, focal weakness, or edema. Currently on HCTZ 25 mg daily and Amlodipine 10 mg daily.  Lab Results  Component Value Date   CREATININE 0.78 06/20/2017   BUN 15 06/20/2017   NA 139 06/20/2017   K 4.1 06/20/2017   CL 101 06/20/2017   CO2 32 06/20/2017   BP yesterday elevated,she does not recall numbers.  She is on Sudafed, which she takes daily.  She has not been consistent with a healthy diet and not exercising regularly.   Review of Systems  Constitutional: Negative for activity change, appetite change, fatigue, fever and unexpected weight change.  HENT: Negative for mouth sores,  nosebleeds and trouble swallowing.   Eyes: Negative for redness and visual disturbance.  Respiratory: Negative for shortness of breath and wheezing.   Cardiovascular: Negative for chest pain, palpitations and leg swelling.  Gastrointestinal: Negative for abdominal pain, nausea and vomiting.       Negative for changes in bowel habits.  Endocrine: Negative for cold intolerance, heat intolerance, polydipsia, polyphagia and polyuria.  Genitourinary: Negative for decreased urine volume and hematuria.  Musculoskeletal: Negative for gait problem and myalgias.  Skin: Positive for rash and wound.  Allergic/Immunologic: Positive for environmental allergies.  Neurological: Negative for syncope, weakness and headaches.  Psychiatric/Behavioral: Negative for confusion. The patient is nervous/anxious.       Current Outpatient Medications on File Prior to Visit  Medication Sig Dispense Refill  . albuterol (PROVENTIL HFA;VENTOLIN HFA) 108 (90 Base) MCG/ACT inhaler Inhale 2 puffs into the lungs every 4 (four) hours as needed for wheezing or shortness of breath. 1 Inhaler 5  . amLODipine (NORVASC) 10 MG tablet Take 1 tablet (10 mg total) by mouth daily. 30 tablet 11  . bisoprolol (ZEBETA) 10 MG tablet Take 1 tablet (10 mg total) by mouth daily. (Patient taking differently: Take 10 mg by mouth daily with breakfast. ) 30 tablet 5  . clonazePAM (KLONOPIN) 1 MG tablet Take 1 mg by mouth daily as needed for anxiety.    . Dextromethorphan-Menthol (DELSYM COUGH RELIEF MT) Use as directed 2 mL/oz in the mouth or throat.    . famotidine (PEPCID) 20 MG tablet Take 1 tablet (20 mg total)  by mouth at bedtime. 30 tablet 11  . hydrochlorothiazide (HYDRODIURIL) 25 MG tablet Take 1 tablet (25 mg total) by mouth every morning. 90 tablet 1  . lidocaine-hydrocortisone (ANAMANTEL HC) 3-0.5 % CREA Place 1 Applicatorful rectally as needed.    . pantoprazole (PROTONIX) 40 MG tablet Take 30-60 min before first meal of the day 30  tablet 11  . phenylephrine (SUDAFED PE) 10 MG TABS tablet Take 10 mg every 4 (four) hours as needed by mouth.    . predniSONE (STERAPRED UNI-PAK 21 TAB) 5 MG (21) TBPK tablet One with breakfast daily 30 tablet 2  . traMADol (ULTRAM) 50 MG tablet 1-2 every 4 hours as needed for cough or pain 40 tablet 0   No current facility-administered medications on file prior to visit.      Past Medical History:  Diagnosis Date  . Bifid uvula   . GERD (gastroesophageal reflux disease)   . History of cervical dysplasia   . History of chemotherapy    06/ 2005  . History of cleft lip    CORRECTED  . History of condyloma acuminatum    PERIANAL  . History of radiation therapy    completed 06/ 2005  . 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 History of vulvar dysplasia   . Hypertension   . 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 Pulmonary nodule, left   . 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 Sarcoid uveitis of left eye goes to [facility]--  currently no issues per pt 11-27-2016  . Upper airway cough syndrome   . Wears glasses    Allergies  Allergen Reactions  . Shellfish Allergy Itching and Swelling  . Penicillins Hives and Itching    Social History   Socioeconomic History  . Marital status: Single    Spouse name: None  . Number of children: None  . Years of education: None  . Highest education level: None  Social Needs  . Financial resource strain: None  . Food insecurity - worry: None  . Food insecurity - inability: None  . Transportation needs - medical: None  . Transportation needs - non-medical: None  Occupational History  . Occupation: Tech 1    Employer: [employer]  Tobacco Use  . Smoking status: Former Smoker    Packs/day: 0.50    Years: 1.00    Pack years: 0.50    Types: Cigarettes    Last attempt to quit: 12/16/1993    Years  since quitting: 24.0  . Smokeless tobacco: Never Used  Substance and Sexual Activity  . Alcohol use: Yes    Comment: occasional  . Drug use: No  . Sexual activity: No  Other Topics Concern  . None  Social History Narrative  . None    Vitals:   12/31/17 1012  BP: (!) 150/88  Pulse: 83  Resp: 16  Temp: 98.3 F (36.8 C)  SpO2: 96%   Body mass index is 36.75 kg/m.  Wt Readings from Last 3 Encounters:  12/31/17 224 lb 4 oz (101.7 kg)  10/21/17 224 lb (101.6 kg)  09/09/17 222 lb 6.4 oz (100.9 kg)     Physical Exam  Nursing note and vitals reviewed. Constitutional: She is oriented to person, place, and time. She appears well-developed. No distress.  HENT:  Head: Normocephalic and atraumatic.  Mouth/Throat:  Oropharynx is clear and moist and mucous membranes are normal.  Eyes: Conjunctivae are normal. Pupils are equal, round, and reactive to light.  Neck: No thyroid mass present.  Cardiovascular: Normal rate and regular rhythm.  No murmur heard. Pulses:      Dorsalis pedis pulses are 2+ on the right side, and 2+ on the left side.  Respiratory: Effort normal and breath sounds normal. No respiratory distress.  GI: Soft. She exhibits no mass. There is no hepatomegaly. There is no tenderness.  Musculoskeletal: She exhibits edema (2+ LLE and trace pitting edema RLE). She exhibits no tenderness.  Lymphadenopathy:    She has no cervical adenopathy.  Neurological: She is alert and oriented to person, place, and time. She has normal strength. Gait normal.  Skin: Skin is warm. Rash noted. Rash is not vesicular. No erythema.  Lichenified area on sacrum and medial aspect of left gluteus. Raised,irregular surface lesion on medial aspect of left gluteus with minimal sanguinolent drainage. Not tender and no local heat or fluctuant area.   Psychiatric: She has a normal mood and affect.  Well groomed, good eye contact.    ASSESSMENT AND PLAN:   [name] was seen today for  medication refill.  Diagnoses and all orders for this visit:   Essential hypertension  Not well controlled. Possible complications of elevated BP discussed. Recommend discontinuing Sudafed. She has not checked BP regularly, so before adjusting medications I recommend checking BP daily. Instructed about warning signs.   F/U in 3-4 months.  -     Basic metabolic panel  Rash and nonspecific skin eruption  Chronic and not well controlled. Topical Eucerin:Triamcinolone daily as needed to continue. Sitz bath and try to keep lesion uncovered as possible or use non sticky bandage if needed.  -     Triamcinolone Acetonide (TRIAMCINOLONE 0.1 % CREAM : EUCERIN) CREA; Apply 1 application topically daily as needed for rash or itching.  Morbid obesity due to excess calories ([place]) complicated by hbp  Wt otherwise stable. We discussed benefits of wt loss as well as adverse effects of obesity. Consistency with healthy diet and physical activity recommended.     Return in about 4 months (around 04/30/2018) for HTN.     -[name] was advised to seek immediate medical attention if sudden worsening symptoms.      [name]. [name], MD  [facility]. [place] office.

## 2017-12-31 NOTE — Patient Instructions (Signed)
A few things to remember from today's visit:   Essential hypertension - Plan: Basic metabolic panel  Rash and nonspecific skin eruption  Avoid cold medications. Monitor blood pressure at home. For now no changes in blood pressure medications.  Please be sure medication list is accurate. If a new problem present, please set up appointment sooner than planned today.

## 2018-05-06 DIAGNOSIS — N879 Dysplasia of cervix uteri, unspecified: Secondary | ICD-10-CM | POA: Insufficient documentation

## 2018-05-12 DIAGNOSIS — M79641 Pain in right hand: Secondary | ICD-10-CM | POA: Insufficient documentation

## 2018-05-26 ENCOUNTER — Encounter: Payer: Self-pay | Admitting: Gynecologic Oncology

## 2018-05-26 ENCOUNTER — Telehealth: Payer: Self-pay | Admitting: Oncology

## 2018-05-26 ENCOUNTER — Telehealth: Payer: Self-pay | Admitting: Internal Medicine

## 2018-05-26 ENCOUNTER — Inpatient Hospital Stay: Payer: PRIVATE HEALTH INSURANCE | Attending: Gynecologic Oncology | Admitting: Gynecologic Oncology

## 2018-05-26 VITALS — BP 130/80 | HR 65 | Temp 98.0°F | Resp 20 | Ht 65.5 in | Wt 227.9 lb

## 2018-05-26 DIAGNOSIS — C541 Malignant neoplasm of endometrium: Secondary | ICD-10-CM | POA: Diagnosis not present

## 2018-05-26 DIAGNOSIS — I10 Essential (primary) hypertension: Secondary | ICD-10-CM

## 2018-05-26 DIAGNOSIS — R600 Localized edema: Secondary | ICD-10-CM

## 2018-05-26 NOTE — H&P (View-Only) (Signed)
Consult Note: Gyn-Onc  Consult was requested by [name] for the evaluation of [name] 62 y.o. female  CC:  Chief Complaint  Patient presents with  . endometrial cancer    Assessment/Plan:  [name]  is a 62 y.o.  year old with grade 1 endometrioid endometrial cancer in the setting of a history of prior pelvic RT for anal cancer and sarcoidosis of the lung.  A detailed discussion was held with the patient and her family with regard to to her endometrial cancer diagnosis. We discussed the standard management options for uterine cancer which includes surgery followed possibly by adjuvant therapy depending on the results of surgery. The options for surgical management include a hysterectomy and removal of the tubes and ovaries possibly with removal of pelvic and para-aortic lymph nodes.If feasible, a minimally invasive approach including a robotic hysterectomy or laparoscopic hysterectomy have benefits including shorter hospital stay, recovery time and better wound healing than with open surgery. The patient has been counseled about these surgical options and the risks of surgery in general including infection, bleeding, damage to surrounding structures (including bowel, bladder, ureters, nerves or vessels), and the postoperative risks of PE/ DVT, and lymphedema. I extensively reviewed the additional risks of robotic hysterectomy including possible need for conversion to open laparotomy.  I discussed positioning during surgery of trendelenberg and risks of minor facial swelling and care we take in preoperative positioning.  I had an extensive discussion with her daughter and the patient regarding the increased risks that her prior radiation places particularly with respect to risk of wound healing, infection, and fistula formation between GI and GU structures in the vagina.  I also discussed that her sarcoidosis and lung disease places her at increased perioperative anesthesia risks  and but the potential for difficulty ventilating.   We discussed alternatives to surgery which would in her case involve hormonal therapy with either oral progestins or D&C and IUD progestin.  I discussed that the benefit of this is that it avoid surgical risk, however it does not provide [place] with surgical staging information, and is less definitive.  I explained that less than half of woman treated with progestin therapy have resolution of the endometrial cancer and repeated sampling.    After counseling and consideration of her options, she desires to proceed with robotic assisted total hysterectomy with bilateral sapingo-oophorectomy and SLN biopsy.   She will be seen by anesthesia for preoperative clearance and discussion of postoperative pain management.    We will discuss if optimization of her pulmonary status is necessary preoperatively.   She was given the opportunity to ask questions, which were answered to her satisfaction, and she is agreement with the above mentioned plan of care.   HPI: [name] is a 62 year old parous woman who is seen in consultation at the request of [name] for endometrial cancer.   Patient began experiencing postmenopausal bleeding in May 2019.  Prior to that time she had been amenorrheic since receiving whole pelvic radiation in 2005 as neoadjuvant treatment with chemotherapy for locally advanced anal cancer.  Following radiation she had a good response and required a local resection at the anal canal but did not require an APR colostomy.  Since receiving that radiation treatment she has had ongoing toxicities from radiation including loose stools.  Her other significant medical histories include obesity.  She also has a significant medical history of sarcoidosis with particular impact upon her pulmonary function.  She takes  daily prednisone 5 mg for this and additional inhaled agents.  She sees [name] for pulmonology care.  She reports shortness of  breath with ambulation for more than 20 m and shortness of breath upon stairclimbing.  She cannot comfortably lie flat due to shortness of breath.  She does not use oxygen therapy.  Her any prior abdominal surgical history is a laparoscopic cholecystectomy.  She has however had 2 prior cesarean sections.  Current Meds:  Outpatient Encounter Medications as of 05/26/2018  Medication Sig  . albuterol (PROVENTIL HFA;VENTOLIN HFA) 108 (90 Base) MCG/ACT inhaler Inhale 2 puffs into the lungs every 4 (four) hours as needed for wheezing or shortness of breath.  Marland Kitchen amLODipine (NORVASC) 10 MG tablet Take 1 tablet (10 mg total) by mouth daily.  . bisoprolol (ZEBETA) 10 MG tablet Take 1 tablet (10 mg total) by mouth daily. (Patient taking differently: Take 10 mg by mouth daily with breakfast. )  . Dextromethorphan-Menthol (DELSYM COUGH RELIEF MT) Use as directed 2 mL/oz in the mouth or throat.  . famotidine (PEPCID) 20 MG tablet Take 1 tablet (20 mg total) by mouth at bedtime.  . hydrochlorothiazide (HYDRODIURIL) 25 MG tablet Take 1 tablet (25 mg total) by mouth every morning.  . lidocaine-hydrocortisone (ANAMANTEL HC) 3-0.5 % CREA Place 1 Applicatorful rectally as needed.  . pantoprazole (PROTONIX) 40 MG tablet Take 30-60 min before first meal of the day  . phenylephrine (SUDAFED PE) 10 MG TABS tablet Take 10 mg every 4 (four) hours as needed by mouth.  . predniSONE (STERAPRED UNI-PAK 21 TAB) 5 MG (21) TBPK tablet One with breakfast daily  . traMADol (ULTRAM) 50 MG tablet 1-2 every 4 hours as needed for cough or pain  . Triamcinolone Acetonide (TRIAMCINOLONE 0.1 % CREAM : EUCERIN) CREA Apply 1 application topically daily as needed for rash or itching.  . [DISCONTINUED] clonazePAM (KLONOPIN) 1 MG tablet Take 1 mg by mouth daily as needed for anxiety.   No facility-administered encounter medications on file as of 05/26/2018.     Allergy:  Allergies  Allergen Reactions  . Shellfish Allergy Itching and  Swelling  . Penicillins Hives and Itching    Social Hx:   Social History   Socioeconomic History  . Marital status: Single    Spouse name: Not on file  . Number of children: Not on file  . Years of education: Not on file  . Highest education level: Not on file  Occupational History  . Occupation: Tech 1    Employer: [employer]  . Financial resource strain: Not on file  . Food insecurity:    Worry: Not on file    Inability: Not on file  . Transportation needs:    Medical: Not on file    Non-medical: Not on file  Tobacco Use  . Smoking status: Former Smoker    Packs/day: 0.50    Years: 1.00    Pack years: 0.50    Types: Cigarettes    Last attempt to quit: 12/16/1993    Years since quitting: 24.4  . Smokeless tobacco: Never Used  Substance and Sexual Activity  . Alcohol use: Yes    Comment: occasional  . Drug use: No  . Sexual activity: Never  Lifestyle  . Physical activity:    Days per week: Not on file    Minutes per session: Not on file  . Stress: Not on file  Relationships  . Social connections:    Talks on phone: Not  on file    Gets together: Not on file    Attends religious service: Not on file    Active member of club or organization: Not on file    Attends meetings of clubs or organizations: Not on file    Relationship status: Not on file  . Intimate partner violence:    Fear of current or ex partner: Not on file    Emotionally abused: Not on file    Physically abused: Not on file    Forced sexual activity: Not on file  Other Topics Concern  . Not on file  Social History Narrative  . Not on file    Past Surgical Hx:  Past Surgical History:  Procedure Laterality Date  . BRONCHOSCOPY  01/22/2008   w/ Lavage and bronchial bx  . CARDIOVASCULAR STRESS TEST  06/27/2006   normal nuclear study w/ no ischemia/  normal LV function and wall motion , ef 61%  . CARPAL TUNNEL RELEASE Right 01/17/2016   and finger trigger release  . CLEFT LIP  REPAIR    . CO2 LASER ABLATION VULVA    . EUA/  EXCISIONAL MULTIPLE PERINEAL/ANAL BX'S/  SIGMOIDSCOPY/  PORT-A-CATH PLACEMENT  03/27/2004  . LAPAROSCOPIC CHOLECYSTECTOMY  08/14/2008  . MULTIPLE ANAL BX'S  10/10/2006  . RELEASE A1 PULLEY THUMB Bilateral left 12-17-2006/  right 09-05-2009  . REMOVAL PORT-A-CATH/  EUA/  MULTIPLE ANAL BX'S  10/16/2004  . TENNIS ELBOW RELEASE/NIRSCHEL PROCEDURE Right 11/28/2016   Procedure: RIGHT ELBOW ULNER NERVE RELEASE AND OR TRANSPOSITION;  Surgeon: Iran Planas, MD;  Location: [facility];  Service: Orthopedics;  Laterality: Right;    Past Medical Hx:  Past Medical History:  Diagnosis Date  . Bifid uvula   . GERD (gastroesophageal reflux disease)   . History of cervical dysplasia   . History of chemotherapy    06/ 2005  . History of cleft lip    CORRECTED  . History of condyloma acuminatum    PERIANAL  . History of radiation therapy    completed 06/ 2005  . 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 History of vulvar dysplasia   . Hypertension   . 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 Pulmonary nodule, left   . Pulmonary sarcoidosis ([place]) since [place]  . Sarcoid uveitis of left eye goes to [facility]--  currently no issues per pt 11-27-2016  . Upper airway cough syndrome   . Wears glasses     Past Gynecological History:  Cervical dysplasia No LMP recorded. Patient is postmenopausal.  Family Hx:  Family History  Problem Relation Age of Onset  . Allergies Mother   . Asthma Mother   . Lung cancer Mother   . Allergies Sister   . Allergies Brother   . Asthma Brother   . Asthma Sister   . Sarcoidosis Neg Hx     Review of Systems:  Constitutional  Feels well,    ENT Normal appearing ears and nares bilaterally Skin/Breast  No rash, sores, jaundice, itching, dryness Cardiovascular   No chest pain or edema  Pulmonary  + shortness of breath  Gastro Intestinal  No nausea, vomitting.. No bright red blood per rectum, no abdominal pain, change in bowel movement, or constipation. + diarrhea Genito Urinary  No frequency, urgency, dysuria, + bleeding Musculo Skeletal  No myalgia,  arthralgia, joint swelling or pain  Neurologic  No weakness, numbness, change in gait,  Psychology  No depression, anxiety, insomnia.   Vitals:  Blood pressure 130/80, pulse 65, temperature 98 F (36.7 C), temperature source Oral, resp. rate 20, height 5' 5.5" (1.664 m), weight 227 lb 14.4 oz (103.4 kg), SpO2 99 %.  Physical Exam: WD in NAD Neck  Supple NROM, without any enlargements.  Lymph Node Survey No cervical supraclavicular or inguinal adenopathy Cardiovascular  Pulse normal rate, regularity and rhythm. S1 and S2 normal.  Lungs  Clear to auscultation bilateraly, without wheezes/crackles/rhonchi. Good air movement.  Skin  No rash/lesions/breakdown  Psychiatry  Alert and oriented to person, place, and time  Abdomen  Normoactive bowel sounds, abdomen soft, non-tender and obese without evidence of hernia.  Back No CVA tenderness Genito Urinary  Vulva/vagina: Normal external female genitalia.  No lesions. No discharge or bleeding.  Bladder/urethra:  No lesions or masses, well supported bladder  Vagina: grossly normal, slightly pale consistent with radiation changes, but not fibrotic  Cervix: Normal appearing, no lesions.  Uterus:  Small, mobile, no parametrial involvement or nodularity.  Adnexa: no discretely palpable masses. Rectal  Deferred, no visible anal lesions Extremities  No bilateral cyanosis, clubbing or edema.   Thereasa Solo, MD  05/26/2018, 5:04 PM

## 2018-05-26 NOTE — Progress Notes (Signed)
Consult Note: Gyn-Onc  Consult was requested by Dr. Vanessa Kick for the evaluation of [name] 62 y.o. female  CC:  Chief Complaint  Patient presents with  . endometrial cancer    Assessment/Plan:  [name]  is a 62 y.o.  year old with grade 1 endometrioid endometrial cancer in the setting of a history of prior pelvic RT for anal cancer and sarcoidosis of the lung.  A detailed discussion was held with the patient and her family with regard to to her endometrial cancer diagnosis. We discussed the standard management options for uterine cancer which includes surgery followed possibly by adjuvant therapy depending on the results of surgery. The options for surgical management include a hysterectomy and removal of the tubes and ovaries possibly with removal of pelvic and para-aortic lymph nodes.If feasible, a minimally invasive approach including a robotic hysterectomy or laparoscopic hysterectomy have benefits including shorter hospital stay, recovery time and better wound healing than with open surgery. The patient has been counseled about these surgical options and the risks of surgery in general including infection, bleeding, damage to surrounding structures (including bowel, bladder, ureters, nerves or vessels), and the postoperative risks of PE/ DVT, and lymphedema. I extensively reviewed the additional risks of robotic hysterectomy including possible need for conversion to open laparotomy.  I discussed positioning during surgery of trendelenberg and risks of minor facial swelling and care we take in preoperative positioning.  I had an extensive discussion with her daughter and the patient regarding the increased risks that her prior radiation places particularly with respect to risk of wound healing, infection, and fistula formation between GI and GU structures in the vagina.  I also discussed that her sarcoidosis and lung disease places her at increased perioperative anesthesia risks  and but the potential for difficulty ventilating.   We discussed alternatives to surgery which would in her case involve hormonal therapy with either oral progestins or D&C and IUD progestin.  I discussed that the benefit of this is that it avoid surgical risk, however it does not provide [place] with surgical staging information, and is less definitive.  I explained that less than half of woman treated with progestin therapy have resolution of the endometrial cancer and repeated sampling.    After counseling and consideration of her options, she desires to proceed with robotic assisted total hysterectomy with bilateral sapingo-oophorectomy and SLN biopsy.   She will be seen by anesthesia for preoperative clearance and discussion of postoperative pain management.    We will discuss if optimization of her pulmonary status is necessary preoperatively.   She was given the opportunity to ask questions, which were answered to her satisfaction, and she is agreement with the above mentioned plan of care.   HPI: [name] is a 62 year old parous woman who is seen in consultation at the request of Dr Harrington Challenger for endometrial cancer.   Patient began experiencing postmenopausal bleeding in May 2019.  Prior to that time she had been amenorrheic since receiving whole pelvic radiation in 2005 as neoadjuvant treatment with chemotherapy for locally advanced anal cancer.  Following radiation she had a good response and required a local resection at the anal canal but did not require an APR colostomy.  Since receiving that radiation treatment she has had ongoing toxicities from radiation including loose stools.  Her other significant medical histories include obesity.  She also has a significant medical history of sarcoidosis with particular impact upon her pulmonary function.  She takes  daily prednisone 5 mg for this and additional inhaled agents.  She sees Dr. Melvyn Novas for pulmonology care.  She reports shortness of  breath with ambulation for more than 20 m and shortness of breath upon stairclimbing.  She cannot comfortably lie flat due to shortness of breath.  She does not use oxygen therapy.  Her any prior abdominal surgical history is a laparoscopic cholecystectomy.  She has however had 2 prior cesarean sections.  Current Meds:  Outpatient Encounter Medications as of 05/26/2018  Medication Sig  . albuterol (PROVENTIL HFA;VENTOLIN HFA) 108 (90 Base) MCG/ACT inhaler Inhale 2 puffs into the lungs every 4 (four) hours as needed for wheezing or shortness of breath.  Marland Kitchen amLODipine (NORVASC) 10 MG tablet Take 1 tablet (10 mg total) by mouth daily.  . bisoprolol (ZEBETA) 10 MG tablet Take 1 tablet (10 mg total) by mouth daily. (Patient taking differently: Take 10 mg by mouth daily with breakfast. )  . Dextromethorphan-Menthol (DELSYM COUGH RELIEF MT) Use as directed 2 mL/oz in the mouth or throat.  . famotidine (PEPCID) 20 MG tablet Take 1 tablet (20 mg total) by mouth at bedtime.  . hydrochlorothiazide (HYDRODIURIL) 25 MG tablet Take 1 tablet (25 mg total) by mouth every morning.  . lidocaine-hydrocortisone (ANAMANTEL HC) 3-0.5 % CREA Place 1 Applicatorful rectally as needed.  . pantoprazole (PROTONIX) 40 MG tablet Take 30-60 min before first meal of the day  . phenylephrine (SUDAFED PE) 10 MG TABS tablet Take 10 mg every 4 (four) hours as needed by mouth.  . predniSONE (STERAPRED UNI-PAK 21 TAB) 5 MG (21) TBPK tablet One with breakfast daily  . traMADol (ULTRAM) 50 MG tablet 1-2 every 4 hours as needed for cough or pain  . Triamcinolone Acetonide (TRIAMCINOLONE 0.1 % CREAM : EUCERIN) CREA Apply 1 application topically daily as needed for rash or itching.  . [DISCONTINUED] clonazePAM (KLONOPIN) 1 MG tablet Take 1 mg by mouth daily as needed for anxiety.   No facility-administered encounter medications on file as of 05/26/2018.     Allergy:  Allergies  Allergen Reactions  . Shellfish Allergy Itching and  Swelling  . Penicillins Hives and Itching    Social Hx:   Social History   Socioeconomic History  . Marital status: Single    Spouse name: Not on file  . Number of children: Not on file  . Years of education: Not on file  . Highest education level: Not on file  Occupational History  . Occupation: Tech 1    Employer: [employer]  . Financial resource strain: Not on file  . Food insecurity:    Worry: Not on file    Inability: Not on file  . Transportation needs:    Medical: Not on file    Non-medical: Not on file  Tobacco Use  . Smoking status: Former Smoker    Packs/day: 0.50    Years: 1.00    Pack years: 0.50    Types: Cigarettes    Last attempt to quit: 12/16/1993    Years since quitting: 24.4  . Smokeless tobacco: Never Used  Substance and Sexual Activity  . Alcohol use: Yes    Comment: occasional  . Drug use: No  . Sexual activity: Never  Lifestyle  . Physical activity:    Days per week: Not on file    Minutes per session: Not on file  . Stress: Not on file  Relationships  . Social connections:    Talks on phone: Not  on file    Gets together: Not on file    Attends religious service: Not on file    Active member of club or organization: Not on file    Attends meetings of clubs or organizations: Not on file    Relationship status: Not on file  . Intimate partner violence:    Fear of current or ex partner: Not on file    Emotionally abused: Not on file    Physically abused: Not on file    Forced sexual activity: Not on file  Other Topics Concern  . Not on file  Social History Narrative  . Not on file    Past Surgical Hx:  Past Surgical History:  Procedure Laterality Date  . BRONCHOSCOPY  01/22/2008   w/ Lavage and bronchial bx  . CARDIOVASCULAR STRESS TEST  06/27/2006   normal nuclear study w/ no ischemia/  normal LV function and wall motion , ef 61%  . CARPAL TUNNEL RELEASE Right 01/17/2016   and finger trigger release  . CLEFT LIP  REPAIR    . CO2 LASER ABLATION VULVA    . EUA/  EXCISIONAL MULTIPLE PERINEAL/ANAL BX'S/  SIGMOIDSCOPY/  PORT-A-CATH PLACEMENT  03/27/2004  . LAPAROSCOPIC CHOLECYSTECTOMY  08/14/2008  . MULTIPLE ANAL BX'S  10/10/2006  . RELEASE A1 PULLEY THUMB Bilateral left 12-17-2006/  right 09-05-2009  . REMOVAL PORT-A-CATH/  EUA/  MULTIPLE ANAL BX'S  10/16/2004  . TENNIS ELBOW RELEASE/NIRSCHEL PROCEDURE Right 11/28/2016   Procedure: RIGHT ELBOW ULNER NERVE RELEASE AND OR TRANSPOSITION;  Surgeon: Iran Planas, MD;  Location: [place];  Service: Orthopedics;  Laterality: Right;    Past Medical Hx:  Past Medical History:  Diagnosis Date  . Bifid uvula   . GERD (gastroesophageal reflux disease)   . History of cervical dysplasia   . History of chemotherapy    06/ 2005  . History of cleft lip    CORRECTED  . History of condyloma acuminatum    PERIANAL  . History of radiation therapy    completed 06/ 2005  . 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 History of vulvar dysplasia   . Hypertension   . 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 Pulmonary nodule, left   . Pulmonary sarcoidosis ([place]) since [place]  . 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 Upper airway cough syndrome   . Wears glasses     Past Gynecological History:  Cervical dysplasia No LMP recorded. Patient is postmenopausal.  Family Hx:  Family History  Problem Relation Age of Onset  . Allergies Mother   . Asthma Mother   . Lung cancer Mother   . Allergies Sister   . Allergies Brother   . Asthma Brother   . Asthma Sister   . Sarcoidosis Neg Hx     Review of Systems:  Constitutional  Feels well,    ENT Normal appearing ears and nares bilaterally Skin/Breast  No rash, sores, jaundice, itching, dryness Cardiovascular   No chest pain or edema  Pulmonary  + shortness of breath  Gastro Intestinal  No nausea, vomitting.. No bright red blood per rectum, no abdominal pain, change in bowel movement, or constipation. + diarrhea Genito Urinary  No frequency, urgency, dysuria, + bleeding Musculo Skeletal  No myalgia,  arthralgia, joint swelling or pain  Neurologic  No weakness, numbness, change in gait,  Psychology  No depression, anxiety, insomnia.   Vitals:  Blood pressure 130/80, pulse 65, temperature 98 F (36.7 C), temperature source Oral, resp. rate 20, height 5' 5.5" (1.664 m), weight 227 lb 14.4 oz (103.4 kg), SpO2 99 %.  Physical Exam: WD in NAD Neck  Supple NROM, without any enlargements.  Lymph Node Survey No cervical supraclavicular or inguinal adenopathy Cardiovascular  Pulse normal rate, regularity and rhythm. S1 and S2 normal.  Lungs  Clear to auscultation bilateraly, without wheezes/crackles/rhonchi. Good air movement.  Skin  No rash/lesions/breakdown  Psychiatry  Alert and oriented to person, place, and time  Abdomen  Normoactive bowel sounds, abdomen soft, non-tender and obese without evidence of hernia.  Back No CVA tenderness Genito Urinary  Vulva/vagina: Normal external female genitalia.  No lesions. No discharge or bleeding.  Bladder/urethra:  No lesions or masses, well supported bladder  Vagina: grossly normal, slightly pale consistent with radiation changes, but not fibrotic  Cervix: Normal appearing, no lesions.  Uterus:  Small, mobile, no parametrial involvement or nodularity.  Adnexa: no discretely palpable masses. Rectal  Deferred, no visible anal lesions Extremities  No bilateral cyanosis, clubbing or edema.   Thereasa Solo, MD  05/26/2018, 5:04 PM

## 2018-05-26 NOTE — Telephone Encounter (Signed)
Left a message for Dr. Gustavus Bryant nurse requesting a surgical clearance letter and optimization for surgery on 06/25/18.

## 2018-05-26 NOTE — Telephone Encounter (Signed)
Called and spoke with [name] she states that the patient is having a total hysterectomy on 7.11.19. They are needing a letter of surgical clearance as well as optimization of surgery from a pulmonary stand point. She is aware that MW is out of office until next week and will wait on his response.    MW please advise, thank you.

## 2018-05-26 NOTE — Patient Instructions (Addendum)
Preparing for your Surgery  Plan for surgery on June 25, 2018 with Dr. Everitt Amber at [facility] will be scheduled for a robotic assisted total hysterectomy, bilateral salpingo-oophorectomy, sentinel lymph node biopsy.    We will be reaching out to Dr. Melvyn Novas for optimization for surgery from a pulmonary standpoint as well.  Pre-operative Testing -You will receive a phone call from presurgical testing at [facility] to arrange for a pre-operative testing appointment before your surgery.  This appointment normally occurs one to two weeks before your scheduled surgery.   -Bring your insurance card, copy of an advanced directive if applicable, medication list  -At that visit, you will be asked to sign a consent for a possible blood transfusion in case a transfusion becomes necessary during surgery.  The need for a blood transfusion is rare but having consent is a necessary part of your care.     -You should not be taking blood thinners or aspirin at least ten days prior to surgery unless instructed by your surgeon.  Day Before Surgery at [place] will be asked to take in a light diet the day before surgery.  Avoid carbonated beverages.  You will be advised to have nothing to eat or drink after midnight the evening before.    Eat a light diet the day before surgery.  Examples including soups, broths, toast, yogurt, mashed potatoes.  Things to avoid include carbonated beverages (fizzy beverages), raw fruits and raw vegetables, or beans.   If your bowels are filled with gas, your surgeon will have difficulty visualizing your pelvic organs which increases your surgical risks.  Your role in recovery Your role is to become active as soon as directed by your doctor, while still giving yourself time to heal.  Rest when you feel tired. You will be asked to do the following in order to speed your recovery:  - Cough and breathe deeply. This helps toclear and expand  your lungs and can prevent pneumonia. You may be given a spirometer to practice deep breathing. A staff member will show you how to use the spirometer. - Do mild physical activity. Walking or moving your legs help your circulation and body functions return to normal. A staff member will help you when you try to walk and will provide you with simple exercises. Do not try to get up or walk alone the first time. - Actively manage your pain. Managing your pain lets you move in comfort. We will ask you to rate your pain on a scale of zero to 10. It is your responsibility to tell your doctor or nurse where and how much you hurt so your pain can be treated.  Special Considerations -If you are diabetic, you may be placed on insulin after surgery to have closer control over your blood sugars to promote healing and recovery.  This does not mean that you will be discharged on insulin.  If applicable, your oral antidiabetics will be resumed when you are tolerating a solid diet.  -Your final pathology results from surgery should be available by the Friday after surgery and the results will be relayed to you when available.  -Dr. Lahoma Crocker is the Surgeon that assists your GYN Oncologist with surgery.  The next day after your surgery you will either see your GYN Oncologist or Dr. Lahoma Crocker.   Blood Transfusion Information WHAT IS A BLOOD TRANSFUSION? A transfusion is the replacement of blood or some of its parts. Blood  is made up of multiple cells which provide different functions.  Red blood cells carry oxygen and are used for blood loss replacement.  White blood cells fight against infection.  Platelets control bleeding.  Plasma helps clot blood.  Other blood products are available for specialized needs, such as hemophilia or other clotting disorders. BEFORE THE TRANSFUSION  Who gives blood for transfusions?   You may be able to donate blood to be used at a later date on yourself  (autologous donation).  Relatives can be asked to donate blood. This is generally not any safer than if you have received blood from a stranger. The same precautions are taken to ensure safety when a relative's blood is donated.  Healthy volunteers who are fully evaluated to make sure their blood is safe. This is blood bank blood. Transfusion therapy is the safest it has ever been in the practice of medicine. Before blood is taken from a donor, a complete history is taken to make sure that person has no history of diseases nor engages in risky social behavior (examples are intravenous drug use or sexual activity with multiple partners). The donor's travel history is screened to minimize risk of transmitting infections, such as malaria. The donated blood is tested for signs of infectious diseases, such as HIV and hepatitis. The blood is then tested to be sure it is compatible with you in order to minimize the chance of a transfusion reaction. If you or a relative donates blood, this is often done in anticipation of surgery and is not appropriate for emergency situations. It takes many days to process the donated blood. RISKS AND COMPLICATIONS Although transfusion therapy is very safe and saves many lives, the main dangers of transfusion include:   Getting an infectious disease.  Developing a transfusion reaction. This is an allergic reaction to something in the blood you were given. Every precaution is taken to prevent this. The decision to have a blood transfusion has been considered carefully by your caregiver before blood is given. Blood is not given unless the benefits outweigh the risks.

## 2018-05-27 MED ORDER — HYDROCHLOROTHIAZIDE 25 MG PO TABS: 25.0000 mg | ORAL_TABLET | Freq: Every morning | ORAL | 0 refills | Status: DC

## 2018-05-27 NOTE — Telephone Encounter (Signed)
Called and spoke with patient, she has been scheduled to see MW for 7.2.19 at 10:45. Patient also stated that she is needing a refill of her BP medication to get her through until the appointment. Refill sent to get her through the appointment.

## 2018-05-27 NOTE — Telephone Encounter (Signed)
Ov with all meds and med calendar in hand preop

## 2018-06-16 ENCOUNTER — Encounter: Payer: Self-pay | Admitting: Internal Medicine

## 2018-06-16 ENCOUNTER — Ambulatory Visit (INDEPENDENT_AMBULATORY_CARE_PROVIDER_SITE_OTHER)
Admission: RE | Admit: 2018-06-16 | Discharge: 2018-06-16 | Disposition: A | Discharge status: Home / Self Care | Payer: PRIVATE HEALTH INSURANCE | Source: Ambulatory Visit | Attending: Internal Medicine | Admitting: Internal Medicine

## 2018-06-16 ENCOUNTER — Ambulatory Visit (INDEPENDENT_AMBULATORY_CARE_PROVIDER_SITE_OTHER): Payer: PRIVATE HEALTH INSURANCE | Admitting: Internal Medicine

## 2018-06-16 VITALS — BP 146/86 | HR 87 | Ht 65.5 in | Wt 225.0 lb

## 2018-06-16 DIAGNOSIS — D869 Sarcoidosis, unspecified: Secondary | ICD-10-CM | POA: Diagnosis not present

## 2018-06-16 DIAGNOSIS — R058 Other specified cough: Secondary | ICD-10-CM

## 2018-06-16 DIAGNOSIS — R05 Cough: Secondary | ICD-10-CM

## 2018-06-16 DIAGNOSIS — I10 Essential (primary) hypertension: Secondary | ICD-10-CM

## 2018-06-16 MED ORDER — BISOPROLOL FUMARATE 10 MG PO TABS: 10.0000 mg | ORAL_TABLET | Freq: Every day | ORAL | 1 refills | Status: DC

## 2018-06-16 MED ORDER — AMLODIPINE BESYLATE 10 MG PO TABS: 10.0000 mg | ORAL_TABLET | Freq: Every day | ORAL | 0 refills | Status: DC

## 2018-06-16 MED ORDER — ALBUTEROL SULFATE HFA 108 (90 BASE) MCG/ACT IN AERS: 2.0000 | INHALATION_SPRAY | RESPIRATORY_TRACT | 5 refills | Status: DC | PRN

## 2018-06-16 NOTE — Progress Notes (Signed)
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  Last chronic prednisone 04/15/08 with no flare cough no nodules (right leg and left hand and right arm)   History of Present Illness  Last ov 02/17/13 no evidence active sarcoid rec Aciphex 20 mg Take 30-60 min before first meal of the day also when coughing for any reason take Pepcid 20 mg one at bedtime  Take delsym two tsp every 12 hours and supplement if needed with  tramadol 50 mg up to 2 every 4 hours to suppress the urge to cough. Swallowing water or using ice chips/non mint and menthol containing candies (such as lifesavers or sugarless jolly ranchers) are also effective.  You should rest your voice and avoid activities that you know make you cough. Once you have eliminated the cough for 3 straight days try reducing the tramadol first,  then the delsym as tolerated.   GERD diet    01/28/2014  Pulmonary consultation / [name]/ on no maint resp rx / rare baseline need for saba  Chief Complaint  Patient presents with  . Pulmonary Consult    Referred per Dr. Posey Pronto. [name] Cough is prod with moderate yellow sputum. She is using proair approx tid.  Onset was Jan 01 2014 while on aciphex with bfast with flu like illness but not follow contingency rx  On prednisone taper/ sp tamiflu/cough meds  Not limited by breathing from desired activities   rec Aciphex 20 mg Take 30-60 min before first meal of the day also when coughing for any reason take  Pepcid 20 mg one at bedtime and chlortrimeton 4 mg at bedtime Take delsym two tsp every 12 hours and supplement if needed with  tramadol 50 mg up to 2 every 4 hours to suppress the urge to cough.   Once you have eliminated the cough for 3 straight days try reducing the tramadol first,  then the delsym as tolerated.   GERD diet         05/07/2017 acute extended ov/[name]: cough > sob  Chief Complaint  Patient presents with  . Acute Visit    Cough not improving, non prod "feels like something in my throat". She is waking up in the night coughing.   was doing fine for months since last ov with me but not doing well as well since 02/18/17 when placed on lisinopril and gradually  downhill since on multple courses of prednisone and now symbicort (though very poor hfa) and much worse dry cough 24/7 no longer has med calendar and doesn't even recognize the copy we have / very confused with meds  rec Stop lotensin and start valasartan 325 mg one daily  Start amlodipine 10 mg daily  Work on inhaler technique:  albuterol as a rescue medication to be used if you can't catch your breath For cough use the flutter valve and Take delsym two tsp every 12 hours and supplement if needed with  tramadol 50 mg up to 2 every 4 hours to suppress  the urge to cough   Finish your prednisone Follow the med calendar daily and bring it back with you to all office visits      05/22/2017  f/u ov/[name] [name]: cough > sob off acei x 2 weeks some better  Chief Complaint  Patient presents with  . Follow-up    Here to go over medication   doe = MMRC2 = can't walk a nl pace on a flat grade s sob but does fine slow and flat   Cough mostly daytime / dry/ never took tramadol correctly rec Take delsym two tsp every 12 hours and supplement if needed with  tramadol 50 mg up to 2 every 4 hours to suppress the urge to cough  Prednisone 10 mg x 2  Each am x 3 days then 1 daily x 3 days and stop.      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 Follow-up    Cough and SOB are some better, but not back to her normal baseline. Her cough is non prod. She also notices some wheezing.   rarely if ever uses saba at hs/ mostly daytime need of over doe it with exertion / no following recs for gerd/ no longer on ppi or h2 hs - much less need for saba than prior to stopping acei  Did not see [name] for med calendar as req / did not take am meds yet  rec Pantoprazole (protonix) 40 mg   Take  30-60 min before first meal of the day and Pepcid (famotidine)  20 mg one @  bedtime until return to office - this is the best way to tell whether stomach acid is contributing to your problem.   GERD diet   09/09/17 [name] ov rec Continue on Prednisone 20mg  daily for 2 weeks then 10mg  daily for 2 weeks then 5mg  daily .   surgery 09/23/17 [name] / [name]:  - ANGIOKERATOMA.  10/21/2017  f/u ov/[name] [name]:  Sarcoid/ steroid dep / ocular involvmemnt  Chief Complaint  Patient presents with  . Follow-up    Increased cough x 2 wks- non prod. Her breathing is unchanged. She ran out of prednisone approx 2 wks ago and never had it refilled.   confused [name] details of care  ? Flare of ocular sarcoid off pred > Bowing otc eyedrops for wetting and no further pred eyedrops Breathing worse = doe since cough since about a week or two p stopped pred though instructions say to taper to 5 mg  Not using med calendar/ not updated since 2016  rec Prednisone 5 mg daily      06/16/2018  f/u ov/[name] [name]: sarcoid preop clearance/ still on pred 5 mg daily - needs TAH/bso Chief Complaint  Patient presents with  . Surgical clearance    She is having a complete hysterectomy and need clearance. Her breathing is okay SOB  with activity. No cough at this time.   Dyspnea:  Across a parking lot = MMRC3 = can't walk 100 yards even at a slow pace at a flat grade s stopping due to sob  Cough:  none  SABA use: once in a blue moon 02: no    No obvious day to day or daytime variability or assoc excess/ purulent sputum or mucus plugs or hemoptysis or cp or chest tightness, subjective wheeze or overt sinus or hb symptoms.  Sleeping: 10 degrees/ 1 pillow without nocturnal  or early am exacerbation  of respiratory  c/o's or need for noct saba. Also denies any obvious fluctuation of symptoms with weather or environmental changes or other aggravating or alleviating factors except as outlined above   No unusual exposure hx or h/o childhood pna/ asthma or knowledge of premature birth.  Current Allergies, Complete Past Medical History, Past Surgical History, Family History, and Social History were reviewed in [employer] record.  ROS  The following are not active complaints unless bolded Hoarseness, sore throat, dysphagia, dental problems, itching, sneezing,  nasal congestion or discharge of excess mucus or purulent secretions, ear ache,   fever, chills, sweats, unintended wt loss or wt gain, classically pleuritic or exertional cp,  orthopnea pnd or arm/hand swelling  or leg swelling, presyncope, palpitations, abdominal pain, anorexia, nausea, vomiting, diarrhea  or change in bowel habits or change in bladder habits, change in stools or change in urine, dysuria, hematuria,  rash, arthralgias, visual complaints, headache, numbness, weakness or ataxia or problems with walking or coordination,  change in mood or  memory.        Current Meds  Medication Sig  . albuterol (PROVENTIL HFA;VENTOLIN HFA) 108 (90 Base) MCG/ACT inhaler Inhale 2 puffs into the lungs every 4 (four) hours as needed for wheezing or shortness of breath.  Marland Kitchen amLODipine (NORVASC) 10 MG tablet Take 1 tablet (10 mg total) by mouth daily.  . bisoprolol (ZEBETA) 10 MG tablet Take 1 tablet (10 mg total) by mouth daily.  Marland Kitchen Dextromethorphan-Menthol (DELSYM COUGH RELIEF MT) Take 5 mLs by mouth daily as needed  (cough).   . famotidine (PEPCID) 20 MG tablet Take 1 tablet (20 mg total) by mouth at bedtime.  . hydrochlorothiazide (HYDRODIURIL) 25 MG tablet Take 1 tablet (25 mg total) by mouth every morning.  . pantoprazole (PROTONIX) 40 MG tablet Take 30-60 min before first meal of the day  . phenylephrine (SUDAFED PE) 10 MG TABS tablet Take 10 mg by mouth every 4 (four) hours as needed (allergies).   . predniSONE (DELTASONE) 1 MG tablet Take 1 mg by mouth daily with breakfast.  . RABEprazole (ACIPHEX) 20 MG tablet Take 20 mg by mouth daily before breakfast.   . simvastatin (ZOCOR) 20 MG tablet Take 20 mg by mouth daily.  . traMADol (ULTRAM) 50 MG tablet 1-2 every 4 hours as needed for cough or pain (Patient taking differently: Take 50 mg by mouth every 4 (four) hours as needed for moderate pain (cough). )  . Triamcinolone Acetonide (TRIAMCINOLONE 0.1 % CREAM : EUCERIN) CREA Apply 1 application topically daily as needed for rash or itching. (Patient taking differently: Apply 1 application topically daily. )  . [DISCONTINUED] albuterol (PROVENTIL HFA;VENTOLIN HFA) 108 (90 Base) MCG/ACT inhaler Inhale 2 puffs into the lungs every 4 (four) hours as needed for wheezing or shortness of breath.  . [DISCONTINUED] amLODipine (NORVASC) 10 MG tablet Take 1 tablet (10 mg total) by mouth daily.  . [DISCONTINUED] bisoprolol (ZEBETA) 10 MG tablet Take 1 tablet (10 mg total) by mouth daily.              Objective:   Physical Exam    Obese amb bf nad  06/16/2018        225  10/21/2017      224  07/07/2017      223  05/22/2017         219  05/07/2017  222  08/22/2016         216   05/17/2016        215   05/02/15 219 lb (99.338 kg)  04/07/15 219 lb 9.6 oz (99.61 kg)  03/24/15 216 lb (97.977 kg)    Vital signs reviewed - Note on arrival 02 sats  96% on RA and bp 146/86     HEENT: nl dentition, turbinates bilaterally, and oropharynx. Nl external ear canals without cough reflex   NECK :  without JVD/Nodes/TM/  nl carotid upstrokes bilaterally   LUNGS: no acc muscle use,  Nl contour chest which is clear to A and P bilaterally without cough on insp or exp maneuvers   CV:  RRR  no s3 or murmur or increase in P2, and no edema   ABD:  Obese soft and nontender with nl inspiratory excursion in the supine position. No bruits or organomegaly appreciated, bowel sounds nl  MS:  Nl gait/ ext warm without deformities, calf tenderness, cyanosis or clubbing No obvious joint restrictions   SKIN: warm and dry without lesions    NEURO:  alert, approp, nl sensorium with  no motor or cerebellar deficits apparent.      CXR PA and Lateral:   06/16/2018 :    I personally reviewed images and agree with radiology impression as follows:    1. Low lung volumes with bibasilar atelectasis again noted. Chronic interstitial changes consistent with chronic interstitial lung disease again noted.  2.  Cardiomegaly.  No pulmonary venous congestion.         Assessment & Plan:

## 2018-06-16 NOTE — Patient Instructions (Addendum)
We will refill your bisoprolol and amlodipine for 3 months but after than you should let your PCP take over   Please remember to go to the  x-ray department downstairs in the basement  for your tests - we will call you with the results when they are available.       Please schedule a follow up visit in 3 months but call sooner if needed

## 2018-06-17 ENCOUNTER — Encounter: Payer: Self-pay | Admitting: Internal Medicine

## 2018-06-17 NOTE — Assessment & Plan Note (Signed)
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/02/2015 >>> - flare May 2017 p stopped aciphex > rec restart 05/17/2016  - flare since 02/2017 placed on lisinopril with no airflow obst 05/07/2017 by spirometry > d/c ACEi  Permanently  - improved 07/07/2017 but not on gerd rx > rec resume full gerd rx/ stay off acei   Improved on max rx for gerd /diet > continue

## 2018-06-17 NOTE — Assessment & Plan Note (Addendum)
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  - Chronic pred rx 2/09> 04/2008  - Restart Pred December 26, 2008 > June 12, 2009 stopped  > restarted 07/11/11>>pt stopped 10/15/11  - PFT's March 23, 2010 VC 1.77 (66%) no obst, ERV 34%, DLCO 53 > 115% corrected - PFT's 09/30/12       VC 2.02 (57%) ratio 78 , erv 13%  DLCO 55 > 118% corrected - Skin bx 05/19/14 bridge of nose > Pos NCG - 05/02/2015  Walked RA x 3 laps @ 185 ft each stopped due to end of study, no sob/ no desat at nl pace/ no cough provoked  - PFT's  08/22/2016 VC  1.81 ratio 82  with DLCO  59/55 % corrects to 88 % for alv volume  - Spirometry 05/07/2017   FVC  1.81  With no obst by fev1/fvc and no significant curvature  - 05/07/2017  After extensive coaching HFA effectiveness =    75% from a baseline of < 25%  - 05/22/2017   Walked RA x one lap @ 185 stopped due to  Sob at fast pace with sats 96% - 09/09/17 placed on floor dose of 5 mg daily and flared late Oct 2018 when ran out of rx   - Spirometry 06/16/2018  FVC  1.82 (62%) s obst  - 06/16/2018   Walked RA x one lap @ 185 stopped due to  Sob, mod fast pace, no desats    She has restrictive changes and obesity which place her at risk of post op at but good airflow/ cough mechanics so no contrainidcation for surgery from pulmonary perspective but needs early mobilization/ IS and min narcs post op and should do fine   Will need stress HC as well/ she is cleared for surgery   Discussed in detail all the  Usual pulm  risks and alternatives  relative to the benefits with patient who agrees to proceed with surgery   I had an extended discussion with the patient reviewing all relevant studies completed to date and  lasting 25 minutes of a 40  minute pre-op office visit to clear for abd surgery with ongoing  non-specific but potentially very serious refractory respiratory symptoms of uncertain and potentially multiple  etiologies.   Each maintenance medication was reviewed in detail including most importantly the  difference between maintenance and prns and under what circumstances the prns are to be triggered using an action plan format that is not reflected in the computer generated alphabetically organized AVS.    Please see AVS for specific instructions unique to this office visit that I personally wrote and verbalized to the the pt in detail and then reviewed with pt  by my nurse highlighting any changes in therapy/plan of care  recommended at today's visit.

## 2018-06-17 NOTE — Patient Instructions (Signed)
[name]  06/17/2018   Your procedure is scheduled on: 06-25-18   Report to [facility] Main  Entrance    Report to Admitting at 7:30  AM   Eat a light diet the day before surgery.  Examples including soups, broths, toast, yogurt, mashed potatoes.  Things to avoid include carbonated beverages (fizzy beverages), raw fruits and raw vegetables, or beans.   If your bowels are filled with gas, your surgeon will have difficulty visualizing your pelvic organs which increases your surgical risks.    Call this number if you have problems the morning of surgery [phone]    Remember: NO SOLID FOOD AFTER MIDNIGHT THE NIGHT PRIOR TO SURGERY. NOTHING BY MOUTH EXCEPT CLEAR LIQUIDS UNTIL 3 HOURS PRIOR TO SCHEDULED SURGERY. PLEASE FINISH ENSURE DRINK PER SURGEON ORDER 3 HOURS PRIOR TO SCHEDULED SURGERY TIME WHICH NEEDS TO BE COMPLETED AT 7:00 AM.     CLEAR LIQUID DIET   Foods Allowed                                                                     Foods Excluded  Coffee and tea, regular and decaf                             liquids that you cannot  Plain Jell-O in any flavor                                             see through such as: Fruit ices (not with fruit pulp)                                     milk, soups, orange juice  Iced Popsicles                                    All solid food Carbonated beverages, regular and diet                                    Cranberry, grape and apple juices Sports drinks like Gatorade Lightly seasoned clear broth or consume(fat free) Sugar, honey syrup  Sample Menu Breakfast                                Lunch                                     Supper Cranberry juice                    Beef broth  Chicken broth Jell-O                                     Grape juice                           Apple juice Coffee or tea                        Jell-O                                       Popsicle                                                Coffee or tea                        Coffee or tea  _____________________________________________________________________    Take these medicines the morning of surgery with A SIP OF WATER: Amlodipine (Norvasc), Bisoprolol (Zebeta), Pantoprazole (Potonix), Rabeprazole (Aciphex), and Simvastatin (Zocor)                                You may not have any metal on your body including hair pins and              piercings  Do not wear jewelry, make-up, lotions, powders or perfumes, deodorant             Do not wear nail polish.  Do not shave  48 hours prior to surgery.                Do not bring valuables to the hospital. [place].  Contacts, dentures or bridgework may not be worn into surgery.  Leave suitcase in the car. After surgery it may be brought to your room.       Special Instructions: Cough and Deep Breath Exercises              Please read over the following fact sheets you were given: _____________________________________________________________________             [facility] - Preparing for Surgery Before surgery, you can play an important role.  Because skin is not sterile, your skin needs to be as free of germs as possible.  You can reduce the number of germs on your skin by washing with CHG (chlorahexidine gluconate) soap before surgery.  CHG is an antiseptic cleaner which kills germs and bonds with the skin to continue killing germs even after washing. Please DO NOT use if you have an allergy to CHG or antibacterial soaps.  If your skin becomes reddened/irritated stop using the CHG and inform your nurse when you arrive at Short Stay. Do not shave (including legs and underarms) for at least 48 hours prior to the first CHG shower.  You may shave your face/neck. Please follow these instructions carefully:  1.  Shower with CHG Soap the night before surgery and  the   morning of Surgery.  2.  If you choose to wash your hair, wash your hair first as usual with your  normal  shampoo.  3.  After you shampoo, rinse your hair and body thoroughly to remove the  shampoo.                           4.  Use CHG as you would any other liquid soap.  You can apply chg directly  to the skin and wash                       Gently with a scrungie or clean washcloth.  5.  Apply the CHG Soap to your body ONLY FROM THE NECK DOWN.   Do not use on face/ open                           Wound or open sores. Avoid contact with eyes, ears mouth and genitals (private parts).                       Wash face,  Genitals (private parts) with your normal soap.             6.  Wash thoroughly, paying special attention to the area where your surgery  will be performed.  7.  Thoroughly rinse your body with warm water from the neck down.  8.  DO NOT shower/wash with your normal soap after using and rinsing off  the CHG Soap.                9.  Pat yourself dry with a clean towel.            10.  Wear clean pajamas.            11.  Place clean sheets on your bed the night of your first shower and do not  sleep with pets. Day of Surgery : Do not apply any lotions/deodorants the morning of surgery.  Please wear clean clothes to the hospital/surgery center.  FAILURE TO FOLLOW THESE INSTRUCTIONS MAY RESULT IN THE CANCELLATION OF YOUR SURGERY PATIENT SIGNATURE_________________________________  NURSE SIGNATURE__________________________________  ________________________________________________________________________   [name]  An incentive spirometer is a tool that can help keep your lungs clear and active. This tool measures how well you are filling your lungs with each breath. Taking long deep breaths may help reverse or decrease the chance of developing breathing (pulmonary) problems (especially infection) following:  A long period of time when you are unable to move or be  active. BEFORE THE PROCEDURE   If the spirometer includes an indicator to show your best effort, your nurse or respiratory therapist will set it to a desired goal.  If possible, sit up straight or lean slightly forward. Try not to slouch.  Hold the incentive spirometer in an upright position. INSTRUCTIONS FOR USE  1. Sit on the edge of your bed if possible, or sit up as far as you can in bed or on a chair. 2. Hold the incentive spirometer in an upright position. 3. Breathe out normally. 4. Place the mouthpiece in your mouth and seal your lips tightly around it. 5. Breathe in slowly and as deeply as possible, raising the piston or the ball toward the top of the column. 6. Hold your breath for 3-5 seconds or for as  long as possible. Allow the piston or ball to fall to the bottom of the column. 7. Remove the mouthpiece from your mouth and breathe out normally. 8. Rest for a few seconds and repeat Steps 1 through 7 at least 10 times every 1-2 hours when you are awake. Take your time and take a few normal breaths between deep breaths. 9. The spirometer may include an indicator to show your best effort. Use the indicator as a goal to work toward during each repetition. 10. After each set of 10 deep breaths, practice coughing to be sure your lungs are clear. If you have an incision (the cut made at the time of surgery), support your incision when coughing by placing a pillow or rolled up towels firmly against it. Once you are able to get out of bed, walk around indoors and cough well. You may stop using the incentive spirometer when instructed by your caregiver.  RISKS AND COMPLICATIONS  Take your time so you do not get dizzy or light-headed.  If you are in pain, you may need to take or ask for pain medication before doing incentive spirometry. It is harder to take a deep breath if you are having pain. AFTER USE  Rest and breathe slowly and easily.  It can be helpful to keep track of a log of  your progress. Your caregiver can provide you with a simple table to help with this. If you are using the spirometer at home, follow these instructions: [place] IF:   You are having difficultly using the spirometer.  You have trouble using the spirometer as often as instructed.  Your pain medication is not giving enough relief while using the spirometer.  You develop fever of 100.5 F (38.1 C) or higher. SEEK IMMEDIATE MEDICAL CARE IF:   You cough up bloody sputum that had not been present before.  You develop fever of 102 F (38.9 C) or greater.  You develop worsening pain at or near the incision site. MAKE SURE YOU:   Understand these instructions.  Will watch your condition.  Will get help right away if you are not doing well or get worse. Document Released: 04/14/2007 Document Revised: 02/24/2012 Document Reviewed: 06/15/2007 ExitCare Patient Information 2014 Exit[employer], [place].   ________________________________________________________________________  WHAT IS A BLOOD TRANSFUSION? Blood Transfusion Information  A transfusion is the replacement of blood or some of its parts. Blood is made up of multiple cells which provide different functions.  Red blood cells carry oxygen and are used for blood loss replacement.  White blood cells fight against infection.  Platelets control bleeding.  Plasma helps clot blood.  Other blood products are available for specialized needs, such as hemophilia or other clotting disorders. BEFORE THE TRANSFUSION  Who gives blood for transfusions?   Healthy volunteers who are fully evaluated to make sure their blood is safe. This is blood bank blood. Transfusion therapy is the safest it has ever been in the practice of medicine. Before blood is taken from a donor, a complete history is taken to make sure that person has no history of diseases nor engages in risky social behavior (examples are intravenous drug use or sexual activity  with multiple partners). The donor's travel history is screened to minimize risk of transmitting infections, such as malaria. The donated blood is tested for signs of infectious diseases, such as HIV and hepatitis. The blood is then tested to be sure it is compatible with you in order to minimize the chance  of a transfusion reaction. If you or a relative donates blood, this is often done in anticipation of surgery and is not appropriate for emergency situations. It takes many days to process the donated blood. RISKS AND COMPLICATIONS Although transfusion therapy is very safe and saves many lives, the main dangers of transfusion include:   Getting an infectious disease.  Developing a transfusion reaction. This is an allergic reaction to something in the blood you were given. Every precaution is taken to prevent this. The decision to have a blood transfusion has been considered carefully by your caregiver before blood is given. Blood is not given unless the benefits outweigh the risks. AFTER THE TRANSFUSION  Right after receiving a blood transfusion, you will usually feel much better and more energetic. This is especially true if your red blood cells have gotten low (anemic). The transfusion raises the level of the red blood cells which carry oxygen, and this usually causes an energy increase.  The nurse administering the transfusion will monitor you carefully for complications. HOME CARE INSTRUCTIONS  No special instructions are needed after a transfusion. You may find your energy is better. Speak with your caregiver about any limitations on activity for underlying diseases you may have. SEEK MEDICAL CARE IF:   Your condition is not improving after your transfusion.  You develop redness or irritation at the intravenous (IV) site. SEEK IMMEDIATE MEDICAL CARE IF:  Any of the following symptoms occur over the next 12 hours:  Shaking chills.  You have a temperature by mouth above 102 F (38.9  C), not controlled by medicine.  Chest, back, or muscle pain.  People around you feel you are not acting correctly or are confused.  Shortness of breath or difficulty breathing.  Dizziness and fainting.  You get a rash or develop hives.  You have a decrease in urine output.  Your urine turns a dark color or changes to pink, red, or brown. Any of the following symptoms occur over the next 10 days:  You have a temperature by mouth above 102 F (38.9 C), not controlled by medicine.  Shortness of breath.  Weakness after normal activity.  The white part of the eye turns yellow (jaundice).  You have a decrease in the amount of urine or are urinating less often.  Your urine turns a dark color or changes to pink, red, or brown. Document Released: 11/29/2000 Document Revised: 02/24/2012 Document Reviewed: 07/18/2008 [facility] Patient Information 2014 [place], [place].  _______________________________________________________________________

## 2018-06-17 NOTE — Progress Notes (Signed)
06-16-18 (Epic) Pulmonary clearance from Dr. Melvyn Novas and CXR

## 2018-06-17 NOTE — Assessment & Plan Note (Signed)
D/c acei 05/07/2017 due to cough  > improved  Although even in retrospect it may not be clear the ACEi contributed to the pt's symptoms,  Pt improved off them and adding them back at this point or in the future would risk confusion in interpretation of non-specific respiratory symptoms to which this patient is prone  ie  Better not to muddy the waters here.   I refilled her amlopdipine/ bisoprolol x 3 months but directed her back to PCP for all further refills

## 2018-06-19 ENCOUNTER — Other Ambulatory Visit: Payer: Self-pay

## 2018-06-19 ENCOUNTER — Encounter (HOSPITAL_COMMUNITY)
Admission: RE | Admit: 2018-06-19 | Discharge: 2018-06-19 | Disposition: A | Discharge status: Home / Self Care | Payer: PRIVATE HEALTH INSURANCE | Source: Ambulatory Visit | Attending: Gynecologic Oncology | Admitting: Gynecologic Oncology

## 2018-06-19 ENCOUNTER — Encounter (HOSPITAL_COMMUNITY): Payer: Self-pay

## 2018-06-19 DIAGNOSIS — Z01810 Encounter for preprocedural cardiovascular examination: Secondary | ICD-10-CM | POA: Insufficient documentation

## 2018-06-19 DIAGNOSIS — R9431 Abnormal electrocardiogram [ECG] [EKG]: Secondary | ICD-10-CM | POA: Diagnosis not present

## 2018-06-19 DIAGNOSIS — Z01812 Encounter for preprocedural laboratory examination: Secondary | ICD-10-CM | POA: Insufficient documentation

## 2018-06-19 DIAGNOSIS — C541 Malignant neoplasm of endometrium: Secondary | ICD-10-CM

## 2018-06-19 LAB — COMPREHENSIVE METABOLIC PANEL
ALBUMIN: 3.9 g/dL (ref 3.5–5.0)
ALK PHOS: 117 U/L (ref 38–126)
ALT: 27 U/L (ref 0–44)
AST: 22 U/L (ref 15–41)
Anion gap: 8 (ref 5–15)
BILIRUBIN TOTAL: 0.5 mg/dL (ref 0.3–1.2)
BUN: 22 mg/dL — AB (ref 6–20)
CO2: 29 mmol/L (ref 22–32)
CREATININE: 0.84 mg/dL (ref 0.44–1.00)
Calcium: 9.1 mg/dL (ref 8.9–10.3)
Chloride: 104 mmol/L (ref 98–111)
GFR calc Af Amer: >60 mL/min (ref >60)
GLUCOSE: 140 mg/dL — AB (ref 70–99)
POTASSIUM: 4.2 mmol/L (ref 3.5–5.1)
Sodium: 141 mmol/L (ref 135–145)
TOTAL PROTEIN: 8.2 g/dL — AB (ref 6.5–8.1)

## 2018-06-19 LAB — CBC
HEMATOCRIT: 36.2 % (ref 36.0–46.0)
HEMOGLOBIN: 12.7 g/dL (ref 12.0–15.0)
MCH: 32.1 pg (ref 26.0–34.0)
MCHC: 35.1 g/dL (ref 30.0–36.0)
MCV: 91.4 fL (ref 78.0–100.0)
Platelets: 211 10*3/uL (ref 150–400)
RBC: 3.96 MIL/uL (ref 3.87–5.11)
RDW: 14.1 % (ref 11.5–15.5)
WBC: 7.3 10*3/uL (ref 4.0–10.5)

## 2018-06-19 LAB — URINALYSIS, ROUTINE W REFLEX MICROSCOPIC
Bilirubin Urine: NEGATIVE
GLUCOSE, UA: NEGATIVE mg/dL
Hgb urine dipstick: NEGATIVE
Ketones, ur: NEGATIVE mg/dL
Nitrite: NEGATIVE
PH: 5 (ref 5.0–8.0)
PROTEIN: NEGATIVE mg/dL
Specific Gravity, Urine: 1.021 (ref 1.005–1.030)

## 2018-06-19 LAB — ABO/RH: ABO/RH(D): A POS

## 2018-06-19 NOTE — Progress Notes (Signed)
06-17-18 UA sent to Dr. Denman George for review

## 2018-06-24 NOTE — Anesthesia Preprocedure Evaluation (Signed)
Anesthesia Evaluation  Patient identified by MRN, date of birth, ID band Patient awake    Reviewed: Allergy & Precautions, NPO status , Patient's Chart, lab work & pertinent test results  Airway Mallampati: II  TM Distance: >3 FB Neck ROM: Full    Dental no notable dental hx.    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 [place]    Pulmonary exam normal breath sounds clear to auscultation       Cardiovascular hypertension, Normal cardiovascular exam Rhythm:Regular Rate:Normal     Neuro/Psych negative neurological ROS  negative psych ROS   GI/Hepatic GERD  Medicated,  Endo/Other  Morbid obesity  Renal/GU      Musculoskeletal   Abdominal   Peds  Hematology   Anesthesia Other Findings   Reproductive/Obstetrics                             Anesthesia Physical  Anesthesia Plan  ASA: III  Anesthesia Plan: General   Post-op Pain Management:    Induction: Intravenous  PONV Risk Score and Plan: 3 and Ondansetron, Treatment may vary due to age or medical condition, Dexamethasone and Midazolam  Airway Management Planned: Oral ETT  Additional Equipment:   Intra-op Plan:   Post-operative Plan: Extubation in OR  Informed Consent: I have reviewed the patients History and Physical, chart, labs and discussed the procedure including the risks, benefits and alternatives for the proposed anesthesia with the patient or authorized representative who has indicated his/her understanding and acceptance.   Dental advisory given  Plan Discussed with: CRNA, Surgeon and Anesthesiologist  Anesthesia Plan Comments: (  )        Anesthesia Quick Evaluation

## 2018-06-25 ENCOUNTER — Encounter (HOSPITAL_COMMUNITY): Payer: Self-pay

## 2018-06-25 ENCOUNTER — Ambulatory Visit (HOSPITAL_COMMUNITY): Payer: Self-pay | Admitting: Anesthesiology

## 2018-06-25 ENCOUNTER — Ambulatory Visit (HOSPITAL_COMMUNITY)
Admission: RE | Admit: 2018-06-25 | Discharge: 2018-06-27 | Disposition: A | Discharge status: Home / Self Care | Payer: Self-pay | Source: Ambulatory Visit | Attending: Gynecologic Oncology | Admitting: Gynecologic Oncology

## 2018-06-25 ENCOUNTER — Encounter (HOSPITAL_COMMUNITY)
Admission: RE | Disposition: A | Discharge status: Home / Self Care | Payer: Self-pay | Source: Ambulatory Visit | Attending: Gynecologic Oncology

## 2018-06-25 DIAGNOSIS — Z79899 Other long term (current) drug therapy: Secondary | ICD-10-CM | POA: Insufficient documentation

## 2018-06-25 DIAGNOSIS — Z7952 Long term (current) use of systemic steroids: Secondary | ICD-10-CM | POA: Insufficient documentation

## 2018-06-25 DIAGNOSIS — N870 Mild cervical dysplasia: Secondary | ICD-10-CM | POA: Insufficient documentation

## 2018-06-25 DIAGNOSIS — Z87891 Personal history of nicotine dependence: Secondary | ICD-10-CM | POA: Insufficient documentation

## 2018-06-25 DIAGNOSIS — N83292 Other ovarian cyst, left side: Secondary | ICD-10-CM | POA: Insufficient documentation

## 2018-06-25 DIAGNOSIS — C541 Malignant neoplasm of endometrium: Secondary | ICD-10-CM | POA: Insufficient documentation

## 2018-06-25 DIAGNOSIS — K219 Gastro-esophageal reflux disease without esophagitis: Secondary | ICD-10-CM | POA: Insufficient documentation

## 2018-06-25 DIAGNOSIS — Z923 Personal history of irradiation: Secondary | ICD-10-CM | POA: Insufficient documentation

## 2018-06-25 DIAGNOSIS — D860 Sarcoidosis of lung: Secondary | ICD-10-CM | POA: Insufficient documentation

## 2018-06-25 DIAGNOSIS — Z9221 Personal history of antineoplastic chemotherapy: Secondary | ICD-10-CM | POA: Insufficient documentation

## 2018-06-25 DIAGNOSIS — I10 Essential (primary) hypertension: Secondary | ICD-10-CM | POA: Insufficient documentation

## 2018-06-25 DIAGNOSIS — Z6838 Body mass index (BMI) 38.0-38.9, adult: Secondary | ICD-10-CM

## 2018-06-25 DIAGNOSIS — Z79891 Long term (current) use of opiate analgesic: Secondary | ICD-10-CM | POA: Insufficient documentation

## 2018-06-25 DIAGNOSIS — Z6836 Body mass index (BMI) 36.0-36.9, adult: Secondary | ICD-10-CM | POA: Insufficient documentation

## 2018-06-25 DIAGNOSIS — N83291 Other ovarian cyst, right side: Secondary | ICD-10-CM | POA: Insufficient documentation

## 2018-06-25 DIAGNOSIS — R42 Dizziness and giddiness: Secondary | ICD-10-CM | POA: Insufficient documentation

## 2018-06-25 HISTORY — PX: ROBOTIC ASSISTED TOTAL HYSTERECTOMY WITH BILATERAL SALPINGO OOPHERECTOMY: SHX6086

## 2018-06-25 HISTORY — PX: SENTINEL NODE BIOPSY: SHX6608

## 2018-06-25 LAB — TYPE AND SCREEN
ABO/RH(D): A POS
Antibody Screen: NEGATIVE

## 2018-06-25 SURGERY — HYSTERECTOMY, TOTAL, ROBOT-ASSISTED, LAPAROSCOPIC, WITH BILATERAL SALPINGO-OOPHORECTOMY
Anesthesia: General

## 2018-06-25 MED ORDER — SUGAMMADEX SODIUM 500 MG/5ML IV SOLN
INTRAVENOUS | Status: AC
  Filled 2018-06-25: qty 5

## 2018-06-25 MED ORDER — HYDROMORPHONE HCL 1 MG/ML IJ SOLN
0.2000 mg | INTRAMUSCULAR | Status: DC | PRN
  Administered 2018-06-25: 0.5 mg via INTRAVENOUS
  Filled 2018-06-25: qty 1

## 2018-06-25 MED ORDER — ACETAMINOPHEN 500 MG PO TABS
1000.0000 mg | ORAL_TABLET | Freq: Four times a day (QID) | ORAL | Status: DC
  Administered 2018-06-25 – 2018-06-27 (×6): 1000 mg via ORAL
  Filled 2018-06-25 (×7): qty 2

## 2018-06-25 MED ORDER — OXYCODONE HCL 5 MG PO TABS
5.0000 mg | ORAL_TABLET | ORAL | Status: DC | PRN
  Filled 2018-06-25: qty 1

## 2018-06-25 MED ORDER — ROCURONIUM BROMIDE 100 MG/10ML IV SOLN
INTRAVENOUS | Status: AC
  Filled 2018-06-25: qty 1

## 2018-06-25 MED ORDER — ONDANSETRON HCL 4 MG/2ML IJ SOLN
INTRAMUSCULAR | Status: DC | PRN
  Administered 2018-06-25: 4 mg via INTRAVENOUS

## 2018-06-25 MED ORDER — LIDOCAINE 2% (20 MG/ML) 5 ML SYRINGE
INTRAMUSCULAR | Status: DC | PRN
  Administered 2018-06-25: 1.5 mg/kg/h via INTRAVENOUS

## 2018-06-25 MED ORDER — LIDOCAINE 2% (20 MG/ML) 5 ML SYRINGE
INTRAMUSCULAR | Status: DC | PRN
  Administered 2018-06-25: 100 mg via INTRAVENOUS

## 2018-06-25 MED ORDER — ONDANSETRON HCL 4 MG/2ML IJ SOLN: 4.0000 mg | Freq: Four times a day (QID) | INTRAMUSCULAR | Status: DC | PRN

## 2018-06-25 MED ORDER — DEXAMETHASONE SODIUM PHOSPHATE 10 MG/ML IJ SOLN
INTRAMUSCULAR | Status: DC | PRN
  Administered 2018-06-25: 10 mg via INTRAVENOUS

## 2018-06-25 MED ORDER — KETOROLAC TROMETHAMINE 30 MG/ML IJ SOLN: 15.0000 mg | Freq: Once | INTRAMUSCULAR | Status: DC

## 2018-06-25 MED ORDER — PROPOFOL 10 MG/ML IV BOLUS
INTRAVENOUS | Status: DC | PRN
  Administered 2018-06-25: 150 mg via INTRAVENOUS

## 2018-06-25 MED ORDER — EPHEDRINE SULFATE-NACL 50-0.9 MG/10ML-% IV SOSY
PREFILLED_SYRINGE | INTRAVENOUS | Status: DC | PRN
  Administered 2018-06-25: 10 mg via INTRAVENOUS

## 2018-06-25 MED ORDER — LIDOCAINE HCL 2 % IJ SOLN
INTRAMUSCULAR | Status: AC
  Filled 2018-06-25: qty 20

## 2018-06-25 MED ORDER — SODIUM CHLORIDE 0.9 % IJ SOLN
INTRAMUSCULAR | Status: AC
  Filled 2018-06-25: qty 20

## 2018-06-25 MED ORDER — KETOROLAC TROMETHAMINE 15 MG/ML IJ SOLN
15.0000 mg | Freq: Four times a day (QID) | INTRAMUSCULAR | Status: DC
  Administered 2018-06-25 – 2018-06-27 (×8): 15 mg via INTRAVENOUS
  Filled 2018-06-25 (×9): qty 1

## 2018-06-25 MED ORDER — ONDANSETRON HCL 4 MG/2ML IJ SOLN: 4.0000 mg | Freq: Once | INTRAMUSCULAR | Status: DC

## 2018-06-25 MED ORDER — SODIUM CHLORIDE 0.9 % IJ SOLN
INTRAMUSCULAR | Status: AC
  Filled 2018-06-25: qty 10

## 2018-06-25 MED ORDER — KETAMINE HCL 10 MG/ML IJ SOLN
INTRAMUSCULAR | Status: AC
  Filled 2018-06-25: qty 1

## 2018-06-25 MED ORDER — GABAPENTIN 300 MG PO CAPS
300.0000 mg | ORAL_CAPSULE | ORAL | Status: AC
  Administered 2018-06-25: 300 mg via ORAL
  Filled 2018-06-25: qty 1

## 2018-06-25 MED ORDER — ACETAMINOPHEN 325 MG PO TABS: 325.0000 mg | ORAL_TABLET | ORAL | Status: DC | PRN

## 2018-06-25 MED ORDER — DEXAMETHASONE SODIUM PHOSPHATE 4 MG/ML IJ SOLN: 4.0000 mg | INTRAMUSCULAR | Status: DC

## 2018-06-25 MED ORDER — ALBUTEROL SULFATE (2.5 MG/3ML) 0.083% IN NEBU: 2.5000 mg | INHALATION_SOLUTION | RESPIRATORY_TRACT | Status: DC | PRN

## 2018-06-25 MED ORDER — ACETAMINOPHEN 500 MG PO TABS
1000.0000 mg | ORAL_TABLET | ORAL | Status: AC
  Administered 2018-06-25: 1000 mg via ORAL
  Filled 2018-06-25: qty 2

## 2018-06-25 MED ORDER — IBUPROFEN 200 MG PO TABS: 600.0000 mg | ORAL_TABLET | Freq: Four times a day (QID) | ORAL | Status: DC

## 2018-06-25 MED ORDER — FAMOTIDINE 20 MG PO TABS
20.0000 mg | ORAL_TABLET | Freq: Every day | ORAL | Status: DC
  Administered 2018-06-25 – 2018-06-26 (×2): 20 mg via ORAL
  Filled 2018-06-25 (×2): qty 1

## 2018-06-25 MED ORDER — OXYCODONE HCL 5 MG PO TABS: 5.0000 mg | ORAL_TABLET | Freq: Once | ORAL | Status: DC | PRN

## 2018-06-25 MED ORDER — CLINDAMYCIN PHOSPHATE 900 MG/50ML IV SOLN
900.0000 mg | INTRAVENOUS | Status: AC
  Administered 2018-06-25: 900 mg via INTRAVENOUS
  Filled 2018-06-25: qty 50

## 2018-06-25 MED ORDER — CIPROFLOXACIN IN D5W 400 MG/200ML IV SOLN
400.0000 mg | INTRAVENOUS | Status: AC
  Administered 2018-06-25: 400 mg via INTRAVENOUS
  Filled 2018-06-25: qty 200

## 2018-06-25 MED ORDER — ONDANSETRON HCL 4 MG/2ML IJ SOLN: 4.0000 mg | Freq: Once | INTRAMUSCULAR | Status: DC | PRN

## 2018-06-25 MED ORDER — AMLODIPINE BESYLATE 10 MG PO TABS
10.0000 mg | ORAL_TABLET | Freq: Every day | ORAL | Status: DC
  Administered 2018-06-26 – 2018-06-27 (×2): 10 mg via ORAL
  Filled 2018-06-25 (×2): qty 1

## 2018-06-25 MED ORDER — LACTATED RINGERS IV SOLN
INTRAVENOUS | Status: DC
  Administered 2018-06-25 (×2): via INTRAVENOUS

## 2018-06-25 MED ORDER — SCOPOLAMINE 1 MG/3DAYS TD PT72
1.0000 | MEDICATED_PATCH | TRANSDERMAL | Status: DC
  Administered 2018-06-25: 1.5 mg via TRANSDERMAL
  Filled 2018-06-25: qty 1

## 2018-06-25 MED ORDER — FENTANYL CITRATE (PF) 100 MCG/2ML IJ SOLN: 25.0000 ug | INTRAMUSCULAR | Status: DC | PRN

## 2018-06-25 MED ORDER — OXYCODONE HCL 5 MG/5ML PO SOLN
5.0000 mg | Freq: Once | ORAL | Status: DC | PRN
  Filled 2018-06-25: qty 5

## 2018-06-25 MED ORDER — KETAMINE HCL 10 MG/ML IJ SOLN
INTRAMUSCULAR | Status: DC | PRN
  Administered 2018-06-25: 50 mg via INTRAVENOUS

## 2018-06-25 MED ORDER — ACETAMINOPHEN 160 MG/5ML PO SOLN: 325.0000 mg | ORAL | Status: DC | PRN

## 2018-06-25 MED ORDER — DEXAMETHASONE SODIUM PHOSPHATE 10 MG/ML IJ SOLN
INTRAMUSCULAR | Status: AC
  Filled 2018-06-25: qty 1

## 2018-06-25 MED ORDER — LACTATED RINGERS IR SOLN
Status: DC | PRN
  Administered 2018-06-25: 1000 mL

## 2018-06-25 MED ORDER — STERILE WATER FOR IRRIGATION IR SOLN
Status: DC | PRN
  Administered 2018-06-25: 1000 mL

## 2018-06-25 MED ORDER — SENNOSIDES-DOCUSATE SODIUM 8.6-50 MG PO TABS
2.0000 | ORAL_TABLET | Freq: Every day | ORAL | Status: DC
  Administered 2018-06-25 – 2018-06-26 (×2): 2 via ORAL
  Filled 2018-06-25 (×2): qty 2

## 2018-06-25 MED ORDER — GABAPENTIN 300 MG PO CAPS
600.0000 mg | ORAL_CAPSULE | Freq: Every day | ORAL | Status: AC
  Administered 2018-06-25: 600 mg via ORAL
  Filled 2018-06-25: qty 2

## 2018-06-25 MED ORDER — EPHEDRINE 5 MG/ML INJ
INTRAVENOUS | Status: AC
  Filled 2018-06-25: qty 10

## 2018-06-25 MED ORDER — SUGAMMADEX SODIUM 200 MG/2ML IV SOLN
INTRAVENOUS | Status: DC | PRN
  Administered 2018-06-25: 200 mg via INTRAVENOUS

## 2018-06-25 MED ORDER — PROPOFOL 10 MG/ML IV BOLUS
INTRAVENOUS | Status: AC
  Filled 2018-06-25: qty 20

## 2018-06-25 MED ORDER — STERILE WATER FOR INJECTION IJ SOLN
INTRAMUSCULAR | Status: DC | PRN
  Administered 2018-06-25: 10 mL

## 2018-06-25 MED ORDER — ENOXAPARIN SODIUM 40 MG/0.4ML ~~LOC~~ SOLN
40.0000 mg | SUBCUTANEOUS | Status: DC
  Administered 2018-06-26: 40 mg via SUBCUTANEOUS
  Filled 2018-06-25 (×2): qty 0.4

## 2018-06-25 MED ORDER — ALBUTEROL SULFATE HFA 108 (90 BASE) MCG/ACT IN AERS: 2.0000 | INHALATION_SPRAY | RESPIRATORY_TRACT | Status: DC | PRN

## 2018-06-25 MED ORDER — CELECOXIB 200 MG PO CAPS
400.0000 mg | ORAL_CAPSULE | ORAL | Status: AC
  Administered 2018-06-25: 400 mg via ORAL
  Filled 2018-06-25: qty 2

## 2018-06-25 MED ORDER — KCL IN DEXTROSE-NACL 20-5-0.45 MEQ/L-%-% IV SOLN
INTRAVENOUS | Status: DC
  Administered 2018-06-25: 15:00:00 via INTRAVENOUS
  Administered 2018-06-26: 1000 mL via INTRAVENOUS
  Filled 2018-06-25 (×2): qty 1000

## 2018-06-25 MED ORDER — PREDNISONE 1 MG PO TABS
1.0000 mg | ORAL_TABLET | Freq: Every day | ORAL | Status: DC
  Administered 2018-06-26 – 2018-06-27 (×2): 1 mg via ORAL
  Filled 2018-06-25 (×2): qty 1

## 2018-06-25 MED ORDER — MIDAZOLAM HCL 2 MG/2ML IJ SOLN
INTRAMUSCULAR | Status: DC | PRN
  Administered 2018-06-25: 2 mg via INTRAVENOUS

## 2018-06-25 MED ORDER — ONDANSETRON HCL 4 MG/2ML IJ SOLN
INTRAMUSCULAR | Status: AC
  Filled 2018-06-25: qty 2

## 2018-06-25 MED ORDER — PANTOPRAZOLE SODIUM 40 MG PO TBEC
40.0000 mg | DELAYED_RELEASE_TABLET | Freq: Every day | ORAL | Status: DC
  Administered 2018-06-26 – 2018-06-27 (×2): 40 mg via ORAL
  Filled 2018-06-25 (×2): qty 1

## 2018-06-25 MED ORDER — LIDOCAINE 2% (20 MG/ML) 5 ML SYRINGE
INTRAMUSCULAR | Status: AC
  Filled 2018-06-25: qty 5

## 2018-06-25 MED ORDER — ENOXAPARIN SODIUM 40 MG/0.4ML ~~LOC~~ SOLN
40.0000 mg | SUBCUTANEOUS | Status: AC
  Administered 2018-06-25: 40 mg via SUBCUTANEOUS
  Filled 2018-06-25: qty 0.4

## 2018-06-25 MED ORDER — ONDANSETRON HCL 4 MG PO TABS: 4.0000 mg | ORAL_TABLET | Freq: Four times a day (QID) | ORAL | Status: DC | PRN

## 2018-06-25 MED ORDER — BISOPROLOL FUMARATE 5 MG PO TABS
10.0000 mg | ORAL_TABLET | Freq: Every day | ORAL | Status: DC
  Administered 2018-06-26 – 2018-06-27 (×2): 10 mg via ORAL
  Filled 2018-06-25 (×2): qty 2

## 2018-06-25 MED ORDER — SUFENTANIL CITRATE 50 MCG/ML IV SOLN
INTRAVENOUS | Status: AC
  Filled 2018-06-25: qty 1

## 2018-06-25 MED ORDER — MEPERIDINE HCL 50 MG/ML IJ SOLN: 6.2500 mg | INTRAMUSCULAR | Status: DC | PRN

## 2018-06-25 MED ORDER — SUFENTANIL CITRATE 50 MCG/ML IV SOLN
INTRAVENOUS | Status: DC | PRN
  Administered 2018-06-25: 20 ug via INTRAVENOUS
  Administered 2018-06-25: 10 ug via INTRAVENOUS

## 2018-06-25 MED ORDER — ROCURONIUM BROMIDE 100 MG/10ML IV SOLN
INTRAVENOUS | Status: DC | PRN
  Administered 2018-06-25: 50 mg via INTRAVENOUS
  Administered 2018-06-25: 10 mg via INTRAVENOUS

## 2018-06-25 MED ORDER — ONDANSETRON HCL 4 MG/2ML IJ SOLN
4.0000 mg | Freq: Once | INTRAMUSCULAR | Status: AC
  Administered 2018-06-25: 4 mg via INTRAVENOUS
  Filled 2018-06-25: qty 2

## 2018-06-25 SURGICAL SUPPLY — 49 items
APPLICATOR SURGIFLO ENDO (HEMOSTASIS) IMPLANT
BAG LAPAROSCOPIC 12 15 PORT 16 (BASKET) IMPLANT
BAG RETRIEVAL 12/15 (BASKET)
CATH FOLEY 2WAY SLVR  5CC 14FR (CATHETERS) ×1
CATH FOLEY 2WAY SLVR 5CC 14FR (CATHETERS) ×2 IMPLANT
COVER BACK TABLE 60X90IN (DRAPES) ×3 IMPLANT
COVER TIP SHEARS 8 DVNC (MISCELLANEOUS) ×2 IMPLANT
COVER TIP SHEARS 8MM DA VINCI (MISCELLANEOUS) ×1
DECANTER SPIKE VIAL GLASS SM (MISCELLANEOUS) ×3 IMPLANT
DERMABOND ADVANCED (GAUZE/BANDAGES/DRESSINGS) ×1
DERMABOND ADVANCED .7 DNX12 (GAUZE/BANDAGES/DRESSINGS) ×2 IMPLANT
DRAPE ARM DVNC X/XI (DISPOSABLE) ×8 IMPLANT
DRAPE COLUMN DVNC XI (DISPOSABLE) ×2 IMPLANT
DRAPE DA VINCI XI ARM (DISPOSABLE) ×4
DRAPE DA VINCI XI COLUMN (DISPOSABLE) ×1
DRAPE SHEET LG 3/4 BI-LAMINATE (DRAPES) ×3 IMPLANT
DRAPE SURG IRRIG POUCH 19X23 (DRAPES) ×3 IMPLANT
ELECT REM PT RETURN 15FT ADLT (MISCELLANEOUS) ×3 IMPLANT
GLOVE BIO SURGEON STRL SZ 6 (GLOVE) ×12 IMPLANT
GLOVE BIO SURGEON STRL SZ 6.5 (GLOVE) ×6 IMPLANT
GOWN STRL REUS W/ TWL LRG LVL3 (GOWN DISPOSABLE) ×6 IMPLANT
GOWN STRL REUS W/TWL LRG LVL3 (GOWN DISPOSABLE) ×3
HOLDER FOLEY CATH W/STRAP (MISCELLANEOUS) ×3 IMPLANT
IRRIG SUCT STRYKERFLOW 2 WTIP (MISCELLANEOUS) ×3
IRRIGATION SUCT STRKRFLW 2 WTP (MISCELLANEOUS) ×2 IMPLANT
KIT PROCEDURE DA VINCI SI (MISCELLANEOUS) ×1
KIT PROCEDURE DVNC SI (MISCELLANEOUS) ×2 IMPLANT
MANIPULATOR UTERINE 4.5 ZUMI (MISCELLANEOUS) ×3 IMPLANT
NEEDLE SPNL 18GX3.5 QUINCKE PK (NEEDLE) ×3 IMPLANT
OBTURATOR OPTICAL STANDARD 8MM (TROCAR) ×1
OBTURATOR OPTICAL STND 8 DVNC (TROCAR) ×2
OBTURATOR OPTICALSTD 8 DVNC (TROCAR) ×2 IMPLANT
PACK ROBOT GYN CUSTOM WL (TRAY / TRAY PROCEDURE) ×3 IMPLANT
PAD POSITIONING PINK XL (MISCELLANEOUS) ×3 IMPLANT
PORT ACCESS TROCAR AIRSEAL 12 (TROCAR) ×2 IMPLANT
PORT ACCESS TROCAR AIRSEAL 5M (TROCAR) ×1
POUCH SPECIMEN RETRIEVAL 10MM (ENDOMECHANICALS) IMPLANT
SEAL CANN UNIV 5-8 DVNC XI (MISCELLANEOUS) ×8 IMPLANT
SEAL XI 5MM-8MM UNIVERSAL (MISCELLANEOUS) ×4
SET TRI-LUMEN FLTR TB AIRSEAL (TUBING) ×3 IMPLANT
SURGIFLO W/THROMBIN 8M KIT (HEMOSTASIS) IMPLANT
SUT VIC AB 0 CT1 27 (SUTURE)
SUT VIC AB 0 CT1 27XBRD ANTBC (SUTURE) IMPLANT
SUT VIC AB 3-0 CT1 36 (SUTURE) ×3 IMPLANT
SYR 10ML LL (SYRINGE) ×3 IMPLANT
TOWEL OR NON WOVEN STRL DISP B (DISPOSABLE) ×3 IMPLANT
TRAP SPECIMEN MUCOUS 40CC (MISCELLANEOUS) IMPLANT
TRAY FOLEY MTR SLVR 16FR STAT (SET/KITS/TRAYS/PACK) ×3 IMPLANT
UNDERPAD 30X30 (UNDERPADS AND DIAPERS) ×3 IMPLANT

## 2018-06-25 NOTE — Transfer of Care (Signed)
Immediate Anesthesia Transfer of Care Note  Patient: [name]  Procedure(s) Performed: XI ROBOTIC ASSISTED TOTAL HYSTERECTOMY WITH BILATERAL SALPINGO OOPHORECTOMY (Bilateral ) SENTINEL NODE BIOPSY (N/A )  Patient Location: PACU  Anesthesia Type:General  Level of Consciousness: awake and oriented  Airway & Oxygen Therapy: Patient Spontanous Breathing and Patient connected to face mask oxygen  Post-op Assessment: Report given to RN and Post -op Vital signs reviewed and stable  Post vital signs: Reviewed and stable  Last Vitals:  Vitals Value Taken Time  BP    Temp    Pulse 61 06/25/2018 12:59 PM  Resp 14 06/25/2018 12:58 PM  SpO2 96 % 06/25/2018 12:59 PM  Vitals shown include unvalidated device data.  Last Pain:  Vitals:   06/25/18 0822  TempSrc:   PainSc: 0-No pain         Complications: No apparent anesthesia complications

## 2018-06-25 NOTE — Interval H&P Note (Signed)
History and Physical Interval Note:  06/25/2018 10:26 AM  [name]  has presented today for surgery, with the diagnosis of ENDOMETRIAL CANCER  The various methods of treatment have been discussed with the patient and family. After consideration of risks, benefits and other options for treatment, the patient has consented to  Procedure(s): XI ROBOTIC ASSISTED TOTAL HYSTERECTOMY WITH BILATERAL SALPINGO OOPHORECTOMY (Bilateral) SENTINEL NODE BIOPSY (N/A) as a surgical intervention .  The patient's history has been reviewed, patient examined, no change in status, stable for surgery.  I have reviewed the patient's chart and labs.  Questions were answered to the patient's satisfaction.     [name]

## 2018-06-25 NOTE — Progress Notes (Signed)
janine on 5W notified pt will be in room in 20 minutes

## 2018-06-25 NOTE — Anesthesia Postprocedure Evaluation (Signed)
Anesthesia Post Note  Patient: Myrle Wanek  Procedure(s) Performed: XI ROBOTIC ASSISTED TOTAL HYSTERECTOMY WITH BILATERAL SALPINGO OOPHORECTOMY (Bilateral ) SENTINEL NODE BIOPSY (N/A )     Patient location during evaluation: PACU Anesthesia Type: General Level of consciousness: awake and alert Pain management: pain level controlled Vital Signs Assessment: post-procedure vital signs reviewed and stable Respiratory status: spontaneous breathing, nonlabored ventilation, respiratory function stable and patient connected to nasal cannula oxygen Cardiovascular status: blood pressure returned to baseline and stable Postop Assessment: no apparent nausea or vomiting Anesthetic complications: no    Last Vitals:  Vitals:   06/25/18 1510 06/25/18 1610  BP: 134/80 130/82  Pulse: 66 72  Resp: 16 15  Temp: 36.6 C 36.7 C  SpO2: 96% 99%    Last Pain:  Vitals:   06/25/18 2012  TempSrc:   PainSc: 3                  Tanaisha Pittman

## 2018-06-25 NOTE — Op Note (Signed)
OPERATIVE NOTE 06/25/18  Surgeon: Donaciano Eva   Assistants: Dr Lahoma Crocker (an MD assistant was necessary for tissue manipulation, management of robotic instrumentation, retraction and positioning due to the complexity of the case and hospital policies).   Anesthesia: General endotracheal anesthesia  ASA Class: 3   Pre-operative Diagnosis: endometrial cancer grade 1  Post-operative Diagnosis: same,   Operation: Robotic-assisted laparoscopic total hysterectomy with bilateral salpingoophorectomy, SLN biopsy   Surgeon: Donaciano Eva  Assistant Surgeon: Lahoma Crocker MD  Anesthesia: GET  Urine Output: 300cc  Operative Findings:  : 6cm uterus, normal tubes and ovaries, omentum adherent to the anterior abdominal wall and uterine fundus. No suspicious nodes.  Estimated Blood Loss:  less than 50 mL      Total IV Fluids: 500 ml         Specimens: uterus, cervix, bilateral tubes and ovaries, right obturator SLN, left deep obturator SLN         Complications:  None; patient tolerated the procedure well.         Disposition: PACU - hemodynamically stable.  Procedure Details  The patient was seen in the Holding Room. The risks, benefits, complications, treatment options, and expected outcomes were discussed with the patient.  The patient concurred with the proposed plan, giving informed consent.  The site of surgery properly noted/marked. The patient was identified as [name] and the procedure verified as a Robotic-assisted hysterectomy with bilateral salpingo oophorectomy with SLN biopsy. A Time Out was held and the above information confirmed.  After induction of anesthesia, the patient was draped and prepped in the usual sterile manner. Pt was placed in supine position after anesthesia and draped and prepped in the usual sterile manner. The abdominal drape was placed after the CholoraPrep had been allowed to dry for 3 minutes.  Her arms were tucked to  her side with all appropriate precautions.  The shoulders were stabilized with padded shoulder blocks applied to the acromium processes.  The patient was placed in the semi-lithotomy position in [place].  The perineum was prepped with Betadine. The patient was then prepped. Foley catheter was placed.  A sterile speculum was placed in the vagina.  The cervix was grasped with a single-tooth tenaculum. 2mg  total of ICG was injected into the cervical stroma at 2 and 9 o'clock with 1cc injected at a 1cm and 87mm depth (concentration 0.5mg /ml) in all locations. The cervix was dilated with Kennon Rounds dilators.  The ZUMI uterine manipulator with a medium colpotomizer ring was placed without difficulty.  A pneum occluder balloon was placed over the manipulator.  OG tube placement was confirmed and to suction.   Next, a 5 mm skin incision was made 1 cm below the subcostal margin in the midclavicular line.  The 5 mm Optiview port and scope was used for direct entry.  Opening pressure was under 10 mm CO2.  The abdomen was insufflated and the findings were noted as above.   At this point and all points during the procedure, the patient's intra-abdominal pressure did not exceed 15 mmHg. Next, a 10 mm skin incision was made in the umbilicus and a right and left port was placed about 10 cm lateral to the robot port on the right and left side.  A fourth arm was placed in the left lower quadrant 2 cm above and superior and medial to the anterior superior iliac spine.  All ports were placed under direct visualization.  The patient was placed in steep Trendelenburg.  Sharp dissection was employed to take the omental adhesions down from the anterior abdominal wall. Bowel was folded away into the upper abdomen.  The robot was docked in the normal manner.  The omentum was taken down from the uterine fundus with sharp and bipolar dissection. right and left peritoneum were opened parallel to the IP ligament to open the retroperitoneal  spaces bilaterally. The SLN mapping was performed in bilateral pelvic basins. The para rectal and paravesical spaces were opened up entirely with careful dissection below the level of the ureters bilaterally and to the depth of the uterine artery origin in order to skeletonize the uterine "web" and ensure visualization of all parametrial channels. The para-aortic basins were carefully exposed and evaluated for isolated para-aortic SLN's. Lymphatic channels were identified travelling to the following visualized sentinel lymph node's: right obturator SLN, left deep obturator. These SLN's were separated from their surrounding lymphatic tissue, removed and sent for permanent pathology.  The hysterectomy was started after the round ligament on the right side was incised and the retroperitoneum was entered and the pararectal space was developed.  The ureter was noted to be on the medial leaf of the broad ligament.  The peritoneum above the ureter was incised and stretched and the infundibulopelvic ligament was skeletonized, cauterized and cut.  The posterior peritoneum was taken down to the level of the KOH ring.  The anterior peritoneum was also taken down.  The bladder flap was created to the level of the KOH ring.  The uterine artery on the right side was skeletonized, cauterized and cut in the normal manner.  A similar procedure was performed on the left.  The colpotomy was made and the uterus, cervix, bilateral ovaries and tubes were amputated and delivered through the vagina.  Pedicles were inspected and excellent hemostasis was achieved.    The colpotomy at the vaginal cuff was closed with Vicryl on a CT1 needle in a running manner.  An additional figure of 8 suture was placed at the right vaginal corner. Irrigation was used and excellent hemostasis was achieved.  At this point in the procedure was completed.  Robotic instruments were removed under direct visulaization.  The robot was undocked. The 10 mm ports  were closed with Vicryl on a UR-5 needle and the fascia was closed with 0 Vicryl on a UR-5 needle.  The skin was closed with 4-0 Vicryl in a subcuticular manner.  Dermabond was applied.  Sponge, lap and needle counts correct x 2.  The patient was taken to the recovery room in stable condition.  The vagina was swabbed with  minimal bleeding noted.   All instrument and needle counts were correct x  3.   The patient was transferred to the recovery room in a stable condition.  Donaciano Eva, MD

## 2018-06-25 NOTE — Anesthesia Procedure Notes (Signed)
Procedure Name: Intubation Date/Time: 06/25/2018 10:54 AM Performed by: Sharlette Dense, CRNA Patient Re-evaluated:Patient Re-evaluated prior to induction Oxygen Delivery Method: Circle system utilized Preoxygenation: Pre-oxygenation with 100% oxygen Induction Type: IV induction Ventilation: Mask ventilation without difficulty and Oral airway inserted - appropriate to patient size Laryngoscope Size: Sabra Heck and 2 Grade View: Grade I Tube type: Oral Tube size: 7.5 mm Number of attempts: 1 Airway Equipment and Method: Stylet Placement Confirmation: ETT inserted through vocal cords under direct vision,  positive ETCO2 and breath sounds checked- equal and bilateral Secured at: 21 cm Tube secured with: Tape Dental Injury: Teeth and Oropharynx as per pre-operative assessment

## 2018-06-26 ENCOUNTER — Encounter (HOSPITAL_COMMUNITY): Payer: Self-pay | Admitting: Gynecologic Oncology

## 2018-06-26 LAB — BASIC METABOLIC PANEL
Anion gap: 9 (ref 5–15)
BUN: 20 mg/dL (ref 6–20)
CHLORIDE: 101 mmol/L (ref 98–111)
CO2: 25 mmol/L (ref 22–32)
Calcium: 8.9 mg/dL (ref 8.9–10.3)
Creatinine, Ser: 0.96 mg/dL (ref 0.44–1.00)
GFR calc Af Amer: >60 mL/min (ref >60)
GFR calc non Af Amer: >60 mL/min (ref >60)
GLUCOSE: 195 mg/dL — AB (ref 70–99)
POTASSIUM: 4.6 mmol/L (ref 3.5–5.1)
Sodium: 135 mmol/L (ref 135–145)

## 2018-06-26 LAB — CBC
HEMATOCRIT: 32.8 % — AB (ref 36.0–46.0)
Hemoglobin: 11.8 g/dL — ABNORMAL LOW (ref 12.0–15.0)
MCH: 32.2 pg (ref 26.0–34.0)
MCHC: 36 g/dL (ref 30.0–36.0)
MCV: 89.4 fL (ref 78.0–100.0)
PLATELETS: 223 10*3/uL (ref 150–400)
RBC: 3.67 MIL/uL — AB (ref 3.87–5.11)
RDW: 13.6 % (ref 11.5–15.5)
WBC: 9.2 10*3/uL (ref 4.0–10.5)

## 2018-06-26 LAB — HEMOGLOBIN AND HEMATOCRIT, BLOOD
HCT: 30.6 % — ABNORMAL LOW (ref 36.0–46.0)
Hemoglobin: 10.8 g/dL — ABNORMAL LOW (ref 12.0–15.0)

## 2018-06-26 MED ORDER — OXYCODONE HCL 5 MG PO TABS: 5.0000 mg | ORAL_TABLET | ORAL | 0 refills | Status: DC | PRN

## 2018-06-26 NOTE — Discharge Instructions (Addendum)
06/26/2018  Return to work: 4-6 weeks if applicable  Activity: 1. Be up and out of the bed during the day.  Take a nap if needed.  You may walk up steps but be careful and use the hand rail.  Stair climbing will tire you more than you think, you may need to stop part way and rest.   2. No lifting or straining for 6 weeks.  3. No driving for 1 week(s).  Do not drive if you are taking narcotic pain medicine.  4. Shower daily.  Use soap and water on your incision and pat dry; don't rub.  No tub baths until cleared by your surgeon.  Planning for Recovery and Going Home Your Guide to Gynecologic Surgery     In-Hospital Recovery Plan Team Caring for You After Surgery In addition to the nursing staff on the unit, the gynecological surgery team will care for you. This team is led by your surgeon and includes medical students and a physician assistant or nurse practitioner. There will be a physician in the hospital 24 hours a day to tend to your needs. The students report directly to your surgeon, who is the one overseeing all of your care.  Pain Relief After Surgery Your pain will be assessed regularly on a scale from 0 to 10. Pain assessment is  necessary to guide your pain relief. It is essential that you are able to take deep breaths, cough and move. Prevention or early treatment of pain is far more effective than trying to treat severe pain. Therefore, we have devised a specialized regimen to stay ahead of your pain and use almost no narcotics, which can slow down your recovery process. If you have an epidural catheter, you will receive a  constant infusion of pain medication through your epidural. If you need additional pain relief, you will be able to push a button to increase the medication in your epidural. You will also be given acetaminophen and an ibuprofen-like medication to keep your pain under control.  You can always ask for additional pain pills if you are not comfortable.  In most cases an anesthesiologist with expertise in pain management will visit you every day and help design your pain management plan.  One Day After Surgery Focus on drinking and walking. You will start drinking clear liquids after surgery. The intravenous fluids will be stopped, and the catheter may be removed  from your bladder. We expect you to get out of bed, with the nurses' or assistants' help, sit in a chair for six hours and start to move about in the hallways. You will also meet with a case manager to assess your discharge needs, including home nursing. Your physician may order home care to assist with your transition home.  Home nursing visits, which are intermittent, help you get readjusted to home by teaching treatments, monitoring medications, and performing clinical assessment and reporting back to your physician. Other services may include therapy and medical equipment; private duty services are also available. If you are going "home" to a different address upon discharge, please alert [place]. A Home Care Coordinator can visit with you while in the hospital to discuss your options. If you have questions please speak with your case manager. If you need rehabilitation at a facility, a social worker will assist with this. If you need rehabilitation at a facility, a social worker will assist with this. If your procedure was performed in a minimally invasive fashion, you will be discharged to  home if your pain is well controlled and you are tolerating a regular diet.     Two Days After Surgery You will start eating a soft diet and change to a more solid diet as you feel up to it. The catheter from your bladder will be removed, if not already done so. If there is a dressing on your wound, it will be removed. The tubing will be disconnected from your IV. We expect you to be out of bed for the majority of the day and walking at least three times in the hallway, with assistance as needed.   You may be discharged at this point if it is felt you are ready.   Three Days After Surgery You continue to eat your low residue diet. You may be ready to go home if you are drinking enough to keep yourself hydrated, your pain is well controlled, you are not belching or nauseated, you are passing gas and you are able to get around on your own. However, we will not discharge you from the hospital until we are sure you are ready.  Discharge Discharge time is at 10 a.m. You will need to make arrangements for someone to accompany you home. You will not be released without someone present. Please keep in mind that we strive to get patients discharged as quickly as possible, but there may be delays for a variety of reasons. Complications That May Delay Discharge: ? Nausea and vomiting: It is very common to feel sick after your surgery. We give you medication to reduce this. However, if you do feel sick, you should reduce the amount you are taking by mouth. Small, frequent meals or drinks are best in this  situation. As long as you can drink and keep yourself hydrated, the nausea will likely pass.  Ileus: Following surgery, the bowel can be sluggish, making it difficult for food and gas to pass through the intestines. This is called an ileus. We have designed our care program to do everything possible to reduce the likelihood of an ileus. If you do develop an ileus, it usually only lasts two to three days. However, it may require a small tube down the nose to decompress the stomach. The best way to avoid an ileus is to reduce the amount of narcotic pain medications, get up as much as possible after your surgery, and stimulate the bowel early after surgery with small amounts of food and liquids.  Wound infection: If a wound infection develops, this usually happens three to ten days after surgery.    Urinary retention: This is if you are unable to urinate after the catheter from your bladder  is removed. The catheter may need to be reinserted until you are able to urinate on your own. This can be caused by anesthesia, pain medication and decreased activity.    When you are preparing to go home, you will receive:  Detailed discharge instructions, with information about your operation and medications    All prescriptions for medications you need at home; prescriptions can be filled while you are in the hospital if you would like    You may be prescribed Lovenox. Lovenox is used to reduce the risk of developing a blood clot after surgery. An appointment to see your surgeon or provider one to two weeks after you leave the hospital for follow-up   After Discharge Once you are discharged: Call us at any time if you are worried about your recovery or if you should  have any questions. During regular office hours, (8:30 a.m.-4:30 p.m.), and after hours call [phone].  Call us immediately if:  You have a fever higher than 100.4 degrees.   Your wound is red, more painful or has drainage.    You are nauseated, vomiting or can't keep liquids down.    Your pain is worse and not able to be controlled with the regimen you were sent home with.    If you are bleeding heavily or have a lot of fluid coming from your vagina. If you are on narcotics, the goal is to wean you off of them. If you are running low on supply and need more, call the nurse a few days before you will run out.  It is generally easier to reach someone between 8:30 a.m. - 4:30 p.m., so call early if you think something is not right. A nurse or nurse practitioner is available every day to answer your questions. After hours and on the weekends, the calls go to the resident doctors in the hospital. It may take longer for your phone call to be returned during this time. If you have a true emergency, such as severe abdominal pain, chest pain, shortness of breath or any other acute issues, call 911 and go to the  local emergency room. Have them contact our team once you are stable.  Concerns After Discharge Bowel Function Following Your Surgery Your bowels will take several weeks to settle down and may be unpredictable at first. Your bowel movements may become loose, or you may be constipated. For the vast number of patients, this will get back to normal with time. Make sure you eat nutritious meals, drink plenty of fluids and take regular walks during the first two weeks after your operation. Your Guide to Gynecologic Surgery    Abdominal Pain It is not unusual to suffer gripping pains (colic) during the first week following removal of a portion of your bowel. This pain usually lasts for a few minutes but goes away between spasms. If you have severe pain lasting more than one to two hours or have a fever and feel generally unwell, you should contact us at  the telephone contact numbers listed at the end of this packet. Hysterectomy: You should have pelvic rest for six (6) weeks or as specified by your doctor after surgery. You should have nothing in the vagina (no tampons, douching, intercourse, etc.,) during this time period. If you have some vaginal spotting, this is normal. If you have heavy bleeding or  a lot of fluid from your vagina, this is NOT normal and you should contact your doctor's office or, if after hours, contact the doctor on call.  Diarrhea: Fiber and Imodium (Loperamide) The first step to improving your frequent or loose stools is to bulk up the stool with fiber. Metamucil is the most common type of fiber that is available at any drug store. Start with 1 teaspoon mixed into food, like yogurt or oatmeal, in the morning and evening. Try not to drink any fluid for one hour after you take the fiber. This will allow the fiber to act like a sponge in your intestines, soaking up all the excess water. Continue this for three to five days. You may increase by 1 teaspoon every three  to five days until the desired affect, or you are at 1 tablespoon (3 teaspoons) twice a day. If this doesn't work, you may try over-the-counter Loperamide, which is an antidiarrheal medication. You  may take one tablet in the morning and evening or 30 minutes before you typically have diarrhea. You may take up to eight of these tablets daily. It is best to discuss this with [place] prior to using this medication. If you have continuous diarrhea and abdominal cramping, call [phone].  Foley Catheter Your surgeon may recommend you be discharged home with a foley catheter (bladder catheter) for 1 to 2 weeks. Typically this recommendation will be made for patients undergoing surgery to the lower urinary tract. Before you leave the hospital, your nurse should outfit you with a clip on the inner thigh to secure the catheter to prevent pulling as well as a small bag that can be easily worn on the upper leg under loose fitting pants and skirts. Your nurse will teach you how to exchange the large bag that typically comes with the catheter for the small bag. You may find it convenient to attach the small bag when active during the day and then the large bag when sleeping at night. If there is ever a point when you notice the catheter is not draining urine and youbegin to develop pain behind/above the pubic bone, you should report to the clinic or emergency room immediately as the catheter may be kinked or clogged. Kinking or clogging of the catheter prevents urine from draining from your bladder. Urine will quickly build up in the bladder and can cause severe pain as well as seriously disrupt healing if you have undergone surgery on the lower urinary tract. Additionally, pulling on the catheter can result in displacement of the balloon at the end of the catheter from inside of to outside  of the bladder. This also results in severe pain and can cause bleeding. For this reason, secure the catheter to the clip  on your inner thigh at all times as the clip prevents against pulling.  Wound Care For the first few weeks following surgery, your wound may be slightly red and uncomfortable. You may shower and let the soapy water wash over your incision. Avoid soaking in the tub for one month following surgery or until the wound is well healed. It will take the wound several months to "soften." It is common to have bumpy areas in the wound near the belly  button and at the ends of the incision.  If you have staples, these should be removed when you are seen by your surgeon at the follow-up appointment. You may have a glue-like material on your incision. Do not pick at this. It will come off over time. It is the surgical glue used in surgery to close your incision. You also have sutures inside of you that will dissolve over time  Post-Surgery Diet Attention to good nutrition after surgery is important to your recovery. If you had no dietary restrictions prior to the surgery, you will have no special dietary restrictions after the surgery. However, consuming enough protein, calories, vitamins and minerals is necessary to support healing. Some patients find their appetite is less than normal after surgery. In this case, frequent small meals throughout the day may help. It is not uncommon to lose 10 to 15 pounds after surgery. However, by the fourth to fifth week, your weight loss should stabilize. It is normal that certain foods taste different and certain smells may make you nauseas. Over time, the amount you can comfortably consume will gradually increase. You should try to eat a balanced diet, which includes:  Foods that are soft, moist, and  easy to chew and swallow    Canned or soft-cooked fruits and vegetables   Plenty of soft breads, rice, pasta, potatoes and other starchy foods (lowerfiber  varieties may be tolerated better initially)    High-protein foods and beverages, such as meats,  eggs, milk, cottage cheese  or a supplemental nutrition drink like Boost or Ensure    Drink plenty of fluids-at least 8 to 10 cups per day. This includes water,  fruit juice, Gatorade, teas/coffee and milk. Drinking plenty is especially important if you have loose stools (diarrhea).   Avoid drinking a lot of caffeine, since this may dehydrate you.    Avoid fried, greasy and highly seasoned or spicy foods.    Avoid carbonated beverages in the first couple weeks.    Avoid raw fruits and vegetables.   Hobbies/Activities Walking is encouraged after your surgery. You should plan to undertake regular exercise several times a day and gradually increase this during the four weeks following your operation until you are back to your normal level of activity. You may climb stairs. Don't do any heavy lifting greater than 10 pounds or contact sports for the first month after your surgery. Generally, you can return to hobbies and activities soon after your surgery. This will help you recover. It can take up to two to three months to fully recover. It is not unusual to be fatigued and require an afternoon nap for up to six to eight weeks following surgery. Your body is using this energy to heal your wounds. Set small goals for yourself and try to do a little more each day.  Work It is normal to return to work three to six weeks following your operation. If your job involves heavy manual work, then you should wait six weeks. However, you should check with your employer regarding rules, which may be relevant to your return to work. If you need a return-to-work form for your employer or disability papers, bring them to your follow-up appointment or fax them to our office at [phone].  Driving You may drive when you are off narcotics and pain-free enough to react quickly with your braking foot. For most patients, this occurs at one to four weeks following surgery.   Write down any  questions you may have to ask your care team.  Important Contact Numbers: GYN Oncology Office: [phone] 5. No sexual activity and nothing in the vagina for 8 weeks.  6. You may experience a small amount of clear drainage from your incisions, which is normal.  If the drainage persists or increases, please call the office.  7. You may experience vaginal spotting after surgery or around the 6-8 week mark from surgery when the stitches at the top of the vagina begin to dissolve.  The spotting is normal but if you experience heavy bleeding, call our office.  8. Take Tylenol or ibuprofen first for pain and only use oxycodone for severe pain not relieved by the Tylenol or Ibuprofen.  Monitor your Tylenol intake to a max of 4,000 mg a day.  Diet: 1. Low sodium Heart Healthy Diet is recommended.  2. It is safe to use a laxative, such as Miralax or Colace, if you have difficulty moving your bowels. You can take Sennakot at bedtime every evening to keep bowel movements regular and to prevent constipation.    Wound Care: 1. Keep clean and dry.  Shower daily.  Reasons to call the Doctor:  Fever - Oral temperature greater than  100.4 degrees Fahrenheit  Foul-smelling vaginal discharge  Difficulty urinating  Nausea and vomiting  Increased pain at the site of the incision that is unrelieved with pain medicine.  Difficulty breathing with or without chest pain  New calf pain especially if only on one side  Sudden, continuing increased vaginal bleeding with or without clots.   Contacts: For questions or concerns you should contact:  Dr. Everitt Amber at [phone]  Joylene John, NP at [phone]  After Hours: call [phone] and have the GYN Oncologist paged/contacted  Oxycodone tablets or capsules What is this medicine? OXYCODONE (ox i KOE done) is a pain reliever. It is used to treat moderate to severe pain. This medicine may be used for other purposes; ask your health care  provider or pharmacist if you have questions. COMMON BRAND NAME(S): Dazidox, Endocodone, Oxaydo, OXECTA, OxyIR, Percolone, Roxicodone, ROXYBOND What should I tell my health care provider before I take this medicine? They need to know if you have any of these conditions: -Addison's disease -brain tumor -head injury -heart disease -history of drug or alcohol abuse problem -if you often drink alcohol -kidney disease -liver disease -lung or breathing disease, like asthma -mental illness -pancreatic disease -seizures -thyroid disease -an unusual or allergic reaction to oxycodone, codeine, hydrocodone, morphine, other medicines, foods, dyes, or preservatives -pregnant or trying to get pregnant -breast-feeding How should I use this medicine? Take this medicine by mouth with a glass of water. Follow the directions on the prescription label. You can take it with or without food. If it upsets your stomach, take it with food. Take your medicine at regular intervals. Do not take it more often than directed. Do not stop taking except on your doctor's advice. Some brands of this medicine, like Oxecta, have special instructions. Ask your doctor or pharmacist if these directions are for you: Do not cut, crush or chew this medicine. Swallow only one tablet at a time. Do not wet, soak, or lick the tablet before you take it. A special MedGuide will be given to you by the pharmacist with each prescription and refill. Be sure to read this information carefully each time. Talk to your pediatrician regarding the use of this medicine in children. Special care may be needed. Overdosage: If you think you have taken too much of this medicine contact a poison control center or emergency room at once. NOTE: This medicine is only for you. Do not share this medicine with others. What if I miss a dose? If you miss a dose, take it as soon as you can. If it is almost time for your next dose, take only that dose. Do not  take double or extra doses. What may interact with this medicine? This medicine may interact with the following medications: -alcohol -antihistamines for allergy, cough and cold -antiviral medicines for HIV or AIDS -atropine -certain antibiotics like clarithromycin, erythromycin, linezolid, rifampin -certain medicines for anxiety or sleep -certain medicines for bladder problems like oxybutynin, tolterodine -certain medicines for depression like amitriptyline, fluoxetine, sertraline -certain medicines for fungal infections like ketoconazole, itraconazole, voriconazole -certain medicines for migraine headache like almotriptan, eletriptan, frovatriptan, naratriptan, rizatriptan, sumatriptan, zolmitriptan -certain medicines for nausea or vomiting like dolasetron, ondansetron, palonosetron -certain medicines for Parkinson's disease like benztropine, trihexyphenidyl -certain medicines for seizures like phenobarbital, phenytoin, primidone -certain medicines for stomach problems like dicyclomine, hyoscyamine -certain medicines for travel sickness like scopolamine -diuretics -general anesthetics like halothane, isoflurane, methoxyflurane, propofol -ipratropium -local anesthetics like lidocaine, pramoxine, tetracaine -MAOIs like Carbex, Eldepryl, Marplan,  Nardil, and Parnate -medicines that relax muscles for surgery -methylene blue -nilotinib -other narcotic medicines for pain or cough -phenothiazines like chlorpromazine, mesoridazine, prochlorperazine, thioridazine This list may not describe all possible interactions. Give your health care provider a list of all the medicines, herbs, non-prescription drugs, or dietary supplements you use. Also tell them if you smoke, drink alcohol, or use illegal drugs. Some items may interact with your medicine. What should I watch for while using this medicine? Tell your doctor or health care professional if your pain does not go away, if it gets worse, or if  you have new or a different type of pain. You may develop tolerance to the medicine. Tolerance means that you will need a higher dose of the medicine for pain relief. Tolerance is normal and is expected if you take this medicine for a long time. Do not suddenly stop taking your medicine because you may develop a severe reaction. Your body becomes used to the medicine. This does NOT mean you are addicted. Addiction is a behavior related to getting and using a drug for a non-medical reason. If you have pain, you have a medical reason to take pain medicine. Your doctor will tell you how much medicine to take. If your doctor wants you to stop the medicine, the dose will be slowly lowered over time to avoid any side effects. There are different types of narcotic medicines (opiates). If you take more than one type at the same time or if you are taking another medicine that also causes drowsiness, you may have more side effects. Give your health care provider a list of all medicines you use. Your doctor will tell you how much medicine to take. Do not take more medicine than directed. Call emergency for help if you have problems breathing or unusual sleepiness. You may get drowsy or dizzy. Do not drive, use machinery, or do anything that needs mental alertness until you know how the medicine affects you. Do not stand or sit up quickly, especially if you are an older patient. This reduces the risk of dizzy or fainting spells. Alcohol may interfere with the effect of this medicine. Avoid alcoholic drinks. This medicine will cause constipation. Try to have a bowel movement at least every 2 to 3 days. If you do not have a bowel movement for 3 days, call your doctor or health care professional. Your mouth may get dry. Chewing sugarless gum or sucking hard candy, and drinking plenty of water may help. Contact your doctor if the problem does not go away or is severe. What side effects may I notice from receiving this  medicine? Side effects that you should report to your doctor or health care professional as soon as possible: -allergic reactions like skin rash, itching or hives, swelling of the face, lips, or tongue -breathing problems -confusion -signs and symptoms of low blood pressure like dizziness; feeling faint or lightheaded, falls; unusually weak or tired -trouble passing urine or change in the amount of urine -trouble swallowing Side effects that usually do not require medical attention (report to your doctor or health care professional if they continue or are bothersome): -constipation -dry mouth -nausea, vomiting -tiredness This list may not describe all possible side effects. Call your doctor for medical advice about side effects. You may report side effects to FDA at 1-800-FDA-1088. Where should I keep my medicine? Keep out of the reach of children. This medicine can be abused. Keep your medicine in a safe place to protect  it from theft. Do not share this medicine with anyone. Selling or giving away this medicine is dangerous and against the law. Store at room temperature between 15 and 30 degrees C (59 and 86 degrees F). Protect from light. Keep container tightly closed. This medicine may cause accidental overdose and death if it is taken by other adults, children, or pets. Flush any unused medicine down the toilet to reduce the chance of harm. Do not use the medicine after the expiration date. NOTE: This sheet is a summary. It may not cover all possible information. If you have questions about this medicine, talk to your doctor, pharmacist, or health care provider.  2018 Elsevier/Gold Standard (2015-10-17 16:55:57)

## 2018-06-26 NOTE — Progress Notes (Signed)
Due to moderate dizziness with ambulation, plan to obtain a stat H&H to assess for stability.  BP and HR stable.  Adequate urine output.  Discharge on hold at this time. Scop patch removed.  Dr. Denman George and Dr. Delsa Sale aware.

## 2018-06-26 NOTE — Discharge Summary (Signed)
Physician Discharge Summary  Patient ID: [name] MRN: [id number] DOB/AGE: [birth year] 62 y.o.  Admit date: 06/25/2018 Discharge date: 06/26/2018  Admission Diagnoses: Endometrial cancer [facility])  Discharge Diagnoses:  Principal Problem:   Endometrial cancer ([place]) Active Problems:   BMI 38.0-38.9,adult   Discharged Condition:  The patient is in good condition and stable for discharge.    Hospital Course: On 06/25/2018, the patient underwent the following: Procedure(s): XI ROBOTIC ASSISTED TOTAL HYSTERECTOMY WITH BILATERAL SALPINGO OOPHORECTOMY SENTINEL NODE BIOPSY.  The postoperative course was uneventful.  She was discharged to home on postoperative day 1 tolerating a regular diet, ambulating, voiding, pain controlled.  Consults: None  Significant Diagnostic Studies: None  Treatments: surgery: see above  Discharge Exam: Blood pressure 130/69, pulse 66, temperature 98 F (36.7 C), temperature source Oral, resp. rate 16, height 5' 5.5" (1.664 m), weight 225 lb 6 oz (102.2 kg), last menstrual period 12/17/2003, SpO2 95 %. General appearance: alert, cooperative and no distress Resp: clear to auscultation bilaterally Cardio: regular rate and rhythm, S1, S2 normal, no murmur, click, rub or gallop GI: soft, non-tender; bowel sounds normal; no masses,  no organomegaly Extremities: extremities normal, atraumatic, no cyanosis or edema Incision/Wound: Lap sites to the abdomen with dermabond without drainage or erythema  Disposition: Discharge disposition: 01-Home or Self Care       Discharge Instructions    Call MD for:  difficulty breathing, headache or visual disturbances   Complete by:  As directed    Call MD for:  extreme fatigue   Complete by:  As directed    Call MD for:  hives   Complete by:  As directed    Call MD for:  persistant dizziness or light-headedness   Complete by:  As directed    Call MD for:  persistant nausea and vomiting   Complete by:  As directed     Call MD for:  redness, tenderness, or signs of infection (pain, swelling, redness, odor or green/yellow discharge around incision site)   Complete by:  As directed    Call MD for:  severe uncontrolled pain   Complete by:  As directed    Call MD for:  temperature >100.4   Complete by:  As directed    Diet - low sodium heart healthy   Complete by:  As directed    Driving Restrictions   Complete by:  As directed    No driving for 1 week.  Do not take narcotics and drive.   Increase activity slowly   Complete by:  As directed    Lifting restrictions   Complete by:  As directed    No lifting greater than 10 lbs.   Sexual Activity Restrictions   Complete by:  As directed    No sexual activity, nothing in the vagina, for 8 weeks.     Allergies as of 06/26/2018      Reactions   Shellfish Allergy Hives, Itching, Swelling   Penicillins Hives, Itching   Has patient had a PCN reaction causing immediate rash, facial/tongue/throat swelling, SOB or lightheadedness with hypotension: Yes Has patient had a PCN reaction causing severe rash involving mucus membranes or skin necrosis: Yes Has patient had a PCN reaction that required hospitalization: Yes Has patient had a PCN reaction occurring within the last 10 years: No If all of the above answers are "NO", then may proceed with Cephalosporin use.      Medication List    TAKE these medications   albuterol 108 (90  Base) MCG/ACT inhaler Commonly known as:  PROVENTIL HFA;VENTOLIN HFA Inhale 2 puffs into the lungs every 4 (four) hours as needed for wheezing or shortness of breath.   amLODipine 10 MG tablet Commonly known as:  NORVASC Take 1 tablet (10 mg total) by mouth daily.   bisoprolol 10 MG tablet Commonly known as:  ZEBETA Take 1 tablet (10 mg total) by mouth daily.   DELSYM COUGH RELIEF MT Take 5 mLs by mouth daily as needed (cough).   famotidine 20 MG tablet Commonly known as:  PEPCID Take 1 tablet (20 mg total) by mouth at  bedtime.   hydrochlorothiazide 25 MG tablet Commonly known as:  HYDRODIURIL Take 1 tablet (25 mg total) by mouth every morning.   oxyCODONE 5 MG immediate release tablet Commonly known as:  Oxy IR/ROXICODONE Take 1 tablet (5 mg total) by mouth every 4 (four) hours as needed for severe pain.   phenylephrine 10 MG Tabs tablet Commonly known as:  SUDAFED PE Take 10 mg by mouth every 4 (four) hours as needed (allergies).   predniSONE 1 MG tablet Commonly known as:  DELTASONE Take 1 mg by mouth daily with breakfast.   RABEprazole 20 MG tablet Commonly known as:  ACIPHEX Take 20 mg by mouth daily before breakfast.   simvastatin 20 MG tablet Commonly known as:  ZOCOR Take 20 mg by mouth daily.   traMADol 50 MG tablet Commonly known as:  ULTRAM 1-2 every 4 hours as needed for cough or pain What changed:    how much to take  how to take this  when to take this  reasons to take this  additional instructions   triamcinolone 0.1 % cream : eucerin Crea Apply 1 application topically daily as needed for rash or itching. What changed:  when to take this      Follow-up Information    Everitt Amber, MD Follow up on 07/17/2018.   Specialty:  Gynecologic Oncology Why:  at 12:45pm at the [facility] for post-op check Contact information: [place] [address] [phone]           Greater than thirty minutes were spend for face to face discharge instructions and discharge orders/summary in EPIC.   Signed: Dorothyann Gibbs 06/26/2018, 9:43 AM

## 2018-06-27 NOTE — Progress Notes (Signed)
Nurse reviewed discharge instructions with pt.  Pt verbalized understanding of discharge instructions, new medications and follow up appointments.  Prescription given to pt yesterday.

## 2018-06-27 NOTE — Discharge Summary (Signed)
Physician Discharge Summary  Patient ID: [name] MRN: [id number] DOB/AGE: 60-Jan-1957 60 y.o.  Admit date: 06/25/2018 Discharge date: 06/27/2018  Admission Diagnoses: Endometrial cancer [facility])  Discharge Diagnoses:  Principal Problem:   Endometrial cancer [facility]) Active Problems:   BMI 38.0-38.9,adult   Discharged Condition: good  Hospital Course: On 06/25/2018, the patient underwent the following: Procedure(s): XI ROBOTIC ASSISTED TOTAL HYSTERECTOMY WITH BILATERAL SALPINGO OOPHORECTOMY SENTINEL NODE BIOPSY.   The postoperative course was remarkable for c/o dizziness.  She remained hemodynamically stable; serial hemoglobin levels were in range.  The dizziness had resolved on POD#2.   She was discharged to home on postoperative day 2 tolerating a regular diet, meeting postoperative goals.  Consults: None  Significant Diagnostic Studies: None  Treatments: surgery: see above  Discharge Exam: Blood pressure (!) 147/79, pulse (!) 56, temperature 98 F (36.7 C), temperature source Oral, resp. rate 15, height 5' 5.5" (1.664 m), weight 225 lb 6 oz (102.2 kg), last menstrual period 12/17/2003, SpO2 94 %. General appearance: alert Resp: clear to auscultation bilaterally Cardio: regular rate and rhythm, S1, S2 normal, no murmur, click, rub or gallop GI: soft, non-tender; bowel sounds normal; no masses,  no organomegaly Pelvic: no heme per vagina Extremities: extremities normal, atraumatic, no cyanosis or edema and Homans sign is negative, no sign of DVT Incision/Wound: C/D/I  Disposition: Discharge disposition: 01-Home or Self Care       Discharge Instructions    Activity as tolerated - No restrictions   Complete by:  As directed    Call MD for:  difficulty breathing, headache or visual disturbances   Complete by:  As directed    Call MD for:  extreme fatigue   Complete by:  As directed    Call MD for:  extreme fatigue   Complete by:  As directed    Call MD for:  hives    Complete by:  As directed    Call MD for:  persistant dizziness or light-headedness   Complete by:  As directed    Call MD for:  persistant dizziness or light-headedness   Complete by:  As directed    Call MD for:  persistant nausea and vomiting   Complete by:  As directed    Call MD for:  persistant nausea and vomiting   Complete by:  As directed    Call MD for:  redness, tenderness, or signs of infection (pain, swelling, redness, odor or green/yellow discharge around incision site)   Complete by:  As directed    Call MD for:  redness, tenderness, or signs of infection (pain, swelling, redness, odor or green/yellow discharge around incision site)   Complete by:  As directed    Call MD for:  severe uncontrolled pain   Complete by:  As directed    Call MD for:  severe uncontrolled pain   Complete by:  As directed    Call MD for:  temperature >100.4   Complete by:  As directed    Call MD for:  temperature >100.4   Complete by:  As directed    Diet - low sodium heart healthy   Complete by:  As directed    Diet - low sodium heart healthy   Complete by:  As directed    Diet Carb Modified   Complete by:  As directed    Diet general   Complete by:  As directed    Discharge instructions   Complete by:  As directed    Return to  work: 3- 4 weeks  Activity: 1. Be up and out of the bed during the day.  Take a nap if needed.  You may walk up steps but be careful and use the hand rail.  Stair climbing will tire you more than you think, you may need to stop part way and rest.   2. No lifting or straining for 6 weeks.  3. No driving for 1-2 weeks.  Do Not drive if you are taking narcotic pain medicine.  4. Shower daily.  Use soap and water on your incision and pat dry; don't rub.   5. No sexual activity and nothing in the vagina for 4 weeks.  Diet: 1. Low sodium Heart Healthy Diet is recommended.  2. It is safe to use a laxative if you have difficulty moving your bowels.   Wound  Care: 1. Keep clean and dry.  Shower daily.  Reasons to call the Doctor:  Fever - Oral temperature greater than 100.4 degrees Fahrenheit Foul-smelling vaginal discharge Difficulty urinating Nausea and vomiting Increased pain at the site of the incision that is unrelieved with pain medicine. Difficulty breathing with or without chest pain New calf pain especially if only on one side Sudden, continuing increased vaginal bleeding with or without clots.   Follow-up: 1. See Everitt Amber in 4 weeks.  Contacts: For questions or concerns you should contact:  Dr. Everitt Amber at [phone]  or at [place]   Discharge wound care:   Complete by:  As directed    Keep clean and dry   Driving Restrictions   Complete by:  As directed    No driving for 1 week.  Do not take narcotics and drive.   Driving Restrictions   Complete by:  As directed    No driving for 1- 2 weeks   Increase activity slowly   Complete by:  As directed    Increase activity slowly   Complete by:  As directed    Lifting restrictions   Complete by:  As directed    No lifting greater than 10 lbs.   Lifting restrictions   Complete by:  As directed    No lifting > 10 lbs for 6 weeks   May shower / Bathe   Complete by:  As directed    No tub baths for 6 weeks   Sexual Activity Restrictions   Complete by:  As directed    No sexual activity, nothing in the vagina, for 8 weeks.   Sexual Activity Restrictions   Complete by:  As directed    No intercourse for 8 weeks     Allergies as of 06/27/2018      Reactions   Shellfish Allergy Hives, Itching, Swelling   Penicillins Hives, Itching   Has patient had a PCN reaction causing immediate rash, facial/tongue/throat swelling, SOB or lightheadedness with hypotension: Yes Has patient had a PCN reaction causing severe rash involving mucus membranes or skin necrosis: Yes Has patient had a PCN reaction that required hospitalization: Yes Has patient had a PCN  reaction occurring within the last 10 years: No If all of the above answers are "NO", then may proceed with Cephalosporin use.      Medication List    TAKE these medications   albuterol 108 (90 Base) MCG/ACT inhaler Commonly known as:  PROVENTIL HFA;VENTOLIN HFA Inhale 2 puffs into the lungs every 4 (four) hours as needed for wheezing or shortness of breath.   amLODipine 10 MG tablet Commonly known  as:  NORVASC Take 1 tablet (10 mg total) by mouth daily.   bisoprolol 10 MG tablet Commonly known as:  ZEBETA Take 1 tablet (10 mg total) by mouth daily.   DELSYM COUGH RELIEF MT Take 5 mLs by mouth daily as needed (cough).   famotidine 20 MG tablet Commonly known as:  PEPCID Take 1 tablet (20 mg total) by mouth at bedtime.   hydrochlorothiazide 25 MG tablet Commonly known as:  HYDRODIURIL Take 1 tablet (25 mg total) by mouth every morning.   oxyCODONE 5 MG immediate release tablet Commonly known as:  Oxy IR/ROXICODONE Take 1 tablet (5 mg total) by mouth every 4 (four) hours as needed for severe pain.   phenylephrine 10 MG Tabs tablet Commonly known as:  SUDAFED PE Take 10 mg by mouth every 4 (four) hours as needed (allergies).   predniSONE 1 MG tablet Commonly known as:  DELTASONE Take 1 mg by mouth daily with breakfast.   RABEprazole 20 MG tablet Commonly known as:  ACIPHEX Take 20 mg by mouth daily before breakfast.   simvastatin 20 MG tablet Commonly known as:  ZOCOR Take 20 mg by mouth daily.   traMADol 50 MG tablet Commonly known as:  ULTRAM 1-2 every 4 hours as needed for cough or pain What changed:    how much to take  how to take this  when to take this  reasons to take this  additional instructions   triamcinolone 0.1 % cream : eucerin Crea Apply 1 application topically daily as needed for rash or itching. What changed:  when to take this            Discharge Care Instructions  (From admission, onward)        Start     Ordered    06/27/18 0000  Discharge wound care:    Comments:  Keep clean and dry   06/27/18 0809     Follow-up Information    Everitt Amber, MD Follow up on 07/17/2018.   Specialty:  Gynecologic Oncology Why:  at 12:45pm at the [facility] for post-op check Contact information: [place] [address] [phone]           Signed: Lahoma Crocker 06/27/2018, 8:11 AM

## 2018-07-02 ENCOUNTER — Telehealth: Payer: Self-pay | Admitting: Gynecologic Oncology

## 2018-07-02 ENCOUNTER — Encounter: Payer: Self-pay | Admitting: Gynecologic Oncology

## 2018-07-02 NOTE — Telephone Encounter (Signed)
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 Typically we would treat this with whole pelvic RT and vaginal brachytherapy, however the patient has already received radiation (for either anal or rectal cancer) with Dr Tammi Klippel.  We will review her prior radiation records and discuss her case at multidisciplinary conference.  We will let her know if we are planning additional radiation or chemotherapy.  Thereasa Solo, MD

## 2018-07-06 ENCOUNTER — Encounter: Payer: Self-pay | Admitting: Gynecologic Oncology

## 2018-07-06 ENCOUNTER — Telehealth: Payer: Self-pay | Admitting: Oncology

## 2018-07-06 DIAGNOSIS — D4959 Neoplasm of unspecified behavior of other genitourinary organ: Secondary | ICD-10-CM

## 2018-07-06 DIAGNOSIS — C541 Malignant neoplasm of endometrium: Secondary | ICD-10-CM

## 2018-07-06 NOTE — Telephone Encounter (Signed)
Requested ER/PR for accession: [id number] with Maudie Mercury in [facility] Pathology.

## 2018-07-06 NOTE — Progress Notes (Signed)
Gynecologic Oncology Multi-Disciplinary Disposition Conference Note  Date of the Conference: July 06, 2018  Patient Name: [name]  Referring Provider: Dr. Vanessa Kick Primary GYN Oncologist: Dr. Everitt Amber  Stage/Disposition:  Stage IB endometrial cancer. Disposition is to vaginal brachytherapy.  Plan for CT CAP.  ER/PR testing on tumor.   This Multidisciplinary conference took place involving physicians from [place], [place], Radiation Oncology, Pathology, Radiology along with the Gynecologic Oncology Nurse Practitioner and RN.  Comprehensive assessment of the patient's malignancy, staging, need for surgery, chemotherapy, radiation therapy, and need for further testing were reviewed. Supportive measures, both inpatient and following discharge were also discussed. The recommended plan of care is documented. Greater than 35 minutes were spent correlating and coordinating this patient's care.

## 2018-07-06 NOTE — Telephone Encounter (Signed)
Called [name] and advised her that her case was presented at GYN conference this morning and that the recommendations were for vaginal brachytherapy and to have a CT chest, abdomin, pelvis.  She verbalized understanding and agreement.

## 2018-07-07 ENCOUNTER — Encounter: Payer: Self-pay | Admitting: Radiation Oncology

## 2018-07-17 ENCOUNTER — Inpatient Hospital Stay: Payer: PRIVATE HEALTH INSURANCE | Admitting: Gynecologic Oncology

## 2018-07-20 ENCOUNTER — Ambulatory Visit (HOSPITAL_COMMUNITY)
Admission: RE | Admit: 2018-07-20 | Discharge: 2018-07-20 | Disposition: A | Discharge status: Home / Self Care | Payer: PRIVATE HEALTH INSURANCE | Source: Ambulatory Visit | Attending: Gynecologic Oncology | Admitting: Gynecologic Oncology

## 2018-07-20 ENCOUNTER — Other Ambulatory Visit: Payer: Self-pay | Admitting: Gynecologic Oncology

## 2018-07-20 DIAGNOSIS — Z90710 Acquired absence of both cervix and uterus: Secondary | ICD-10-CM | POA: Insufficient documentation

## 2018-07-20 DIAGNOSIS — D4959 Neoplasm of unspecified behavior of other genitourinary organ: Secondary | ICD-10-CM | POA: Insufficient documentation

## 2018-07-20 DIAGNOSIS — C541 Malignant neoplasm of endometrium: Secondary | ICD-10-CM

## 2018-07-20 MED ORDER — IOHEXOL 300 MG/ML  SOLN
100.0000 mL | Freq: Once | INTRAMUSCULAR | Status: AC | PRN
  Administered 2018-07-20: 100 mL via INTRAVENOUS

## 2018-07-20 NOTE — Progress Notes (Signed)
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    [name] presented with symptoms of: Post menopausal bleeding, PMH of squamous cell carcinoma of anal margin:  Patient began experiencing postmenopausal bleeding in May 2019.  Prior to that time she had been amenorrheic since receiving whole pelvic radiation in 2005 as neoadjuvant treatment with chemotherapy for locally advanced anal cancer.  Following radiation she had a good response and required a local resection at the anal canal but did not require an APR colostomy.  Since receiving that radiation treatment she has had ongoing toxicities from radiation including loose stools.    Biopsies revealed: 06/25/18:  4. Uterus +/- tubes/ovaries, neoplastic, cervix, bilateral tubes and ovaries - UTERUS: -ENDO/MYOMETRIUM: INVASIVE ENDOMETRIOID ADENOCARCINOMA, FIGO GRADE 1, SPANNING 2.4 CM TUMOR INVADES GREATER THAN ONE HALF OF THE MYOMETRIUM. LYMPHOVASCULAR INVASION IS PRESENT. SEE ONCOLOGY TABLE. -SEROSA: UNREMARKABLE. NO MALIGNANCY IDENTIFIED. - CERVIX: LOW GRADE SQUAMOUS INTRAEPITHELIAL LESION, CIN-I (MILD DYSPLASIA).  1. Lymph node, sentinel, biopsy, right obturator - ISOLATED TUMOR CELLS IN ONE OF ONE LYMPH NODE (0/1). 2. Lymph node, sentinel, biopsy, left deep obturator - ISOLATED TUMOR CELLS IN ONE OF ONE LYMPH NODE (0/1).  Past/Anticipated interventions by Gyn/Onc surgery, if any: 06/25/18: Operation: Robotic-assisted laparoscopic total hysterectomy with bilateral salpingoophorectomy, SLN biopsy   Surgeon: Donaciano Eva     Past/Anticipated interventions by medical oncology, if any: None at this time.  Weight changes, if any: None  Bowel/Bladder complaints, if any: None  Nausea/Vomiting, if any: None; pt denies abdominal bloating  Pain issues, if any:  None  SAFETY ISSUES:  Prior radiation? Yes, to  rectum.   Pacemaker/ICD? No  Possible current pregnancy? No, pt is post-surgical  Is the patient on methotrexate? No   Current Complaints / other details:  Pt presents today for consult for Radiation Oncology and HDR with Dr. Sondra Come. Pt is accompanied by sister, who is RN Tourist information centre manager. Pt is anxious about treatment and side effects. Pt is hypertensive but states she has not taken her antihypertensives yet today. Pt states she is asymptomatic with her hypertension. [name], RN BSN

## 2018-07-21 ENCOUNTER — Telehealth: Payer: Self-pay

## 2018-07-21 NOTE — Telephone Encounter (Signed)
Told [name] that there were no findings for residual tumor or any metastatic disease in the abdomen. In the chest there were changes consistent with known sarcoidosis per Melissa Cross,NP. Keep appointment on 07-23-18 as scheduled.

## 2018-07-22 ENCOUNTER — Encounter: Payer: Self-pay | Admitting: Radiation Oncology

## 2018-07-22 ENCOUNTER — Telehealth: Payer: Self-pay | Admitting: *Deleted

## 2018-07-22 ENCOUNTER — Ambulatory Visit
Admission: RE | Admit: 2018-07-22 | Discharge: 2018-07-22 | Disposition: A | Discharge status: Home / Self Care | Payer: PRIVATE HEALTH INSURANCE | Source: Ambulatory Visit | Attending: Radiation Oncology | Admitting: Radiation Oncology

## 2018-07-22 ENCOUNTER — Encounter: Payer: Self-pay | Admitting: Oncology

## 2018-07-22 ENCOUNTER — Other Ambulatory Visit: Payer: Self-pay

## 2018-07-22 VITALS — BP 208/94 | HR 72 | Temp 98.2°F | Resp 18 | Ht 65.0 in | Wt 227.0 lb

## 2018-07-22 DIAGNOSIS — Z90710 Acquired absence of both cervix and uterus: Secondary | ICD-10-CM | POA: Insufficient documentation

## 2018-07-22 DIAGNOSIS — I10 Essential (primary) hypertension: Secondary | ICD-10-CM | POA: Diagnosis not present

## 2018-07-22 DIAGNOSIS — C541 Malignant neoplasm of endometrium: Secondary | ICD-10-CM | POA: Insufficient documentation

## 2018-07-22 DIAGNOSIS — Z87891 Personal history of nicotine dependence: Secondary | ICD-10-CM | POA: Insufficient documentation

## 2018-07-22 DIAGNOSIS — Z85048 Personal history of other malignant neoplasm of rectum, rectosigmoid junction, and anus: Secondary | ICD-10-CM | POA: Insufficient documentation

## 2018-07-22 DIAGNOSIS — Z7401 Bed confinement status: Secondary | ICD-10-CM | POA: Diagnosis not present

## 2018-07-22 DIAGNOSIS — Z923 Personal history of irradiation: Secondary | ICD-10-CM | POA: Insufficient documentation

## 2018-07-22 DIAGNOSIS — Z79899 Other long term (current) drug therapy: Secondary | ICD-10-CM | POA: Diagnosis not present

## 2018-07-22 DIAGNOSIS — Z880 Allergy status to penicillin: Secondary | ICD-10-CM | POA: Diagnosis not present

## 2018-07-22 DIAGNOSIS — Z91013 Allergy to seafood: Secondary | ICD-10-CM | POA: Diagnosis not present

## 2018-07-22 DIAGNOSIS — N83201 Unspecified ovarian cyst, right side: Secondary | ICD-10-CM | POA: Diagnosis not present

## 2018-07-22 DIAGNOSIS — N83202 Unspecified ovarian cyst, left side: Secondary | ICD-10-CM | POA: Diagnosis not present

## 2018-07-22 NOTE — Progress Notes (Signed)
Consult Note: Gyn-Onc  Consult was requested by Dr. Vanessa Kick for the evaluation of [name] 62 y.o. female  CC:  Chief Complaint  Patient presents with  . Endometrial cancer [facility])    Assessment/Plan:  Veronica. [name]  is a 62 y.o.  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 MSI stable, MMR normal. ER/PR negative.  I explained to [name] and her family that [name] met criteria for high intermediate risk factors for her uterine cancer which places her at higher risk for recurrence. Some of this risk can be mitigated with adjuvant radiation. While isolated tumor cells are not known to be associated with increased risk for recurrence, they are concerning and treatment is typically formulated based on uterine factors. [name] has received whole pelvic RT in the past and this precludes her from receiving additional whole pelvic dosing, however, her case was reviewed at multidisciplinary conference, and [name] is felt to be a good candidate for vaginal brachytherapy. We will facilitate consultation with Dr Sondra Come.   HPI: [name] is a 62 year old parous woman who is seen in consultation at the request of Dr Harrington Challenger for endometrial cancer.   Patient began experiencing postmenopausal bleeding in May 2019.  Prior to that time [name] had been amenorrheic since receiving whole pelvic radiation in 2005 as neoadjuvant treatment with chemotherapy for locally advanced anal cancer.  Following radiation [name] had a good response and required a local resection at the anal canal but did not require an APR colostomy.  Since receiving that radiation treatment [name] has had ongoing toxicities from radiation including loose stools.  Her other significant medical histories include obesity.  [name] also has a significant medical history of sarcoidosis with particular impact upon her pulmonary function.  [name] takes daily prednisone 5 mg for this and additional inhaled agents.  [name] sees Dr.  Melvyn Novas for pulmonology care.  [name] reports shortness of breath with ambulation for more than 20 m and shortness of breath upon stairclimbing.  [name] cannot comfortably lie flat due to shortness of breath.  [name] does not use oxygen therapy.  Her any prior abdominal surgical history is a laparoscopic cholecystectomy.  [name] has however had 2 prior cesarean sections.  Interval Hx: On 06/25/18 [name] underwent robotic assisted total hysterectomy, BSO, SLN biopsy. Intraoperative findings were unremarkable. Final pathology revealed grade 1 endometrioid adenocarcinoma, 2.4cm, deep myometrial invasion, LVSI present. There was ITC's in bilateral SLN's. MMR normal, MSI stable. ER/PR negative. [name] was determined to have high risk factors for recurrence and adjuvant therapy was recommended in accordance to NCCN guidelines.  [name] has already received whole pelvic RT for her prior diagnosis of anal cancer in 2005.  Her case was reviewed by multidisciplinary conference, and prior radiation fields were reviewed. [name] was recommended to be considered for vaginal brachytherapy.   Current Meds:  Outpatient Encounter Medications as of 07/23/2018  Medication Sig  . albuterol (PROVENTIL HFA;VENTOLIN HFA) 108 (90 Base) MCG/ACT inhaler Inhale 2 puffs into the lungs every 4 (four) hours as needed for wheezing or shortness of breath.  Marland Kitchen amLODipine (NORVASC) 10 MG tablet Take 1 tablet (10 mg total) by mouth daily.  . bisoprolol (ZEBETA) 10 MG tablet Take 1 tablet (10 mg total) by mouth daily.  Marland Kitchen Dextromethorphan-Menthol (DELSYM COUGH RELIEF MT) Take 5 mLs by mouth daily as needed (cough).   . famotidine (PEPCID) 20 MG tablet Take 1 tablet (20 mg total) by mouth at bedtime.  . hydrochlorothiazide (HYDRODIURIL)  25 MG tablet Take 1 tablet (25 mg total) by mouth every morning.  Marland Kitchen oxyCODONE (OXY IR/ROXICODONE) 5 MG immediate release tablet Take 1 tablet (5 mg total) by mouth every 4 (four) hours as needed for severe pain.  . phenylephrine  (SUDAFED PE) 10 MG TABS tablet Take 10 mg by mouth every 4 (four) hours as needed (allergies).   . predniSONE (DELTASONE) 1 MG tablet Take 1 mg by mouth daily with breakfast.  . RABEprazole (ACIPHEX) 20 MG tablet Take 20 mg by mouth daily before breakfast.   . simvastatin (ZOCOR) 20 MG tablet Take 20 mg by mouth daily.  . traMADol (ULTRAM) 50 MG tablet 1-2 every 4 hours as needed for cough or pain (Patient taking differently: Take 50 mg by mouth every 4 (four) hours as needed for moderate pain (cough). )  . Triamcinolone Acetonide (TRIAMCINOLONE 0.1 % CREAM : EUCERIN) CREA Apply 1 application topically daily as needed for rash or itching. (Patient taking differently: Apply 1 application topically daily. )   No facility-administered encounter medications on file as of 07/23/2018.     Allergy:  Allergies  Allergen Reactions  . Shellfish Allergy Hives, Itching and Swelling  . Penicillins Hives and Itching    Has patient had a PCN reaction causing immediate rash, facial/tongue/throat swelling, SOB or lightheadedness with hypotension: Yes Has patient had a PCN reaction causing severe rash involving mucus membranes or skin necrosis: Yes Has patient had a PCN reaction that required hospitalization: Yes Has patient had a PCN reaction occurring within the last 10 years: No If all of the above answers are "NO", then may proceed with Cephalosporin use.     Social Hx:   Social History   Socioeconomic History  . Marital status: Single    Spouse name: Not on file  . Number of children: Not on file  . Years of education: Not on file  . Highest education level: Not on file  Occupational History  . Occupation: Tech 1    Employer: [employer]  . Financial resource strain: Not on file  . Food insecurity:    Worry: Not on file    Inability: Not on file  . Transportation needs:    Medical: Not on file    Non-medical: Not on file  Tobacco Use  . Smoking status: Former Smoker     Packs/day: 0.50    Years: 1.00    Pack years: 0.50    Types: Cigarettes    Last attempt to quit: 12/16/1993    Years since quitting: 24.6  . Smokeless tobacco: Never Used  Substance and Sexual Activity  . Alcohol use: Yes    Comment: occasional  . Drug use: No  . Sexual activity: Never  Lifestyle  . Physical activity:    Days per week: Not on file    Minutes per session: Not on file  . Stress: Not on file  Relationships  . Social connections:    Talks on phone: Not on file    Gets together: Not on file    Attends religious service: Not on file    Active member of club or organization: Not on file    Attends meetings of clubs or organizations: Not on file    Relationship status: Not on file  . Intimate partner violence:    Fear of current or ex partner: Not on file    Emotionally abused: Not on file    Physically abused: Not on file  Forced sexual activity: Not on file  Other Topics Concern  . Not on file  Social History Narrative  . Not on file    Past Surgical Hx:  Past Surgical History:  Procedure Laterality Date  . BRONCHOSCOPY  01/22/2008   w/ Lavage and bronchial bx  . CARDIOVASCULAR STRESS TEST  06/27/2006   normal nuclear study w/ no ischemia/  normal LV function and wall motion , ef 61%  . CARPAL TUNNEL RELEASE Right 01/17/2016   and finger trigger release  . CLEFT LIP REPAIR    . CO2 LASER ABLATION VULVA    . EUA/  EXCISIONAL MULTIPLE PERINEAL/ANAL BX'S/  SIGMOIDSCOPY/  PORT-A-CATH PLACEMENT  03/27/2004  . LAPAROSCOPIC CHOLECYSTECTOMY  08/14/2008  . MULTIPLE ANAL BX'S  10/10/2006  . RELEASE A1 PULLEY THUMB Bilateral left 12-17-2006/  right 09-05-2009  . REMOVAL PORT-A-CATH/  EUA/  MULTIPLE ANAL BX'S  10/16/2004  . ROBOTIC ASSISTED TOTAL HYSTERECTOMY WITH BILATERAL SALPINGO OOPHERECTOMY Bilateral 06/25/2018   Procedure: XI ROBOTIC ASSISTED TOTAL HYSTERECTOMY WITH BILATERAL SALPINGO OOPHORECTOMY;  Surgeon: Everitt Amber, MD;  Location: [facility];  Service:  Gynecology;  Laterality: Bilateral;  . SENTINEL NODE BIOPSY N/A 06/25/2018   Procedure: SENTINEL NODE BIOPSY;  Surgeon: Everitt Amber, MD;  Location: [facility];  Service: Gynecology;  Laterality: N/A;  . TENNIS ELBOW RELEASE/NIRSCHEL PROCEDURE Right 11/28/2016   Procedure: RIGHT ELBOW ULNER NERVE RELEASE AND OR TRANSPOSITION;  Surgeon: Iran Planas, MD;  Location: [facility];  Service: Orthopedics;  Laterality: Right;    Past Medical Hx:  Past Medical History:  Diagnosis Date  . Bifid uvula   . GERD (gastroesophageal reflux disease)   . History of cervical dysplasia   . History of chemotherapy    06/ 2005  . History of cleft lip    CORRECTED  . History of condyloma acuminatum    PERIANAL  . History of radiation therapy    completed 06/ 2005  . 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 History of vulvar dysplasia   . Hypertension   . 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 Pulmonary nodule, left   . 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 Sarcoid uveitis of left eye goes to [facility]--  currently no issues per pt 11-27-2016  . Upper airway cough syndrome   . Wears glasses     Past Gynecological History:  Cervical dysplasia Patient's last menstrual period was 12/17/2003.  Family Hx:  Family History  Problem Relation Age of Onset  . Allergies Mother   . Asthma Mother   . Lung cancer Mother   . Allergies Sister   . Allergies Brother   . Asthma Brother   . Asthma Sister   . Sarcoidosis Neg Hx     Review of Systems:  Constitutional  Feels well,    ENT Normal appearing ears and nares bilaterally Skin/Breast  No rash, sores, jaundice, itching, dryness Cardiovascular  No chest pain or edema  Pulmonary  + shortness of breath  Gastro Intestinal  No nausea, vomitting.. No bright red blood per rectum, no abdominal  pain, change in bowel movement, or constipation. + diarrhea Genito Urinary  No frequency, urgency, dysuria, + spotting  Musculo Skeletal  No myalgia, arthralgia, joint swelling or pain  Neurologic  No weakness, numbness, change in gait,  Psychology  No depression,  anxiety, insomnia.   Vitals:  Blood pressure (!) 176/87, pulse 60, temperature 98.3 F (36.8 C), resp. rate 18, height 5' 5.5" (1.664 m), weight 226 lb (102.5 kg), last menstrual period 12/17/2003, SpO2 98 %.  Physical Exam: WD in NAD Neck  Supple NROM, without any enlargements.  Lymph Node Survey No cervical supraclavicular or inguinal adenopathy Cardiovascular  Pulse normal rate, regularity and rhythm. S1 and S2 normal.  Lungs  Clear to auscultation bilateraly, without wheezes/crackles/rhonchi. Good air movement.  Skin  No rash/lesions/breakdown  Psychiatry  Alert and oriented to person, place, and time  Abdomen  Normoactive bowel sounds, abdomen soft, non-tender and obese without evidence of hernia. Well healed incisions.  Back No CVA tenderness Genito Urinary  Vaginal cuff smooth, suture material visible, no defects, scant old blood, no active bleeding.  Rectal  Deferred, no visible anal lesions Extremities  No bilateral cyanosis, clubbing or edema.   30 minutes of direct face to face counseling time was spent with the patient. This included discussion about prognosis, therapy recommendations and postoperative side effects and are beyond the scope of routine postoperative care.  Thereasa Solo, MD  07/23/2018, 11:21 AM

## 2018-07-22 NOTE — Telephone Encounter (Signed)
Called patient to inform of [facility], spoke with patient and she is aware of these appt. datest and times

## 2018-07-22 NOTE — Progress Notes (Signed)
Met with [name] and her sister today.  She stated that her insurance doesn't pay anything.  I referred them to patient accounting to apply for financial assistance.  She will bring in a letter of support when she returns for her simulation. °

## 2018-07-22 NOTE — Progress Notes (Signed)
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   HISTORY OF PRESENT ILLNESS::[name] is a 62 y.o. female who originally presented with post-menopausal bleeding in May 2019. Prior to that time she had been amenorrheic since receiving whole pelvic radiation in 2005 as neoadjuvant treatment with chemotherapy for locally advanced anal cancer. The patient was previously treated for T1N0 squamous cancer of the anus in May and June of 2005. Following radiation she had a good response and required a local resection at the anal canal but did not require an APR colostomy. Since receiving radiation treatment she has had ongoing toxicities from radiation including loose stools and skin issues in the peri-buttocks region.  The patient underwent a total hysterectomy with bilateral solpingo oophorectomy on 06/25/2018 with Dr. Denman George. Final pathology showed invasive endometroid adenocarcinoma, FIGO grade 1, spanning 2.4 cm. Tumor invades greater than one half of the myometrium and lymphovascular invasion is present. Serosa sample was unremarkable and no malignancy identified. Cervix showed low grade squamous intraepithelial lesion, CIN-I (mild dysplasia). Bilateral ovaries showed inclusion cysts and no malignancy identified. Bilateral fallopian tubes were unremarkable and no malignancy identified. 2 out of 2 lymph nodes sampled showed isolated tumor cells but still N0. Fatty tissue from the left external iliac showed benign fibroadipose tissue. ER / PR negative.  CT C/A/P on 07/20/2018 shows surgical changes from recent hysterectomy and no findings suspicious for residual tumor or  adenopathy in the pelvis. No CT findings to suggest omental, peritoneal surface or solid organ metastatic disease in the abdomen. There are changes consistent with known sarcoidosis in the chest with bulky calcified mediastinal and hilar lymph nodes and interstitial lung disease and no definite CT findings to suggest metastatic disease involving the chest.  No family history of breast cancer.  The patient's case was discussed at our multidisciplinary gynecologic tumor board. The patient is here for further evaluation and discussion of radiation treatment options in the management of her disease.   PREVIOUS RADIATION THERAPY: Yes, In 2005 received total of 57.6 Gy for anal cancer with Dr. Tammi Klippel.  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    PAST SURGICAL HISTORY: Past Surgical History:  Procedure Laterality Date  . BRONCHOSCOPY  01/22/2008   w/ Lavage and bronchial bx  . CARDIOVASCULAR STRESS TEST  06/27/2006   normal nuclear study w/ no ischemia/  normal LV function and wall motion , ef 61%  . CARPAL TUNNEL RELEASE Right 01/17/2016   and finger trigger release  . CLEFT LIP REPAIR    . CO2 LASER ABLATION VULVA    . EUA/  EXCISIONAL MULTIPLE PERINEAL/ANAL BX'S/  SIGMOIDSCOPY/  PORT-A-CATH PLACEMENT  03/27/2004  . LAPAROSCOPIC CHOLECYSTECTOMY  08/14/2008  . MULTIPLE ANAL BX'S  10/10/2006  . RELEASE A1 PULLEY THUMB  Bilateral left 12-17-2006/  right 09-05-2009  . REMOVAL PORT-A-CATH/  EUA/   MULTIPLE ANAL BX'S  10/16/2004  . ROBOTIC ASSISTED TOTAL HYSTERECTOMY WITH BILATERAL SALPINGO OOPHERECTOMY Bilateral 06/25/2018   Procedure: XI ROBOTIC ASSISTED TOTAL HYSTERECTOMY WITH BILATERAL SALPINGO OOPHORECTOMY;  Surgeon: Everitt Amber, MD;  Location: [facility];  Service: Gynecology;  Laterality: Bilateral;  . SENTINEL NODE BIOPSY N/A 06/25/2018   Procedure: SENTINEL NODE BIOPSY;  Surgeon: Everitt Amber, MD;  Location: [facility];  Service: Gynecology;  Laterality: N/A;  . TENNIS ELBOW RELEASE/NIRSCHEL PROCEDURE Right 11/28/2016   Procedure: RIGHT ELBOW ULNER NERVE RELEASE AND OR TRANSPOSITION;  Surgeon: Iran Planas, MD;  Location: [place];  Service: Orthopedics;  Laterality: Right;    FAMILY HISTORY: family history includes Allergies in her brother, mother, and sister; Asthma in her brother, mother, and sister; Lung cancer in her mother.  SOCIAL HISTORY:  reports that she quit smoking about 24 years ago. Her smoking use included cigarettes. She has a 0.50 pack-year smoking history. She has never used smokeless tobacco. She reports that she drinks alcohol. She reports that she does not use drugs.  ALLERGIES: Shellfish allergy and Penicillins  MEDICATIONS:  Current Outpatient Medications  Medication Sig Dispense Refill  . albuterol (PROVENTIL HFA;VENTOLIN HFA) 108 (90 Base) MCG/ACT inhaler Inhale 2 puffs into the lungs every 4 (four) hours as needed for wheezing or shortness of breath. 1 Inhaler 5  . amLODipine (NORVASC) 10 MG tablet Take 1 tablet (10 mg total) by mouth daily. 90 tablet 0  . bisoprolol (ZEBETA) 10 MG tablet Take 1 tablet (10 mg total) by mouth daily. 90 tablet 1  . Dextromethorphan-Menthol (DELSYM COUGH RELIEF MT) Take 5 mLs by mouth daily as needed (cough).     . famotidine (PEPCID) 20 MG tablet Take 1 tablet (20 mg total) by mouth at bedtime. 30 tablet 11  . hydrochlorothiazide (HYDRODIURIL) 25 MG tablet Take 1 tablet (25 mg total) by mouth every morning. 30  tablet 0  . oxyCODONE (OXY IR/ROXICODONE) 5 MG immediate release tablet Take 1 tablet (5 mg total) by mouth every 4 (four) hours as needed for severe pain. 30 tablet 0  . phenylephrine (SUDAFED PE) 10 MG TABS tablet Take 10 mg by mouth every 4 (four) hours as needed (allergies).     . predniSONE (DELTASONE) 1 MG tablet Take 1 mg by mouth daily with breakfast.    . RABEprazole (ACIPHEX) 20 MG tablet Take 20 mg by mouth daily before breakfast.     . simvastatin (ZOCOR) 20 MG tablet Take 20 mg by mouth daily.    . traMADol (ULTRAM) 50 MG tablet 1-2 every 4 hours as needed for cough or pain (Patient taking differently: Take 50 mg by mouth every 4 (four) hours as needed for moderate pain (cough). ) 40 tablet 0  . Triamcinolone Acetonide (TRIAMCINOLONE 0.1 % CREAM : EUCERIN) CREA Apply 1 application topically daily as needed for rash or itching. (Patient taking differently: Apply 1 application topically daily. ) 500 each 0   No current facility-administered medications for this encounter.     REVIEW OF SYSTEMS:  On review of systems, the patient reports that she is doing well overall. She denies any weight changes. She denies any bowel or bladder complaints at this time. She denies urinary incontinence. She does note continued intermittent anal bleeding secondary to previous treatment. She denies nausea, vomiting, or abdominal bloating. She denies any increased lower extremity swelling after surgery. She admits she is anxious about  treatment and side effects. She denies chest pain or shortness of breath. She is hypertensive today but states she has not taken her anti-hypertensive medication yet today. Patient is asymptomatic with her hypertension. She denies headaches. A complete review of systems is obtained and is otherwise negative. REVIEW OF SYSTEMS: A 10+ POINT REVIEW OF SYSTEMS WAS OBTAINED including neurology, dermatology, psychiatry, cardiac, respiratory, lymph, extremities, GI, GU, musculoskeletal,  constitutional, reproductive, HEENT. All pertinent positives are noted in the HPI. All others are negative.   PHYSICAL EXAM:  height is 5\' 5"  (1.651 m) and weight is 227 lb (103 kg). Her oral temperature is 98.2 F (36.8 C). Her blood pressure is 208/94 (abnormal) and her pulse is 72. Her respiration is 18 and oxygen saturation is 96%.   General: Alert and oriented, in no acute distress. Accompanied by her sister and niece. HEENT: Head is normocephalic. Extraocular movements are intact. Oropharynx is clear. Neck: Neck is supple, no palpable cervical or supraclavicular lymphadenopathy. Heart: Regular in rate and rhythm with no murmurs, rubs, or gallops. Chest: Clear to auscultation bilaterally, with no rhonchi, wheezes, or rales. Abdomen: Soft, nontender, nondistended, with no rigidity or guarding. Extremities: No cyanosis or edema. Lymphatics: see Neck Exam Skin: No concerning lesions. Musculoskeletal: symmetric strength and muscle tone throughout. Neurologic: Cranial nerves II through XII are grossly intact. No obvious focalities. Speech is fluent. Coordination is intact. Psychiatric: Judgment and insight are intact. Affect is appropriate. Pelvic exam deferred today in light of recent surgery  ECOG = 1  0 - Asymptomatic (Fully active, able to carry on all predisease activities without restriction)  1 - Symptomatic but completely ambulatory (Restricted in physically strenuous activity but ambulatory and able to carry out work of a light or sedentary nature. For example, light housework, office work)  2 - Symptomatic, <50% in bed during the day (Ambulatory and capable of all self care but unable to carry out any work activities. Up and about more than 50% of waking hours)  3 - Symptomatic, >50% in bed, but not bedbound (Capable of only limited self-care, confined to bed or chair 50% or more of waking hours)  4 - Bedbound (Completely disabled. Cannot carry on any self-care. Totally confined  to bed or chair)  5 - Death   Eustace Pen MM, Creech RH, Tormey DC, et al. [phone]). "Toxicity and response criteria of the [facility]". [place] Oncol. 5 (6): 649-55  LABORATORY DATA:  Lab Results  Component Value Date   WBC 9.2 06/26/2018   HGB 10.8 (L) 06/26/2018   HCT 30.6 (L) 06/26/2018   MCV 89.4 06/26/2018   PLT 223 06/26/2018   NEUTROABS 4.9 06/27/2015   Lab Results  Component Value Date   NA 135 06/26/2018   K 4.6 06/26/2018   CL 101 06/26/2018   CO2 25 06/26/2018   GLUCOSE 195 (H) 06/26/2018   CREATININE 0.96 06/26/2018   CALCIUM 8.9 06/26/2018      RADIOGRAPHY: Ct Chest W Contrast  Result Date: 07/20/2018 CLINICAL DATA:  New diagnosis endometrial carcinoma status post complete gastrectomy 06/25/2018. EXAM: CT CHEST, ABDOMEN, AND PELVIS WITH CONTRAST TECHNIQUE: Multidetector CT imaging of the chest, abdomen and pelvis was performed following the standard protocol during bolus administration of intravenous contrast. CONTRAST:  140mL OMNIPAQUE IOHEXOL 300 MG/ML  SOLN COMPARISON:  None. FINDINGS: CT CHEST FINDINGS Cardiovascular: The heart is normal in size. No pericardial effusion. Mild tortuosity and calcification of the thoracic aorta. Scattered coronary artery calcifications. Mediastinum/Nodes: Bulky mediastinal  and hilar lymphadenopathy with calcifications consistent with chronic known pulmonary sarcoidosis. The esophagus is grossly normal.  Small hiatal hernia. Lungs/Pleura: Significant chronic lung disease consistent with known sarcoidosis. Findings are progressive. 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 I do not see any definite findings suspicious for pulmonary metastatic disease. Musculoskeletal: No breast masses, supraclavicular or axillary adenopathy. Small scattered lymph nodes appears stable. No worrisome bone lesions. CT ABDOMEN PELVIS FINDINGS Hepatobiliary: No focal hepatic lesions to suggest metastatic disease. The  gallbladder is surgically absent. No common bile duct dilatation. Pancreas: No mass, inflammation or ductal dilatation. Spleen: Normal size.  No focal lesions. Adrenals/Urinary Tract: The adrenal glands and kidneys are unremarkable. The bladder is normal. Stomach/Bowel: The stomach, duodenum, small bowel and colon are unremarkable. No acute inflammatory changes, mass lesions or obstructive findings. The terminal ileum and appendix are normal. Vascular/Lymphatic: No mesenteric or retroperitoneal mass or adenopathy. Small scattered lymph nodes are noted but appears stable since 2009 CT scan. Moderate atherosclerotic calcifications involving the aorta and branch vessels but no aneurysm or dissection. The major venous structures are patent. Reproductive: Surgically absent. Other: Expected postoperative changes in the pelvis. I do not see any findings suspicious for residual tumor. No pelvic lymphadenopathy. 10 mm right common femoral lymph node is unchanged since 2009. Small bilateral inguinal lymph nodes are noted. No adenopathy or implants in the sigmoid mesocolon. No omental or peritoneal surface lesions are identified. Musculoskeletal: No significant bony findings. IMPRESSION: 1. Surgical changes from recent hysterectomy. No findings suspicious for residual tumor or adenopathy in the pelvis. 2. No CT findings to suggest omental, peritoneal surface or solid organ metastatic disease in the abdomen. 3. Changes consistent with known sarcoidosis in the chest with bulky calcified mediastinal and hilar lymph nodes and interstitial lung disease. I do not see any definite CT findings to suggest metastatic disease involving the chest. Electronically Signed   By: Marijo Sanes M.D.   On: 07/20/2018 15:52   Ct Abdomen Pelvis W Contrast  Result Date: 07/20/2018 CLINICAL DATA:  New diagnosis endometrial carcinoma status post complete gastrectomy 06/25/2018. EXAM: CT CHEST, ABDOMEN, AND PELVIS WITH CONTRAST TECHNIQUE:  Multidetector CT imaging of the chest, abdomen and pelvis was performed following the standard protocol during bolus administration of intravenous contrast. CONTRAST:  18mL OMNIPAQUE IOHEXOL 300 MG/ML  SOLN COMPARISON:  None. FINDINGS: CT CHEST FINDINGS Cardiovascular: The heart is normal in size. No pericardial effusion. Mild tortuosity and calcification of the thoracic aorta. Scattered coronary artery calcifications. 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 The esophagus is grossly normal.  Small hiatal hernia. Lungs/Pleura: Significant chronic lung disease consistent with known sarcoidosis. Findings are progressive. 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 I do not see any definite findings suspicious for pulmonary metastatic disease. Musculoskeletal: No breast masses, supraclavicular or axillary adenopathy. Small scattered lymph nodes appears stable. No worrisome bone lesions. CT ABDOMEN PELVIS FINDINGS Hepatobiliary: No focal hepatic lesions to suggest metastatic disease. The gallbladder is surgically absent. No common bile duct dilatation. Pancreas: No mass, inflammation or ductal dilatation. Spleen: Normal size.  No focal lesions. Adrenals/Urinary Tract: The adrenal glands and kidneys are unremarkable. The bladder is normal. Stomach/Bowel: The stomach, duodenum, small bowel and colon are unremarkable. No acute inflammatory changes, mass lesions or obstructive findings. The terminal ileum and appendix are normal. Vascular/Lymphatic: No mesenteric or retroperitoneal mass or adenopathy. Small scattered lymph nodes are noted but appears stable since  2009 CT scan. Moderate atherosclerotic calcifications involving the aorta and branch vessels but no aneurysm or dissection. The major venous structures are patent. Reproductive: Surgically absent. Other: Expected postoperative changes in the pelvis.  I do not see any findings suspicious for residual tumor. No pelvic lymphadenopathy. 10 mm right common femoral lymph node is unchanged since 2009. Small bilateral inguinal lymph nodes are noted. No adenopathy or implants in the sigmoid mesocolon. No omental or peritoneal surface lesions are identified. Musculoskeletal: No significant bony findings. IMPRESSION: 1. Surgical changes from recent hysterectomy. No findings suspicious for residual tumor or adenopathy in the pelvis. 2. No CT findings to suggest omental, peritoneal surface or solid organ metastatic disease in the abdomen. 3. Changes consistent with known sarcoidosis in the chest with bulky calcified mediastinal and hilar lymph nodes and interstitial lung disease. I do not see any definite CT findings to suggest metastatic disease involving the chest. Electronically Signed   By: Marijo Sanes M.D.   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 Pathology significant for isolated tumor cells in both nodes recovered. Given this finding one could make a case for consideration for external beam radiation therapy directed at the pelvis in addition to brachytherapy treatments.  I carefully reviewed the patient's previous radiation treatment fields for her anal carcinoma. Given that a large volume of the pelvis was treated with  external beam she would not be a candidate for additional external beam radiation treatment as part of her management but would be a candidate for brachytherapy treatments. The estimated dose to the vaginal cuff area was between 36 and 57 Gy with her previous radiation treatment and this treatment took place several years ago (14 years ago).   Today, I talked to the patient and family about the findings and work-up thus far.  We discussed the natural history of endometrial cancer and general treatment, highlighting the role of   brachytherapy in the management.  We discussed the available radiation techniques, and focused on the details of logistics and delivery.  We reviewed the anticipated acute and late sequelae associated with radiation in this setting.  The patient was encouraged to ask questions that I answered to the best of my ability. The patient would like to proceed with radiation and will be scheduled for CT simulation after appropriate recuperation from surgery.  PLAN: She will be scheduled for brachytherapy treatments 6-8 weeks out from her surgery depending on her recovery. She will be seen by Dr. Denman George tomorrow for examination. Anticipate 5 brachytherapy treatments directed at the vaginal cuff.     ------------------------------------------------  Blair Promise, PhD, MD  This document serves as a record of services personally performed by Gery Pray, MD. It was created on his behalf by Arlyce Harman, a trained medical scribe. The creation of this record is based on the scribe's personal observations and the provider's statements to them. This document has been checked and approved by the attending provider.

## 2018-07-23 ENCOUNTER — Inpatient Hospital Stay: Payer: PRIVATE HEALTH INSURANCE | Attending: Gynecologic Oncology | Admitting: Gynecologic Oncology

## 2018-07-23 ENCOUNTER — Encounter: Payer: Self-pay | Admitting: Gynecologic Oncology

## 2018-07-23 VITALS — BP 176/87 | HR 60 | Temp 98.3°F | Resp 18 | Ht 65.5 in | Wt 226.0 lb

## 2018-07-23 DIAGNOSIS — C541 Malignant neoplasm of endometrium: Secondary | ICD-10-CM | POA: Insufficient documentation

## 2018-07-23 DIAGNOSIS — Z90710 Acquired absence of both cervix and uterus: Secondary | ICD-10-CM | POA: Insufficient documentation

## 2018-07-23 DIAGNOSIS — Z7189 Other specified counseling: Secondary | ICD-10-CM

## 2018-07-23 DIAGNOSIS — Z90722 Acquired absence of ovaries, bilateral: Secondary | ICD-10-CM | POA: Insufficient documentation

## 2018-07-23 DIAGNOSIS — Z923 Personal history of irradiation: Secondary | ICD-10-CM | POA: Insufficient documentation

## 2018-07-23 DIAGNOSIS — Z87891 Personal history of nicotine dependence: Secondary | ICD-10-CM | POA: Insufficient documentation

## 2018-07-23 DIAGNOSIS — Z9221 Personal history of antineoplastic chemotherapy: Secondary | ICD-10-CM | POA: Insufficient documentation

## 2018-07-23 NOTE — Patient Instructions (Signed)
Dr Denman George is recommending that you follow-up with Dr Sondra Come for radiation. Dr Clabe Seal office will contact [place] to schedule follow-up after completion of therapy.  Please notify Dr Serita Grit office at [phone] with questions. Please request that the office assisting in helping with Medicaid application contacts [place] at the above number to assist in documentation.

## 2018-08-18 ENCOUNTER — Telehealth: Payer: Self-pay | Admitting: *Deleted

## 2018-08-18 NOTE — Telephone Encounter (Signed)
Called patient to remind of New HDR [place] for 08/19/18, lvm for a return call.

## 2018-08-19 ENCOUNTER — Ambulatory Visit
Admission: RE | Admit: 2018-08-19 | Discharge: 2018-08-19 | Disposition: A | Discharge status: Home / Self Care | Payer: PRIVATE HEALTH INSURANCE | Source: Ambulatory Visit | Attending: Radiation Oncology | Admitting: Radiation Oncology

## 2018-08-19 ENCOUNTER — Encounter: Payer: Self-pay | Admitting: Oncology

## 2018-08-19 ENCOUNTER — Encounter: Payer: Self-pay | Admitting: Radiation Oncology

## 2018-08-19 ENCOUNTER — Other Ambulatory Visit: Payer: Self-pay

## 2018-08-19 VITALS — BP 158/80 | HR 63 | Temp 97.9°F | Resp 18 | Ht 65.5 in | Wt 227.0 lb

## 2018-08-19 DIAGNOSIS — D181 Lymphangioma, any site: Secondary | ICD-10-CM | POA: Insufficient documentation

## 2018-08-19 DIAGNOSIS — Z91013 Allergy to seafood: Secondary | ICD-10-CM | POA: Insufficient documentation

## 2018-08-19 DIAGNOSIS — C541 Malignant neoplasm of endometrium: Secondary | ICD-10-CM | POA: Insufficient documentation

## 2018-08-19 DIAGNOSIS — Z79899 Other long term (current) drug therapy: Secondary | ICD-10-CM | POA: Insufficient documentation

## 2018-08-19 DIAGNOSIS — C210 Malignant neoplasm of anus, unspecified: Secondary | ICD-10-CM | POA: Diagnosis not present

## 2018-08-19 DIAGNOSIS — C211 Malignant neoplasm of anal canal: Secondary | ICD-10-CM

## 2018-08-19 DIAGNOSIS — Z880 Allergy status to penicillin: Secondary | ICD-10-CM | POA: Insufficient documentation

## 2018-08-19 NOTE — Progress Notes (Signed)
  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 She will be treated with a 3 cm diameter segmented cylinder. This conforms to her anatomy without undue discomfort.  Vaginal brachytherapy procedure node: The patient was brought to the [place] suite. Identity was confirmed. All relevant records and images related to the planned course of therapy were reviewed. The patient freely provided informed written consent to proceed with treatment after reviewing the details related to the planned course of therapy. The consent form was witnessed and verified by the simulation staff. Then, the patient was set-up in a stable reproducible supine position for radiation therapy. Pelvic exam revealed the vaginal cuff to be intact. The patient's custom vaginal cylinder was placed in the proximal vagina. This was affixed to the CT/MR stabilization plate to prevent slippage. Patient tolerated the placement well.  Verification simulation note:  A fiducial marker was placed within the vaginal cylinder. An AP and lateral film was then obtained through the pelvis area. This documented accurate position of the vaginal cylinder for treatment.  HDR BRACHYTHERAPY TREATMENT  The remote afterloading device was affixed to the vaginal cylinder by catheter. Patient then proceeded to undergo her first high-dose-rate treatment directed at the proximal vagina. The patient was prescribed a dose of 6.0 gray to be delivered to the mucosal surface. Treatment length was 3 cm. Patient was treated with 1 channel using 7 dwell positions. Treatment time was 288.60 seconds. Iridium  192 was the high-dose-rate source for treatment. The patient tolerated the treatment well. After completion of her therapy, a radiation survey was performed documenting return of the iridium source into the [employer] safe.   PLAN: Patient will return next week for her second high-dose-rate treatment. ________________________________  Blair Promise, PhD, MD   This document serves as a record of services personally performed by Gery Pray, MD. It was created on his behalf by Wilburn Mylar, a trained medical scribe. The creation of this record is based on the scribe's personal observations and the provider's statements to them. This document has been checked and approved by the attending provider.

## 2018-08-19 NOTE — Progress Notes (Signed)
Pt presents today for new [place] HDR. Pt denies dysuria/hematuria. Pt denies vaginal discharge/bleeding. Pt denies rectal bleeding, diarrhea/constipation. Pt is accompanied by son and daughter. This RN provided education to pt and family as to what to expect today and subsequent treatments. [name], RN BSN   BP (!) 158/80 (BP Location: Right Arm, Patient Position: Sitting)   Pulse 63   Temp 97.9 F (36.6 C) (Oral)   Resp 18   Ht 5' 5.5" (1.664 m)   Wt 227 lb (103 kg)   LMP 12/17/2003   SpO2 98%   BMI 37.20 kg/m   Wt Readings from Last 3 Encounters:  08/19/18 227 lb (103 kg)  07/23/18 226 lb (102.5 kg)  07/22/18 227 lb (103 kg)

## 2018-08-19 NOTE — Progress Notes (Signed)
  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   NARRATIVE:  The patient was brought to the [facility].  Identity was confirmed.  All relevant records and images related to the planned course of therapy were reviewed.  The patient freely provided informed written consent to proceed with treatment after reviewing the details related to the planned course of therapy. The consent form was witnessed and verified by the simulation staff.  Then, the patient was set-up in a stable reproducible  supine position for radiation therapy.  CT images were obtained.  Surface markings were placed.  The CT images were loaded into the planning software.  Then the target and avoidance structures were contoured.  Treatment planning then occurred.  The radiation prescription was entered and confirmed.   I have requested : Brachytherapy Isodose Plan and Dosimetry Calculations to plan the radiation distribution.    PLAN:  The patient will receive 30 Gy in 5 fraction. Iridium 192 will be the high-dose-rate source. Patient will be treated with a 3 cm diameter cylinder with a treatment length of 3 cm. Prescription will be to the mucosal surface.    ________________________________  Blair Promise, PhD, MD  This document serves as a record of services personally performed by Gery Pray, MD. It was created on his behalf by Wilburn Mylar, a trained medical scribe. The creation of this record is based on the scribe's personal observations and the provider's statements to them. This document has been checked and approved by the attending provider.

## 2018-08-19 NOTE — Progress Notes (Addendum)
Radiation Oncology         [phone] ________________________________  Name: [name] MRN: [id number]  Date: 08/19/2018  DOB: 09/18/1956  Vaginal Brachytherapy Procedure Note  CC: [name], [name], MD Everitt Amber, MD    ICD-10-CM   1. 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 He has been recovering well from her surgery. She denies any vaginal bleeding or vaginal discharge. She denies any pelvic pain. 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  ALLERGIES: is allergic to shellfish allergy and penicillins.  Meds: Current Outpatient Medications  Medication Sig Dispense Refill  . albuterol (PROVENTIL HFA;VENTOLIN HFA) 108 (90 Base) MCG/ACT inhaler Inhale 2 puffs into the lungs every 4 (four) hours as needed for wheezing or shortness of breath. 1 Inhaler 5  . amLODipine (NORVASC) 10 MG tablet Take 1 tablet (10 mg total) by mouth daily. 90 tablet 0  . bisoprolol (ZEBETA) 10 MG tablet Take 1 tablet (10 mg total) by mouth daily. 90 tablet 1  . Dextromethorphan-Menthol (DELSYM COUGH RELIEF MT) Take 5 mLs by mouth daily as needed (cough).     . famotidine (PEPCID) 20 MG tablet Take 1 tablet (20 mg total) by mouth at bedtime. 30 tablet 11  . hydrochlorothiazide (HYDRODIURIL) 25 MG tablet Take 1 tablet (25 mg total) by mouth every morning. 30 tablet 0  . oxyCODONE (OXY IR/ROXICODONE) 5 MG immediate release tablet Take 1 tablet (5 mg total) by mouth every 4 (four) hours as needed for severe pain. 30 tablet 0  . phenylephrine (SUDAFED PE) 10 MG TABS tablet Take 10 mg by mouth every 4 (four) hours as needed (allergies).     . predniSONE (DELTASONE) 1 MG tablet Take 1 mg by  mouth daily with breakfast.    . RABEprazole (ACIPHEX) 20 MG tablet Take 20 mg by mouth daily before breakfast.     . simvastatin (ZOCOR) 20 MG tablet Take 20 mg by mouth daily.    . traMADol (ULTRAM) 50 MG tablet 1-2 every 4 hours as needed for cough or pain (Patient taking differently: Take 50 mg by mouth every 4 (four) hours as needed for moderate pain (cough). ) 40 tablet 0  . Triamcinolone Acetonide (TRIAMCINOLONE 0.1 % CREAM : EUCERIN) CREA Apply 1 application topically daily as needed for rash or itching. (Patient taking differently: Apply 1 application topically daily. ) 500 each 0   No current facility-administered medications for this encounter.     Physical Findings: The patient is in no acute distress. Patient is alert and oriented.  height is 5' 5.5" (1.664 m) and weight is 227 lb (103 kg). Her oral temperature is 97.9 F (36.6 C). Her blood pressure is 158/80 (abnormal) and her pulse is 63. Her respiration is 18 and oxygen saturation is 98%.   No palpable cervical, supraclavicular or axillary lymphoadenopathy. The heart has a regular rate and rhythm. The lungs are clear to auscultation. Abdomen soft and non-tender.  On pelvic examination the external genitalia were unremarkable. 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  A speculum exam was performed. Vaginal cuff intact, no mucosal lesions. On bimanual exam there were no pelvic masses appreciated. Some sutures still palpable.  Lab Findings: Lab Results  Component Value Date   WBC 9.2 06/26/2018   HGB 10.8 (L) 06/26/2018   HCT 30.6 (L) 06/26/2018   MCV 89.4 06/26/2018   PLT 223 06/26/2018    Radiographic Findings: Ct Chest W Contrast  Result Date: 07/20/2018 CLINICAL DATA:  New diagnosis endometrial carcinoma status post complete gastrectomy 06/25/2018. EXAM: CT CHEST, ABDOMEN, AND PELVIS WITH CONTRAST TECHNIQUE: Multidetector CT imaging of the chest, abdomen and pelvis was performed following the standard  protocol during bolus administration of intravenous contrast. CONTRAST:  136mL OMNIPAQUE IOHEXOL 300 MG/ML  SOLN COMPARISON:  None. FINDINGS: CT CHEST FINDINGS Cardiovascular: The heart is normal in size. No pericardial effusion. Mild tortuosity and calcification of the thoracic aorta. Scattered coronary artery calcifications. 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 The esophagus is grossly normal.  Small hiatal hernia. Lungs/Pleura: Significant chronic lung disease consistent with known sarcoidosis. Findings are progressive. 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 I do not see any definite findings suspicious for pulmonary metastatic disease. Musculoskeletal: No breast masses, supraclavicular or axillary adenopathy. Small scattered lymph nodes appears stable. No worrisome bone lesions. CT ABDOMEN PELVIS FINDINGS Hepatobiliary: No focal hepatic lesions to suggest metastatic disease. The gallbladder is surgically absent. No common bile duct dilatation. Pancreas: No mass, inflammation or ductal dilatation. Spleen: Normal size.  No focal lesions. Adrenals/Urinary Tract: The adrenal glands and kidneys are unremarkable. The bladder is normal. Stomach/Bowel: The stomach, duodenum, small bowel and colon are unremarkable. No acute inflammatory changes, mass lesions or obstructive findings. The terminal ileum and appendix are normal. Vascular/Lymphatic: No mesenteric or retroperitoneal mass or adenopathy. Small scattered lymph nodes are noted but appears stable since 2009 CT scan. Moderate atherosclerotic calcifications involving the aorta and branch vessels but no aneurysm or dissection. The major venous structures are patent. Reproductive: Surgically absent. Other: Expected postoperative changes in the pelvis. I do not see any findings suspicious for residual tumor. No pelvic lymphadenopathy. 10 mm right  common femoral lymph node is unchanged since 2009. Small bilateral inguinal lymph nodes are noted. No adenopathy or implants in the sigmoid mesocolon. No omental or peritoneal surface lesions are identified. Musculoskeletal: No significant bony findings. IMPRESSION: 1. Surgical changes from recent hysterectomy. No findings suspicious for residual tumor or adenopathy in the pelvis. 2. No CT findings to suggest omental, peritoneal surface or solid organ metastatic disease in the abdomen. 3. Changes consistent with known sarcoidosis in the chest with bulky calcified mediastinal and hilar lymph nodes and interstitial lung disease. I do not see any definite CT findings to suggest metastatic disease involving the chest. Electronically Signed   By: Marijo Sanes M.D.   On: 07/20/2018 15:52   Ct Abdomen Pelvis W Contrast  Result Date: 07/20/2018 CLINICAL DATA:  New diagnosis endometrial carcinoma status post complete gastrectomy 06/25/2018. EXAM: CT CHEST, ABDOMEN, AND PELVIS WITH CONTRAST TECHNIQUE: Multidetector CT imaging of the chest, abdomen and pelvis was performed following the standard protocol during bolus administration of intravenous contrast. CONTRAST:  138mL OMNIPAQUE IOHEXOL 300 MG/ML  SOLN COMPARISON:  None. FINDINGS: CT CHEST FINDINGS Cardiovascular: The heart is normal in size. No pericardial effusion. Mild tortuosity and calcification of the thoracic aorta. Scattered coronary artery calcifications. 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 The esophagus is grossly normal.  Small hiatal hernia. Lungs/Pleura: Significant chronic lung disease consistent with known sarcoidosis. Findings are progressive. There are new nodular areas but this is likely  alveolar sarcoid. I do not see any definite findings suspicious for pulmonary metastatic disease. Musculoskeletal: No breast masses, supraclavicular or axillary  adenopathy. Small scattered lymph nodes appears stable. No worrisome bone lesions. CT ABDOMEN PELVIS FINDINGS Hepatobiliary: No focal hepatic lesions to suggest metastatic disease. The gallbladder is surgically absent. No common bile duct dilatation. Pancreas: No mass, inflammation or ductal dilatation. Spleen: Normal size.  No focal lesions. Adrenals/Urinary Tract: The adrenal glands and kidneys are unremarkable. The bladder is normal. Stomach/Bowel: The stomach, duodenum, small bowel and colon are unremarkable. No acute inflammatory changes, mass lesions or obstructive findings. The terminal ileum and appendix are normal. Vascular/Lymphatic: No mesenteric or retroperitoneal mass or adenopathy. Small scattered lymph nodes are noted but appears stable since 2009 CT scan. Moderate atherosclerotic calcifications involving the aorta and branch vessels but no aneurysm or dissection. The major venous structures are patent. Reproductive: Surgically absent. Other: Expected postoperative changes in the pelvis. I do not see any findings suspicious for residual tumor. No pelvic lymphadenopathy. 10 mm right common femoral lymph node is unchanged since 2009. Small bilateral inguinal lymph nodes are noted. No adenopathy or implants in the sigmoid mesocolon. No omental or peritoneal surface lesions are identified. Musculoskeletal: No significant bony findings. IMPRESSION: 1. Surgical changes from recent hysterectomy. No findings suspicious for residual tumor or adenopathy in the pelvis. 2. No CT findings to suggest omental, peritoneal surface or solid organ metastatic disease in the abdomen. 3. Changes consistent with known sarcoidosis in the chest with bulky calcified mediastinal and hilar lymph nodes and interstitial lung disease. I do not see any definite CT findings to suggest metastatic disease involving the chest. Electronically Signed   By: Marijo Sanes M.D.   On: 07/20/2018 15:52    Impression: Stage IB grade 1  carcinoma of endometrium with high/intermediate risk factors (deep myo invasion, LVSI)  Patient was fitted for a vaginal cylinder. The patient will be treated with a 3 cm diameter cylinder with a treatment length of 3.0 cm. This distended the vaginal vault without undue discomfort. The patient tolerated the procedure well.  The patient was successfully fitted for a vaginal cylinder. The patient is appropriate to begin vaginal brachytherapy.   Plan: The patient will proceed with CT simulation and vaginal brachytherapy today.    _______________________________   Blair Promise, PhD, MD  This document serves as a record of services personally performed by Gery Pray, MD. It was created on his behalf by Wilburn Mylar, a trained medical scribe. The creation of this record is based on the scribe's personal observations and the provider's statements to them. This document has been checked and approved by the attending provider.

## 2018-08-20 ENCOUNTER — Ambulatory Visit: Payer: PRIVATE HEALTH INSURANCE | Admitting: Radiation Oncology

## 2018-08-26 ENCOUNTER — Ambulatory Visit
Admission: RE | Admit: 2018-08-26 | Discharge: 2018-08-26 | Disposition: A | Discharge status: Home / Self Care | Payer: PRIVATE HEALTH INSURANCE | Source: Ambulatory Visit | Attending: Radiation Oncology | Admitting: Radiation Oncology

## 2018-08-26 DIAGNOSIS — C541 Malignant neoplasm of endometrium: Secondary | ICD-10-CM | POA: Diagnosis not present

## 2018-08-26 NOTE — Progress Notes (Signed)
  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 She will be treated with a 3 cm diameter segmented cylinder. This conforms to her anatomy without undue discomfort.  Vaginal brachytherapy procedure node: The patient was brought to the [facility] suite. Identity was confirmed. All relevant records and images related to the planned course of therapy were reviewed. The patient freely provided informed written consent to proceed with treatment after reviewing the details related to the planned course of therapy. The consent form was witnessed and verified by the simulation staff. Then, the patient was set-up in a stable reproducible supine position for radiation therapy. Pelvic exam revealed the vaginal cuff to be intact. The patient's custom vaginal cylinder was placed in the proximal vagina. This was affixed to the CT/MR stabilization plate to prevent slippage. Patient tolerated the placement well.  Verification simulation note:  A fiducial marker was placed within the vaginal cylinder. An AP and lateral film was then obtained through the pelvis area. This documented accurate position of the vaginal cylinder for treatment.  HDR BRACHYTHERAPY TREATMENT  The remote afterloading device was affixed to the vaginal cylinder by catheter. Patient then proceeded to undergo her second high-dose-rate treatment directed at the proximal vagina. The patient was prescribed a dose of 6.0 gray to be delivered to the mucosal surface. Treatment length was 3 cm. Patient was treated with 1 channel using 7 dwell positions. Treatment time was 308.30 seconds.  Iridium 192 was the high-dose-rate source for treatment. The patient tolerated the treatment well. After completion of her therapy, a radiation survey was performed documenting return of the iridium source into the Gamma[employer] safe.   PLAN: Patient will return next week for her third high-dose-rate treatment. ________________________________  Blair Promise, PhD, MD   This document serves as a record of services personally performed by Gery Pray, MD. It was created on his behalf by [facility], a trained medical scribe. The creation of this record is based on the scribe's personal observations and the provider's statements to them. This document has been checked and approved by the attending provider.

## 2018-08-27 ENCOUNTER — Ambulatory Visit: Payer: PRIVATE HEALTH INSURANCE | Admitting: Radiation Oncology

## 2018-09-02 ENCOUNTER — Ambulatory Visit
Admission: RE | Admit: 2018-09-02 | Discharge: 2018-09-02 | Disposition: A | Discharge status: Home / Self Care | Payer: PRIVATE HEALTH INSURANCE | Source: Ambulatory Visit | Attending: Radiation Oncology | Admitting: Radiation Oncology

## 2018-09-02 DIAGNOSIS — C541 Malignant neoplasm of endometrium: Secondary | ICD-10-CM

## 2018-09-02 NOTE — Progress Notes (Signed)
  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 She will be treated with a 3 cm diameter segmented cylinder. This conforms to her anatomy without undue discomfort.  Vaginal brachytherapy procedure node: The patient was brought to the [facility] suite. Identity was confirmed. All relevant records and images related to the planned course of therapy were reviewed. The patient freely provided informed written consent to proceed with treatment after reviewing the details related to the planned course of therapy. The consent form was witnessed and verified by the simulation staff. Then, the patient was set-up in a stable reproducible supine position for radiation therapy. Pelvic exam revealed the vaginal cuff to be intact. The patient's custom vaginal cylinder was placed in the proximal vagina. This was affixed to the CT/MR stabilization plate to prevent slippage. Patient tolerated the placement well.  Verification simulation note:  A fiducial marker was placed within the vaginal cylinder. An AP and lateral film was then obtained through the pelvis area. This documented accurate position of the vaginal cylinder for treatment.  HDR BRACHYTHERAPY TREATMENT  The remote afterloading device was affixed to the vaginal cylinder by catheter. Patient then proceeded to undergo her third high-dose-rate treatment directed at the proximal vagina. The patient was prescribed a dose of 6.0 gray to be delivered to the mucosal surface. Treatment length was 3 cm. Patient was treated with 1 channel using 7 dwell positions. Treatment time was 329.1 seconds. Iridium  192 was the high-dose-rate source for treatment. The patient tolerated the treatment well. After completion of her therapy, a radiation survey was performed documenting return of the iridium source into the [employer] safe.   PLAN: Patient will return next week for her fourth high-dose-rate treatment. ________________________________  Blair Promise, PhD, MD   This document serves as a record of services personally performed by Gery Pray, MD. It was created on his behalf by Wilburn Mylar, a trained medical scribe. The creation of this record is based on the scribe's personal observations and the provider's statements to them. This document has been checked and approved by the attending provider.

## 2018-09-03 ENCOUNTER — Ambulatory Visit: Payer: PRIVATE HEALTH INSURANCE | Admitting: Radiation Oncology

## 2018-09-08 ENCOUNTER — Telehealth: Payer: Self-pay | Admitting: *Deleted

## 2018-09-08 NOTE — Telephone Encounter (Signed)
CALLED PATIENT TO REMIND OF HDR [facility] 09-09-18 @ 10 AM, SPOKE WITH PATIENT AND SHE IS AWARE OF THIS [place].

## 2018-09-09 ENCOUNTER — Ambulatory Visit
Admission: RE | Admit: 2018-09-09 | Discharge: 2018-09-09 | Disposition: A | Discharge status: Home / Self Care | Payer: PRIVATE HEALTH INSURANCE | Source: Ambulatory Visit | Attending: Radiation Oncology | Admitting: Radiation Oncology

## 2018-09-09 DIAGNOSIS — C541 Malignant neoplasm of endometrium: Secondary | ICD-10-CM

## 2018-09-09 NOTE — Progress Notes (Signed)
  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 She will be treated with a 3 cm diameter segmented cylinder. This conforms to her anatomy without undue discomfort.  Vaginal brachytherapy procedure node: The patient was brought to the [facility] suite. Identity was confirmed. All relevant records and images related to the planned course of therapy were reviewed. The patient freely provided informed written consent to proceed with treatment after reviewing the details related to the planned course of therapy. The consent form was witnessed and verified by the simulation staff. Then, the patient was set-up in a stable reproducible supine position for radiation therapy. Pelvic exam revealed the vaginal cuff to be intact. The patient's custom vaginal cylinder was placed in the proximal vagina. This was affixed to the CT/MR stabilization plate to prevent slippage. Patient tolerated the placement well.  Verification simulation note:  A fiducial marker was placed within the vaginal cylinder. An AP and lateral film was then obtained through the pelvis area. This documented accurate position of the vaginal cylinder for treatment.  HDR BRACHYTHERAPY TREATMENT  The remote afterloading device was affixed to the vaginal cylinder by catheter. Patient then proceeded to undergo her fourth high-dose-rate treatment directed at the proximal vagina. The patient was prescribed a dose of 6.0 gray to be delivered to the mucosal surface. Treatment length was 3 cm. Patient was treated with 1 channel using 7 dwell positions. Treatment time was 351.70 seconds.  Iridium 192 was the high-dose-rate source for treatment. The patient tolerated the treatment well. After completion of her therapy, a radiation survey was performed documenting return of the iridium source into the GammaMed safe.   PLAN: Patient will return next week for her fifth high-dose-rate treatment. ________________________________  Blair Promise, PhD, MD   This document serves as a record of services personally performed by Gery Pray, MD. It was created on his behalf by [facility], a trained medical scribe. The creation of this record is based on the scribe's personal observations and the provider's statements to them. This document has been checked and approved by the attending provider.

## 2018-09-10 ENCOUNTER — Ambulatory Visit: Payer: PRIVATE HEALTH INSURANCE | Admitting: Radiation Oncology

## 2018-09-16 ENCOUNTER — Telehealth: Payer: Self-pay | Admitting: *Deleted

## 2018-09-16 NOTE — Telephone Encounter (Signed)
Called patient to remind of [place]. for 09-17-18 @ 1:30 pm, spoke with patient and she is aware of this tx.

## 2018-09-17 ENCOUNTER — Ambulatory Visit (INDEPENDENT_AMBULATORY_CARE_PROVIDER_SITE_OTHER)
Admission: RE | Admit: 2018-09-17 | Discharge: 2018-09-17 | Disposition: A | Discharge status: Home / Self Care | Payer: PRIVATE HEALTH INSURANCE | Source: Ambulatory Visit | Attending: Internal Medicine | Admitting: Internal Medicine

## 2018-09-17 ENCOUNTER — Encounter: Payer: Self-pay | Admitting: Internal Medicine

## 2018-09-17 ENCOUNTER — Other Ambulatory Visit (INDEPENDENT_AMBULATORY_CARE_PROVIDER_SITE_OTHER): Payer: PRIVATE HEALTH INSURANCE

## 2018-09-17 ENCOUNTER — Ambulatory Visit
Admission: RE | Admit: 2018-09-17 | Discharge: 2018-09-17 | Disposition: A | Discharge status: Home / Self Care | Payer: PRIVATE HEALTH INSURANCE | Source: Ambulatory Visit | Attending: Radiation Oncology | Admitting: Radiation Oncology

## 2018-09-17 ENCOUNTER — Ambulatory Visit (INDEPENDENT_AMBULATORY_CARE_PROVIDER_SITE_OTHER): Payer: PRIVATE HEALTH INSURANCE | Admitting: Internal Medicine

## 2018-09-17 VITALS — BP 140/80 | HR 65 | Ht 64.0 in | Wt 227.8 lb

## 2018-09-17 DIAGNOSIS — R0609 Other forms of dyspnea: Secondary | ICD-10-CM

## 2018-09-17 DIAGNOSIS — C541 Malignant neoplasm of endometrium: Secondary | ICD-10-CM | POA: Diagnosis present

## 2018-09-17 DIAGNOSIS — D869 Sarcoidosis, unspecified: Secondary | ICD-10-CM | POA: Diagnosis not present

## 2018-09-17 LAB — CBC WITH DIFFERENTIAL/PLATELET
BASOS ABS: 0 10*3/uL (ref 0.0–0.1)
Basophils Relative: 0.4 % (ref 0.0–3.0)
Eosinophils Absolute: 0.4 10*3/uL (ref 0.0–0.7)
Eosinophils Relative: 4.7 % (ref 0.0–5.0)
HEMATOCRIT: 37.4 % (ref 36.0–46.0)
HEMOGLOBIN: 13 g/dL (ref 12.0–15.0)
LYMPHS PCT: 18.3 % (ref 12.0–46.0)
Lymphs Abs: 1.4 10*3/uL (ref 0.7–4.0)
MCHC: 34.7 g/dL (ref 30.0–36.0)
MCV: 91.5 fl (ref 78.0–100.0)
Monocytes Absolute: 0.8 10*3/uL (ref 0.1–1.0)
Monocytes Relative: 9.9 % (ref 3.0–12.0)
Neutro Abs: 5.1 10*3/uL (ref 1.4–7.7)
Neutrophils Relative %: 66.7 % (ref 43.0–77.0)
Platelets: 228 10*3/uL (ref 150.0–400.0)
RBC: 4.09 Mil/uL (ref 3.87–5.11)
RDW: 14.4 % (ref 11.5–15.5)
WBC: 7.7 10*3/uL (ref 4.0–10.5)

## 2018-09-17 LAB — BASIC METABOLIC PANEL
BUN: 14 mg/dL (ref 6–23)
CO2: 29 meq/L (ref 19–32)
Calcium: 9.5 mg/dL (ref 8.4–10.5)
Chloride: 99 mEq/L (ref 96–112)
Creatinine, Ser: 0.76 mg/dL (ref 0.40–1.20)
GFR: 99.91 mL/min (ref >60.00)
GLUCOSE: 118 mg/dL — AB (ref 70–99)
POTASSIUM: 3.9 meq/L (ref 3.5–5.1)
SODIUM: 136 meq/L (ref 135–145)

## 2018-09-17 LAB — TSH: TSH: 3.42 u[IU]/mL (ref 0.35–4.50)

## 2018-09-17 LAB — SEDIMENTATION RATE: Sed Rate: 74 mm/hr — ABNORMAL HIGH (ref 0–30)

## 2018-09-17 LAB — BRAIN NATRIURETIC PEPTIDE: Pro B Natriuretic peptide (BNP): 51 pg/mL (ref 0.0–100.0)

## 2018-09-17 NOTE — Progress Notes (Signed)
  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 She will be treated with a 3 cm diameter segmented cylinder. This conforms to her anatomy without undue discomfort.  Vaginal brachytherapy procedure node: The patient was brought to the [facility] suite. Identity was confirmed. All relevant records and images related to the planned course of therapy were reviewed. The patient freely provided informed written consent to proceed with treatment after reviewing the details related to the planned course of therapy. The consent form was witnessed and verified by the simulation staff. Then, the patient was set-up in a stable reproducible supine position for radiation therapy. Pelvic exam revealed the vaginal cuff to be intact. The patient's custom vaginal cylinder was placed in the proximal vagina. This was affixed to the CT/MR stabilization plate to prevent slippage. Patient tolerated the placement well.  Verification simulation note:  A fiducial marker was placed within the vaginal cylinder. An AP and lateral film was then obtained through the pelvis area. This documented accurate position of the vaginal cylinder for treatment.  HDR BRACHYTHERAPY TREATMENT  The remote afterloading device was affixed to the vaginal cylinder by catheter. Patient then proceeded to undergo her fifth high-dose-rate treatment directed at the proximal vagina. The patient was prescribed a dose of 6.0 gray to be delivered to the mucosal surface. Treatment length was 3 cm. Patient was treated with 1 channel using 7 dwell positions. Treatment time was 379.00 seconds.  Iridium 192 was the high-dose-rate source for treatment. The patient tolerated the treatment well. After completion of her therapy, a radiation survey was performed documenting return of the iridium source into the Gamma[employer] safe.   PLAN: Patient has completed brachytherapy treatment. She will follow up in 1 month. ________________________________  Blair Promise, PhD, MD   This document serves as a record of services personally performed by Gery Pray, MD. It was created on his behalf by Wilburn Mylar, a trained medical scribe. The creation of this record is based on the scribe's personal observations and the provider's statements to them. This document has been checked and approved by the attending provider.

## 2018-09-17 NOTE — Progress Notes (Signed)
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  Last chronic prednisone 04/15/08 with no flare cough no nodules (right leg and left hand and right arm)   History of Present Illness  Last ov 02/17/13 no evidence active sarcoid rec Aciphex 20 mg Take 30-60 min before first meal of the day also when coughing for any reason take Pepcid 20 mg one at bedtime  Take delsym two tsp every 12 hours and supplement if needed with  tramadol 50 mg up to 2 every 4 hours to suppress the urge to cough. Swallowing water or using ice chips/non mint and menthol containing candies (such as lifesavers or sugarless jolly ranchers) are also effective.  You should rest your voice and avoid activities that you know make you cough. Once you have eliminated the cough for 3 straight days try reducing the tramadol first,  then the delsym as tolerated.   GERD diet    01/28/2014  Pulmonary consultation / [name]/ on no maint resp rx / rare baseline need for saba  Chief Complaint  Patient presents with  . Pulmonary Consult    Referred per [name]. [name] Cough is prod with moderate yellow sputum. She is using proair approx tid.  Onset was Jan 01 2014 while on aciphex with bfast with flu like illness but not follow contingency rx  On prednisone taper/ sp tamiflu/cough meds  Not limited by breathing from desired activities   rec Aciphex 20 mg Take 30-60 min before first meal of the day also when coughing for any reason take  Pepcid 20 mg one at bedtime and chlortrimeton 4 mg at bedtime Take delsym two tsp every 12 hours and supplement if needed with  tramadol 50 mg up to 2 every 4 hours to suppress the urge to cough.   Once you have eliminated the cough for 3 straight days try reducing the tramadol first,  then the delsym as tolerated.   GERD diet         05/07/2017 acute extended ov/[name] re: cough > sob  Chief Complaint  Patient presents with  . Acute Visit    Cough not improving, non prod "feels like something in my throat". She is waking up in the night coughing.   was doing fine for months since last ov with me but not doing well as well since 02/18/17 when placed on lisinopril and gradually  downhill since on multple courses of prednisone and now symbicort (though very poor hfa) and much worse dry cough 24/7 no longer has med calendar and doesn't even recognize the copy we have / very confused with meds  rec Stop lotensin and start valasartan 325 mg one daily  Start amlodipine 10 mg daily  Work on inhaler technique:  albuterol as a rescue medication to be used if you can't catch your breath For cough use the flutter valve and Take delsym two tsp every 12 hours and supplement if needed with  tramadol 50 mg up to 2 every 4 hours to suppress  the urge to cough   Finish your prednisone Follow the med calendar daily and bring it back with you to all office visits      05/22/2017  f/u ov/[name] re: cough > sob off acei x 2 weeks some better  Chief Complaint  Patient presents with  . Follow-up    Here to go over medication   doe = MMRC2 = can't walk a nl pace on a flat grade s sob but does fine slow and flat   Cough mostly daytime / dry/ never took tramadol correctly rec Take delsym two tsp every 12 hours and supplement if needed with  tramadol 50 mg up to 2 every 4 hours to suppress the urge to cough  Prednisone 10 mg x 2  Each am x 3 days then 1 daily x 3 days and stop.      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 Follow-up    Cough and SOB are some better, but not back to her normal baseline. Her cough is non prod. She also notices some wheezing.   rarely if ever uses saba at hs/ mostly daytime need of over doe it with exertion / no following recs for gerd/ no longer on ppi or h2 hs - much less need for saba than prior to stopping acei  Did not see [name] for med calendar as req / did not take am meds yet  rec Pantoprazole (protonix) 40 mg   Take  30-60 min before first meal of the day and Pepcid (famotidine)  20 mg one @  bedtime until return to office - this is the best way to tell whether stomach acid is contributing to your problem.   GERD diet   09/09/17 [name] ov rec Continue on Prednisone 20mg  daily for 2 weeks then 10mg  daily for 2 weeks then 5mg  daily .   surgery 09/23/17 [name] / Anus:  - ANGIOKERATOMA.  10/21/2017  f/u ov/[name] re:  Sarcoid/ steroid dep / ocular involvmemnt  Chief Complaint  Patient presents with  . Follow-up    Increased cough x 2 wks- non prod. Her breathing is unchanged. She ran out of prednisone approx 2 wks ago and never had it refilled.   confused re details of care  ? Flare of ocular sarcoid off pred > Bowing otc eyedrops for wetting and no further pred eyedrops Breathing worse = doe since cough since about a week or two p stopped pred though instructions say to taper to 5 mg  Not using med calendar/ not updated since 2016  rec Prednisone 5 mg daily      06/16/2018  f/u ov/[name] re: sarcoid preop clearance/ still on pred 5 mg daily - needs TAH/bso Chief Complaint  Patient presents with  . Surgical clearance    She is having a complete hysterectomy and need clearance. Her breathing is okay SOB  with activity. No cough at this time.  Dyspnea:  Across a parking lot = MMRC3 = can't walk 100 yards even at a slow pace at a flat grade s stopping due to sob  Cough:  none SABA use: once in a blue moon 02: no  rec We will refill your bisoprolol and amlodipine for 3 months but after than you should let your PCP take over Please schedule a follow up visit in 3 months but call sooner if needed  July 11 th TAH BSO went fine    09/17/2018  Extended post hosp f/u ov/[name] re: sarcoidosis / sob - not sure how much pred she takes  Chief Complaint  Patient presents with  . Follow-up    Increased SOB [place]. She states she gets SOB walking short distances such as from our parking lot to the building today. She has had to use her recue inhaler a few times recently. She also c/o dry mouth.   Dyspnea:  MMRC3 = can't walk 100 yards even at a slow pace at a flat grade s stopping due to sob  - worse last 4 months thinks has been  on prednisone 1 mg daily the whole time  Still [employer] pushing the cart around  Cough: no Sleeping: on side electric bed maybe 10 degrees  SABA use: says breathing better p saba bid  02: none    No obvious day to day or daytime variability or assoc excess/ purulent sputum or mucus plugs or hemoptysis or cp or chest tightness, subjective wheeze or overt sinus or hb symptoms.   Sleeping as above  without nocturnal  or early am exacerbation  of respiratory  c/o's or need for noct saba. Also denies any obvious fluctuation of symptoms with weather or environmental changes or other aggravating or alleviating factors except as outlined above   No unusual exposure hx or h/o childhood pna/ asthma or knowledge of premature birth.  Current Allergies, Complete Past Medical History, Past Surgical History, Family History, and Social History were reviewed in [employer] record.  ROS  The following are not active complaints unless bolded Hoarseness, sore throat, dysphagia, dental problems, itching, sneezing,  nasal congestion or discharge of excess mucus or purulent secretions, ear ache,    fever, chills, sweats, unintended wt loss or wt gain, classically pleuritic or exertional cp,  orthopnea pnd or arm/hand swelling  or leg swelling, presyncope, palpitations, abdominal pain, anorexia, nausea, vomiting, diarrhea  or change in bowel habits or change in bladder habits, change in stools or change in urine, dysuria, hematuria,  rash, arthralgias, visual complaints, headache, numbness, weakness or ataxia or problems with walking or coordination,  change in mood or  memory.        Current Meds - unable to confirm pred dose  Medication Sig  . albuterol (PROVENTIL HFA;VENTOLIN HFA) 108 (90 Base) MCG/ACT inhaler Inhale 2 puffs into the lungs every 4 (four) hours as needed for wheezing or shortness of breath.  . Dextromethorphan-Menthol (DELSYM COUGH RELIEF MT) Take 5 mLs by mouth daily as needed (cough).   . famotidine (PEPCID) 20 MG tablet Take 1 tablet (20 mg total) by mouth at bedtime.  Marland Kitchen loratadine (CLARITIN) 10 MG tablet Take 10 mg by mouth daily as needed for allergies.  Marland Kitchen oxyCODONE (OXY IR/ROXICODONE) 5 MG immediate release tablet Take 1 tablet (5 mg total) by mouth every 4 (four) hours as needed for severe pain.  . pantoprazole (PROTONIX) 40 MG tablet Take 40 mg by mouth daily.  . phenylephrine (SUDAFED PE) 10 MG TABS tablet Take 10 mg by mouth every 4 (four) hours as needed (allergies).   Vladimir Faster Glycol-Propyl Glycol (SYSTANE) 0.4-0.3 % GEL ophthalmic gel Place 1 application into both eyes as needed.  . prednisoLONE acetate (PRED FORTE) 1 % ophthalmic suspension Place 1 drop into both eyes as needed.  . predniSONE (DELTASONE) 1 MG tablet Take 1 mg by mouth daily with  breakfast.  . simvastatin (ZOCOR) 20 MG tablet Take 20 mg by mouth daily.  . traMADol (ULTRAM) 50 MG tablet 1-2 every 4 hours as needed for cough or pain (Patient taking differently: Take 50 mg by mouth every 4 (four) hours as needed for moderate pain (cough). )  . Triamcinolone Acetonide (TRIAMCINOLONE 0.1 % CREAM :  EUCERIN) CREA Apply 1 application topically daily as needed for rash or itching. (Patient taking differently: Apply 1 application topically daily. )  .  amLODipine (NORVASC) 10 MG tablet Take 1 tablet (10 mg total) by mouth daily.  .  bisoprolol (ZEBETA) 10 MG tablet Take 1 tablet (10 mg total) by mouth daily.  .   hydrochlorothiazide (HYDRODIURIL) 25 MG tablet Take 1 tablet (25 mg total) by mouth every morning.                    Objective:   Physical Exam    Obese somber bf nad   09/17/2018      227  06/16/2018        225  10/21/2017      224  07/07/2017      223  05/22/2017         219  05/07/2017       222  08/22/2016         216   05/17/2016        215   05/02/15 219 lb (99.338 kg)  04/07/15 219 lb 9.6 oz (99.61 kg)  03/24/15 216 lb (97.977 kg)       Vital signs reviewed - Note on arrival 02 sats  96% on RA       HEENT: nl dentition, turbinates bilaterally, and oropharynx. Nl external ear canals without cough reflex   NECK :  without JVD/Nodes/TM/ nl carotid upstrokes bilaterally   LUNGS: no acc muscle use,  Nl contour chest which is clear to A and P bilaterally without cough on insp or exp maneuvers   CV:  RRR  no s3 or murmur or increase in P2, and no edema   ABD:  soft and nontender with nl inspiratory excursion in the supine position. No bruits or organomegaly appreciated, bowel sounds nl  MS:  Nl gait/ ext warm without deformities, calf tenderness, cyanosis or clubbing No obvious joint restrictions   SKIN: warm and dry without lesions    NEURO:  alert, approp, nl sensorium with  no motor or cerebellar deficits apparent.        CXR PA and Lateral:   09/17/2018 :    I personally reviewed images and agree with radiology impression as follows:    The findings are worrisome for progressive sarcoidosis although superimposed acute pneumonia is not excluded. No definite findings worrisome for metastatic disease.    Labs ordered/ reviewed:    Chemistry       Component Value Date/Time   NA 136 09/17/2018 1156   K 3.9 09/17/2018 1156   CL 99 09/17/2018 1156   CO2 29 09/17/2018 1156   BUN 14 09/17/2018 1156   CREATININE 0.76 09/17/2018 1156      Component Value Date/Time   CALCIUM 9.5 09/17/2018 1156   ALKPHOS 117 06/19/2018 1127   AST 22 06/19/2018 1127   ALT 27 06/19/2018 1127   BILITOT 0.5 06/19/2018 1127        Lab Results  Component Value Date   WBC 7.7 09/17/2018   HGB 13.0 09/17/2018   HCT 37.4 09/17/2018   MCV  91.5 09/17/2018   PLT 228.0 09/17/2018        EOS                                                             0.4                                     09/17/2018        Lab Results  Component Value Date   TSH 3.42 09/17/2018     Lab Results  Component Value Date   PROBNP 51.0 09/17/2018       Lab Results  Component Value Date   ESRSEDRATE 74 (H) 09/17/2018   ESRSEDRATE 87 (H) 05/31/2014   ESRSEDRATE 69 (H) 10/01/2011                 Assessment & Plan:

## 2018-09-17 NOTE — Patient Instructions (Addendum)
No change in prednisone dose for now - whatever you are taking is ok for now (late add see below)  Only use your albuterol (PROAIR)as a rescue medication to be used if you can't catch your breath by resting or doing a relaxed purse lip breathing pattern.  - The less you use it, the better it will work when you need it. - Ok to use up to 2 puffs  every 4 hours if you must but call for immediate appointment if use goes up over your usual need - Don't leave home without it !!  (think of it like the spare tire for your car)   Also ok to take albuterol (PROAIR) 5 min before you exert to see if helps    Work on inhaler technique:  relax and gently blow all the way out then take a nice smooth deep breath back in, triggering the inhaler at same time you start breathing in.  Hold for up to 5 seconds if you can.  .       Please remember to go to the lab and x-ray department downstairs in the basement  for your tests - we will call you with the results when they are available.     Please schedule a follow up office visit in 4 weeks, sooner if needed  with all active medications /inhalers/ solutions in hand so we can verify exactly what you are taking. This includes all medications from all doctors and over the counters - if you don't have all of them at your next visit we wmay need to re-schedule your visit  - add:  Increase prednisone to 20 mg daily until better then 10 mg daily until return

## 2018-09-18 ENCOUNTER — Other Ambulatory Visit: Payer: Self-pay | Admitting: Internal Medicine

## 2018-09-18 DIAGNOSIS — R600 Localized edema: Secondary | ICD-10-CM

## 2018-09-18 DIAGNOSIS — I10 Essential (primary) hypertension: Secondary | ICD-10-CM

## 2018-09-18 MED ORDER — HYDROCHLOROTHIAZIDE 25 MG PO TABS: 25.0000 mg | ORAL_TABLET | Freq: Every morning | ORAL | 1 refills | Status: DC

## 2018-09-18 MED ORDER — BISOPROLOL FUMARATE 10 MG PO TABS: 10.0000 mg | ORAL_TABLET | Freq: Every day | ORAL | 1 refills | Status: DC

## 2018-09-18 MED ORDER — AMLODIPINE BESYLATE 10 MG PO TABS: 10.0000 mg | ORAL_TABLET | Freq: Every day | ORAL | 1 refills | Status: DC

## 2018-09-18 NOTE — Progress Notes (Signed)
Spoke with pt and notified of results per Dr. Wert. Pt verbalized understanding and denied any questions. 

## 2018-09-20 ENCOUNTER — Encounter: Payer: Self-pay | Admitting: Internal Medicine

## 2018-09-20 NOTE — Assessment & Plan Note (Addendum)
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  - Chronic pred rx 2/09> 04/2008  - Restart Pred December 26, 2008 > June 12, 2009 stopped  > restarted 07/11/11>>pt stopped 10/15/11  - PFT's March 23, 2010 VC 1.77 (66%) no obst, ERV 34%, DLCO 53 > 115% corrected - PFT's 09/30/12       VC 2.02 (57%) ratio 78 , erv 13%  DLCO 55 > 118% corrected - Skin bx 05/19/14 bridge of nose > Pos NCG - 05/02/2015  Walked RA x 3 laps @ 185 ft each stopped due to end of study, no sob/ no desat at nl pace/ no cough provoked  - PFT's  08/22/2016 VC  1.81 ratio 82  with DLCO  59/55 % corrects to 88 % for alv volume  - Spirometry 05/07/2017   FVC  1.81  With no obst by fev1/fvc and no significant curvature  - 05/07/2017  After extensive coaching HFA effectiveness =    75% from a baseline of < 25%  - 05/22/2017   Walked RA x one lap @ 185 stopped due to  Sob at fast pace with sats 96% - 09/09/17 placed on floor dose of 5 mg daily and flared late Oct 2018 when ran out of rx  - Spirometry 06/16/2018  FVC  1.82 (62%) s obst  - 06/16/2018   Walked RA x one lap @ 185 stopped due to  Sob, mod fast pace, no desats   - 09/17/2018   Walked RA x one lap @ 185 stopped due to  Sob/ no desat on ? Dose prednisone with ESR 74 so rec pred 20 ub then 10 mg until seen    cxr and hx of worse sob on lower doses of pred c/w sarcoid flare  The goal with a chronic steroid dependent illness is always arriving at the lowest effective dose that controls the disease/symptoms and not accepting a set "formula" which is based on statistics or guidelines that don't always take into account patient  variability or the natural hx of the dz in every individual patient, which may well vary over time.  For now therefore I recommend the patient maintain  20 mg until better then 10 mg per day until seen in 4 weeks      I had an extended discussion with the patient reviewing all relevant studies completed to date and  lasting 15 to 20 minutes of a 25 minute visit    See device teaching which  extended face to face time for this visit.  Each maintenance medication was reviewed in detail including emphasizing most importantly the difference between maintenance and prns and under what circumstances the prns are to be triggered using an action plan format that is not reflected in the computer generated alphabetically organized AVS which I have not found useful in most complex patients, especially with respiratory illnesses  Please see AVS for specific instructions unique to this visit that I personally wrote and verbalized to the the pt in detail and then reviewed with pt  by my nurse highlighting any  changes in therapy recommended at today's visit to their plan of care.

## 2018-09-20 NOTE — Assessment & Plan Note (Addendum)
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 2) If the pt is convinced, as many are, that saba helps recover from activity faster then it's easy to tell if this is the case by re-challenging : ie stop, take the inhaler, then p 5 minutes try the exact same activity (intensity of workload) that just caused the symptoms and see if they are substantially diminished or not after saba 3) if there is an activity that reproducibly causes the symptoms, try the saba 15 min before the activity on alternate days   If in fact the saba really does help, then fine to continue to use it prn but advised may need to look closer at the maintenance regimen being used to achieve better control of airways disease with exertion.

## 2018-09-21 ENCOUNTER — Encounter: Payer: Self-pay | Admitting: Radiation Oncology

## 2018-09-21 LAB — ANGIOTENSIN CONVERTING ENZYME: Angiotensin-Converting Enzyme: 50 U/L (ref 9–67)

## 2018-09-21 NOTE — Progress Notes (Signed)
Spoke with pt and notified of results per Dr. Wert. Pt verbalized understanding and denied any questions. 

## 2018-09-21 NOTE — Progress Notes (Signed)
  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  No significant urinary or vaginal symptoms during treatment  Plan: The patient has completed radiation treatment. The patient will return to radiation oncology clinic for routine followup in one month. I advised them to call or return sooner if they have any questions or concerns related to their recovery or treatment.  -----------------------------------  Blair Promise, PhD, MD  This document serves as a record of services personally performed by Gery Pray, MD. It was created on his behalf by Wilburn Mylar, a trained medical scribe. The creation of this record is based on the scribe's personal observations and the provider's statements to them. This document has been checked and approved by the attending provider.

## 2018-09-23 ENCOUNTER — Ambulatory Visit: Payer: PRIVATE HEALTH INSURANCE | Admitting: Obstetrics

## 2018-09-28 ENCOUNTER — Telehealth: Payer: Self-pay | Admitting: *Deleted

## 2018-09-28 NOTE — Telephone Encounter (Signed)
On 09-28-18 fax medical records to [facility]

## 2018-09-28 NOTE — Telephone Encounter (Signed)
On 09-28-18 fax medical records to [employer]

## 2018-09-30 ENCOUNTER — Encounter: Payer: Self-pay | Admitting: Radiation Oncology

## 2018-10-19 ENCOUNTER — Ambulatory Visit: Payer: PRIVATE HEALTH INSURANCE | Admitting: Internal Medicine

## 2018-10-19 ENCOUNTER — Ambulatory Visit: Payer: PRIVATE HEALTH INSURANCE | Admitting: Radiation Oncology

## 2018-10-22 ENCOUNTER — Telehealth: Payer: Self-pay | Admitting: *Deleted

## 2018-10-22 ENCOUNTER — Ambulatory Visit
Admission: RE | Admit: 2018-10-22 | Discharge: 2018-10-22 | Disposition: A | Discharge status: Home / Self Care | Payer: PRIVATE HEALTH INSURANCE | Source: Ambulatory Visit | Attending: Radiation Oncology | Admitting: Radiation Oncology

## 2018-10-22 ENCOUNTER — Other Ambulatory Visit: Payer: Self-pay

## 2018-10-22 ENCOUNTER — Encounter: Payer: Self-pay | Admitting: Radiation Oncology

## 2018-10-22 VITALS — BP 190/89 | HR 83 | Temp 98.3°F | Resp 20 | Ht 65.5 in | Wt 229.8 lb

## 2018-10-22 DIAGNOSIS — Z79899 Other long term (current) drug therapy: Secondary | ICD-10-CM | POA: Insufficient documentation

## 2018-10-22 DIAGNOSIS — C541 Malignant neoplasm of endometrium: Secondary | ICD-10-CM | POA: Insufficient documentation

## 2018-10-22 DIAGNOSIS — Z880 Allergy status to penicillin: Secondary | ICD-10-CM | POA: Diagnosis not present

## 2018-10-22 DIAGNOSIS — Z923 Personal history of irradiation: Secondary | ICD-10-CM | POA: Diagnosis not present

## 2018-10-22 LAB — URINALYSIS, COMPLETE (UACMP) WITH MICROSCOPIC
Bilirubin Urine: NEGATIVE
Glucose, UA: NEGATIVE mg/dL
Ketones, ur: NEGATIVE mg/dL
Nitrite: NEGATIVE
Protein, ur: 30 mg/dL — AB
SPECIFIC GRAVITY, URINE: 1.019 (ref 1.005–1.030)
pH: 5 (ref 5.0–8.0)

## 2018-10-22 NOTE — Progress Notes (Signed)
Pt presents today for f/u with Dr. Sondra Come. 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 Pt reports intense urge with minimal urinary output. Pt reports stress incontinence continues. Pt denies dysuria/hematuria. Pt reports slight vaginal bleeding, "brownish". Pt denies odor, itching, or vaginal discharge. Pt denies rectal bleeding, constipation. Pt reports diarrhea. Pt was educated on Imodium OTC for diarrhea.   Vaginal dilator teaching done with good understanding.   BP (!) 190/89 (BP Location: Right Arm, Patient Position: Sitting)   Pulse 83   Temp 98.3 F (36.8 C) (Oral)   Resp 20   Ht 5' 5.5" (1.664 m)   Wt 229 lb 12.8 oz (104.2 kg)   LMP 12/17/2003   SpO2 100%   BMI 37.66 kg/m   Wt Readings from Last 3 Encounters:  10/22/18 229 lb 12.8 oz (104.2 kg)  09/17/18 227 lb 12.8 oz (103.3 kg)  08/19/18 227 lb (103 kg)   [name], RN BSN

## 2018-10-22 NOTE — Progress Notes (Signed)
Radiation Oncology         [phone] ________________________________  Name: [name] MRN: [id number]  Date: 10/22/2018  DOB: August 05, 1956  Follow-Up Visit Note  CC: [name], [name], MD  [name], [name], MD    ICD-10-CM   1. 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 She is currently on oral prednisone for this.   She reports intense urge with minimal urinary output, continued stress incontinence with sneezing, coughing, and heavy lifting, slight vaginal bleeding with a "brownish" color, and diarrhea. She denies dysuria or hematuria, vaginal odor, itching, or discharge, rectal bleeding, and constipation.                              ALLERGIES:  is allergic to shellfish allergy and penicillins.  Meds: Current Outpatient Medications  Medication Sig Dispense Refill  . albuterol (PROVENTIL HFA;VENTOLIN HFA) 108 (90 Base) MCG/ACT inhaler Inhale 2 puffs into the lungs every 4 (four) hours as needed for wheezing or shortness of breath. 1 Inhaler 5  . amLODipine (NORVASC) 10 MG tablet Take 1 tablet (10 mg total) by mouth daily. 90 tablet 1  . bisoprolol (ZEBETA) 10 MG tablet Take 1 tablet (10 mg total) by mouth daily. 90 tablet 1  . Dextromethorphan-Menthol (DELSYM COUGH RELIEF MT) Take 5 mLs by mouth daily as needed (cough).     . famotidine (PEPCID) 20 MG tablet Take 1 tablet (20 mg total) by  mouth at bedtime. 30 tablet 11  . hydrochlorothiazide (HYDRODIURIL) 25 MG tablet Take 1 tablet (25 mg total) by mouth every morning. 90 tablet 1  . loratadine (CLARITIN) 10 MG tablet Take 10 mg by mouth daily as needed for allergies.    Marland Kitchen oxyCODONE (OXY IR/ROXICODONE) 5 MG immediate release tablet Take 1 tablet (5 mg total) by mouth every 4 (four) hours as needed for severe pain. 30 tablet 0  . pantoprazole (PROTONIX) 40 MG tablet Take 40 mg by mouth daily.    . phenylephrine (SUDAFED PE) 10 MG TABS tablet Take 10 mg by mouth every 4 (four) hours as needed (allergies).     Vladimir Faster Glycol-Propyl Glycol (SYSTANE) 0.4-0.3 % GEL ophthalmic gel Place 1 application into both eyes as needed.    . prednisoLONE acetate (PRED FORTE) 1 % ophthalmic suspension Place 1 drop into both eyes as needed.    . predniSONE (DELTASONE) 1 MG tablet Take 1 mg by mouth daily with breakfast.    . simvastatin (ZOCOR) 20 MG tablet Take 20 mg by mouth daily.    . traMADol (ULTRAM) 50 MG tablet 1-2 every 4 hours as needed for cough or pain (Patient taking differently: Take 50 mg by mouth every 4 (four) hours as needed for moderate pain (cough). ) 40 tablet 0  . Triamcinolone Acetonide (TRIAMCINOLONE 0.1 % CREAM : EUCERIN) CREA Apply 1 application topically daily as  needed for rash or itching. (Patient taking differently: Apply 1 application topically daily. ) 500 each 0   No current facility-administered medications for this encounter.     Physical Findings: The patient is in no acute distress. Patient is alert and oriented.  height is 5' 5.5" (1.664 m) and weight is 229 lb 12.8 oz (104.2 kg). Her oral temperature is 98.3 F (36.8 C). Her blood pressure is 190/89 (abnormal) and her pulse is 83. Her respiration is 20 and oxygen saturation is 100%.  Lungs are clear to auscultation bilaterally. Heart has regular rate and rhythm. No palpable cervical, supraclavicular, or axillary adenopathy. Abdomen soft, non-tender, normal  bowel sounds. Pelvic exam deferred in light of recent treatment completion.   Lab Findings: Lab Results  Component Value Date   WBC 7.7 09/17/2018   HGB 13.0 09/17/2018   HCT 37.4 09/17/2018   MCV 91.5 09/17/2018   PLT 228.0 09/17/2018    Radiographic Findings: No results found.  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 Vaginal dilator and education given with good understanding. She is having some urinary incontinence, and we will send her to the lab today to ensure this is not a urinary tract infection. We discussed pelvic floor rehab if she continues to have these issues.  Plan:  Follow up in radiation oncology in 5 months. Patient will follow up with Dr. Denman George in 2 months.  -----------------------------------  Blair Promise, PhD, MD   This document serves as a record of services personally performed by Gery Pray, MD. It was created on his behalf by Wilburn Mylar, a trained medical scribe. The creation of this record is based on the scribe's personal observations and the provider's statements to them. This document has been checked and approved by the attending provider.

## 2018-10-22 NOTE — Progress Notes (Signed)
  Home Care Instructions for the Insertion and Care of Your Vaginal Dilator  Why Do I Need a Vaginal Dilator?  Internal radiation therapy may cause scar tissue to form at the top of your vagina (vaginal cuff).  This may make vaginal examinations difficult in the future. You can prevent scar tissue from forming by using a vaginal dilator (a smooth plastic rod), and/or by having regular sexual intercourse.  If not using the dilator you should be having intercourse two or three times a week.  If you are unable to have intercourse, you should use your vaginal dilator.  You may have some spotting or bleeding from your dilator or intercourse the first few times. You may also have some discomfort. If discomfort occurs with intercourse, you and your partner may need to stop for a while and try again later.  How to Use Your Vaginal Dilator  - Wash the dilator with soap and water before and after each use. - Check the dilator to be sure it is smooth. Do not use the dilator if you find any roughspots. - Coat the dilator with K-Y Jelly, Astroglide, or Replens. Do not use Vaseline, baby oil, or other oil based lubricants. They are not water-soluble and can be irritating to the tissues in the vagina. - Lie on your back with your knees bent and legs apart. - Insert the rounded end of the dilator into your vagina as far as it will go without causing pain or discomfort. - Close your knees and slowly straighten your legs. - Keep the dilator in your vagina for about 10 to 15 minutes.  Please use 3 times a week, for example: Monday, Wednesday and Friday evenings. - Bend your knees, open your legs, and gently remove the dilator. - Gently cleanse the skin around the vaginal opening. - Wash the dilator after each use. -  It is important that you use the dilator routinely until instructed otherwise by your doctor.   Jill W Pfefferkorn, RN BSN   

## 2018-10-22 NOTE — Telephone Encounter (Signed)
Returned Frederika call and scheduled follow up appt for the patient

## 2018-10-22 NOTE — Telephone Encounter (Signed)
Called patient to inform of fu appt. With Dr. Denman George on 01-18-18 - arrival time- 1 p.m., spoke with patient and she is aware of this appt.

## 2018-10-23 ENCOUNTER — Encounter: Payer: Self-pay | Admitting: Internal Medicine

## 2018-10-23 ENCOUNTER — Ambulatory Visit (INDEPENDENT_AMBULATORY_CARE_PROVIDER_SITE_OTHER): Payer: PRIVATE HEALTH INSURANCE | Admitting: Internal Medicine

## 2018-10-23 DIAGNOSIS — D869 Sarcoidosis, unspecified: Secondary | ICD-10-CM

## 2018-10-23 DIAGNOSIS — R0609 Other forms of dyspnea: Secondary | ICD-10-CM | POA: Diagnosis not present

## 2018-10-23 DIAGNOSIS — I10 Essential (primary) hypertension: Secondary | ICD-10-CM | POA: Diagnosis not present

## 2018-10-23 LAB — URINE CULTURE: Culture: 40000 — AB

## 2018-10-23 NOTE — Patient Instructions (Addendum)
Work on inhaler technique:  relax and gently blow all the way out then take a nice smooth deep breath back in, triggering the inhaler at same time you start breathing in.  Hold for up to 5 seconds if you can. . Rinse and gargle with water when done  Try proair x 2 pffs x 15 min before the Y on  alternating days to see if there is a difference  exercising after the albuterol or not.    Please schedule a follow up office visit in 6 weeks, call sooner if needed with PFTs on return

## 2018-10-23 NOTE — Progress Notes (Signed)
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  Last chronic prednisone 04/15/08 with no flare cough no nodules (right leg and left hand and right arm)   History of Present Illness  Last ov 02/17/13 no evidence active sarcoid rec Aciphex 20 mg Take 30-60 min before first meal of the day also when coughing for any reason take Pepcid 20 mg one at bedtime  Take delsym two tsp every 12 hours and supplement if needed with  tramadol 50 mg up to 2 every 4 hours to suppress the urge to cough. Swallowing water or using ice chips/non mint and menthol containing candies (such as lifesavers or sugarless jolly ranchers) are also effective.  You should rest your voice and avoid activities that you know make you cough. Once you have eliminated the cough for 3 straight days try reducing the tramadol first,  then the delsym as tolerated.   GERD diet    01/28/2014  Pulmonary consultation / Wert/ on no maint resp rx / rare baseline need for saba  Chief Complaint  Patient presents with  . Pulmonary Consult    Referred per Dr. Posey Pronto. [name] Cough is prod with moderate yellow sputum. She is using proair approx tid.  Onset was Jan 01 2014 while on aciphex with bfast with flu like illness but not follow contingency rx  On prednisone taper/ sp tamiflu/cough meds  Not limited by breathing from desired activities   rec Aciphex 20 mg Take 30-60 min before first meal of the day also when coughing for any reason take  Pepcid 20 mg one at bedtime and chlortrimeton 4 mg at bedtime Take delsym two tsp every 12 hours and supplement if needed with  tramadol 50 mg up to 2 every 4 hours to suppress the urge to cough.   Once you have eliminated the cough for 3 straight days try reducing the tramadol first,  then the delsym as tolerated.   GERD diet         05/07/2017 acute extended ov/Wert re: cough > sob  Chief Complaint  Patient presents with  . Acute Visit    Cough not improving, non prod "feels like something in my throat". She is waking up in the night coughing.   was doing fine for months since last ov with me but not doing well as well since 02/18/17 when placed on lisinopril and gradually  downhill since on multple courses of prednisone and now symbicort (though very poor hfa) and much worse dry cough 24/7 no longer has med calendar and doesn't even recognize the copy we have / very confused with meds  rec Stop lotensin and start valasartan 325 mg one daily  Start amlodipine 10 mg daily  Work on inhaler technique:  albuterol as a rescue medication to be used if you can't catch your breath For cough use the flutter valve and Take delsym two tsp every 12 hours and supplement if needed with  tramadol 50 mg up to 2 every 4 hours to suppress  the urge to cough   Finish your prednisone Follow the med calendar daily and bring it back with you to all office visits      05/22/2017  f/u ov/Wert re: cough > sob off acei x 2 weeks some better  Chief Complaint  Patient presents with  . Follow-up    Here to go over medication   doe = MMRC2 = can't walk a nl pace on a flat grade s sob but does fine slow and flat   Cough mostly daytime / dry/ never took tramadol correctly rec Take delsym two tsp every 12 hours and supplement if needed with  tramadol 50 mg up to 2 every 4 hours to suppress the urge to cough  Prednisone 10 mg x 2  Each am x 3 days then 1 daily x 3 days and stop.      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 Follow-up    Cough and SOB are some better, but not back to her normal baseline. Her cough is non prod. She also notices some wheezing.   rarely if ever uses saba at hs/ mostly daytime need of over doe it with exertion / no following recs for gerd/ no longer on ppi or h2 hs - much less need for saba than prior to stopping acei  Did not see [name] for med calendar as req / did not take am meds yet  rec Pantoprazole (protonix) 40 mg   Take  30-60 min before first meal of the day and Pepcid (famotidine)  20 mg one @  bedtime until return to office - this is the best way to tell whether stomach acid is contributing to your problem.   GERD diet   09/09/17 NP ov rec Continue on Prednisone 20mg  daily for 2 weeks then 10mg  daily for 2 weeks then 5mg  daily .   surgery 09/23/17 [name] / Anus:  - ANGIOKERATOMA.  10/21/2017  f/u ov/Wert re:  Sarcoid/ steroid dep / ocular involvmemnt  Chief Complaint  Patient presents with  . Follow-up    Increased cough x 2 wks- non prod. Her breathing is unchanged. She ran out of prednisone approx 2 wks ago and never had it refilled.   confused re details of care  ? Flare of ocular sarcoid off pred > Bowing otc eyedrops for wetting and no further pred eyedrops Breathing worse = doe since cough since about a week or two p stopped pred though instructions say to taper to 5 mg  Not using med calendar/ not updated since 2016  rec Prednisone 5 mg daily      06/16/2018  f/u ov/Wert re: sarcoid preop clearance/ still on pred 5 mg daily - needs TAH/bso Chief Complaint  Patient presents with  . Surgical clearance    She is having a complete hysterectomy and need clearance. Her breathing is okay SOB  with activity. No cough at this time.  Dyspnea:  Across a parking lot = MMRC3 = can't walk 100 yards even at a slow pace at a flat grade s stopping due to sob  Cough:  none SABA use: once in a blue moon 02: no  rec We will refill your bisoprolol and amlodipine for 3 months but after than you should let your PCP take over Please schedule a follow up visit in 3 months but call sooner if needed  July 11 th TAH BSO went fine    09/17/2018  Extended post hosp f/u ov/Wert re: sarcoidosis / sob - not sure how much pred she takes  Chief Complaint  Patient presents with  . Follow-up    Increased SOB [place]. She states she gets SOB walking short distances such as from our parking lot to the building today. She has had to use her recue inhaler a few times recently. She also c/o dry mouth.   Dyspnea:  MMRC3 = can't walk 100 yards even at a slow pace at a flat grade s stopping due to sob  - worse last 4 months thinks has been  on prednisone 5 mg  daily the whole time  Still [employer] pushing the cart around  Cough: no Sleeping: on side electric bed maybe 10 degrees  SABA use: says breathing better p saba bid  02: none  rec No change in prednisone dose for now - whatever you are taking is ok for now (late add see below) Only use your albuterol (PROAIR)as a rescue medication Also ok to take albuterol (PROAIR) 5 min before you exert to see if helps  Please schedule a follow up office visit in 4 weeks, sooner if needed  with all active medications /inhalers/ solutions in hand so we can verify exactly what you are taking. This includes all medications from all doctors and over the counters - if you don't have all of them at your next visit we wmay need to re-schedule your visit  - add:  Increase prednisone to 20 mg daily until better then 10 mg daily until return     10/23/2018  f/u ov/Wert re:  Sob ? Sarcoid > no better on pred 20 so back to 5 mg  Not using saba as listed - the one she had was plugged up with 10 missing but says using 4 pff pe rday   Chief Complaint  Patient presents with  . Follow-up    Breathing is slightly  improved. She is using her albuterol inhaler 2 x daily on average.   Dyspnea: MMRC3 = can't walk 100 yards even at a slow pace at a flat grade s stopping due to sob   Cough: no  Sleeping: 30 degrees hob no problems  SABA use:   02: none    No obvious day to day or daytime variability or assoc excess/ purulent sputum or mucus plugs or hemoptysis or cp or chest tightness, subjective wheeze or overt sinus or hb symptoms.   Sleeping as above  without nocturnal  or early am exacerbation  of respiratory  c/o's or need for noct saba. Also denies any obvious fluctuation of symptoms with weather or environmental changes or other aggravating or alleviating factors except as outlined above   No unusual exposure hx or h/o childhood pna/ asthma or knowledge of premature birth.  Current Allergies, Complete Past Medical History, Past Surgical History, Family History, and Social History were reviewed in [employer] record.  ROS  The following are not active complaints unless bolded Hoarseness, sore throat, dysphagia, dental problems, itching, sneezing,  nasal congestion or discharge of excess mucus or purulent secretions, ear ache,   fever, chills, sweats, unintended wt loss or wt gain, classically pleuritic or exertional cp,  orthopnea pnd or arm/hand swelling  or leg swelling, presyncope, palpitations, abdominal pain, anorexia, nausea, vomiting, diarrhea  or change in bowel habits or change in bladder  habits, change in stools or change in urine, dysuria, hematuria,  rash, arthralgias, visual complaints, headache, numbness, weakness or ataxia or problems with walking or coordination,  change in mood or  memory.        Current Meds  Medication Sig  . albuterol (PROVENTIL HFA;VENTOLIN HFA) 108 (90 Base) MCG/ACT inhaler Inhale 2 puffs into the lungs every 4 (four) hours as needed for wheezing or shortness of breath.  Marland Kitchen amLODipine (NORVASC) 10 MG tablet Take 1 tablet (10 mg total) by mouth  daily.  . bisoprolol (ZEBETA) 10 MG tablet Take 1 tablet (10 mg total) by mouth daily.  Marland Kitchen dextromethorphan (DELSYM) 30 MG/5ML liquid Take by mouth as needed for cough.  . famotidine (PEPCID) 20 MG tablet Take 1 tablet (20 mg total) by mouth at bedtime.  . hydrochlorothiazide (HYDRODIURIL) 25 MG tablet Take 1 tablet (25 mg total) by mouth every morning.  . loratadine (CLARITIN) 10 MG tablet Take 10 mg by mouth daily as needed for allergies.  Marland Kitchen oxyCODONE (OXY IR/ROXICODONE) 5 MG immediate release tablet Take 1 tablet (5 mg total) by mouth every 4 (four) hours as needed for severe pain.  . phenylephrine (SUDAFED PE) 10 MG TABS tablet Take 10 mg by mouth every 4 (four) hours as needed (allergies).   Vladimir Faster Glycol-Propyl Glycol (SYSTANE) 0.4-0.3 % GEL ophthalmic gel Place 1 application into both eyes as needed.  . prednisoLONE 5 MG TABS tablet Take 5 mg by mouth daily.  . prednisoLONE acetate (PRED FORTE) 1 % ophthalmic suspension Place 1 drop into both eyes as needed.  . simvastatin (ZOCOR) 20 MG tablet Take 20 mg by mouth daily.  . traMADol (ULTRAM) 50 MG tablet 1-2 every 4 hours as needed for cough or pain (Patient taking differently: Take 50 mg by mouth every 4 (four) hours as needed for moderate pain (cough). )  . Triamcinolone Acetonide (TRIAMCINOLONE 0.1 % CREAM : EUCERIN) CREA Apply 1 application topically daily as needed for rash or itching. (Patient taking differently: Apply 1 application topically daily. )                             Objective:   Physical Exam    Obese bf nad   10/23/2018      229  09/17/2018      227  06/16/2018        225  10/21/2017      224  07/07/2017      223  05/22/2017         219  05/07/2017       222  08/22/2016         216   05/17/2016        215   05/02/15 219 lb (99.338 kg)  04/07/15 219 lb 9.6 oz (99.61 kg)  03/24/15 216 lb (97.977 kg)        Vital signs reviewed - Note on arrival 02 sats  95% on RA and bp 168/92 and pulse 65        HEENT: nl dentition, turbinates bilaterally, and oropharynx. Nl external ear canals without cough reflex   NECK :  without JVD/Nodes/TM/ nl carotid upstrokes bilaterally   LUNGS: no acc muscle use,  Nl contour chest with minimal crackles on insp  bilaterally without cough on insp or exp maneuvers   CV:  RRR  no s3 or murmur or increase in P2, and no edema   ABD:  Obese/ soft and nontender with nl inspiratory excursion in the supine position. No bruits or organomegaly appreciated, bowel sounds nl  MS:  Nl gait/ ext warm without deformities, calf tenderness, cyanosis or clubbing No obvious joint restrictions   SKIN: warm and dry without lesions    NEURO:  alert, approp, nl sensorium with  no motor or cerebellar deficits apparent.                Assessment & Plan:

## 2018-10-25 ENCOUNTER — Encounter: Payer: Self-pay | Admitting: Internal Medicine

## 2018-10-25 NOTE — Assessment & Plan Note (Addendum)
Worse x 06/2018 pelvic surgery  - 09/17/2018   Walked RA x one lap @ 185 stopped due to  Sob/ no desat/ moderate pace - Spirometry 09/17/2018  FEV1 1.0 (49%)  Ratio 66 s prior  But curve is typical of VCD not asthma  - 10/23/2018  After extensive coaching inhaler device,  effectiveness =    75%    Continues to struggle with med reconciliation issues and unexplained sob which is likely more related to obesity/ conditioning than parenchymal or airway involvement with sarcoid (see separate a/p)  >>> no change rx/ follow up in 6 weeks with pfts

## 2018-10-25 NOTE — Assessment & Plan Note (Addendum)
She is adequately beta blocked but still not optimally controlled on present regimen. I reviewed this with the patient and emphasized importance of follow-up with primary care as well as wt loss/ avoid salt    I had an extended discussion with the patient reviewing all relevant studies completed to date and  lasting 15 to 20 minutes of a 25 minute visit    See device teaching which extended face to face time for this visit.  Each maintenance medication was reviewed in detail including emphasizing most importantly the difference between maintenance and prns and under what circumstances the prns are to be triggered using an action plan format that is not reflected in the computer generated alphabetically organized AVS which I have not found useful in most complex patients, especially with respiratory illnesses  Please see AVS for specific instructions unique to this visit that I personally wrote and verbalized to the the pt in detail and then reviewed with pt  by my nurse highlighting any  changes in therapy recommended at today's visit to their plan of care.

## 2018-10-25 NOTE — Assessment & Plan Note (Addendum)
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  - Chronic pred rx 2/09> 04/2008  - Restart Pred December 26, 2008 > June 12, 2009 stopped  > restarted 07/11/11>>pt stopped 10/15/11  - PFT's March 23, 2010 VC 1.77 (66%) no obst, ERV 34%, DLCO 53 > 115% corrected - PFT's 09/30/12       VC 2.02 (57%) ratio 78 , erv 13%  DLCO 55 > 118% corrected - Skin bx 05/19/14 bridge of nose > Pos NCG - 05/02/2015  Walked RA x 3 laps @ 185 ft each stopped due to end of study, no sob/ no desat at nl pace/ no cough provoked  - PFT's  08/22/2016 VC  1.81 ratio 82  with DLCO  59/55 % corrects to 88 % for alv volume  - Spirometry 05/07/2017   FVC  1.81  With no obst by fev1/fvc and no significant curvature  - 05/07/2017  After extensive coaching HFA effectiveness =    75% from a baseline of < 25%  - 05/22/2017   Walked RA x one lap @ 185 stopped due to  Sob at fast pace with sats 96% - 09/09/17 placed on floor dose of 5 mg daily and flared late Oct 2018 when ran out of rx  - Spirometry 06/16/2018  FVC  1.82 (62%) s obst  - 06/16/2018   Walked RA x one lap @ 185 stopped due to  Sob, mod fast pace, no desats  - 09/17/2018   Walked RA x one lap @ 185 stopped due to  Sob/ no desat on ? Dose prednisone with ESR 74 so rec pred 20 ub then 10 mg until seen   The goal with a chronic steroid dependent illness is always arriving at the lowest effective dose that controls the disease/symptoms and not accepting a set "formula" which is based on statistics or guidelines that don't always take into account patient  variability or the natural hx of the dz in every individual patient, which may well vary over time.  For now therefore I recommend the patient maintain  Minimum dosing   >>>>  No better on pred 20 mg daily so will continue the 5 mg "floor"

## 2018-12-14 NOTE — Progress Notes (Signed)
@Patient  ID: [name], female    DOB: 11-24-1956, 62 y.o.   MRN: [id number]  Chief Complaint  Patient presents with  . 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  Smoker/ Smoking History: Former smoker Maintenance:  Prednisone 71m  Pt of: Dr. WMelvyn Novas  Recent [facility] Encounters:   10/23/2018-office visit-[facility] >>>mMRC 3.   SLaurence Ferrari2 times daily Plan: 5 mg daily prednisone follow-up in 6 weeks with PFTs  12/15/2018  - Visit   62year old female patient presenting today.  Patient was unable to complete pulmonary function test that she is had increased nasal congestion, cough as well as mucus production.  The symptoms started about 10 to 14 days ago.  Patient [place] surrounded by a lot of people who are sick and coughing a lot.  Patient reports that symptoms have not improved and she continues to have purulent cough with yellow sputum.  Patient does admit that over the course of the last 1 to 2 weeks she has increased her prednisone to 10 mg daily and she does feel that this is helped some.  Patient reports severe nasal congestion unable to blow out of her nose as well as discolored mucus.  Patient reports increased sinus pain and tenderness.  She reports the symptoms have been going on for the last 10 days.   Patient reports she uses her rescue inhaler about once a week.   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 Dose prednisone with ESR 74 so rec pred 20 ub then 10 mg until seen   FENO:  Lab Results  Component Value Date   NITRICOXIDE 10 08/22/2016    PFT: PFT Results Latest Ref Rng & Units 08/22/2016  FVC-Pre L 1.70  FVC-Predicted Pre % 61  FVC-Post L 1.77  FVC-Predicted Post % 63  Pre FEV1/FVC % % 80  Post FEV1/FCV % % 82  FEV1-Pre L 1.37  FEV1-Predicted Pre % 62  FEV1-Post L 1.45  DLCO UNC% % 59  DLCO COR %Predicted % 88  TLC L 3.28  TLC % Predicted % 63  RV % Predicted % 74    Imaging: No results found.    Specialty Problems      Pulmonary Problems   Upper airway cough syndrome    Followed in Pulmonary clinic/ [facility]/ [facility]  - added flutter 05/02/2015 >>> - flare  May 2017 p stopped aciphex > rec restart 05/17/2016  - flare since 02/2017 placed on lisinopril with no airflow obst 05/07/2017 by spirometry > d/c ACEi  Permanently  - improved 07/07/2017 but not on gerd rx > rec resume full gerd rx/ stay off acei         Bifid uvula   Wheezing   Cough   DOE (dyspnea on exertion)    Worse x 06/2018 pelvic surgery  - 09/17/2018   Walked RA x one lap @ 185 stopped due to  Sob/ no desat/ moderate pace - Spirometry 09/17/2018  FEV1 1.0 (49%)  Ratio 66 s prior  But 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 Shellfish Allergy Hives, Itching and Swelling  . Penicillins Hives and Itching    Has patient had a PCN reaction causing immediate rash, facial/tongue/throat swelling, SOB or lightheadedness with hypotension: Yes Has patient had a PCN reaction causing severe rash involving mucus membranes or skin necrosis: Yes Has patient had a PCN reaction that required hospitalization: Yes Has patient had a PCN reaction occurring within the last 10 years: No If all of the above answers are "NO", then may proceed with Cephalosporin use.     Immunization History  Administered Date(s) Administered  . Influenza Split 10/01/2011, 09/23/2012  . Influenza Whole 09/15/2010  . Tdap 07/06/2013    Past Medical History:  Diagnosis Date  . Bifid uvula   . GERD (gastroesophageal reflux disease)   . History of cervical dysplasia   . History of chemotherapy    06/ 2005  . History of cleft lip    CORRECTED  . History of condyloma acuminatum    PERIANAL  . History of radiation therapy    completed 06/ 2005  . 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 History of vulvar dysplasia   . Hypertension   . 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 Pulmonary nodule, left   . 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 Sarcoid uveitis of left eye goes to [facility]--  currently no issues per pt 11-27-2016  . Upper airway cough syndrome   . Wears glasses     Tobacco History: Social History   Tobacco Use  Smoking Status Former Smoker  . Packs/day: 0.50  . Years: 1.00  . Pack years: 0.50  . Types: Cigarettes  . Last attempt to quit: 12/16/1993  . Years since quitting: 25.0  Smokeless Tobacco Never Used   Counseling given: Not  Answered  Continue to not smoke  Outpatient Encounter Medications as of 12/15/2018  Medication Sig  . albuterol (PROVENTIL HFA;VENTOLIN HFA) 108 (90 Base) MCG/ACT inhaler Inhale 2 puffs into the lungs every 4 (four) hours as needed for wheezing or shortness of breath.  Marland Kitchen amLODipine (NORVASC) 10 MG tablet Take 1 tablet (10 mg total) by mouth daily.  . bisoprolol (ZEBETA) 10 MG tablet Take 1 tablet (10 mg total) by mouth daily.  Marland Kitchen dextromethorphan (DELSYM) 30 MG/5ML liquid Take by mouth as needed for cough.  . famotidine (PEPCID) 20 MG tablet Take 1 tablet (20 mg total) by mouth at bedtime.  . hydrochlorothiazide (HYDRODIURIL)  25 MG tablet Take 1 tablet (25 mg total) by mouth every morning.  . loratadine (CLARITIN) 10 MG tablet Take 10 mg by mouth daily as needed for allergies.  Marland Kitchen oxyCODONE (OXY IR/ROXICODONE) 5 MG immediate release tablet Take 1 tablet (5 mg total) by mouth every 4 (four) hours as needed for severe pain.  . phenylephrine (SUDAFED PE) 10 MG TABS tablet Take 10 mg by mouth every 4 (four) hours as needed (allergies).   Vladimir Faster Glycol-Propyl Glycol (SYSTANE) 0.4-0.3 % GEL ophthalmic gel Place 1 application into both eyes as needed.  . prednisoLONE 5 MG TABS tablet Take 5 mg by mouth daily.  . prednisoLONE acetate (PRED FORTE) 1 % ophthalmic suspension Place 1 drop into both eyes as needed.  . simvastatin (ZOCOR) 20 MG tablet Take 20 mg by mouth daily.  . traMADol (ULTRAM) 50 MG tablet 1-2 every 4 hours as needed for cough or pain (Patient taking differently: Take 50 mg by mouth every 4 (four) hours as needed for moderate pain (cough). )  . Triamcinolone Acetonide (TRIAMCINOLONE 0.1 % CREAM : EUCERIN) CREA Apply 1 application topically daily as needed for rash or itching. (Patient taking differently: Apply 1 application topically daily. )  . doxycycline (VIBRA-TABS) 100 MG tablet Take 1 tablet (100 mg total) by mouth 2 (two) times daily.  . predniSONE (DELTASONE) 10 MG tablet 4  tabs for 2 days, then 3 tabs for 2 days, 2 tabs for 2 days, then 1 tab for 2 days, then stop   No facility-administered encounter medications on file as of 12/15/2018.      Review of Systems  Review of Systems  Constitutional: Positive for chills and fatigue. Negative for fever and unexpected weight change.  HENT: Positive for congestion, sinus pressure and voice change (increased hoarseness). Negative for postnasal drip and sinus pain.   Respiratory: Positive for cough (productive yellow mucous ) and shortness of breath. Negative for chest tightness and wheezing.   Cardiovascular: Negative for chest pain and palpitations.  Gastrointestinal: Negative for blood in stool, diarrhea, nausea and vomiting.  Musculoskeletal: Negative for arthralgias.  Skin: Negative for color change.  Allergic/Immunologic: Negative for environmental allergies and food allergies.  Neurological: Positive for headaches. Negative for dizziness and light-headedness.  Psychiatric/Behavioral: Negative for dysphoric mood. The patient is not nervous/anxious.   All other systems reviewed and are negative.    Physical Exam  BP (!) 168/102 (BP Location: Left Arm, Cuff Size: Normal)   Pulse 68   Temp 98 F (36.7 C) (Oral)   Ht 5' 5.5" (1.664 m)   Wt 227 lb (103 kg)   LMP 12/17/2003   SpO2 93%   BMI 37.20 kg/m   Wt Readings from Last 5 Encounters:  12/15/18 227 lb (103 kg)  10/23/18 229 lb 12.8 oz (104.2 kg)  10/22/18 229 lb 12.8 oz (104.2 kg)  09/17/18 227 lb 12.8 oz (103.3 kg)  08/19/18 227 lb (103 kg)    Physical Exam  Constitutional: She is oriented to person, place, and time and well-developed, well-nourished, and in no distress. No distress.  HENT:  Head: Normocephalic and atraumatic.  Right Ear: Hearing, external ear and ear canal normal.  Left Ear: Hearing, external ear and ear canal normal.  Nose: Mucosal edema and rhinorrhea present. Right sinus exhibits maxillary sinus tenderness. Right sinus  exhibits no frontal sinus tenderness. Left sinus exhibits maxillary sinus tenderness. Left sinus exhibits no frontal sinus tenderness.  Mouth/Throat: Uvula is midline and oropharynx is clear and  moist. No oropharyngeal exudate.  +Postnasal drip  Eyes: Pupils are equal, round, and reactive to light.  Neck: Normal range of motion. Neck supple. No JVD present.  Cardiovascular: Normal rate, regular rhythm and normal heart sounds.  Pulmonary/Chest: Effort normal and breath sounds normal. No accessory muscle usage. No respiratory distress. She has no decreased breath sounds. She has no wheezes. She has no rhonchi.  Abdominal: Soft. Bowel sounds are normal. There is no abdominal tenderness.  Musculoskeletal: Normal range of motion.        General: No edema.  Lymphadenopathy:    She has no cervical adenopathy.  Neurological: She is alert and oriented to person, place, and time. Gait normal.  Skin: Skin is warm and dry. She is not diaphoretic. No erythema.  Psychiatric: Mood, memory, affect and judgment normal.  Nursing note and vitals reviewed.     Lab Results:  CBC    Component Value Date/Time   WBC 7.7 09/17/2018 1156   RBC 4.09 09/17/2018 1156   HGB 13.0 09/17/2018 1156   HGB 12.4 04/22/2008 1317   HCT 37.4 09/17/2018 1156   HCT 34.2 (L) 04/22/2008 1317   PLT 228.0 09/17/2018 1156   PLT 216 04/22/2008 1317   MCV 91.5 09/17/2018 1156   MCV 90.9 04/22/2008 1317   MCH 32.2 06/26/2018 0426   MCHC 34.7 09/17/2018 1156   RDW 14.4 09/17/2018 1156   RDW 14.0 04/22/2008 1317   LYMPHSABS 1.4 09/17/2018 1156   LYMPHSABS 0.8 (L) 04/22/2008 1317   MONOABS 0.8 09/17/2018 1156   MONOABS 0.5 04/22/2008 1317   EOSABS 0.4 09/17/2018 1156   EOSABS 0.2 04/22/2008 1317   BASOSABS 0.0 09/17/2018 1156   BASOSABS 0.0 04/22/2008 1317    BMET    Component Value Date/Time   NA 136 09/17/2018 1156   K 3.9 09/17/2018 1156   CL 99 09/17/2018 1156   CO2 29 09/17/2018 1156   GLUCOSE 118 (H)  09/17/2018 1156   BUN 14 09/17/2018 1156   CREATININE 0.76 09/17/2018 1156   CALCIUM 9.5 09/17/2018 1156   GFRNONAA >60 06/26/2018 0426   GFRAA >60 06/26/2018 0426    BNP No results found for: BNP  ProBNP    Component Value Date/Time   PROBNP 51.0 09/17/2018 1156      Assessment & Plan:   62 year old female patient completing acute visit with our office today.  Will treat patient has acute sinusitis with nasal congestion as well as sinus tenderness.  Patient will need to complete pulmonary function test in 4 to 6 weeks with follow-up office visit with Dr. Melvyn Novas.  Sinusitis, acute maxillary Doxycycline >>> 1 100 mg tablet every 12 hours for 7 days >>>take with food  >>>wear sunscreen   Prednisone 42m tablet  >>>4 tabs for 2 days, then 3 tabs for 2 days, 2 tabs for 2 days, then 1 tab for 2 days, then resume 572mdaily dose  >>>take with food  >>>take in the morning   Will provide sample of nasal saline rinse please do this twice a day during course of nasal congestion and illness >>> Sample provided today, can purchase more over-the-counter  Follow-up in 4 to 6 weeks with pulmonary function testing and Dr. WeMelvyn NovasSarcoidosis ([facility] Prednisone 1057mablet  >>>4 tabs for 2 days, then 3 tabs for 2 days, 2 tabs for 2 days, then 1 tab for 2 days, then resume 5mg51mily dose  >>>take with food  >>>take in the morning   Follow-up  in 4 to 6 weeks with pulmonary function testing and Dr. Melvyn Novas  Upper airway cough syndrome Doxycycline >>> 1 100 mg tablet every 12 hours for 7 days >>>take with food  >>>wear sunscreen   Prednisone 78m tablet  >>>4 tabs for 2 days, then 3 tabs for 2 days, 2 tabs for 2 days, then 1 tab for 2 days, then resume 539mdaily dose  >>>take with food  >>>take in the morning   Will provide sample of nasal saline rinse please do this twice a day during course of nasal congestion and illness >>> Sample provided today, can purchase more  over-the-counter  Please start taking a daily antihistamine:  >>>choose one of: zyrtec, claritin, allegra, or xyzal  >>>these are over the counter medications  >>>can choose generic option  >>>take daily  >>>this medication helps with allergies, post nasal drip, and cough   Follow-up in 4 to 6 weeks with pulmonary function testing and Dr. WeUvaldo RisingNP 12/15/2018   This appointment was 30 min long with over 50% of the time in direct face-to-face patient care, assessment, plan of care, and follow-up.

## 2018-12-15 ENCOUNTER — Ambulatory Visit: Payer: PRIVATE HEALTH INSURANCE

## 2018-12-15 ENCOUNTER — Encounter: Payer: Self-pay | Admitting: Pulmonary Disease

## 2018-12-15 ENCOUNTER — Ambulatory Visit (INDEPENDENT_AMBULATORY_CARE_PROVIDER_SITE_OTHER): Payer: PRIVATE HEALTH INSURANCE | Admitting: Pulmonary Disease

## 2018-12-15 VITALS — BP 168/102 | HR 68 | Temp 98.0°F | Ht 65.5 in | Wt 227.0 lb

## 2018-12-15 DIAGNOSIS — J0100 Acute maxillary sinusitis, unspecified: Secondary | ICD-10-CM | POA: Diagnosis not present

## 2018-12-15 DIAGNOSIS — R058 Other specified cough: Secondary | ICD-10-CM

## 2018-12-15 DIAGNOSIS — D869 Sarcoidosis, unspecified: Secondary | ICD-10-CM

## 2018-12-15 DIAGNOSIS — R05 Cough: Secondary | ICD-10-CM | POA: Diagnosis not present

## 2018-12-15 MED ORDER — PREDNISONE 10 MG PO TABS: ORAL_TABLET | ORAL | 0 refills | Status: DC

## 2018-12-15 MED ORDER — DOXYCYCLINE HYCLATE 100 MG PO TABS: 100.0000 mg | ORAL_TABLET | Freq: Two times a day (BID) | ORAL | 0 refills | Status: DC

## 2018-12-15 NOTE — Patient Instructions (Addendum)
Doxycycline >>> 1 100 mg tablet every 12 hours for 7 days >>>take with food  >>>wear sunscreen   Prednisone 10mg  tablet  >>>4 tabs for 2 days, then 3 tabs for 2 days, 2 tabs for 2 days, then 1 tab for 2 days, then resume 5mg  daily dose  >>>take with food  >>>take in the morning   Will provide sample of nasal saline rinse please do this twice a day during course of nasal congestion and illness >>> Sample provided today, can purchase more over-the-counter  Please start taking a daily antihistamine:  >>>choose one of: zyrtec, claritin, allegra, or xyzal  >>>these are over the counter medications  >>>can choose generic option  >>>take daily  >>>this medication helps with allergies, post nasal drip, and cough   Follow-up in 4 to 6 weeks with pulmonary function testing and Dr. Melvyn Novas  It is flu season:   >>>Remember to be washing your hands regularly, using hand sanitizer, be careful to use around herself with has contact with people who are sick will increase her chances of getting sick yourself. >>> Best ways to protect herself from the flu: Receive the yearly flu vaccine, practice good hand hygiene washing with soap and also using hand sanitizer when available, eat a nutritious meals, get adequate rest, hydrate appropriately   Please contact the office if your symptoms worsen or you have concerns that you are not improving.   Thank you for choosing [facility] for your healthcare, and for allowing [place] to partner with you on your healthcare journey. I am thankful to be able to provide care to you today.   Wyn Quaker FNP-C

## 2018-12-15 NOTE — Assessment & Plan Note (Signed)
  Prednisone 10mg  tablet  >>>4 tabs for 2 days, then 3 tabs for 2 days, 2 tabs for 2 days, then 1 tab for 2 days, then resume 5mg  daily dose  >>>take with food  >>>take in the morning   Follow-up in 4 to 6 weeks with pulmonary function testing and Dr. Melvyn Novas

## 2018-12-15 NOTE — Assessment & Plan Note (Signed)
Doxycycline >>> 1 100 mg tablet every 12 hours for 7 days >>>take with food  >>>wear sunscreen   Prednisone 10mg  tablet  >>>4 tabs for 2 days, then 3 tabs for 2 days, 2 tabs for 2 days, then 1 tab for 2 days, then resume 5mg  daily dose  >>>take with food  >>>take in the morning   Will provide sample of nasal saline rinse please do this twice a day during course of nasal congestion and illness >>> Sample provided today, can purchase more over-the-counter  Follow-up in 4 to 6 weeks with pulmonary function testing and Dr. Melvyn Novas

## 2018-12-15 NOTE — Assessment & Plan Note (Signed)
Doxycycline >>> 1 100 mg tablet every 12 hours for 7 days >>>take with food  >>>wear sunscreen   Prednisone 10mg  tablet  >>>4 tabs for 2 days, then 3 tabs for 2 days, 2 tabs for 2 days, then 1 tab for 2 days, then resume 5mg  daily dose  >>>take with food  >>>take in the morning   Will provide sample of nasal saline rinse please do this twice a day during course of nasal congestion and illness >>> Sample provided today, can purchase more over-the-counter  Please start taking a daily antihistamine:  >>>choose one of: zyrtec, claritin, allegra, or xyzal  >>>these are over the counter medications  >>>can choose generic option  >>>take daily  >>>this medication helps with allergies, post nasal drip, and cough   Follow-up in 4 to 6 weeks with pulmonary function testing and Dr. Melvyn Novas

## 2018-12-24 NOTE — Progress Notes (Signed)
Chart and office note reviewed in detail  > agree with a/p as outlined    

## 2019-01-14 ENCOUNTER — Ambulatory Visit (INDEPENDENT_AMBULATORY_CARE_PROVIDER_SITE_OTHER): Payer: PRIVATE HEALTH INSURANCE | Admitting: Internal Medicine

## 2019-01-14 ENCOUNTER — Encounter: Payer: Self-pay | Admitting: Internal Medicine

## 2019-01-14 VITALS — BP 146/82 | HR 79 | Ht 64.0 in | Wt 229.0 lb

## 2019-01-14 DIAGNOSIS — R0609 Other forms of dyspnea: Secondary | ICD-10-CM

## 2019-01-14 DIAGNOSIS — D869 Sarcoidosis, unspecified: Secondary | ICD-10-CM

## 2019-01-14 DIAGNOSIS — R058 Other specified cough: Secondary | ICD-10-CM

## 2019-01-14 DIAGNOSIS — R05 Cough: Secondary | ICD-10-CM

## 2019-01-14 LAB — PULMONARY FUNCTION TEST
DL/VA % PRED: 86 %
DL/VA: 4.15 ml/min/mmHg/L
DLCO unc % pred: 57 %
DLCO unc: 13.87 ml/min/mmHg
FEF 25-75 POST: 1.89 L/s
FEF 25-75 Pre: 1.26 L/sec
FEF2575-%CHANGE-POST: 49 %
FEF2575-%PRED-POST: 91 %
FEF2575-%Pred-Pre: 60 %
FEV1-%CHANGE-POST: 10 %
FEV1-%PRED-PRE: 61 %
FEV1-%Pred-Post: 68 %
FEV1-PRE: 1.29 L
FEV1-Post: 1.43 L
FEV1FVC-%CHANGE-POST: -1 %
FEV1FVC-%PRED-PRE: 101 %
FEV6-%Change-Post: 11 %
FEV6-%Pred-Post: 68 %
FEV6-%Pred-Pre: 61 %
FEV6-POST: 1.77 L
FEV6-Pre: 1.59 L
FEV6FVC-%Change-Post: 0 %
FEV6FVC-%Pred-Post: 103 %
FEV6FVC-%Pred-Pre: 103 %
FVC-%Change-Post: 12 %
FVC-%PRED-POST: 67 %
FVC-%PRED-PRE: 59 %
FVC-POST: 1.79 L
FVC-PRE: 1.59 L
POST FEV1/FVC RATIO: 80 %
PRE FEV6/FVC RATIO: 100 %
Post FEV6/FVC ratio: 100 %
Pre FEV1/FVC ratio: 81 %
RV % pred: 77 %
RV: 1.53 L
TLC % pred: 67 %
TLC: 3.42 L

## 2019-01-14 MED ORDER — MOMETASONE FURO-FORMOTEROL FUM 100-5 MCG/ACT IN AERO: 2.0000 | INHALATION_SPRAY | Freq: Two times a day (BID) | RESPIRATORY_TRACT | 0 refills | Status: DC

## 2019-01-14 NOTE — Patient Instructions (Signed)
Start dulera 100 Take 2 puffs first thing in am and then another 2 puffs about 12 hours later.   Work on inhaler technique:  relax and gently blow all the way out then take a nice smooth deep breath back in, triggering the inhaler at same time you start breathing in.  Hold for up to 5 seconds if you can. Blow out thru nose. Rinse and gargle with water when done  Only use your albuterol (proair) as a rescue medication to be used if you can't catch your breath by resting or doing a relaxed purse lip breathing pattern.  - The less you use it, the better it will work when you need it. - Ok to use up to 2 puffs  every 4 hours if you must but call for immediate appointment if use goes up over your usual need - Don't leave home without it !!  (think of it like the spare tire for your car)    Please schedule a follow up office visit in 2  weeks, sooner if needed  with all medications /inhalers/ solutions in hand so we can verify exactly what you are taking. This includes all medications from all doctors and over the counters

## 2019-01-14 NOTE — Progress Notes (Signed)
Results were discussed with patient by Dr. Melvyn Novas in office 01/14/2019.  Wyn Quaker, FNP

## 2019-01-14 NOTE — Progress Notes (Signed)
PFT done today. 

## 2019-01-14 NOTE — Progress Notes (Signed)
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      History of Present Illness  Last ov 02/17/13 no evidence active sarcoid rec Aciphex 20 mg Take 30-60 min before first meal of the day also when coughing for any reason take Pepcid 20 mg one at bedtime  Take delsym two tsp every 12 hours and supplement if needed with  tramadol 50 mg up to 2 every 4 hours to suppress the urge to cough. Swallowing water or using ice chips/non mint and menthol containing candies (such as lifesavers or sugarless jolly ranchers) are also effective.  You should rest your voice and avoid activities that you know make you cough. Once you have eliminated the cough for 3 straight days try reducing the tramadol first,  then the delsym as tolerated.   GERD diet    01/28/2014  Pulmonary consultation / Veronica [name]/ on no maint resp rx / rare baseline need for saba  Chief Complaint  Patient presents with  . Pulmonary Consult    Referred per Dr. Posey Pronto. [name] Cough is prod with moderate yellow sputum. She is using proair approx tid.  Onset was Jan 01 2014 while on aciphex with bfast with flu like illness but not follow contingency rx  On prednisone taper/ sp tamiflu/cough meds  Not limited by breathing from desired activities   rec Aciphex 20 mg Take 30-60 min before first meal of the day also when coughing for any reason take Pepcid 20 mg one at bedtime and chlortrimeton 4 mg at bedtime Take delsym two tsp every 12 hours and  supplement if needed with  tramadol 50 mg up to 2 every 4 hours to suppress the urge to cough.   Once you have eliminated the cough for 3 straight days try reducing the tramadol first,  then the delsym as tolerated.   GERD diet         05/07/2017 acute extended ov/Veronica [name] re: cough > sob  Chief Complaint  Patient presents with  . Acute Visit    Cough not improving, non prod "feels like something in my throat". She is waking up in the night coughing.   was doing fine for months since last ov with me but not doing well as well since 02/18/17 when placed on lisinopril and gradually  downhill since on multple courses of prednisone and now symbicort (though very poor hfa) and much worse dry cough 24/7 no longer has med calendar and doesn't even recognize the copy we have / very confused with meds  rec Stop lotensin and start valasartan 325 mg one daily  Start amlodipine 10 mg daily  Work on inhaler technique:  albuterol as a rescue medication to be used if you can't catch your breath For cough use the flutter valve and Take delsym two tsp every 12 hours and supplement if needed with  tramadol 50 mg up to 2 every 4 hours to suppress the urge to cough   Finish your prednisone Follow the med calendar daily and bring  it back with you to all office visits      05/22/2017  f/u ov/Veronica [name] re: cough > sob off acei x 2 weeks some better  Chief Complaint  Patient presents with  . Follow-up    Here to go over medication   doe = MMRC2 = can't walk a nl pace on a flat grade s sob but does fine slow and flat   Cough mostly daytime / dry/ never took tramadol correctly rec Take delsym two tsp every 12 hours and supplement if needed with  tramadol 50 mg up to 2 every 4 hours to suppress the urge to cough  Prednisone 10 mg x 2  Each am x 3 days then 1 daily x 3 days and stop.      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 Follow-up    Cough and SOB are some better, but not back to her normal baseline. Her cough is non prod. She also notices some wheezing.   rarely if ever uses saba at hs/ mostly daytime need of over doe it with exertion / no following recs for gerd/ no longer on ppi or h2 hs - much less need for saba than prior to stopping acei  Did not see Veronica [name] for med calendar as req / did not take am meds yet  rec Pantoprazole (protonix) 40 mg   Take  30-60 min before first meal of the day and Pepcid (famotidine)  20 mg one @  bedtime until return to office - this is the best way to tell whether stomach acid is contributing to your problem.   GERD diet     09/09/17 [name] ov rec Continue on Prednisone 20mg  daily for 2 weeks then 10mg  daily for 2 weeks then 5mg  daily .   surgery 09/23/17 Veronica [name] / Anus:  - ANGIOKERATOMA.  10/21/2017  f/u ov/Veronica [name] re:  Sarcoid/ steroid dep / ocular involvmemnt  Chief Complaint  Patient presents with  . Follow-up    Increased cough x 2 wks- non prod. Her breathing is unchanged. She ran out of prednisone approx 2 wks ago and never had it refilled.   confused re details of care  ? Flare of ocular sarcoid off pred >  otc eyedrops for wetting and no further pred eyedrops Breathing worse = doe since cough since about a week or two p stopped pred though instructions say to taper to 5 mg  Not using med calendar/ not updated since 2016  rec Prednisone 5 mg daily      06/16/2018  f/u ov/Veronica [name] re: sarcoid preop clearance/ still on pred 5 mg daily - needs TAH/bso Chief Complaint  Patient presents with  . Surgical clearance    She is having a complete hysterectomy and need clearance. Her breathing is okay SOB  with activity. No cough at this time.  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 Follow-up    Increased SOB [place]. She states she gets SOB walking short distances such as from our parking lot to the building today. She has had to use her recue inhaler a few times recently. She also c/o dry mouth.   Dyspnea:  MMRC3 = can't walk 100 yards even at a slow pace at a flat grade s stopping due to sob  - worse last 4 months thinks has been  on prednisone 5 mg  daily the whole time  Still [employer] pushing the cart around  Cough: no Sleeping: on side electric bed maybe 10 degrees  SABA use: says breathing better p saba bid  02: none  rec No change in prednisone dose for now - whatever you are taking is ok for now (late add see below) Only use your albuterol (PROAIR)as a rescue medication Also ok to take albuterol (PROAIR) 5 min before you exert to see if helps  Please schedule a follow up office visit in 4 weeks, sooner if needed  with all active medications /inhalers/ solutions in hand so we can verify exactly what you are taking. This includes all medications from all doctors and over the counters - if you don't have all of them at your next visit we wmay need to re-schedule your visit  - add:  Increase prednisone to 20 mg daily until better then 10 mg daily until return     10/23/2018  f/u ov/Veronica [name] re:  Sob ? Sarcoid > no better on pred 20 so back to 5 mg  Not using saba as listed - the one she had was plugged up with 10 missing but says using 4 pff pe rday   Chief Complaint  Patient presents with  . Follow-up    Breathing is slightly improved. She is using her albuterol inhaler 2 x  daily on average.   Dyspnea: MMRC3 = can't walk 100 yards even at a slow pace at a flat grade s stopping due to sob   Cough: no  Sleeping: 30 degrees hob no problems  SABA use:   02: none  rec Work on inhaler technique:    Try proair x 2 pffs x 15 min before the Y on  alternating days to see if there is a difference  exercising after the albuterol or not.     01/14/2019  f/u ov/Veronica [name] re: ? Sarcoid on pred 5 mg daily  Chief Complaint  Patient presents with  . Follow-up    PFT's done today. Her breathing is some better since the last visit. She states she has good days and bad days. She is using her albuterol inhaler 3 x daily on average.   Dyspnea:  On days when use Proair,  Does gxt ? Doesn't  Know speed/ slt tilted x 3 min use when not using inhaler/ walks on track x 5 min slow pace Cough: none Sleeping: 30 degrees hob SABA use:  Usually not needing at night but this am she felt needed saba  prior to pfts 02: no   No obvious day to day or daytime variability or assoc excess/ purulent sputum or mucus plugs or hemoptysis or cp or chest tightness, subjective wheeze or overt sinus or hb symptoms.  Sleeping a 30 degrees without nocturnal  or early am exacerbation  of respiratory  c/o's or need for noct saba. Also denies any obvious fluctuation of symptoms with weather or environmental changes or other aggravating or alleviating factors except as outlined above   No unusual exposure hx or h/o childhood pna/ asthma or knowledge of premature birth.  Current Allergies, Complete Past Medical History, Past Surgical History, Family History, and Social History were reviewed in [employer] record.  ROS  The following are not active complaints unless bolded Hoarseness, sore throat, dysphagia, dental problems, itching, sneezing,  nasal congestion or discharge of excess mucus or purulent secretions, ear ache,   fever, chills, sweats, unintended wt loss or wt gain, classically  pleuritic or exertional cp,  orthopnea pnd or arm/hand swelling  or leg swelling, presyncope, palpitations, abdominal pain, anorexia, nausea, vomiting, diarrhea  or change in bowel habits or change in bladder habits, change in stools or change in urine, dysuria, hematuria,  rash, arthralgias, visual complaints, headache, numbness, weakness or ataxia or problems with walking or coordination,  change in mood or  memory.        Current Meds - NOTE:   Unable to verify as accurately reflecting what pt takes - brought bottles years old "I reuse them"  - pharmacy records req 01/14/2019 support refills only on bisoprolol and amlodipine plus pred, hctz    Medication Sig  . albuterol (PROVENTIL HFA;VENTOLIN HFA) 108 (90 Base) MCG/ACT inhaler Inhale 2 puffs into the lungs every 4 (four) hours as needed for wheezing or shortness of breath.  Marland Kitchen amLODipine (NORVASC) 10 MG tablet Take 1 tablet (10 mg total) by mouth daily.  . bisoprolol (ZEBETA) 10 MG tablet Take 1 tablet (10 mg total) by mouth daily.  Marland Kitchen dextromethorphan (DELSYM) 30 MG/5ML liquid Take by mouth as needed for cough.  . famotidine (PEPCID) 20 MG tablet Take 1 tablet (20 mg total) by mouth at bedtime.  . hydrochlorothiazide (HYDRODIURIL) 25 MG tablet Take 1 tablet (25 mg total) by mouth every morning.  . loratadine (CLARITIN) 10 MG tablet Take 10 mg by mouth daily as needed for allergies.  . phenylephrine (SUDAFED PE) 10 MG TABS tablet Take 10 mg by mouth every 4 (four) hours as needed (allergies).   . predniSONE (DELTASONE) 10 MG tablet Take 5 mg by mouth daily with breakfast.  . RABEprazole (ACIPHEX) 20 MG tablet Take 20 mg by mouth daily before breakfast.  . simvastatin (ZOCOR) 20 MG tablet Take 20 mg by mouth daily.  . traMADol (ULTRAM) 50 MG tablet 1-2 every 4 hours as needed for cough or pain (Patient taking differently: Take 50 mg by mouth every 4 (four) hours as needed for moderate pain (cough). )  . Triamcinolone Acetonide (TRIAMCINOLONE 0.1 %  CREAM : EUCERIN) CREA Apply 1 application topically daily as needed for rash or itching. (Patient taking differently: Apply 1 application topically daily. )                Objective:   Physical Exam    Obese bf nad   01/14/2019      229  10/23/2018      229  09/17/2018      227  06/16/2018        225  10/21/2017      224  07/07/2017      223  05/22/2017         219  05/07/2017       222  08/22/2016         216   05/17/2016        215   05/02/15 219 lb (99.338 kg)  04/07/15 219 lb 9.6 oz (99.61 kg)  03/24/15 216 lb (97.977 kg)    Vital signs reviewed - Note on arrival 02 sats  93% on RA          HEENT: nl dentition, turbinates bilaterally, and oropharynx. Nl external ear canals without cough reflex   NECK :  without JVD/Nodes/TM/ nl carotid upstrokes bilaterally   LUNGS: no acc muscle use,  Nl contour chest which is clear to A and P bilaterally without cough on insp or exp maneuvers   CV:  RRR  no s3 or murmur or increase in P2, and no edema   ABD:  soft and nontender with nl inspiratory excursion in the supine position. No bruits or organomegaly appreciated, bowel sounds nl  MS:  Nl gait/ ext warm without deformities, calf tenderness, cyanosis or clubbing No obvious joint restrictions   SKIN: warm and dry without lesions    NEURO:  alert, approp, nl sensorium with  no motor or cerebellar deficits apparent.                 Assessment & Plan:

## 2019-01-17 ENCOUNTER — Encounter: Payer: Self-pay | Admitting: Internal Medicine

## 2019-01-17 NOTE — Assessment & Plan Note (Signed)
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 not asthma - 09/17/2018  After extensive coaching inhaler device,  effectiveness =    75%  - 01/14/2019   Walked RA x one lap =  approx 250 ft - stopped due to sob no desats, slow pace  - PFT's  01/14/2019  FEV1 1.43  (68 % ) ratio 0.80  p 10 % improvement from saba p saba prior to study with DLCO  57 % corrects to 86 % for alv volume  ERV 35 on pred 5 mg  daily  - virtually identical to study done 08/22/16 @ wt =  216 vs 229 01/14/2019   Very little evidence to suggest progressive or active sarcoid involving airways or lung parenchyma with progressive wt gain/ deconditioning the main issue here so need to try to minimize prednisone/ consolidate rx by accurate and meaningful med reconciliation.

## 2019-01-17 NOTE — Assessment & Plan Note (Signed)
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  - Chronic pred rx 2/09> 04/2008  - Restart Pred December 26, 2008 > June 12, 2009 stopped  > restarted 07/11/11>>pt stopped 10/15/11  - PFT's March 23, 2010 VC 1.77 (66%) no obst, ERV 34%, DLCO 53 > 115% corrected - PFT's 09/30/12       VC 2.02 (57%) ratio 78 , erv 13%  DLCO 55 > 118% corrected - Skin bx 05/19/14 bridge of nose > Pos NCG - 05/02/2015  Walked RA x 3 laps @ 185 ft each stopped due to end of study, no sob/ no desat at nl pace/ no cough provoked  - PFT's  08/22/2016 VC  1.81 ratio 82  with DLCO  59/55 % corrects to 88 % for alv volume  - Spirometry 05/07/2017   FVC  1.81  With no obst by fev1/fvc and no significant curvature  - 05/07/2017  After extensive coaching HFA effectiveness =    75% from a baseline of < 25%  - 05/22/2017   Walked RA x one lap @ 185 stopped due to  Sob at fast pace with sats 96% - 09/09/17 placed on floor dose of 5 mg daily and flared late Oct 2018 when ran out of rx  - Spirometry 06/16/2018  FVC  1.82 (62%) s obst  - 06/16/2018   Walked RA x one lap @ 185 stopped due to  Sob, mod fast pace, no desats  - 09/17/2018   Walked RA x one lap @ 185 stopped due to  Sob/ no desat on ? Dose prednisone with ESR 74 so rec pred 20 ub then taper to 5 mg    - 01/14/2019  After extensive coaching inhaler device,  effectiveness =    75% from a baseline of < 25 % so try dulera 100 2bid x 2 week sample then return in 2 weeks to regroup/ count use   Since insists the saba helps but can't see any significant airflow obst on pfts today  (albeit p saba use) reasonable to try dulera 100 2 bid       I had an extended discussion with the patient   reviewing all relevant studies completed to date and  lasting 15 to 20 minutes of a 25 minute visit  which included directly observing ambulatory 02 saturation study documented in a/p section of  today's  office note.  See device teaching which extended face to face time for this visit  Each maintenance medication was reviewed in  detail including most importantly the difference between maintenance and prns and under what circumstances the prns are to be triggered using an action plan format that is not reflected in the computer generated alphabetically organized AVS.     Please see AVS for specific instructions unique to this visit that I personally wrote and verbalized to the the pt in detail and then reviewed with pt  by my nurse highlighting any changes in therapy recommended at today's visit .    Marland Kitchen

## 2019-01-17 NOTE — Assessment & Plan Note (Addendum)
Body mass index is 39.31 kg/m.  -  trending further up on pred Lab Results  Component Value Date   TSH 3.42 09/17/2018     Contributing to gerd risk/ doe/reviewed the need and the process to achieve and maintain neg calorie balance > defer f/u primary care including intermittently monitoring thyroid status    >>> will try topical ics (dulera) to see if can taper pred now/ off in this setting.   Marland Kitchen

## 2019-01-18 ENCOUNTER — Inpatient Hospital Stay: Payer: PRIVATE HEALTH INSURANCE | Attending: Gynecologic Oncology | Admitting: Gynecologic Oncology

## 2019-01-18 ENCOUNTER — Encounter: Payer: Self-pay | Admitting: Gynecologic Oncology

## 2019-01-18 VITALS — BP 150/78 | HR 78 | Temp 97.8°F | Resp 18 | Ht 64.0 in | Wt 229.0 lb

## 2019-01-18 DIAGNOSIS — Z90710 Acquired absence of both cervix and uterus: Secondary | ICD-10-CM | POA: Insufficient documentation

## 2019-01-18 DIAGNOSIS — Z87891 Personal history of nicotine dependence: Secondary | ICD-10-CM | POA: Insufficient documentation

## 2019-01-18 DIAGNOSIS — D869 Sarcoidosis, unspecified: Secondary | ICD-10-CM | POA: Diagnosis not present

## 2019-01-18 DIAGNOSIS — Z90722 Acquired absence of ovaries, bilateral: Secondary | ICD-10-CM | POA: Insufficient documentation

## 2019-01-18 DIAGNOSIS — Z9221 Personal history of antineoplastic chemotherapy: Secondary | ICD-10-CM | POA: Diagnosis not present

## 2019-01-18 DIAGNOSIS — C541 Malignant neoplasm of endometrium: Secondary | ICD-10-CM | POA: Diagnosis present

## 2019-01-18 DIAGNOSIS — Z923 Personal history of irradiation: Secondary | ICD-10-CM | POA: Insufficient documentation

## 2019-01-18 NOTE — Patient Instructions (Addendum)
Please notify Dr Denman George at phone number [phone] if you notice vaginal bleeding, new pelvic or abdominal pains, bloating, feeling full easy, or a change in bladder or bowel function.   After you see him in April, please ask Dr Clabe Seal office to contact Dr Serita Grit office (at [phone])  to request an appointment with her for July, 2020 (her schedule will be available in April).  You can discuss with your primary doctor a plastic surgery evaluation for your buttock wound.

## 2019-01-18 NOTE — Progress Notes (Signed)
Follow-up Note: Gyn-Onc  Consult was requested by Dr. Vanessa Kick for the evaluation of [name] 63 y.o. female  CC:  Chief Complaint  Patient presents with  . Endometrial cancer [facility])    Assessment/Plan:  [name]  is a 63 y.o.  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 MSI stable, MMR normal. ER/PR negative. S/p adjuvant brachytherapy for high risk factors, completed October, 2019.   I recommend she follow-up at 3 monthly intervals for symptom review, physical examination and pelvic examination. Pap smear is not recommended in routine endometrial cancer surveillance. After 2 years we will space these visits to every 6 months, and then annually if recurrence has not developed within 5 years. All questions were answered.   HPI: [name] is a 63 year old parous woman who is seen in consultation at the request of Dr Harrington Challenger for endometrial cancer.   Patient began experiencing postmenopausal bleeding in May 2019.  Prior to that time she had been amenorrheic since receiving whole pelvic radiation in 2005 as neoadjuvant treatment with chemotherapy for locally advanced anal cancer.  Following radiation she had a good response and required a local resection at the anal canal but did not require an APR colostomy.  Since receiving that radiation treatment she has had ongoing toxicities from radiation including loose stools.  Her other significant medical histories include obesity.  She also has a significant medical history of sarcoidosis with particular impact upon her pulmonary function.  She takes daily prednisone 5 mg for this and additional inhaled agents.  She sees Dr. Melvyn Novas for pulmonology care.  She reports shortness of breath with ambulation for more than 20 m and shortness of breath upon stairclimbing.  She cannot comfortably lie flat due to shortness of breath.  She does not use oxygen therapy.  Her any prior abdominal surgical history is a  laparoscopic cholecystectomy.  She has however had 2 prior cesarean sections.  Interval Hx: On 06/25/18 she underwent robotic assisted total hysterectomy, BSO, SLN biopsy. Intraoperative findings were unremarkable. Final pathology revealed grade 1 endometrioid adenocarcinoma, 2.4cm, deep myometrial invasion, LVSI present. There was ITC's in bilateral SLN's. MMR normal, MSI stable. ER/PR negative. She was determined to have high risk factors for recurrence and adjuvant therapy was recommended in accordance to NCCN guidelines.  She has already received whole pelvic RT for her prior diagnosis of anal cancer in 2005.  Her case was reviewed by multidisciplinary conference, and prior radiation fields were reviewed. She was recommended to be considered for vaginal brachytherapy.   Between August 19, 2018 and September 17, 2018 she received brachii therapy, high-dose-rate, with iridium 192, to the vaginal cuff.  30 Gy was delivered in 5 fractions of 6 Gy.  She tolerated therapy well.  Current Meds:  Outpatient Encounter Medications as of 01/18/2019  Medication Sig  . albuterol (PROVENTIL HFA;VENTOLIN HFA) 108 (90 Base) MCG/ACT inhaler Inhale 2 puffs into the lungs every 4 (four) hours as needed for wheezing or shortness of breath.  Marland Kitchen amLODipine (NORVASC) 10 MG tablet Take 1 tablet (10 mg total) by mouth daily.  . bisoprolol (ZEBETA) 10 MG tablet Take 1 tablet (10 mg total) by mouth daily.  Marland Kitchen dextromethorphan (DELSYM) 30 MG/5ML liquid Take by mouth as needed for cough.   . famotidine (PEPCID) 20 MG tablet Take 1 tablet (20 mg total) by mouth at bedtime.  . hydrochlorothiazide (HYDRODIURIL) 25 MG tablet Take 1 tablet (25 mg total) by mouth every morning.  Marland Kitchen  loratadine (CLARITIN) 10 MG tablet Take 10 mg by mouth daily as needed for allergies.  . mometasone-formoterol (DULERA) 100-5 MCG/ACT AERO Inhale 2 puffs into the lungs 2 (two) times daily.  . predniSONE (DELTASONE) 10 MG tablet Take 5 mg by mouth daily  with breakfast.  . RABEprazole (ACIPHEX) 20 MG tablet Take 20 mg by mouth daily before breakfast.  . simvastatin (ZOCOR) 20 MG tablet Take 20 mg by mouth daily.  . traMADol (ULTRAM) 50 MG tablet 1-2 every 4 hours as needed for cough or pain (Patient taking differently: Take 50 mg by mouth every 4 (four) hours as needed for moderate pain (cough). )  . Triamcinolone Acetonide (TRIAMCINOLONE 0.1 % CREAM : EUCERIN) CREA Apply 1 application topically daily as needed for rash or itching. (Patient taking differently: Apply 1 application topically daily. )  . [DISCONTINUED] phenylephrine (SUDAFED PE) 10 MG TABS tablet Take 10 mg by mouth every 4 (four) hours as needed (allergies).    No facility-administered encounter medications on file as of 01/18/2019.     Allergy:  Allergies  Allergen Reactions  . Shellfish Allergy Hives, Itching and Swelling  . Penicillins Hives and Itching    Has patient had a PCN reaction causing immediate rash, facial/tongue/throat swelling, SOB or lightheadedness with hypotension: Yes Has patient had a PCN reaction causing severe rash involving mucus membranes or skin necrosis: Yes Has patient had a PCN reaction that required hospitalization: Yes Has patient had a PCN reaction occurring within the last 10 years: No If all of the above answers are "NO", then may proceed with Cephalosporin use.     Social Hx:   Social History   Socioeconomic History  . Marital status: Single    Spouse name: Not on file  . Number of children: Not on file  . Years of education: Not on file  . Highest education level: Not on file  Occupational History  . Occupation: Tech 1    Employer: [employer]  . Financial resource strain: Not on file  . Food insecurity:    Worry: Not on file    Inability: Not on file  . Transportation needs:    Medical: Not on file    Non-medical: Not on file  Tobacco Use  . Smoking status: Former Smoker    Packs/day: 0.50    Years: 1.00     Pack years: 0.50    Types: Cigarettes    Last attempt to quit: 12/16/1993    Years since quitting: 25.1  . Smokeless tobacco: Never Used  Substance and Sexual Activity  . Alcohol use: Yes    Comment: occasional  . Drug use: No  . Sexual activity: Never  Lifestyle  . Physical activity:    Days per week: Not on file    Minutes per session: Not on file  . Stress: Not on file  Relationships  . Social connections:    Talks on phone: Not on file    Gets together: Not on file    Attends religious service: Not on file    Active member of club or organization: Not on file    Attends meetings of clubs or organizations: Not on file    Relationship status: Not on file  . Intimate partner violence:    Fear of current or ex partner: Not on file    Emotionally abused: Not on file    Physically abused: Not on file    Forced sexual activity: Not on file  Other  Topics Concern  . Not on file  Social History Narrative  . Not on file    Past Surgical Hx:  Past Surgical History:  Procedure Laterality Date  . BRONCHOSCOPY  01/22/2008   w/ Lavage and bronchial bx  . CARDIOVASCULAR STRESS TEST  06/27/2006   normal nuclear study w/ no ischemia/  normal LV function and wall motion , ef 61%  . CARPAL TUNNEL RELEASE Right 01/17/2016   and finger trigger release  . CLEFT LIP REPAIR    . CO2 LASER ABLATION VULVA    . EUA/  EXCISIONAL MULTIPLE PERINEAL/ANAL BX'S/  SIGMOIDSCOPY/  PORT-A-CATH PLACEMENT  03/27/2004  . LAPAROSCOPIC CHOLECYSTECTOMY  08/14/2008  . MULTIPLE ANAL BX'S  10/10/2006  . RELEASE A1 PULLEY THUMB Bilateral left 12-17-2006/  right 09-05-2009  . REMOVAL PORT-A-CATH/  EUA/  MULTIPLE ANAL BX'S  10/16/2004  . ROBOTIC ASSISTED TOTAL HYSTERECTOMY WITH BILATERAL SALPINGO OOPHERECTOMY Bilateral 06/25/2018   Procedure: XI ROBOTIC ASSISTED TOTAL HYSTERECTOMY WITH BILATERAL SALPINGO OOPHORECTOMY;  Surgeon: Everitt Amber, MD;  Location: [facility];  Service: Gynecology;  Laterality: Bilateral;  .  SENTINEL NODE BIOPSY N/A 06/25/2018   Procedure: SENTINEL NODE BIOPSY;  Surgeon: Everitt Amber, MD;  Location: [facility];  Service: Gynecology;  Laterality: N/A;  . TENNIS ELBOW RELEASE/NIRSCHEL PROCEDURE Right 11/28/2016   Procedure: RIGHT ELBOW ULNER NERVE RELEASE AND OR TRANSPOSITION;  Surgeon: Iran Planas, MD;  Location: [place];  Service: Orthopedics;  Laterality: Right;    Past Medical Hx:  Past Medical History:  Diagnosis Date  . Bifid uvula   . GERD (gastroesophageal reflux disease)   . History of cervical dysplasia   . History of chemotherapy    06/ 2005  . History of cleft lip    CORRECTED  . History of condyloma acuminatum    PERIANAL  . History of radiation therapy    completed 06/ 2005  . 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 History of vulvar dysplasia   . Hypertension   . 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 Pulmonary nodule, left   . 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 Sarcoid uveitis of left eye goes to [facility]--  currently no issues per pt 11-27-2016  . Upper airway cough syndrome   . Wears glasses     Past Gynecological History:  Cervical dysplasia Patient's last menstrual period was 12/17/2003.  Family Hx:  Family History  Problem Relation Age of Onset  . Allergies Mother   . Asthma Mother   . Lung cancer Mother   . Allergies Sister   . Allergies Brother   . Asthma Brother   . Asthma Sister   . Sarcoidosis Neg Hx     Review of Systems:  Constitutional  Feels well,    ENT Normal appearing ears and nares bilaterally Skin/Breast  No rash, sores, jaundice, itching, dryness Cardiovascular  No chest pain or edema  Pulmonary  + shortness of breath  Gastro Intestinal  No nausea, vomitting.. No bright red blood per rectum, no abdominal pain, change in bowel movement, or  constipation. no diarrhea Genito Urinary  No frequency, urgency, dysuria, no spotting  Musculo Skeletal  No myalgia, arthralgia, joint swelling or pain  Neurologic  No weakness, numbness, change in gait,  Psychology  No depression, anxiety, insomnia.   Vitals:  Blood pressure Marland Kitchen)  150/78, pulse 78, temperature 97.8 F (36.6 C), temperature source Oral, resp. rate 18, height 5' 4"  (1.626 m), weight 229 lb (103.9 kg), last menstrual period 12/17/2003, SpO2 96 %.  Physical Exam: WD in NAD Neck  Supple NROM, without any enlargements.  Lymph Node Survey No cervical supraclavicular or inguinal adenopathy Cardiovascular  Pulse normal rate, regularity and rhythm. S1 and S2 normal.  Lungs  Clear to auscultation bilateraly, without wheezes/crackles/rhonchi. Good air movement.  Skin  Breakdown (dressed) on left buttock skin Psychiatry  Alert and oriented to person, place, and time  Abdomen  Normoactive bowel sounds, abdomen soft, non-tender and obese without evidence of hernia. Well healed incisions.  Back No CVA tenderness Genito Urinary  Vaginal cuff smooth, no lesions Rectal  Deferred, no visible anal lesions Extremities  No bilateral cyanosis, clubbing or edema.   Thereasa Solo, MD  01/18/2019, 2:07 PM

## 2019-01-19 ENCOUNTER — Telehealth: Payer: Self-pay | Admitting: Internal Medicine

## 2019-01-19 NOTE — Telephone Encounter (Signed)
Called and spoke with patient, she now has an appointment on 01/26/2019 that she will need her disability paperwork for. She will need to have this picked up by 01/25/2019 to take to the appointment. Patient would like to know status of this. Patrice please advise if you have an update on this, thank you.

## 2019-01-20 ENCOUNTER — Telehealth: Payer: Self-pay | Admitting: *Deleted

## 2019-01-20 NOTE — Telephone Encounter (Signed)
[facility] and spoke with pharmacist, Juliann Pulse  She confirmed that the list that was faxed was correct  Spoke with the pt and notified of recs per MW  She verbalized understanding and appt scheduled

## 2019-01-20 NOTE — Telephone Encounter (Signed)
-----   Message from Tanda Rockers, MD sent at 01/18/2019  8:47 AM EST ----- Call pharmacy again and let them know the only meds we have her refilling in the last 3 months are bisoprolol and amlodipine (just got doxy/ prednisone but that was from [facility] for short term)  so ask if they didn't send the whole list  - if they did , let pt know she'll need to return with all active meds and any pharmacy receipts or her lawyers will have trouble filing the paperwork she gave me as they rquire me to document  she's taking her meds and I don't have that info at present.

## 2019-01-21 NOTE — Telephone Encounter (Signed)
Spoke with the pt and advised keep the ov 01/25/2019 to fill out paperwork  She verbalized understanding  Forms on MW's desk

## 2019-01-21 NOTE — Telephone Encounter (Signed)
Since this didn't go through the [employer] process, I am not sure where these forms are. Thanks, pr

## 2019-01-25 ENCOUNTER — Encounter: Payer: Self-pay | Admitting: Internal Medicine

## 2019-01-25 ENCOUNTER — Ambulatory Visit (INDEPENDENT_AMBULATORY_CARE_PROVIDER_SITE_OTHER): Payer: PRIVATE HEALTH INSURANCE | Admitting: Internal Medicine

## 2019-01-25 VITALS — BP 150/84 | HR 70 | Temp 98.0°F | Ht 64.0 in | Wt 228.8 lb

## 2019-01-25 DIAGNOSIS — D869 Sarcoidosis, unspecified: Secondary | ICD-10-CM

## 2019-01-25 DIAGNOSIS — I10 Essential (primary) hypertension: Secondary | ICD-10-CM | POA: Diagnosis not present

## 2019-01-25 DIAGNOSIS — R600 Localized edema: Secondary | ICD-10-CM

## 2019-01-25 DIAGNOSIS — R0609 Other forms of dyspnea: Secondary | ICD-10-CM | POA: Diagnosis not present

## 2019-01-25 MED ORDER — HYDROCHLOROTHIAZIDE 25 MG PO TABS: ORAL_TABLET | ORAL | 1 refills | Status: DC

## 2019-01-25 MED ORDER — MOMETASONE FURO-FORMOTEROL FUM 100-5 MCG/ACT IN AERO: 2.0000 | INHALATION_SPRAY | Freq: Two times a day (BID) | RESPIRATORY_TRACT | 11 refills | Status: DC

## 2019-01-25 MED ORDER — BISOPROLOL FUMARATE 10 MG PO TABS: ORAL_TABLET | ORAL | 1 refills | Status: DC

## 2019-01-25 MED ORDER — GABAPENTIN 100 MG PO CAPS: 100.0000 mg | ORAL_CAPSULE | Freq: Three times a day (TID) | ORAL | 2 refills | Status: DC

## 2019-01-25 MED ORDER — AMLODIPINE BESYLATE 10 MG PO TABS: ORAL_TABLET | ORAL | Status: DC

## 2019-01-25 NOTE — Progress Notes (Signed)
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   Last worked to [facility] surgery Nov 28 2017      History of Present Illness  Last ov 02/17/13 no evidence active sarcoid rec Aciphex 20 mg Take 30-60 min before first meal of the day also when coughing for any reason take Pepcid 20 mg one at bedtime  Take delsym two tsp every 12 hours and supplement if needed with  tramadol 50 mg up to 2 every 4 hours to suppress the urge to cough. Swallowing water or using ice chips/non mint and menthol containing candies (such as lifesavers or sugarless jolly ranchers) are also effective.  You should rest your voice and avoid activities that you know make you cough. Once you have eliminated the cough for 3 straight days try reducing the tramadol first,  then the delsym as tolerated.   GERD diet    01/28/2014  Pulmonary consultation / [name]/ on no maint resp rx / rare baseline need for saba  Chief Complaint  Patient presents with  . Pulmonary Consult    Referred per Dr. Posey Pronto. [name] Cough is prod with moderate yellow sputum. She is using proair approx tid.  Onset was Jan 01 2014 while on aciphex with bfast with flu like illness but not follow contingency rx  On prednisone taper/ sp tamiflu/cough meds  Not limited by breathing from desired activities   rec Aciphex 20 mg Take 30-60 min before first meal of the day also when coughing for any reason take Pepcid 20 mg one at bedtime and chlortrimeton 4 mg at  bedtime Take delsym two tsp every 12 hours and supplement if needed with  tramadol 50 mg up to 2 every 4 hours to suppress the urge to cough.   Once you have eliminated the cough for 3 straight days try reducing the tramadol first,  then the delsym as tolerated.   GERD diet         05/07/2017 acute extended ov/[name] re: cough > sob  Chief Complaint  Patient presents with  . Acute Visit    Cough not improving, non prod "feels like something in my throat". She is waking up in the night coughing.   was doing fine for months since last ov with me but not doing well as well since 02/18/17 when placed on lisinopril and gradually  downhill since on multple courses of prednisone and now symbicort (though very poor hfa) and much worse dry cough 24/7 no longer has med calendar and doesn't even recognize the copy we have / very confused with meds  rec Stop lotensin and start valasartan 325 mg one daily  Start amlodipine 10 mg daily  Work on inhaler technique:  albuterol as a rescue medication to be used if you can't catch your breath For cough use the flutter valve and Take delsym two tsp every 12 hours and supplement if needed with  tramadol 50 mg up to 2 every 4 hours to suppress the urge to cough  Finish your prednisone Follow the med calendar daily and bring it back with you to all office visits   Surgery 09/23/17 [name] / [facility]:  - ANGIOKERATOMA.  10/21/2017  f/u ov/[name] re:  Sarcoid/ steroid dep / ocular involvmemnt  Chief Complaint  Patient presents with  . Follow-up    Increased cough x 2 wks- non prod. Her breathing is unchanged. She ran out of prednisone approx 2 wks ago and never had it refilled.   confused re details of care  ? Flare of ocular sarcoid off pred >  otc eyedrops for wetting and no further pred eyedrops Breathing worse = doe since cough since about a week or two p stopped pred though instructions say to taper to 5 mg  Not using med calendar/ not updated since 2016   rec Prednisone 5 mg daily    June 25 2018  TAH BSO > surgery ok but dx = Grade 1 stage 1B endometrial ca > RT only    01/14/2019  f/u ov/[name] re: ? Sarcoid on pred 5 mg daily p no better on 20 mg daily  Chief Complaint  Patient presents with  . Follow-up    PFT's done today. Her breathing is some better since the last visit. She states she has good days and bad days. She is using her albuterol inhaler 3 x daily on average.   Dyspnea:  On days when use Proair,  Does gxt ? Doesn't  Know speed/ slt tilted x 3 min use when not using inhaler/ walks on track x 5 min slow pace Cough: none Sleeping: 30 degrees hob SABA use:  Usually not needing at night but this am she felt needed saba  prior to pfts 02: no rec Start dulera 100 Take 2 puffs first thing in am and then another 2 puffs about 12 hours later.  Work on inhaler technique:   Only use your albuterol (proair) as a rescue  Please schedule a follow up office visit in 2  weeks, sooner if needed  with all medications /inhalers/ solutions in hand    01/25/2019  f/u ov/[name] re: sarcoid / on pred 5mg  daily / maint on dulera 100 2bid though counts are off (has 40 left, should be 12 )/ hbp with leg swelling on amlodipine  Chief Complaint  Patient presents with  . Follow-up    Pt c/o nasal congestion and HA x 2 days.   Dyspnea:  X 50 ft and can't do steps  Cough: daytime dry/ sporadic Sleeping: 30 degrees electric bed SABA use: much less  02: none    No obvious day to day or daytime variability or assoc excess/ purulent sputum or mucus plugs or hemoptysis or cp or chest tightness, subjective wheeze or overt sinus or hb symptoms.   Sleeping as above  without nocturnal  or early am exacerbation  of respiratory  c/o's or need for noct saba. Also denies any obvious fluctuation of symptoms with weather or environmental changes or other aggravating or alleviating factors except as outlined above   No unusual exposure hx or h/o childhood pna/  asthma or knowledge of premature birth.  Current Allergies, Complete Past Medical History, Past Surgical History, Family History, and Social History were reviewed in [employer] record.  ROS  The following are not active complaints unless bolded Hoarseness, sore throat, dysphagia, dental problems, itching, sneezing,  nasal congestion or discharge of excess mucus or purulent secretions, ear ache,   fever, chills, sweats, unintended wt  loss or wt gain, classically pleuritic or exertional cp,  orthopnea pnd or arm/hand swelling  or leg swelling, presyncope, palpitations, abdominal pain, anorexia, nausea, vomiting, diarrhea  or change in bowel habits or change in bladder habits, change in stools or change in urine, dysuria, hematuria,  rash, arthralgias, visual complaints, headache, numbness, weakness or ataxia or problems with walking or coordination,  change in mood or  memory.        Current Meds  Medication Sig  . albuterol (PROVENTIL HFA;VENTOLIN HFA) 108 (90 Base) MCG/ACT inhaler Inhale 2 puffs into the lungs every 4 (four) hours as needed for wheezing or shortness of breath.  Marland Kitchen amLODipine (NORVASC) 10 MG tablet Take 1 tablet (10 mg total) by mouth daily.  . bisoprolol (ZEBETA) 10 MG tablet Take 1 tablet (10 mg total) by mouth daily.  Marland Kitchen dextromethorphan (DELSYM) 30 MG/5ML liquid Take by mouth as needed for cough.   . dextromethorphan-guaiFENesin (MUCINEX DM) 30-600 MG 12hr tablet Take 1 tablet by mouth 2 (two) times daily as needed for cough.  . diphenhydrAMINE (BENADRYL) 25 MG tablet Take 25 mg by mouth every 6 (six) hours as needed.  . famotidine-calcium carbonate-magnesium hydroxide (PEPCID COMPLETE) 10-800-165 MG chewable tablet Chew 2 tablets by mouth at bedtime.  . hydrochlorothiazide (HYDRODIURIL) 25 MG tablet Take 1 tablet (25 mg total) by mouth every morning.  . loratadine (CLARITIN) 10 MG tablet Take 10 mg by mouth daily as needed for allergies.  .  mometasone-formoterol (DULERA) 100-5 MCG/ACT AERO Inhale 2 puffs into the lungs 2 (two) times daily.  . Multiple Vitamins-Minerals (AIRBORNE PO) Take by mouth as needed.  Marland Kitchen oxycodone (OXY-IR) 5 MG capsule Take 5 mg by mouth every 4 (four) hours as needed.  . predniSONE (DELTASONE) 10 MG tablet Take 5 mg by mouth daily with breakfast.  . RABEprazole (ACIPHEX) 20 MG tablet Take 20 mg by mouth daily before breakfast.  . simvastatin (ZOCOR) 20 MG tablet Take 20 mg by mouth daily.  . traMADol (ULTRAM) 50 MG tablet 1-2 every 4 hours as needed for cough or pain (Patient taking differently: Take 50 mg by mouth every 4 (four) hours as needed for moderate pain (cough). )  . Triamcinolone Acetonide (TRIAMCINOLONE 0.1 % CREAM : EUCERIN) CREA Apply 1 application topically daily as needed for rash or itching. (Patient taking differently: Apply 1 application topically daily. )       .         Objective:   Physical Exam    Obese bf nad   01/25/2019      228   01/14/2019      229  10/23/2018      229  09/17/2018      227  06/16/2018        225  10/21/2017      224  07/07/2017      223  05/22/2017         219  05/07/2017       222  08/22/2016         216   05/17/2016        215   05/02/15 219 lb (99.338 kg)  04/07/15 219 lb 9.6 oz (99.61 kg)  03/24/15 216 lb (97.977 kg)      Vital signs reviewed - Note on arrival 02 sats  94% on RA  HEENT: nl dentition, turbinates bilaterally, and oropharynx. Nl external ear canals without cough reflex   NECK :  without JVD/Nodes/TM/ nl carotid upstrokes bilaterally  LUNGS: no acc muscle use,  Nl contour chest which is clear to A and P bilaterally without cough on insp or exp maneuvers   CV:  RRR  no s3 or murmur or increase in P2, and  1+ bilateral Lower ext edema   ABD: obese/  soft and nontender with nl inspiratory excursion in the supine position. No bruits or organomegaly appreciated, bowel sounds nl  MS:  Nl gait/ ext warm without deformities, calf  tenderness, cyanosis or clubbing No obvious joint restrictions   SKIN: warm and dry without lesions    NEURO:  alert, approp, nl sensorium with  no motor or cerebellar deficits apparent.                 Assessment & Plan:

## 2019-01-25 NOTE — Patient Instructions (Addendum)
Dulera 100 Take 2 puffs first thing in am and then another 2 puffs about 12 hours later.   Work on inhaler technique:  relax and gently blow all the way out then take a nice smooth deep breath back in, triggering the inhaler at same time you start breathing in.  Hold for up to 5 seconds if you can. Blow out thru nose. Rinse and gargle with water when done     Only use your albuterol as a rescue medication to be used if you can't catch your breath by resting or doing a relaxed purse lip breathing pattern.  - The less you use it, the better it will work when you need it. - Ok to use up to 2 puffs  every 4 hours if you must but call for immediate appointment if use goes up over your usual need - Don't leave home without it !!  (think of it like the spare tire for your car)    Gabapentin 100 mg three times a day   Change your appt insteady of me see [name] w/in 4  weeks with all your medications, even over the counter meds, separated in two separate bags, the ones you take no matter what vs the ones you stop once you feel better and take only as needed when you feel you need them.   [name]  will generate for you a new user friendly medication calendar that will put [place] all on the same page re: your medication use.     Without this process, it simply isn't possible to assure that we are providing  your outpatient care  with  the attention to detail we feel you deserve.   If we cannot assure that you're getting that kind of care,  then we cannot manage your problem effectively from this clinic.  Once you have seen [name] and we are sure that we're all on the same page with your medication use she will arrange follow up with me.  Add : advised to d/c amlodipine altogether

## 2019-01-26 ENCOUNTER — Encounter: Payer: Self-pay | Admitting: Internal Medicine

## 2019-01-26 ENCOUNTER — Telehealth: Payer: Self-pay | Admitting: *Deleted

## 2019-01-26 NOTE — Telephone Encounter (Signed)
LMTCB

## 2019-01-26 NOTE — Assessment & Plan Note (Signed)
- TBBX pos ncg 01/22/08  - Chronic pred rx 2/09> 04/2008  - Restart Pred December 26, 2008 > June 12, 2009 stopped  > restarted 07/11/11>>pt stopped 10/15/11  - PFT's March 23, 2010 VC 1.77 (66%) no obst, ERV 34%, DLCO 53 > 115% corrected - PFT's 09/30/12       VC 2.02 (57%) ratio 78 , erv 13%  DLCO 55 > 118% corrected - Skin bx 05/19/14 bridge of nose > Pos NCG - 05/02/2015  Walked RA x 3 laps @ 185 ft each stopped due to end of study, no sob/ no desat at nl pace/ no cough provoked  - PFT's  08/22/2016 VC  1.81 ratio 82  with DLCO  59/55 % corrects to 88 % for alv volume  - Spirometry 05/07/2017   FVC  1.81  With no obst by fev1/fvc and no significant curvature  - 05/07/2017  After extensive coaching HFA effectiveness =    75% from a baseline of < 25%  - 05/22/2017   Walked RA x one lap @ 185 stopped due to  Sob at fast pace with sats 96% - 09/09/17 placed on floor dose of 5 mg daily and flared late Oct 2018 when ran out of rx  - Spirometry 06/16/2018  FVC  1.82 (62%) s obst  - 06/16/2018   Walked RA x one lap @ 185 stopped due to  Sob, mod fast pace, no desats  - 09/17/2018   Walked RA x one lap @ 185 stopped due to  Sob/ no desat on ? Dose prednisone with ESR 74 so rec pred 20 ub then taper to 5 mg   - 01/14/2019    try dulera 100 2bid x 2 week sample then return in 2 weeks to regroup/ count use > only 20 puffs gone, confused with instructions and not sure helping  - The proper method of use, as well as anticipated side effects, of a metered-dose inhaler are discussed and demonstrated to the patient.     No clear there is much sarcoid involving airways here with cough more typical of uacs.  Upper airway cough syndrome (previously labeled PNDS),  is so named because it's frequently impossible to sort out how much is  CR/sinusitis with freq throat clearing (which can be related to primary GERD)   vs  causing  secondary (" extra esophageal")  GERD from wide swings in gastric pressure that occur with throat clearing,  often  promoting self use of mint and menthol lozenges that reduce the lower esophageal sphincter tone and exacerbate the problem further in a cyclical fashion.   These are the same pts (now being labeled as having "irritable larynx syndrome" by some cough centers) who not infrequently have a history of having failed to tolerate ace inhibitors,  dry powder inhalers or biphosphonates or report having atypical/extraesophageal reflux symptoms that don't respond to standard doses of PPI  and are easily confused as having aecopd or asthma flares by even experienced allergists/ pulmonologists (myself included).    rec trial of gabapentin 100 tid   I had an extended discussion with the patient reviewing all relevant studies completed to date and  lasting 15 to 20 minutes of a 25 minute visit    See device teaching which extended face to face time for this visit.  Each maintenance medication was reviewed in detail including emphasizing most importantly the difference between maintenance and prns and under what circumstances the prns are to be triggered using an  action plan format that is not reflected in the computer generated alphabetically organized AVS which I have not found useful in most complex patients, especially with respiratory illnesses  Please see AVS for specific instructions unique to this visit that I personally wrote and verbalized to the the pt in detail and then reviewed with pt  by my nurse highlighting any  changes in therapy recommended at today's visit to their plan of care.

## 2019-01-26 NOTE — Assessment & Plan Note (Signed)
Venous doppler on Right neg for DVT. 09/09/2011   Double hctz to 50 mg daily

## 2019-01-26 NOTE — Telephone Encounter (Signed)
-----   Message from Tanda Rockers, MD sent at 01/26/2019  5:06 AM EST ----- On further thought, tell her I think it's best to stop the amlodipine altogether.

## 2019-01-26 NOTE — Assessment & Plan Note (Signed)
Not able to work since 11/2017 Worse since  06/2018 pelvic surgery  - 09/17/2018   Walked RA x one lap @ 185 stopped due to  Sob/ no desat/ moderate pace - Spirometry 09/17/2018  FEV1 1.0 (49%)  Ratio 66 s prior  But curve is typical of VCD not asthma - 09/17/2018  After extensive coaching inhaler device,  effectiveness =    75%  - 01/14/2019   Walked RA x one lap =  approx 250 ft - stopped due to sob no desats, slow pace  - PFT's  01/14/2019  FEV1 1.43  (68 % ) ratio 0.80  p 10 % improvement from saba p saba prior to study with DLCO  57 % corrects to 86 % for alv volume  ERV 35 on pred 5 mg  daily  - virtually identical to study done 08/22/16 @ wt =  216 vs 229 01/14/2019    She likely has restrictive changes from "burned out sarcoid" and obesity that is partly related to taking prednisone and 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

## 2019-01-26 NOTE — Assessment & Plan Note (Signed)
D/c acei 05/07/2017 due to cough  > improved D/c'd amlodipine due to LE swelling 01/25/2019   Will increase bisoprolol to 10 mg bid and double hctz to 50 mg daily to make up for loss of amlodipine and return for recheck and med calendar in 4 weeks

## 2019-01-27 NOTE — Telephone Encounter (Signed)
I called and spoke with the pt and notified of recs per MW  She verbalized understanding  Nothing further needed

## 2019-01-28 ENCOUNTER — Ambulatory Visit: Payer: PRIVATE HEALTH INSURANCE | Admitting: Internal Medicine

## 2019-02-18 ENCOUNTER — Ambulatory Visit: Payer: Self-pay

## 2019-02-18 NOTE — Telephone Encounter (Signed)
Patient called in with c/o "constipation." She says "I was started on pain medication on Saturday when I fell. I had a BM on Monday, but I haven't been able to go since then. Normally, I don't have a problem going. I had my gallbladder out and I usually have really loose stools anyway. I've been taking OTC stool softeners, drinking hot prune juice, apple juice, and nothing is working. I keep getting up to go, but nothing comes out. I took 2 Dulcolax on Monday and Tuesday. This morning I started having very mild lower abdominal pain, but no fever, no nausea, no vomiting." I asked is her abdomen swollen, she denies. According to protocol, see PCP within 3 days, appointment scheduled for tomorrow, 02/19/19 at 1200, care advice given, patient verbalized understanding.  Reason for Disposition . Unable to have a bowel movement (BM) without laxative or enema  Answer Assessment - Initial Assessment Questions 1. STOOL PATTERN OR FREQUENCY: "How often do you pass bowel movements (BMs)?"  (Normal range: tid to q 3 days)  "When was the last BM passed?"       Usually don't have a problem. Been on pain medication since the weekend. LBM Monday 2. STRAINING: "Do you have to strain to have a BM?"      Yes this week, but normally no 3. RECTAL PAIN: "Does your rectum hurt when the stool comes out?" If so, ask: "Do you have hemorrhoids? How bad is the pain?"  (Scale 1-10; or mild, moderate, severe)     Yes, no hemorrhoids, 10 pain 4. STOOL COMPOSITION: "Are the stools hard?"      Yes 5. BLOOD ON STOOLS: "Has there been any blood on the toilet tissue or on the surface of the BM?" If so, ask: "When was the last time?"      No 6. CHRONIC CONSTIPATION: "Is this a new problem for you?"  If no, ask: "How long have you had this problem?" (days, weeks, months)      Yes 7. CHANGES IN DIET: "Have there been any recent changes in your diet?"      No 8. MEDICATIONS: "Have you been taking any new medications?"     Pain pills  started on Saturday 9. LAXATIVES: "Have you been using any laxatives or enemas?"  If yes, ask "What, how often, and when was the last time?"     Dulcolax 2 on Monday, 2 on Tuesday 10. CAUSE: "What do you think is causing the constipation?"        Pain medication 11. OTHER SYMPTOMS: "Do you have any other symptoms?" (e.g., abdominal pain, fever, vomiting)       Abdominal pain (not that bad) started this morning 12. PREGNANCY: "Is there any chance you are pregnant?" "When was your last menstrual period?"       No  Protocols used: CONSTIPATION-A-AH

## 2019-02-19 ENCOUNTER — Ambulatory Visit (INDEPENDENT_AMBULATORY_CARE_PROVIDER_SITE_OTHER): Payer: PRIVATE HEALTH INSURANCE

## 2019-02-19 ENCOUNTER — Encounter: Payer: Self-pay | Admitting: Family Medicine

## 2019-02-19 ENCOUNTER — Ambulatory Visit (INDEPENDENT_AMBULATORY_CARE_PROVIDER_SITE_OTHER): Payer: PRIVATE HEALTH INSURANCE | Admitting: Family Medicine

## 2019-02-19 VITALS — BP 130/72 | HR 91 | Temp 98.4°F | Resp 12

## 2019-02-19 DIAGNOSIS — K5900 Constipation, unspecified: Secondary | ICD-10-CM | POA: Diagnosis not present

## 2019-02-19 DIAGNOSIS — R1030 Lower abdominal pain, unspecified: Secondary | ICD-10-CM | POA: Diagnosis not present

## 2019-02-19 DIAGNOSIS — C541 Malignant neoplasm of endometrium: Secondary | ICD-10-CM

## 2019-02-19 DIAGNOSIS — Z85048 Personal history of other malignant neoplasm of rectum, rectosigmoid junction, and anus: Secondary | ICD-10-CM

## 2019-02-19 LAB — BASIC METABOLIC PANEL
BUN: 14 mg/dL (ref 6–23)
CALCIUM: 9 mg/dL (ref 8.4–10.5)
CHLORIDE: 97 meq/L (ref 96–112)
CO2: 30 meq/L (ref 19–32)
Creatinine, Ser: 0.84 mg/dL (ref 0.40–1.20)
GFR: 83.63 mL/min (ref >60.00)
Glucose, Bld: 122 mg/dL — ABNORMAL HIGH (ref 70–99)
Potassium: 4 mEq/L (ref 3.5–5.1)
SODIUM: 136 meq/L (ref 135–145)

## 2019-02-19 LAB — TSH: TSH: 3.84 u[IU]/mL (ref 0.35–4.50)

## 2019-02-19 MED ORDER — BISACODYL 10 MG RE SUPP: 10.0000 mg | RECTAL | 0 refills | Status: DC | PRN

## 2019-02-19 NOTE — Patient Instructions (Addendum)
A few things to remember from today's visit:   Constipation, unspecified constipation type - Plan: DG Abd 2 Views, Basic metabolic panel, TSH, bisacodyl (DULCOLAX) 10 MG suppository  Lower abdominal pain - Plan: DG Abd 2 Views, Urinalysis, Routine w reflex microscopic  Recommend trying MiraLAX over-the-counter, daily until you have a good bowel movement. Adequate fiber intake, Benefiber 1 teaspoon twice daily. Increase fluid intake.    I hope you feel better soon!

## 2019-02-19 NOTE — Progress Notes (Signed)
ACUTE VISIT   HPI:  Chief Complaint  Patient presents with  . Constipation    started Monday    [name] is a 63 y.o. female, who is here today with her daughter complaining of constipation since she started taking Hydrocodone. Hx of anal cancer and recently Dx with endometrial cancer. Denies prior Hx of constipation.  She fell on 02/14/19 and Hydrocodone-Acetaminophen was prescribed for musculoskeletal pain. Last time she took Hydrocodone 2-3 days ago.  Last bowel movement 5 days ago. She feels the urge but cannot pass stool,strainig causes pain.  Lower abdominal cramps,intermittent,"bad pain." Pain is not radiated. Not identified exacerbating factors. Alleviated by passing gas.  She has tried OTC stool softener. She has not tried Miralax.  She denies fever,fever,nausea,vomiting,or urinary symptoms. Negative for rectal bleed.  Problem is getting worse. Abdominal surgeries: Hysterectomy. Last colonoscopy 04/2018.   Review of Systems  Constitutional: Positive for activity change and fatigue. Negative for appetite change and fever.  HENT: Negative for mouth sores, sore throat and trouble swallowing.   Respiratory: Negative for cough, shortness of breath and wheezing.   Gastrointestinal: Positive for abdominal pain and constipation. Negative for abdominal distention, anal bleeding, nausea and vomiting.  Endocrine: Negative for cold intolerance and heat intolerance.  Genitourinary: Negative for dysuria, hematuria, vaginal bleeding and vaginal discharge.  Musculoskeletal: Positive for arthralgias. Negative for back pain and neck pain.    Current Outpatient Medications on File Prior to Visit  Medication Sig Dispense Refill  . albuterol (PROVENTIL HFA;VENTOLIN HFA) 108 (90 Base) MCG/ACT inhaler Inhale 2 puffs into the lungs every 4 (four) hours as needed for wheezing or shortness of breath. 1 Inhaler 5  . bisoprolol (ZEBETA) 10 MG tablet One twice daily 180  tablet 1  . dextromethorphan (DELSYM) 30 MG/5ML liquid Take by mouth as needed for cough.     . diphenhydrAMINE (BENADRYL) 25 MG tablet Take 25 mg by mouth every 6 (six) hours as needed.    . famotidine-calcium carbonate-magnesium hydroxide (PEPCID COMPLETE) 10-800-165 MG chewable tablet Chew 2 tablets by mouth at bedtime.    . gabapentin (NEURONTIN) 100 MG capsule Take 1 capsule (100 mg total) by mouth 3 (three) times daily. One three times daily 90 capsule 2  . hydrochlorothiazide (HYDRODIURIL) 25 MG tablet 2 daily 180 tablet 1  . HYDROcodone-acetaminophen (NORCO/VICODIN) 5-325 MG tablet     . loratadine (CLARITIN) 10 MG tablet Take 10 mg by mouth daily as needed for allergies.    . mometasone-formoterol (DULERA) 100-5 MCG/ACT AERO Inhale 2 puffs into the lungs 2 (two) times daily. 1 Inhaler 11  . Multiple Vitamins-Minerals (AIRBORNE PO) Take by mouth as needed.    Marland Kitchen oxycodone (OXY-IR) 5 MG capsule Take 5 mg by mouth every 4 (four) hours as needed.    . predniSONE (DELTASONE) 10 MG tablet Take 5 mg by mouth daily with breakfast.    . RABEprazole (ACIPHEX) 20 MG tablet Take 20 mg by mouth daily before breakfast.    . simvastatin (ZOCOR) 20 MG tablet Take 20 mg by mouth daily.    . traMADol (ULTRAM) 50 MG tablet 1-2 every 4 hours as needed for cough or pain (Patient taking differently: Take 50 mg by mouth every 4 (four) hours as needed for moderate pain (cough). ) 40 tablet 0  . Triamcinolone Acetonide (TRIAMCINOLONE 0.1 % CREAM : EUCERIN) CREA Apply 1 application topically daily as needed for rash or itching. (Patient taking differently: Apply 1 application topically daily. )  500 each 0   No current facility-administered medications on file prior to visit.      Past Medical History:  Diagnosis Date  . Bifid uvula   . GERD (gastroesophageal reflux disease)   . History of cervical dysplasia   . History of chemotherapy    06/ 2005  . History of cleft lip    CORRECTED  . History of  condyloma acuminatum    PERIANAL  . History of radiation therapy    completed 06/ 2005  . 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 History of vulvar dysplasia   . Hypertension   . 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 Pulmonary nodule, left   . 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 Sarcoid uveitis of left eye goes to [facility]--  currently no issues per pt 11-27-2016  . Upper airway cough syndrome   . Wears glasses    Allergies  Allergen Reactions  . Shellfish Allergy Hives, Itching and Swelling  . Penicillins Hives and Itching    Has patient had a PCN reaction causing immediate rash, facial/tongue/throat swelling, SOB or lightheadedness with hypotension: Yes Has patient had a PCN reaction causing severe rash involving mucus membranes or skin necrosis: Yes Has patient had a PCN reaction that required hospitalization: Yes Has patient had a PCN reaction occurring within the last 10 years: No If all of the above answers are "NO", then may proceed with Cephalosporin use.     Social History   Socioeconomic History  . Marital status: Single    Spouse name: Not on file  . Number of children: Not on file  . Years of education: Not on file  . Highest education level: Not on file  Occupational History  . Occupation: Tech 1    Employer: [employer]  . Financial resource strain: Not on file  . Food insecurity:    Worry: Not on file    Inability: Not on file  . Transportation needs:    Medical: Not on file    Non-medical: Not on file  Tobacco Use  . Smoking status: Former Smoker    Packs/day: 0.50    Years: 1.00    Pack years: 0.50    Types: Cigarettes    Last attempt to quit: 12/16/1993    Years since quitting: 25.1  . Smokeless tobacco: Never Used  Substance and Sexual Activity  . Alcohol use: Yes    Comment:  occasional  . Drug use: No  . Sexual activity: Never  Lifestyle  . Physical activity:    Days per week: Not on file    Minutes per session: Not on file  . Stress: Not on file  Relationships  . Social connections:    Talks on phone: Not on file    Gets together: Not on file    Attends religious service: Not on file    Active member of club or organization: Not on file    Attends meetings of clubs or organizations: Not on file    Relationship status: Not on file  Other Topics Concern  . Not on file  Social History Narrative  . Not on file    Vitals:   02/19/19 1207  BP: 130/72  Pulse: 91  Resp: 12  Temp: 98.4 F (36.9 C)  SpO2: 96%  There is no height or weight on file to calculate BMI.   Physical Exam  Nursing note and vitals reviewed. Constitutional: She is oriented to person, place, and time. She appears well-developed. She does not appear ill. No distress.  HENT:  Head: Normocephalic and atraumatic.  Mouth/Throat: Oropharynx is clear and moist and mucous membranes are normal.  Eyes: Conjunctivae are normal. No scleral icterus.  Cardiovascular: Normal rate and regular rhythm.  No murmur heard. Respiratory: Effort normal and breath sounds normal. No respiratory distress.  GI: Soft. Bowel sounds are normal. She exhibits no distension and no mass. There is no abdominal tenderness.  Musculoskeletal:        General: No edema.  Neurological: She is alert and oriented to person, place, and time. She has normal strength.  She is in a wheel chair.  Skin: Skin is warm. No rash noted. No erythema.  Psychiatric: She has a normal mood and affect.  Well groomed, good eye contact.    ASSESSMENT AND PLAN:  [name] was seen today for constipation.  Diagnoses and all orders for this visit:  Lab Results  Component Value Date   TSH 3.84 02/19/2019   Lab Results  Component Value Date   CREATININE 0.84 02/19/2019   BUN 14 02/19/2019   NA 136 02/19/2019   K 4.0  02/19/2019   CL 97 02/19/2019   CO2 30 02/19/2019    Constipation, unspecified constipation type Most likely related to opioid side effect. Other possible etiologies discussed. Adequate fiber intake and hydration. Recommend trying Miralax daily until having a bowel movement Bisacodyl supp. Side effects of meds discussed. Instructed about warning signs.   -     DG Abd 2 Views; Future -     Basic metabolic panel -     TSH -     bisacodyl (DULCOLAX) 10 MG suppository; Place 1 suppository (10 mg total) rectally as needed for moderate constipation.  Lower abdominal pain Abdominal examination today does not suggest a serious process. Most likely caused or aggravated by constipation. Clearly intructed about warning signs,she and her daughter voice understanding.  -     DG Abd 2 Views; Future -     Urinalysis, Routine w reflex microscopic    Return if symptoms worsen or fail to improve.     [name] G. [name], MD  [facility]. [place] office.

## 2019-02-20 ENCOUNTER — Encounter: Payer: Self-pay | Admitting: Family Medicine

## 2019-02-22 ENCOUNTER — Encounter: Payer: Self-pay | Admitting: Adult Health

## 2019-02-22 ENCOUNTER — Ambulatory Visit (INDEPENDENT_AMBULATORY_CARE_PROVIDER_SITE_OTHER): Payer: PRIVATE HEALTH INSURANCE | Admitting: Adult Health

## 2019-02-22 DIAGNOSIS — R05 Cough: Secondary | ICD-10-CM | POA: Diagnosis not present

## 2019-02-22 DIAGNOSIS — R058 Other specified cough: Secondary | ICD-10-CM

## 2019-02-22 DIAGNOSIS — D869 Sarcoidosis, unspecified: Secondary | ICD-10-CM | POA: Diagnosis not present

## 2019-02-22 MED ORDER — MOMETASONE FURO-FORMOTEROL FUM 100-5 MCG/ACT IN AERO: 2.0000 | INHALATION_SPRAY | Freq: Two times a day (BID) | RESPIRATORY_TRACT | 5 refills | Status: DC

## 2019-02-22 MED ORDER — MOMETASONE FURO-FORMOTEROL FUM 100-5 MCG/ACT IN AERO: 2.0000 | INHALATION_SPRAY | Freq: Two times a day (BID) | RESPIRATORY_TRACT | 0 refills | Status: DC

## 2019-02-22 NOTE — Patient Instructions (Addendum)
Follow medication calendar closely and bring to each visit  Continue on current regimen  Continue yearly eye exam and dermatology checks.  Follow up with Dr. Melvyn Novas  In 3-4 months and As needed

## 2019-02-22 NOTE — Assessment & Plan Note (Signed)
Improved on current regimen 

## 2019-02-22 NOTE — Assessment & Plan Note (Signed)
Recent flare now improving.  Patient is continue on her current regimen Patient's medications were reviewed today and patient education was given. Computerized medication calendar was adjusted/completed   Plan  Patient Instructions  Follow medication calendar closely and bring to each visit  Continue on current regimen  Continue yearly eye exam and dermatology checks.  Follow up with Dr. Melvyn Novas  In 3-4 months and As needed

## 2019-02-22 NOTE — Progress Notes (Signed)
@Patient  ID: [name], female    DOB: [birth year], 63 y.o.   MRN: [id number]  Chief Complaint  Patient presents with  . Follow-up    Sarcoid     Referring provider: [name], [name], MD  HPI: 63 year old African American female with minimum smoking history followed for sarcoid (Pulmonary , ocular , skin involvement )  Seen for initial pulmonary consult 2015 Anal  cancer 2005 , s/p chemo and XRT .   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 Last chronic prednisone 04/15/08 with no flare cough no nodules (right leg and left hand and right arm) 04/2017 ACE inhibitor d/c  TBBX pos ncg 01/22/08  - Chronic pred rx 2/09>04/2008  - Restart Pred December 26, 2008 >June 12, 2009 stopped >restarted 07/11/11>>pt stopped 10/15/11  - PFT's March 23, 2010 VC 1.77 (66%) no obst, ERV 34%, DLCO 53 > 115% corrected - PFT's 09/30/12 VC 2.02 (57%) ratio 78 , erv 13% DLCO 55 >118% corrected - Skin bx 05/19/14 bridge of nose > Pos NCG - 5/17/2016Walked RA x 3 laps @ 185 ft each stopped due to end of study, no sob/ no desat at nl pace/ no cough provoked  - PFT's 9/7/2017VC 1.81 ratio 82 with DLCO 59/55 % corrects to 88 % for alv volume  - Spirometry 5/23/2018FVC 1.81 With no obst by fev1/fvc and no significant curvature  - 5/23/2018After extensive coaching HFA effectiveness = 75% from a baseline of <25%  - 6/7/2018Walked RA x one lap @ 185 stopped due to Sob at fast pace with sats 96%  09/09/17 placed on floor dose of 5 mg daily and flared late Oct 2018 when ran out of rx  - Spirometry 06/16/2018  FVC  1.82 (62%) s obst  - 06/16/2018   Walked RA x one lap @ 185 stopped due to  Sob, mod fast pace, no desats  - 09/17/2018   Walked RA x one lap @ 185 stopped due to  Sob/ no desat on ? Dose prednisone with ESR 74 so rec  pred 20 ub then taper to 5 mg   - 01/14/2019    try dulera 100 2bid x 2 week sample then return in 2 weeks to regroup/ count use > only 20 puffs gone, confused with 2bid instruction     02/22/2019 Follow up : Sarcoid and Med Review  Patient returns for 1 month follow up for Sarcoid . Last visit was having a sarcoid and cough flare.  She was recommended to continue [facility].  Started on gabapentin 100 mg 3 times daily for cough. She was also recommended to discontinue Norvasc.  She says she is feeling much better.  Her cough is back to baseline.  She feels that her breathing is improved.  She did not start the gabapentin because her cough started to improve.  She also was not able to stop the Norvasc due to her blood pressure being elevated.  She says she is feeling much better.  She denies any flare of cough or wheezing.  We reviewed her medications organize them into a medication calendar.  She appears to be taking correctly.  Needs a refill prescription for [facility] sent to her pharmacy..  Patient has planned eye exam and dermatology check coming up.  She says she recently had a fall who has injured her knee and foot.  She has been following with orthopedics for  this.  Is starting to get better.  Allergies  Allergen Reactions  . Shellfish Allergy Hives, Itching and Swelling  . Penicillins Hives and Itching    Has patient had a PCN reaction causing immediate rash, facial/tongue/throat swelling, SOB or lightheadedness with hypotension: Yes Has patient had a PCN reaction causing severe rash involving mucus membranes or skin necrosis: Yes Has patient had a PCN reaction that required hospitalization: Yes Has patient had a PCN reaction occurring within the last 10 years: No If all of the above answers are "NO", then may proceed with Cephalosporin use.     Immunization History  Administered Date(s) Administered  . Influenza Split 10/01/2011, 09/23/2012  . Influenza Whole 09/15/2010  . Tdap  07/06/2013    Past Medical History:  Diagnosis Date  . Bifid uvula   . GERD (gastroesophageal reflux disease)   . History of cervical dysplasia   . History of chemotherapy    06/ 2005  . History of cleft lip    CORRECTED  . History of condyloma acuminatum    PERIANAL  . History of radiation therapy    completed 06/ 2005  . 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 History of vulvar dysplasia   . Hypertension   . 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 Pulmonary nodule, left   . 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 Sarcoid uveitis of left eye goes to [facility]--  currently no issues per pt 11-27-2016  . Upper airway cough syndrome   . Wears glasses     Tobacco History: Social History   Tobacco Use  Smoking Status Former Smoker  . Packs/day: 0.50  . Years: 1.00  . Pack years: 0.50  . Types: Cigarettes  . Last attempt to quit: 12/16/1993  . Years since quitting: 25.2  Smokeless Tobacco Never Used   Counseling given: Not Answered   Outpatient Medications Prior to Visit  Medication Sig Dispense Refill  . albuterol (PROVENTIL HFA;VENTOLIN HFA) 108 (90 Base) MCG/ACT inhaler Inhale 2 puffs into the lungs every 4 (four) hours as needed for wheezing or shortness of breath. 1 Inhaler 5  . bisacodyl (DULCOLAX) 10 MG suppository Place 1 suppository (10 mg total) rectally as needed for moderate constipation. 12 suppository 0  . bisoprolol (ZEBETA) 10 MG tablet One twice daily 180 tablet 1  . dextromethorphan (DELSYM) 30 MG/5ML liquid Take by mouth as needed for cough.     . diphenhydrAMINE (BENADRYL) 25 MG tablet Take 25 mg by mouth every 6 (six) hours as needed.    . famotidine-calcium carbonate-magnesium hydroxide (PEPCID COMPLETE) 10-800-165 MG chewable tablet Chew 2 tablets by mouth at bedtime.    . gabapentin (NEURONTIN) 100 MG  capsule Take 1 capsule (100 mg total) by mouth 3 (three) times daily. One three times daily 90 capsule 2  . hydrochlorothiazide (HYDRODIURIL) 25 MG tablet 2 daily 180 tablet 1  . HYDROcodone-acetaminophen (NORCO/VICODIN) 5-325 MG tablet     . loratadine (CLARITIN) 10 MG tablet Take 10 mg by mouth daily as needed for allergies.    . Multiple Vitamins-Minerals (AIRBORNE PO) Take by mouth as needed.    Marland Kitchen oxycodone (OXY-IR) 5 MG capsule Take 5 mg by mouth every 4 (four) hours as needed.    . predniSONE (DELTASONE) 10 MG tablet Take 5 mg by mouth daily with breakfast.    .  RABEprazole (ACIPHEX) 20 MG tablet Take 20 mg by mouth daily before breakfast.    . simvastatin (ZOCOR) 20 MG tablet Take 20 mg by mouth daily.    . traMADol (ULTRAM) 50 MG tablet 1-2 every 4 hours as needed for cough or pain (Patient taking differently: Take 50 mg by mouth every 4 (four) hours as needed for moderate pain (cough). ) 40 tablet 0  . Triamcinolone Acetonide (TRIAMCINOLONE 0.1 % CREAM : EUCERIN) CREA Apply 1 application topically daily as needed for rash or itching. (Patient taking differently: Apply 1 application topically daily. ) 500 each 0  . mometasone-formoterol (DULERA) 100-5 MCG/ACT AERO Inhale 2 puffs into the lungs 2 (two) times daily. 1 Inhaler 11   No facility-administered medications prior to visit.      Review of Systems:   Constitutional:   No  weight loss, night sweats,  Fevers, chills,  +fatigue, or  lassitude.  HEENT:   No headaches,  Difficulty swallowing,  Tooth/dental problems, or  Sore throat,                No sneezing, itching, ear ache,  +nasal congestion, post nasal drip,   CV:  No chest pain,  Orthopnea, PND, swelling in lower extremities, anasarca, dizziness, palpitations, syncope.   GI  No heartburn, indigestion, abdominal pain, nausea, vomiting, diarrhea, change in bowel habits, loss of appetite, bloody stools.   Resp: No shortness of breath with exertion or at rest.  No excess  mucus, no productive cough,  No non-productive cough,  No coughing up of blood.  No change in color of mucus.  No wheezing.  No chest wall deformity  Skin: no rash or lesions.  GU: no dysuria, change in color of urine, no urgency or frequency.  No flank pain, no hematuria   MS:  No joint pain or swelling.  No decreased range of motion.  No back pain.    Physical Exam  BP 126/74 (BP Location: Left Arm, Cuff Size: Normal)   Pulse 81   Ht 5' 5.5" (1.664 m)   Wt 225 lb 6.4 oz (102.2 kg)   LMP 12/17/2003   SpO2 96%   BMI 36.94 kg/m   GEN: A/Ox3; pleasant , NAD, well nourished    HEENT:  [place]/AT,  EACs-clear, TMs-wnl, NOSE-clear, THROAT-clear, no lesions, no postnasal drip or exudate noted.   NECK:  Supple w/ fair ROM; no JVD; normal carotid impulses w/o bruits; no thyromegaly or nodules palpated; no lymphadenopathy.    RESP  Clear  P & A; w/o, wheezes/ rales/ or rhonchi. no accessory muscle use, no dullness to percussion  CARD:  RRR, no m/r/g, tr  peripheral edema, pulses intact, no cyanosis or clubbing.  GI:   Soft & nt; nml bowel sounds; no organomegaly or masses detected.   Musco: Warm bil, no deformities or joint swelling noted.   Neuro: alert, no focal deficits noted.    Skin: Warm, no lesions or rashes    Lab Results:  CBC   BNP No results found for: BNP   Imaging:     PFT Results Latest Ref Rng & Units 01/14/2019 08/22/2016  FVC-Pre L 1.59 1.70  FVC-Predicted Pre % 59 61  FVC-Post L 1.79 1.77  FVC-Predicted Post % 67 63  Pre FEV1/FVC % % 81 80  Post FEV1/FCV % % 80 82  FEV1-Pre L 1.29 1.37  FEV1-Predicted Pre % 61 62  FEV1-Post L 1.43 1.45  DLCO UNC% % 57 59  DLCO COR %  Predicted % 86 88  TLC L 3.42 3.28  TLC % Predicted % 67 63  RV % Predicted % 77 74    Lab Results  Component Value Date   NITRICOXIDE 10 08/22/2016        Assessment & Plan:   Sarcoidosis ([place]) Recent flare now improving.  Patient is continue on her current  regimen Patient's medications were reviewed today and patient education was given. Computerized medication calendar was adjusted/completed   Plan  Patient Instructions  Follow medication calendar closely and bring to each visit  Continue on current regimen  Continue yearly eye exam and dermatology checks.  Follow up with Dr. Melvyn Novas  In 3-4 months and As needed       Upper airway cough syndrome Improved on current regimen     Rexene Edison, NP 02/22/2019

## 2019-02-23 ENCOUNTER — Telehealth: Payer: Self-pay | Admitting: Internal Medicine

## 2019-02-23 NOTE — Telephone Encounter (Signed)
Rosana Berger, CMA       9:30 AM  Note    ----- Message from Tanda Rockers, MD sent at 01/26/2019  5:06 AM EST ----- On further thought, tell her I think it's best to stop the amlodipine altogether.     ____________________________________  Hulen Skains and spoke with patient, asked her if the medication amlodipine is still on the list. Patient stated that it was, advised patient of above message. This has been fixed on our end. Nothing further needed.

## 2019-03-04 NOTE — Progress Notes (Signed)
Chart and office note reviewed in detail  > agree with a/p as outlined    

## 2019-03-24 ENCOUNTER — Encounter: Payer: Self-pay | Admitting: General Practice

## 2019-03-24 NOTE — Progress Notes (Signed)
[facility] Support Services   [facility] Support Services Team contacted patient to assess for food insecurity and other psychosocial needs during current COVID19 pandemic.    Patient/family expressed no needs at this time.  Support Team member encouraged patient to call if changes occur or they have any other questions/concerns.    Anne C Cunningham, LCSW  [facility]        

## 2019-03-25 ENCOUNTER — Ambulatory Visit
Admission: RE | Admit: 2019-03-25 | Payer: PRIVATE HEALTH INSURANCE | Source: Ambulatory Visit | Admitting: Radiation Oncology

## 2019-04-02 NOTE — Addendum Note (Signed)
Addended by: Parke Poisson E on: 04/02/2019 04:02 PM   Modules accepted: Orders

## 2019-04-13 ENCOUNTER — Other Ambulatory Visit: Payer: Self-pay | Admitting: Pulmonary Disease

## 2019-04-15 ENCOUNTER — Telehealth: Payer: Self-pay | Admitting: Internal Medicine

## 2019-04-15 MED ORDER — PREDNISONE 10 MG PO TABS: 5.0000 mg | ORAL_TABLET | Freq: Every day | ORAL | 2 refills | Status: DC

## 2019-04-15 NOTE — Telephone Encounter (Signed)
Called patient to verify if she has been getting the 10mg  tablets and she said yes she cuts them in half. I also scheduled her follow up per last OV with TP for 06/29/19 at 1000 with Dr. Melvyn Novas. I told her I would give her a refill to get to that office visit and remind them she needs refills. Refill sent.  Nothing further needed at this tims.

## 2019-04-22 ENCOUNTER — Encounter: Payer: Self-pay | Admitting: Family Medicine

## 2019-04-22 ENCOUNTER — Ambulatory Visit
Admission: RE | Admit: 2019-04-22 | Payer: PRIVATE HEALTH INSURANCE | Source: Ambulatory Visit | Admitting: Radiation Oncology

## 2019-04-22 ENCOUNTER — Other Ambulatory Visit: Payer: Self-pay

## 2019-04-22 ENCOUNTER — Ambulatory Visit (INDEPENDENT_AMBULATORY_CARE_PROVIDER_SITE_OTHER): Payer: PRIVATE HEALTH INSURANCE | Admitting: Family Medicine

## 2019-04-22 ENCOUNTER — Ambulatory Visit: Payer: Self-pay

## 2019-04-22 VITALS — BP 167/87 | HR 97 | Temp 100.6°F

## 2019-04-22 DIAGNOSIS — R1110 Vomiting, unspecified: Secondary | ICD-10-CM | POA: Diagnosis not present

## 2019-04-22 DIAGNOSIS — R059 Cough, unspecified: Secondary | ICD-10-CM

## 2019-04-22 DIAGNOSIS — R05 Cough: Secondary | ICD-10-CM | POA: Diagnosis not present

## 2019-04-22 DIAGNOSIS — R509 Fever, unspecified: Secondary | ICD-10-CM

## 2019-04-22 MED ORDER — BENZONATATE 100 MG PO CAPS: 100.0000 mg | ORAL_CAPSULE | Freq: Three times a day (TID) | ORAL | 0 refills | Status: DC | PRN

## 2019-04-22 NOTE — Telephone Encounter (Signed)
Patient scheduled to see Dr. Maudie Mercury at [facility] for virtual viist

## 2019-04-22 NOTE — Progress Notes (Signed)
Virtual Visit via Video Note  I connected with Shauntelle  on 04/22/19 at  4:00 PM EDT by a video enabled telemedicine application and verified that I am speaking with the correct person using two identifiers.  Location patient: home Location provider:work or home office Persons participating in the virtual visit:  Patient, daughter, son, provider   I discussed the limitations of evaluation and management by telemedicine and the availability of in person appointments. The patient expressed understanding and agreed to proceed.   HPI:  Acute visit for fever and cough: -started:started all of a sudden this morning -symptoms: chills, emesis x1, cough - nonproductive, tired -tolerating oral intake, reports normal BM today -denies: diarrhea, SOB, CP, pain, stomach ache, sore throat, HA, neck pain, body aches, rash, tick bites, outdoor activities,  -PMH HTN, Sarcoidosis on low dose steroids prednisone, dulera, albuterol prn BP 167/87 -hx of cancer, currently not on any treatments  -lives with daughter whom does work outside the home and whom has been around [employer]udlogger whom works in a nursing home, no other exposures -no definite COVID19 exposure  ROS: See pertinent positives and negatives per HPI.  Past Medical History:  Diagnosis Date  . Bifid uvula   . GERD (gastroesophageal reflux disease)   . History of cervical dysplasia   . History of chemotherapy    06/ 2005  . History of cleft lip    CORRECTED  . History of condyloma acuminatum    PERIANAL  . History of radiation therapy    completed 06/ 2005  . 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 History of vulvar dysplasia   . Hypertension   . 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 Pulmonary nodule, left   . 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 Upper airway cough syndrome   . Wears glasses     Past Surgical History:  Procedure Laterality Date  . BRONCHOSCOPY  01/22/2008   w/ Lavage and bronchial bx  . CARDIOVASCULAR STRESS TEST  06/27/2006   normal nuclear study w/ no ischemia/  normal LV function and wall motion , ef 61%  . CARPAL TUNNEL RELEASE Right 01/17/2016   and finger trigger release  . CLEFT LIP REPAIR    . CO2 LASER ABLATION VULVA    . EUA/  EXCISIONAL MULTIPLE PERINEAL/ANAL BX'S/  SIGMOIDSCOPY/  PORT-A-CATH PLACEMENT  03/27/2004  . LAPAROSCOPIC CHOLECYSTECTOMY  08/14/2008  . MULTIPLE ANAL BX'S  10/10/2006  . RELEASE A1 PULLEY THUMB Bilateral left 12-17-2006/  right 09-05-2009  . REMOVAL PORT-A-CATH/  EUA/  MULTIPLE ANAL BX'S  10/16/2004  . ROBOTIC ASSISTED TOTAL HYSTERECTOMY WITH BILATERAL SALPINGO OOPHERECTOMY Bilateral 06/25/2018   Procedure: XI ROBOTIC ASSISTED TOTAL HYSTERECTOMY WITH BILATERAL SALPINGO OOPHORECTOMY;  Surgeon: Everitt Amber, MD;  Location: [facility];  Service: Gynecology;  Laterality: Bilateral;  . SENTINEL NODE BIOPSY N/A 06/25/2018   Procedure: SENTINEL NODE BIOPSY;  Surgeon: Everitt Amber, MD;  Location: [facility];  Service: Gynecology;  Laterality: N/A;  . TENNIS ELBOW RELEASE/NIRSCHEL PROCEDURE Right 11/28/2016   Procedure: RIGHT ELBOW ULNER NERVE RELEASE AND OR TRANSPOSITION;  Surgeon: Iran Planas, MD;  Location: [place];  Service: Orthopedics;  Laterality: Right;    Family History  Problem Relation Age of  Onset  . Allergies Mother   . Asthma Mother   . Lung cancer Mother   . Allergies Sister   . Allergies Brother   . Asthma Brother   . Asthma Sister   . Sarcoidosis Neg Hx     SOCIAL HX: see hpi   Current Outpatient Medications:  .  albuterol (PROVENTIL HFA;VENTOLIN HFA) 108 (90 Base) MCG/ACT inhaler, Inhale 2 puffs into the lungs every 4 (four) hours as needed for wheezing or shortness of breath., Disp: 1  Inhaler, Rfl: 5 .  bisacodyl (DULCOLAX) 10 MG suppository, Place 1 suppository (10 mg total) rectally as needed for moderate constipation., Disp: 12 suppository, Rfl: 0 .  bisoprolol (ZEBETA) 10 MG tablet, One twice daily, Disp: 180 tablet, Rfl: 1 .  dextromethorphan (DELSYM) 30 MG/5ML liquid, Take by mouth as needed for cough. , Disp: , Rfl:  .  famotidine (PEPCID) 10 MG tablet, Take 20 mg by mouth at bedtime., Disp: , Rfl:  .  hydrochlorothiazide (HYDRODIURIL) 25 MG tablet, Take 25 mg by mouth daily., Disp: , Rfl:  .  loratadine (CLARITIN) 10 MG tablet, Take 10 mg by mouth daily as needed for allergies., Disp: , Rfl:  .  mometasone-formoterol (DULERA) 100-5 MCG/ACT AERO, Inhale 2 puffs into the lungs 2 (two) times daily., Disp: 1 Inhaler, Rfl: 5 .  mometasone-formoterol (DULERA) 100-5 MCG/ACT AERO, Inhale 2 puffs into the lungs 2 (two) times daily., Disp: 1 Inhaler, Rfl: 0 .  predniSONE (DELTASONE) 10 MG tablet, Take 0.5 tablets (5 mg total) by mouth daily with breakfast., Disp: 30 tablet, Rfl: 2 .  RABEprazole (ACIPHEX) 20 MG tablet, Take 20 mg by mouth daily before breakfast., Disp: , Rfl:  .  simvastatin (ZOCOR) 20 MG tablet, Take 20 mg by mouth daily., Disp: , Rfl:  .  Triamcinolone Acetonide (TRIAMCINOLONE 0.1 % CREAM : EUCERIN) CREA, Apply 1 application topically daily as needed for rash or itching. (Patient taking differently: Apply 1 application topically daily. ), Disp: 500 each, Rfl: 0  EXAM:  VITALS per patient if applicable: Vitals:   16/10/96 1525  BP: (!) 167/87  Pulse: 97  Temp: (!) 100.6 F (38.1 C)     GENERAL: alert, oriented, appears well hydrated and in no acute distress  HEENT: atraumatic, conjunttiva clear, no obvious abnormalities on inspection of external nose and ears, no rhinorrhea, mild erythema of post oropharynx, no exudate, moist mucus membranes  NECK: normal movements of the head and neck, no meningeal signs  LUNGS: on inspection no signs of  respiratory distress, breathing rate appears normal, no obvious gross SOB, gasping or wheezing  CV: no obvious cyanosis  MS: moves all visible extremities without noticeable abnormality  PSYCH/NEURO: pleasant and cooperative, no obvious depression or anxiety, speech and thought processing grossly intact  ASSESSMENT AND PLAN:  Discussed the following assessment and plan:  Fever, unspecified fever cause  Cough  Vomiting, intractability of vomiting not specified, presence of nausea not specified, unspecified vomiting type  -we discussed possible serious and likely etiologies, workup and treatment, treatment risks and return precautions - discussed potential for viral illness, COVID19 (though no definitive exposure), resp infection -after this discussion, Tamitha opted for COVID19 testing and number provided to her an her daughter to call for community testing, advise CXR and they agree if worsening - will schedule close follow up with PCP tomorrow for recheck and will need to be sent for CXR if symptoms continue, tylenol 500-1000mg  q 8 hours prn, tessalon - sent to pharmacy -follow  up advised with PCP tomorrow for recheck -of course, we advised Ranessa  to return or notify a doctor immediately if symptoms worsen or persist or new concerns arise. Advised to go to [facility] or ER immediately if any difficulty breathing, worsening, dizzy, severe symptoms, etc.   I discussed the assessment and treatment plan with the patient. The patient was provided an opportunity to ask questions and all were answered. The patient agreed with the plan and demonstrated an understanding of the instructions.   The patient was advised to call back or seek an in-person evaluation if the symptoms worsen or if the condition fails to improve as anticipated.   Lucretia Kern, DO

## 2019-04-22 NOTE — Telephone Encounter (Addendum)
Patient's sister called and asked if the patient can be called by a nurse for a temperature of 102.  Patient called and says this morning when she woke up, she had chills and a little cough. She says she checked her temperature about 15 minutes before calling and it was 102. She says she took Tylenol at that time. She denies any other symptoms, denies travel, denies exposure to anyone with COVID-19 or travel. I called the office and spoke to [place], RN who asked to speak to the patient, the call was connected successfully to [place].  Answer Assessment - Initial Assessment Questions 1. TEMPERATURE: "What is the most recent temperature?"  "How was it measured?"      102 2. ONSET: "When did the fever start?"      15 minutes ago 3. SYMPTOMS: "Do you have any other symptoms besides the fever?"  (e.g., colds, headache, sore throat, earache, cough, rash, diarrhea, vomiting, abdominal pain)     Little cough started this morning with the fever 4. CAUSE: If there are no symptoms, ask: "What do you think is causing the fever?"      I don't know 5. CONTACTS: "Does anyone else in the family have an infection?"     No 6. TREATMENT: "What have you done so far to treat this fever?" (e.g., medications)     Tylenol 7. IMMUNOCOMPROMISE: "Do you have of the following: diabetes, HIV positive, splenectomy, cancer chemotherapy, chronic steroid treatment, transplant patient, etc."     Steroid treatment-prednisone 8. PREGNANCY: "Is there any chance you are pregnant?" "When was your last menstrual period?"     No 9. TRAVEL: "Have you traveled out of the country in the last month?" (e.g., travel history, exposures)     No  Protocols used: FEVER-A-AH

## 2019-04-23 ENCOUNTER — Encounter: Payer: Self-pay | Admitting: Family Medicine

## 2019-04-23 ENCOUNTER — Ambulatory Visit (INDEPENDENT_AMBULATORY_CARE_PROVIDER_SITE_OTHER): Payer: PRIVATE HEALTH INSURANCE | Admitting: Family Medicine

## 2019-04-23 ENCOUNTER — Other Ambulatory Visit: Payer: Self-pay

## 2019-04-23 VITALS — BP 148/7 | HR 68 | Resp 12

## 2019-04-23 DIAGNOSIS — R062 Wheezing: Secondary | ICD-10-CM

## 2019-04-23 DIAGNOSIS — K529 Noninfective gastroenteritis and colitis, unspecified: Secondary | ICD-10-CM | POA: Diagnosis not present

## 2019-04-23 DIAGNOSIS — I10 Essential (primary) hypertension: Secondary | ICD-10-CM

## 2019-04-23 NOTE — Progress Notes (Signed)
Virtual Visit via Video Note   I connected with [name] on 04/23/19 at  2:30 PM EDT by a video enabled telemedicine application and verified that I am speaking with the correct person using two identifiers.  Location patient: home Location provider:work office Persons participating in the virtual visit: patient, provider  I discussed the limitations of evaluation and management by telemedicine and the availability of in person appointments. She expressed understanding and agreed to proceed.   HPI:  Today she is following on recent visit, 04/22/2019, when she was reporting fever associated with GI symptoms. This morning she had Coronavirus test done.  According to patient, results will be back next week, she was given a phone number to call. She is reporting that fever resolved, this morning temperature was 98.8 F. Took Tylenol last night at 9 pm.  Today she has not had nausea or vomiting, she tolerated breakfast and lunch. She has had diarrhea x3, negative for blood or mucus.  She denies headache, body aches, chest pain, dyspnea, abdominal pain, or urinary symptoms.  States that yesterday she had a "little cough",she denies wheezing and SOB.Her daughter states that she "always wheezes." She follows with Dr. Melvyn Novas due to asthma and sarcoidosis. Currently she is on Dulera 2 puffs twice daily and albuterol as needed. She is also on prednisone 5 mg daily.  Negative for nasal congestion,rhinorreha,or post nasal drainage.  BP has been elevated for the past few days,she usually does not check BP.  Hypertension currently on HCTZ 25 mg daily at bisoprolol 10 mg daily. Amlodipine was discontinued by Dr. Melvyn Novas, who also increased bisoprolol from 10 mg daily to 10 mg twice daily, patient was not aware. She has not noted visual changes,decreased urine output,focal deficit,or edema.   Lab Results  Component Value Date   CREATININE 0.84 02/19/2019   BUN 14 02/19/2019   NA 136 02/19/2019    K 4.0 02/19/2019   CL 97 02/19/2019   CO2 30 02/19/2019    ROS: See pertinent positives and negatives per HPI. COVID-19 screening questions: She is not aware of exposure to COVID-19. Negative for loss in the sense of smell or taste.   Past Medical History:  Diagnosis Date  . Bifid uvula   . GERD (gastroesophageal reflux disease)   . History of cervical dysplasia   . History of chemotherapy    06/ 2005  . History of cleft lip    CORRECTED  . History of condyloma acuminatum    PERIANAL  . History of radiation therapy    completed 06/ 2005  . 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 History of vulvar dysplasia   . Hypertension   . 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 Pulmonary nodule, left   . 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 Sarcoid uveitis of left eye goes to [facility]--  currently no issues per pt 11-27-2016  . Upper airway cough syndrome   . Wears glasses     Past Surgical History:  Procedure Laterality Date  . BRONCHOSCOPY  01/22/2008   w/ Lavage and bronchial bx  . CARDIOVASCULAR STRESS TEST  06/27/2006   normal nuclear study w/ no ischemia/  normal LV function and wall motion , ef 61%  . CARPAL TUNNEL RELEASE Right 01/17/2016   and finger trigger release  . CLEFT  LIP REPAIR    . CO2 LASER ABLATION VULVA    . EUA/  EXCISIONAL MULTIPLE PERINEAL/ANAL BX'S/  SIGMOIDSCOPY/  PORT-A-CATH PLACEMENT  03/27/2004  . LAPAROSCOPIC CHOLECYSTECTOMY  08/14/2008  . MULTIPLE ANAL BX'S  10/10/2006  . RELEASE A1 PULLEY THUMB Bilateral left 12-17-2006/  right 09-05-2009  . REMOVAL PORT-A-CATH/  EUA/  MULTIPLE ANAL BX'S  10/16/2004  . ROBOTIC ASSISTED TOTAL HYSTERECTOMY WITH BILATERAL SALPINGO OOPHERECTOMY Bilateral 06/25/2018   Procedure: XI ROBOTIC ASSISTED TOTAL HYSTERECTOMY WITH BILATERAL SALPINGO OOPHORECTOMY;  Surgeon:  Everitt Amber, MD;  Location: [facility];  Service: Gynecology;  Laterality: Bilateral;  . SENTINEL NODE BIOPSY N/A 06/25/2018   Procedure: SENTINEL NODE BIOPSY;  Surgeon: Everitt Amber, MD;  Location: [facility];  Service: Gynecology;  Laterality: N/A;  . TENNIS ELBOW RELEASE/NIRSCHEL PROCEDURE Right 11/28/2016   Procedure: RIGHT ELBOW ULNER NERVE RELEASE AND OR TRANSPOSITION;  Surgeon: Iran Planas, MD;  Location: [facility];  Service: Orthopedics;  Laterality: Right;    Family History  Problem Relation Age of Onset  . Allergies Mother   . Asthma Mother   . Lung cancer Mother   . Allergies Sister   . Allergies Brother   . Asthma Brother   . Asthma Sister   . Sarcoidosis Neg Hx     Social History   Socioeconomic History  . Marital status: Single    Spouse name: Not on file  . Number of children: Not on file  . Years of education: Not on file  . Highest education level: Not on file  Occupational History  . Occupation: Tech 1    Employer: [employer]  . Financial resource strain: Not on file  . Food insecurity:    Worry: Not on file    Inability: Not on file  . Transportation needs:    Medical: Not on file    Non-medical: Not on file  Tobacco Use  . Smoking status: Former Smoker    Packs/day: 0.50    Years: 1.00    Pack years: 0.50    Types: Cigarettes    Last attempt to quit: 12/16/1993    Years since quitting: 25.3  . Smokeless tobacco: Never Used  Substance and Sexual Activity  . Alcohol use: Yes    Comment: occasional  . Drug use: No  . Sexual activity: Never  Lifestyle  . Physical activity:    Days per week: Not on file    Minutes per session: Not on file  . Stress: Not on file  Relationships  . Social connections:    Talks on phone: Not on file    Gets together: Not on file    Attends religious service: Not on file    Active member of club or organization: Not on file    Attends meetings of clubs or organizations: Not on file     Relationship status: Not on file  . Intimate partner violence:    Fear of current or ex partner: Not on file    Emotionally abused: Not on file    Physically abused: Not on file    Forced sexual activity: Not on file  Other Topics Concern  . Not on file  Social History Narrative  . Not on file      Current Outpatient Medications:  .  albuterol (PROVENTIL HFA;VENTOLIN HFA) 108 (90 Base) MCG/ACT inhaler, Inhale 2 puffs into the lungs every 4 (four) hours as needed for wheezing or shortness of breath., Disp: 1 Inhaler, Rfl:  5 .  benzonatate (TESSALON PERLES) 100 MG capsule, Take 1 capsule (100 mg total) by mouth 3 (three) times daily as needed., Disp: 20 capsule, Rfl: 0 .  bisacodyl (DULCOLAX) 10 MG suppository, Place 1 suppository (10 mg total) rectally as needed for moderate constipation., Disp: 12 suppository, Rfl: 0 .  bisoprolol (ZEBETA) 10 MG tablet, One twice daily, Disp: 180 tablet, Rfl: 1 .  dextromethorphan (DELSYM) 30 MG/5ML liquid, Take by mouth as needed for cough. , Disp: , Rfl:  .  famotidine (PEPCID) 10 MG tablet, Take 20 mg by mouth at bedtime., Disp: , Rfl:  .  hydrochlorothiazide (HYDRODIURIL) 25 MG tablet, Take 25 mg by mouth daily., Disp: , Rfl:  .  loratadine (CLARITIN) 10 MG tablet, Take 10 mg by mouth daily as needed for allergies., Disp: , Rfl:  .  mometasone-formoterol (DULERA) 100-5 MCG/ACT AERO, Inhale 2 puffs into the lungs 2 (two) times daily., Disp: 1 Inhaler, Rfl: 5 .  mometasone-formoterol (DULERA) 100-5 MCG/ACT AERO, Inhale 2 puffs into the lungs 2 (two) times daily., Disp: 1 Inhaler, Rfl: 0 .  predniSONE (DELTASONE) 10 MG tablet, Take 0.5 tablets (5 mg total) by mouth daily with breakfast., Disp: 30 tablet, Rfl: 2 .  RABEprazole (ACIPHEX) 20 MG tablet, Take 20 mg by mouth daily before breakfast., Disp: , Rfl:  .  simvastatin (ZOCOR) 20 MG tablet, Take 20 mg by mouth daily., Disp: , Rfl:  .  Triamcinolone Acetonide (TRIAMCINOLONE 0.1 % CREAM : EUCERIN) CREA,  Apply 1 application topically daily as needed for rash or itching. (Patient taking differently: Apply 1 application topically daily. ), Disp: 500 each, Rfl: 0  EXAM:  VITALS per patient if applicable:BP (!) 193/7   Pulse 68   Resp 12   LMP 12/17/2003   GENERAL: alert, oriented, appears well and in no acute distress  HEENT: atraumatic, normocephalic,conjunctiva clear,and moist oral mucosa.  NECK: normal movements of the head and neck  LUNGS: on inspection no signs of respiratory distress, breathing rate appears normal, no obvious gross SOB, gasping or wheezing  CV: no obvious cyanosis  Veronica: moves all visible extremities without noticeable abnormality  PSYCH/NEURO: pleasant and cooperative, no obvious depression or anxiety, speech and thought processing grossly intact  ASSESSMENT AND PLAN:  Discussed the following assessment and plan:  Acute gastroenteritis Symptoms improving. Continue monitoring temp daily. Oral hydration, clear fluids small and frequent sips. Clearly instructed about warning signs. Pending results for COVID 19 test. F/U as needed.  Essential hypertension BP is not adequately controlled. Possible complications of elevated BP discussed. Increase Bisoprolol from 10 mg from daily to bid. No changes in HCTZ. Continue low salt diet. Monitor BP a few times per week as well as HR. F/U with BP readings in 4-6 weeks.  Wheezing According to daughter problem is stable. No changes in current management. Continue following with Dr Melvyn Novas.     I discussed the assessment and treatment plan with the patient. She was provided an opportunity to ask questions and all were answered. The patient agreed with the plan and demonstrated an understanding of the instructions.   The patient was advised to call back or seek an in-person evaluation if the symptoms worsen or if the condition fails to improve as anticipated.  Return if symptoms worsen or fail to  improve.    Opie Maclaughlin [name], MD

## 2019-04-23 NOTE — Assessment & Plan Note (Signed)
BP is not adequately controlled. Possible complications of elevated BP discussed. Increase Bisoprolol from 10 mg from daily to bid. No changes in HCTZ. Continue low salt diet. Monitor BP a few times per week as well as HR. F/U with BP readings in 4-6 weeks.

## 2019-04-23 NOTE — Assessment & Plan Note (Signed)
According to daughter problem is stable. No changes in current management. Continue following with Dr Melvyn Novas.

## 2019-05-04 ENCOUNTER — Telehealth: Payer: Self-pay | Admitting: Family Medicine

## 2019-05-04 NOTE — Telephone Encounter (Unsigned)
Copied from [place] [id number]. Topic: General - Other >> May 04, 2019  4:10 PM Oneta Rack wrote: Relation to pt: self  Call back number:[phone]   Reason for call:  Patient states Dr. Maudie Mercury was the ordering [facility] for COVID orders placed at [facility] site (chart doesn't reflect). [facility] contacted patient today and informed patient results are negative.PCP advised patient to inform her when results were received.

## 2019-05-05 NOTE — Telephone Encounter (Signed)
Noted. FYI sent to Dr. Jordan for review. 

## 2019-06-22 ENCOUNTER — Ambulatory Visit: Payer: PRIVATE HEALTH INSURANCE | Admitting: Internal Medicine

## 2019-06-24 DIAGNOSIS — H1031 Unspecified acute conjunctivitis, right eye: Secondary | ICD-10-CM | POA: Diagnosis not present

## 2019-06-29 ENCOUNTER — Encounter: Payer: Self-pay | Admitting: Internal Medicine

## 2019-06-29 ENCOUNTER — Other Ambulatory Visit: Payer: Self-pay

## 2019-06-29 ENCOUNTER — Ambulatory Visit (INDEPENDENT_AMBULATORY_CARE_PROVIDER_SITE_OTHER): Payer: Medicare HMO | Admitting: Internal Medicine

## 2019-06-29 DIAGNOSIS — D869 Sarcoidosis, unspecified: Secondary | ICD-10-CM | POA: Diagnosis not present

## 2019-06-29 DIAGNOSIS — R05 Cough: Secondary | ICD-10-CM | POA: Diagnosis not present

## 2019-06-29 DIAGNOSIS — R058 Other specified cough: Secondary | ICD-10-CM

## 2019-06-29 MED ORDER — BUDESONIDE-FORMOTEROL FUMARATE 80-4.5 MCG/ACT IN AERO: 2.0000 | INHALATION_SPRAY | Freq: Two times a day (BID) | RESPIRATORY_TRACT | 11 refills | Status: DC

## 2019-06-29 MED ORDER — BUDESONIDE-FORMOTEROL FUMARATE 80-4.5 MCG/ACT IN AERO: 2.0000 | INHALATION_SPRAY | Freq: Two times a day (BID) | RESPIRATORY_TRACT | 0 refills | Status: DC

## 2019-06-29 NOTE — Progress Notes (Signed)
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   Last worked to [facility] surgery Nov 28 2017      History of Present Illness  Last ov 02/17/13 no evidence active sarcoid rec Aciphex 20 mg Take 30-60 min before first meal of the day also when coughing for any reason take Pepcid 20 mg one at bedtime  Take delsym two tsp every 12 hours and supplement if needed with  tramadol 50 mg up to 2 every 4 hours to suppress the urge to cough. Swallowing water or using ice chips/non mint and menthol containing candies (such as lifesavers or sugarless jolly ranchers) are also effective.  You should rest your voice and avoid activities that you know make you cough. Once you have eliminated the cough for 3 straight days try reducing the tramadol first,  then the delsym as tolerated.   GERD diet    01/28/2014  Pulmonary consultation / / on no maint resp rx / rare baseline need for saba  Chief Complaint  Patient presents with  . Pulmonary Consult    Referred per Dr. Posey Pronto. [name] Cough is prod with moderate yellow sputum. She is using proair approx tid.  Onset was Jan 01 2014 while on aciphex with bfast with flu like illness but not follow contingency rx  On prednisone taper/ sp tamiflu/cough meds  Not limited by breathing from desired activities   rec Aciphex 20 mg Take 30-60 min before first meal of the day also when coughing for any reason take Pepcid 20 mg one at bedtime and chlortrimeton 4 mg at  bedtime Take delsym two tsp every 12 hours and supplement if needed with  tramadol 50 mg up to 2 every 4 hours to suppress the urge to cough.   Once you have eliminated the cough for 3 straight days try reducing the tramadol first,  then the delsym as tolerated.   GERD diet         05/07/2017 acute extended ov/ re: cough > sob  Chief Complaint  Patient presents with  . Acute Visit    Cough not improving, non prod "feels like something in my throat". She is waking up in the night coughing.   was doing fine for months since last ov with me but not doing well as well since 02/18/17 when placed on lisinopril and gradually  downhill since on multple courses of prednisone and now symbicort (though very poor hfa) and much worse dry cough 24/7 no longer has med calendar and doesn't even recognize the copy we have / very confused with meds  rec Stop lotensin and start valasartan 325 mg one daily  Start amlodipine 10 mg daily  Work on inhaler technique:  albuterol as a rescue medication to be used if you can't catch your breath For cough use the flutter valve and Take delsym two tsp every 12 hours and supplement if needed with  tramadol 50 mg up to 2 every 4 hours to suppress the urge to cough  Finish your prednisone Follow the med calendar daily and bring it back with you to all office visits   Surgery 09/23/17 [name] / [facility]:  - ANGIOKERATOMA.  10/21/2017  f/u ov/ re:  Sarcoid/ steroid dep / ocular involvmemnt  Chief Complaint  Patient presents with  . Follow-up    Increased cough x 2 wks- non prod. Her breathing is unchanged. She ran out of prednisone approx 2 wks ago and never had it refilled.   confused re details of care  ? 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 Sarcoid on pred 5 mg daily p no better on 20 mg daily  Chief Complaint  Patient presents with  . Follow-up    PFT's done today. Her breathing is some better since the last visit. She states she has good days and bad days. She is using her albuterol inhaler 3 x daily on average.   Dyspnea:  On days when use Proair,  Does gxt ? Doesn't  Know speed/ slt tilted x 3 min use when not using inhaler/ walks on track x 5 min slow pace Cough: none Sleeping: 30 degrees hob SABA use:  Usually not needing at night but this am she felt needed saba  prior to pfts 02: no rec Start dulera 100 Take 2 puffs first thing in am and then another 2 puffs about 12 hours later.  Work on inhaler technique:   Only use your albuterol (proair) as a rescue  Please schedule a follow up office visit in 2  weeks, sooner if needed  with all medications /inhalers/ solutions in hand    01/25/2019  f/u ov/ re: sarcoid / on pred 5mg  daily / maint on dulera 100 2bid though counts are off (has 40 left, should be 12 )/ hbp with leg swelling on amlodipine  Chief Complaint  Patient presents with  . Follow-up    Pt c/o nasal congestion and HA x 2 days.   Dyspnea:  X 50 ft and can't do steps  Cough: daytime dry/ sporadic Sleeping: 30 degrees electric bed SABA use: much less  02: none  rec Dulera 100 Take 2 puffs first thing in am and then another 2 puffs about 12 hours later.  Work on inhaler technique:  Only use your albuterol as a rescue medication Gabapentin 100 mg three times a day  Change your appt insteady of me see [name] w/in 4  weeks with all your medications Add : advised to d/c amlodipine altogether    06/29/2019  f/u ov/ re: sarcoid with prob airways involvement/ no med calendar, confused with instructions, pharmacy keeps sending her meds she thinks  we stopped  maint on dulera 100 and pred 5 mg daily  Chief Complaint  Patient presents with  . Follow-up    Breathing is unchanged. She has not used her rescue inhaler.   Dyspnea:  Walks one  block then oob half way back and has to stop but this is better than prev Cough: none  Sleeping: ok at 30  deg SABA use: none  02: none   No obvious day to day or daytime variability or assoc excess/ purulent sputum or mucus plugs or hemoptysis or cp or chest tightness, subjective wheeze or overt sinus or hb symptoms.   Sleeping as above without nocturnal  or early am exacerbation  of respiratory  c/o's or need for noct saba. Also denies any obvious fluctuation of symptoms with weather or environmental changes or other aggravating or alleviating factors except as outlined above   No unusual exposure hx or h/o childhood pna/ asthma or knowledge of premature birth.  Current Allergies, Complete Past Medical History, Past Surgical History, Family History, and Social History were reviewed in [employer] record.  ROS  The following are not active complaints unless bolded Hoarseness, sore throat, dysphagia, dental problems, itching, sneezing,  nasal congestion or discharge of excess mucus or purulent secretions, ear ache,   fever, chills, sweats, unintended wt loss or wt gain, classically pleuritic or exertional cp,  orthopnea pnd or arm/hand swelling  or leg swelling, presyncope, palpitations, abdominal pain, anorexia, nausea, vomiting, diarrhea  or change in bowel habits or change in bladder habits, change in stools or change in urine, dysuria, hematuria,  rash, arthralgias, visual complaints, headache, numbness, weakness or ataxia or problems with walking or coordination,  change in mood or  memory.        Current Meds  Medication Sig  . albuterol (PROVENTIL HFA;VENTOLIN HFA) 108 (90 Base) MCG/ACT inhaler Inhale 2 puffs into the lungs every 4 (four) hours as needed for wheezing or  shortness of breath.  . bisoprolol (ZEBETA) 10 MG tablet One twice daily  . Cyanocobalamin (B-12) 5000 MCG CAPS Take by mouth daily.  Marland Kitchen dextromethorphan (DELSYM) 30 MG/5ML liquid Take by mouth as needed for cough.   . hydrochlorothiazide (HYDRODIURIL) 25 MG tablet Take 25 mg by mouth daily.  Marland Kitchen loratadine (CLARITIN) 10 MG tablet Take 10 mg by mouth daily as needed for allergies.  . mometasone-formoterol (DULERA) 100-5 MCG/ACT AERO Inhale 2 puffs into the lungs 2 (two) times daily.  . pantoprazole (PROTONIX) 40 MG tablet Take 40 mg by mouth daily.  . predniSONE (DELTASONE) 10 MG tablet Take 0.5 tablets (5 mg total) by mouth daily with breakfast.  . RABEprazole (ACIPHEX) 20 MG tablet Take 20 mg by mouth daily before breakfast.  . trimethoprim-polymyxin b (POLYTRIM) ophthalmic solution 1 drop into right eye 4 x per day  . vitamin C (ASCORBIC ACID) 500 MG tablet Take 500 mg by mouth daily.  . [DISCONTINUED] bisacodyl (DULCOLAX) 10 MG suppository Place 1 suppository (10 mg total) rectally as needed for moderate constipation.             Objective:   Physical Exam    Obese bf nad    06/29/2019      231 01/25/2019      228   01/14/2019      229  10/23/2018      229  09/17/2018      227  06/16/2018        225  10/21/2017      224  07/07/2017      223  05/22/2017         219  05/07/2017       222  08/22/2016         216   05/17/2016        215   05/02/15 219 lb (99.338 kg)  04/07/15 219 lb 9.6 oz (  99.61 kg)  03/24/15 216 lb (97.977 kg)      Vital signs reviewed - Note on arrival 02 sats  96% on ra       HEENT: nl dentition, turbinates bilaterally, and oropharynx. Nl external ear canals without cough reflex   NECK :  without JVD/Nodes/TM/ nl carotid upstrokes bilaterally   LUNGS: no acc muscle use,  Nl contour chest which is clear to A and P bilaterally without cough on insp or exp maneuvers   CV:  RRR  no s3 or murmur or increase in P2, and no edema   ABD:  Obese but soft and nontender  with nl inspiratory excursion in the supine position. No bruits or organomegaly appreciated, bowel sounds nl  MS:  Nl gait/ ext warm without deformities, calf tenderness, cyanosis or clubbing No obvious joint restrictions   SKIN: warm and dry without lesions    NEURO:  alert, approp, nl sensorium with  no motor or cerebellar deficits apparent.              Assessment & Plan:

## 2019-06-29 NOTE — Patient Instructions (Addendum)
If cough gets worse, add pantoprazole 40mg  take one 30-60 min before supper  dulera 100 = symbicort 80    See calendar for specific medication instructions and bring it back for each and every office visit for every healthcare provider you see.  Without it,  you may not receive the best quality medical care that we feel you deserve.  You will note that the calendar groups together  your maintenance  medications that are timed at particular times of the day.  Think of this as your checklist for what your doctor has instructed you to do until your next evaluation to see what benefit  there is  to staying on a consistent group of medications intended to keep you well.  The other group at the bottom is entirely up to you to use as you see fit  for specific symptoms that may arise between visits that require you to treat them on an as needed basis.  Think of this as your action plan or "what if" list.   Separating the top medications from the bottom group is fundamental to providing you adequate care going forward.     Please schedule a follow up visit in 3 months but call sooner if needed

## 2019-06-30 ENCOUNTER — Encounter: Payer: Self-pay | Admitting: Internal Medicine

## 2019-06-30 ENCOUNTER — Telehealth: Payer: Self-pay | Admitting: *Deleted

## 2019-06-30 NOTE — Assessment & Plan Note (Addendum)
- TBBX pos ncg 01/22/08  - Chronic pred rx 2/09> 04/2008  - Restart Pred December 26, 2008 > June 12, 2009 stopped  > restarted 07/11/11>>pt stopped 10/15/11  - PFT's March 23, 2010 VC 1.77 (66%) no obst, ERV 34%, DLCO 53 > 115% corrected - PFT's 09/30/12       VC 2.02 (57%) ratio 78 , erv 13%  DLCO 55 > 118% corrected - Skin bx 05/19/14 bridge of nose > Pos NCG - 05/02/2015  Walked RA x 3 laps @ 185 ft each stopped due to end of study, no sob/ no desat at nl pace/ no cough provoked  - PFT's  08/22/2016 VC  1.81 ratio 82  with DLCO  59/55 % corrects to 88 % for alv volume  - Spirometry 05/07/2017   FVC  1.81  With no obst by fev1/fvc and no significant curvature  - 05/07/2017  After extensive coaching HFA effectiveness =    75% from a baseline of < 25%  - 05/22/2017   Walked RA x one lap @ 185 stopped due to  Sob at fast pace with sats 96% - 09/09/17 placed on floor dose of 5 mg daily and flared late Oct 2018 when ran out of rx  - Spirometry 06/16/2018  FVC  1.82 (62%) s obst  - 06/16/2018   Walked RA x one lap @ 185 stopped due to  Sob, mod fast pace, no desats  - 09/17/2018   Walked RA x one lap @ 185 stopped due to  Sob/ no desat on ? Dose prednisone with ESR 74 so rec pred 20 ub then taper to 5 mg   - 01/14/2019    try dulera 100 2bid x 2 week sample then return in 2 weeks to regroup/ count use > only 20 puffs gone, confused with 2bid instruction > 06/29/2019 some better on duera 100 but can't afford it > changed to symb 80  - 06/29/2019   Walked RA x one lap =  approx 250 ft - stopped due to  Sob avg pace, no desats  Has improved some in terms of doe but hard to know to what extent any of this is related to sarcoid activity as she has maintained on near physiologic doses of prednisone.   The goal with a chronic steroid dependent illness is always arriving at the lowest effective dose that controls the disease/symptoms and not accepting a set "formula" which is based on statistics or guidelines that don't always  take into account patient  variability or the natural hx of the dz in every individual patient, which may well vary over time.  For now therefore I recommend the patient maintain  Floor of 5 mg daily    I had an extended discussion with the patient reviewing all relevant studies completed to date and  lasting 15 to 20 minutes of a 25 minute visit     directly observed portions of ambulatory 02 saturation study/  device teaching both of which extended face to face time for this visit   Each maintenance medication was reviewed in detail including most importantly the difference between maintenance and prns and under what circumstances the prns are to be triggered using an action plan format that is not reflected in the computer generated alphabetically organized AVS but trather by a customized med calendar that reflects the AVS meds with confirmed 100% correlation.   In addition, Please see AVS for unique instructions that I personally wrote and verbalized to  the the pt in detail and then reviewed with pt  by my nurse highlighting any  changes in therapy recommended at today's visit to their plan of care.

## 2019-06-30 NOTE — Telephone Encounter (Signed)
CALLED PATIENT TO ALTER FU APPT. ON 07-01-19 DUE TO DR. KINARD BEING IN THE OR, RESCHEDULED FOR 07-08-19 @ 9 AM, SPOKE WITH PATIENT AND SHE AGREED TO NEW APPT. TIME AND DATE

## 2019-06-30 NOTE — Assessment & Plan Note (Signed)
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 05/02/2015 >>> - flare May 2017 p stopped aciphex > rec restart 05/17/2016  - flare since 02/2017 placed on lisinopril with no airflow obst 05/07/2017 by spirometry > d/c ACEi  Permanently  - improved 07/07/2017 but not on gerd rx > rec resume full gerd rx/ stay off acei  - 06/29/2019  After extensive coaching inhaler device,  effectiveness =    75% short ti but can't afford dulera 100 so change to symb 80    Continues to struggle with meds and now problem is expense of dulera  Advised:  formulary restrictions will be an ongoing challenge for the forseable future and I would be happy to pick an alternative if the pt will first  provide me a list of them -  pt  will need to return here for training for any new device that is required eg dpi vs hfa vs respimat.    In the meantime we can always provide samples so that the patient never runs out of any needed respiratory medications.

## 2019-07-01 ENCOUNTER — Ambulatory Visit: Payer: PRIVATE HEALTH INSURANCE | Admitting: Radiation Oncology

## 2019-07-07 ENCOUNTER — Other Ambulatory Visit: Payer: Self-pay | Admitting: Internal Medicine

## 2019-07-07 MED ORDER — RABEPRAZOLE SODIUM 20 MG PO TBEC: 20.0000 mg | DELAYED_RELEASE_TABLET | Freq: Every day | ORAL | 3 refills | Status: DC

## 2019-07-07 MED ORDER — HYDROCHLOROTHIAZIDE 25 MG PO TABS: 25.0000 mg | ORAL_TABLET | Freq: Every day | ORAL | 3 refills | Status: DC

## 2019-07-07 MED ORDER — BUDESONIDE-FORMOTEROL FUMARATE 80-4.5 MCG/ACT IN AERO: 2.0000 | INHALATION_SPRAY | Freq: Two times a day (BID) | RESPIRATORY_TRACT | 3 refills | Status: DC

## 2019-07-08 ENCOUNTER — Ambulatory Visit
Admission: RE | Admit: 2019-07-08 | Discharge: 2019-07-08 | Disposition: A | Discharge status: Home / Self Care | Payer: Medicare HMO | Source: Ambulatory Visit | Attending: Radiation Oncology | Admitting: Radiation Oncology

## 2019-07-08 ENCOUNTER — Other Ambulatory Visit: Payer: Self-pay

## 2019-07-08 ENCOUNTER — Encounter: Payer: Self-pay | Admitting: Radiation Oncology

## 2019-07-08 VITALS — BP 147/88 | HR 74 | Temp 98.2°F | Resp 20 | Ht 65.5 in | Wt 232.4 lb

## 2019-07-08 DIAGNOSIS — C541 Malignant neoplasm of endometrium: Secondary | ICD-10-CM | POA: Diagnosis not present

## 2019-07-08 DIAGNOSIS — Z08 Encounter for follow-up examination after completed treatment for malignant neoplasm: Secondary | ICD-10-CM | POA: Diagnosis not present

## 2019-07-08 DIAGNOSIS — Z923 Personal history of irradiation: Secondary | ICD-10-CM | POA: Insufficient documentation

## 2019-07-08 DIAGNOSIS — Z8542 Personal history of malignant neoplasm of other parts of uterus: Secondary | ICD-10-CM | POA: Diagnosis not present

## 2019-07-08 DIAGNOSIS — C211 Malignant neoplasm of anal canal: Secondary | ICD-10-CM

## 2019-07-08 NOTE — Progress Notes (Signed)
Pt presents today for f/u with Dr. Sondra Come. Pt denies c/o pain. Pt denies dysuria/hematuria. Pt denies vaginal bleeding/discharge. Pt denies rectal bleeding. Pt reports bowel habits are normal for her. Pt denies abdominal bloating, N/V.   BP (!) 147/88 (BP Location: Right Wrist, Patient Position: Sitting)   Pulse 74   Temp 98.2 F (36.8 C) (Oral)   Resp 20   Ht 5' 5.5" (1.664 m)   Wt 232 lb 6.4 oz (105.4 kg)   LMP 12/17/2003   SpO2 100%   BMI 38.09 kg/m   Wt Readings from Last 3 Encounters:  07/08/19 232 lb 6.4 oz (105.4 kg)  06/29/19 231 lb (104.8 kg)  02/22/19 225 lb 6.4 oz (102.2 kg)   [name], RN BSN

## 2019-07-08 NOTE — Progress Notes (Signed)
Radiation Oncology         [phone] ________________________________  Name: [name] MRN: [id number]  Date: 07/08/2019  DOB: Jul 28, 1956  Follow-Up Visit Note  CC: [name], [name], MD  Everitt Amber, MD    ICD-10-CM   1. 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 Endometrial cancer ([place])  C54.1     Diagnosis:   63 y.o. 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  She was last seen by Dr. Denman George 5 months ago with no evidence of disease.   On review of systems, the patient denies any pain. She denies dysuria or hematuria. She denies vaginal bleeding or discharge. She denies rectal bleeding. She reports bowel habits are normal for her. She denies abdominal bloating, nausea, or vomiting.   ALLERGIES:  is allergic to shellfish allergy and penicillins.  Meds: Current Outpatient Medications  Medication Sig Dispense Refill  . albuterol (PROVENTIL HFA;VENTOLIN HFA) 108 (90 Base) MCG/ACT inhaler Inhale 2 puffs into the lungs every 4 (four) hours as needed for wheezing or shortness of breath. 1 Inhaler 5  . bisoprolol (ZEBETA) 10 MG tablet One twice daily 180 tablet 1  . budesonide-formoterol (SYMBICORT) 80-4.5 MCG/ACT inhaler Inhale 2 puffs into the lungs 2 (two) times daily. 3 Inhaler 3  . Cyanocobalamin (B-12) 5000 MCG CAPS Take by mouth daily.    Marland Kitchen dextromethorphan (DELSYM) 30 MG/5ML liquid Take by mouth as needed for cough.     . hydrochlorothiazide (HYDRODIURIL) 25 MG tablet Take 1 tablet (25 mg total) by mouth daily. 90 tablet 3  . loratadine (CLARITIN) 10 MG tablet Take 10 mg by mouth daily as  needed for allergies.    . pantoprazole (PROTONIX) 40 MG tablet Take 40 mg by mouth daily.    . predniSONE (DELTASONE) 10 MG tablet Take 0.5 tablets (5 mg total) by mouth daily with breakfast. 30 tablet 2  . RABEprazole (ACIPHEX) 20 MG tablet Take 1 tablet (20 mg total) by mouth daily before breakfast. 90 tablet 3  . trimethoprim-polymyxin b (POLYTRIM) ophthalmic solution 1 drop into right eye 4 x per day    . vitamin C (ASCORBIC ACID) 500 MG tablet Take 500 mg by mouth daily.     No current facility-administered medications for this encounter.     Physical Findings: The patient is in no acute distress. Patient is alert and oriented.  height is 5' 5.5" (1.664 m) and weight is 232 lb 6.4 oz (105.4 kg). Her oral temperature is 98.2 F (36.8 C). Her blood pressure is 147/88 (abnormal) and her pulse is 74. Her respiration is 20 and oxygen saturation is 100%.   Lungs are clear to auscultation bilaterally. Heart has regular rate and rhythm. No palpable cervical, supraclavicular, or axillary adenopathy. Abdomen soft, non-tender, normal bowel sounds.  On pelvic examination the external genitalia were unremarkable. A speculum exam was performed. There are no mucosal lesions noted in the vaginal vault. On bimanual examination there were no pelvic masses appreciated.  Lab Findings: Lab Results  Component Value Date   WBC 7.7 09/17/2018   HGB 13.0 09/17/2018  HCT 37.4 09/17/2018   MCV 91.5 09/17/2018   PLT 228.0 09/17/2018    Radiographic Findings: No results found.  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 No evidence of recurrence on clinical exam.   Plan:  She will see Dr. Denman George in 3 months and radiation oncology in 6 months.  ____________________________________  Blair Promise, PhD, MD  This document serves as a record of services personally performed by Gery Pray, MD. It was created on his behalf by  Rae Lips, a trained medical scribe. The creation of this record is based on the scribe's personal observations and the provider's statements to them. This document has been checked and approved by the attending provider.

## 2019-07-08 NOTE — Patient Instructions (Signed)
Coronavirus (COVID-19) Are you at risk?  Are you at risk for the Coronavirus (COVID-19)?  To be considered HIGH RISK for Coronavirus (COVID-19), you have to meet the following criteria:  . Traveled to [place], [place], [place], [place] or [place]; or in the [place] to [place], [place], [place], or [place]; and have fever, cough, and shortness of breath within the last 2 weeks of travel OR . Been in close contact with a person diagnosed with COVID-19 within the last 2 weeks and have fever, cough, and shortness of breath . IF YOU DO NOT MEET THESE CRITERIA, YOU ARE CONSIDERED LOW RISK FOR COVID-19.  What to do if you are HIGH RISK for COVID-19?  . If you are having a medical emergency, call 911. . Seek medical care right away. Before you go to a doctor's office, urgent care or emergency department, call ahead and tell them about your recent travel, contact with someone diagnosed with COVID-19, and your symptoms. You should receive instructions from your physician's office regarding next steps of care.  . When you arrive at healthcare provider, tell the healthcare staff immediately you have returned from visiting [place], [place], [place], [place] or [place]; or traveled in the [place] to [place], [place], [place], or [place]; in the last two weeks or you have been in close contact with a person diagnosed with COVID-19 in the last 2 weeks.   . Tell the health care staff about your symptoms: fever, cough and shortness of breath. . After you have been seen by a medical provider, you will be either: o Tested for (COVID-19) and discharged home on quarantine except to seek medical care if symptoms worsen, and asked to  - Stay home and avoid contact with others until you get your results (4-5 days)  - Avoid travel on public transportation if possible (such as bus, train, or airplane) or o Sent to the Emergency Department by EMS for evaluation, COVID-19 testing, and possible  admission depending on your condition and test results.  What to do if you are LOW RISK for COVID-19?  Reduce your risk of any infection by using the same precautions used for avoiding the common cold or flu:  . Wash your hands often with soap and warm water for at least 20 seconds.  If soap and water are not readily available, use an alcohol-based hand sanitizer with at least 60% alcohol.  . If coughing or sneezing, cover your mouth and nose by coughing or sneezing into the elbow areas of your shirt or coat, into a tissue or into your sleeve (not your hands). . Avoid shaking hands with others and consider head nods or verbal greetings only. . Avoid touching your eyes, nose, or mouth with unwashed hands.  . Avoid close contact with people who are sick. . Avoid places or events with large numbers of people in one location, like concerts or sporting events. . Carefully consider travel plans you have or are making. . If you are planning any travel outside or inside the [place], visit the CDC's Travelers' Health webpage for the latest health notices. . If you have some symptoms but not all symptoms, continue to monitor at home and seek medical attention if your symptoms worsen. . If you are having a medical emergency, call 911.   ADDITIONAL HEALTHCARE OPTIONS FOR PATIENTS  [facility] / e-Visit: [email or web address]         [facility]: [phone]  [facility]: [phone]                   [facility]: [phone]   

## 2019-07-09 ENCOUNTER — Telehealth: Payer: Self-pay | Admitting: *Deleted

## 2019-07-09 ENCOUNTER — Other Ambulatory Visit: Payer: Self-pay | Admitting: Internal Medicine

## 2019-07-09 MED ORDER — ALBUTEROL SULFATE HFA 108 (90 BASE) MCG/ACT IN AERS: 2.0000 | INHALATION_SPRAY | RESPIRATORY_TRACT | 1 refills | Status: DC | PRN

## 2019-07-09 NOTE — Telephone Encounter (Signed)
Called patient to inform of fu appt. with Dr. Denman George on 09-10-19 - arrival time- 2 pm, spoke with patient and she is aware of  this appt, patient declined this appt. due to her daughter having a baby

## 2019-07-09 NOTE — Telephone Encounter (Signed)
Called patient to inform of fu with Dr. Denman George on 09-01-19 - arrival time - 3:15 pm, spoke with patient and she is aware of this appt.

## 2019-07-09 NOTE — Telephone Encounter (Signed)
Changed the patient's appt for radiation, [name] will call the patient

## 2019-07-16 ENCOUNTER — Telehealth: Payer: Self-pay | Admitting: Internal Medicine

## 2019-07-16 NOTE — Telephone Encounter (Signed)
A refill of pt's symbicort was sent to [facility] 07/07/2019.   Dr. Melvyn Novas, please advise if it is okay to refill pt's prednisone and bisoprolol? Thanks!

## 2019-07-16 NOTE — Telephone Encounter (Signed)
Routing to [facility] triage, as this is a [facility] pt.

## 2019-07-16 NOTE — Telephone Encounter (Signed)
Fine to refill both x 6 months

## 2019-07-19 MED ORDER — PREDNISONE 10 MG PO TABS: 5.0000 mg | ORAL_TABLET | Freq: Every day | ORAL | 3 refills | Status: DC

## 2019-07-19 MED ORDER — BISOPROLOL FUMARATE 10 MG PO TABS: ORAL_TABLET | ORAL | 1 refills | Status: DC

## 2019-07-19 NOTE — Telephone Encounter (Signed)
Pt's bisoprolol, prednisone, and symbicort have all been refilled. Nothing further needed.

## 2019-08-06 ENCOUNTER — Telehealth: Payer: Self-pay

## 2019-08-06 NOTE — Telephone Encounter (Signed)
Contacted pt and offered work in appt with Dr. Sondra Come on Monday, 08/09/19 at 1430. Pt accepted appt and knows to present to ED if condition worsens prior to appt. Pt verbalized understanding and agreement. [name], RN BSN

## 2019-08-06 NOTE — Telephone Encounter (Signed)
Pt calling because she is having pain in low abdominal midline. Pt reports having 2 episodes of bright red blood with BM over past two days. Conveyed to pt that Dr. Sondra Come is not in on Fridays. Advised pt that this RN would speak with Gyn Onc nurse navigator to see if pt could see a provider today, otherwise this RN would have pt worked in to see Dr. Sondra Come on Monday. Pt verbalized understanding. [name], RN BSN

## 2019-08-09 ENCOUNTER — Ambulatory Visit
Admission: RE | Admit: 2019-08-09 | Discharge: 2019-08-09 | Disposition: A | Discharge status: Home / Self Care | Payer: Medicare HMO | Source: Ambulatory Visit | Attending: Radiation Oncology | Admitting: Radiation Oncology

## 2019-08-09 ENCOUNTER — Other Ambulatory Visit: Payer: Self-pay

## 2019-08-09 ENCOUNTER — Encounter: Payer: Self-pay | Admitting: Radiation Oncology

## 2019-08-09 VITALS — HR 79 | Temp 98.9°F | Resp 18 | Ht 65.5 in | Wt 233.2 lb

## 2019-08-09 DIAGNOSIS — Z7982 Long term (current) use of aspirin: Secondary | ICD-10-CM | POA: Diagnosis not present

## 2019-08-09 DIAGNOSIS — Z923 Personal history of irradiation: Secondary | ICD-10-CM | POA: Insufficient documentation

## 2019-08-09 DIAGNOSIS — R109 Unspecified abdominal pain: Secondary | ICD-10-CM | POA: Diagnosis not present

## 2019-08-09 DIAGNOSIS — K625 Hemorrhage of anus and rectum: Secondary | ICD-10-CM | POA: Insufficient documentation

## 2019-08-09 DIAGNOSIS — Z79899 Other long term (current) drug therapy: Secondary | ICD-10-CM | POA: Insufficient documentation

## 2019-08-09 DIAGNOSIS — R102 Pelvic and perineal pain: Secondary | ICD-10-CM | POA: Diagnosis not present

## 2019-08-09 DIAGNOSIS — R339 Retention of urine, unspecified: Secondary | ICD-10-CM | POA: Diagnosis not present

## 2019-08-09 DIAGNOSIS — C541 Malignant neoplasm of endometrium: Secondary | ICD-10-CM

## 2019-08-09 DIAGNOSIS — R140 Abdominal distension (gaseous): Secondary | ICD-10-CM | POA: Diagnosis not present

## 2019-08-09 DIAGNOSIS — R197 Diarrhea, unspecified: Secondary | ICD-10-CM | POA: Insufficient documentation

## 2019-08-09 DIAGNOSIS — Z8542 Personal history of malignant neoplasm of other parts of uterus: Secondary | ICD-10-CM | POA: Insufficient documentation

## 2019-08-09 DIAGNOSIS — R3914 Feeling of incomplete bladder emptying: Secondary | ICD-10-CM | POA: Insufficient documentation

## 2019-08-09 DIAGNOSIS — R351 Nocturia: Secondary | ICD-10-CM | POA: Insufficient documentation

## 2019-08-09 DIAGNOSIS — Z08 Encounter for follow-up examination after completed treatment for malignant neoplasm: Secondary | ICD-10-CM | POA: Diagnosis not present

## 2019-08-09 LAB — URINALYSIS, COMPLETE (UACMP) WITH MICROSCOPIC
Bilirubin Urine: NEGATIVE
Glucose, UA: NEGATIVE mg/dL
Hgb urine dipstick: NEGATIVE
Ketones, ur: NEGATIVE mg/dL
Nitrite: NEGATIVE
Protein, ur: 30 mg/dL — AB
Specific Gravity, Urine: 1.025 (ref 1.005–1.030)
WBC, UA: >50 WBC/hpf — ABNORMAL HIGH (ref 0–5)
pH: 6 (ref 5.0–8.0)

## 2019-08-09 LAB — CMP (CANCER CENTER ONLY)
ALT: 28 U/L (ref 0–44)
AST: 25 U/L (ref 15–41)
Albumin: 3.6 g/dL (ref 3.5–5.0)
Alkaline Phosphatase: 116 U/L (ref 38–126)
Anion gap: 9 (ref 5–15)
BUN: 15 mg/dL (ref 6–20)
CO2: 29 mmol/L (ref 22–32)
Calcium: 9.7 mg/dL (ref 8.9–10.3)
Chloride: 105 mmol/L (ref 98–111)
Creatinine: 0.86 mg/dL (ref 0.44–1.00)
GFR, Est AFR Am: >60 mL/min (ref >60)
GFR, Estimated: >60 mL/min (ref >60)
Glucose, Bld: 109 mg/dL — ABNORMAL HIGH (ref 70–99)
Potassium: 3.9 mmol/L (ref 3.5–5.1)
Sodium: 143 mmol/L (ref 135–145)
Total Bilirubin: 0.5 mg/dL (ref 0.3–1.2)
Total Protein: 8.1 g/dL (ref 6.5–8.1)

## 2019-08-09 LAB — CBC WITH DIFFERENTIAL (CANCER CENTER ONLY)
Abs Immature Granulocytes: 0.02 10*3/uL (ref 0.00–0.07)
Basophils Absolute: 0 10*3/uL (ref 0.0–0.1)
Basophils Relative: 0 %
Eosinophils Absolute: 0.4 10*3/uL (ref 0.0–0.5)
Eosinophils Relative: 5 %
HCT: 34 % — ABNORMAL LOW (ref 36.0–46.0)
Hemoglobin: 11.7 g/dL — ABNORMAL LOW (ref 12.0–15.0)
Immature Granulocytes: 0 %
Lymphocytes Relative: 18 %
Lymphs Abs: 1.5 10*3/uL (ref 0.7–4.0)
MCH: 31.9 pg (ref 26.0–34.0)
MCHC: 34.4 g/dL (ref 30.0–36.0)
MCV: 92.6 fL (ref 80.0–100.0)
Monocytes Absolute: 1 10*3/uL (ref 0.1–1.0)
Monocytes Relative: 12 %
Neutro Abs: 5.4 10*3/uL (ref 1.7–7.7)
Neutrophils Relative %: 65 %
Platelet Count: 223 10*3/uL (ref 150–400)
RBC: 3.67 MIL/uL — ABNORMAL LOW (ref 3.87–5.11)
RDW: 14.2 % (ref 11.5–15.5)
WBC Count: 8.3 10*3/uL (ref 4.0–10.5)
nRBC: 0 % (ref 0.0–0.2)

## 2019-08-09 NOTE — Patient Instructions (Signed)
Coronavirus (COVID-19) Are you at risk?  Are you at risk for the Coronavirus (COVID-19)?  To be considered HIGH RISK for Coronavirus (COVID-19), you have to meet the following criteria:  . Traveled to [place], [place], [place], [place] or [place]; or in the [place] to [place], [place], [place], or [place]; and have fever, cough, and shortness of breath within the last 2 weeks of travel OR . Been in close contact with a person diagnosed with COVID-19 within the last 2 weeks and have fever, cough, and shortness of breath . IF YOU DO NOT MEET THESE CRITERIA, YOU ARE CONSIDERED LOW RISK FOR COVID-19.  What to do if you are HIGH RISK for COVID-19?  . If you are having a medical emergency, call 911. . Seek medical care right away. Before you go to a doctor's office, urgent care or emergency department, call ahead and tell them about your recent travel, contact with someone diagnosed with COVID-19, and your symptoms. You should receive instructions from your physician's office regarding next steps of care.  . When you arrive at healthcare provider, tell the healthcare staff immediately you have returned from visiting [place], [place], [place], [place] or [place]; or traveled in the [place] to [place], [place], [place], or [place]; in the last two weeks or you have been in close contact with a person diagnosed with COVID-19 in the last 2 weeks.   . Tell the health care staff about your symptoms: fever, cough and shortness of breath. . After you have been seen by a medical provider, you will be either: o Tested for (COVID-19) and discharged home on quarantine except to seek medical care if symptoms worsen, and asked to  - Stay home and avoid contact with others until you get your results (4-5 days)  - Avoid travel on public transportation if possible (such as bus, train, or airplane) or o Sent to the Emergency Department by EMS for evaluation, COVID-19 testing, and possible  admission depending on your condition and test results.  What to do if you are LOW RISK for COVID-19?  Reduce your risk of any infection by using the same precautions used for avoiding the common cold or flu:  . Wash your hands often with soap and warm water for at least 20 seconds.  If soap and water are not readily available, use an alcohol-based hand sanitizer with at least 60% alcohol.  . If coughing or sneezing, cover your mouth and nose by coughing or sneezing into the elbow areas of your shirt or coat, into a tissue or into your sleeve (not your hands). . Avoid shaking hands with others and consider head nods or verbal greetings only. . Avoid touching your eyes, nose, or mouth with unwashed hands.  . Avoid close contact with people who are sick. . Avoid places or events with large numbers of people in one location, like concerts or sporting events. . Carefully consider travel plans you have or are making. . If you are planning any travel outside or inside the [place], visit the CDC's Travelers' Health webpage for the latest health notices. . If you have some symptoms but not all symptoms, continue to monitor at home and seek medical attention if your symptoms worsen. . If you are having a medical emergency, call 911.   ADDITIONAL HEALTHCARE OPTIONS FOR PATIENTS  [facility] / e-Visit: [email or web address]         [facility]: [phone]  [facility]: [phone]                   [facility]: [phone]   

## 2019-08-09 NOTE — Progress Notes (Signed)
Radiation Oncology         [phone] ________________________________  Name: [name] MRN: [id number]  Date: 08/09/2019  DOB: 06-10-1956  Follow-Up Visit Note  CC: [name], [name], MD  Everitt Amber, MD    ICD-10-CM   1. Endometrial cancer ([place])  C54.1 CMP ([place] only)    CBC with Differential ([place] Only)    Urinalysis, Complete w Microscopic    Urine culture    Diagnosis:   63 y.o. 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 Tammi Klippel)  Narrative:  The patient returns today for evaluation of low abdominal midline pain, as well as several episodes of bright red blood with bowel movements, noted in telephone conversation with our nurse on 08/06/2019. Today, she reports the low pelvic pain as stabbing and sharp, rated 5/10. She also reports the rectal bleeding does not correlate with bowel movements and started around the same time as the pain.  She will occasionally have episodes of rectal bleeding related to her prior pelvic radiation therapy..  There is (here to back  On further review of systems, she also reports feeling of incomplete bladder emptying, nocturia every hour with urgency, and abdominal bloating. She also notes diarrhea associated with not having a gallbladder. She denies vaginal bleeding or discharge.   {routine follow-up.  She was last seen by Dr. Denman George 5 months ago with no evidence of disease.   She was seen in radiation oncology last month and doing well at that time for chronic problems with diarrhea.    ALLERGIES:  is allergic to shellfish allergy and penicillins.  Meds: Current  Outpatient Medications  Medication Sig Dispense Refill  . albuterol (VENTOLIN HFA) 108 (90 Base) MCG/ACT inhaler Inhale 2 puffs into the lungs every 4 (four) hours as needed for wheezing or shortness of breath. 25.5 g 1  . bisoprolol (ZEBETA) 10 MG tablet One twice daily 180 tablet 1  . budesonide-formoterol (SYMBICORT) 80-4.5 MCG/ACT inhaler Inhale 2 puffs into the lungs 2 (two) times daily. 3 Inhaler 3  . Cyanocobalamin (B-12) 5000 MCG CAPS Take by mouth daily.    Marland Kitchen dextromethorphan (DELSYM) 30 MG/5ML liquid Take by mouth as needed for cough.     . hydrochlorothiazide (HYDRODIURIL) 25 MG tablet Take 1 tablet (25 mg total) by mouth daily. 90 tablet 3  . loratadine (CLARITIN) 10 MG tablet Take 10 mg by mouth daily as needed for allergies.    . pantoprazole (PROTONIX) 40 MG tablet Take 40 mg by mouth daily.    . predniSONE (DELTASONE) 10 MG tablet Take 0.5 tablets (5 mg total) by mouth daily with breakfast. 90 tablet 3  . RABEprazole (ACIPHEX) 20 MG tablet Take 1 tablet (20 mg total) by mouth daily before breakfast. 90 tablet 3  . triamcinolone (KENALOG) 0.025 % cream Apply 1 application topically 2 (two) times daily.    . vitamin C (ASCORBIC ACID) 500 MG tablet Take 500 mg by mouth daily.    Marland Kitchen trimethoprim-polymyxin b (POLYTRIM) ophthalmic solution 1 drop into right eye 4 x per day     No current facility-administered medications for this encounter.  Physical Findings: The patient is in no acute distress. Patient is alert and oriented.  height is 5' 5.5" (1.664 m) and weight is 233 lb 4 oz (105.8 kg). Her temporal temperature is 98.9 F (37.2 C). Her pulse is 79. Her respiration is 18 and oxygen saturation is 97%.   Lungs are clear to auscultation bilaterally. Heart has regular rate and rhythm. No palpable cervical, supraclavicular, or axillary adenopathy. Abdomen soft, non-tender, normal bowel sounds.  On pelvic examination the external genitalia were unremarkable. A speculum exam was  performed. There are no mucosal lesions noted in the vaginal vault. On bimanual examination there were no pelvic masses appreciated.  Patient was noted to have some radiation changes along the anus.  A digital exam showed good sphincter tone with a narrowed anal canal but would permit index finger.  No lesions noted along the anal canal with palpation.  Further examination past the anal canal was not possible in light of the discomfort for the patient.  Lab Findings: Lab Results  Component Value Date   WBC 7.7 09/17/2018   HGB 13.0 09/17/2018   HCT 37.4 09/17/2018   MCV 91.5 09/17/2018   PLT 228.0 09/17/2018    Radiographic Findings: No results found.  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 No evidence of recurrence on clinical exam.  Patient symptoms are somewhat concerning for bladder infection.  She will present to the lab for urinalysis culture and sensitivity.  Patient also undergo routine blood work today.  If her symptoms are not explained by UTI then we may need to consider a CT scan of the abdomen and pelvis.  She may need to be seen by gastroenterology for her chronic problems with diarrhea.  Plan:  She will see Dr. Denman George in 2-3 months and radiation oncology in 6 months.  ____________________________________  Blair Promise, PhD, MD  This document serves as a record of services personally performed by Gery Pray, MD. It was created on his behalf by Wilburn Mylar, a trained medical scribe. The creation of this record is based on the scribe's personal observations and the provider's statements to them. This document has been checked and approved by the attending provider.

## 2019-08-09 NOTE — Progress Notes (Signed)
Pt presents today as work in appt with Dr. Sondra Come. Pt with new onset of low pelvic pain, stabbing, sharp pain rated 5/10. Pt with new onset of rectal bleeding. Pt reports that bleeding and pain started approximately the same time. One episode of bleeding filled a gauze. Pt reports that bleeding does not correlate with bowel movements.   Pt reports feeling of incomplete emptying but does not urinate further at that time. Pt reports nocturia every hour with urgency. Pt reports abdominal bloating. Pt denies vaginal bleeding/discharge. Pt denies N/V. Pt reports diarrhea associated with not having a gallbladder.    Pulse 79   Temp 98.9 F (37.2 C) (Temporal)   Resp 18   Ht 5' 5.5" (1.664 m)   Wt 233 lb 4 oz (105.8 kg)   LMP 12/17/2003   SpO2 97%   BMI 38.22 kg/m   Wt Readings from Last 3 Encounters:  08/09/19 233 lb 4 oz (105.8 kg)  07/08/19 232 lb 6.4 oz (105.4 kg)  06/29/19 231 lb (104.8 kg)   [name], RN BSN

## 2019-08-11 ENCOUNTER — Other Ambulatory Visit: Payer: Self-pay | Admitting: Pulmonary Disease

## 2019-08-11 MED ORDER — PANTOPRAZOLE SODIUM 40 MG PO TBEC: 40.0000 mg | DELAYED_RELEASE_TABLET | Freq: Every day | ORAL | 3 refills | Status: DC

## 2019-08-12 ENCOUNTER — Other Ambulatory Visit: Payer: Self-pay | Admitting: Radiation Oncology

## 2019-08-12 LAB — URINE CULTURE: Culture: 70000 — AB

## 2019-08-12 MED ORDER — SULFAMETHOXAZOLE-TRIMETHOPRIM 800-160 MG PO TABS: 1.0000 | ORAL_TABLET | Freq: Two times a day (BID) | ORAL | 0 refills | Status: DC

## 2019-08-16 ENCOUNTER — Telehealth: Payer: Self-pay | Admitting: Internal Medicine

## 2019-08-16 NOTE — Telephone Encounter (Signed)
Spoke with patient. She stated that [facility] had contacted her last week since they found a cheaper alternative for one of her medications. She could not remember the name of the medication but stated that [facility] had faxed over a form last week.   Advised patient that I would call [employer] to see which medication they were referring to, she verbalized understanding.   Spoke with a pharmacy rep. At [facility]. She stated that the only note she could find in the patient's chart was that the Symbicort was possibly not covered. She stated that she would review the patient's chart and re-send the fax.   Nothing further needed at time of call.

## 2019-08-31 ENCOUNTER — Telehealth: Payer: Self-pay | Admitting: Internal Medicine

## 2019-08-31 NOTE — Telephone Encounter (Signed)
error 

## 2019-09-01 ENCOUNTER — Inpatient Hospital Stay: Payer: Medicare HMO | Admitting: Gynecologic Oncology

## 2019-09-06 ENCOUNTER — Telehealth: Payer: Self-pay | Admitting: *Deleted

## 2019-09-06 NOTE — Telephone Encounter (Signed)
Referrals will be placed for mammogram and colonoscopy.  Copied from [place] [id number]. Topic: General - Other >> Sep 03, 2019  2:57 PM Celene Kras A wrote: Reason for CRM: Pt called and is requesting to set up her mammogram, colonoscopy, and possibly an AWV. Please advise.

## 2019-09-10 ENCOUNTER — Ambulatory Visit: Payer: Medicare HMO | Admitting: Gynecologic Oncology

## 2019-09-20 ENCOUNTER — Other Ambulatory Visit: Payer: Self-pay

## 2019-09-20 ENCOUNTER — Ambulatory Visit
Admission: RE | Admit: 2019-09-20 | Discharge: 2019-09-20 | Disposition: A | Discharge status: Home / Self Care | Payer: Medicare HMO | Source: Ambulatory Visit | Attending: Radiation Oncology | Admitting: Radiation Oncology

## 2019-09-20 DIAGNOSIS — Z8542 Personal history of malignant neoplasm of other parts of uterus: Secondary | ICD-10-CM | POA: Insufficient documentation

## 2019-09-20 DIAGNOSIS — R300 Dysuria: Secondary | ICD-10-CM

## 2019-09-20 DIAGNOSIS — Z79899 Other long term (current) drug therapy: Secondary | ICD-10-CM | POA: Diagnosis not present

## 2019-09-20 LAB — URINALYSIS, COMPLETE (UACMP) WITH MICROSCOPIC
Bilirubin Urine: NEGATIVE
Glucose, UA: NEGATIVE mg/dL
Ketones, ur: NEGATIVE mg/dL
Nitrite: POSITIVE — AB
Protein, ur: 100 mg/dL — AB
Specific Gravity, Urine: 1.023 (ref 1.005–1.030)
WBC, UA: >50 WBC/hpf — ABNORMAL HIGH (ref 0–5)
pH: 6 (ref 5.0–8.0)

## 2019-09-20 NOTE — Progress Notes (Signed)
Pt calling to report low abdominal pains similar to those experienced in late August when pt had UTI. Per Dr. Sondra Come, pt to have urine labs today to r/o recurrent UTI. Pt verbalized understanding and agreement. [name], RN BSN

## 2019-09-22 LAB — URINE CULTURE: Culture: >=100000 — AB

## 2019-09-23 ENCOUNTER — Other Ambulatory Visit: Payer: Self-pay | Admitting: Radiation Oncology

## 2019-09-23 MED ORDER — SULFAMETHOXAZOLE-TRIMETHOPRIM 800-160 MG PO TABS: 1.0000 | ORAL_TABLET | Freq: Two times a day (BID) | ORAL | 0 refills | Status: DC

## 2019-09-24 NOTE — Telephone Encounter (Signed)
I have this patient scheduled for her AWV next week.

## 2019-09-24 NOTE — Telephone Encounter (Signed)
Orders will be placed at AWV by Dr. [name]. Nothing further needed at this time.

## 2019-09-29 ENCOUNTER — Ambulatory Visit (INDEPENDENT_AMBULATORY_CARE_PROVIDER_SITE_OTHER): Payer: Medicare HMO | Admitting: Family Medicine

## 2019-09-29 ENCOUNTER — Encounter: Payer: Self-pay | Admitting: Family Medicine

## 2019-09-29 ENCOUNTER — Other Ambulatory Visit: Payer: Self-pay

## 2019-09-29 VITALS — BP 130/86 | HR 62 | Temp 97.8°F | Resp 12 | Ht 65.5 in | Wt 233.2 lb

## 2019-09-29 DIAGNOSIS — Z6838 Body mass index (BMI) 38.0-38.9, adult: Secondary | ICD-10-CM

## 2019-09-29 DIAGNOSIS — Z780 Asymptomatic menopausal state: Secondary | ICD-10-CM | POA: Diagnosis not present

## 2019-09-29 DIAGNOSIS — Z1211 Encounter for screening for malignant neoplasm of colon: Secondary | ICD-10-CM | POA: Diagnosis not present

## 2019-09-29 DIAGNOSIS — Z0000 Encounter for general adult medical examination without abnormal findings: Secondary | ICD-10-CM | POA: Diagnosis not present

## 2019-09-29 DIAGNOSIS — E782 Mixed hyperlipidemia: Secondary | ICD-10-CM

## 2019-09-29 DIAGNOSIS — I10 Essential (primary) hypertension: Secondary | ICD-10-CM | POA: Diagnosis not present

## 2019-09-29 DIAGNOSIS — K2100 Gastro-esophageal reflux disease with esophagitis, without bleeding: Secondary | ICD-10-CM

## 2019-09-29 LAB — LDL CHOLESTEROL, DIRECT: Direct LDL: 169 mg/dL

## 2019-09-29 LAB — LIPID PANEL
Cholesterol: 276 mg/dL — ABNORMAL HIGH (ref 0–200)
HDL: 35.3 mg/dL — ABNORMAL LOW (ref >39.00)
NonHDL: 240.6
Total CHOL/HDL Ratio: 8
Triglycerides: 321 mg/dL — ABNORMAL HIGH (ref 0.0–149.0)
VLDL: 64.2 mg/dL — ABNORMAL HIGH (ref 0.0–40.0)

## 2019-09-29 MED ORDER — DESONIDE 0.05 % EX LOTN: TOPICAL_LOTION | Freq: Two times a day (BID) | CUTANEOUS | 0 refills | Status: AC

## 2019-09-29 NOTE — Patient Instructions (Addendum)
  [name] , Thank you for taking time to come for your Medicare Wellness Visit. I appreciate your ongoing commitment to your health goals. Please review the following plan we discussed and let me know if I can assist you in the future.   These are the goals we discussed: Goals   None     This is a list of the screening recommended for you and due dates:  Health Maintenance  Topic Date Due  . Mammogram  11/07/2016  . Colon Cancer Screening  05/11/2019  . Pap Smear  09/13/2019  . Tetanus Vaccine  07/07/2023  .  Hepatitis C: One time screening is recommended by Center for Disease Control  (CDC) for  adults born from [birth year] through 1963.   Completed  . HIV Screening  Completed  . Flu Shot  Discontinued   Please arrange your mammogram. Given the fact you are following with oncologist and pulmonologist every 3 months, I can see you annually. Continue monitoring your blood pressure. Walking for about 10 to 15 minutes daily.   A few tips:  -As we age balance is not as good as it was, so there is a higher risks for falls. Please remove small rugs and furniture that is "in your way" and could increase the risk of falls. Stretching exercises may help with fall prevention: Yoga and Tai Chi are some examples. Low impact exercise is better, so you are not very achy the next day.  -Sun screen and avoidance of direct sun light recommended. Caution with dehydration, if working outdoors be sure to drink enough fluids.  - Some medications are not safe as we age, increases the risk of side effects and can potentially interact with other medication you are also taken;  including some of over the counter medications. Be sure to let me know when you start a new medication even if it is a dietary/vitamin supplement.   -Healthy diet low in red meet/animal fat and sugar + regular physical activity is recommended.       Screening schedule for the next 5-10 years:  Colonoscopy Glaucoma  screening/eye exam every 1-2 years. DEXA Mammogram for breast cancer screening annually.  Flu vaccine annually.  Diabetes screening   Fall prevention   Advance directives:  Please see a lawyer and/or go to this website to help you with advanced directives and designating a health care power of attorney so that your wishes will be followed should you become too ill to make your own medical decisions.  [email or web address]

## 2019-09-29 NOTE — Progress Notes (Signed)
HPI:   [name] is a 63 y.o. female, who is here today for CPE and AWV.  Last CPE in 06/2015.  Currently she is on abx treatment for UTI. Denies dysuria or increased urinary frequency. Occasional pelvic pain and bloating sensation.  She lives with her daughter and 2 weeks granddaughter. Independent ADL's and IADL's. No falls in the past year and denies depression symptoms.  Colonoscopy: 05/10/09 Mammogram:11/22/14. DEXA: 07/2011.  Immunization History  Administered Date(s) Administered  . Influenza Split 10/01/2011, 09/23/2012  . Influenza Whole 09/15/2010  . Tdap 07/06/2013    Functional Status Survey: Is the patient deaf or have difficulty hearing?: No Does the patient have difficulty seeing, even when wearing glasses/contacts?: No Does the patient have difficulty concentrating, remembering, or making decisions?: No Does the patient have difficulty walking or climbing stairs?: No Does the patient have difficulty dressing or bathing?: No Does the patient have difficulty doing errands alone such as visiting a doctor's office or shopping?: No  Fall Risk  09/29/2019 10/22/2018 07/22/2018 09/12/2016 08/12/2016  Falls in the past year? 0 0 No No No  Number falls in past yr: 0 - - - -  Injury with Fall? 0 - - - -  Follow up Education provided - - - -    Providers she sees regularly: Eye care provider: Dr Nyoka Cowden. Gyn, Dr Denman George, last seen 01/18/19. Oncology, Dr Sondra Come Pulmonologist, Dr Haze Rushing seen 01/2019.   Depression screen PHQ 2/9 09/29/2019  Decreased Interest 0  Down, Depressed, Hopeless 0  PHQ - 2 Score 0  Some recent data might be hidden   Mini-Cog - 09/29/19 2156    Normal clock drawing test?  yes    How many words correct?  3       Hearing Screening   125Hz  250Hz  500Hz  1000Hz  2000Hz  3000Hz  4000Hz  6000Hz  8000Hz   Right ear:           Left ear:             Visual Acuity Screening   Right eye Left eye Both eyes  Without correction:     With  correction: 20/30 20/30 20/25    Chronic disease management  HTN: Currently she is on Bisoprolol 10 mg daily and HCTZ 25 mg daily. BP readings at home are "good."  Denies severe/frequent headache, visual changes, chest pain, dyspnea, palpitation, claudication, focal weakness, or edema.  Lab Results  Component Value Date   CREATININE 0.86 08/09/2019   BUN 15 08/09/2019   NA 143 08/09/2019   K 3.9 08/09/2019   CL 105 08/09/2019   CO2 29 08/09/2019   HLD: She is on non pharmacologic treatment. She tries to follow low fat diet.  Lab Results  Component Value Date   CHOL 266 (H) 06/20/2017   HDL 30.30 (L) 06/20/2017   LDLCALC (H) 01/21/2008    140        Total Cholesterol/HDL:CHD Risk Coronary Heart Disease Risk Table                     Men   Women  1/2 Average Risk   3.4   3.3   LDLDIRECT 144.0 06/20/2017   TRIG (H) 06/20/2017    400.0 Triglyceride is over 400; calculations on Lipids are invalid.   CHOLHDL 9 06/20/2017   Lab Results  Component Value Date   ALT 28 08/09/2019   AST 25 08/09/2019   ALKPHOS 116 08/09/2019   BILITOT 0.5 08/09/2019  Sarcoidosis: On chronic prednisone 5 mg daily. She follows with Dr Melvyn Novas every 6 months.  Endometrial cancer, stage IB grade 1, s/p hysterectomy. She follows with oncologist every 5-6 months.  Obesity: Requesting "diet pills." She is not exercising regularly since 02/2019, she was doing water aerobics. She walks sometimes. She has not been consistent with following a healthful diet.  Requesting refills for Desonide lotion,which was prescribed by her dermatologist for pruritis skin  She has an appt with her dermatologist in 3 months.  GERD: She is on Protonix 40 mg daily. No symptoms at this time.  Review of Systems  Constitutional: Negative for appetite change, fatigue and fever.  HENT: Negative for hearing loss, mouth sores, trouble swallowing and voice change.   Eyes: Negative for pain and visual disturbance.   Respiratory: Negative for cough and wheezing.   Gastrointestinal: Positive for diarrhea (With greasy foods,s/p cholecystectomy). Negative for abdominal pain, nausea and vomiting.       No changes in bowel habits.  Endocrine: Negative for cold intolerance, heat intolerance, polydipsia, polyphagia and polyuria.  Genitourinary: Negative for decreased urine volume, hematuria, vaginal bleeding and vaginal discharge.  Musculoskeletal: Negative for gait problem and myalgias.  Skin: Negative for color change and rash.  Allergic/Immunologic: Positive for environmental allergies.  Neurological: Negative for syncope, facial asymmetry and weakness.  Hematological: Negative for adenopathy. Does not bruise/bleed easily.  Psychiatric/Behavioral: Negative for confusion. The patient is not nervous/anxious.   All other systems reviewed and are negative. Rest see pertinent positives and negatives per HPI.   Current Outpatient Medications on File Prior to Visit  Medication Sig Dispense Refill  . albuterol (VENTOLIN HFA) 108 (90 Base) MCG/ACT inhaler Inhale 2 puffs into the lungs every 4 (four) hours as needed for wheezing or shortness of breath. 25.5 g 1  . bisoprolol (ZEBETA) 10 MG tablet One twice daily 180 tablet 1  . budesonide-formoterol (SYMBICORT) 80-4.5 MCG/ACT inhaler Inhale 2 puffs into the lungs 2 (two) times daily. 3 Inhaler 3  . Cyanocobalamin (B-12) 5000 MCG CAPS Take by mouth daily.    Marland Kitchen dextromethorphan (DELSYM) 30 MG/5ML liquid Take by mouth as needed for cough.     . hydrochlorothiazide (HYDRODIURIL) 25 MG tablet Take 1 tablet (25 mg total) by mouth daily. 90 tablet 3  . loratadine (CLARITIN) 10 MG tablet Take 10 mg by mouth daily as needed for allergies.    . pantoprazole (PROTONIX) 40 MG tablet Take 1 tablet (40 mg total) by mouth daily. 90 tablet 3  . predniSONE (DELTASONE) 10 MG tablet Take 0.5 tablets (5 mg total) by mouth daily with breakfast. 90 tablet 3  . RABEprazole (ACIPHEX) 20 MG  tablet Take 1 tablet (20 mg total) by mouth daily before breakfast. 90 tablet 3  . sulfamethoxazole-trimethoprim (BACTRIM DS) 800-160 MG tablet Take 1 tablet by mouth 2 (two) times daily. 14 tablet 0  . triamcinolone (KENALOG) 0.025 % cream Apply 1 application topically 2 (two) times daily.    . vitamin C (ASCORBIC ACID) 500 MG tablet Take 500 mg by mouth daily.     No current facility-administered medications on file prior to visit.      Past Medical History:  Diagnosis Date  . Bifid uvula   . GERD (gastroesophageal reflux disease)   . History of cervical dysplasia   . History of chemotherapy    06/ 2005  . History of cleft lip    CORRECTED  . History of condyloma acuminatum    PERIANAL  . History  of radiation therapy    completed 06/ 2005  . 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 History of vulvar dysplasia   . Hypertension   . 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 Pulmonary nodule, left   . 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 Upper airway cough syndrome   . Wears glasses    Past Surgical History:  Procedure Laterality Date  . BRONCHOSCOPY  01/22/2008   w/ Lavage and bronchial bx  . CARDIOVASCULAR STRESS TEST  06/27/2006   normal nuclear study w/ no ischemia/  normal LV function and wall motion , ef 61%  . CARPAL TUNNEL RELEASE Right 01/17/2016   and finger trigger release  . CLEFT LIP REPAIR    . CO2 LASER ABLATION VULVA    . EUA/  EXCISIONAL MULTIPLE PERINEAL/ANAL BX'S/  SIGMOIDSCOPY/  PORT-A-CATH PLACEMENT  03/27/2004  . LAPAROSCOPIC CHOLECYSTECTOMY  08/14/2008  . MULTIPLE ANAL BX'S  10/10/2006  . RELEASE A1 PULLEY THUMB Bilateral left 12-17-2006/  right 09-05-2009  . REMOVAL PORT-A-CATH/  EUA/  MULTIPLE ANAL BX'S  10/16/2004   . ROBOTIC ASSISTED TOTAL HYSTERECTOMY WITH BILATERAL SALPINGO OOPHERECTOMY Bilateral 06/25/2018   Procedure: XI ROBOTIC ASSISTED TOTAL HYSTERECTOMY WITH BILATERAL SALPINGO OOPHORECTOMY;  Surgeon: Everitt Amber, MD;  Location: [facility];  Service: Gynecology;  Laterality: Bilateral;  . SENTINEL NODE BIOPSY N/A 06/25/2018   Procedure: SENTINEL NODE BIOPSY;  Surgeon: Everitt Amber, MD;  Location: [facility];  Service: Gynecology;  Laterality: N/A;  . TENNIS ELBOW RELEASE/NIRSCHEL PROCEDURE Right 11/28/2016   Procedure: RIGHT ELBOW ULNER NERVE RELEASE AND OR TRANSPOSITION;  Surgeon: Iran Planas, MD;  Location: [facility];  Service: Orthopedics;  Laterality: Right;    Allergies  Allergen Reactions  . Shellfish Allergy Hives, Itching and Swelling  . Penicillins Hives and Itching    Has patient had a PCN reaction causing immediate rash, facial/tongue/throat swelling, SOB or lightheadedness with hypotension: Yes Has patient had a PCN reaction causing severe rash involving mucus membranes or skin necrosis: Yes Has patient had a PCN reaction that required hospitalization: Yes Has patient had a PCN reaction occurring within the last 10 years: No If all of the above answers are "NO", then may proceed with Cephalosporin use.    Family History  Problem Relation Age of Onset  . Allergies Mother   . Asthma Mother   . Lung cancer Mother   . Allergies Sister   . Allergies Brother   . Asthma Brother   . Asthma Sister   . Sarcoidosis Neg Hx      Social History   Socioeconomic History  . Marital status: Single    Spouse name: Not on file  . Number of children: Not on file  . Years of education: Not on file  . Highest education level: Not on file  Occupational History  . Occupation: Tech 1    Employer: [employer]  . Financial resource strain: Not on file  . Food insecurity    Worry: Not on file    Inability: Not on file  . Transportation needs    Medical: Not on file     Non-medical: Not on file  Tobacco Use  . Smoking status: Former Smoker  Packs/day: 0.50    Years: 1.00    Pack years: 0.50    Types: Cigarettes    Quit date: 12/16/1993    Years since quitting: 25.8  . Smokeless tobacco: Never Used  Substance and Sexual Activity  . Alcohol use: Yes    Comment: occasional  . Drug use: No  . Sexual activity: Never  Lifestyle  . Physical activity    Days per week: Not on file    Minutes per session: Not on file  . Stress: Not on file  Relationships  . Social Herbalist on phone: Not on file    Gets together: Not on file    Attends religious service: Not on file    Active member of club or organization: Not on file    Attends meetings of clubs or organizations: Not on file    Relationship status: Not on file  Other Topics Concern  . Not on file  Social History Narrative  . Not on file    Vitals:   09/29/19 0845  BP: 130/86  Pulse: 62  Resp: 12  Temp: 97.8 F (36.6 C)  SpO2: 96%   Body mass index is 38.22 kg/m. Wt Readings from Last 3 Encounters:  09/29/19 233 lb 4 oz (105.8 kg)  08/09/19 233 lb 4 oz (105.8 kg)  07/08/19 232 lb 6.4 oz (105.4 kg)     Physical Exam  Nursing note and vitals reviewed. Constitutional: She is oriented to person, place, and time. She appears well-developed. No distress.  HENT:  Head: Normocephalic and atraumatic.  Right Ear: Hearing, tympanic membrane, external ear and ear canal normal.  Left Ear: Hearing, tympanic membrane, external ear and ear canal normal.  Mouth/Throat: Uvula is midline, oropharynx is clear and moist and mucous membranes are normal.  Eyes: Pupils are equal, round, and reactive to light. Conjunctivae and EOM are normal.  Neck: No tracheal deviation present.  Cardiovascular: Normal rate and regular rhythm.  No murmur heard. Pulses:      Dorsalis pedis pulses are 2+ on the right side and 2+ on the left side.  Respiratory: Effort normal and breath sounds normal. No  respiratory distress.  GI: Soft. She exhibits no mass. There is no hepatomegaly. There is no abdominal tenderness.  Genitourinary:    Genitourinary Comments: Breast: No masses,skin abnormalities,or nipple discharge.   Musculoskeletal:        General: Edema (Trace pitting LE edema, bilateral.) present.     Comments: No major deformity or signs of synovitis appreciated.  Lymphadenopathy:    She has no cervical adenopathy.    She has no axillary adenopathy.       Right: No supraclavicular adenopathy present.       Left: No supraclavicular adenopathy present.  Neurological: She is alert and oriented to person, place, and time. She has normal strength. No cranial nerve deficit. Coordination and gait normal.  Reflex Scores:      Bicep reflexes are 2+ on the right side and 2+ on the left side.      Patellar reflexes are 2+ on the right side and 2+ on the left side. Skin: Skin is warm. Rash noted.  In between eye brows and nasal labial folds. No tenderness, edema,or local heat.  Psychiatric: She has a normal mood and affect. Cognition and memory are normal.  Well groomed, good eye contact.    ASSESSMENT AND PLAN:  [name] was seen today for annual wellness visit and annual exam.  Diagnoses and all orders for this visit:  Orders Placed This Encounter  Procedures  . DEXAScan  . Lipid panel  . LDL cholesterol, direct  . Ambulatory referral to Gastroenterology   Lab Results  Component Value Date   CHOL 276 (H) 09/29/2019   HDL 35.30 (L) 09/29/2019   LDLCALC (H) 01/21/2008    140        Total Cholesterol/HDL:CHD Risk Coronary Heart Disease Risk Table                     Men   Women  1/2 Average Risk   3.4   3.3   LDLDIRECT 169.0 09/29/2019   TRIG 321.0 (H) 09/29/2019   CHOLHDL 8 09/29/2019     Routine general medical examination at a health care facility We discussed the importance of regular physical activity and healthy diet for prevention of chronic illness and/or  complications. Preventive guidelines reviewed.  Ca++ and vit D supplementation recommended. Next CPE in a year.  The 10-year ASCVD risk score Mikey Bussing DC Brooke Bonito., et al., 2013) is: 12.2%   Values used to calculate the score:     Age: 59 years     Sex: Female     Is Non-Hispanic African American: Yes     Diabetic: No     Tobacco smoker: No     Systolic Blood Pressure: [id number]     Is BP treated: Yes     HDL Cholesterol: 35.3 mg/dL     Total Cholesterol: 276 mg/dL  Essential hypertension BP adequately controlled. No changes in current management. Monitor BP regularly. Because she follows periodically with other providers and BP is checked during visits, I will see her annually.  Gastroesophageal reflux disease with esophagitis without hemorrhage Problem well controlled with Protonix 40 mg daily. GERD precautions also recommended.  Hyperlipidemia, mixed Continue low fat diet for now. Further recommendations will be given according to 10 years CV risk score and FLP results.  -     Lipid panel  Asymptomatic postmenopausal estrogen deficiency -     DEXAScan; Future  Medicare annual wellness visit, subsequent We discussed the importance of staying active, physically and mentally, as well as the benefits of a healthy/balance diet. Low impact exercise that involve stretching and strengthing are ideal. Vaccines :Refused pneumovax and influenza vaccine. We discussed preventive screening for the next 5-10 years, summery of recommendations given in AVS. Colonoscopy is due. Mammogram:She is going to arrange appt. DEXA ordered.  Fall prevention.  Advance directives and end of life discussed, strongly recommend end of life planning and to have a health POA. Documentation packet given.   Colon cancer screening -     Ambulatory referral to Gastroenterology  Morbid obesity due to excess calories ([place]) complicated by hbp We discussed benefits of wt loss as well as adverse effects of  obesity. Consistency with healthy diet and physical activity recommended. I do not recommend pharmacologic treatment at this time.  Other orders -     desonide (DESOWEN) 0.05 % lotion; Apply topically 2 (two) times daily. Continue following with dermatologist -     LDL cholesterol, direct    Return in 1 year (on 09/28/2020) for AWV,CPE.    -[name] was advised to return sooner than planned today if new concerns arise.   [name] G. [name], MD  [facility]. [place] office.

## 2019-09-30 ENCOUNTER — Ambulatory Visit (INDEPENDENT_AMBULATORY_CARE_PROVIDER_SITE_OTHER): Payer: Medicare HMO | Admitting: Internal Medicine

## 2019-09-30 ENCOUNTER — Encounter: Payer: Self-pay | Admitting: Internal Medicine

## 2019-09-30 ENCOUNTER — Ambulatory Visit (INDEPENDENT_AMBULATORY_CARE_PROVIDER_SITE_OTHER): Payer: Medicare HMO

## 2019-09-30 DIAGNOSIS — D869 Sarcoidosis, unspecified: Secondary | ICD-10-CM

## 2019-09-30 DIAGNOSIS — R058 Other specified cough: Secondary | ICD-10-CM

## 2019-09-30 DIAGNOSIS — R05 Cough: Secondary | ICD-10-CM | POA: Diagnosis not present

## 2019-09-30 DIAGNOSIS — J984 Other disorders of lung: Secondary | ICD-10-CM | POA: Diagnosis not present

## 2019-09-30 NOTE — Patient Instructions (Addendum)
Work on inhaler technique:  relax and gently blow all the way out then take a nice smooth deep breath back in, triggering the inhaler at same time you start breathing in.  Hold for up to 5 seconds if you can. Blow out thru nose. Rinse and gargle with water when done   To get the most out of exercise, you need to be continuously aware that you are short of breath, but never out of breath, for 30 minutes daily. As you improve, it will actually be easier for you to do the same amount of exercise  in  30 minutes so always push to the level where you are short of breath.     See calendar for specific medication instructions and bring it back for each and every office visit for every healthcare provider you see.  Without it,  you may not receive the best quality medical care that we feel you deserve.  You will note that the calendar groups together  your maintenance  medications that are timed at particular times of the day.  Think of this as your checklist for what your doctor has instructed you to do until your next evaluation to see what benefit  there is  to staying on a consistent group of medications intended to keep you well.  The other group at the bottom is entirely up to you to use as you see fit  for specific symptoms that may arise between visits that require you to treat them on an as needed basis.  Think of this as your action plan or "what if" list.   Separating the top medications from the bottom group is fundamental to providing you adequate care going forward.     Please remember to go to the  x-ray department  for your tests - we will call you with the results when they are available     Please schedule a follow up visit in 3 months but call sooner if needed

## 2019-09-30 NOTE — Assessment & Plan Note (Signed)
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 05/02/2015 >>> - flare May 2017 p stopped aciphex > rec restart 05/17/2016  - flare since 02/2017 placed on lisinopril with no airflow obst 05/07/2017 by spirometry > d/c ACEi  Permanently  - improved 07/07/2017 but not on gerd rx > rec resume full gerd rx/ stay off acei  - 06/29/2019   can't afford dulera 100 so change to symb 80> cough resolved as of 09/30/2019  - 09/30/2019  After extensive coaching inhaler device,  effectiveness =    50% (short Ti)  Despite poor hfa >>>  All goals of chronic asthma control met including optimal function and elimination of symptoms with minimal need for rescue therapy.  Contingencies discussed in full including contacting this office immediately if not controlling the symptoms using the rule of two's.     >>> continue symb 80 2bid / max gerd rx

## 2019-09-30 NOTE — Assessment & Plan Note (Addendum)
Body mass index is 38.28 kg/m.  -  trending back up  Lab Results  Component Value Date   TSH 3.84 02/19/2019     Contributing to gerd risk/ doe/reviewed the need and the process to achieve and maintain neg calorie balance > defer f/u primary care including intermittently monitoring thyroid status     I had an extended discussion with the patient  reviewing all relevant studies completed to date and  lasting 15 to 20 minutes of a 25 minute visit  which included directly observing ambulatory 02 saturation study documented in a/p section of  today's  office note.  I performed device teaching  using a teach back technique which also  extended face to face time for this visit (see above)   Each maintenance medication was reviewed in detail including most importantly the difference between maintenance and prns and under what circumstances the prns are to be triggered using an action plan format that is not reflected in the computer generated alphabetically organized AVS.     Please see AVS for specific instructions unique to this visit that I personally wrote and verbalized to the the pt in detail and then reviewed with pt  by my nurse highlighting any changes in therapy recommended at today's visit .

## 2019-09-30 NOTE — Assessment & Plan Note (Signed)
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  - Chronic pred rx 2/09> 04/2008  - Restart Pred December 26, 2008 > June 12, 2009 stopped  > restarted 07/11/11>>pt stopped 10/15/11  - PFT's March 23, 2010 VC 1.77 (66%) no obst, ERV 34%, DLCO 53 > 115% corrected - PFT's 09/30/12       VC 2.02 (57%) ratio 78 , erv 13%  DLCO 55 > 118% corrected - Skin bx 05/19/14 bridge of nose > Pos NCG - 05/02/2015  Walked RA x 3 laps @ 185 ft each stopped due to end of study, no sob/ no desat at nl pace/ no cough provoked  - PFT's  08/22/2016 VC  1.81 ratio 82  with DLCO  59/55 % corrects to 88 % for alv volume  - Spirometry 05/07/2017   FVC  1.81  With no obst by fev1/fvc and no significant curvature  - 05/07/2017  After extensive coaching HFA effectiveness =    75% from a baseline of < 25%  - 05/22/2017   Walked RA x one lap @ 185 stopped due to  Sob at fast pace with sats 96% - 09/09/17 placed on floor dose of 5 mg daily and flared late Oct 2018 when ran out of rx  - Spirometry 06/16/2018  FVC  1.82 (62%) s obst  - 06/16/2018   Walked RA x one lap @ 185 stopped due to  Sob, mod fast pace, no desats  - 09/17/2018   Walked RA x one lap @ 185 stopped due to  Sob/ no desat on ? Dose prednisone with ESR 74 so rec pred 20 ub then taper to 5 mg   - 01/14/2019    try dulera 100 2bid x 2 week sample then return in 2 weeks to regroup/ count use > only 20 puffs gone, confused with 2bid instruction > 06/29/2019 some better on duera 100 but can't afford it > changed to symb 80  - 06/29/2019   Walked RA x one lap =  approx 250 ft - stopped due to  Sob avg pace, no desats - 09/30/2019   Walked RA  2 laps @  approx 250f each @ moderate pace  stopped due to fatigue with sats still 91%   Main issue is conditioning at this point - see avs for instructions unique to this ov

## 2019-09-30 NOTE — Progress Notes (Signed)
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   Last worked  Prior to  [facility] surgery Nov 28 2017      History of Present Illness  Last ov 02/17/13 no evidence active sarcoid rec Aciphex 20 mg Take 30-60 min before first meal of the day also when coughing for any reason take Pepcid 20 mg one at bedtime  Take delsym two tsp every 12 hours and supplement if needed with  tramadol 50 mg up to 2 every 4 hours to suppress the urge to cough. Swallowing water or using ice chips/non mint and menthol containing candies (such as lifesavers or sugarless jolly ranchers) are also effective.  You should rest your voice and avoid activities that you know make you cough. Once you have eliminated the cough for 3 straight days try reducing the tramadol first,  then the delsym as tolerated.   GERD diet    01/28/2014  Pulmonary consultation / [facility]lec/ on no maint resp rx / rare baseline need for saba  Chief Complaint  Patient presents with  . Pulmonary Consult    Referred per Dr. Posey Pronto. [name] Cough is prod with moderate yellow sputum. She is using proair approx tid.  Onset was Jan 01 2014 while on aciphex with bfast with flu like illness but not follow contingency rx  On prednisone taper/ sp tamiflu/cough meds  Not limited by breathing from desired activities   rec Aciphex 20 mg Take 30-60 min before first meal of the day also when coughing for any reason take Pepcid 20 mg one at bedtime and chlortrimeton 4  mg at bedtime Take delsym two tsp every 12 hours and supplement if needed with  tramadol 50 mg up to 2 every 4 hours to suppress the urge to cough.   Once you have eliminated the cough for 3 straight days try reducing the tramadol first,  then the delsym as tolerated.   GERD diet         05/07/2017 acute extended ov/Fronnie Urton re: cough > sob  Chief Complaint  Patient presents with  . Acute Visit    Cough not improving, non prod "feels like something in my throat". She is waking up in the night coughing.   was doing fine for months since last ov with me but not doing well as well since 02/18/17 when placed on lisinopril and gradually  downhill since on multple courses of prednisone and now symbicort (though very poor hfa) and much worse dry cough 24/7 no longer has med calendar and doesn't even recognize the copy we have / very confused with meds  rec Stop lotensin and start valasartan 325 mg one daily  Start amlodipine 10 mg daily  Work on inhaler technique:  albuterol as a rescue medication to be used if you can't catch your breath For cough use the flutter valve and Take delsym two tsp every 12 hours and supplement if needed with  tramadol 50 mg up to 2 every 4 hours to suppress the urge  to cough   Finish your prednisone Follow the med calendar daily and bring it back with you to all office visits   Surgery 09/23/17 [name] / [facility]:  - ANGIOKERATOMA.  10/21/2017  f/u ov/Avanell Banwart re:  Sarcoid/ steroid dep / ocular involvmemnt  Chief Complaint  Patient presents with  . Follow-up    Increased cough x 2 wks- non prod. Her breathing is unchanged. She ran out of prednisone approx 2 wks ago and never had it refilled.   confused re details of care  ? 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 Sarcoid on pred 5 mg daily p no better on 20 mg daily  Chief Complaint  Patient presents with  . Follow-up    PFT's done today. Her breathing is some better since the last visit. She states she has good days and bad days. She is using her albuterol inhaler 3 x daily on average.   Dyspnea:  On days when use Proair,  Does gxt ? Doesn't  Know speed/ slt tilted x 3 min use when not using inhaler/ walks on track x 5 min slow pace Cough: none Sleeping: 30 degrees hob SABA use:  Usually not needing at night but this am she felt needed saba  prior to pfts 02: no rec Start dulera 100 Take 2 puffs first thing in am and then another 2 puffs about 12 hours later.  Work on inhaler technique:   Only use your albuterol (proair) as a rescue  Please schedule a follow up office visit in 2  weeks, sooner if needed  with all medications /inhalers/ solutions in hand    01/25/2019  f/u ov/Terissa Haffey re: sarcoid / on pred 5mg  daily / maint on dulera 100 2bid though counts are off (has 40 left, should be 12 )/ hbp with leg swelling on amlodipine  Chief Complaint  Patient presents with  . Follow-up    Pt c/o nasal congestion and HA x 2 days.   Dyspnea:  X 50 ft and can't do steps  Cough: daytime dry/ sporadic Sleeping: 30 degrees electric bed SABA use: much less  02: none  rec Dulera 100 Take 2 puffs first thing in am and then another 2 puffs about 12 hours later.  Work on inhaler technique:  Only use your albuterol as a rescue medication Gabapentin 100 mg three times a day  Change your appt insteady of me see [name] w/in 4  weeks with all your medications Add : advised to d/c amlodipine altogether    06/29/2019  f/u ov/Khaliel Morey re: sarcoid with prob airways involvement/ no med calendar, confused with instructions, pharmacy keeps sending her meds she thinks  we stopped  maint on dulera 100 and pred 5 mg daily  Chief Complaint  Patient presents with  . Follow-up    Breathing is unchanged. She has not used her rescue inhaler.   Dyspnea:  Walks one  block then oob half way back and has to stop but this is better than prev Cough: none  Sleeping: ok at 30 deg SABA use: none  02: none rec If cough gets worse, add pantoprazole 40mg  take one 30-60 min before supper Dulera 100 = symbicort 80  See calendar for specific medication instructions    09/30/2019  f/u ov/Pola Furno re: sarcoid/ ? Airways involvement / symb 80 2bid pred 5 mg Chief Complaint  Patient presents with  . Follow-up  Dyspnea:  Walks end of street and back up hill to house makes sob but not oob  - does not check sats  Cough: none Sleeping: 30 degrees x years SABA use: rarely  02: none    No obvious day to day or daytime variability or assoc excess/ purulent sputum or mucus plugs or hemoptysis or cp or chest tightness, subjective wheeze or overt sinus or hb symptoms.   Sleeping as above  without nocturnal  or early am exacerbation  of respiratory  c/o's or need for noct saba. Also denies any obvious fluctuation of symptoms with weather or environmental changes or other aggravating or alleviating factors except as outlined above   No unusual exposure hx or h/o childhood pna/ asthma or knowledge of premature birth.  Current Allergies, Complete Past Medical History, Past Surgical History, Family History, and Social History were reviewed in [facility] record.  ROS  The following are not active complaints unless bolded Hoarseness, sore throat, dysphagia, dental problems, itching, sneezing,  nasal congestion or discharge of excess mucus or purulent secretions, ear ache,   fever, chills, sweats, unintended wt loss or wt gain, classically pleuritic or exertional cp,  orthopnea pnd or arm/hand swelling  or leg swelling, presyncope, palpitations, abdominal pain,  anorexia, nausea, vomiting, diarrhea  or change in bowel habits or change in bladder habits, change in stools or change in urine, dysuria, hematuria,  rash, arthralgias, visual complaints, headache, numbness, weakness or ataxia or problems with walking or coordination,  change in mood or  memory.        Current Meds  Medication Sig  . albuterol (VENTOLIN HFA) 108 (90 Base) MCG/ACT inhaler Inhale 2 puffs into the lungs every 4 (four) hours as needed for wheezing or shortness of breath.  . bisoprolol (ZEBETA) 10 MG tablet One twice daily  . budesonide-formoterol (SYMBICORT) 80-4.5 MCG/ACT inhaler Inhale 2 puffs into the lungs 2 (two) times daily.  . Cyanocobalamin (B-12) 5000 MCG CAPS Take by mouth daily.  Marland Kitchen desonide (DESOWEN) 0.05 % lotion Apply topically 2 (two) times daily. Continue following with dermatologist  . dextromethorphan (DELSYM) 30 MG/5ML liquid Take by mouth as needed for cough.   . hydrochlorothiazide (HYDRODIURIL) 25 MG tablet Take 1 tablet (25 mg total) by mouth daily.  Marland Kitchen loratadine (CLARITIN) 10 MG tablet Take 10 mg by mouth daily as needed for allergies.  . pantoprazole (PROTONIX) 40 MG tablet Take 1 tablet (40 mg total) by mouth daily.  . predniSONE (DELTASONE) 10 MG tablet Take 0.5 tablets (5 mg total) by mouth daily with breakfast.  . RABEprazole (ACIPHEX) 20 MG tablet Take 1 tablet (20 mg total) by mouth daily before breakfast.  . sulfamethoxazole-trimethoprim (BACTRIM DS) 800-160 MG tablet Take 1 tablet by mouth 2 (two) times daily.  Marland Kitchen triamcinolone (KENALOG) 0.025 % cream Apply 1 application topically 2 (two) times daily.  . vitamin C (ASCORBIC ACID) 500 MG tablet Take 500 mg by mouth daily.             Objective:   Physical Exam    Obese amb  bf nad  09/30/2019    233  06/29/2019      231 01/25/2019      228   01/14/2019      229  10/23/2018      229  09/17/2018      227  06/16/2018        225  10/21/2017      224  07/07/2017      223  05/22/2017         219   05/07/2017       222  08/22/2016         216   05/17/2016        215   05/02/15 219 lb (99.338 kg)  04/07/15 219 lb 9.6 oz (99.61 kg)  03/24/15 216 lb (97.977 kg)     Vital signs reviewed - Note on arrival 02 sats  92% on RA       HEENT : pt wearing mask not removed for exam due to covid -19 concerns.    NECK :  without JVD/Nodes/TM/ nl carotid upstrokes bilaterally   LUNGS: no acc muscle use,  Nl contour chest which is clear to A and P bilaterally without cough on insp or exp maneuvers   CV:  RRR  no s3 or murmur or increase in P2, and no edema   ABD:  soft and nontender with nl inspiratory excursion in the supine position. No bruits or organomegaly appreciated, bowel sounds nl  MS:  Nl gait/ ext warm without deformities, calf tenderness, cyanosis or clubbing No obvious joint restrictions   SKIN: warm and dry without lesions    NEURO:  alert, approp, nl sensorium with  no motor or cerebellar deficits apparent.      CXR PA and Lateral:   09/30/2019 :    I personally reviewed images and agree with radiology impression as follows:   Stable interstitial densities in the bases. No acute abnormality noted.             Assessment & Plan:

## 2019-10-01 ENCOUNTER — Other Ambulatory Visit: Payer: Self-pay | Admitting: *Deleted

## 2019-10-01 MED ORDER — SIMVASTATIN 20 MG PO TABS: 20.0000 mg | ORAL_TABLET | Freq: Every day | ORAL | 3 refills | Status: DC

## 2019-10-01 NOTE — Progress Notes (Signed)
Pt aware of cxr results/recommendations. Nothing further needed.

## 2019-10-04 ENCOUNTER — Inpatient Hospital Stay: Payer: Medicare HMO | Attending: Gynecologic Oncology | Admitting: Gynecologic Oncology

## 2019-10-28 DIAGNOSIS — L218 Other seborrheic dermatitis: Secondary | ICD-10-CM | POA: Diagnosis not present

## 2019-10-29 ENCOUNTER — Encounter: Payer: Self-pay | Admitting: Family Medicine

## 2019-11-26 ENCOUNTER — Other Ambulatory Visit: Payer: Medicare HMO

## 2019-11-27 ENCOUNTER — Other Ambulatory Visit: Payer: Self-pay | Admitting: Internal Medicine

## 2019-11-29 ENCOUNTER — Ambulatory Visit
Admission: RE | Admit: 2019-11-29 | Discharge: 2019-11-29 | Disposition: A | Discharge status: Home / Self Care | Payer: Medicare HMO | Source: Ambulatory Visit | Attending: Family Medicine | Admitting: Family Medicine

## 2019-11-29 ENCOUNTER — Other Ambulatory Visit: Payer: Self-pay

## 2019-11-29 DIAGNOSIS — Z780 Asymptomatic menopausal state: Secondary | ICD-10-CM

## 2019-11-29 NOTE — Telephone Encounter (Signed)
Dr. Melvyn Novas, please advise if you are okay refilling med for pt or if PCP needs to fill it.

## 2019-12-13 ENCOUNTER — Encounter: Payer: Self-pay | Admitting: Nurse Practitioner

## 2019-12-22 ENCOUNTER — Telehealth (INDEPENDENT_AMBULATORY_CARE_PROVIDER_SITE_OTHER): Payer: Medicare HMO | Admitting: Family Medicine

## 2019-12-22 ENCOUNTER — Encounter: Payer: Self-pay | Admitting: Family Medicine

## 2019-12-22 DIAGNOSIS — J069 Acute upper respiratory infection, unspecified: Secondary | ICD-10-CM

## 2019-12-22 DIAGNOSIS — Z20822 Contact with and (suspected) exposure to covid-19: Secondary | ICD-10-CM | POA: Diagnosis not present

## 2019-12-22 NOTE — Progress Notes (Signed)
Virtual Visit via Telephone Note  I connected with [name] on 1/6/21at  3:30 PM EST by telephone and verified that I am speaking with the correct person using two identifiers.   I discussed the limitations, risks, security and privacy concerns of performing an evaluation and management service by telephone and the availability of in person appointments. I also discussed with the patient that there may be a patient responsible charge related to this service. The patient expressed understanding and agreed to proceed.  Location patient: home Location provider: work or home office Participants present for the call: patient, provider Patient did not have a visit in the prior 7 days to address this/these issue(s).   History of Present Illness: [name] is a 64 yo female with Hx of GERD,allergic rhinitis,anal carcinoma,sarcoidosis,and asthma c/o 3 days of respiratory symptoms. She thinks she may have a sinus infection.  Frontal pressure headache, nasal congestion, rhinorrhea, postnasal drainage, sore throat,and productive cough with light greenish sputum. She denies hemoptysis. Negative for fever, chills, visual changes, chest pain, wheezing, dyspnea, abdominal pain, nausea, vomiting, or skin rash. + fatigue. 2 days ago she lost sense of taste and smell. No known sick contacts but she was out of town a few days ago, [place], [place]   She has taken Delsym, which is helping with cough. Also took Tylenol.   Observations/Objective: Patient sounds cheerful and well on the phone. I do not appreciate any SOB. Speech and thought processing are grossly intact. Patient reported vitals:  Assessment and Plan: 1. Suspected COVID-19 virus infection Explained that symptoms is very suggestive for COVID 19 infection. Educated about signs,symptom,possible complications,and treatment. Phone number and web side information given ,so she can arrange COVID 19 testing tomorrow. Quarantine  recommended. Monitor for fever. Clearly instructed about warning signs.   2. URI, acute Symptomatic treatment recommended. Monitor for signs of complications. She does not feel like she needs something stronger for cough since Delsym is helping. Continue Tylenol 500 mg 3-4 times per day if needed. Adequate hydration and rest.  Follow Up Instructions:  Return if symptoms worsen or fail to improve.  I did not refer this patient for an OV in the next 24 hours for this/these issue(s).  I discussed the assessment and treatment plan with the patient. She was provided an opportunity to ask questions and all were answered. She agreed with the plan and demonstrated an understanding of the instructions.    I provided 7-8 minutes of non-face-to-face time during this encounter.   [name], MD

## 2019-12-23 ENCOUNTER — Ambulatory Visit: Payer: Medicare HMO | Attending: Internal Medicine

## 2019-12-23 DIAGNOSIS — Z20822 Contact with and (suspected) exposure to covid-19: Secondary | ICD-10-CM

## 2019-12-25 LAB — NOVEL CORONAVIRUS, NAA: SARS-CoV-2, NAA: DETECTED — AB

## 2019-12-29 ENCOUNTER — Other Ambulatory Visit: Payer: Self-pay | Admitting: Family Medicine

## 2019-12-29 ENCOUNTER — Telehealth: Payer: Self-pay | Admitting: Family Medicine

## 2019-12-29 DIAGNOSIS — R05 Cough: Secondary | ICD-10-CM

## 2019-12-29 DIAGNOSIS — U071 COVID-19: Secondary | ICD-10-CM

## 2019-12-29 DIAGNOSIS — R059 Cough, unspecified: Secondary | ICD-10-CM

## 2019-12-29 MED ORDER — HYDROCODONE-HOMATROPINE 5-1.5 MG/5ML PO SYRP: 5.0000 mL | ORAL_SOLUTION | Freq: Every evening | ORAL | 0 refills | Status: AC | PRN

## 2019-12-29 MED ORDER — BENZONATATE 100 MG PO CAPS: 200.0000 mg | ORAL_CAPSULE | Freq: Two times a day (BID) | ORAL | 0 refills | Status: AC | PRN

## 2019-12-29 NOTE — Telephone Encounter (Signed)
Please advise, pt had virtual visit with you 1/6.

## 2019-12-29 NOTE — Telephone Encounter (Signed)
Message Routed to PCP CMA 

## 2019-12-29 NOTE — Telephone Encounter (Signed)
Copied from [place] [id number]. Topic: General - Other >> Dec 29, 2019  9:32 AM Keene Breath wrote: Reason for CRM: Patient would like a cough medicine sent in for her positive COVID test and the cough.  CB# [phone]

## 2019-12-29 NOTE — Telephone Encounter (Signed)
Sent 2 Rx's, one to take at night if needed (Hycodan) and Benzonatate 1-2 caps tid prn. Adequate hydration. Monitor for worsening symptoms. Thanks, BJ

## 2019-12-29 NOTE — Telephone Encounter (Signed)
I called and spoke with pt. She is aware both Rx's have been sent in and to monitor for any worsening symptoms & to let us know.

## 2019-12-30 ENCOUNTER — Ambulatory Visit: Payer: Medicare HMO | Admitting: Nurse Practitioner

## 2019-12-31 ENCOUNTER — Ambulatory Visit: Payer: Medicare HMO | Admitting: Internal Medicine

## 2020-01-07 ENCOUNTER — Ambulatory Visit (INDEPENDENT_AMBULATORY_CARE_PROVIDER_SITE_OTHER): Payer: Medicare HMO | Admitting: Internal Medicine

## 2020-01-07 ENCOUNTER — Other Ambulatory Visit: Payer: Self-pay

## 2020-01-07 ENCOUNTER — Encounter: Payer: Self-pay | Admitting: Internal Medicine

## 2020-01-07 DIAGNOSIS — R0600 Dyspnea, unspecified: Secondary | ICD-10-CM

## 2020-01-07 DIAGNOSIS — R0609 Other forms of dyspnea: Secondary | ICD-10-CM

## 2020-01-07 DIAGNOSIS — C211 Malignant neoplasm of anal canal: Secondary | ICD-10-CM

## 2020-01-07 DIAGNOSIS — D869 Sarcoidosis, unspecified: Secondary | ICD-10-CM

## 2020-01-07 NOTE — Progress Notes (Signed)
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   Last worked  Prior to  [employer] surgery Nov 28 2017      History of Present Illness  Last ov 02/17/13 no evidence active sarcoid rec Aciphex 20 mg Take 30-60 min before first meal of the day also when coughing for any reason take Pepcid 20 mg one at bedtime  Take delsym two tsp every 12 hours and supplement if needed with  tramadol 50 mg up to 2 every 4 hours to suppress the urge to cough. Swallowing water or using ice chips/non mint and menthol containing candies (such as lifesavers or sugarless jolly ranchers) are also effective.  You should rest your voice and avoid activities that you know make you cough. Once you have eliminated the cough for 3 straight days try reducing the tramadol first,  then the delsym as tolerated.   GERD diet    01/28/2014  Pulmonary consultation / Aviyon Hocevar/ on no maint resp rx / rare baseline need for saba  Chief Complaint  Patient presents with  . Pulmonary Consult    Referred per Dr. Posey Pronto. [name] Cough is prod with moderate yellow sputum. She is using proair approx tid.  Onset was Jan 01 2014 while on aciphex with bfast with flu like illness but not follow contingency rx  On prednisone taper/ sp tamiflu/cough meds  Not limited by breathing from desired activities   rec Aciphex 20 mg Take 30-60 min before first meal of the day also when coughing for any reason take Pepcid 20 mg one at bedtime and chlortrimeton 4  mg at bedtime Take delsym two tsp every 12 hours and supplement if needed with  tramadol 50 mg up to 2 every 4 hours to suppress the urge to cough.   Once you have eliminated the cough for 3 straight days try reducing the tramadol first,  then the delsym as tolerated.   GERD diet         05/07/2017 acute extended ov/Najmo Pardue re: cough > sob  Chief Complaint  Patient presents with  . Acute Visit    Cough not improving, non prod "feels like something in my throat". She is waking up in the night coughing.   was doing fine for months since last ov with me but not doing well as well since 02/18/17 when placed on lisinopril and gradually  downhill since on multple courses of prednisone and now symbicort (though very poor hfa) and much worse dry cough 24/7 no longer has med calendar and doesn't even recognize the copy we have / very confused with meds  rec Stop lotensin and start valasartan 325 mg one daily  Start amlodipine 10 mg daily  Work on inhaler technique:  albuterol as a rescue medication to be used if you can't catch your breath For cough use the flutter valve and Take delsym two tsp every 12 hours and supplement if needed with  tramadol 50 mg up to 2 every 4 hours to suppress the urge  to cough   Finish your prednisone Follow the med calendar daily and bring it back with you to all office visits   Surgery 09/23/17 [name] / [facility]:  - ANGIOKERATOMA.  10/21/2017  f/u ov/Gotham Raden re:  Sarcoid/ steroid dep / ocular involvmemnt  Chief Complaint  Patient presents with  . Follow-up    Increased cough x 2 wks- non prod. Her breathing is unchanged. She ran out of prednisone approx 2 wks ago and never had it refilled.   confused re details of care  ? 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 Sarcoid on pred 5 mg daily p no better on 20 mg daily  Chief Complaint  Patient presents with  . Follow-up    PFT's done today. Her breathing is some better since the last visit. She states she has good days and bad days. She is using her albuterol inhaler 3 x daily on average.   Dyspnea:  On days when use Proair,  Does gxt ? Doesn't  Know speed/ slt tilted x 3 min use when not using inhaler/ walks on track x 5 min slow pace Cough: none Sleeping: 30 degrees hob SABA use:  Usually not needing at night but this am she felt needed saba  prior to pfts 02: no rec Start dulera 100 Take 2 puffs first thing in am and then another 2 puffs about 12 hours later.  Work on inhaler technique:   Only use your albuterol (proair) as a rescue  Please schedule a follow up office visit in 2  weeks, sooner if needed  with all medications /inhalers/ solutions in hand    01/25/2019  f/u ov/Elige Shouse re: sarcoid / on pred 5mg  daily / maint on dulera 100 2bid though counts are off (has 40 left, should be 12 )/ hbp with leg swelling on amlodipine  Chief Complaint  Patient presents with  . Follow-up    Pt c/o nasal congestion and HA x 2 days.   Dyspnea:  X 50 ft and can't do steps  Cough: daytime dry/ sporadic Sleeping: 30 degrees electric bed SABA use: much less  02: none  rec Dulera 100 Take 2 puffs first thing in am and then another 2 puffs about 12 hours later.  Work on inhaler technique:  Only use your albuterol as a rescue medication Gabapentin 100 mg three times a day  Change your appt insteady of me see [name] w/in 4  weeks with all your medications Add : advised to d/c amlodipine altogether    06/29/2019  f/u ov/Magnum Lunde re: sarcoid with prob airways involvement/ no med calendar, confused with instructions, pharmacy keeps sending her meds she thinks  we stopped  maint on dulera 100 and pred 5 mg daily  Chief Complaint  Patient presents with  . Follow-up    Breathing is unchanged. She has not used her rescue inhaler.   Dyspnea:  Walks one  block then oob half way back and has to stop but this is better than prev Cough: none  Sleeping: ok at 30 deg SABA use: none  02: none rec If cough gets worse, add pantoprazole 40mg  take one 30-60 min before supper Dulera 100 = symbicort 80  See calendar for specific medication instructions    09/30/2019  f/u ov/Dallys Nowakowski re: sarcoid/ ? Airways involvement / symb 80 2bid pred 5 mg Chief Complaint  Patient presents with  . Follow-up  Dyspnea:  Walks end of street and back up hill to house makes sob but not oob  - does not check sats  Cough: none Sleeping: 30 degrees x years SABA use: rarely  02: none  rec Follow medication med calendear  Onset of symptoms Jan 3 21  > dx Covid 19 no change pred   Virtual Visit via Telephone Note 01/07/2020   I connected with Eustaquio Maize on 01/07/20 at  8:15  AM EST by telephone and verified that I am speaking with the correct person using two identifiers.   I discussed the limitations, risks, security and privacy concerns of performing an evaluation and management service by telephone and the availability of in person appointments. I also discussed with the patient that there may be a patient responsible charge related to this service. The patient expressed understanding and agreed to proceed.   History of Present Illness: sarcoidosis/ pred 5 mg daily / symb 802bid Dyspnea:  Riding stationery bike x 5 min  Cough: minimal non-productive  Sleeping: occ cough disturbs sleep  SABA use: not needing though confused with names of meds and originally stated used twice daily (which is the rx for symbicort)  02: none    No obvious day to day or daytime variability or assoc excess/ purulent sputum or mucus plugs or hemoptysis or cp or chest tightness, subjective  wheeze or overt sinus or hb symptoms.    Also denies any obvious fluctuation of symptoms with weather or environmental changes or other aggravating or alleviating factors except as outlined above.   Meds reviewed/ med reconciliation completed      Observations/Objective: Sounds fine on phone /   no conversational dyspnea noted, nl phonation    Assessment and Plan: See problem list for active a/p's   Follow Up Instructions: See avs for instructions unique to this ov which includes revised/ updated med list     I discussed the assessment and treatment plan with the patient. The patient was provided an opportunity to ask questions and all were answered. The patient agreed with the plan and demonstrated an understanding of the instructions.   The patient was advised to call back or seek an in-person evaluation if the symptoms worsen or if the condition fails to improve as anticipated.  I provided 25  minutes of non-face-to-face time during this encounter.   Christinia Gully, MD

## 2020-01-07 NOTE — Assessment & Plan Note (Signed)
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  - Chronic pred rx 2/09> 04/2008  - Restart Pred December 26, 2008 > June 12, 2009 stopped  > restarted 07/11/11>>pt stopped 10/15/11  - PFT's March 23, 2010 VC 1.77 (66%) no obst, ERV 34%, DLCO 53 > 115% corrected - PFT's 09/30/12       VC 2.02 (57%) ratio 78 , erv 13%  DLCO 55 > 118% corrected - Skin bx 05/19/14 bridge of nose > Pos NCG - 05/02/2015  Walked RA x 3 laps @ 185 ft each stopped due to end of study, no sob/ no desat at nl pace/ no cough provoked  - PFT's  08/22/2016 VC  1.81 ratio 82  with DLCO  59/55 % corrects to 88 % for alv volume  - Spirometry 05/07/2017   FVC  1.81  With no obst by fev1/fvc and no significant curvature  - 05/07/2017  After extensive coaching HFA effectiveness =    75% from a baseline of < 25%  - 05/22/2017   Walked RA x one lap @ 185 stopped due to  Sob at fast pace with sats 96% - 09/09/17 placed on floor dose of 5 mg daily and flared late Oct 2018 when ran out of rx  - Spirometry 06/16/2018  FVC  1.82 (62%) s obst  - 06/16/2018   Walked RA x one lap @ 185 stopped due to  Sob, mod fast pace, no desats  - 09/17/2018   Walked RA x one lap @ 185 stopped due to  Sob/ no desat on ? Dose prednisone with ESR 74 so rec pred 20 ub then taper to 5 mg   - 01/14/2019    try dulera 100 2bid x 2 week sample then return in 2 weeks to regroup/ count use > only 20 puffs gone, confused with 2bid instruction > 06/29/2019 some better on duera 100 but can't afford it > changed to symb 80  - 06/29/2019   Walked RA x one lap =  approx 250 ft - stopped due to  Sob avg pace, no desats - 09/30/2019   Walked RA  2 laps @  approx 242f each @ moderate pace  stopped due to fatigue with sats still 91%   Came thru covid 19 s resp complications and never changed pred > 5 mg daily   The goal with a chronic steroid dependent illness is always arriving at the lowest effective dose that controls the disease/symptoms and not accepting a set "formula" which is based on statistics or guidelines  that don't always take into account patient  variability or the natural hx of the dz in every individual patient, which may well vary over time.  For now therefore I recommend the patient maintain  5 mg floor and up to 20 mg for any respiratory flares as per med calendar action plan

## 2020-01-07 NOTE — Assessment & Plan Note (Signed)
Not able to work since 11/2017 Worse since  06/2018 pelvic surgery  - 09/17/2018   Walked RA x one lap @ 185 stopped due to  Sob/ no desat/ moderate pace - Spirometry 09/17/2018  FEV1 1.0 (49%)  Ratio 66 s prior  But curve is typical of VCD not asthma - 09/17/2018  After extensive coaching inhaler device,  effectiveness =    75%  - 01/14/2019   Walked RA x one lap =  approx 250 ft - stopped due to sob no desats, slow pace  - PFT's  01/14/2019  FEV1 1.43  (68 % ) ratio 0.80  p 10 % improvement from saba p saba prior to study with DLCO  57 % corrects to 86 % for alv volume  ERV 35 on pred 5 mg  daily  - virtually identical to study done 08/22/16 @ wt =  216 vs 229 01/14/2019   Advised on paced ex of at least 20 min duration and check sats at highest intensity  Each maintenance medication was reviewed in detail including most importantly the difference between maintenance and as needed and under what circumstances the prns are to be used.  Please see AVS for specific  Instructions which are unique to this visit and I personally typed out  which were reviewed in detail over the phone with the patient and a copy provided via Mychart

## 2020-01-07 NOTE — Patient Instructions (Addendum)
Make sure you check your oxygen saturations at highest level of activity to be sure it stays over 90% and call if dropping lower   Please schedule a follow up visit in 6  months but call sooner if needed

## 2020-01-11 ENCOUNTER — Telehealth: Payer: Self-pay | Admitting: Family Medicine

## 2020-01-11 NOTE — Telephone Encounter (Signed)
The patient is requesting a refill on  HYDROcodone-homatropine (HYCODAN) 5-1.5 MG/5ML syrup   for a persistent cough that she still has,  pt is requesting a call back at [phone]; if this could cough med can be refilled.  [facility] [phone] - [address], [place] AT [address]  Phone:  [phone] Fax:  [phone]

## 2020-01-11 NOTE — Telephone Encounter (Signed)
Please advise on refill of cough medication - pt is still having cough.

## 2020-01-12 ENCOUNTER — Encounter: Payer: Self-pay | Admitting: Family Medicine

## 2020-01-12 ENCOUNTER — Telehealth (INDEPENDENT_AMBULATORY_CARE_PROVIDER_SITE_OTHER): Payer: Medicare HMO | Admitting: Family Medicine

## 2020-01-12 DIAGNOSIS — R05 Cough: Secondary | ICD-10-CM

## 2020-01-12 DIAGNOSIS — R059 Cough, unspecified: Secondary | ICD-10-CM

## 2020-01-12 DIAGNOSIS — U071 COVID-19: Secondary | ICD-10-CM | POA: Diagnosis not present

## 2020-01-12 MED ORDER — HYDROCODONE-HOMATROPINE 5-1.5 MG/5ML PO SYRP: 5.0000 mL | ORAL_SOLUTION | Freq: Every evening | ORAL | 0 refills | Status: AC | PRN

## 2020-01-12 NOTE — Progress Notes (Signed)
Virtual Visit via Telephone Note  I connected with [name] on 1/27/21at  4:30 PM EST by telephone and verified that I am speaking with the correct person using two identifiers.   I discussed the limitations, risks, security and privacy concerns of performing an evaluation and management service by telephone and the availability of in person appointments. I also discussed with the patient that there may be a patient responsible charge related to this service. The patient expressed understanding and agreed to proceed.  Location patient: home Location provider: work office Participants present for the call: patient, provider Patient did not have a visit in the prior 7 days to address this/these issue(s).  History of Present Illness: [name] is a 64 yo female with hx of sarcoidosis ,allergies,and GERD who is recovering from COVID 19 infection and c/o persistent cough that is keeping her from sleep. She was seen on 12/22/19,virtual visit. Positive COVID 19, test done on 12/23/19.  She is now able to smell and taste food. Denies fever, chills, sore throat, CP,SOB, wheezing. She was recently evaluated by her pulmonologist.  She is feeling much better but still hs a "lingering" cough at night. She is requesting a refill on Hycodan, it helps her "realx" and sleep better.  She is also taking Benzonatate but it doe snot help much. Tolerated medication well,no side effects.  Observations/Objective: Patient sounds cheerful and well on the phone. I do not appreciate any SOB. Speech and thought processing are grossly intact. Patient reported vitals:N/A  Assessment and Plan: 1. Cough Explained that cough can last a few weeks and even months. She also has history of chronic cough. We discussed some side effects of hydrocodone-homatroprine. Clearly instructed about warning signs.  - HYDROcodone-homatropine (HYCODAN) 5-1.5 MG/5ML syrup; Take 5 mLs by mouth at bedtime as needed for up to 10  days.  Dispense: 50 mL; Refill: 0  2. COVID-19 virus infection Symptoms resolved. I do not think she needs to be re-screened for COVID 19.   Follow Up Instructions:  Return if symptoms worsen or fail to improve.  I did not refer this patient for an OV in the next 24 hours for this/these issue(s).  I discussed the assessment and treatment plan with the patient. [name] was provided an opportunity to ask questions and all were answered. She agreed with the plan and demonstrated an understanding of the instructions.    I provided 7 minutes of non-face-to-face time during this encounter.   Brenna Friesenhahn [name], MD

## 2020-01-12 NOTE — Telephone Encounter (Signed)
Pt advised med wouldn't be refilled - pt wanted televist, scheduled for 4:30 today as cough is not getting better.

## 2020-01-13 ENCOUNTER — Telehealth: Payer: Self-pay | Admitting: *Deleted

## 2020-01-13 ENCOUNTER — Ambulatory Visit: Payer: Medicare HMO | Admitting: Radiation Oncology

## 2020-01-13 NOTE — Telephone Encounter (Signed)
CALLED PATIENT TO INFORM THAT FU APPT. FOR 01-13-20 HAS BEEN MOVED TO 02/10/20 @ 3PM, PATIENT AGREED TO NEW TIME AND DATE

## 2020-01-14 ENCOUNTER — Other Ambulatory Visit (INDEPENDENT_AMBULATORY_CARE_PROVIDER_SITE_OTHER): Payer: Medicare HMO

## 2020-01-14 ENCOUNTER — Encounter: Payer: Self-pay | Admitting: Nurse Practitioner

## 2020-01-14 ENCOUNTER — Ambulatory Visit: Payer: Medicare HMO | Admitting: Nurse Practitioner

## 2020-01-14 VITALS — BP 170/96 | HR 72 | Temp 98.4°F | Ht 65.5 in | Wt 230.0 lb

## 2020-01-14 DIAGNOSIS — Z86010 Personal history of colonic polyps: Secondary | ICD-10-CM | POA: Diagnosis not present

## 2020-01-14 DIAGNOSIS — Z85048 Personal history of other malignant neoplasm of rectum, rectosigmoid junction, and anus: Secondary | ICD-10-CM

## 2020-01-14 DIAGNOSIS — R1030 Lower abdominal pain, unspecified: Secondary | ICD-10-CM | POA: Diagnosis not present

## 2020-01-14 LAB — CBC WITH DIFFERENTIAL/PLATELET
Basophils Absolute: 0 10*3/uL (ref 0.0–0.1)
Basophils Relative: 0.7 % (ref 0.0–3.0)
Eosinophils Absolute: 0.2 10*3/uL (ref 0.0–0.7)
Eosinophils Relative: 3 % (ref 0.0–5.0)
HCT: 34.6 % — ABNORMAL LOW (ref 36.0–46.0)
Hemoglobin: 11.8 g/dL — ABNORMAL LOW (ref 12.0–15.0)
Lymphocytes Relative: 18.1 % (ref 12.0–46.0)
Lymphs Abs: 1.3 10*3/uL (ref 0.7–4.0)
MCHC: 34.2 g/dL (ref 30.0–36.0)
MCV: 91.8 fl (ref 78.0–100.0)
Monocytes Absolute: 0.9 10*3/uL (ref 0.1–1.0)
Monocytes Relative: 12.9 % — ABNORMAL HIGH (ref 3.0–12.0)
Neutro Abs: 4.8 10*3/uL (ref 1.4–7.7)
Neutrophils Relative %: 65.3 % (ref 43.0–77.0)
Platelets: 176 10*3/uL (ref 150.0–400.0)
RBC: 3.77 Mil/uL — ABNORMAL LOW (ref 3.87–5.11)
RDW: 14.9 % (ref 11.5–15.5)
WBC: 7.3 10*3/uL (ref 4.0–10.5)

## 2020-01-14 LAB — C-REACTIVE PROTEIN: CRP: <1 mg/dL (ref 0.5–20.0)

## 2020-01-14 LAB — COMPREHENSIVE METABOLIC PANEL
ALT: 19 U/L (ref 0–35)
AST: 17 U/L (ref 0–37)
Albumin: 4 g/dL (ref 3.5–5.2)
Alkaline Phosphatase: 92 U/L (ref 39–117)
BUN: 13 mg/dL (ref 6–23)
CO2: 31 mEq/L (ref 19–32)
Calcium: 9.3 mg/dL (ref 8.4–10.5)
Chloride: 100 mEq/L (ref 96–112)
Creatinine, Ser: 0.76 mg/dL (ref 0.40–1.20)
GFR: 93.58 mL/min (ref >60.00)
Glucose, Bld: 119 mg/dL — ABNORMAL HIGH (ref 70–99)
Potassium: 3.4 mEq/L — ABNORMAL LOW (ref 3.5–5.1)
Sodium: 138 mEq/L (ref 135–145)
Total Bilirubin: 0.6 mg/dL (ref 0.2–1.2)
Total Protein: 8.1 g/dL (ref 6.0–8.3)

## 2020-01-14 MED ORDER — NA SULFATE-K SULFATE-MG SULF 17.5-3.13-1.6 GM/177ML PO SOLN: 1.0000 | Freq: Once | ORAL | 0 refills | Status: AC

## 2020-01-14 NOTE — Patient Instructions (Addendum)
If you are age 62 or older, your body mass index should be between 23-30. Your Body mass index is 37.69 kg/m. If this is out of the aforementioned range listed, please consider follow up with your Primary Care Provider.  If you are age 60 or younger, your body mass index should be between 19-25. Your Body mass index is 37.69 kg/m. If this is out of the aformentioned range listed, please consider follow up with your Primary Care Provider.   We have sent the following medications to your pharmacy for you to pick up at your convenience:  [facility] provider has requested that you go to the basement level for lab work before leaving today. Press "B" on the elevator. The lab is located at the first door on the left as you exit the elevator.  You have been scheduled for a CT scan of the abdomen and pelvis at [facility] ([address]---this is in the same building as [facility]).   You are scheduled on 01/19/2020 at 10:00am. You should arrive 15 minutes prior to your appointment time for registration. Please follow the written instructions below on the day of your exam:  WARNING: IF YOU ARE ALLERGIC TO IODINE/X-RAY DYE, PLEASE NOTIFY RADIOLOGY IMMEDIATELY AT [phone]! YOU WILL BE GIVEN A 13 HOUR PREMEDICATION PREP.  1) Do not eat or drink anything after 6:00 am (4 hours prior to your test) 2) You have been given 2 bottles of oral contrast to drink. The solution may taste better if refrigerated, but do NOT add ice or any other liquid to this solution. Shake well before drinking.    Drink 1 bottle of contrast @ 8:00 am (2 hours prior to your exam)  Drink 1 bottle of contrast @ 9:00 am (1 hour prior to your exam)  You may take any medications as prescribed with a small amount of water, if necessary. If you take any of the following medications: METFORMIN, GLUCOPHAGE, GLUCOVANCE, AVANDAMET, RIOMET, FORTAMET, [place] MET, JANUMET, GLUMETZA or METAGLIP, you MAY be asked to  HOLD this medication 48 hours AFTER the exam.  The purpose of you drinking the oral contrast is to aid in the visualization of your intestinal tract. The contrast solution may cause some diarrhea. Depending on your individual set of symptoms, you may also receive an intravenous injection of x-ray contrast/dye. Plan on being at [facility] for 30 minutes or longer, depending on the type of exam you are having performed.  This test typically takes 30-45 minutes to complete.  If you have any questions regarding your exam or if you need to reschedule, you may call the CT department at [phone] between the hours of 8:00 am and 5:00 pm, Monday-Friday.  ________________________________________________________________________  Avoid straining/constipation if needed take Miralax at bedtime    Due to recent changes in healthcare laws, you may see the results of your imaging and laboratory studies on MyChart before your provider has had a chance to review them.  We understand that in some cases there may be results that are confusing or concerning to you. Not all laboratory results come back in the same time frame and the provider may be waiting for multiple results in order to interpret others.  Please give [place] 48 hours in order for your provider to thoroughly review all the results before contacting the office for clarification of your results.

## 2020-01-14 NOTE — Progress Notes (Addendum)
01/14/2020 Shaneaka Pulk [id number] 1956-01-13   HISTORY OF PRESENT ILLNESS:  [name] is a 64 year old with a past medical history hypertension, sarcoidosis diagnosed 20 years ago on chronic Prednisone for 5 to 6 years, anal squamous cell cancer 2005 s/p resection with chemo and radiation, endometrial cancer s/p TAH BSO 06/25/2018 and radiation. GERD and colon polyps. She was diagnosed with uncomplicated COVID + 0000000. She presents today for further evaluation regarding lower abdominal pain.  She developed central lower abdominal pain 3 months ago. She saw her oncologist, Dr. Windle Guard and she was found to have a UTI which was treated with an antibiotic. Her central lower abdominal pain resolved. Two months later, she had the same central lower abdominal/pelvic pain and she was prescribed another antibiotic for a presumed UTI by Dr. [name]. Her pain resolved within 1 week of taking the antibiotic. Her central lower abdomina pain recurred 3 weeks ago. No dysuria or hematuria. Her central lower/pelvic pain occurs most days, comes and goes and is worse when she strains to urinate and occasional occurs when she passes a BM or when she coughs. She denies having any constipation but sometimes strains to pass a BM. She is passing a normal solid stool once daily. No rectal bleeding or black stools. No fever, sweats or chills. She reports having a history of colon polyps. Her most recent colonoscopy was done 05/10/2009. She reported having a few colon polyps removed, however, I am not able to access the colonoscopy procedure report at this time to confirm the findings.  No family history of colorectal cancer. History of GERD. She is taking Rabeprazole 20mg  once daily. She denies having any dysphagia or heartburn. No upper abdominal pain. She possibly had an EGD 10+ years ago. Obtaining an accurate past medical history was somewhat challenging.  No other complaints at this time.   Abdominal/pelvic CT  07/20/2018:  1. Surgical changes from recent hysterectomy. No findings suspicious for residual tumor or adenopathy in the pelvis. 2. No CT findings to suggest omental, peritoneal surface or solid organ metastatic disease in the abdomen. 3. Changes consistent with known sarcoidosis in the chest with bulky calcified mediastinal and hilar lymph nodes and interstitial lung disease. I do not see any definite CT findings to suggest metastatic disease involving the chest.   Past Medical History:  Diagnosis Date  . Bifid uvula   . GERD (gastroesophageal reflux disease)   . History of cervical dysplasia   . History of chemotherapy    06/ 2005  . History of cleft lip    CORRECTED  . History of condyloma acuminatum    PERIANAL  . History of radiation therapy    completed 06/ 2005  . 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 History of vulvar dysplasia   . Hypertension   . 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 Pulmonary nodule, left   . 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 Sarcoid uveitis of left eye goes to [facility]--  currently no issues per pt 11-27-2016  . Upper airway cough syndrome   . Wears glasses    Past Surgical History:  Procedure Laterality Date  . BRONCHOSCOPY  01/22/2008   w/ Lavage and bronchial bx  . CARDIOVASCULAR STRESS TEST  06/27/2006   normal nuclear study w/ no ischemia/  normal  LV function and wall motion , ef 61%  . CARPAL TUNNEL RELEASE Right 01/17/2016   and finger trigger release  . CLEFT LIP REPAIR    . CO2 LASER ABLATION VULVA    . EUA/  EXCISIONAL MULTIPLE PERINEAL/ANAL BX'S/  SIGMOIDSCOPY/  PORT-A-CATH PLACEMENT  03/27/2004  . LAPAROSCOPIC CHOLECYSTECTOMY  08/14/2008  . MULTIPLE ANAL BX'S  10/10/2006  . RELEASE A1 PULLEY THUMB Bilateral left 12-17-2006/  right 09-05-2009  . REMOVAL PORT-A-CATH/  EUA/   MULTIPLE ANAL BX'S  10/16/2004  . ROBOTIC ASSISTED TOTAL HYSTERECTOMY WITH BILATERAL SALPINGO OOPHERECTOMY Bilateral 06/25/2018   Procedure: XI ROBOTIC ASSISTED TOTAL HYSTERECTOMY WITH BILATERAL SALPINGO OOPHORECTOMY;  Surgeon: Everitt Amber, MD;  Location: [facility];  Service: Gynecology;  Laterality: Bilateral;  . SENTINEL NODE BIOPSY N/A 06/25/2018   Procedure: SENTINEL NODE BIOPSY;  Surgeon: Everitt Amber, MD;  Location: [facility];  Service: Gynecology;  Laterality: N/A;  . TENNIS ELBOW RELEASE/NIRSCHEL PROCEDURE Right 11/28/2016   Procedure: RIGHT ELBOW ULNER NERVE RELEASE AND OR TRANSPOSITION;  Surgeon: Iran Planas, MD;  Location: [facility];  Service: Orthopedics;  Laterality: Right;    reports that she quit smoking about 26 years ago. Her smoking use included cigarettes. She has a 0.50 pack-year smoking history. She has never used smokeless tobacco. She reports current alcohol use. She reports that she does not use drugs. family history includes Allergies in her brother, mother, and sister; Asthma in her brother, mother, and sister; Lung cancer in her mother. Allergies  Allergen Reactions  . Shellfish Allergy Hives, Itching and Swelling  . Penicillins Hives and Itching    Has patient had a PCN reaction causing immediate rash, facial/tongue/throat swelling, SOB or lightheadedness with hypotension: Yes Has patient had a PCN reaction causing severe rash involving mucus membranes or skin necrosis: Yes Has patient had a PCN reaction that required hospitalization: Yes Has patient had a PCN reaction occurring within the last 10 years: No If all of the above answers are "NO", then may proceed with Cephalosporin use.       Outpatient Encounter Medications as of 01/14/2020  Medication Sig  . albuterol (VENTOLIN HFA) 108 (90 Base) MCG/ACT inhaler Inhale 2 puffs into the lungs every 4 (four) hours as needed for wheezing or shortness of breath.  . bisoprolol (ZEBETA) 10 MG tablet TAKE 1  TABLET TWICE DAILY  . budesonide-formoterol (SYMBICORT) 80-4.5 MCG/ACT inhaler Inhale 2 puffs into the lungs 2 (two) times daily.  Marland Kitchen desonide (DESOWEN) 0.05 % lotion Apply topically 2 (two) times daily. Continue following with dermatologist  . dextromethorphan (DELSYM) 30 MG/5ML liquid Take by mouth as needed for cough.   . hydrochlorothiazide (HYDRODIURIL) 25 MG tablet Take 1 tablet (25 mg total) by mouth daily.  Marland Kitchen HYDROcodone-homatropine (HYCODAN) 5-1.5 MG/5ML syrup Take 5 mLs by mouth at bedtime as needed for up to 10 days.  Marland Kitchen loratadine (CLARITIN) 10 MG tablet Take 10 mg by mouth daily as needed for allergies.  . pantoprazole (PROTONIX) 40 MG tablet Take 1 tablet (40 mg total) by mouth daily.  . predniSONE (DELTASONE) 10 MG tablet Take 0.5 tablets (5 mg total) by mouth daily with breakfast.  . RABEprazole (ACIPHEX) 20 MG tablet Take 1 tablet (20 mg total) by mouth daily before breakfast.  . simvastatin (ZOCOR) 20 MG tablet Take 1 tablet (20 mg total) by mouth at bedtime.  . triamcinolone (KENALOG) 0.025 % cream Apply 1 application topically 2 (two) times daily.  . vitamin C (ASCORBIC ACID) 500 MG tablet  Take 500 mg by mouth daily.  . [DISCONTINUED] Cyanocobalamin (B-12) 5000 MCG CAPS Take by mouth daily.   No facility-administered encounter medications on file as of 01/14/2020.     REVIEW OF SYSTEMS  : All other systems reviewed and negative except where noted in the History of Present Illness.   PHYSICAL EXAM: BP (!) 170/96   Pulse 72   Temp 98.4 F (36.9 C)   Ht 5' 5.5" (1.664 m)   Wt 230 lb (104.3 kg)   LMP 12/17/2003   BMI 37.69 kg/m  General: Well developed 64 year old female in no acute distress. Head: Normocephalic and atraumatic. Eyes:  Sclerae non-icteric, conjunctive pink. Ears: Normal auditory acuity. Neck: Supple, no lymphadenopathy or thyromegaly.  Mouth: No ulcers or lesions. Lungs: Clear bilaterally to auscultation without wheezes, crackles or rhonchi. Heart:  Regular rate and rhythm. No murmur, rub or gallop appreciated.  Abdomen: Soft, mild suprapubic and central lower abdominal tenderness.  Non distended. No masses. No hepatosplenomegaly. Normoactive bowel sounds x 4 quadrants.  Rectal: Deferred.  Musculoskeletal: Symmetrical with no gross deformities. Skin: Warm and dry. No rash or lesions on visible extremities. Extremities: No edema. Neurological: Alert oriented x 4, no focal deficits.  Psychological:  Alert and cooperative. Normal mood and affect.  ASSESSMENT AND PLAN:  75. 64 year old female with central lower abdominal pain -CBC, CMP and CRP -Abdominal/pelvic CT with contrast  -Avoid straining, use Miralax Q HS PRN  2. Questionable history of colon polyps -Request copy of her 05/10/2009 colonoscopy report (patient thought her surgeon who completed her anal surgery completed the colonoscopy?)  3. History of endometrial cancer   4. History of anal squamous cell cancer 2005  5. GERD, stable  6. Sarcoidosis on chronic Prednisone   7. HTN -I advised the patient to follow up with her PCP regarding elevated BP in office today   ADDENDUM: Abdominal/pelvic CT scan 01/19/2020 showed the sigmoid colon and rectum are decompressed although slightly thickened appearing, suggestive of nonspecific infectious, inflammatory, or ischemic colitis.   She will proceed with a colonoscopy as scheduled with Dr. Fuller Plan on 02/22/2020. Colonoscopy benefits and risks including risks with sedation, risk of bleeding, perforation and infection were discussed with the patient at the time of her office visit 01/14/2020. Further follow up to be determined after colonoscopy completed.     CC:  [name], [name], MD

## 2020-01-14 NOTE — Progress Notes (Signed)
Reviewed and agree with management plan.  Gerard Bonus T. Edita Weyenberg, MD FACG [facility]  

## 2020-01-17 ENCOUNTER — Telehealth: Payer: Self-pay | Admitting: General Surgery

## 2020-01-17 NOTE — Telephone Encounter (Signed)
-----   Message from Noralyn Pick, NP sent at 01/14/2020  1:32 PM EST ----- Olivia Mackie, can you contact PCP for a copy of pt's colonoscopy ? 2010, in [place] there is a 04/2009 colonoscopy under procedures but I don't actually see the colonoscopy procedure report. Sorry if this is a duplicate request. Thx

## 2020-01-19 ENCOUNTER — Ambulatory Visit (INDEPENDENT_AMBULATORY_CARE_PROVIDER_SITE_OTHER)
Admission: RE | Admit: 2020-01-19 | Discharge: 2020-01-19 | Disposition: A | Discharge status: Home / Self Care | Payer: Medicare HMO | Source: Ambulatory Visit | Attending: Nurse Practitioner | Admitting: Nurse Practitioner

## 2020-01-19 ENCOUNTER — Other Ambulatory Visit: Payer: Self-pay

## 2020-01-19 DIAGNOSIS — R109 Unspecified abdominal pain: Secondary | ICD-10-CM | POA: Diagnosis not present

## 2020-01-19 DIAGNOSIS — Z85048 Personal history of other malignant neoplasm of rectum, rectosigmoid junction, and anus: Secondary | ICD-10-CM

## 2020-01-19 DIAGNOSIS — R1030 Lower abdominal pain, unspecified: Secondary | ICD-10-CM

## 2020-01-19 DIAGNOSIS — Z86010 Personal history of colonic polyps: Secondary | ICD-10-CM

## 2020-01-19 MED ORDER — IOHEXOL 300 MG/ML  SOLN
100.0000 mL | Freq: Once | INTRAMUSCULAR | Status: AC | PRN
  Administered 2020-01-19: 11:00:00 100 mL via INTRAVENOUS

## 2020-01-24 ENCOUNTER — Telehealth: Payer: Self-pay | Admitting: Family Medicine

## 2020-01-24 ENCOUNTER — Telehealth: Payer: Self-pay | Admitting: General Surgery

## 2020-01-24 NOTE — Telephone Encounter (Signed)
Dr [name] has no report on file, for Gastroenterology in 2010.

## 2020-01-24 NOTE — Telephone Encounter (Signed)
I called and spoke with pt. We went over the information below. Pt had a CT scan last week & had to drink the contrast - her stomach has been messed up since then. Pt will try the Miralax, she has an appointment with [place] on Wednesday morning.

## 2020-01-24 NOTE — Telephone Encounter (Signed)
Contacted [name], she stated last Colon was performed by Dr Rosana Hoes at [place]. She requested we send a records release to her and she will complete it and send it back to [place] for [place] to get the report.

## 2020-01-24 NOTE — Telephone Encounter (Signed)
Please advise 

## 2020-01-24 NOTE — Telephone Encounter (Signed)
Pt is requesting advice on what she can take for constipation.

## 2020-01-24 NOTE — Telephone Encounter (Signed)
-----   Message from Noralyn Pick, NP sent at 01/14/2020  1:32 PM EST ----- Olivia Mackie, can you contact PCP for a copy of pt's colonoscopy ? 2010, in [place] there is a 04/2009 colonoscopy under procedures but I don't actually see the colonoscopy procedure report. Sorry if this is a duplicate request. Thx

## 2020-01-24 NOTE — Telephone Encounter (Signed)
Maintaining adequate fiber and fluid intake. OTC Benefiber 1 teaspoon twice daily may help. MiraLAX daily at bedtime. If there is no improvement with MiraLAX in 1 week, she can add bisacodyl 5 mg at night as needed. Please ask her to follow if problem is persistent after 10-14 days of treatment. Thanks, BJ

## 2020-01-25 ENCOUNTER — Other Ambulatory Visit: Payer: Self-pay

## 2020-01-26 ENCOUNTER — Encounter: Payer: Self-pay | Admitting: Family Medicine

## 2020-01-26 ENCOUNTER — Ambulatory Visit (INDEPENDENT_AMBULATORY_CARE_PROVIDER_SITE_OTHER): Payer: Medicare HMO | Admitting: Family Medicine

## 2020-01-26 ENCOUNTER — Telehealth: Payer: Self-pay | Admitting: Nurse Practitioner

## 2020-01-26 VITALS — BP 136/80 | HR 68 | Resp 12 | Ht 65.5 in | Wt 225.5 lb

## 2020-01-26 DIAGNOSIS — M79605 Pain in left leg: Secondary | ICD-10-CM | POA: Diagnosis not present

## 2020-01-26 DIAGNOSIS — R2242 Localized swelling, mass and lump, left lower limb: Secondary | ICD-10-CM | POA: Diagnosis not present

## 2020-01-26 DIAGNOSIS — A630 Anogenital (venereal) warts: Secondary | ICD-10-CM | POA: Diagnosis not present

## 2020-01-26 DIAGNOSIS — I8392 Asymptomatic varicose veins of left lower extremity: Secondary | ICD-10-CM | POA: Diagnosis not present

## 2020-01-26 MED ORDER — TRIAMCINOLONE ACETONIDE 0.025 % EX CREA: 1.0000 "application " | TOPICAL_CREAM | Freq: Two times a day (BID) | CUTANEOUS | 2 refills | Status: DC

## 2020-01-26 MED ORDER — GABAPENTIN 100 MG PO CAPS: 300.0000 mg | ORAL_CAPSULE | Freq: Every day | ORAL | 0 refills | Status: DC

## 2020-01-26 NOTE — Progress Notes (Signed)
ACUTE VISIT   HPI:  Chief Complaint  Patient presents with  . Leg Pain    left leg, swollen & painful    [name] is a 64 y.o. female, who is here today with above concern.  LLE edema and a tender "knot" in leg. "Knot" has been there for about 3 months and it is more painful. Pain is constant.  She also c/o pain in the whole leg, anterior aspect. Sharp, 8-9/10. Pain wakes her up in the middle of the night. No numbness,tingling but burning sensation. This is a new problem. No hx of trauma.  Exacerbated by standing up and walking and alleviated by rest. She has tried asper cream.  -Worsening bilateral LE edema, unchanged through the day, no edema.  No back pain, LE joint pain, or skin rash. Negative for fever,chills,cough, SOB,CP,palpitations,abdominal pain,saddle anesthesia,or changes in bowel/bladder function.   She is also requesting a refill for Triamcinolone cream , which she applies on perianal lesion that very pruritic. It helps with pruritus. Lesion was excised a couple years ago and grew back. Intermittent bleeding and pain.  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   Review of Systems  Constitutional: Negative for activity change, appetite change and fatigue.  HENT: Negative for mouth sores, nosebleeds and sore throat.   Respiratory: Negative for chest tightness and wheezing.   Gastrointestinal: Negative for abdominal pain, nausea and vomiting.       Negative for changes in bowel habits.  Genitourinary: Negative for decreased urine volume, dysuria and hematuria.  Musculoskeletal: Negative for gait problem.  Skin: Positive for wound.  Neurological: Negative for syncope, weakness and headaches.  Rest see pertinent positives and negatives per HPI.   Current Outpatient Medications on File Prior to Visit  Medication Sig Dispense Refill  . albuterol (VENTOLIN HFA) 108 (90 Base) MCG/ACT inhaler Inhale 2 puffs into the lungs every 4 (four) hours  as needed for wheezing or shortness of breath. 25.5 g 1  . bisoprolol (ZEBETA) 10 MG tablet TAKE 1 TABLET TWICE DAILY 180 tablet 1  . budesonide-formoterol (SYMBICORT) 80-4.5 MCG/ACT inhaler Inhale 2 puffs into the lungs 2 (two) times daily. 3 Inhaler 3  . desonide (DESOWEN) 0.05 % lotion Apply topically 2 (two) times daily. Continue following with dermatologist 59 mL 0  . dextromethorphan (DELSYM) 30 MG/5ML liquid Take by mouth as needed for cough.     . hydrochlorothiazide (HYDRODIURIL) 25 MG tablet Take 1 tablet (25 mg total) by mouth daily. 90 tablet 3  . loratadine (CLARITIN) 10 MG tablet Take 10 mg by mouth daily as needed for allergies.    . predniSONE (DELTASONE) 10 MG tablet Take 0.5 tablets (5 mg total) by mouth daily with breakfast. 90 tablet 3  . RABEprazole (ACIPHEX) 20 MG tablet Take 1 tablet (20 mg total) by mouth daily before breakfast. 90 tablet 3  . simvastatin (ZOCOR) 20 MG tablet Take 1 tablet (20 mg total) by mouth at bedtime. 90 tablet 3  . vitamin C (ASCORBIC ACID) 500 MG tablet Take 500 mg by mouth daily.     No current facility-administered medications on file prior to visit.     Past Medical History:  Diagnosis Date  . Bifid uvula   . GERD (gastroesophageal reflux disease)   . History of cervical dysplasia   . History of chemotherapy    06/ 2005  . History of cleft lip    CORRECTED  . History of condyloma acuminatum    PERIANAL  .  History of radiation therapy    completed 06/ 2005  . 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 History of vulvar dysplasia   . Hypertension   . 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 Pulmonary nodule, left   . 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 Upper airway cough syndrome   . Wears glasses      Allergies  Allergen Reactions  . Shellfish Allergy Hives, Itching and Swelling  . Penicillins Hives and Itching    Has patient had a PCN reaction causing immediate rash, facial/tongue/throat swelling, SOB or lightheadedness with hypotension: Yes Has patient had a PCN reaction causing severe rash involving mucus membranes or skin necrosis: Yes Has patient had a PCN reaction that required hospitalization: Yes Has patient had a PCN reaction occurring within the last 10 years: No If all of the above answers are "NO", then may proceed with Cephalosporin use.     Social History   Socioeconomic History  . Marital status: Single    Spouse name: Not on file  . Number of children: 2  . Years of education: Not on file  . Highest education level: Not on file  Occupational History  . Occupation: retired    Fish farm manager: [employer]  Tobacco Use  . Smoking status: Former Smoker    Packs/day: 0.50    Years: 1.00    Pack years: 0.50    Types: Cigarettes    Quit date: 12/16/1993    Years since quitting: 26.1  . Smokeless tobacco: Never Used  Substance and Sexual Activity  . Alcohol use: Yes    Comment: occasional  . Drug use: No  . Sexual activity: Never  Other Topics Concern  . Not on file  Social History Narrative  . Not on file   Social Determinants of Health   Financial Resource Strain:   . Difficulty of Paying Living Expenses: Not on file  Food Insecurity:   . Worried About Charity fundraiser in the Last Year: Not on file  . Ran Out of Food in the Last Year: Not on file  Transportation Needs:   . Lack of Transportation (Medical): Not on file  . Lack of Transportation (Non-Medical): Not on file  Physical Activity:   . Days of Exercise per Week: Not on file  . Minutes of Exercise per Session: Not on file  Stress:   . Feeling of Stress : Not on file  Social Connections:   . Frequency of Communication with Friends and Family: Not on file  . Frequency of Social Gatherings with  Friends and Family: Not on file  . Attends Religious Services: Not on file  . Active Member of Clubs or Organizations: Not on file  . Attends Archivist Meetings: Not on file  . Marital Status: Not on file    Vitals:   01/26/20 0928  BP: 136/80  Pulse: 68  Resp: 12  SpO2: 97%   Body mass index is 36.95 kg/m.  Physical Exam  Nursing note and vitals reviewed. Constitutional: She is oriented to person, place, and time. She appears well-developed. No distress.  HENT:  Head: Normocephalic and atraumatic.  Eyes: Pupils are equal, round, and reactive to light. Conjunctivae are normal.  Cardiovascular: Normal rate and regular rhythm.  No murmur heard. Pulses:      Dorsalis pedis pulses are 2+ on the right side and 2+ on the left side.  No calves pain with dorsiflexion, no erythema. Varicose veins, L>R.  Respiratory: Effort normal and breath sounds normal. No respiratory distress.  GI: Soft. She exhibits no mass. There is no hepatomegaly. There is no abdominal tenderness.  Musculoskeletal:        General: Edema (1+ pitting LE edema,bilateral.) present.       Legs:  Lymphadenopathy:    She has no cervical adenopathy.  Neurological: She is alert and oriented to person, place, and time. She has normal strength. No cranial nerve deficit. Gait normal.  Skin: Skin is warm. Lesion noted. No rash noted. No erythema.  Left perianal pediculated lesion with wide base, oozing. Left medial pretibial area no abnormality on inspection. Palpated a nodular lesion,not mobile,defined borders, 4 cm x 3 cm,mildly tender.  Psychiatric: She has a normal mood and affect.  Well groomed, good eye contact.    ASSESSMENT AND PLAN:  Ms. Jaxon was seen today for leg pain.  Diagnoses and all orders for this visit:  Leg mass, left Most likely benign. ? Sebaceus cyst. Soft tissue US will be arranged.  -     [place] LT LOWER EXTREM LTD SOFT TISSUE NON VASCULAR; Future  Pain of left anterior lower  extremity ? Radicular pain,neuropathy. She agrees with trying Gabapentin,starting with 100 mg and titrate dose to 300 mg as tolerated. If not better, we need to consider lumbar MRI,EMG,and.or neuro evaluation.  -     gabapentin (NEURONTIN) 100 MG capsule; Take 3 capsules (300 mg total) by mouth at bedtime.  Varicose veins of left lower extremity, unspecified whether complicated This problem could explain some of symptoms. Hx does not suggest an acute process. LE elevation and/or compression stocking recommended. Instructed about warning signs.  Perianal condylomata Topical steroid helps with pruritus. Side effects discussed, recommend applying a small amount at the time daily as needed.  Other orders -     triamcinolone (KENALOG) 0.025 % cream; Apply 1 application topically 2 (two) times daily.    Return in about 4 weeks (around 02/23/2020).    Remmie Bembenek G. [name], MD  [facility]. [place] office.

## 2020-01-26 NOTE — Telephone Encounter (Signed)
See CTAP 01/19/2020 notes. Pt to proceed with a colonoscopy as scheduled with Dr. Fuller Plan on 02/22/2020.

## 2020-01-26 NOTE — Patient Instructions (Addendum)
A few things to remember from today's visit:   Leg mass, left - Plan: [place] LT LOWER EXTREM LTD SOFT TISSUE NON VASCULAR  Pain of left anterior lower extremity - Plan: gabapentin (NEURONTIN) 100 MG capsule  Varicose veins of left lower extremity, unspecified whether complicated  I do not think it is a blood clot. ? Lipoma,sebaceous cyst.  Monitor for changed. Caution with topical steroid on affected area, small amount 2 times daily as needed for itching.   Please be sure medication list is accurate. If a new problem present, please set up appointment sooner than planned today.

## 2020-02-10 ENCOUNTER — Ambulatory Visit
Admission: RE | Admit: 2020-02-10 | Discharge: 2020-02-10 | Disposition: A | Discharge status: Home / Self Care | Payer: Medicare HMO | Source: Ambulatory Visit | Attending: Radiation Oncology | Admitting: Radiation Oncology

## 2020-02-10 ENCOUNTER — Encounter: Payer: Self-pay | Admitting: Radiation Oncology

## 2020-02-10 ENCOUNTER — Other Ambulatory Visit: Payer: Self-pay

## 2020-02-10 VITALS — BP 190/86 | HR 68 | Temp 98.2°F | Resp 20 | Wt 223.2 lb

## 2020-02-10 DIAGNOSIS — M7989 Other specified soft tissue disorders: Secondary | ICD-10-CM | POA: Diagnosis not present

## 2020-02-10 DIAGNOSIS — I700 Atherosclerosis of aorta: Secondary | ICD-10-CM | POA: Insufficient documentation

## 2020-02-10 DIAGNOSIS — Z8542 Personal history of malignant neoplasm of other parts of uterus: Secondary | ICD-10-CM | POA: Insufficient documentation

## 2020-02-10 DIAGNOSIS — D860 Sarcoidosis of lung: Secondary | ICD-10-CM | POA: Diagnosis not present

## 2020-02-10 DIAGNOSIS — Z08 Encounter for follow-up examination after completed treatment for malignant neoplasm: Secondary | ICD-10-CM | POA: Diagnosis not present

## 2020-02-10 DIAGNOSIS — Z8616 Personal history of COVID-19: Secondary | ICD-10-CM | POA: Insufficient documentation

## 2020-02-10 DIAGNOSIS — Z79899 Other long term (current) drug therapy: Secondary | ICD-10-CM | POA: Insufficient documentation

## 2020-02-10 DIAGNOSIS — Z923 Personal history of irradiation: Secondary | ICD-10-CM | POA: Insufficient documentation

## 2020-02-10 DIAGNOSIS — Z85048 Personal history of other malignant neoplasm of rectum, rectosigmoid junction, and anus: Secondary | ICD-10-CM | POA: Insufficient documentation

## 2020-02-10 DIAGNOSIS — C541 Malignant neoplasm of endometrium: Secondary | ICD-10-CM

## 2020-02-10 NOTE — Patient Instructions (Signed)
Coronavirus (COVID-19) Are you at risk?  Are you at risk for the Coronavirus (COVID-19)?  To be considered HIGH RISK for Coronavirus (COVID-19), you have to meet the following criteria:  . Traveled to [place], [place], [place], [place] or [place]; or in the [place] to [place], [place], [place], or [place]; and have fever, cough, and shortness of breath within the last 2 weeks of travel OR . Been in close contact with a person diagnosed with COVID-19 within the last 2 weeks and have fever, cough, and shortness of breath . IF YOU DO NOT MEET THESE CRITERIA, YOU ARE CONSIDERED LOW RISK FOR COVID-19.  What to do if you are HIGH RISK for COVID-19?  . If you are having a medical emergency, call 911. . Seek medical care right away. Before you go to a doctor's office, urgent care or emergency department, call ahead and tell them about your recent travel, contact with someone diagnosed with COVID-19, and your symptoms. You should receive instructions from your physician's office regarding next steps of care.  . When you arrive at healthcare provider, tell the healthcare staff immediately you have returned from visiting [place], [place], [place], [place] or [place]; or traveled in the [place] to [place], [place], [place], or [place]; in the last two weeks or you have been in close contact with a person diagnosed with COVID-19 in the last 2 weeks.   . Tell the health care staff about your symptoms: fever, cough and shortness of breath. . After you have been seen by a medical provider, you will be either: o Tested for (COVID-19) and discharged home on quarantine except to seek medical care if symptoms worsen, and asked to  - Stay home and avoid contact with others until you get your results (4-5 days)  - Avoid travel on public transportation if possible (such as bus, train, or airplane) or o Sent to the Emergency Department by EMS for evaluation, COVID-19 testing, and possible  admission depending on your condition and test results.  What to do if you are LOW RISK for COVID-19?  Reduce your risk of any infection by using the same precautions used for avoiding the common cold or flu:  . Wash your hands often with soap and warm water for at least 20 seconds.  If soap and water are not readily available, use an alcohol-based hand sanitizer with at least 60% alcohol.  . If coughing or sneezing, cover your mouth and nose by coughing or sneezing into the elbow areas of your shirt or coat, into a tissue or into your sleeve (not your hands). . Avoid shaking hands with others and consider head nods or verbal greetings only. . Avoid touching your eyes, nose, or mouth with unwashed hands.  . Avoid close contact with people who are sick. . Avoid places or events with large numbers of people in one location, like concerts or sporting events. . Carefully consider travel plans you have or are making. . If you are planning any travel outside or inside the [place], visit the CDC's Travelers' Health webpage for the latest health notices. . If you have some symptoms but not all symptoms, continue to monitor at home and seek medical attention if your symptoms worsen. . If you are having a medical emergency, call 911.   ADDITIONAL HEALTHCARE OPTIONS FOR PATIENTS  [facility] / e-Visit: [email or web address]         [facility]: [phone]  [facility]: [phone]                   [facility]: [phone]   

## 2020-02-10 NOTE — Progress Notes (Signed)
Radiation Oncology         [phone] ________________________________  Name: [name] MRN: [id number]  Date: 02/10/2020  DOB: June 20, 1956  Follow-Up Visit Note  CC: [name], [name], MD  Everitt Amber, MD    ICD-10-CM   1. Endometrial cancer ([place])  C54.1     Diagnosis:   64 y.o. 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 Tammi Klippel)  Narrative:  The patient returns today for routine follow up. She tested positive for Covid-19 on 12/23/2019.  She recently underwent abdomen/pelvis CT on 01/19/2020 for abdominal pain, which showed: no definite findings to explain pain;evidence suggestive of nonspecific infectious, inflammatory, or ischemic colitis; trace, nonspecific free fluid in low pelvic, possibly reactive; unchanged prominent retroperitoneal and iliac lymph nodes, no new findings suspicious for metastatic disease in the abdomen or pelvis.  She is scheduled for colonoscopy with Dr. Fuller Plan on 02/22/2020.  On review of systems, she occasionally will notice some pain deep within the pelvis.  She denies any hematuria vaginal bleeding or rectal bleeding.  She has had some recent problems with constipation which is unusual for her.  She is not using her vaginal dilator and I recommended she back to this exercise.  Patient has had some swelling in her left lower extremity and did see primary care physician Dr. [name] who is ordered Doppler of the left lower extremity.  Patient continues to follow-up closely with Dr. 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 Patient did contract COVID-19 last month was able to avoid hospitalization with  use of antibiotic therapy.  She reports being very sick for approximately 2 weeks with cough fever nausea and loss of taste and smell.  She continues to have some fatigue from this issue but denies any other residual issues  ALLERGIES:  is allergic to shellfish allergy and penicillins.  Meds: Current Outpatient Medications  Medication Sig Dispense Refill  . albuterol (VENTOLIN HFA) 108 (90 Base) MCG/ACT inhaler Inhale 2 puffs into the lungs every 4 (four) hours as needed for wheezing or shortness of breath. 25.5 g 1  . bisoprolol (ZEBETA) 10 MG tablet TAKE 1 TABLET TWICE DAILY 180 tablet 1  . budesonide-formoterol (SYMBICORT) 80-4.5 MCG/ACT inhaler Inhale 2 puffs into the lungs 2 (two) times daily. 3 Inhaler 3  . desonide (DESOWEN) 0.05 % lotion Apply topically 2 (two) times daily. Continue following with dermatologist 59 mL 0  . dextromethorphan (DELSYM) 30 MG/5ML liquid Take by mouth as needed for cough.     . gabapentin (NEURONTIN) 100 MG capsule Take 3 capsules (300 mg total) by mouth at bedtime. 90 capsule 0  . hydrochlorothiazide (HYDRODIURIL) 25 MG tablet Take 1 tablet (25 mg total) by mouth daily. 90 tablet 3  . loratadine (CLARITIN) 10 MG tablet Take 10 mg by mouth daily as needed for allergies.    . predniSONE (DELTASONE) 10 MG tablet Take 0.5 tablets (5 mg total) by mouth daily with breakfast. 90 tablet 3  . RABEprazole (ACIPHEX) 20 MG tablet Take 1 tablet (20 mg total) by mouth daily before breakfast. 90 tablet 3  . simvastatin (ZOCOR) 20 MG tablet Take 1 tablet (20  mg total) by mouth at bedtime. 90 tablet 3  . triamcinolone (KENALOG) 0.025 % cream Apply 1 application topically 2 (two) times daily. 30 g 2  . vitamin C (ASCORBIC ACID) 500 MG tablet Take 500 mg by mouth daily.     No current facility-administered medications for this encounter.    Physical Findings: The patient is in no acute distress. Patient is alert and oriented.  weight is 223 lb 3.2 oz (101.2 kg). Her  temperature is 98.2 F (36.8 C). Her blood pressure is 190/86 (abnormal) and her pulse is 68. Her respiration is 20 and oxygen saturation is 98%.   Lungs are clear to auscultation bilaterally. Heart has regular rate and rhythm. No palpable cervical, supraclavicular, or axillary adenopathy. Abdomen soft, non-tender, normal bowel sounds.  On pelvic examination the external genitalia were unremarkable. A speculum exam was performed. There are no mucosal lesions noted in the vaginal vault. On bimanual examination there were no pelvic masses appreciated.  Vaginal cuff intact.  Some radiation changes noted at the vaginal cuff. patient was noted to have some radiation changes along the anus.  A rectal exam was attempted but was too uncomfortable for the patient.  She will have a colonoscopy as above in the near future to the address this issue  Lab Findings: Lab Results  Component Value Date   WBC 7.3 01/14/2020   HGB 11.8 (L) 01/14/2020   HCT 34.6 (L) 01/14/2020   MCV 91.8 01/14/2020   PLT 176.0 01/14/2020    Radiographic Findings: CT Abdomen Pelvis W Contrast  Result Date: 01/19/2020 CLINICAL DATA:  Lower abdominal pain, history of rectal cancer, ovarian cancer, sarcoidosis EXAM: CT ABDOMEN AND PELVIS WITH CONTRAST TECHNIQUE: Multidetector CT imaging of the abdomen and pelvis was performed using the standard protocol following bolus administration of intravenous contrast. CONTRAST:  149mL OMNIPAQUE IOHEXOL 300 MG/ML SOLN, additional oral enteric contrast COMPARISON:  CT chest abdomen pelvis, 07/20/2018 FINDINGS: Lower chest: Bibasilar pulmonary fibrosis, better demonstrated on prior CT chest. Bulky, partially calcified hilar and mediastinal lymph nodes, partially imaged. Hepatobiliary: No focal liver abnormality is seen. Status post cholecystectomy. No biliary dilatation. Pancreas: Unremarkable. No pancreatic ductal dilatation or surrounding inflammatory changes. Spleen: Normal in size without  significant abnormality. Adrenals/Urinary Tract: Adrenal glands are unremarkable. Kidneys are normal, without renal calculi, solid lesion, or hydronephrosis. Bladder is unremarkable. Stomach/Bowel: Stomach is within normal limits. Appendix appears normal. The sigmoid colon and rectum are decompressed although slightly thickened appearing (series 2, image 69, 65). Vascular/Lymphatic: Aortic atherosclerosis. Unchanged prominent retroperitoneal and iliac lymph nodes, an index right iliac lymph node measuring 1.3 x 1.1 cm (series 2, image 70). Reproductive: Status post hysterectomy. Other: No abdominal wall hernia or abnormality. Trace free fluid in the low pelvis (series 2, image 71). Musculoskeletal: No acute or significant osseous findings. IMPRESSION: 1. No definite CT findings of the abdomen or pelvis to explain pain. 2. The sigmoid colon and rectum are decompressed although slightly thickened appearing, suggestive of nonspecific infectious, inflammatory, or ischemic colitis. Correlate for referable clinical symptoms. 3. There is trace, nonspecific free fluid in the low pelvis, which may be reactive, although would be difficult to distinguish from postoperative and post treatment soft tissue change in the pelvis given history of hysterectomy and rectal cancer. 4. Unchanged prominent retroperitoneal and iliac lymph nodes. No new findings suspicious for metastatic disease in the abdomen or pelvis. 5. Bibasilar pulmonary fibrosis and mediastinal and hilar lymphadenopathy, better evaluated by prior CT chest and consistent with patient diagnosis  of sarcoidosis. Electronically Signed   By: Eddie Candle M.D.   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  She completed vaginal brachytherapy for this malignancy but was unable to complete external beam radiation therapy given her prior external beam with her anal carcinoma  treatment. No evidence of recurrence on clinical exam.   Plan:  She will see Dr. Denman George in 3 months and radiation oncology in 6 months.  Colonoscopy and Doppler as above in the near future.    ____________________________________  Blair Promise, PhD, MD  This document serves as a record of services personally performed by Gery Pray, MD. It was created on his behalf by Wilburn Mylar, a trained medical scribe. The creation of this record is based on the scribe's personal observations and the provider's statements to them. This document has been checked and approved by the attending provider.

## 2020-02-10 NOTE — Progress Notes (Signed)
[name] presents today for f/u with Dr. Sondra Come. Pt denies c/o pain today. Pt still c/o deep pelvic pain occasionally. Pt has not had any further re-check with urine labs. Pt denies dysuria/hematuria. Pt denies vaginal bleeding/discharge. Pt denies rectal bleeding. Pt reports ongoing constipation that is controlled with diet and laxatives. Pt denies abdominal bloating, and reports one episode of dry heaves approximately two weeks ago.   Pt is scheduled for colonoscopy on March 9.   BP on manual recheck per this RN is 160/94.   BP (!) 190/86 (BP Location: Right Wrist, Patient Position: Sitting, Cuff Size: Normal)   Pulse 68   Temp 98.2 F (36.8 C)   Resp 20   Wt 223 lb 3.2 oz (101.2 kg)   LMP 12/17/2003   SpO2 98%   BMI 36.58 kg/m   Wt Readings from Last 3 Encounters:  02/10/20 223 lb 3.2 oz (101.2 kg)  01/26/20 225 lb 8 oz (102.3 kg)  01/14/20 230 lb (104.3 kg)   [name], RN BSN

## 2020-02-14 ENCOUNTER — Ambulatory Visit
Admission: RE | Admit: 2020-02-14 | Discharge: 2020-02-14 | Disposition: A | Discharge status: Home / Self Care | Payer: Medicare HMO | Source: Ambulatory Visit | Attending: Family Medicine | Admitting: Family Medicine

## 2020-02-14 DIAGNOSIS — R600 Localized edema: Secondary | ICD-10-CM | POA: Diagnosis not present

## 2020-02-14 DIAGNOSIS — R2242 Localized swelling, mass and lump, left lower limb: Secondary | ICD-10-CM

## 2020-02-15 ENCOUNTER — Telehealth: Payer: Self-pay | Admitting: *Deleted

## 2020-02-15 ENCOUNTER — Encounter: Payer: Self-pay | Admitting: Family Medicine

## 2020-02-15 NOTE — Telephone Encounter (Signed)
[name] in radiation called and scheduled an appt for May. She will contact the patient

## 2020-02-15 NOTE — Telephone Encounter (Signed)
CALLED PATIENT TO INFORM OF FU WITH DR. Denman George ON 05-10-20 - ARRIVAL TIME- 2 PM AND FU WITH DR. KINARD ON 08-14-20 @ 3:30 PM, NO ANSWER, UNABLE TO LEAVE MESSAGE DUE TO VM BEING FULL, MAILED APPT. CARDS

## 2020-02-17 ENCOUNTER — Encounter: Payer: Self-pay | Admitting: Gastroenterology

## 2020-02-18 ENCOUNTER — Telehealth: Payer: Self-pay | Admitting: Gastroenterology

## 2020-02-18 NOTE — Telephone Encounter (Signed)
ROI faxed to [facility] 3/5/21fbg

## 2020-02-22 ENCOUNTER — Encounter: Payer: Self-pay | Admitting: Gastroenterology

## 2020-02-22 ENCOUNTER — Other Ambulatory Visit: Payer: Self-pay

## 2020-02-22 ENCOUNTER — Ambulatory Visit (AMBULATORY_SURGERY_CENTER): Payer: Medicare HMO | Admitting: Gastroenterology

## 2020-02-22 VITALS — BP 178/95 | HR 64 | Temp 97.7°F | Resp 17 | Ht 65.0 in | Wt 230.0 lb

## 2020-02-22 DIAGNOSIS — Z85048 Personal history of other malignant neoplasm of rectum, rectosigmoid junction, and anus: Secondary | ICD-10-CM | POA: Diagnosis not present

## 2020-02-22 DIAGNOSIS — D125 Benign neoplasm of sigmoid colon: Secondary | ICD-10-CM | POA: Diagnosis not present

## 2020-02-22 DIAGNOSIS — Z86010 Personal history of colonic polyps: Secondary | ICD-10-CM

## 2020-02-22 DIAGNOSIS — D120 Benign neoplasm of cecum: Secondary | ICD-10-CM | POA: Diagnosis not present

## 2020-02-22 DIAGNOSIS — D123 Benign neoplasm of transverse colon: Secondary | ICD-10-CM | POA: Diagnosis not present

## 2020-02-22 MED ORDER — SODIUM CHLORIDE 0.9 % IV SOLN: 500.0000 mL | Freq: Once | INTRAVENOUS | Status: DC

## 2020-02-22 NOTE — Progress Notes (Signed)
Surgical referral for nodular perianal skin lesion, per procedure report.

## 2020-02-22 NOTE — Patient Instructions (Signed)
YOU HAD AN ENDOSCOPIC PROCEDURE TODAY AT THE [facility]:   Refer to the procedure report that was given to you for any specific questions about what was found during the examination.  If the procedure report does not answer your questions, please call your gastroenterologist to clarify.  If you requested that your care partner not be given the details of your procedure findings, then the procedure report has been included in a sealed envelope for you to review at your convenience later.  YOU SHOULD EXPECT: Some feelings of bloating in the abdomen. Passage of more gas than usual.  Walking can help get rid of the air that was put into your GI tract during the procedure and reduce the bloating. If you had a lower endoscopy (such as a colonoscopy or flexible sigmoidoscopy) you may notice spotting of blood in your stool or on the toilet paper. If you underwent a bowel prep for your procedure, you may not have a normal bowel movement for a few days.  Please Note:  You might notice some irritation and congestion in your nose or some drainage.  This is from the oxygen used during your procedure.  There is no need for concern and it should clear up in a day or so.  SYMPTOMS TO REPORT IMMEDIATELY:   Following lower endoscopy (colonoscopy or flexible sigmoidoscopy):  Excessive amounts of blood in the stool  Significant tenderness or worsening of abdominal pains  Swelling of the abdomen that is new, acute  Fever of 100F or higher  For urgent or emergent issues, a gastroenterologist can be reached at any hour by calling [phone]. Do not use MyChart messaging for urgent concerns.    DIET:  We do recommend a small meal at first, but then you may proceed to your regular diet.  Drink plenty of fluids but you should avoid alcoholic beverages for 24 hours.  ACTIVITY:  You should plan to take it easy for the rest of today and you should NOT DRIVE or use heavy machinery until tomorrow (because  of the sedation medicines used during the test).    FOLLOW UP: Our staff will call the number listed on your records 48-72 hours following your procedure to check on you and address any questions or concerns that you may have regarding the information given to you following your procedure. If we do not reach you, we will leave a message.  We will attempt to reach you two times.  During this call, we will ask if you have developed any symptoms of COVID 19. If you develop any symptoms (ie: fever, flu-like symptoms, shortness of breath, cough etc.) before then, please call [phone].  If you test positive for Covid 19 in the 2 weeks post procedure, please call and report this information to us.    If any biopsies were taken you will be contacted by phone or by letter within the next 1-3 weeks.  Please call us at [phone] if you have not heard about the biopsies in 3 weeks.    SIGNATURES/CONFIDENTIALITY: You and/or your care partner have signed paperwork which will be entered into your electronic medical record.  These signatures attest to the fact that that the information above on your After Visit Summary has been reviewed and is understood.  Full responsibility of the confidentiality of this discharge information lies with you and/or your care-partner. 

## 2020-02-22 NOTE — Progress Notes (Signed)
Called to room to assist during endoscopic procedure.  Patient ID and intended procedure confirmed with present staff. Received instructions for my participation in the procedure from the performing physician.  

## 2020-02-22 NOTE — Progress Notes (Signed)
PT taken to PACU. Monitors in place. VSS. Report given to RN.  Pt vomited a small amount of bile colored fluid. Suctioning provided immediately. Discussed the risk of aspiration, including S/S to watch for. She has been informed to seek medical assistance if any difficulties should arise.

## 2020-02-22 NOTE — Op Note (Addendum)
[place] Patient Name: [name] Procedure Date: 02/22/2020 8:14 AM MRN: [id number] Endoscopist: Ladene Artist , MD Age: 64 Referring MD:  Date of Birth: 02/28/1956 Gender: Female Account #: [id number] Procedure:                Colonoscopy Indications:              High risk colon cancer surveillance: Personal                            history of colonic polyps. Personal history of anal                            cancer. Medicines:                Monitored Anesthesia Care Procedure:                Pre-Anesthesia Assessment:                           - Prior to the procedure, a History and Physical                            was performed, and patient medications and                            allergies were reviewed. The patient's tolerance of                            previous anesthesia was also reviewed. The risks                            and benefits of the procedure and the sedation                            options and risks were discussed with the patient.                            All questions were answered, and informed consent                            was obtained. Prior Anticoagulants: The patient has                            taken no previous anticoagulant or antiplatelet                            agents. ASA Grade Assessment: III - A patient with                            severe systemic disease. After reviewing the risks                            and benefits, the patient was deemed in  satisfactory condition to undergo the procedure.                           After obtaining informed consent, the colonoscope                            was passed under direct vision. Throughout the                            procedure, the patient's blood pressure, pulse, and                            oxygen saturations were monitored continuously. The                            Colonoscope was introduced through the anus and                            advanced to the the cecum, identified by                            appendiceal orifice and ileocecal valve. The                            ileocecal valve, appendiceal orifice, and rectum                            were photographed. The quality of the bowel                            preparation was fair after extensive lavage and                            suctioning. The colonoscopy was technically                            difficult and complex due to restricted mobility of                            the colon. Initially unable to advance around a                            tortuous and fixed sigmoid colon. Successful                            completion of the procedure was aided by                            withdrawing the scope and replacing with the                            pediatric colonoscope and withdrawing the scope and  replacing with the adult endoscope. The patient                            tolerated the procedure fairly well. The                            Colonoscope was introduced through the and advanced                            to the. The Endoscope was introduced through the                            and advanced to the. Scope In: 8:44:54 AM Scope Out: 9:27:59 AM Scope Withdrawal Time: 0 hours 28 minutes 56 seconds  Total Procedure Duration: 0 hours 43 minutes 5 seconds  Findings:                 The perianal and digital rectal examinations show a                            nodular skin lesion extending in the interguteal                            cleft.                           A 12 mm polyp was found in the cecum. The polyp was                            semi-pedunculated. The polyp was removed with a hot                            snare. Resection and retrieval were complete.                           Twelve sessile polyps were found in the sigmoid                            colon (5) and transverse  colon (7). The polyps were                            6 to 8 mm in size. These polyps were removed with a                            cold snare. Resection and retrieval were complete.                           The exam was otherwise without abnormality on                            direct and retroflexion views. Mucosa appeared                            slightly edematous  in the sigmoid colon with a                            segment narrowing and a sharp curve the required                            the adult endoscope at 25 cm. Complications:            No immediate complications. Estimated blood loss:                            None. Estimated Blood Loss:     Estimated blood loss: none. Impression:               - Preparation of the colon was fair.                           - Nodular perianal skin lesion.                           - One 12 mm polyp in the cecum, removed with a hot                            snare. Resected and retrieved.                           - Twelve 6 to 8 mm polyps in the sigmoid colon and                            in the transverse colon, removed with a cold snare.                            Resected and retrieved.                           - Restricted mobility with a segmental narrowing in                            the sigmoid colon.                           - The examination was otherwise normal on direct                            and retroflexion views. Recommendation:           - Repeat colonoscopy after studies are complete for                            surveillance based on pathology results with a more                            extensive bowel prep.                           - Patient has a contact  number available for                            emergencies. The signs and symptoms of potential                            delayed complications were discussed with the                            patient. Return to normal activities tomorrow.                             Written discharge instructions were provided to the                            patient.                           - Resume previous diet.                           - Continue present medications.                           - Await pathology results.                           - No aspirin, ibuprofen, naproxen, or other                            non-steroidal anti-inflammatory drugs for 2 weeks                            after polyp removal.                           - Surgical referral for nodular perianal skin                            lesion. Ladene Artist, MD 02/22/2020 9:43:11 AM This report has been signed electronically.

## 2020-02-22 NOTE — Progress Notes (Signed)
Vitals by CW, temp by [facility]

## 2020-02-24 ENCOUNTER — Telehealth: Payer: Self-pay

## 2020-02-24 NOTE — Telephone Encounter (Signed)
  Follow up Call-  Call back number 02/22/2020  Post procedure Call Back phone  # [phone]  Permission to leave phone message Yes  Some recent data might be hidden     Patient questions:  Do you have a fever, pain , or abdominal swelling? No. Pain Score  0 *  Have you tolerated food without any problems? Yes.    Have you been able to return to your normal activities? Yes.    Do you have any questions about your discharge instructions: Diet   No. Medications  No. Follow up visit  No.  Do you have questions or concerns about your Care? No.  Actions: * If pain score is 4 or above: No action needed, pain <4. 1. Have you developed a fever since your procedure? no  2.   Have you had an respiratory symptoms (SOB or cough) since your procedure? no  3.   Have you tested positive for COVID 19 since your procedure no  4.   Have you had any family members/close contacts diagnosed with the COVID 19 since your procedure?  no   If yes to any of these questions please route to Joylene John, RN and Alphonsa Gin, Therapist, sports.

## 2020-02-29 ENCOUNTER — Telehealth: Payer: Self-pay

## 2020-02-29 ENCOUNTER — Other Ambulatory Visit: Payer: Self-pay

## 2020-02-29 NOTE — Telephone Encounter (Signed)
Patient notified she will be contacted directly by [facility] with an appt to evaluate her perianal skin lesion.

## 2020-03-01 ENCOUNTER — Ambulatory Visit (INDEPENDENT_AMBULATORY_CARE_PROVIDER_SITE_OTHER): Payer: Medicare HMO | Admitting: Family Medicine

## 2020-03-01 ENCOUNTER — Encounter: Payer: Self-pay | Admitting: Family Medicine

## 2020-03-01 VITALS — BP 136/70 | HR 78 | Resp 16 | Ht 65.0 in | Wt 225.0 lb

## 2020-03-01 DIAGNOSIS — I10 Essential (primary) hypertension: Secondary | ICD-10-CM

## 2020-03-01 DIAGNOSIS — R600 Localized edema: Secondary | ICD-10-CM

## 2020-03-01 DIAGNOSIS — E876 Hypokalemia: Secondary | ICD-10-CM

## 2020-03-01 DIAGNOSIS — M79605 Pain in left leg: Secondary | ICD-10-CM | POA: Diagnosis not present

## 2020-03-01 DIAGNOSIS — R7309 Other abnormal glucose: Secondary | ICD-10-CM

## 2020-03-01 DIAGNOSIS — K5900 Constipation, unspecified: Secondary | ICD-10-CM

## 2020-03-01 DIAGNOSIS — E782 Mixed hyperlipidemia: Secondary | ICD-10-CM

## 2020-03-01 LAB — LIPID PANEL
Cholesterol: 220 mg/dL — ABNORMAL HIGH (ref 0–200)
HDL: 32.4 mg/dL — ABNORMAL LOW (ref >39.00)
NonHDL: 187.45
Total CHOL/HDL Ratio: 7
Triglycerides: 258 mg/dL — ABNORMAL HIGH (ref 0.0–149.0)
VLDL: 51.6 mg/dL — ABNORMAL HIGH (ref 0.0–40.0)

## 2020-03-01 LAB — LDL CHOLESTEROL, DIRECT: Direct LDL: 134 mg/dL

## 2020-03-01 LAB — POTASSIUM: Potassium: 3.6 mEq/L (ref 3.5–5.1)

## 2020-03-01 LAB — HEMOGLOBIN A1C: Hgb A1c MFr Bld: 6.3 % (ref 4.6–6.5)

## 2020-03-01 NOTE — Progress Notes (Signed)
HPI:   [name] is a 64 y.o. female, who is here today for follow up. She was seen last on 01/26/20. Left anterior intermittent, sharp leg pain and burning like sensation. Pain has improved with gabapentin 300 mg at bedtime,  It is now 5/10. She has tolerated gabapentin well, she has some drowsiness after taking it but is sleeping better.  Negative for numbness and tingling.  She is still concerned about LE edema, L>R. Edema is better in the morning when she first gets up and exacerbated by prolonged sitting/standing/walking. She has not noted erythema or skin lesions.  She has not tried compression stocking as recommended last visit.  HLD: She is on Simvastatin 20 mg daily. Tolerating medications well.  Lab Results  Component Value Date   CHOL 276 (H) 09/29/2019   HDL 35.30 (L) 09/29/2019   LDLCALC (H) 01/21/2008    140        Total Cholesterol/HDL:CHD Risk Coronary Heart Disease Risk Table                     Men   Women  1/2 Average Risk   3.4   3.3   LDLDIRECT 169.0 09/29/2019   TRIG 321.0 (H) 09/29/2019   CHOLHDL 8 09/29/2019   -She is trying to follow a healthier diet, she has noted weight loss. Not able to exercise regularly due to pain.  -Glucose has been mildly elevated, 119 on 01/14/2020. Negative for polydipsia,polyuria, or polyphagia. On chronic prednisone treatment for sarcoidosis.  Lab Results  Component Value Date   CREATININE 0.76 01/14/2020   BUN 13 01/14/2020   NA 138 01/14/2020   K 3.4 (L) 01/14/2020   CL 100 01/14/2020   CO2 31 01/14/2020   She is not on potassium supplementation. She takes HCTZ 25 mg for hypertension.Also on Bisoprolol 10 mg daily.  She has a history of diarrhea and constipation. She feels like constipation is getting worse after she recent colonoscopy.=,02/22/20. She is having daily bowel movements but hard stool. She states that yesterday she "pushed so hard",she had bleeding. Miralax did not helping  before. Negative for associated abdominal pain, nausea, or vomiting.  Review of Systems  Constitutional: Negative for activity change, appetite change, fatigue and fever.  HENT: Negative for mouth sores, nosebleeds and sore throat.   Eyes: Negative for redness and visual disturbance.  Respiratory: Negative for cough and wheezing.   Cardiovascular: Negative for chest pain and palpitations.  Genitourinary: Negative for decreased urine volume and hematuria.  Musculoskeletal: Negative for gait problem and myalgias.  Neurological: Negative for syncope, weakness and headaches.  Rest see pertinent positives and negatives per HPI.  Current Outpatient Medications on File Prior to Visit  Medication Sig Dispense Refill  . albuterol (VENTOLIN HFA) 108 (90 Base) MCG/ACT inhaler Inhale 2 puffs into the lungs every 4 (four) hours as needed for wheezing or shortness of breath. 25.5 g 1  . bisoprolol (ZEBETA) 10 MG tablet TAKE 1 TABLET TWICE DAILY 180 tablet 1  . budesonide-formoterol (SYMBICORT) 80-4.5 MCG/ACT inhaler Inhale 2 puffs into the lungs 2 (two) times daily. 3 Inhaler 3  . desonide (DESOWEN) 0.05 % lotion Apply topically 2 (two) times daily. Continue following with dermatologist 59 mL 0  . dextromethorphan (DELSYM) 30 MG/5ML liquid Take by mouth as needed for cough.     . gabapentin (NEURONTIN) 100 MG capsule Take 3 capsules (300 mg total) by mouth at bedtime. 90 capsule 0  . hydrochlorothiazide (  HYDRODIURIL) 25 MG tablet Take 1 tablet (25 mg total) by mouth daily. 90 tablet 3  . loratadine (CLARITIN) 10 MG tablet Take 10 mg by mouth daily as needed for allergies.    . predniSONE (DELTASONE) 10 MG tablet Take 0.5 tablets (5 mg total) by mouth daily with breakfast. 90 tablet 3  . RABEprazole (ACIPHEX) 20 MG tablet Take 1 tablet (20 mg total) by mouth daily before breakfast. 90 tablet 3  . simvastatin (ZOCOR) 20 MG tablet Take 1 tablet (20 mg total) by mouth at bedtime. 90 tablet 3  .  triamcinolone (KENALOG) 0.025 % cream Apply 1 application topically 2 (two) times daily. 30 g 2  . vitamin C (ASCORBIC ACID) 500 MG tablet Take 500 mg by mouth daily.     No current facility-administered medications on file prior to visit.   Past Medical History:  Diagnosis Date  . Bifid uvula   . Blood transfusion without reported diagnosis   . GERD (gastroesophageal reflux disease)   . History of cervical dysplasia   . History of chemotherapy    06/ 2005  . History of cleft lip    CORRECTED  . History of condyloma acuminatum    PERIANAL  . History of radiation therapy    completed 06/ 2005  . 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 History of vulvar dysplasia   . Hypertension   . 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 Pulmonary nodule, left   . 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 Upper airway cough syndrome   . Wears glasses    Allergies  Allergen Reactions  . Shellfish Allergy Hives, Itching and Swelling  . Penicillins Hives and Itching    Has patient had a PCN reaction causing immediate rash, facial/tongue/throat swelling, SOB or lightheadedness with hypotension: Yes Has patient had a PCN reaction causing severe rash involving mucus membranes or skin necrosis: Yes Has patient had a PCN reaction that required hospitalization: Yes Has patient had a PCN reaction occurring within the last 10 years: No If all of the above answers are "NO", then may proceed with Cephalosporin use.     Social History   Socioeconomic History  . Marital status: Single    Spouse name: Not on file  . Number of children: 2  . Years of education: Not on file  . Highest education level: Not on file  Occupational History  . Occupation: retired    Fish farm manager:  [employer]  Tobacco Use  . Smoking status: Former Smoker    Packs/day: 0.50    Years: 1.00    Pack years: 0.50    Types: Cigarettes    Quit date: 12/16/1993    Years since quitting: 26.2  . Smokeless tobacco: Never Used  Substance and Sexual Activity  . Alcohol use: Yes    Comment: occasional  . Drug use: No  . Sexual activity: Never  Other Topics Concern  . Not on file  Social History Narrative  . Not on file   Social Determinants of Health   Financial Resource Strain:   . Difficulty of Paying Living Expenses:   Food Insecurity:   . Worried About Charity fundraiser in the Last Year:   . Ran  Out of Food in the Last Year:   Transportation Needs:   . Lack of Transportation (Medical):   Marland Kitchen Lack of Transportation (Non-Medical):   Physical Activity:   . Days of Exercise per Week:   . Minutes of Exercise per Session:   Stress:   . Feeling of Stress :   Social Connections:   . Frequency of Communication with Friends and Family:   . Frequency of Social Gatherings with Friends and Family:   . Attends Religious Services:   . Active Member of Clubs or Organizations:   . Attends Archivist Meetings:   Marland Kitchen Marital Status:     Vitals:   03/01/20 1012  BP: 136/70  Pulse: 78  Resp: 16   Wt Readings from Last 3 Encounters:  03/01/20 225 lb (102.1 kg)  02/22/20 230 lb (104.3 kg)  02/10/20 223 lb 3.2 oz (101.2 kg)   Body mass index is 37.44 kg/m.  Physical Exam  Nursing note and vitals reviewed. Constitutional: She is oriented to person, place, and time. She appears well-developed. No distress.  HENT:  Head: Normocephalic and atraumatic.  Eyes: Conjunctivae are normal.  Cardiovascular: Normal rate and regular rhythm.  No murmur heard. Pulses:      Dorsalis pedis pulses are 2+ on the right side and 2+ on the left side.  Respiratory: Effort normal and breath sounds normal. No respiratory distress.  GI: Soft. She exhibits no mass. There is no hepatomegaly. There is  no abdominal tenderness.  Musculoskeletal:        General: Edema (Trace pitting LE edema, bilateral) present.  Neurological: She is alert and oriented to person, place, and time. She has normal strength. No cranial nerve deficit. Gait normal.  Skin: Skin is warm. No rash noted. No erythema.  Psychiatric: She has a normal mood and affect.  Well groomed, good eye contact.   ASSESSMENT AND PLAN:  [name] was seen today for follow-up.  Orders Placed This Encounter  Procedures  . Potassium  . Lipid panel  . Hemoglobin A1c  . LDL cholesterol, direct  . Ambulatory referral to Vascular Surgery   Lab Results  Component Value Date   CHOL 220 (H) 03/01/2020   HDL 32.40 (L) 03/01/2020   LDLCALC (H) 01/21/2008    140        Total Cholesterol/HDL:CHD Risk Coronary Heart Disease Risk Table                     Men   Women  1/2 Average Risk   3.4   3.3   LDLDIRECT 134.0 03/01/2020   TRIG 258.0 (H) 03/01/2020   CHOLHDL 7 03/01/2020   Lab Results  Component Value Date   HGBA1C 6.3 03/01/2020    Bilateral lower extremity edema Most likely related to vein disease. Compression stocking recommended as well as LE elevation a few times during the day. She would like vascular evaluation.  Pain of left anterior lower extremity Improved with Gabapentin. ? Radicular pain. Other possible causes discussed,including vein disease.  Elevated glucose level Healthy life style for primary prevention. Further recommendations according to HgA1C.  Hypokalemia HCTZ side effects discussed. K+ rich diet for now.  Essential hypertension BP adequately controlled. No changes in current management. Continue low salt diet.  Hyperlipidemia, mixed Continue Simvastatin 20 mg daily, will adjust dose according to lipid panel results.  Constipation, unspecified constipation type IBS -C-D. Miralax and Bisacodyl 5 mg at night as needed. Adequate fiber and fluid intake.  Benefiber 1 tsp bid also  recommended. If problem is persistent we need to consider Linzess or amitiza.   Return in about 5 months (around 08/01/2020).   Henning Ehle G. [name], MD  [facility]. [place] office.   Continue a healthful diet,you are losing wt, which is great. Try to engage in low impact exercise. Miralax and Bisacodyl 5 mg at bedtime. Benefiber 1 tsp 2 times daily. Adequate hydration.  If cholesterol still high we need to consider increasing med. Compression stockings.

## 2020-03-01 NOTE — Patient Instructions (Signed)
Continue a healthful diet,you are losing wt, which is great. Try to engage in low impact exercise. Miralax and Bisacodyl 5 mg at bedtime. Benefiber 1 tsp 2 times daily. Adequate hydration.  If cholesterol still high we need to consider increasing med. Compression stockings.

## 2020-03-02 ENCOUNTER — Encounter: Payer: Self-pay | Admitting: Gastroenterology

## 2020-03-06 ENCOUNTER — Telehealth: Payer: Self-pay

## 2020-03-06 ENCOUNTER — Other Ambulatory Visit: Payer: Self-pay | Admitting: Family Medicine

## 2020-03-06 DIAGNOSIS — K5900 Constipation, unspecified: Secondary | ICD-10-CM

## 2020-03-06 MED ORDER — LINACLOTIDE 145 MCG PO CAPS: 145.0000 ug | ORAL_CAPSULE | Freq: Every day | ORAL | 1 refills | Status: DC

## 2020-03-06 NOTE — Telephone Encounter (Signed)
Prescription for Linzess 145 mcg to take daily was sent to her pharmacy. Thanks, BJ

## 2020-03-06 NOTE — Telephone Encounter (Signed)
I spoke with patient. The miralax is not helping with the constipation. She would like to move forward with trying one of the Rx's you recommended.

## 2020-03-15 NOTE — Telephone Encounter (Signed)
Patient has been scheduled to see Dr. Johney Maine with [facility] for 4/14 11:00

## 2020-03-29 ENCOUNTER — Encounter: Payer: Self-pay | Admitting: Surgery

## 2020-03-29 DIAGNOSIS — Z85048 Personal history of other malignant neoplasm of rectum, rectosigmoid junction, and anus: Secondary | ICD-10-CM | POA: Diagnosis not present

## 2020-03-29 DIAGNOSIS — D239 Other benign neoplasm of skin, unspecified: Secondary | ICD-10-CM | POA: Diagnosis not present

## 2020-03-29 DIAGNOSIS — C210 Malignant neoplasm of anus, unspecified: Secondary | ICD-10-CM | POA: Insufficient documentation

## 2020-03-29 DIAGNOSIS — Z86004 Personal history of in-situ neoplasm of other and unspecified digestive organs: Secondary | ICD-10-CM | POA: Diagnosis not present

## 2020-04-03 ENCOUNTER — Other Ambulatory Visit: Payer: Self-pay | Admitting: Family Medicine

## 2020-04-03 DIAGNOSIS — M79605 Pain in left leg: Secondary | ICD-10-CM

## 2020-04-08 ENCOUNTER — Other Ambulatory Visit: Payer: Self-pay | Admitting: Family Medicine

## 2020-04-08 MED ORDER — GABAPENTIN 300 MG PO CAPS: 300.0000 mg | ORAL_CAPSULE | Freq: Every day | ORAL | 3 refills | Status: DC

## 2020-04-12 ENCOUNTER — Other Ambulatory Visit: Payer: Self-pay | Admitting: *Deleted

## 2020-04-12 DIAGNOSIS — R600 Localized edema: Secondary | ICD-10-CM

## 2020-04-18 ENCOUNTER — Other Ambulatory Visit: Payer: Self-pay

## 2020-04-18 ENCOUNTER — Encounter: Payer: Self-pay | Admitting: Vascular Surgery

## 2020-04-18 ENCOUNTER — Ambulatory Visit (HOSPITAL_COMMUNITY)
Admission: RE | Admit: 2020-04-18 | Discharge: 2020-04-18 | Disposition: A | Discharge status: Home / Self Care | Payer: Medicare HMO | Source: Ambulatory Visit | Attending: Vascular Surgery | Admitting: Vascular Surgery

## 2020-04-18 ENCOUNTER — Ambulatory Visit: Payer: Medicare HMO | Admitting: Vascular Surgery

## 2020-04-18 VITALS — BP 175/84 | HR 86 | Temp 97.7°F | Resp 18 | Ht 65.5 in | Wt 224.0 lb

## 2020-04-18 DIAGNOSIS — R600 Localized edema: Secondary | ICD-10-CM

## 2020-04-18 NOTE — Progress Notes (Signed)
[facility] [place]  Patient name: [name] MRN: [id number] DOB: 08/07/1956 Sex: female  REASON FOR CONSULT: Evaluation of left leg edema  HPI: [name] is a 64 y.o. female, who is here today for evaluation of left leg edema.  She reports this is been present for approximately 1 month and has been progressive.  She reports that when she was working she did have routine use of compression garments.  Since her retirement she has not been doing this.  This is painful to her and has achy sensation at the end of the day.  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  She has no history of DVT.  She does have a history of sarcoidosis.  Also has a history of pelvic radiation for GYN cancer and also for anal cancer.  Past Medical History:  Diagnosis Date  . Bifid uvula   . Blood transfusion without reported diagnosis   . GERD (gastroesophageal reflux disease)   . History of cervical dysplasia   . History of chemotherapy    06/ 2005  . History of cleft lip    CORRECTED  . History of condyloma acuminatum    PERIANAL  . History of radiation therapy    completed 06/ 2005  . History of vulvar dysplasia   . Hypertension   . 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 Pulmonary nodule, left   . 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  ChemoXRT 2005 Dr Collier Salina Rubin/[facility]  . Upper airway cough syndrome   . Wears glasses     Family History  Problem Relation Age of Onset  . Allergies Mother   . Asthma Mother   . Lung cancer Mother   . Allergies Sister   . Allergies Brother   . Asthma Brother   . Asthma Sister   .  Sarcoidosis Neg Hx   . Colon cancer Neg Hx   . Liver cancer Neg Hx   . Stomach cancer Neg Hx   . Esophageal cancer Neg Hx   . Rectal cancer Neg Hx     SOCIAL HISTORY: Social History   Socioeconomic History  . Marital status: Single    Spouse name: Not on file  . Number of children: 2  . Years of education: Not on file  . Highest education level: Not on file  Occupational History  . Occupation: retired    Fish farm manager: [employer]  Tobacco Use  . Smoking status: Former Smoker    Packs/day: 0.50    Years: 1.00    Pack years: 0.50    Types: Cigarettes    Quit date: 12/16/1993    Years since quitting: 26.3  . Smokeless tobacco: Never Used  Substance and Sexual Activity  . Alcohol use: Yes    Comment: occasional  . Drug use: No  . Sexual activity: Never  Other Topics Concern  . Not on file  Social History Narrative  . Not on file   Social Determinants of Health   Financial Resource Strain:   . Difficulty of Paying Living Expenses:   Food Insecurity:   . Worried About Charity fundraiser in  the Last Year:   . [place] in the Last Year:   Transportation Needs:   . Film/video editor (Medical):   Marland Kitchen Lack of Transportation (Non-Medical):   Physical Activity:   . Days of Exercise per Week:   . Minutes of Exercise per Session:   Stress:   . Feeling of Stress :   Social Connections:   . Frequency of Communication with Friends and Family:   . Frequency of Social Gatherings with Friends and Family:   . Attends Religious Services:   . Active Member of Clubs or Organizations:   . Attends Archivist Meetings:   Marland Kitchen Marital Status:   Intimate Partner Violence:   . Fear of Current or Ex-Partner:   . Emotionally Abused:   Marland Kitchen Physically Abused:   . Sexually Abused:     Allergies  Allergen Reactions  . Shellfish Allergy Hives, Itching and Swelling  . Penicillins Hives and Itching    Has patient had a PCN reaction causing immediate rash, facial/tongue/throat  swelling, SOB or lightheadedness with hypotension: Yes Has patient had a PCN reaction causing severe rash involving mucus membranes or skin necrosis: Yes Has patient had a PCN reaction that required hospitalization: Yes Has patient had a PCN reaction occurring within the last 10 years: No If all of the above answers are "NO", then may proceed with Cephalosporin use.     Current Outpatient Medications  Medication Sig Dispense Refill  . albuterol (VENTOLIN HFA) 108 (90 Base) MCG/ACT inhaler Inhale 2 puffs into the lungs every 4 (four) hours as needed for wheezing or shortness of breath. 25.5 g 1  . bisoprolol (ZEBETA) 10 MG tablet TAKE 1 TABLET TWICE DAILY 180 tablet 1  . budesonide-formoterol (SYMBICORT) 80-4.5 MCG/ACT inhaler Inhale 2 puffs into the lungs 2 (two) times daily. 3 Inhaler 3  . desonide (DESOWEN) 0.05 % lotion Apply topically 2 (two) times daily. Continue following with dermatologist 59 mL 0  . dextromethorphan (DELSYM) 30 MG/5ML liquid Take by mouth as needed for cough.     . gabapentin (NEURONTIN) 100 MG capsule Take 3 capsules (300 mg total) by mouth at bedtime. 90 capsule 0  . gabapentin (NEURONTIN) 300 MG capsule Take 1 capsule (300 mg total) by mouth at bedtime. 30 capsule 3  . hydrochlorothiazide (HYDRODIURIL) 25 MG tablet Take 1 tablet (25 mg total) by mouth daily. 90 tablet 3  . linaclotide (LINZESS) 145 MCG CAPS capsule Take 1 capsule (145 mcg total) by mouth daily before breakfast. 30 capsule 1  . loratadine (CLARITIN) 10 MG tablet Take 10 mg by mouth daily as needed for allergies.    . predniSONE (DELTASONE) 10 MG tablet Take 0.5 tablets (5 mg total) by mouth daily with breakfast. 90 tablet 3  . RABEprazole (ACIPHEX) 20 MG tablet Take 1 tablet (20 mg total) by mouth daily before breakfast. 90 tablet 3  . simvastatin (ZOCOR) 20 MG tablet Take 1 tablet (20 mg total) by mouth at bedtime. 90 tablet 3  . triamcinolone (KENALOG) 0.025 % cream Apply 1 application topically 2  (two) times daily. 30 g 2  . vitamin C (ASCORBIC ACID) 500 MG tablet Take 500 mg by mouth daily.     No current facility-administered medications for this visit.    REVIEW OF SYSTEMS:  [X]  denotes positive finding, [ ]  denotes negative finding Cardiac  Comments:  Chest pain or chest pressure:    Shortness of breath upon exertion:    Short of breath  when lying flat:    Irregular heart rhythm:        Vascular    Pain in calf, thigh, or hip brought on by ambulation:    Pain in feet at night that wakes you up from your sleep:  x   Blood clot in your veins:    Leg swelling:  x       Pulmonary    Oxygen at home:    Productive cough:     Wheezing:         Neurologic    Sudden weakness in arms or legs:     Sudden numbness in arms or legs:     Sudden onset of difficulty speaking or slurred speech:    Temporary loss of vision in one eye:     Problems with dizziness:         Gastrointestinal    Blood in stool:     Vomited blood:         Genitourinary    Burning when urinating:     Blood in urine:        Psychiatric    Major depression:         Hematologic    Bleeding problems:    Problems with blood clotting too easily:        Skin    Rashes or ulcers:        Constitutional    Fever or chills:      PHYSICAL EXAM: Vitals:   04/18/20 1334  BP: (!) 175/84  Pulse: 86  Resp: 18  Temp: 97.7 F (36.5 C)  TempSrc: Temporal  SpO2: 94%  Weight: 224 lb (101.6 kg)  Height: 5' 5.5" (1.664 m)    GENERAL: The patient is a well-nourished female, in no acute distress. The vital signs are documented above. CARDIOVASCULAR: 2+ radial and 2+ dorsalis pedis pulses bilaterally.  Bilateral lower extremity swelling more so on the left leg than the right.  This does extend onto her foot and toes PULMONARY: There is good air exchange  ABDOMEN: Soft and non-tender  MUSCULOSKELETAL: There are no major deformities or cyanosis. NEUROLOGIC: No focal weakness or paresthesias are  detected. SKIN: There are no ulcers or rashes noted. PSYCHIATRIC: The patient has a normal affect.  DATA:  Noninvasive studies showed no evidence of DVT and no evidence of deep or superficial reflux on the left  MEDICAL ISSUES: I discussed these findings with the patient.  Explained that her arterial study was normal by physical exam and venous by duplex.  This does appear to be lymphedema.  This could be a primary lymphedema.  She does have appointment with her oncologist later this month and will discuss this with them as well.  I did explain that this could potentially be secondary lymphedema either to to prior pelvic surgery or radiation.  I did explain the critical importance of weight loss, elevation when possible with her feet higher than her heart and also graduated compression garments.  She will resume the use of these.  She will see [place] again on an as-needed basis   Rosetta Posner, MD [facility] Tel [phone] Pager [phone]

## 2020-04-24 ENCOUNTER — Telehealth: Payer: Self-pay | Admitting: Family Medicine

## 2020-04-24 NOTE — Telephone Encounter (Signed)
Please advise 

## 2020-04-24 NOTE — Telephone Encounter (Signed)
Pt call and want to know can she take the pain pills that dr.Jordan gave her in the day time .  She state that she is still in pain .

## 2020-04-24 NOTE — Telephone Encounter (Signed)
Message Routed to PCP

## 2020-04-25 NOTE — Telephone Encounter (Signed)
Noted  

## 2020-04-26 ENCOUNTER — Telehealth: Payer: Self-pay | Admitting: Family Medicine

## 2020-04-26 NOTE — Telephone Encounter (Signed)
Pt states she would like to go back to the gabapentin 100mg  3 capsules as needed. She is having pain during the day time and would like to switch back.   Pharmacy: [facility] [place]: [phone]   Pt can be reached at [phone]

## 2020-04-26 NOTE — Telephone Encounter (Signed)
Please advise 

## 2020-04-28 ENCOUNTER — Other Ambulatory Visit: Payer: Self-pay | Admitting: Family Medicine

## 2020-04-28 ENCOUNTER — Telehealth: Payer: Self-pay | Admitting: Family Medicine

## 2020-04-28 DIAGNOSIS — M79605 Pain in left leg: Secondary | ICD-10-CM

## 2020-04-28 MED ORDER — GABAPENTIN 100 MG PO CAPS: 100.0000 mg | ORAL_CAPSULE | Freq: Three times a day (TID) | ORAL | 0 refills | Status: DC

## 2020-04-28 NOTE — Telephone Encounter (Signed)
Rx has been sent. Thanks, BJ

## 2020-04-28 NOTE — Telephone Encounter (Signed)
Pt aware.

## 2020-04-28 NOTE — Telephone Encounter (Signed)
Message sent to Dr. Jordan for review. 

## 2020-04-28 NOTE — Telephone Encounter (Signed)
Pt is aware.  

## 2020-04-28 NOTE — Telephone Encounter (Signed)
Prescription for gabapentin 100 mg to continue 3 times daily as needed was sent to her pharmacy. Thanks, BJ

## 2020-04-28 NOTE — Telephone Encounter (Signed)
Patient needs prescription to be reworded- Dr [name] changed prescription back to what pt was taking before.  It supposed to be 1 capsule 3 times a day.  Patient needs this today--she is going on vacation in the morning.

## 2020-04-28 NOTE — Telephone Encounter (Signed)
Noted  

## 2020-05-08 ENCOUNTER — Other Ambulatory Visit: Payer: Self-pay

## 2020-05-09 ENCOUNTER — Ambulatory Visit: Payer: Medicare HMO | Admitting: Family Medicine

## 2020-05-09 ENCOUNTER — Other Ambulatory Visit: Payer: Self-pay

## 2020-05-10 ENCOUNTER — Ambulatory Visit (INDEPENDENT_AMBULATORY_CARE_PROVIDER_SITE_OTHER)
Admission: RE | Admit: 2020-05-10 | Discharge: 2020-05-10 | Disposition: A | Discharge status: Home / Self Care | Payer: Medicare HMO | Source: Ambulatory Visit | Attending: Family Medicine | Admitting: Family Medicine

## 2020-05-10 ENCOUNTER — Ambulatory Visit (INDEPENDENT_AMBULATORY_CARE_PROVIDER_SITE_OTHER): Payer: Medicare HMO | Admitting: Family Medicine

## 2020-05-10 ENCOUNTER — Other Ambulatory Visit: Payer: Self-pay

## 2020-05-10 ENCOUNTER — Encounter: Payer: Self-pay | Admitting: Family Medicine

## 2020-05-10 ENCOUNTER — Inpatient Hospital Stay (HOSPITAL_BASED_OUTPATIENT_CLINIC_OR_DEPARTMENT_OTHER): Payer: Medicare HMO | Admitting: Gynecologic Oncology

## 2020-05-10 ENCOUNTER — Other Ambulatory Visit: Payer: Self-pay | Admitting: Oncology

## 2020-05-10 ENCOUNTER — Encounter: Payer: Self-pay | Admitting: Gynecologic Oncology

## 2020-05-10 ENCOUNTER — Inpatient Hospital Stay: Payer: Medicare HMO | Attending: Gynecologic Oncology

## 2020-05-10 VITALS — BP 195/76 | HR 67 | Temp 98.6°F | Resp 17 | Ht 65.5 in | Wt 221.6 lb

## 2020-05-10 VITALS — BP 130/74 | HR 72 | Temp 97.8°F | Wt 216.7 lb

## 2020-05-10 DIAGNOSIS — G893 Neoplasm related pain (acute) (chronic): Secondary | ICD-10-CM | POA: Insufficient documentation

## 2020-05-10 DIAGNOSIS — M79605 Pain in left leg: Secondary | ICD-10-CM

## 2020-05-10 DIAGNOSIS — Z90722 Acquired absence of ovaries, bilateral: Secondary | ICD-10-CM

## 2020-05-10 DIAGNOSIS — Z923 Personal history of irradiation: Secondary | ICD-10-CM | POA: Diagnosis not present

## 2020-05-10 DIAGNOSIS — K5909 Other constipation: Secondary | ICD-10-CM | POA: Insufficient documentation

## 2020-05-10 DIAGNOSIS — Z90710 Acquired absence of both cervix and uterus: Secondary | ICD-10-CM | POA: Insufficient documentation

## 2020-05-10 DIAGNOSIS — D869 Sarcoidosis, unspecified: Secondary | ICD-10-CM | POA: Insufficient documentation

## 2020-05-10 DIAGNOSIS — C541 Malignant neoplasm of endometrium: Secondary | ICD-10-CM

## 2020-05-10 DIAGNOSIS — Z7951 Long term (current) use of inhaled steroids: Secondary | ICD-10-CM | POA: Insufficient documentation

## 2020-05-10 DIAGNOSIS — Z7952 Long term (current) use of systemic steroids: Secondary | ICD-10-CM | POA: Diagnosis not present

## 2020-05-10 DIAGNOSIS — Z87891 Personal history of nicotine dependence: Secondary | ICD-10-CM | POA: Diagnosis not present

## 2020-05-10 DIAGNOSIS — N898 Other specified noninflammatory disorders of vagina: Secondary | ICD-10-CM | POA: Diagnosis not present

## 2020-05-10 DIAGNOSIS — M25552 Pain in left hip: Secondary | ICD-10-CM

## 2020-05-10 DIAGNOSIS — L89899 Pressure ulcer of other site, unspecified stage: Secondary | ICD-10-CM | POA: Insufficient documentation

## 2020-05-10 DIAGNOSIS — R600 Localized edema: Secondary | ICD-10-CM

## 2020-05-10 DIAGNOSIS — N760 Acute vaginitis: Secondary | ICD-10-CM | POA: Diagnosis not present

## 2020-05-10 DIAGNOSIS — M79662 Pain in left lower leg: Secondary | ICD-10-CM | POA: Diagnosis not present

## 2020-05-10 LAB — BASIC METABOLIC PANEL
Anion gap: 9 (ref 5–15)
BUN: 21 mg/dL (ref 8–23)
CO2: 30 mmol/L (ref 22–32)
Calcium: 10 mg/dL (ref 8.9–10.3)
Chloride: 103 mmol/L (ref 98–111)
Creatinine, Ser: 0.9 mg/dL (ref 0.44–1.00)
GFR calc Af Amer: >60 mL/min (ref >60)
GFR calc non Af Amer: >60 mL/min (ref >60)
Glucose, Bld: 108 mg/dL — ABNORMAL HIGH (ref 70–99)
Potassium: 3.7 mmol/L (ref 3.5–5.1)
Sodium: 142 mmol/L (ref 135–145)

## 2020-05-10 NOTE — Progress Notes (Signed)
Follow-up Note: Gyn-Onc  Consult was requested by Dr. Vanessa Kick for the evaluation of [name] 64 y.o. female  CC:  Chief Complaint  Patient presents with  . Endometrial cancer [facility])    Follow Up    Assessment/Plan:  [name]  is a 65 y.o.  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 (MSI stable, MMR normal. ER/PR negative). She is s/p adjuvant brachytherapy for high risk factors, completed October, 2019.  I am ordering CT imaging of the abd/pelvis and chest and PET (as prior CT imaging showed "prominent" nodes, however these may be positive on PET given her presentation symptoms).   I am ordering a consultation with Dr Sondra Come and Dr Alvy Bimler. She will likely require salvage whole pelvic RT and chemotherapy with carb/taxol.  Her case will be discussed at multidisciplinary tumor board conference.   HPI: [name] is a 64 year old parous woman who is seen in consultation at the request of Dr Harrington Challenger for endometrial cancer.   Patient began experiencing postmenopausal bleeding in May 2019.  Prior to that time she had been amenorrheic since receiving whole pelvic radiation in 2005 as neoadjuvant treatment with chemotherapy for locally advanced anal cancer.  Following radiation she had a good response and required a local resection at the anal canal but did not require an APR colostomy.  Since receiving that radiation treatment she has had ongoing toxicities from radiation including loose stools.  Her other significant medical histories include obesity.  She also has a significant medical history of sarcoidosis with particular impact upon her pulmonary function.  She takes daily prednisone 5 mg for this and additional inhaled agents.  She sees Dr. Melvyn Novas for pulmonology care.  She reports shortness of breath with ambulation for more than 20 m and shortness of breath upon stairclimbing.  She cannot comfortably lie flat due to shortness of  breath.  She does not use oxygen therapy.  Her any prior abdominal surgical history is a laparoscopic cholecystectomy.  She has however had 2 prior cesarean sections.  On 06/25/18 she underwent robotic assisted total hysterectomy, BSO, SLN biopsy. Intraoperative findings were unremarkable. Final pathology revealed grade 1 endometrioid adenocarcinoma, 2.4cm tumor, deep myometrial invasion, LVSI present. There was ITC's in bilateral SLN's. MMR normal, MSI stable. ER/PR negative. She was determined to have high risk factors for recurrence and adjuvant therapy was recommended in accordance to NCCN guidelines.  She has already received whole pelvic RT for her prior diagnosis of anal cancer in 2005.  Her case was reviewed by multidisciplinary conference, and prior radiation fields were reviewed. She was recommended to be considered for vaginal brachytherapy.   Between August 19, 2018 and September 17, 2018 she received brachii therapy, high-dose-rate, with iridium 192, to the vaginal cuff.  30 Gy was delivered in 5 fractions of 6 Gy.  She tolerated therapy well.  Interval Hx: In February 2021 she began experiencing suprapubic pain and discomfort.  This prompted a CT scan of the abdomen and pelvis to be ordered on January 19, 2020.  This revealed unchanged prominent retroperitoneal and iliac lymph nodes with an index right iliac lymph node measuring 1.3 x 1.1 cm.  The CT scan reported no new findings suspicious for metastatic disease in the abdomen or pelvis.  The patient began experiencing significant left lower extremity edema in April 2021.  This was worked up in May 2021 with a Doppler of the left lower extremity which was negative for DVT.  She also reported progression of symptoms of suprapubic discomfort left groin pain and vaginal watery drainage.  Current Meds:  Outpatient Encounter Medications as of 05/10/2020  Medication Sig  . albuterol (VENTOLIN HFA) 108 (90 Base) MCG/ACT inhaler Inhale 2  puffs into the lungs every 4 (four) hours as needed for wheezing or shortness of breath.  . bisoprolol (ZEBETA) 10 MG tablet TAKE 1 TABLET TWICE DAILY  . budesonide-formoterol (SYMBICORT) 80-4.5 MCG/ACT inhaler Inhale 2 puffs into the lungs 2 (two) times daily.  Marland Kitchen desonide (DESOWEN) 0.05 % lotion Apply topically 2 (two) times daily. Continue following with dermatologist  . dextromethorphan (DELSYM) 30 MG/5ML liquid Take by mouth as needed for cough.   . gabapentin (NEURONTIN) 100 MG capsule Take 1 capsule (100 mg total) by mouth 3 (three) times daily.  . hydrochlorothiazide (HYDRODIURIL) 25 MG tablet Take 1 tablet (25 mg total) by mouth daily.  Marland Kitchen linaclotide (LINZESS) 145 MCG CAPS capsule Take 1 capsule (145 mcg total) by mouth daily before breakfast.  . loratadine (CLARITIN) 10 MG tablet Take 10 mg by mouth daily as needed for allergies.  . predniSONE (DELTASONE) 10 MG tablet Take 0.5 tablets (5 mg total) by mouth daily with breakfast.  . RABEprazole (ACIPHEX) 20 MG tablet Take 1 tablet (20 mg total) by mouth daily before breakfast.  . simvastatin (ZOCOR) 20 MG tablet Take 1 tablet (20 mg total) by mouth at bedtime.  . triamcinolone (KENALOG) 0.025 % cream Apply 1 application topically 2 (two) times daily.  . vitamin C (ASCORBIC ACID) 500 MG tablet Take 500 mg by mouth daily.   No facility-administered encounter medications on file as of 05/10/2020.    Allergy:  Allergies  Allergen Reactions  . Shellfish Allergy Hives, Itching and Swelling  . Penicillins Hives and Itching    Has patient had a PCN reaction causing immediate rash, facial/tongue/throat swelling, SOB or lightheadedness with hypotension: Yes Has patient had a PCN reaction causing severe rash involving mucus membranes or skin necrosis: Yes Has patient had a PCN reaction that required hospitalization: Yes Has patient had a PCN reaction occurring within the last 10 years: No If all of the above answers are "NO", then may proceed  with Cephalosporin use.     Social Hx:   Social History   Socioeconomic History  . Marital status: Single    Spouse name: Not on file  . Number of children: 2  . Years of education: Not on file  . Highest education level: Not on file  Occupational History  . Occupation: retired    Fish farm manager: [employer]  Tobacco Use  . Smoking status: Former Smoker    Packs/day: 0.50    Years: 1.00    Pack years: 0.50    Types: Cigarettes    Quit date: 12/16/1993    Years since quitting: 26.4  . Smokeless tobacco: Never Used  Substance and Sexual Activity  . Alcohol use: Yes    Comment: occasional  . Drug use: No  . Sexual activity: Never  Other Topics Concern  . Not on file  Social History Narrative  . Not on file   Social Determinants of Health   Financial Resource Strain:   . Difficulty of Paying Living Expenses:   Food Insecurity:   . Worried About Charity fundraiser in the Last Year:   . Arboriculturist in the Last Year:   Transportation Needs:   . Film/video editor (Medical):   Marland Kitchen Lack of Transportation (Non-Medical):  Physical Activity:   . Days of Exercise per Week:   . Minutes of Exercise per Session:   Stress:   . Feeling of Stress :   Social Connections:   . Frequency of Communication with Friends and Family:   . Frequency of Social Gatherings with Friends and Family:   . Attends Religious Services:   . Active Member of Clubs or Organizations:   . Attends Archivist Meetings:   Marland Kitchen Marital Status:   Intimate Partner Violence:   . Fear of Current or Ex-Partner:   . Emotionally Abused:   Marland Kitchen Physically Abused:   . Sexually Abused:     Past Surgical Hx:  Past Surgical History:  Procedure Laterality Date  . BRONCHOSCOPY  01/22/2008   w/ Lavage and bronchial bx  . CARDIOVASCULAR STRESS TEST  06/27/2006   normal nuclear study w/ no ischemia/  normal LV function and wall motion , ef 61%  . CARPAL TUNNEL RELEASE Right 01/17/2016   and finger trigger  release  . CLEFT LIP REPAIR    . CO2 LASER ABLATION VULVA    . EUA/  EXCISIONAL MULTIPLE PERINEAL/ANAL BX'S/  SIGMOIDSCOPY/  PORT-A-CATH PLACEMENT  03/27/2004   Local excision - Dr Lennie Hummer  . LAPAROSCOPIC CHOLECYSTECTOMY  08/14/2008  . MULTIPLE ANAL BX'S  10/10/2006  . RELEASE A1 PULLEY THUMB Bilateral left 12-17-2006/  right 09-05-2009  . REMOVAL PORT-A-CATH/  EUA/  MULTIPLE ANAL BX'S  10/16/2004  . ROBOTIC ASSISTED TOTAL HYSTERECTOMY WITH BILATERAL SALPINGO OOPHERECTOMY Bilateral 06/25/2018   Procedure: XI ROBOTIC ASSISTED TOTAL HYSTERECTOMY WITH BILATERAL SALPINGO OOPHORECTOMY;  Surgeon: Everitt Amber, MD;  Location: [facility];  Service: Gynecology;  Laterality: Bilateral;  . SENTINEL NODE BIOPSY N/A 06/25/2018   Procedure: SENTINEL NODE BIOPSY;  Surgeon: Everitt Amber, MD;  Location: [facility];  Service: Gynecology;  Laterality: N/A;  . TENNIS ELBOW RELEASE/NIRSCHEL PROCEDURE Right 11/28/2016   Procedure: RIGHT ELBOW ULNER NERVE RELEASE AND OR TRANSPOSITION;  Surgeon: Iran Planas, MD;  Location: [place];  Service: Orthopedics;  Laterality: Right;    Past Medical Hx:  Past Medical History:  Diagnosis Date  . Bifid uvula   . Blood transfusion without reported diagnosis   . GERD (gastroesophageal reflux disease)   . History of cervical dysplasia   . History of chemotherapy    06/ 2005  . History of cleft lip    CORRECTED  . History of condyloma acuminatum    PERIANAL  . History of radiation therapy    completed 06/ 2005  . History of vulvar dysplasia   . Hypertension   . 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 Pulmonary nodule, left   . Pulmonary sarcoidosis ([place]) since [place]  . Sarcoid uveitis of left eye goes to [facility]--  currently no issues per pt 11-27-2016  . 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  ChemoXRT 2005 Dr Collier Salina Rubin/[facility]  . Upper airway  cough syndrome   . Wears glasses     Past Gynecological History:  Cervical dysplasia Patient's last menstrual period was 12/17/2003.  Family Hx:  Family History  Problem Relation Age of Onset  . Allergies Mother   . Asthma Mother   . Lung cancer Mother   . Allergies Sister   . Allergies Brother   . Asthma Brother   . Asthma Sister   . Sarcoidosis  Neg Hx   . Colon cancer Neg Hx   . Liver cancer Neg Hx   . Stomach cancer Neg Hx   . Esophageal cancer Neg Hx   . Rectal cancer Neg Hx     Review of Systems:  Constitutional  Feels well,    ENT Normal appearing ears and nares bilaterally Skin/Breast  No rash, sores, jaundice, itching, dryness Cardiovascular  No chest pain or edema  Pulmonary  no shortness of breath  Gastro Intestinal  No nausea, vomitting.. No bright red blood per rectum, no abdominal pain, change in bowel movement, or constipation. + diarrhea Genito Urinary  No frequency, urgency, dysuria, + discharge/spotting Musculo Skeletal  + pelvic pain + left groin pain + LLE edema Neurologic  No weakness, numbness, change in gait,  Psychology  No depression, anxiety, insomnia.   Vitals:  Blood pressure (!) 195/76, pulse 67, temperature 98.6 F (37 C), temperature source Oral, resp. rate 17, height 5' 5.5" (1.664 m), weight 221 lb 9.6 oz (100.5 kg), last menstrual period 12/17/2003, SpO2 98 %.  Physical Exam: WD in NAD Neck  Supple NROM, without any enlargements.  Lymph Node Survey No cervical supraclavicular adenopathy, There is firm induration in the left suprapubic region and left groin (fixed). No discrete groin masses.  Cardiovascular  Pulse normal rate, regularity and rhythm. S1 and S2 normal.  Lungs  Clear to auscultation bilateraly, without wheezes/crackles/rhonchi. Good air movement.  Skin  Breakdown (dressed) on left buttock skin Psychiatry  Alert and oriented to person, place, and time  Abdomen  Normoactive bowel sounds, abdomen soft,  non-tender and obese without evidence of hernia. soft incisions. There is firm induration in the left suprapubic region and left groin (fixed). No discrete groin masses.  Back No CVA tenderness Genito Urinary  Vaginal cuff puckered in the left fornix with a 1.5cm fleshy mass at the apex (flush with vaginal wall). On bimanual examination this is the tip of a 8cm pelvic mass extending towards the left pelvic sidewall (fixed) and inguinal region consistent with recurrent left sidewall disease.  Rectal  No rectal involvement of the mass, no visible anal lesions Extremities  No bilateral cyanosis, clubbing or edema.  Procedure Note:  Preop Dx: vaginal mass, hx of endometrial cancer Postop Dx: same Procedure: vaginal biopsy Surgeon: Dorann Ou, MD EBL: <5cc Specimens: left vaginal fornix Complications: none Procedure Details: The patient provided verbal consent and a verbal timeout was performed.  The Rainbow Tischler forcep was used to sample the left vaginal sidewall mass.  Due to the puckering in the vaginal fornix and the mass being flush with the vaginal wall, it was difficult to obtain a large piece of tissue, however small fragments were able to be retrieved.  There is minimal bleeding from the biopsy site.  The patient tolerated procedure well.  The specimen was sent for histopathology.  Thereasa Solo, MD  05/10/2020, 3:13 PM

## 2020-05-10 NOTE — Patient Instructions (Signed)
On your exam today Dr Denman George found a mass growing in the left upper vagina that was extending deep into the pelvic tissues on the left towards the left groin.  This is likely causing your pelvic pain and the swelling in your left leg.  On examination it appeared to be consistent with a recurrence, metastasis, of your previous endometrial cancer.  Dr. Denman George took a biopsy today to evaluate this.  She will order a scan to evaluate for the extent of disease in the pelvis and chest and abdomen.  This will need to be treated with a combination of radiation and chemotherapy.  We will let her know the details of this after the scan has told us the extent of the disease.  Dr. Serita Grit office will set you up to see Dr. Sondra Come and Dr Alvy Bimler to discuss these therapies further.

## 2020-05-10 NOTE — Progress Notes (Signed)
Subjective:     Patient ID: [name], female   DOB: 1956/01/25, 64 y.o.   MRN: [id number]  HPI [name] is seen with persistent left anterior leg pain about mid tibia.  No reported injury.  She was seen by primary and sent for Doppler earlier this month which showed no DVT.  She describes achy pain which is painful with ambulation but also sometimes even at rest.  She was tried on gabapentin 100 mg 3 times daily but this did not help.  She also tried heat and ice as well as Advil and none of those helped.  She does feel that Advil helps some swelling to go down.  Her pain is over fairly localized area mid tibia.  This has limited her walking some.  No calf pain.  No appetite or weight changes.  No adenopathy.  Past Medical History:  Diagnosis Date  . Bifid uvula   . Blood transfusion without reported diagnosis   . GERD (gastroesophageal reflux disease)   . History of cervical dysplasia   . History of chemotherapy    06/ 2005  . History of cleft lip    CORRECTED  . History of condyloma acuminatum    PERIANAL  . History of radiation therapy    completed 06/ 2005  . History of vulvar dysplasia   . Hypertension   . 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 Pulmonary nodule, left   . Pulmonary sarcoidosis ([place]) since [place]  . S[facility] eye [facility]--  currently no issues per pt 11-27-2016  . 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  ChemoXRT 2005 Dr Collier Salina Rubin/[facility]  . Upper airway cough syndrome   . Wears glasses    Past Surgical History:  Procedure Laterality Date  . BRONCHOSCOPY  01/22/2008   w/ Lavage and bronchial bx  . CARDIOVASCULAR STRESS TEST  06/27/2006   normal nuclear study w/ no ischemia/  normal LV function and wall motion , ef 61%  . CARPAL TUNNEL RELEASE Right 01/17/2016   and finger trigger release  . CLEFT LIP REPAIR    . CO2 LASER  ABLATION VULVA    . EUA/  EXCISIONAL MULTIPLE PERINEAL/ANAL BX'S/  SIGMOIDSCOPY/  PORT-A-CATH PLACEMENT  03/27/2004   Local excision - Dr Lennie Hummer  . LAPAROSCOPIC CHOLECYSTECTOMY  08/14/2008  . MULTIPLE ANAL BX'S  10/10/2006  . RELEASE A1 PULLEY THUMB Bilateral left 12-17-2006/  right 09-05-2009  . REMOVAL PORT-A-CATH/  EUA/  MULTIPLE ANAL BX'S  10/16/2004  . ROBOTIC ASSISTED TOTAL HYSTERECTOMY WITH BILATERAL SALPINGO OOPHERECTOMY Bilateral 06/25/2018   Procedure: XI ROBOTIC ASSISTED TOTAL HYSTERECTOMY WITH BILATERAL SALPINGO OOPHORECTOMY;  Surgeon: Everitt Amber, MD;  [facility];  Service: Gynecology;  Laterality: Bilateral;  . SENTINEL NODE BIOPSY N/A 06/25/2018   Procedure: SENTINEL NODE BIOPSY;  Surgeon: Everitt Amber, MD;  [facility];  Service: Gynecology;  Laterality: N/A;  . TENNIS ELBOW RELEASE/NIRSCHEL PROCEDURE Right 11/28/2016   Procedure: RIGHT ELBOW ULNER NERVE RELEASE AND OR TRANSPOSITION;  Surgeon: Iran Planas, MD;  Location: [place];  Service: Orthopedics;  Laterality: Right;    reports that she quit smoking about 26 years ago. Her smoking use included cigarettes. She has a 0.50 pack-year smoking history. She has never used smokeless tobacco. She reports current alcohol use. She reports that she does not use drugs.  family history includes Allergies in her brother, mother, and sister; Asthma in her brother, mother, and sister; Lung cancer in her mother. Allergies  Allergen Reactions  . Shellfish Allergy Hives, Itching and Swelling  . Penicillins Hives and Itching    Has patient had a PCN reaction causing immediate rash, facial/tongue/throat swelling, SOB or lightheadedness with hypotension: Yes Has patient had a PCN reaction causing severe rash involving mucus membranes or skin necrosis: Yes Has patient had a PCN reaction that required hospitalization: Yes Has patient had a PCN reaction occurring within the last 10 years: No If all of the above  answers are "NO", then may proceed with Cephalosporin use.      Review of Systems  Constitutional: Negative for chills and fever.  Respiratory: Negative for shortness of breath.   Cardiovascular: Negative for chest pain.  Hematological: Negative for adenopathy.       Objective:   Physical Exam Vitals reviewed.  Constitutional:      Appearance: Normal appearance.  Cardiovascular:     Rate and Rhythm: Normal rate and regular rhythm.  Pulmonary:     Effort: Pulmonary effort is normal.     Breath sounds: Normal breath sounds.  Musculoskeletal:     Comments: Left leg- tender  Over mid tibia anteriorly over an area about along medial aspect of tibia.  No warmth or erythema.   Neurological:     Mental Status: She is alert.        Assessment:     Left tibia pain along the mid/medial aspect.  This may possibly represent some shin splints but would recommend x-rays to further evaluate    Plan:     -Check left tib-fib films. -If normal, recommend over-the-counter Voltaren gel 3-4 times daily  Eulas Post MD [facility]

## 2020-05-10 NOTE — Patient Instructions (Signed)
Go for X-ray at [facility]- in the basement  IF x-rays normal, would try OTC Voltaren gel 3-4 times daily.

## 2020-05-11 ENCOUNTER — Encounter: Payer: Self-pay | Admitting: Oncology

## 2020-05-11 ENCOUNTER — Encounter: Payer: Self-pay | Admitting: Hematology and Oncology

## 2020-05-11 ENCOUNTER — Inpatient Hospital Stay: Payer: Medicare HMO | Admitting: Hematology and Oncology

## 2020-05-11 ENCOUNTER — Telehealth: Payer: Self-pay

## 2020-05-11 ENCOUNTER — Other Ambulatory Visit: Payer: Self-pay | Admitting: Internal Medicine

## 2020-05-11 ENCOUNTER — Other Ambulatory Visit: Payer: Self-pay

## 2020-05-11 DIAGNOSIS — Z85048 Personal history of other malignant neoplasm of rectum, rectosigmoid junction, and anus: Secondary | ICD-10-CM

## 2020-05-11 DIAGNOSIS — D869 Sarcoidosis, unspecified: Secondary | ICD-10-CM

## 2020-05-11 DIAGNOSIS — C541 Malignant neoplasm of endometrium: Secondary | ICD-10-CM

## 2020-05-11 DIAGNOSIS — Z90722 Acquired absence of ovaries, bilateral: Secondary | ICD-10-CM | POA: Diagnosis not present

## 2020-05-11 DIAGNOSIS — Z87891 Personal history of nicotine dependence: Secondary | ICD-10-CM | POA: Diagnosis not present

## 2020-05-11 DIAGNOSIS — N898 Other specified noninflammatory disorders of vagina: Secondary | ICD-10-CM | POA: Diagnosis not present

## 2020-05-11 DIAGNOSIS — K5909 Other constipation: Secondary | ICD-10-CM | POA: Diagnosis not present

## 2020-05-11 DIAGNOSIS — Z923 Personal history of irradiation: Secondary | ICD-10-CM | POA: Diagnosis not present

## 2020-05-11 DIAGNOSIS — R600 Localized edema: Secondary | ICD-10-CM | POA: Diagnosis not present

## 2020-05-11 DIAGNOSIS — G893 Neoplasm related pain (acute) (chronic): Secondary | ICD-10-CM

## 2020-05-11 DIAGNOSIS — Z7952 Long term (current) use of systemic steroids: Secondary | ICD-10-CM | POA: Diagnosis not present

## 2020-05-11 DIAGNOSIS — Z7951 Long term (current) use of inhaled steroids: Secondary | ICD-10-CM | POA: Diagnosis not present

## 2020-05-11 DIAGNOSIS — Z90710 Acquired absence of both cervix and uterus: Secondary | ICD-10-CM | POA: Diagnosis not present

## 2020-05-11 MED ORDER — MORPHINE SULFATE 15 MG PO TABS: 15.0000 mg | ORAL_TABLET | ORAL | 0 refills | Status: DC | PRN

## 2020-05-11 MED FILL — MORPHINE SULFATE 15 MG TABS: 15 | 5 days supply | Qty: 30 | Fill #0

## 2020-05-11 NOTE — Assessment & Plan Note (Signed)
Her overall presentation with abdominal pain, unilateral leg swelling and abnormal GYN examination yesterday are all consistent with possible cancer recurrence Biopsies pending PET CT scan and CT scan are pending We discussed approach for recurrent uterine cancer The mainstay of treatment, in general, would involve systemic chemotherapy with combination carboplatin and paclitaxel Alternatively, if the biopsy confirmed recurrent endometrioid adenocarcinoma and if her disease burden is low, one could consider antiestrogen therapy in combination with everolimus Her third option would be consideration for pembrolizumab plus lenvatinib.  However, given her chronic corticosteroid use for sarcoidosis, this approach is not recommended as chronic prednisone therapy could interfere with mechanism of action of pembrolizumab We discussed briefly the risk and benefits of each options She would likely benefit from port placement and repeat chemo education class I will set up follow-up appointment once we have confirmed the time and the date of her imaging studies

## 2020-05-11 NOTE — Telephone Encounter (Signed)
Called and scheduled appt for today at 10 am with Dr. Alvy Bimler. Ask her to arrive at 0945. She verbalized understanding.

## 2020-05-11 NOTE — Assessment & Plan Note (Signed)
She has poorly controlled pain I recommend holding of gabapentin and switch her to immediate release morphine I will call her next week to assess pain control We discussed the risk of nausea, sedation and constipation with immediate release morphine

## 2020-05-11 NOTE — Telephone Encounter (Signed)
Attempted to call. Mailbox full. Unable to leave a message. Attempting to make appt with Dr. Alvy Bimler.

## 2020-05-11 NOTE — Assessment & Plan Note (Signed)
She has recent venous Doppler ultrasound work-up which confirmed no evidence of DVT This is likely due to compression of venous flow within her abdomen

## 2020-05-11 NOTE — Assessment & Plan Note (Signed)
She will continue prednisone therapy as directed

## 2020-05-11 NOTE — Assessment & Plan Note (Signed)
We discussed the importance of aggressive laxative therapy 

## 2020-05-11 NOTE — Progress Notes (Signed)
[place] NOTE  Patient Care Team: [name], [name], MD as PCP - General (Family Medicine)  ASSESSMENT & PLAN:  Endometrial cancer [facility]) Her overall presentation with abdominal pain, unilateral leg swelling and abnormal GYN examination yesterday are all consistent with possible cancer recurrence Biopsies pending PET CT scan and CT scan are pending We discussed approach for recurrent uterine cancer The mainstay of treatment, in general, would involve systemic chemotherapy with combination carboplatin and paclitaxel Alternatively, if the biopsy confirmed recurrent endometrioid adenocarcinoma and if her disease burden is low, one could consider antiestrogen therapy in combination with everolimus Her third option would be consideration for pembrolizumab plus lenvatinib.  However, given her chronic corticosteroid use for sarcoidosis, this approach is not recommended as chronic prednisone therapy could interfere with mechanism of action of pembrolizumab We discussed briefly the risk and benefits of each options She would likely benefit from port placement and repeat chemo education class I will set up follow-up appointment once we have confirmed the time and the date of her imaging studies  History of anal cancer She has no recurrence in this regard  Sarcoidosis ([place]) She will continue prednisone therapy as directed  Cancer associated pain She has poorly controlled pain I recommend holding of gabapentin and switch her to immediate release morphine I will call her next week to assess pain control We discussed the risk of nausea, sedation and constipation with immediate release morphine  Other constipation We discussed the importance of aggressive laxative therapy  Bilateral lower extremity edema She has recent venous Doppler ultrasound work-up which confirmed no evidence of DVT This is likely due to compression of venous flow within her abdomen   No orders of  the defined types were placed in this encounter.   The total time spent in the appointment was 60 minutes encounter with patients including review of chart and various tests results, discussions about plan of care and coordination of care plan   All questions were answered. The patient knows to call the clinic with any problems, questions or concerns. No barriers to learning was detected.  [name], MD 5/27/20215:01 PM  CHIEF COMPLAINTS/PURPOSE OF CONSULTATION:  Recurrent uterine cancer, remote history of anal cancer, for further management  HISTORY OF PRESENTING ILLNESS:  [name] 64 y.o. female is here because of recent physical findings suspicious for recurrent uterine cancer She is here with her daughter, [name] The patient was diagnosed with anal cancer over 15 years ago, treated with radiation therapy and chemotherapy with complete response In 2019, she presented with abnormal vaginal bleeding and was diagnosed with uterine cancer After surgery, she received adjuvant chemotherapy with regular follow-up Several months ago, she started to have left leg swelling more so compared to the right The leg swelling is also associated with pain.  She has limited mobility because of this.  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  She has been on chronic prednisone therapy for many years  I have reviewed her chart and materials related to her cancer extensively and collaborated history with the patient. Summary  of oncologic history is as  follows: Oncology History  Endometrial cancer ([place])  06/15/2018 Surgery   Surgeon: Donaciano Eva     Operation: Robotic-assisted laparoscopic total hysterectomy with bilateral salpingoophorectomy, SLN biopsy     Operative Findings:  : 6cm uterus, normal tubes and ovaries, omentum adherent to the anterior abdominal wall and uterine fundus. No suspicious nodes.   07/20/2018 Imaging   1. Surgical changes from recent hysterectomy. No findings suspicious for residual tumor or adenopathy in the pelvis. 2. No CT findings to suggest omental, peritoneal surface or solid organ metastatic disease in the abdomen. 3. Changes consistent with known sarcoidosis in the chest with bulky calcified mediastinal and hilar lymph nodes and interstitial lung disease. I do not see any definite CT findings to suggest metastatic disease involving the chest.   08/19/2018 - 09/17/2018 Radiation Therapy   08/19/18 - 09/17/18: Vaginal Cuff, 3 cm cylinder with treatment length of 3 cm / 30 Gy delivered in 5 fractions of 6 Gy, Brachytherapy / HDR, Iridium-192   05/2005: Left Chest Wall keloid   04/2004 - 05/2004: Anus, pelvis /inguinal ( Dr. Tammi Klippel)   01/19/2020 Imaging   1. No definite CT findings of the abdomen or pelvis to explain pain. 2. The sigmoid colon and rectum are decompressed although slightly thickened appearing, suggestive of nonspecific infectious, inflammatory, or ischemic colitis. Correlate for referable clinical symptoms. 3. There is trace, nonspecific free fluid in the low pelvis, which may be reactive, although would be difficult to distinguish from postoperative and post treatment soft tissue change in the pelvis given history of hysterectomy and rectal cancer. 4. Unchanged prominent retroperitoneal and iliac lymph nodes. No new findings suspicious for metastatic disease in the abdomen or pelvis. 5. Bibasilar pulmonary fibrosis and mediastinal and hilar lymphadenopathy, better evaluated by prior CT chest and  consistent with patient diagnosis of sarcoidosis.       04/18/2020 Imaging   Venous Doppler Left:  - No evidence of deep vein thrombosis seen in the left lower extremity, from the common femoral through the popliteal veins.  - No evidence of superficial venous thrombosis in the left lower extremity.  - There is no evidence of venous reflux seen in the left lower extremity.  - Rouleax flow noted in the superficial system.      MEDICAL HISTORY:  Past Medical History:  Diagnosis Date  . Bifid uvula   . Blood transfusion without reported diagnosis   . GERD (gastroesophageal reflux disease)   . History of cervical dysplasia   . History of chemotherapy    06/ 2005  . History of cleft lip    CORRECTED  . History of condyloma acuminatum    PERIANAL  . History of radiation therapy    completed 06/ 2005  . History of vulvar dysplasia   . Hypertension   . 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 Pulmonary nodule, left   . 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  ChemoXRT 2005 Dr Collier [place]ina Rubin/[facility]  . Upper airway cough syndrome   . Wears glasses     SURGICAL HISTORY: Past Surgical History:  Procedure Laterality Date  . BRONCHOSCOPY  01/22/2008   w/ Lavage and bronchial bx  . CARDIOVASCULAR  STRESS TEST  06/27/2006   normal nuclear study w/ no ischemia/  normal LV function and wall motion , ef 61%  . CARPAL TUNNEL RELEASE Right 01/17/2016   and finger trigger release  . CLEFT LIP REPAIR    . CO2 LASER ABLATION VULVA    . EUA/  EXCISIONAL MULTIPLE PERINEAL/ANAL BX'S/  SIGMOIDSCOPY/  PORT-A-CATH PLACEMENT  03/27/2004   Local excision - Dr Lennie Hummer  . LAPAROSCOPIC CHOLECYSTECTOMY  08/14/2008  . MULTIPLE ANAL BX'S  10/10/2006  . RELEASE A1  PULLEY THUMB Bilateral left 12-17-2006/  right 09-05-2009  . REMOVAL PORT-A-CATH/  EUA/  MULTIPLE ANAL BX'S  10/16/2004  . ROBOTIC ASSISTED TOTAL HYSTERECTOMY WITH BILATERAL SALPINGO OOPHERECTOMY Bilateral 06/25/2018   Procedure: XI ROBOTIC ASSISTED TOTAL HYSTERECTOMY WITH BILATERAL SALPINGO OOPHORECTOMY;  Surgeon: Everitt Amber, MD;  Location: [facility];  Service: Gynecology;  Laterality: Bilateral;  . SENTINEL NODE BIOPSY N/A 06/25/2018   Procedure: SENTINEL NODE BIOPSY;  Surgeon: Everitt Amber, MD;  Location: [facility];  Service: Gynecology;  Laterality: N/A;  . TENNIS ELBOW RELEASE/NIRSCHEL PROCEDURE Right 11/28/2016   Procedure: RIGHT ELBOW ULNER NERVE RELEASE AND OR TRANSPOSITION;  Surgeon: Iran Planas, MD;  Location: [facility];  Service: Orthopedics;  Laterality: Right;    SOCIAL HISTORY: Social History   Socioeconomic History  . Marital status: Single    Spouse name: Not on file  . Number of children: 2  . Years of education: Not on file  . Highest education level: Not on file  Occupational History  . 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 Smoking status: Former Smoker    Packs/day: 0.50    Years: 1.00    Pack years: 0.50    Types: Cigarettes    Quit date: 12/16/1993    Years since quitting: 26.4  . Smokeless tobacco: Never Used  Substance and Sexual Activity  . Alcohol use: Yes    Comment: occasional  . Drug use: No  . Sexual activity: Never  Other Topics Concern  . Not on file  Social History Narrative  . Not on file   Social Determinants of Health   Financial Resource Strain:   . Difficulty of Paying Living Expenses:   Food Insecurity:   . Worried About Charity fundraiser in the Last Year:   . Arboriculturist in the Last Year:   Transportation Needs:   . Film/video editor (Medical):   Marland Kitchen Lack of Transportation (Non-Medical):   Physical Activity:   . Days of Exercise per Week:   . Minutes of Exercise per Session:    Stress:   . Feeling of Stress :   Social Connections:   . Frequency of Communication with Friends and Family:   . Frequency of Social Gatherings with Friends and Family:   . Attends Religious Services:   . Active Member of Clubs or Organizations:   . Attends Archivist Meetings:   Marland Kitchen Marital Status:   Intimate Partner Violence:   . Fear of Current or Ex-Partner:   . Emotionally Abused:   Marland Kitchen Physically Abused:   . Sexually Abused:     FAMILY HISTORY: Family History  Problem Relation Age of Onset  . Allergies Mother   . Asthma Mother   . Lung cancer Mother   . Allergies Sister   . Allergies Brother   . Asthma Brother   . Asthma Sister   . Sarcoidosis Neg Hx   .  Colon cancer Neg Hx   . Liver cancer Neg Hx   . Stomach cancer Neg Hx   . Esophageal cancer Neg Hx   . Rectal cancer Neg Hx     ALLERGIES:  is allergic to shellfish allergy and penicillins.  MEDICATIONS:  Current Outpatient Medications  Medication Sig Dispense Refill  . hydrocortisone 2.5 % cream Apply 1 application topically 2 (two) times daily.    Marland Kitchen albuterol (VENTOLIN HFA) 108 (90 Base) MCG/ACT inhaler Inhale 2 puffs into the lungs every 4 (four) hours as needed for wheezing or shortness of breath. 25.5 g 1  . bisoprolol (ZEBETA) 10 MG tablet TAKE 1 TABLET TWICE DAILY 180 tablet 1  . budesonide-formoterol (SYMBICORT) 80-4.5 MCG/ACT inhaler Inhale 2 puffs into the lungs 2 (two) times daily. 3 Inhaler 3  . desonide (DESOWEN) 0.05 % lotion Apply topically 2 (two) times daily. Continue following with dermatologist 59 mL 0  . dextromethorphan (DELSYM) 30 MG/5ML liquid Take by mouth as needed for cough.     . gabapentin (NEURONTIN) 100 MG capsule Take 1 capsule (100 mg total) by mouth 3 (three) times daily. 90 capsule 0  . hydrochlorothiazide (HYDRODIURIL) 25 MG tablet Take 1 tablet (25 mg total) by mouth daily. 90 tablet 3  . linaclotide (LINZESS) 145 MCG CAPS capsule Take 1 capsule (145 mcg total) by mouth  daily before breakfast. 30 capsule 1  . loratadine (CLARITIN) 10 MG tablet Take 10 mg by mouth daily as needed for allergies.    Marland Kitchen morphine (MSIR) 15 MG tablet Take 1 tablet (15 mg total) by mouth every 4 (four) hours as needed for severe pain. 30 tablet 0  . predniSONE (DELTASONE) 10 MG tablet Take 0.5 tablets (5 mg total) by mouth daily with breakfast. 90 tablet 3  . RABEprazole (ACIPHEX) 20 MG tablet Take 1 tablet (20 mg total) by mouth daily before breakfast. 90 tablet 3  . simvastatin (ZOCOR) 20 MG tablet Take 1 tablet (20 mg total) by mouth at bedtime. 90 tablet 3  . vitamin C (ASCORBIC ACID) 500 MG tablet Take 500 mg by mouth daily.     No current facility-administered medications for this visit.    REVIEW OF SYSTEMS:   Constitutional: Denies fevers, chills or abnormal night sweats Eyes: Denies blurriness of vision, double vision or watery eyes Ears, nose, mouth, throat, and face: Denies mucositis or sore throat Cardiovascular: Denies palpitation, chest discomfort  Gastrointestinal:  Denies nausea, heartburn or change in bowel habits Skin: Denies abnormal skin rashes Lymphatics: Denies new lymphadenopathy or easy bruising Neurological:Denies numbness, tingling or new weaknesses Behavioral/Psych: Mood is stable, no new changes  All other systems were reviewed with the patient and are negative.  PHYSICAL EXAMINATION: ECOG PERFORMANCE STATUS: 1 - Symptomatic but completely ambulatory  Vitals:   05/11/20 1032  BP: (!) 192/74  Pulse: 73  Resp: 17  Temp: 98.3 F (36.8 C)  SpO2: 97%   Filed Weights   05/11/20 1032  Weight: 216 lb 6.4 oz (98.2 kg)    GENERAL:alert, no distress and comfortable SKIN: skin color, texture, turgor are normal, no rashes or significant lesions EYES: normal, conjunctiva are pink and non-injected, sclera clear OROPHARYNX:no exudate, no erythema and lips, buccal mucosa, and tongue normal  NECK: supple, thyroid normal size, non-tender, without  nodularity LYMPH:  no palpable lymphadenopathy in the cervical, axillary or inguinal LUNGS: clear to auscultation and percussion with normal breathing effort HEART: regular rate & rhythm and no murmurs with bilateral lower extremity edema,  left greater than right ABDOMEN:abdomen soft, mild discomfort on deep palpation on the suprapubic region Musculoskeletal:no cyanosis of digits and no clubbing  PSYCH: alert & oriented x 3 with fluent speech NEURO: no focal motor/sensory deficits  LABORATORY DATA:  I have reviewed the data as listed Lab Results  Component Value Date   WBC 7.3 01/14/2020   HGB 11.8 (L) 01/14/2020   HCT 34.6 (L) 01/14/2020   MCV 91.8 01/14/2020   PLT 176.0 01/14/2020   Recent Labs    08/09/19 1558 08/09/19 1558 01/14/20 1204 03/01/20 1053 05/10/20 1535  NA 143  --  138  --  142  K 3.9   < > 3.4* 3.6 3.7  CL 105  --  100  --  103  CO2 29  --  31  --  30  GLUCOSE 109*  --  119*  --  108*  BUN 15  --  13  --  21  CREATININE 0.86  --  0.76  --  0.90  CALCIUM 9.7  --  9.3  --  10.0  GFRNONAA >60  --   --   --  >60  GFRAA >60  --   --   --  >60  PROT 8.1  --  8.1  --   --   ALBUMIN 3.6  --  4.0  --   --   AST 25  --  17  --   --   ALT 28  --  19  --   --   ALKPHOS 116  --  92  --   --   BILITOT 0.5  --  0.6  --   --    < > = values in this interval not displayed.    RADIOGRAPHIC STUDIES: I have personally reviewed the radiological images as listed and agreed with the findings in the report. DG Tibia/Fibula Left  Result Date: 05/10/2020 CLINICAL DATA:  Left lower leg pain for 3 months. EXAM: LEFT TIBIA AND FIBULA - 2 VIEW COMPARISON:  Left foot radiographs 12/25/2012 FINDINGS: No fracture or destructive osseous lesion is identified. The knee and ankle are located. Small posterior and large plantar calcaneal enthesophytes are again seen with the latter mildly enlarging since 2014. There is mild nonspecific subcutaneous fat reticulation in the lower leg.  IMPRESSION: No acute osseous abnormality identified. Electronically Signed   By: Logan Bores M.D.   On: 05/10/2020 15:03   VAS [place] LOWER EXTREMITY VENOUS REFLUX  Result Date: 04/18/2020  Lower Venous Reflux Study Indications: Pain, and Swelling.  Limitations: Body habitus. Comparison Study: None Performing Technologist: Kathrine Comfort RVT, RDCS  Examination Guidelines: A complete evaluation includes B-mode imaging, spectral Doppler, color Doppler, and power Doppler as needed of all accessible portions of each vessel. Bilateral testing is considered an integral part of a complete examination. Limited examinations for reoccurring indications may be performed as noted. The reflux portion of the exam is performed with the patient in reverse Trendelenburg. Significant venous reflux is defined as >500 ms in the superficial venous system, and >1 second in the deep venous system.  +--------------+---------+------+-----------+------------+--------+ LEFT          Reflux NoRefluxReflux TimeDiameter cmsComments                         Yes                                  +--------------+---------+------+-----------+------------+--------+  CFV           no                                             +--------------+---------+------+-----------+------------+--------+ FV prox       no                                             +--------------+---------+------+-----------+------------+--------+ FV mid        no                                             +--------------+---------+------+-----------+------------+--------+ FV dist       no                                             +--------------+---------+------+-----------+------------+--------+ Popliteal     no                                             +--------------+---------+------+-----------+------------+--------+ GSV at [facility]    no                            0.43              +--------------+---------+------+-----------+------------+--------+ GSV prox thighno                            0.42             +--------------+---------+------+-----------+------------+--------+ GSV mid thigh no                            0.35             +--------------+---------+------+-----------+------------+--------+ GSV dist thighno                            0.38             +--------------+---------+------+-----------+------------+--------+ GSV at knee   no                            0.32             +--------------+---------+------+-----------+------------+--------+ GSV prox calf no                            0.32             +--------------+---------+------+-----------+------------+--------+ GSV mid calf                                0.17             +--------------+---------+------+-----------+------------+--------+ SSV Pop Fossa no  0.7              +--------------+---------+------+-----------+------------+--------+ SSV prox calf no                            0.3              +--------------+---------+------+-----------+------------+--------+ SSV mid calf  no                            0.20             +--------------+---------+------+-----------+------------+--------+   Summary: Left: - No evidence of deep vein thrombosis seen in the left lower extremity, from the common femoral through the popliteal veins. - No evidence of superficial venous thrombosis in the left lower extremity. - There is no evidence of venous reflux seen in the left lower extremity. - Rouleax flow noted in the superficial system.  *See table(s) above for measurements and observations. Electronically signed by Curt Jews MD on 04/18/2020 at 4:11:23 PM.    Final

## 2020-05-11 NOTE — Assessment & Plan Note (Signed)
She has no recurrence in this regard

## 2020-05-12 ENCOUNTER — Telehealth: Payer: Self-pay

## 2020-05-12 ENCOUNTER — Telehealth: Payer: Self-pay | Admitting: *Deleted

## 2020-05-12 LAB — SURGICAL PATHOLOGY

## 2020-05-12 NOTE — Telephone Encounter (Signed)
Per family request called and moved scans (PET/CT) to [facility] on June 3rd. Called and gave the patient's sister the appts

## 2020-05-12 NOTE — Telephone Encounter (Signed)
Spoke with sister [name] and told her that the biopsy showed the tissue with acute inflammatory exudate and blood consistent with Ulcer. No dysplasia or malignancy seen. Told sister that this was one piece taken from the mass Dr. Denman George felt on examination.  The Pet Scan will give more information as to weather there is a cancerous mass nor not per Joylene John, NP.

## 2020-05-16 ENCOUNTER — Telehealth: Payer: Self-pay | Admitting: Hematology and Oncology

## 2020-05-16 NOTE — Telephone Encounter (Signed)
Scheduled appts per 6/1 los. Pt confirmed appt date and time

## 2020-05-18 ENCOUNTER — Telehealth: Payer: Self-pay | Admitting: Gynecologic Oncology

## 2020-05-18 ENCOUNTER — Ambulatory Visit
Admission: RE | Admit: 2020-05-18 | Discharge: 2020-05-18 | Disposition: A | Discharge status: Home / Self Care | Payer: Medicare HMO | Source: Ambulatory Visit | Attending: Gynecologic Oncology | Admitting: Gynecologic Oncology

## 2020-05-18 ENCOUNTER — Other Ambulatory Visit: Payer: Self-pay

## 2020-05-18 DIAGNOSIS — Z85048 Personal history of other malignant neoplasm of rectum, rectosigmoid junction, and anus: Secondary | ICD-10-CM | POA: Insufficient documentation

## 2020-05-18 DIAGNOSIS — C7802 Secondary malignant neoplasm of left lung: Secondary | ICD-10-CM

## 2020-05-18 DIAGNOSIS — N898 Other specified noninflammatory disorders of vagina: Secondary | ICD-10-CM

## 2020-05-18 DIAGNOSIS — C775 Secondary and unspecified malignant neoplasm of intrapelvic lymph nodes: Secondary | ICD-10-CM

## 2020-05-18 DIAGNOSIS — C541 Malignant neoplasm of endometrium: Secondary | ICD-10-CM | POA: Diagnosis not present

## 2020-05-18 DIAGNOSIS — M25552 Pain in left hip: Secondary | ICD-10-CM

## 2020-05-18 DIAGNOSIS — I700 Atherosclerosis of aorta: Secondary | ICD-10-CM | POA: Insufficient documentation

## 2020-05-18 DIAGNOSIS — C786 Secondary malignant neoplasm of retroperitoneum and peritoneum: Secondary | ICD-10-CM | POA: Diagnosis not present

## 2020-05-18 DIAGNOSIS — R918 Other nonspecific abnormal finding of lung field: Secondary | ICD-10-CM | POA: Diagnosis not present

## 2020-05-18 DIAGNOSIS — R600 Localized edema: Secondary | ICD-10-CM | POA: Diagnosis not present

## 2020-05-18 DIAGNOSIS — N134 Hydroureter: Secondary | ICD-10-CM

## 2020-05-18 DIAGNOSIS — N132 Hydronephrosis with renal and ureteral calculous obstruction: Secondary | ICD-10-CM | POA: Diagnosis not present

## 2020-05-18 DIAGNOSIS — I2584 Coronary atherosclerosis due to calcified coronary lesion: Secondary | ICD-10-CM | POA: Diagnosis not present

## 2020-05-18 DIAGNOSIS — C55 Malignant neoplasm of uterus, part unspecified: Secondary | ICD-10-CM | POA: Diagnosis not present

## 2020-05-18 DIAGNOSIS — C7989 Secondary malignant neoplasm of other specified sites: Secondary | ICD-10-CM

## 2020-05-18 DIAGNOSIS — R911 Solitary pulmonary nodule: Secondary | ICD-10-CM | POA: Diagnosis not present

## 2020-05-18 LAB — GLUCOSE, CAPILLARY: Glucose-Capillary: 128 mg/dL — ABNORMAL HIGH (ref 70–99)

## 2020-05-18 MED ORDER — IOHEXOL 300 MG/ML  SOLN
100.0000 mL | Freq: Once | INTRAMUSCULAR | Status: AC | PRN
  Administered 2020-05-18: 100 mL via INTRAVENOUS

## 2020-05-18 MED ORDER — FLUDEOXYGLUCOSE F - 18 (FDG) INJECTION
11.6000 | Freq: Once | INTRAVENOUS | Status: AC | PRN
  Administered 2020-05-18: 11.6 via INTRAVENOUS

## 2020-05-18 NOTE — Telephone Encounter (Signed)
Informed patient regarding results of PET scan. Explained the findings and documented sites of disease. I explained that she has recurrent uterine cancer with disease in the left lung, left pelvic sidewall and right anterior peritoneum.  Recommend chemo with carb/taxol and reassessment after 3 cycles for response.  Recommend consultation with urology for consideration of placement of left ureteral stent.   Thereasa Solo, MD

## 2020-05-19 ENCOUNTER — Other Ambulatory Visit: Payer: Self-pay

## 2020-05-19 ENCOUNTER — Encounter: Payer: Self-pay | Admitting: Oncology

## 2020-05-19 ENCOUNTER — Inpatient Hospital Stay: Payer: Medicare HMO | Attending: Gynecologic Oncology | Admitting: Hematology and Oncology

## 2020-05-19 ENCOUNTER — Encounter: Payer: Self-pay | Admitting: Hematology and Oncology

## 2020-05-19 VITALS — BP 155/78 | HR 87 | Temp 98.4°F | Resp 18 | Ht 65.5 in | Wt 215.0 lb

## 2020-05-19 DIAGNOSIS — N1330 Unspecified hydronephrosis: Secondary | ICD-10-CM | POA: Diagnosis not present

## 2020-05-19 DIAGNOSIS — Z90722 Acquired absence of ovaries, bilateral: Secondary | ICD-10-CM | POA: Insufficient documentation

## 2020-05-19 DIAGNOSIS — C541 Malignant neoplasm of endometrium: Secondary | ICD-10-CM

## 2020-05-19 DIAGNOSIS — D869 Sarcoidosis, unspecified: Secondary | ICD-10-CM | POA: Insufficient documentation

## 2020-05-19 DIAGNOSIS — K5909 Other constipation: Secondary | ICD-10-CM

## 2020-05-19 DIAGNOSIS — N130 Hydronephrosis with ureteropelvic junction obstruction: Secondary | ICD-10-CM | POA: Diagnosis not present

## 2020-05-19 DIAGNOSIS — C775 Secondary and unspecified malignant neoplasm of intrapelvic lymph nodes: Secondary | ICD-10-CM | POA: Diagnosis not present

## 2020-05-19 DIAGNOSIS — Z90710 Acquired absence of both cervix and uterus: Secondary | ICD-10-CM | POA: Insufficient documentation

## 2020-05-19 DIAGNOSIS — R110 Nausea: Secondary | ICD-10-CM

## 2020-05-19 DIAGNOSIS — G893 Neoplasm related pain (acute) (chronic): Secondary | ICD-10-CM

## 2020-05-19 DIAGNOSIS — Z5111 Encounter for antineoplastic chemotherapy: Secondary | ICD-10-CM | POA: Insufficient documentation

## 2020-05-19 DIAGNOSIS — R600 Localized edema: Secondary | ICD-10-CM | POA: Insufficient documentation

## 2020-05-19 DIAGNOSIS — N131 Hydronephrosis with ureteral stricture, not elsewhere classified: Secondary | ICD-10-CM | POA: Diagnosis not present

## 2020-05-19 DIAGNOSIS — R911 Solitary pulmonary nodule: Secondary | ICD-10-CM | POA: Diagnosis not present

## 2020-05-19 DIAGNOSIS — R112 Nausea with vomiting, unspecified: Secondary | ICD-10-CM | POA: Insufficient documentation

## 2020-05-19 MED ORDER — ONDANSETRON HCL 8 MG PO TABS: 8.0000 mg | ORAL_TABLET | Freq: Three times a day (TID) | ORAL | 3 refills | Status: DC | PRN

## 2020-05-19 MED ORDER — PROCHLORPERAZINE MALEATE 10 MG PO TABS
10.0000 mg | ORAL_TABLET | Freq: Four times a day (QID) | ORAL | 3 refills | Status: DC | PRN
Start: 2020-05-19 — End: 2021-02-02

## 2020-05-19 MED FILL — PROCHLORPERAZINE 10 MG TAB: 10 | 8 days supply | Qty: 30 | Fill #0

## 2020-05-19 MED FILL — ONDANSETRON HCL 8 MG TABLET: 8 | 10 days supply | Qty: 30 | Fill #0

## 2020-05-19 NOTE — Assessment & Plan Note (Signed)
Several lung nodules are new and highly suspicious for metastatic disease I recommend we proceed with systemic chemotherapy as soon as possible

## 2020-05-19 NOTE — Assessment & Plan Note (Signed)
Her recent renal function is preserved She has appointment to see urologist My preference would be not to proceed with stent placement if possible to avoid complications before starting treatment but I would defer to urologist for further recommendation

## 2020-05-19 NOTE — Progress Notes (Signed)
[facility] OFFICE PROGRESS NOTE  Patient Care Team: [name], [name], MD as PCP - General (Family Medicine)  ASSESSMENT & PLAN:  Endometrial cancer [facility]) Have reviewed multiple imaging studies with the patient and her sister Unfortunately, PET CT scan is highly suspicious for metastatic cancer to the lung At this point in time, we discussed the role of palliative systemic chemotherapy We discussed various different options Ultimately, she is in agreement to proceed with combination chemotherapy with carboplatin and paclitaxel  We reviewed the NCCN guidelines We discussed the role of chemotherapy. The intent is of palliative intent.  We discussed some of the risks, benefits, side-effects of carboplatin & Taxol. Treatment is intravenous, every 3 weeks x 6 cycles  Some of the short term side-effects included, though not limited to, including weight loss, life threatening infections, risk of allergic reactions, need for transfusions of blood products, nausea, vomiting, change in bowel habits, loss of hair, admission to hospital for various reasons, and risks of death.   Long term side-effects are also discussed including risks of infertility, permanent damage to nerve function, hearing loss, chronic fatigue, kidney damage with possibility needing hemodialysis, and rare secondary malignancy including bone marrow disorders.  The patient is aware that the response rates discussed earlier is not guaranteed.  After a long discussion, patient made an informed decision to proceed with the prescribed plan of care.   Patient education material was dispensed. We discussed premedication with dexamethasone before chemotherapy. I plan to order port placement and chemo education class before we proceed with treatment I plan to see her next week to assess symptom management I will tentatively schedule her to start chemotherapy on June 14   Lung nodule seen on imaging study Several lung  nodules are new and highly suspicious for metastatic disease I recommend we proceed with systemic chemotherapy as soon as possible  Cancer associated pain Her pain management has improved with addition of immediate release morphine I recommend discontinuation of gabapentin I will see her again next week to assess pain management  Nausea without vomiting We discussed antiemetics  Other constipation We discussed the importance of aggressive laxative therapy  Hydronephrosis of left kidney Her recent renal function is preserved She has appointment to see urologist My preference would be not to proceed with stent placement if possible to avoid complications before starting treatment but I would defer to urologist for further recommendation   No orders of the defined types were placed in this encounter.   All questions were answered. The patient knows to call the clinic with any problems, questions or concerns. The total time spent in the appointment was 40 minutes encounter with patients including review of chart and various tests results, discussions about plan of care and coordination of care plan   [name], MD 05/19/2020 1:47 PM  INTERVAL HISTORY: Please see below for problem oriented charting. She returns with her sister for further follow-up Since last time I saw her, her pain management is likely improved She has some nausea She still have some mild constipation Leg swelling is stable She is urinating well  SUMMARY OF ONCOLOGIC HISTORY: Oncology History Overview Note  ER/PR neg, MSI stable   Endometrial cancer ([place])  06/15/2018 Surgery   Surgeon: Donaciano Eva     Operation: Robotic-assisted laparoscopic total hysterectomy with bilateral salpingoophorectomy, SLN biopsy     Operative Findings:  : 6cm uterus, normal tubes and ovaries, omentum adherent to the anterior abdominal wall and uterine fundus. No  suspicious nodes.   06/15/2018 Pathology Results    Procedure: Total hysterectomy with bilateral salpingo-oophorectomy. Bilateral obturator sentinel lymph node biopsies. Histologic type: Endometrioid adenocarcinoma. Histologic Grade: FIGO grade 1. Myometrial invasion: Depth of invasion: 15 mm Myometrial thickness: 20 mm Uterine Serosa Involvement: Not identified. Cervical stromal involvement: Not identified. Extent of involvement of other organs: Uninvolved. Lymphovascular invasion: Present. Regional Lymph Nodes: Examined: 2 Sentinel 0 Non-sentinel 2 Total Lymph nodes with metastasis: 0 Isolated tumor cells (< 0.2 mm): 2 Micrometastasis: (> 0.2 mm and < 2.0 mm): 0 Macrometastasis: (> 2.0 mm): 0 Extracapsular extension: N/A. Tumor block for ancillary studies: 4Q-S. MMR / MSI testing: Will be ordered. Pathologic Stage Classification (pTNM, AJCC 8th edition): pT1b, pN0(i+)   07/20/2018 Imaging   1. Surgical changes from recent hysterectomy. No findings suspicious for residual tumor or adenopathy in the pelvis. 2. No CT findings to suggest omental, peritoneal surface or solid organ metastatic disease in the abdomen. 3. Changes consistent with known sarcoidosis in the chest with bulky calcified mediastinal and hilar lymph nodes and interstitial lung disease. I do not see any definite CT findings to suggest metastatic disease involving the chest.   08/19/2018 - 09/17/2018 Radiation Therapy   08/19/18 - 09/17/18: Vaginal Cuff, 3 cm cylinder with treatment length of 3 cm / 30 Gy delivered in 5 fractions of 6 Gy, Brachytherapy / HDR, Iridium-192   05/2005: Left Chest Wall keloid   04/2004 - 05/2004: Anus, pelvis /inguinal ( Dr. Tammi Klippel)   01/19/2020 Imaging   1. No definite CT findings of the abdomen or pelvis to explain pain. 2. The sigmoid colon and rectum are decompressed although slightly thickened appearing, suggestive of nonspecific infectious, inflammatory, or ischemic colitis. Correlate for referable clinical symptoms. 3. There is trace,  nonspecific free fluid in the low pelvis, which may be reactive, although would be difficult to distinguish from postoperative and post treatment soft tissue change in the pelvis given history of hysterectomy and rectal cancer. 4. Unchanged prominent retroperitoneal and iliac lymph nodes. No new findings suspicious for metastatic disease in the abdomen or pelvis. 5. Bibasilar pulmonary fibrosis and mediastinal and hilar lymphadenopathy, better evaluated by prior CT chest and consistent with patient diagnosis of sarcoidosis.       04/18/2020 Imaging   Venous Doppler Left:  - No evidence of deep vein thrombosis seen in the left lower extremity, from the common femoral through the popliteal veins.  - No evidence of superficial venous thrombosis in the left lower extremity.  - There is no evidence of venous reflux seen in the left lower extremity.  - Rouleax flow noted in the superficial system.    05/18/2020 PET scan   1. Complex scan with a combination of hypermetabolic granulomatous disease (sarcoidosis) as well as hypermetabolic metastatic endometrial carcinoma. 2. Hypermetabolic mediastinal hilar lymph nodes and pulmonary nodularity with bronchovascular thickening is consistent with sarcoidosis. 3. Solitary hypermetabolic nodule in the medial LEFT lower lobe, new from prior with intense metabolic activity, is concerning for a metastatic deposit to the LEFT lower lobe. 4. Ill-defined hypermetabolic soft tissue along the LEFT operator space is most consistent with local recurrence of endometrial carcinoma. There is entrapment of the LEFT ureter by this hypermetabolic soft tissue with mild hydroureter and hydronephrosis on the LEFT. 5. Additionally there appears to be direct extension from the hypermetabolic left operator space soft tissue to the sigmoid colon with there is a well-defined hypermetabolic lesion in the colon. 6. More distant hypermetabolic peritoneal metastasis along the ventral  peritoneal surface of the RIGHT abdomen beneath RIGHT rectus abdominus muscle. 7. Hypermetabolic lymph nodes in the porta hepatis are favored related to sarcoidosis.   05/19/2020 Cancer Staging   Staging form: Corpus Uteri - Carcinoma and Carcinosarcoma, AJCC 8th Edition - Clinical stage from 05/19/2020: Stage IVB (rcT1, cN2a, cM1) - Signed by [name], MD on 05/19/2020     REVIEW OF SYSTEMS:   Constitutional: Denies fevers, chills or abnormal weight loss Eyes: Denies blurriness of vision Ears, nose, mouth, throat, and face: Denies mucositis or sore throat Respiratory: Denies cough, dyspnea or wheezes Cardiovascular: Denies palpitation, chest discomfort Skin: Denies abnormal skin rashes Lymphatics: Denies new lymphadenopathy or easy bruising Neurological:Denies numbness, tingling or new weaknesses Behavioral/Psych: Mood is stable, no new changes  All other systems were reviewed with the patient and are negative.  I have reviewed the past medical history, past surgical history, social history and family history with the patient and they are unchanged from previous note.  ALLERGIES:  is allergic to shellfish allergy and penicillins.  MEDICATIONS:  Current Outpatient Medications  Medication Sig Dispense Refill  . albuterol (VENTOLIN HFA) 108 (90 Base) MCG/ACT inhaler Inhale 2 puffs into the lungs every 4 (four) hours as needed for wheezing or shortness of breath. 25.5 g 1  . bisoprolol (ZEBETA) 10 MG tablet TAKE 1 TABLET TWICE DAILY 180 tablet 1  . budesonide-formoterol (SYMBICORT) 80-4.5 MCG/ACT inhaler Inhale 2 puffs into the lungs 2 (two) times daily. 3 Inhaler 3  . desonide (DESOWEN) 0.05 % lotion Apply topically 2 (two) times daily. Continue following with dermatologist 59 mL 0  . dextromethorphan (DELSYM) 30 MG/5ML liquid Take by mouth as needed for cough.     . hydrochlorothiazide (HYDRODIURIL) 25 MG tablet TAKE 1 TABLET EVERY DAY 90 tablet 3  . hydrocortisone 2.5 % cream Apply 1  application topically 2 (two) times daily.    Marland Kitchen linaclotide (LINZESS) 145 MCG CAPS capsule Take 1 capsule (145 mcg total) by mouth daily before breakfast. 30 capsule 1  . loratadine (CLARITIN) 10 MG tablet Take 10 mg by mouth daily as needed for allergies.    Marland Kitchen morphine (MSIR) 15 MG tablet Take 1 tablet (15 mg total) by mouth every 4 (four) hours as needed for severe pain. 30 tablet 0  . ondansetron (ZOFRAN) 8 MG tablet Take 1 tablet (8 mg total) by mouth every 8 (eight) hours as needed for nausea. 30 tablet 3  . predniSONE (DELTASONE) 10 MG tablet Take 0.5 tablets (5 mg total) by mouth daily with breakfast. 90 tablet 3  . prochlorperazine (COMPAZINE) 10 MG tablet Take 1 tablet (10 mg total) by mouth every 6 (six) hours as needed for nausea or vomiting. 30 tablet 3  . RABEprazole (ACIPHEX) 20 MG tablet Take 1 tablet (20 mg total) by mouth daily before breakfast. 90 tablet 3  . simvastatin (ZOCOR) 20 MG tablet Take 1 tablet (20 mg total) by mouth at bedtime. 90 tablet 3  . vitamin C (ASCORBIC ACID) 500 MG tablet Take 500 mg by mouth daily.     No current facility-administered medications for this visit.    PHYSICAL EXAMINATION: ECOG PERFORMANCE STATUS: 1 - Symptomatic but completely ambulatory  Vitals:   05/19/20 1213  BP: (!) 155/78  Pulse: 87  Resp: 18  Temp: 98.4 F (36.9 C)  SpO2: 100%   Filed Weights   05/19/20 1213  Weight: 215 lb (97.5 kg)    GENERAL:alert, no distress and comfortable HEART: She has persistent left lower  extremity edema NEURO: alert & oriented x 3 with fluent speech, no focal motor/sensory deficits  LABORATORY DATA:  I have reviewed the data as listed    Component Value Date/Time   NA 142 05/10/2020 1535   K 3.7 05/10/2020 1535   CL 103 05/10/2020 1535   CO2 30 05/10/2020 1535   GLUCOSE 108 (H) 05/10/2020 1535   BUN 21 05/10/2020 1535   CREATININE 0.90 05/10/2020 1535   CREATININE 0.86 08/09/2019 1558   CALCIUM 10.0 05/10/2020 1535   PROT 8.1  01/14/2020 1204   ALBUMIN 4.0 01/14/2020 1204   AST 17 01/14/2020 1204   AST 25 08/09/2019 1558   ALT 19 01/14/2020 1204   ALT 28 08/09/2019 1558   ALKPHOS 92 01/14/2020 1204   BILITOT 0.6 01/14/2020 1204   BILITOT 0.5 08/09/2019 1558   GFRNONAA >60 05/10/2020 1535   GFRNONAA >60 08/09/2019 1558   GFRAA >60 05/10/2020 1535   GFRAA >60 08/09/2019 1558    No results found for: SPEP, UPEP  Lab Results  Component Value Date   WBC 7.3 01/14/2020   NEUTROABS 4.8 01/14/2020   HGB 11.8 (L) 01/14/2020   HCT 34.6 (L) 01/14/2020   MCV 91.8 01/14/2020   PLT 176.0 01/14/2020      Chemistry      Component Value Date/Time   NA 142 05/10/2020 1535   K 3.7 05/10/2020 1535   CL 103 05/10/2020 1535   CO2 30 05/10/2020 1535   BUN 21 05/10/2020 1535   CREATININE 0.90 05/10/2020 1535   CREATININE 0.86 08/09/2019 1558      Component Value Date/Time   CALCIUM 10.0 05/10/2020 1535   ALKPHOS 92 01/14/2020 1204   AST 17 01/14/2020 1204   AST 25 08/09/2019 1558   ALT 19 01/14/2020 1204   ALT 28 08/09/2019 1558   BILITOT 0.6 01/14/2020 1204   BILITOT 0.5 08/09/2019 1558       RADIOGRAPHIC STUDIES: I have reviewed multiple imaging studies with the patient and her sister I have personally reviewed the radiological images as listed and agreed with the findings in the report. DG Tibia/Fibula Left  Result Date: 05/10/2020 CLINICAL DATA:  Left lower leg pain for 3 months. EXAM: LEFT TIBIA AND FIBULA - 2 VIEW COMPARISON:  Left foot radiographs 12/25/2012 FINDINGS: No fracture or destructive osseous lesion is identified. The knee and ankle are located. Small posterior and large plantar calcaneal enthesophytes are again seen with the latter mildly enlarging since 2014. There is mild nonspecific subcutaneous fat reticulation in the lower leg. IMPRESSION: No acute osseous abnormality identified. Electronically Signed   By: Logan Bores M.D.   On: 05/10/2020 15:03   CT Chest W Contrast  Result  Date: 05/18/2020 CLINICAL DATA:  Vaginal mass in lower extremity edema. History of endometrial cancer, suspected recurrence. Also with history of sarcoidosis. EXAM: CT CHEST, ABDOMEN, AND PELVIS WITH CONTRAST TECHNIQUE: Multidetector CT imaging of the chest, abdomen and pelvis was performed following the standard protocol during bolus administration of intravenous contrast. CONTRAST:  132m OMNIPAQUE IOHEXOL 300 MG/ML  SOLN COMPARISON:  07/20/2018 and PET-CT 05/18/2020 FINDINGS: CT CHEST FINDINGS Cardiovascular: Calcified and noncalcified plaque throughout the thoracic aorta. Heart size enlarged but stable. Particularly with LEFT atrial enlargement. Calcified coronary artery disease. Main pulmonary artery 3.2 cm. Pulmonary arterial evaluation otherwise unremarkable on venous phase imaging. Mediastinum/Nodes: No axillary lymphadenopathy. Bulky mediastinal lymph nodes displaying calcification in the setting of known sarcoidosis. Also with hilar nodal enlargement displaying calcification. Lungs/Pleura: New pulmonary  nodule in the RIGHT mid chest in the RIGHT upper lobe (image 59, series 4) 4 mm. Signs of fibrotic lung disease perhaps with some worsening since the previous study which was limited by respiratory motion. There were basilar nodules noted previously though there is a nodule at the medial LEFT lung base that measures approximately 1.4 x 1.4 cm that displays increased FDG uptake and there are other small nodules at the RIGHT lung base that were not definitively seen on the prior study, for instance a small nodule on image 92 of series 4 measures 6 mm and a similar size nodule on image 86 of series 4 also in the RIGHT lower lobe. At least 3 new nodules in the RIGHT lower lobe since previous imaging. New nodule in the LEFT chest on image 80 of series 4 in the LEFT lower lobe at 8 mm. Airways are patent. Musculoskeletal: No chest wall mass. See below for full musculoskeletal details. CT ABDOMEN PELVIS FINDINGS  Hepatobiliary: Lobular hepatic contours. No focal suspicious hepatic lesion. Portal vein is patent. Post cholecystectomy without biliary ductal dilation. Pancreas: Pancreas is normal without focal lesion or ductal dilation. No pancreatic inflammation. Spleen: Spleen normal size without focal lesion. Adrenals/Urinary Tract: Adrenal glands are normal. Smooth renal contours but with delayed enhancement of the LEFT kidney as compared to the RIGHT displaying mild to moderate LEFT hydroureteronephrosis. Obstruction of the LEFT ureter secondary to LEFT pelvic sidewall and vaginal apex mass measuring 3.6 by 4.2 cm (image 98, series 2) Adjacent colonic thickening of the sigmoid colon likely reflects involvement of the colon as well. Stomach/Bowel: Direct colonic involvement of the sigmoid colon as described. The appendix is normal. No sign of small bowel obstruction. Vascular/Lymphatic: Calcified and noncalcified atheromatous plaque of the abdominal aorta without sign of aneurysm. No adenopathy by size criteria in the retroperitoneum. Peri hepatic lymph node with mild enlargement may show some subtle calcification, potentially related to the patient's sarcoidosis. LEFT pelvic sidewall mass invades the obturator muscle and encases vascular structures and LEFT ureter. Mildly enlarged LEFT inguinal lymph nodes. No gross FDG uptake on previous PET scan. Reproductive: Post hysterectomy with pelvic mass as described. The nodularity of the pericolonic and perirectal fat. Fascial thickening in the pelvis and thickening of mesenteric reflections. Ill-defined nodule in the anterior peritoneum of the lower abdomen/pelvis (image 89, series 2) indicative of peritoneal metastasis. Other: As above with peritoneal disease. Also with body wall involvement of the RIGHT rectus muscle as seen on the PET scan (image 78, series 2) Musculoskeletal: No destructive bone process. No acute bone finding. Spinal degenerative changes. IMPRESSION: 1. Mass  of the LEFT vaginal apex extending to the LEFT pelvic sidewall, invading the LEFT obturator muscle, extending and involving the colon as well as obstructing the distal LEFT ureter. 2. LEFT ureteral obstruction with mild to moderate hydroureteronephrosis and delayed enhancement. 3. Signs of peritoneal and body wall involvement. 4. Signs of pulmonary metastatic disease particularly the dominant LEFT lower lobe nodule. There are other nodules that are new, this is the most suspicious of pulmonary nodules in the chest. 5. Signs of fibrotic lung disease perhaps with some worsening since the previous study which was limited by respiratory motion. 6. Signs of sarcoidosis in addition to fibrotic lung disease with enlarged and calcified lymph nodes in the chest with similar appearance. 7. Aortic atherosclerosis. These results will be called to the ordering clinician or representative by the Radiologist Assistant, and communication documented in the PACS or [employer]. Aortic Atherosclerosis (ICD10-I70.0).  Electronically Signed   By: Zetta Bills M.D.   On: 05/18/2020 17:52   CT Abdomen Pelvis W Contrast  Result Date: 05/18/2020 CLINICAL DATA:  Vaginal mass in lower extremity edema. History of endometrial cancer, suspected recurrence. Also with history of sarcoidosis. EXAM: CT CHEST, ABDOMEN, AND PELVIS WITH CONTRAST TECHNIQUE: Multidetector CT imaging of the chest, abdomen and pelvis was performed following the standard protocol during bolus administration of intravenous contrast. CONTRAST:  131m OMNIPAQUE IOHEXOL 300 MG/ML  SOLN COMPARISON:  07/20/2018 and PET-CT 05/18/2020 FINDINGS: CT CHEST FINDINGS Cardiovascular: Calcified and noncalcified plaque throughout the thoracic aorta. Heart size enlarged but stable. Particularly with LEFT atrial enlargement. Calcified coronary artery disease. Main pulmonary artery 3.2 cm. Pulmonary arterial evaluation otherwise unremarkable on venous phase imaging.  Mediastinum/Nodes: No axillary lymphadenopathy. Bulky mediastinal lymph nodes displaying calcification in the setting of known sarcoidosis. Also with hilar nodal enlargement displaying calcification. Lungs/Pleura: New pulmonary nodule in the RIGHT mid chest in the RIGHT upper lobe (image 59, series 4) 4 mm. Signs of fibrotic lung disease perhaps with some worsening since the previous study which was limited by respiratory motion. There were basilar nodules noted previously though there is a nodule at the medial LEFT lung base that measures approximately 1.4 x 1.4 cm that displays increased FDG uptake and there are other small nodules at the RIGHT lung base that were not definitively seen on the prior study, for instance a small nodule on image 92 of series 4 measures 6 mm and a similar size nodule on image 86 of series 4 also in the RIGHT lower lobe. At least 3 new nodules in the RIGHT lower lobe since previous imaging. New nodule in the LEFT chest on image 80 of series 4 in the LEFT lower lobe at 8 mm. Airways are patent. Musculoskeletal: No chest wall mass. See below for full musculoskeletal details. CT ABDOMEN PELVIS FINDINGS Hepatobiliary: Lobular hepatic contours. No focal suspicious hepatic lesion. Portal vein is patent. Post cholecystectomy without biliary ductal dilation. Pancreas: Pancreas is normal without focal lesion or ductal dilation. No pancreatic inflammation. Spleen: Spleen normal size without focal lesion. Adrenals/Urinary Tract: Adrenal glands are normal. Smooth renal contours but with delayed enhancement of the LEFT kidney as compared to the RIGHT displaying mild to moderate LEFT hydroureteronephrosis. Obstruction of the LEFT ureter secondary to LEFT pelvic sidewall and vaginal apex mass measuring 3.6 by 4.2 cm (image 98, series 2) Adjacent colonic thickening of the sigmoid colon likely reflects involvement of the colon as well. Stomach/Bowel: Direct colonic involvement of the sigmoid colon as  described. The appendix is normal. No sign of small bowel obstruction. Vascular/Lymphatic: Calcified and noncalcified atheromatous plaque of the abdominal aorta without sign of aneurysm. No adenopathy by size criteria in the retroperitoneum. Peri hepatic lymph node with mild enlargement may show some subtle calcification, potentially related to the patient's sarcoidosis. LEFT pelvic sidewall mass invades the obturator muscle and encases vascular structures and LEFT ureter. Mildly enlarged LEFT inguinal lymph nodes. No gross FDG uptake on previous PET scan. Reproductive: Post hysterectomy with pelvic mass as described. The nodularity of the pericolonic and perirectal fat. Fascial thickening in the pelvis and thickening of mesenteric reflections. Ill-defined nodule in the anterior peritoneum of the lower abdomen/pelvis (image 89, series 2) indicative of peritoneal metastasis. Other: As above with peritoneal disease. Also with body wall involvement of the RIGHT rectus muscle as seen on the PET scan (image 78, series 2) Musculoskeletal: No destructive bone process. No acute bone finding. Spinal  degenerative changes. IMPRESSION: 1. Mass of the LEFT vaginal apex extending to the LEFT pelvic sidewall, invading the LEFT obturator muscle, extending and involving the colon as well as obstructing the distal LEFT ureter. 2. LEFT ureteral obstruction with mild to moderate hydroureteronephrosis and delayed enhancement. 3. Signs of peritoneal and body wall involvement. 4. Signs of pulmonary metastatic disease particularly the dominant LEFT lower lobe nodule. There are other nodules that are new, this is the most suspicious of pulmonary nodules in the chest. 5. Signs of fibrotic lung disease perhaps with some worsening since the previous study which was limited by respiratory motion. 6. Signs of sarcoidosis in addition to fibrotic lung disease with enlarged and calcified lymph nodes in the chest with similar appearance. 7. Aortic  atherosclerosis. These results will be called to the ordering clinician or representative by the Radiologist Assistant, and communication documented in the PACS or [employer]. Aortic Atherosclerosis (ICD10-I70.0). Electronically Signed   By: Zetta Bills M.D.   On: 05/18/2020 17:52   NM PET Image Initial (PI) Skull Base To Thigh  Result Date: 05/18/2020 CLINICAL DATA:  Subsequent treatment strategy for uterine carcinoma. Additional history of anal carcinoma EXAM: NUCLEAR MEDICINE PET SKULL BASE TO THIGH TECHNIQUE: 11.6 mCi F-18 FDG was injected intravenously. Full-ring PET imaging was performed from the skull base to thigh after the radiotracer. CT data was obtained and used for attenuation correction and anatomic localization. Fasting blood glucose: 128 mg/dl COMPARISON:  CT same day 05/18/2020, CT 01/19/2020 FINDINGS: Mediastinal blood pool activity: SUV max 3.1 Liver activity: SUV max NA NECK: No hypermetabolic lymph nodes in the neck. Incidental CT findings: none CHEST: There multiple bilateral symmetric hypermetabolic lymph nodes Example RIGHT lower paratracheal lymph node SUV max equal 8.1. These lymph nodes have a central calcifications. Lymph nodes are rather bulky. For example RIGHT lower paratracheal node measures 2 cm in diameter. Subcarinal node measuring 1.6 cm. Adenopathy extends into the hila. Review of the lung parenchyma demonstrates peribronchial thickening and multiple discrete pulmonary nodules at the lung bases. For the most part these lung nodules do not have associated metabolic activity. There is 1 nodule in the posteromedial LEFT lower lobe which is larger measuring 18 mm (image 73/4) and with intense metabolic activity SUV max equal 14.3. This is greater metabolic activity than the mediastinal lymph nodes. In comparison to CT 07/20/2018, there is similar calcified mediastinal adenopathy and peripheral nodularity with peribronchovascular thickening all consistent with pulmonary  sarcoidosis. However, the hypermetabolic medial LEFT lobe nodule is not present in 2019. Incidental CT findings: none ABDOMEN/PELVIS: Hypermetabolic a periportal lymph nodes with subtle calcifications are favored relate to presumed sarcoidosis in the chest. Along the LEFT operator space and along the path of the LEFT ureter there is hypermetabolic soft tissue thickening (4.0 x 2.0 cm, image 208) with intense metabolic activity (SUV max equal 17.5). There is adjacent discrete thickening within the sigmoid colon measuring 3.7 by 2.7 cm on image 206 with SUV max equal 14.9. This is favored direct extension from the operator space ill-defined soft tissue mass. There is mild LEFT hydroureter and hydronephrosis on LEFT related to partial obstruction by the LEFT operator space hypermetabolic mass. There is a single focus of hypermetabolic activity along the ventral surface of the RIGHT peritoneal space beneath the RIGHT rectus muscle with SUV max equal 7.2. There is soft tissue thickening at this level on image 176. Incidental CT findings: Post hysterectomy and oophorectomy. Postcholecystectomy SKELETON: No focal hypermetabolic activity to suggest skeletal metastasis. Incidental  CT findings: none IMPRESSION: 1. Complex scan with a combination of hypermetabolic granulomatous disease (sarcoidosis) as well as hypermetabolic metastatic endometrial carcinoma. 2. Hypermetabolic mediastinal hilar lymph nodes and pulmonary nodularity with bronchovascular thickening is consistent with sarcoidosis. 3. Solitary hypermetabolic nodule in the medial LEFT lower lobe, new from prior with intense metabolic activity, is concerning for a metastatic deposit to the LEFT lower lobe. 4. Ill-defined hypermetabolic soft tissue along the LEFT operator space is most consistent with local recurrence of endometrial carcinoma. There is entrapment of the LEFT ureter by this hypermetabolic soft tissue with mild hydroureter and hydronephrosis on the LEFT.  5. Additionally there appears to be direct extension from the hypermetabolic left operator space soft tissue to the sigmoid colon with there is a well-defined hypermetabolic lesion in the colon. 6. More distant hypermetabolic peritoneal metastasis along the ventral peritoneal surface of the RIGHT abdomen beneath RIGHT rectus abdominus muscle. 7. Hypermetabolic lymph nodes in the porta hepatis are favored related to sarcoidosis. These results will be called to the ordering clinician or representative by the Radiologist Assistant, and communication documented in the PACS or [employer]. Electronically Signed   By: Suzy Bouchard M.D.   On: 05/18/2020 15:34

## 2020-05-19 NOTE — Assessment & Plan Note (Signed)
Have reviewed multiple imaging studies with the patient and her sister Unfortunately, PET CT scan is highly suspicious for metastatic cancer to the lung At this point in time, we discussed the role of palliative systemic chemotherapy We discussed various different options Ultimately, she is in agreement to proceed with combination chemotherapy with carboplatin and paclitaxel  We reviewed the NCCN guidelines We discussed the role of chemotherapy. The intent is of palliative intent.  We discussed some of the risks, benefits, side-effects of carboplatin & Taxol. Treatment is intravenous, every 3 weeks x 6 cycles  Some of the short term side-effects included, though not limited to, including weight loss, life threatening infections, risk of allergic reactions, need for transfusions of blood products, nausea, vomiting, change in bowel habits, loss of hair, admission to hospital for various reasons, and risks of death.   Long term side-effects are also discussed including risks of infertility, permanent damage to nerve function, hearing loss, chronic fatigue, kidney damage with possibility needing hemodialysis, and rare secondary malignancy including bone marrow disorders.  The patient is aware that the response rates discussed earlier is not guaranteed.  After a long discussion, patient made an informed decision to proceed with the prescribed plan of care.   Patient education material was dispensed. We discussed premedication with dexamethasone before chemotherapy. I plan to order port placement and chemo education class before we proceed with treatment I plan to see her next week to assess symptom management I will tentatively schedule her to start chemotherapy on June 14

## 2020-05-19 NOTE — Assessment & Plan Note (Signed)
Her pain management has improved with addition of immediate release morphine I recommend discontinuation of gabapentin I will see her again next week to assess pain management

## 2020-05-19 NOTE — Assessment & Plan Note (Signed)
We discussed antiemetics

## 2020-05-19 NOTE — Assessment & Plan Note (Signed)
We discussed the importance of aggressive laxative therapy 

## 2020-05-19 NOTE — Progress Notes (Signed)
Faxed referral to [facility] and also spoke to the triage nurse Mickel Baas.  Appointment given for Dr. Claudia Desanctis at 1:15 today.  Called Keriann and notified her of the appointment.  She verbalized understanding and agreement.

## 2020-05-22 ENCOUNTER — Telehealth: Payer: Self-pay | Admitting: Oncology

## 2020-05-22 ENCOUNTER — Other Ambulatory Visit: Payer: Self-pay | Admitting: Hematology and Oncology

## 2020-05-22 DIAGNOSIS — C775 Secondary and unspecified malignant neoplasm of intrapelvic lymph nodes: Secondary | ICD-10-CM

## 2020-05-22 DIAGNOSIS — C541 Malignant neoplasm of endometrium: Secondary | ICD-10-CM

## 2020-05-22 DIAGNOSIS — C7802 Secondary malignant neoplasm of left lung: Secondary | ICD-10-CM

## 2020-05-22 MED ORDER — DEXAMETHASONE 4 MG PO TABS: ORAL_TABLET | ORAL | 6 refills | Status: DC

## 2020-05-22 MED ORDER — LIDOCAINE-PRILOCAINE 2.5-2.5 % EX CREA: TOPICAL_CREAM | CUTANEOUS | 3 refills | Status: DC

## 2020-05-22 NOTE — Progress Notes (Signed)
START ON PATHWAY REGIMEN - Uterine     A cycle is every 21 days:     Paclitaxel      Carboplatin   **Always confirm dose/schedule in your pharmacy ordering system**  Patient Characteristics: Endometrioid, Recurrent/Progressive Disease, Second Line, Systemic Recurrence, No Previous Chemotherapy Histology: Endometrioid Therapeutic Status: Recurrent or Progressive Disease Line of Therapy: Second Line Time to Recurrence: No Previous Chemotherapy Intent of Therapy: Non-Curative / Palliative Intent, Discussed with Patient

## 2020-05-22 NOTE — Telephone Encounter (Addendum)
[facility] and left a message for Dr. Keane Scrape nurse regarding plan of care.  Also requested office notes from 05/19/20 visit with [facility] in medical records.  Altha Harm, RN from Dr. Keane Scrape office called back and said they will not be placing a stent and will fax the office note to [place].

## 2020-05-22 NOTE — Telephone Encounter (Signed)
Called [name] and advised her of appointment for port placement on 05/26/20 with 8 am arrival, npo after midnight and that she will need a driver.  Also scheduled patient education class for 05/25/20 at 12 pm.  Discussed that Dr. Clabe Seal appointment on 05/24/20 has been canceled due to the recommendation to receive chemotherapy.  She verbalized understanding and agreement.

## 2020-05-23 ENCOUNTER — Other Ambulatory Visit (HOSPITAL_COMMUNITY): Payer: Medicare HMO

## 2020-05-23 ENCOUNTER — Telehealth: Payer: Self-pay | Admitting: Hematology and Oncology

## 2020-05-23 ENCOUNTER — Ambulatory Visit (HOSPITAL_COMMUNITY): Payer: Medicare HMO

## 2020-05-23 NOTE — Telephone Encounter (Signed)
Scheduled per 6/7 sch message. Pt aware of appts. Noted to give pt updated calendar.

## 2020-05-24 ENCOUNTER — Ambulatory Visit: Payer: Medicare HMO

## 2020-05-24 ENCOUNTER — Other Ambulatory Visit: Payer: Self-pay | Admitting: Hematology and Oncology

## 2020-05-24 ENCOUNTER — Ambulatory Visit: Payer: Medicare HMO | Admitting: Radiation Oncology

## 2020-05-25 ENCOUNTER — Telehealth: Payer: Self-pay | Admitting: Oncology

## 2020-05-25 ENCOUNTER — Inpatient Hospital Stay: Payer: Medicare HMO

## 2020-05-25 ENCOUNTER — Other Ambulatory Visit: Payer: Self-pay

## 2020-05-25 ENCOUNTER — Encounter: Payer: Self-pay | Admitting: Hematology and Oncology

## 2020-05-25 ENCOUNTER — Other Ambulatory Visit: Payer: Self-pay | Admitting: Student

## 2020-05-25 NOTE — Progress Notes (Signed)
Called pt to introduce myself as her Arboriculturist and to discuss the [employer].  Unfortunately there aren't any foundations offering copay assistance for her Dx and the type of ins she has.  I attempted to leave a msg but her mailbox is full.  I will plan to meet her on 05/29/20.

## 2020-05-25 NOTE — Telephone Encounter (Signed)
Requested CBC p diff and CMP to be drawn before port placement with [facility] in IR per Dr. Alvy Bimler.

## 2020-05-26 ENCOUNTER — Other Ambulatory Visit: Payer: Self-pay

## 2020-05-26 ENCOUNTER — Ambulatory Visit (HOSPITAL_COMMUNITY)
Admission: RE | Admit: 2020-05-26 | Discharge: 2020-05-26 | Disposition: A | Discharge status: Home / Self Care | Payer: Medicare HMO | Source: Ambulatory Visit | Attending: Hematology and Oncology | Admitting: Hematology and Oncology

## 2020-05-26 ENCOUNTER — Other Ambulatory Visit: Payer: Self-pay | Admitting: Hematology and Oncology

## 2020-05-26 ENCOUNTER — Encounter (HOSPITAL_COMMUNITY): Payer: Self-pay

## 2020-05-26 ENCOUNTER — Ambulatory Visit (HOSPITAL_COMMUNITY)
Admission: RE | Admit: 2020-05-26 | Discharge: 2020-05-26 | Disposition: A | Discharge status: Home / Self Care | Payer: Medicare HMO | Source: Ambulatory Visit | Attending: Gynecologic Oncology | Admitting: Gynecologic Oncology

## 2020-05-26 DIAGNOSIS — Z7951 Long term (current) use of inhaled steroids: Secondary | ICD-10-CM | POA: Insufficient documentation

## 2020-05-26 DIAGNOSIS — I10 Essential (primary) hypertension: Secondary | ICD-10-CM | POA: Insufficient documentation

## 2020-05-26 DIAGNOSIS — C775 Secondary and unspecified malignant neoplasm of intrapelvic lymph nodes: Secondary | ICD-10-CM | POA: Diagnosis not present

## 2020-05-26 DIAGNOSIS — C541 Malignant neoplasm of endometrium: Secondary | ICD-10-CM | POA: Diagnosis not present

## 2020-05-26 DIAGNOSIS — K219 Gastro-esophageal reflux disease without esophagitis: Secondary | ICD-10-CM | POA: Diagnosis not present

## 2020-05-26 DIAGNOSIS — Z880 Allergy status to penicillin: Secondary | ICD-10-CM | POA: Insufficient documentation

## 2020-05-26 DIAGNOSIS — C7802 Secondary malignant neoplasm of left lung: Secondary | ICD-10-CM | POA: Diagnosis not present

## 2020-05-26 DIAGNOSIS — Z801 Family history of malignant neoplasm of trachea, bronchus and lung: Secondary | ICD-10-CM | POA: Diagnosis not present

## 2020-05-26 DIAGNOSIS — Z87891 Personal history of nicotine dependence: Secondary | ICD-10-CM | POA: Diagnosis not present

## 2020-05-26 DIAGNOSIS — Z5111 Encounter for antineoplastic chemotherapy: Secondary | ICD-10-CM | POA: Diagnosis not present

## 2020-05-26 DIAGNOSIS — D860 Sarcoidosis of lung: Secondary | ICD-10-CM | POA: Insufficient documentation

## 2020-05-26 DIAGNOSIS — Z90722 Acquired absence of ovaries, bilateral: Secondary | ICD-10-CM | POA: Insufficient documentation

## 2020-05-26 DIAGNOSIS — Z79899 Other long term (current) drug therapy: Secondary | ICD-10-CM | POA: Diagnosis not present

## 2020-05-26 DIAGNOSIS — Z90710 Acquired absence of both cervix and uterus: Secondary | ICD-10-CM | POA: Diagnosis not present

## 2020-05-26 DIAGNOSIS — Z452 Encounter for adjustment and management of vascular access device: Secondary | ICD-10-CM | POA: Diagnosis not present

## 2020-05-26 HISTORY — PX: IR IMAGING GUIDED PORT INSERTION: IMG5740

## 2020-05-26 LAB — CBC WITH DIFFERENTIAL/PLATELET
Abs Immature Granulocytes: 0.02 10*3/uL (ref 0.00–0.07)
Basophils Absolute: 0 10*3/uL (ref 0.0–0.1)
Basophils Relative: 0 %
Eosinophils Absolute: 0.3 10*3/uL (ref 0.0–0.5)
Eosinophils Relative: 3 %
HCT: 31.6 % — ABNORMAL LOW (ref 36.0–46.0)
Hemoglobin: 10.9 g/dL — ABNORMAL LOW (ref 12.0–15.0)
Immature Granulocytes: 0 %
Lymphocytes Relative: 14 %
Lymphs Abs: 1.1 10*3/uL (ref 0.7–4.0)
MCH: 32.3 pg (ref 26.0–34.0)
MCHC: 34.5 g/dL (ref 30.0–36.0)
MCV: 93.8 fL (ref 80.0–100.0)
Monocytes Absolute: 0.7 10*3/uL (ref 0.1–1.0)
Monocytes Relative: 9 %
Neutro Abs: 5.5 10*3/uL (ref 1.7–7.7)
Neutrophils Relative %: 74 %
Platelets: 218 10*3/uL (ref 150–400)
RBC: 3.37 MIL/uL — ABNORMAL LOW (ref 3.87–5.11)
RDW: 14 % (ref 11.5–15.5)
WBC: 7.5 10*3/uL (ref 4.0–10.5)
nRBC: 0 % (ref 0.0–0.2)

## 2020-05-26 LAB — COMPREHENSIVE METABOLIC PANEL
ALT: 24 U/L (ref 0–44)
AST: 21 U/L (ref 15–41)
Albumin: 4 g/dL (ref 3.5–5.0)
Alkaline Phosphatase: 128 U/L — ABNORMAL HIGH (ref 38–126)
Anion gap: 9 (ref 5–15)
BUN: 15 mg/dL (ref 8–23)
CO2: 30 mmol/L (ref 22–32)
Calcium: 9.2 mg/dL (ref 8.9–10.3)
Chloride: 100 mmol/L (ref 98–111)
Creatinine, Ser: 1.14 mg/dL — ABNORMAL HIGH (ref 0.44–1.00)
GFR calc Af Amer: >60 mL/min (ref >60)
GFR calc non Af Amer: 52 mL/min — ABNORMAL LOW (ref >60)
Glucose, Bld: 131 mg/dL — ABNORMAL HIGH (ref 70–99)
Potassium: 4 mmol/L (ref 3.5–5.1)
Sodium: 139 mmol/L (ref 135–145)
Total Bilirubin: 0.9 mg/dL (ref 0.3–1.2)
Total Protein: 8.4 g/dL — ABNORMAL HIGH (ref 6.5–8.1)

## 2020-05-26 MED ORDER — VANCOMYCIN HCL IN DEXTROSE 1-5 GM/200ML-% IV SOLN
INTRAVENOUS | Status: AC
  Administered 2020-05-26: 1000 mg via INTRAVENOUS
  Filled 2020-05-26: qty 200

## 2020-05-26 MED ORDER — MIDAZOLAM HCL 2 MG/2ML IJ SOLN
INTRAMUSCULAR | Status: AC | PRN
  Administered 2020-05-26 (×4): 1 mg via INTRAVENOUS

## 2020-05-26 MED ORDER — LIDOCAINE-EPINEPHRINE 1 %-1:100000 IJ SOLN
INTRAMUSCULAR | Status: AC
  Filled 2020-05-26: qty 1

## 2020-05-26 MED ORDER — MIDAZOLAM HCL 2 MG/2ML IJ SOLN
INTRAMUSCULAR | Status: AC
  Filled 2020-05-26: qty 4

## 2020-05-26 MED ORDER — FENTANYL CITRATE (PF) 100 MCG/2ML IJ SOLN
INTRAMUSCULAR | Status: AC | PRN
  Administered 2020-05-26 (×2): 50 ug via INTRAVENOUS

## 2020-05-26 MED ORDER — SODIUM CHLORIDE 0.9 % IV SOLN: INTRAVENOUS | Status: DC

## 2020-05-26 MED ORDER — LIDOCAINE-EPINEPHRINE (PF) 1 %-1:200000 IJ SOLN
INTRAMUSCULAR | Status: AC | PRN
  Administered 2020-05-26: 10 mL

## 2020-05-26 MED ORDER — VANCOMYCIN HCL IN DEXTROSE 1-5 GM/200ML-% IV SOLN: 1000.0000 mg | Freq: Once | INTRAVENOUS | Status: AC

## 2020-05-26 MED ORDER — FENTANYL CITRATE (PF) 100 MCG/2ML IJ SOLN
INTRAMUSCULAR | Status: AC
  Filled 2020-05-26: qty 2

## 2020-05-26 MED ORDER — HEPARIN SOD (PORK) LOCK FLUSH 100 UNIT/ML IV SOLN
INTRAVENOUS | Status: AC
  Filled 2020-05-26: qty 5

## 2020-05-26 NOTE — Discharge Instructions (Signed)
DO NOT APPLY ANY EMLA CREAM OR LOTIONS OVER THE PORT SITE X  2 WEEKS   DO NOT SHOWER X 24 HRS  FOR ANY QUESTIONS OR CONCERNS CALL [phone]   Implanted Port Insertion, Care After This sheet gives you information about how to care for yourself after your procedure. Your health care provider may also give you more specific instructions. If you have problems or questions, contact your health care provider. What can I expect after the procedure? After the procedure, it is common to have:  Discomfort at the port insertion site.  Bruising on the skin over the port. This should improve over 3-4 days. Follow these instructions at home: [facility] care  After your port is placed, you will get a manufacturer's information card. The card has information about your port. Keep this card with you at all times.  Take care of the port as told by your health care provider. Ask your health care provider if you or a family member can get training for taking care of the port at home. A home health care nurse may also take care of the port.  Make sure to remember what type of port you have. Incision care      Follow instructions from your health care provider about how to take care of your port insertion site. Make sure you: ? Wash your hands with soap and water before and after you change your bandage (dressing). If soap and water are not available, use hand sanitizer. ? Change your dressing as told by your health care provider. ? Leave stitches (sutures), skin glue, or adhesive strips in place. These skin closures may need to stay in place for 2 weeks or longer. If adhesive strip edges start to loosen and curl up, you may trim the loose edges. Do not remove adhesive strips completely unless your health care provider tells you to do that.  Check your port insertion site every day for signs of infection. Check for: ? Redness, swelling, or pain. ? Fluid or blood. ? Warmth. ? Pus or a bad  smell. Activity  Return to your normal activities as told by your health care provider. Ask your health care provider what activities are safe for you.  Do not lift anything that is heavier than 10 lb (4.5 kg), or the limit that you are told, until your health care provider says that it is safe. General instructions  Take over-the-counter and prescription medicines only as told by your health care provider.  Do not take baths, swim, or use a hot tub until your health care provider approves. Ask your health care provider if you may take showers. You may only be allowed to take sponge baths.  Do not drive for 24 hours if you were given a sedative during your procedure.  Wear a medical alert bracelet in case of an emergency. This will tell any health care providers that you have a port.  Keep all follow-up visits as told by your health care provider. This is important. Contact a health care provider if:  You cannot flush your port with saline as directed, or you cannot draw blood from the port.  You have a fever or chills.  You have redness, swelling, or pain around your port insertion site.  You have fluid or blood coming from your port insertion site.  Your port insertion site feels warm to the touch.  You have pus or a bad smell coming from the port insertion site. Get help right  away if:  You have chest pain or shortness of breath.  You have bleeding from your port that you cannot control. Summary  Take care of the port as told by your health care provider. Keep the manufacturer's information card with you at all times.  Change your dressing as told by your health care provider.  Contact a health care provider if you have a fever or chills or if you have redness, swelling, or pain around your port insertion site.  Keep all follow-up visits as told by your health care provider. This information is not intended to replace advice given to you by your health care provider.  Make sure you discuss any questions you have with your health care provider. Document Revised: 06/30/2018 Document Reviewed: 06/30/2018 Elsevier Patient Education  [place]. Moderate Conscious Sedation, Adult, Care After These instructions provide you with information about caring for yourself after your procedure. Your health care provider may also give you more specific instructions. Your treatment has been planned according to current medical practices, but problems sometimes occur. Call your health care provider if you have any problems or questions after your procedure. What can I expect after the procedure? After your procedure, it is common:  To feel sleepy for several hours.  To feel clumsy and have poor balance for several hours.  To have poor judgment for several hours.  To vomit if you eat too soon. Follow these instructions at home: For at least 24 hours after the procedure:   Do not: ? Participate in activities where you could fall or become injured. ? Drive. ? Use heavy machinery. ? Drink alcohol. ? Take sleeping pills or medicines that cause drowsiness. ? Make important decisions or sign legal documents. ? Take care of children on your own.  Rest. Eating and drinking  Follow the diet recommended by your health care provider.  If you vomit: ? Drink water, juice, or soup when you can drink without vomiting. ? Make sure you have little or no nausea before eating solid foods. General instructions  Have a responsible adult stay with you until you are awake and alert.  Take over-the-counter and prescription medicines only as told by your health care provider.  If you smoke, do not smoke without supervision.  Keep all follow-up visits as told by your health care provider. This is important. Contact a health care provider if:  You keep feeling nauseous or you keep vomiting.  You feel light-headed.  You develop a rash.  You have a fever. Get help  right away if:  You have trouble breathing. This information is not intended to replace advice given to you by your health care provider. Make sure you discuss any questions you have with your health care provider. Document Revised: 11/14/2017 Document Reviewed: 03/23/2016 Elsevier Patient Education  2020 [employer].

## 2020-05-26 NOTE — Consult Note (Signed)
Chief Complaint: Chemotherapy access  Referring Physician(s): Gorsuch,Ni  Supervising Physician: Sandi Mariscal  Patient Status: [facility] - Out-pt  History of Present Illness: [name] is a 64 y.o. female History of recurrent uterine cancer s/p laparoscopic total hysterectomy with bilateral salpongoophorectomy. Team is requesting portacath placement for chemotherapy access .Patient previously had a left subclavian portacath placed by surgery at OSH     Past Medical History:  Diagnosis Date  . Bifid uvula   . Blood transfusion without reported diagnosis   . GERD (gastroesophageal reflux disease)   . History of cervical dysplasia   . History of chemotherapy    06/ 2005  . History of cleft lip    CORRECTED  . History of condyloma acuminatum    PERIANAL  . History of radiation therapy    completed 06/ 2005  . History of vulvar dysplasia   . Hypertension   . 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 Pulmonary nodule, left   . 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 Sarcoid uveitis of left eye goes to [facility]--  currently no issues per pt 11-27-2016  . 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  ChemoXRT 2005 Dr Collier Salina Rubin/[facility]  . Upper airway cough syndrome   . Wears glasses     Past Surgical History:  Procedure Laterality Date  . BRONCHOSCOPY  01/22/2008   w/ Lavage and bronchial bx  . CARDIOVASCULAR STRESS TEST  06/27/2006   normal nuclear study w/ no ischemia/  normal LV function and wall motion , ef 61%  . CARPAL TUNNEL RELEASE Right 01/17/2016   and finger trigger release  . CLEFT LIP REPAIR    . CO2 LASER ABLATION VULVA    . EUA/  EXCISIONAL MULTIPLE PERINEAL/ANAL BX'S/  SIGMOIDSCOPY/  PORT-A-CATH PLACEMENT  03/27/2004   Local excision - Dr Lennie Hummer  . LAPAROSCOPIC CHOLECYSTECTOMY  08/14/2008  . MULTIPLE ANAL BX'S  10/10/2006  . RELEASE A1 PULLEY  THUMB Bilateral left 12-17-2006/  right 09-05-2009  . REMOVAL PORT-A-CATH/  EUA/  MULTIPLE ANAL BX'S  10/16/2004  . ROBOTIC ASSISTED TOTAL HYSTERECTOMY WITH BILATERAL SALPINGO OOPHERECTOMY Bilateral 06/25/2018   Procedure: XI ROBOTIC ASSISTED TOTAL HYSTERECTOMY WITH BILATERAL SALPINGO OOPHORECTOMY;  Surgeon: Everitt Amber, MD;  Location: [facility];  Service: Gynecology;  Laterality: Bilateral;  . SENTINEL NODE BIOPSY N/A 06/25/2018   Procedure: SENTINEL NODE BIOPSY;  Surgeon: Everitt Amber, MD;  Location: [facility];  Service: Gynecology;  Laterality: N/A;  . TENNIS ELBOW RELEASE/NIRSCHEL PROCEDURE Right 11/28/2016   Procedure: RIGHT ELBOW ULNER NERVE RELEASE AND OR TRANSPOSITION;  Surgeon: Iran Planas, MD;  Location: [place];  Service: Orthopedics;  Laterality: Right;    Allergies: Shellfish allergy and Penicillins  Medications: Prior to Admission medications   Medication Sig Start Date End Date Taking? Authorizing Provider  bisoprolol (ZEBETA) 10 MG tablet TAKE 1 TABLET TWICE DAILY 11/29/19  Yes Tanda Rockers, MD  desonide (DESOWEN) 0.05 % lotion Apply topically 2 (two) times daily. Continue following with dermatologist 09/29/19  Yes [name], [name], MD  dexamethasone (DECADRON) 4 MG tablet Take 2 tabs at the night before and 2 tabs the morning of chemotherapy, every 3 weeks, by mouth 05/22/20  Yes [name], MD  linaclotide (LINZESS) 145 MCG CAPS capsule Take 1 capsule (145 mcg total) by mouth daily before breakfast. 03/06/20  Yes [name], Betty  G, MD  loratadine (CLARITIN) 10 MG tablet Take 10 mg by mouth daily as needed for allergies.   Yes [provider]  morphine (MSIR) 15 MG tablet Take 1 tablet (15 mg total) by mouth every 4 (four) hours as needed for severe pain. 05/11/20  Yes Gorsuch, Ni, MD  ondansetron (ZOFRAN) 8 MG tablet Take 1 tablet (8 mg total) by mouth every 8 (eight) hours as needed for nausea. 05/19/20  Yes [name], MD  predniSONE (DELTASONE) 10 MG tablet  Take 0.5 tablets (5 mg total) by mouth daily with breakfast. 07/19/19  Yes Tanda Rockers, MD  prochlorperazine (COMPAZINE) 10 MG tablet Take 1 tablet (10 mg total) by mouth every 6 (six) hours as needed for nausea or vomiting. 05/19/20  Yes [name], MD  RABEprazole (ACIPHEX) 20 MG tablet Take 1 tablet (20 mg total) by mouth daily before breakfast. 07/07/19  Yes Tanda Rockers, MD  simvastatin (ZOCOR) 20 MG tablet Take 1 tablet (20 mg total) by mouth at bedtime. 10/01/19  Yes [name], [name], MD  vitamin C (ASCORBIC ACID) 500 MG tablet Take 500 mg by mouth daily.   Yes [provider]  albuterol (VENTOLIN HFA) 108 (90 Base) MCG/ACT inhaler Inhale 2 puffs into the lungs every 4 (four) hours as needed for wheezing or shortness of breath. 07/09/19   Tanda Rockers, MD  budesonide-formoterol (SYMBICORT) 80-4.5 MCG/ACT inhaler Inhale 2 puffs into the lungs 2 (two) times daily. 07/07/19   Tanda Rockers, MD  dextromethorphan (DELSYM) 30 MG/5ML liquid Take by mouth as needed for cough.     [provider]  hydrochlorothiazide (HYDRODIURIL) 25 MG tablet TAKE 1 TABLET EVERY DAY 05/12/20   Tanda Rockers, MD  hydrocortisone 2.5 % cream Apply 1 application topically 2 (two) times daily.    [provider]  lidocaine-prilocaine (EMLA) cream Apply to affected area once 05/22/20   [name], MD     Family History  Problem Relation Age of Onset  . Allergies Mother   . Asthma Mother   . Lung cancer Mother   . Allergies Sister   . Allergies Brother   . Asthma Brother   . Asthma Sister   . Sarcoidosis Neg Hx   . Colon cancer Neg Hx   . Liver cancer Neg Hx   . Stomach cancer Neg Hx   . Esophageal cancer Neg Hx   . Rectal cancer Neg Hx     Social History   Socioeconomic History  . Marital status: Single    Spouse name: Not on file  . Number of children: 2  . Years of education: Not on file  . Highest education level: Not on file  Occupational History  . 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 Smoking status: Former Smoker    Packs/day: 0.50    Years: 1.00    Pack years: 0.50    Types: Cigarettes    Quit date: 12/16/1993    Years since quitting: 26.4  . Smokeless tobacco: Never Used  Vaping Use  . Vaping Use: Never used  Substance and Sexual Activity  . Alcohol use: Yes    Comment: occasional  . Drug use: No  . Sexual activity: Never  Other Topics Concern  . Not on file  Social History Narrative  . Not on file   Social Determinants of Health   Financial Resource Strain:   . Difficulty of Paying Living Expenses:  Food Insecurity:   . Worried About Charity fundraiser in the Last Year:   . Arboriculturist in the Last Year:   Transportation Needs:   . Film/video editor (Medical):   Marland Kitchen Lack of Transportation (Non-Medical):   Physical Activity:   . Days of Exercise per Week:   . Minutes of Exercise per Session:   Stress:   . Feeling of Stress :   Social Connections:   . Frequency of Communication with Friends and Family:   . Frequency of Social Gatherings with Friends and Family:   . Attends Religious Services:   . Active Member of Clubs or Organizations:   . Attends Archivist Meetings:   Marland Kitchen Marital Status:      Review of Systems: A 12 point ROS discussed and pertinent positives are indicated in the HPI above.  All other systems are negative.  Review of Systems  Constitutional: Negative for fatigue and fever.  HENT: Negative for congestion.   Respiratory: Negative for cough and shortness of breath.   Gastrointestinal: Positive for nausea. Negative for abdominal pain, diarrhea and vomiting.    Vital Signs: BP (!) 178/88 (BP Location: Right Arm)   Pulse 92   Temp 98.4 F (36.9 C) (Oral)   Resp 18   LMP 12/17/2003   SpO2 98%   Physical Exam Vitals and nursing note reviewed.  Constitutional:      Appearance: She is well-developed.  HENT:     Head: Normocephalic and atraumatic.    Eyes:     Conjunctiva/sclera: Conjunctivae normal.  Cardiovascular:     Rate and Rhythm: Normal rate and regular rhythm.     Heart sounds: Normal heart sounds.  Pulmonary:     Effort: Pulmonary effort is normal.     Breath sounds: Normal breath sounds.  Musculoskeletal:        General: Normal range of motion.     Cervical back: Normal range of motion.  Skin:    General: Skin is warm.  Neurological:     Mental Status: She is alert and oriented to person, place, and time.     Imaging: DG Tibia/Fibula Left  Result Date: 05/10/2020 CLINICAL DATA:  Left lower leg pain for 3 months. EXAM: LEFT TIBIA AND FIBULA - 2 VIEW COMPARISON:  Left foot radiographs 12/25/2012 FINDINGS: No fracture or destructive osseous lesion is identified. The knee and ankle are located. Small posterior and large plantar calcaneal enthesophytes are again seen with the latter mildly enlarging since 2014. There is mild nonspecific subcutaneous fat reticulation in the lower leg. IMPRESSION: No acute osseous abnormality identified. Electronically Signed   By: Logan Bores M.D.   On: 05/10/2020 15:03   CT Chest W Contrast  Result Date: 05/18/2020 CLINICAL DATA:  Vaginal mass in lower extremity edema. History of endometrial cancer, suspected recurrence. Also with history of sarcoidosis. EXAM: CT CHEST, ABDOMEN, AND PELVIS WITH CONTRAST TECHNIQUE: Multidetector CT imaging of the chest, abdomen and pelvis was performed following the standard protocol during bolus administration of intravenous contrast. CONTRAST:  135mL OMNIPAQUE IOHEXOL 300 MG/ML  SOLN COMPARISON:  07/20/2018 and PET-CT 05/18/2020 FINDINGS: CT CHEST FINDINGS Cardiovascular: Calcified and noncalcified plaque throughout the thoracic aorta. Heart size enlarged but stable. Particularly with LEFT atrial enlargement. Calcified coronary artery disease. Main pulmonary artery 3.2 cm. Pulmonary arterial evaluation otherwise unremarkable on venous phase imaging.  Mediastinum/Nodes: No axillary lymphadenopathy. Bulky mediastinal lymph nodes displaying calcification in the setting of known sarcoidosis. Also with hilar  nodal enlargement displaying calcification. Lungs/Pleura: New pulmonary nodule in the RIGHT mid chest in the RIGHT upper lobe (image 59, series 4) 4 mm. Signs of fibrotic lung disease perhaps with some worsening since the previous study which was limited by respiratory motion. There were basilar nodules noted previously though there is a nodule at the medial LEFT lung base that measures approximately 1.4 x 1.4 cm that displays increased FDG uptake and there are other small nodules at the RIGHT lung base that were not definitively seen on the prior study, for instance a small nodule on image 92 of series 4 measures 6 mm and a similar size nodule on image 86 of series 4 also in the RIGHT lower lobe. At least 3 new nodules in the RIGHT lower lobe since previous imaging. New nodule in the LEFT chest on image 80 of series 4 in the LEFT lower lobe at 8 mm. Airways are patent. Musculoskeletal: No chest wall mass. See below for full musculoskeletal details. CT ABDOMEN PELVIS FINDINGS Hepatobiliary: Lobular hepatic contours. No focal suspicious hepatic lesion. Portal vein is patent. Post cholecystectomy without biliary ductal dilation. Pancreas: Pancreas is normal without focal lesion or ductal dilation. No pancreatic inflammation. Spleen: Spleen normal size without focal lesion. Adrenals/Urinary Tract: Adrenal glands are normal. Smooth renal contours but with delayed enhancement of the LEFT kidney as compared to the RIGHT displaying mild to moderate LEFT hydroureteronephrosis. Obstruction of the LEFT ureter secondary to LEFT pelvic sidewall and vaginal apex mass measuring 3.6 by 4.2 cm (image 98, series 2) Adjacent colonic thickening of the sigmoid colon likely reflects involvement of the colon as well. Stomach/Bowel: Direct colonic involvement of the sigmoid colon as  described. The appendix is normal. No sign of small bowel obstruction. Vascular/Lymphatic: Calcified and noncalcified atheromatous plaque of the abdominal aorta without sign of aneurysm. No adenopathy by size criteria in the retroperitoneum. Peri hepatic lymph node with mild enlargement may show some subtle calcification, potentially related to the patient's sarcoidosis. LEFT pelvic sidewall mass invades the obturator muscle and encases vascular structures and LEFT ureter. Mildly enlarged LEFT inguinal lymph nodes. No gross FDG uptake on previous PET scan. Reproductive: Post hysterectomy with pelvic mass as described. The nodularity of the pericolonic and perirectal fat. Fascial thickening in the pelvis and thickening of mesenteric reflections. Ill-defined nodule in the anterior peritoneum of the lower abdomen/pelvis (image 89, series 2) indicative of peritoneal metastasis. Other: As above with peritoneal disease. Also with body wall involvement of the RIGHT rectus muscle as seen on the PET scan (image 78, series 2) Musculoskeletal: No destructive bone process. No acute bone finding. Spinal degenerative changes. IMPRESSION: 1. Mass of the LEFT vaginal apex extending to the LEFT pelvic sidewall, invading the LEFT obturator muscle, extending and involving the colon as well as obstructing the distal LEFT ureter. 2. LEFT ureteral obstruction with mild to moderate hydroureteronephrosis and delayed enhancement. 3. Signs of peritoneal and body wall involvement. 4. Signs of pulmonary metastatic disease particularly the dominant LEFT lower lobe nodule. There are other nodules that are new, this is the most suspicious of pulmonary nodules in the chest. 5. Signs of fibrotic lung disease perhaps with some worsening since the previous study which was limited by respiratory motion. 6. Signs of sarcoidosis in addition to fibrotic lung disease with enlarged and calcified lymph nodes in the chest with similar appearance. 7. Aortic  atherosclerosis. These results will be called to the ordering clinician or representative by the Radiologist Assistant, and communication documented in the  PACS or [employer]. Aortic Atherosclerosis (ICD10-I70.0). Electronically Signed   By: Zetta Bills M.D.   On: 05/18/2020 17:52   CT Abdomen Pelvis W Contrast  Result Date: 05/18/2020 CLINICAL DATA:  Vaginal mass in lower extremity edema. History of endometrial cancer, suspected recurrence. Also with history of sarcoidosis. EXAM: CT CHEST, ABDOMEN, AND PELVIS WITH CONTRAST TECHNIQUE: Multidetector CT imaging of the chest, abdomen and pelvis was performed following the standard protocol during bolus administration of intravenous contrast. CONTRAST:  143mL OMNIPAQUE IOHEXOL 300 MG/ML  SOLN COMPARISON:  07/20/2018 and PET-CT 05/18/2020 FINDINGS: CT CHEST FINDINGS Cardiovascular: Calcified and noncalcified plaque throughout the thoracic aorta. Heart size enlarged but stable. Particularly with LEFT atrial enlargement. Calcified coronary artery disease. Main pulmonary artery 3.2 cm. Pulmonary arterial evaluation otherwise unremarkable on venous phase imaging. Mediastinum/Nodes: No axillary lymphadenopathy. Bulky mediastinal lymph nodes displaying calcification in the setting of known sarcoidosis. Also with hilar nodal enlargement displaying calcification. Lungs/Pleura: New pulmonary nodule in the RIGHT mid chest in the RIGHT upper lobe (image 59, series 4) 4 mm. Signs of fibrotic lung disease perhaps with some worsening since the previous study which was limited by respiratory motion. There were basilar nodules noted previously though there is a nodule at the medial LEFT lung base that measures approximately 1.4 x 1.4 cm that displays increased FDG uptake and there are other small nodules at the RIGHT lung base that were not definitively seen on the prior study, for instance a small nodule on image 92 of series 4 measures 6 mm and a similar size nodule on  image 86 of series 4 also in the RIGHT lower lobe. At least 3 new nodules in the RIGHT lower lobe since previous imaging. New nodule in the LEFT chest on image 80 of series 4 in the LEFT lower lobe at 8 mm. Airways are patent. Musculoskeletal: No chest wall mass. See below for full musculoskeletal details. CT ABDOMEN PELVIS FINDINGS Hepatobiliary: Lobular hepatic contours. No focal suspicious hepatic lesion. Portal vein is patent. Post cholecystectomy without biliary ductal dilation. Pancreas: Pancreas is normal without focal lesion or ductal dilation. No pancreatic inflammation. Spleen: Spleen normal size without focal lesion. Adrenals/Urinary Tract: Adrenal glands are normal. Smooth renal contours but with delayed enhancement of the LEFT kidney as compared to the RIGHT displaying mild to moderate LEFT hydroureteronephrosis. Obstruction of the LEFT ureter secondary to LEFT pelvic sidewall and vaginal apex mass measuring 3.6 by 4.2 cm (image 98, series 2) Adjacent colonic thickening of the sigmoid colon likely reflects involvement of the colon as well. Stomach/Bowel: Direct colonic involvement of the sigmoid colon as described. The appendix is normal. No sign of small bowel obstruction. Vascular/Lymphatic: Calcified and noncalcified atheromatous plaque of the abdominal aorta without sign of aneurysm. No adenopathy by size criteria in the retroperitoneum. Peri hepatic lymph node with mild enlargement may show some subtle calcification, potentially related to the patient's sarcoidosis. LEFT pelvic sidewall mass invades the obturator muscle and encases vascular structures and LEFT ureter. Mildly enlarged LEFT inguinal lymph nodes. No gross FDG uptake on previous PET scan. Reproductive: Post hysterectomy with pelvic mass as described. The nodularity of the pericolonic and perirectal fat. Fascial thickening in the pelvis and thickening of mesenteric reflections. Ill-defined nodule in the anterior peritoneum of the lower  abdomen/pelvis (image 89, series 2) indicative of peritoneal metastasis. Other: As above with peritoneal disease. Also with body wall involvement of the RIGHT rectus muscle as seen on the PET scan (image 78, series 2) Musculoskeletal: No destructive  bone process. No acute bone finding. Spinal degenerative changes. IMPRESSION: 1. Mass of the LEFT vaginal apex extending to the LEFT pelvic sidewall, invading the LEFT obturator muscle, extending and involving the colon as well as obstructing the distal LEFT ureter. 2. LEFT ureteral obstruction with mild to moderate hydroureteronephrosis and delayed enhancement. 3. Signs of peritoneal and body wall involvement. 4. Signs of pulmonary metastatic disease particularly the dominant LEFT lower lobe nodule. There are other nodules that are new, this is the most suspicious of pulmonary nodules in the chest. 5. Signs of fibrotic lung disease perhaps with some worsening since the previous study which was limited by respiratory motion. 6. Signs of sarcoidosis in addition to fibrotic lung disease with enlarged and calcified lymph nodes in the chest with similar appearance. 7. Aortic atherosclerosis. These results will be called to the ordering clinician or representative by the Radiologist Assistant, and communication documented in the PACS or [employer]. Aortic Atherosclerosis (ICD10-I70.0). Electronically Signed   By: Zetta Bills M.D.   On: 05/18/2020 17:52   NM PET Image Initial (PI) Skull Base To Thigh  Result Date: 05/18/2020 CLINICAL DATA:  Subsequent treatment strategy for uterine carcinoma. Additional history of anal carcinoma EXAM: NUCLEAR MEDICINE PET SKULL BASE TO THIGH TECHNIQUE: 11.6 mCi F-18 FDG was injected intravenously. Full-ring PET imaging was performed from the skull base to thigh after the radiotracer. CT data was obtained and used for attenuation correction and anatomic localization. Fasting blood glucose: 128 mg/dl COMPARISON:  CT same day  05/18/2020, CT 01/19/2020 FINDINGS: Mediastinal blood pool activity: SUV max 3.1 Liver activity: SUV max NA NECK: No hypermetabolic lymph nodes in the neck. Incidental CT findings: none CHEST: There multiple bilateral symmetric hypermetabolic lymph nodes Example RIGHT lower paratracheal lymph node SUV max equal 8.1. These lymph nodes have a central calcifications. Lymph nodes are rather bulky. For example RIGHT lower paratracheal node measures 2 cm in diameter. Subcarinal node measuring 1.6 cm. Adenopathy extends into the hila. Review of the lung parenchyma demonstrates peribronchial thickening and multiple discrete pulmonary nodules at the lung bases. For the most part these lung nodules do not have associated metabolic activity. There is 1 nodule in the posteromedial LEFT lower lobe which is larger measuring 18 mm (image 73/4) and with intense metabolic activity SUV max equal 14.3. This is greater metabolic activity than the mediastinal lymph nodes. In comparison to CT 07/20/2018, there is similar calcified mediastinal adenopathy and peripheral nodularity with peribronchovascular thickening all consistent with pulmonary sarcoidosis. However, the hypermetabolic medial LEFT lobe nodule is not present in 2019. Incidental CT findings: none ABDOMEN/PELVIS: Hypermetabolic a periportal lymph nodes with subtle calcifications are favored relate to presumed sarcoidosis in the chest. Along the LEFT operator space and along the path of the LEFT ureter there is hypermetabolic soft tissue thickening (4.0 x 2.0 cm, image 208) with intense metabolic activity (SUV max equal 17.5). There is adjacent discrete thickening within the sigmoid colon measuring 3.7 by 2.7 cm on image 206 with SUV max equal 14.9. This is favored direct extension from the operator space ill-defined soft tissue mass. There is mild LEFT hydroureter and hydronephrosis on LEFT related to partial obstruction by the LEFT operator space hypermetabolic mass. There  is a single focus of hypermetabolic activity along the ventral surface of the RIGHT peritoneal space beneath the RIGHT rectus muscle with SUV max equal 7.2. There is soft tissue thickening at this level on image 176. Incidental CT findings: Post hysterectomy and oophorectomy. Postcholecystectomy SKELETON: No focal  hypermetabolic activity to suggest skeletal metastasis. Incidental CT findings: none IMPRESSION: 1. Complex scan with a combination of hypermetabolic granulomatous disease (sarcoidosis) as well as hypermetabolic metastatic endometrial carcinoma. 2. Hypermetabolic mediastinal hilar lymph nodes and pulmonary nodularity with bronchovascular thickening is consistent with sarcoidosis. 3. Solitary hypermetabolic nodule in the medial LEFT lower lobe, new from prior with intense metabolic activity, is concerning for a metastatic deposit to the LEFT lower lobe. 4. Ill-defined hypermetabolic soft tissue along the LEFT operator space is most consistent with local recurrence of endometrial carcinoma. There is entrapment of the LEFT ureter by this hypermetabolic soft tissue with mild hydroureter and hydronephrosis on the LEFT. 5. Additionally there appears to be direct extension from the hypermetabolic left operator space soft tissue to the sigmoid colon with there is a well-defined hypermetabolic lesion in the colon. 6. More distant hypermetabolic peritoneal metastasis along the ventral peritoneal surface of the RIGHT abdomen beneath RIGHT rectus abdominus muscle. 7. Hypermetabolic lymph nodes in the porta hepatis are favored related to sarcoidosis. These results will be called to the ordering clinician or representative by the Radiologist Assistant, and communication documented in the PACS or [employer]. Electronically Signed   By: Suzy Bouchard M.D.   On: 05/18/2020 15:34    Labs:  CBC: Recent Labs    08/09/19 1558 01/14/20 1204  WBC 8.3 7.3  HGB 11.7* 11.8*  HCT 34.0* 34.6*  PLT 223 176.0     COAGS: No results for input(s): INR, APTT in the last 8760 hours.  BMP: Recent Labs    08/09/19 1558 01/14/20 1204 03/01/20 1053 05/10/20 1535  NA 143 138  --  142  K 3.9 3.4* 3.6 3.7  CL 105 100  --  103  CO2 29 31  --  30  GLUCOSE 109* 119*  --  108*  BUN 15 13  --  21  CALCIUM 9.7 9.3  --  10.0  CREATININE 0.86 0.76  --  0.90  GFRNONAA >60  --   --  >60  GFRAA >60  --   --  >60    LIVER FUNCTION TESTS: Recent Labs    08/09/19 1558 01/14/20 1204  BILITOT 0.5 0.6  AST 25 17  ALT 28 19  ALKPHOS 116 92  PROT 8.1 8.1  ALBUMIN 3.6 4.0     Assessment and Plan:  64 y.o, female outpatient. History of recurrent uterine cancer s/p laparoscopic total hysterectomy with bilateral salpongoophorectomy. Team is requesting portacath placement for chemotherapy access. Patient previously had a left subclavian portacath placed by surgery at OSH   Pertinent Imaging 6.3.21 - CT chest   Pertinent IR History none  Pertinent Allergies Shellfish PCN   All labs and medications are within acceptable parameters.  Patient is afebrile.  Risks and benefits of image guided port-a-catheter placement was discussed with the patient including, but not limited to bleeding, infection, pneumothorax, or fibrin sheath development and need for additional procedures.  All of the patient's questions were answered, patient is agreeable to proceed. Consent signed and in chart.      Thank you for this interesting consult.  I greatly enjoyed meeting [name] and look forward to participating in their care.  A copy of this report was sent to the requesting provider on this date.  Electronically Signed: Avel Peace, NP 05/26/2020, 8:57 AM   I spent a total of  40 Minutes   in face to face in clinical consultation, greater than 50% of which was counseling/coordinating care  for portacath placement

## 2020-05-26 NOTE — Procedures (Signed)
Pre Procedure Dx: Poor venous access Post Procedural Dx: Same  Successful placement of right IJ approach port-a-cath with tip at the superior caval atrial junction. The catheter is ready for immediate use.  Estimated Blood Loss: Minimal  Complications: None immediate.  Jay Suriya Kovarik, MD Pager #: [phone]   

## 2020-05-29 ENCOUNTER — Other Ambulatory Visit: Payer: Self-pay

## 2020-05-29 ENCOUNTER — Other Ambulatory Visit: Payer: Self-pay | Admitting: Oncology

## 2020-05-29 ENCOUNTER — Inpatient Hospital Stay: Payer: Medicare HMO

## 2020-05-29 VITALS — BP 166/80 | HR 59 | Temp 98.2°F | Resp 18

## 2020-05-29 DIAGNOSIS — R110 Nausea: Secondary | ICD-10-CM | POA: Diagnosis not present

## 2020-05-29 DIAGNOSIS — N131 Hydronephrosis with ureteral stricture, not elsewhere classified: Secondary | ICD-10-CM | POA: Diagnosis not present

## 2020-05-29 DIAGNOSIS — G893 Neoplasm related pain (acute) (chronic): Secondary | ICD-10-CM | POA: Diagnosis not present

## 2020-05-29 DIAGNOSIS — D869 Sarcoidosis, unspecified: Secondary | ICD-10-CM | POA: Diagnosis not present

## 2020-05-29 DIAGNOSIS — Z90710 Acquired absence of both cervix and uterus: Secondary | ICD-10-CM | POA: Diagnosis not present

## 2020-05-29 DIAGNOSIS — C775 Secondary and unspecified malignant neoplasm of intrapelvic lymph nodes: Secondary | ICD-10-CM

## 2020-05-29 DIAGNOSIS — Z90722 Acquired absence of ovaries, bilateral: Secondary | ICD-10-CM | POA: Diagnosis not present

## 2020-05-29 DIAGNOSIS — Z5111 Encounter for antineoplastic chemotherapy: Secondary | ICD-10-CM | POA: Diagnosis not present

## 2020-05-29 DIAGNOSIS — C7802 Secondary malignant neoplasm of left lung: Secondary | ICD-10-CM

## 2020-05-29 DIAGNOSIS — K5909 Other constipation: Secondary | ICD-10-CM | POA: Diagnosis not present

## 2020-05-29 DIAGNOSIS — C541 Malignant neoplasm of endometrium: Secondary | ICD-10-CM | POA: Diagnosis not present

## 2020-05-29 MED ORDER — OXYCODONE HCL 5 MG PO TABS
5.0000 mg | ORAL_TABLET | Freq: Once | ORAL | Status: AC
  Administered 2020-05-29: 5 mg via ORAL

## 2020-05-29 MED ORDER — SODIUM CHLORIDE 0.9 % IV SOLN
628.8000 mg | Freq: Once | INTRAVENOUS | Status: AC
  Administered 2020-05-29: 630 mg via INTRAVENOUS
  Filled 2020-05-29: qty 63

## 2020-05-29 MED ORDER — FAMOTIDINE IN NACL 20-0.9 MG/50ML-% IV SOLN
20.0000 mg | Freq: Once | INTRAVENOUS | Status: AC
  Administered 2020-05-29: 20 mg via INTRAVENOUS

## 2020-05-29 MED ORDER — PALONOSETRON HCL INJECTION 0.25 MG/5ML
INTRAVENOUS | Status: AC
  Filled 2020-05-29: qty 5

## 2020-05-29 MED ORDER — PALONOSETRON HCL INJECTION 0.25 MG/5ML
0.2500 mg | Freq: Once | INTRAVENOUS | Status: AC
  Administered 2020-05-29: 0.25 mg via INTRAVENOUS

## 2020-05-29 MED ORDER — DIPHENHYDRAMINE HCL 50 MG/ML IJ SOLN
50.0000 mg | Freq: Once | INTRAMUSCULAR | Status: AC
  Administered 2020-05-29: 50 mg via INTRAVENOUS

## 2020-05-29 MED ORDER — ACETAMINOPHEN 325 MG PO TABS
ORAL_TABLET | ORAL | Status: AC
  Filled 2020-05-29: qty 2

## 2020-05-29 MED ORDER — SODIUM CHLORIDE 0.9% FLUSH
10.0000 mL | INTRAVENOUS | Status: DC | PRN
  Administered 2020-05-29: 10 mL
  Filled 2020-05-29: qty 10

## 2020-05-29 MED ORDER — ACETAMINOPHEN 325 MG PO TABS
325.0000 mg | ORAL_TABLET | Freq: Once | ORAL | Status: AC
  Administered 2020-05-29: 325 mg via ORAL

## 2020-05-29 MED ORDER — SODIUM CHLORIDE 0.9 % IV SOLN
150.0000 mg | Freq: Once | INTRAVENOUS | Status: AC
  Administered 2020-05-29: 150 mg via INTRAVENOUS
  Filled 2020-05-29: qty 150

## 2020-05-29 MED ORDER — SODIUM CHLORIDE 0.9 % IV SOLN
175.0000 mg/m2 | Freq: Once | INTRAVENOUS | Status: AC
  Administered 2020-05-29: 324 mg via INTRAVENOUS
  Filled 2020-05-29: qty 54

## 2020-05-29 MED ORDER — HEPARIN SOD (PORK) LOCK FLUSH 100 UNIT/ML IV SOLN
500.0000 [IU] | Freq: Once | INTRAVENOUS | Status: AC | PRN
  Administered 2020-05-29: 500 [IU]
  Filled 2020-05-29: qty 5

## 2020-05-29 MED ORDER — OXYCODONE HCL 5 MG PO TABS
ORAL_TABLET | ORAL | Status: AC
  Filled 2020-05-29: qty 2

## 2020-05-29 MED ORDER — SODIUM CHLORIDE 0.9 % IV SOLN
10.0000 mg | Freq: Once | INTRAVENOUS | Status: AC
  Administered 2020-05-29: 10 mg via INTRAVENOUS
  Filled 2020-05-29: qty 10

## 2020-05-29 MED ORDER — DIPHENHYDRAMINE HCL 50 MG/ML IJ SOLN
INTRAMUSCULAR | Status: AC
  Filled 2020-05-29: qty 1

## 2020-05-29 MED ORDER — FAMOTIDINE IN NACL 20-0.9 MG/50ML-% IV SOLN
INTRAVENOUS | Status: AC
  Filled 2020-05-29: qty 50

## 2020-05-29 MED ORDER — SODIUM CHLORIDE 0.9 % IV SOLN
Freq: Once | INTRAVENOUS | Status: AC
  Filled 2020-05-29: qty 250

## 2020-05-29 NOTE — Progress Notes (Signed)
Gynecologic Oncology Multi-Disciplinary Disposition Conference Note  Date of the Conference: 05/29/2020  Patient Name: [name]  Referring Provider: Dr. Harrington Challenger Primary GYN Oncologist: Dr. Denman George  Stage/Disposition:  Recurrent grade 1 endometrioid endometrial adenocarcinoma. Disposition is to chemotherapy with carboplatin/Taxol for 6 cycles with repeat imaging after 3 cycles. Consideration for possible maintenance therapy after chemotherapy.   This Multidisciplinary conference took place involving physicians from [facility], [place], Radiation Oncology, Pathology, Radiology along with the Gynecologic Oncology Nurse Practitioner and RN.  Comprehensive assessment of the patient's malignancy, staging, need for surgery, chemotherapy, radiation therapy, and need for further testing were reviewed. Supportive measures, both inpatient and following discharge were also discussed. The recommended plan of care is documented. Greater than 35 minutes were spent correlating and coordinating this patient's care.

## 2020-05-29 NOTE — Progress Notes (Signed)
Per Sandi Mealy, PA ok to give 2 percocet tablets for pain.

## 2020-05-29 NOTE — Patient Instructions (Signed)
[facility] Discharge Instructions for Patients Receiving Chemotherapy  Today you received the following chemotherapy agents Taxol and Carboplatin  To help prevent nausea and vomiting after your treatment, we encourage you to take your nausea medication as directed. No Zofran for the next 3 days. Take Compazine instead.   Okay to take Colace (over the counter) for constipation with Miralax    If you develop nausea and vomiting that is not controlled by your nausea medication, call the clinic.   BELOW ARE SYMPTOMS THAT SHOULD BE REPORTED IMMEDIATELY:  *FEVER GREATER THAN 100.5 F  *CHILLS WITH OR WITHOUT FEVER  NAUSEA AND VOMITING THAT IS NOT CONTROLLED WITH YOUR NAUSEA MEDICATION  *UNUSUAL SHORTNESS OF BREATH  *UNUSUAL BRUISING OR BLEEDING  TENDERNESS IN MOUTH AND THROAT WITH OR WITHOUT PRESENCE OF ULCERS  *URINARY PROBLEMS  *BOWEL PROBLEMS  UNUSUAL RASH Items with * indicate a potential emergency and should be followed up as soon as possible.  Feel free to call the clinic should you have any questions or concerns. The clinic phone number is [phone].  Please show the [facility] at check-in to the Emergency Department and triage nurse.  Paclitaxel injection What is this medicine? PACLITAXEL (PAK li TAX el) is a chemotherapy drug. It targets fast dividing cells, like cancer cells, and causes these cells to die. This medicine is used to treat ovarian cancer, breast cancer, lung cancer, Kaposi's sarcoma, and other cancers. This medicine may be used for other purposes; ask your health care provider or pharmacist if you have questions. COMMON BRAND NAME(S): Onxol, Taxol What should I tell my health care provider before I take this medicine? They need to know if you have any of these conditions:  history of irregular heartbeat  liver disease  low blood counts, like low white cell, platelet, or red cell counts  lung or breathing disease, like  asthma  tingling of the fingers or toes, or other nerve disorder  an unusual or allergic reaction to paclitaxel, alcohol, polyoxyethylated castor oil, other chemotherapy, other medicines, foods, dyes, or preservatives  pregnant or trying to get pregnant  breast-feeding How should I use this medicine? This drug is given as an infusion into a vein. It is administered in a hospital or clinic by a specially trained health care professional. Talk to your pediatrician regarding the use of this medicine in children. Special care may be needed. Overdosage: If you think you have taken too much of this medicine contact a poison control center or emergency room at once. NOTE: This medicine is only for you. Do not share this medicine with others. What if I miss a dose? It is important not to miss your dose. Call your doctor or health care professional if you are unable to keep an appointment. What may interact with this medicine? Do not take this medicine with any of the following medications:  disulfiram  metronidazole This medicine may also interact with the following medications:  antiviral medicines for hepatitis, HIV or AIDS  certain antibiotics like erythromycin and clarithromycin  certain medicines for fungal infections like ketoconazole and itraconazole  certain medicines for seizures like carbamazepine, phenobarbital, phenytoin  gemfibrozil  nefazodone  rifampin  St. John's wort This list may not describe all possible interactions. Give your health care provider a list of all the medicines, herbs, non-prescription drugs, or dietary supplements you use. Also tell them if you smoke, drink alcohol, or use illegal drugs. Some items may interact with your medicine. What  should I watch for while using this medicine? Your condition will be monitored carefully while you are receiving this medicine. You will need important blood work done while you are taking this medicine. This  medicine can cause serious allergic reactions. To reduce your risk you will need to take other medicine(s) before treatment with this medicine. If you experience allergic reactions like skin rash, itching or hives, swelling of the face, lips, or tongue, tell your doctor or health care professional right away. In some cases, you may be given additional medicines to help with side effects. Follow all directions for their use. This drug may make you feel generally unwell. This is not uncommon, as chemotherapy can affect healthy cells as well as cancer cells. Report any side effects. Continue your course of treatment even though you feel ill unless your doctor tells you to stop. Call your doctor or health care professional for advice if you get a fever, chills or sore throat, or other symptoms of a cold or flu. Do not treat yourself. This drug decreases your body's ability to fight infections. Try to avoid being around people who are sick. This medicine may increase your risk to bruise or bleed. Call your doctor or health care professional if you notice any unusual bleeding. Be careful brushing and flossing your teeth or using a toothpick because you may get an infection or bleed more easily. If you have any dental work done, tell your dentist you are receiving this medicine. Avoid taking products that contain aspirin, acetaminophen, ibuprofen, naproxen, or ketoprofen unless instructed by your doctor. These medicines may hide a fever. Do not become pregnant while taking this medicine. Women should inform their doctor if they wish to become pregnant or think they might be pregnant. There is a potential for serious side effects to an unborn child. Talk to your health care professional or pharmacist for more information. Do not breast-feed an infant while taking this medicine. Men are advised not to father a child while receiving this medicine. This product may contain alcohol. Ask your pharmacist or healthcare  provider if this medicine contains alcohol. Be sure to tell all healthcare providers you are taking this medicine. Certain medicines, like metronidazole and disulfiram, can cause an unpleasant reaction when taken with alcohol. The reaction includes flushing, headache, nausea, vomiting, sweating, and increased thirst. The reaction can last from 30 minutes to several hours. What side effects may I notice from receiving this medicine? Side effects that you should report to your doctor or health care professional as soon as possible:  allergic reactions like skin rash, itching or hives, swelling of the face, lips, or tongue  breathing problems  changes in vision  fast, irregular heartbeat  high or low blood pressure  mouth sores  pain, tingling, numbness in the hands or feet  signs of decreased platelets or bleeding - bruising, pinpoint red spots on the skin, black, tarry stools, blood in the urine  signs of decreased red blood cells - unusually weak or tired, feeling faint or lightheaded, falls  signs of infection - fever or chills, cough, sore throat, pain or difficulty passing urine  signs and symptoms of liver injury like dark yellow or brown urine; general ill feeling or flu-like symptoms; light-colored stools; loss of appetite; nausea; right upper belly pain; unusually weak or tired; yellowing of the eyes or skin  swelling of the ankles, feet, hands  unusually slow heartbeat Side effects that usually do not require medical attention (report to your  doctor or health care professional if they continue or are bothersome):  diarrhea  hair loss  loss of appetite  muscle or joint pain  nausea, vomiting  pain, redness, or irritation at site where injected  tiredness This list may not describe all possible side effects. Call your doctor for medical advice about side effects. You may report side effects to FDA at 1-800-FDA-1088. Where should I keep my medicine? This drug is  given in a hospital or clinic and will not be stored at home. NOTE: This sheet is a summary. It may not cover all possible information. If you have questions about this medicine, talk to your doctor, pharmacist, or health care provider.  2020 Elsevier/Gold Standard (2017-08-05 13:14:55)  Carboplatin injection What is this medicine? CARBOPLATIN (KAR boe pla tin) is a chemotherapy drug. It targets fast dividing cells, like cancer cells, and causes these cells to die. This medicine is used to treat ovarian cancer and many other cancers. This medicine may be used for other purposes; ask your health care provider or pharmacist if you have questions. COMMON BRAND NAME(S): Paraplatin What should I tell my health care provider before I take this medicine? They need to know if you have any of these conditions:  blood disorders  hearing problems  kidney disease  recent or ongoing radiation therapy  an unusual or allergic reaction to carboplatin, cisplatin, other chemotherapy, other medicines, foods, dyes, or preservatives  pregnant or trying to get pregnant  breast-feeding How should I use this medicine? This drug is usually given as an infusion into a vein. It is administered in a hospital or clinic by a specially trained health care professional. Talk to your pediatrician regarding the use of this medicine in children. Special care may be needed. Overdosage: If you think you have taken too much of this medicine contact a poison control center or emergency room at once. NOTE: This medicine is only for you. Do not share this medicine with others. What if I miss a dose? It is important not to miss a dose. Call your doctor or health care professional if you are unable to keep an appointment. What may interact with this medicine?  medicines for seizures  medicines to increase blood counts like filgrastim, pegfilgrastim, sargramostim  some antibiotics like amikacin, gentamicin, neomycin,  streptomycin, tobramycin  vaccines Talk to your doctor or health care professional before taking any of these medicines:  acetaminophen  aspirin  ibuprofen  ketoprofen  naproxen This list may not describe all possible interactions. Give your health care provider a list of all the medicines, herbs, non-prescription drugs, or dietary supplements you use. Also tell them if you smoke, drink alcohol, or use illegal drugs. Some items may interact with your medicine. What should I watch for while using this medicine? Your condition will be monitored carefully while you are receiving this medicine. You will need important blood work done while you are taking this medicine. This drug may make you feel generally unwell. This is not uncommon, as chemotherapy can affect healthy cells as well as cancer cells. Report any side effects. Continue your course of treatment even though you feel ill unless your doctor tells you to stop. In some cases, you may be given additional medicines to help with side effects. Follow all directions for their use. Call your doctor or health care professional for advice if you get a fever, chills or sore throat, or other symptoms of a cold or flu. Do not treat yourself. This drug  decreases your body's ability to fight infections. Try to avoid being around people who are sick. This medicine may increase your risk to bruise or bleed. Call your doctor or health care professional if you notice any unusual bleeding. Be careful brushing and flossing your teeth or using a toothpick because you may get an infection or bleed more easily. If you have any dental work done, tell your dentist you are receiving this medicine. Avoid taking products that contain aspirin, acetaminophen, ibuprofen, naproxen, or ketoprofen unless instructed by your doctor. These medicines may hide a fever. Do not become pregnant while taking this medicine. Women should inform their doctor if they wish to become  pregnant or think they might be pregnant. There is a potential for serious side effects to an unborn child. Talk to your health care professional or pharmacist for more information. Do not breast-feed an infant while taking this medicine. What side effects may I notice from receiving this medicine? Side effects that you should report to your doctor or health care professional as soon as possible:  allergic reactions like skin rash, itching or hives, swelling of the face, lips, or tongue  signs of infection - fever or chills, cough, sore throat, pain or difficulty passing urine  signs of decreased platelets or bleeding - bruising, pinpoint red spots on the skin, black, tarry stools, nosebleeds  signs of decreased red blood cells - unusually weak or tired, fainting spells, lightheadedness  breathing problems  changes in hearing  changes in vision  chest pain  high blood pressure  low blood counts - This drug may decrease the number of white blood cells, red blood cells and platelets. You may be at increased risk for infections and bleeding.  nausea and vomiting  pain, swelling, redness or irritation at the injection site  pain, tingling, numbness in the hands or feet  problems with balance, talking, walking  trouble passing urine or change in the amount of urine Side effects that usually do not require medical attention (report to your doctor or health care professional if they continue or are bothersome):  hair loss  loss of appetite  metallic taste in the mouth or changes in taste This list may not describe all possible side effects. Call your doctor for medical advice about side effects. You may report side effects to FDA at 1-800-FDA-1088. Where should I keep my medicine? This drug is given in a hospital or clinic and will not be stored at home. NOTE: This sheet is a summary. It may not cover all possible information. If you have questions about this medicine, talk to  your doctor, pharmacist, or health care provider.  2020 Elsevier/Gold Standard (2008-03-08 14:38:05)

## 2020-05-29 NOTE — Progress Notes (Signed)
Ok to treat with BP today per MD Alvy Bimler. Pt verbalizes understanding to take home supply of BP medication prior to coming to [facility] for her next appt.

## 2020-05-31 ENCOUNTER — Telehealth: Payer: Self-pay | Admitting: Emergency Medicine

## 2020-05-31 NOTE — Telephone Encounter (Signed)
Chemo f/u call, spoke with patient.  Pt denies any questions/concerns at this time or any side effects, aware to f/u as needed.

## 2020-05-31 NOTE — Telephone Encounter (Signed)
-----   Message from Wylene Men, RN sent at 05/29/2020  4:49 PM EDT ----- Regarding: [place] TAXOL/CARBO Patient received 1st time Taxol/Carbo. Tolerated well.  No c/o or s/s of distress or discomfort.

## 2020-06-01 ENCOUNTER — Encounter: Payer: Self-pay | Admitting: Hematology and Oncology

## 2020-06-01 ENCOUNTER — Other Ambulatory Visit: Payer: Self-pay

## 2020-06-01 ENCOUNTER — Telehealth: Payer: Self-pay | Admitting: Hematology and Oncology

## 2020-06-01 ENCOUNTER — Inpatient Hospital Stay: Payer: Medicare HMO | Admitting: Hematology and Oncology

## 2020-06-01 ENCOUNTER — Telehealth: Payer: Self-pay

## 2020-06-01 ENCOUNTER — Other Ambulatory Visit: Payer: Self-pay | Admitting: Hematology and Oncology

## 2020-06-01 DIAGNOSIS — Z7189 Other specified counseling: Secondary | ICD-10-CM

## 2020-06-01 DIAGNOSIS — R600 Localized edema: Secondary | ICD-10-CM

## 2020-06-01 DIAGNOSIS — N1330 Unspecified hydronephrosis: Secondary | ICD-10-CM | POA: Diagnosis not present

## 2020-06-01 DIAGNOSIS — G893 Neoplasm related pain (acute) (chronic): Secondary | ICD-10-CM

## 2020-06-01 DIAGNOSIS — Z5111 Encounter for antineoplastic chemotherapy: Secondary | ICD-10-CM | POA: Diagnosis not present

## 2020-06-01 DIAGNOSIS — D869 Sarcoidosis, unspecified: Secondary | ICD-10-CM | POA: Diagnosis not present

## 2020-06-01 DIAGNOSIS — Z90722 Acquired absence of ovaries, bilateral: Secondary | ICD-10-CM | POA: Diagnosis not present

## 2020-06-01 DIAGNOSIS — N131 Hydronephrosis with ureteral stricture, not elsewhere classified: Secondary | ICD-10-CM | POA: Diagnosis not present

## 2020-06-01 DIAGNOSIS — C541 Malignant neoplasm of endometrium: Secondary | ICD-10-CM | POA: Diagnosis not present

## 2020-06-01 DIAGNOSIS — Z90710 Acquired absence of both cervix and uterus: Secondary | ICD-10-CM | POA: Diagnosis not present

## 2020-06-01 DIAGNOSIS — R110 Nausea: Secondary | ICD-10-CM

## 2020-06-01 DIAGNOSIS — K5909 Other constipation: Secondary | ICD-10-CM | POA: Diagnosis not present

## 2020-06-01 NOTE — Progress Notes (Signed)
[facility] OFFICE PROGRESS NOTE  Patient Care Team: [name], [name], MD as PCP - General (Family Medicine)  ASSESSMENT & PLAN:  Endometrial cancer ([place]) Overall, she tolerated chemotherapy well She has less pain and regular bowel movement Clinically, she appears to be improving We will continue supportive care I will see her back prior to cycle 2 of treatment  Bilateral lower extremity edema On exam, her leg swelling has improved Observe for now  Cancer associated pain She has minimum pain She will continue morphine sulfate as needed We discussed narcotic refill policy  Hydronephrosis of left kidney She has no problem with urination She will continue to hydrate as much as possible  Nausea without vomiting This has resolved I reinforced the importance of taking antiemetics as needed  Other constipation Constipation has resolved She will continue laxatives if needed  Sarcoidosis ([place]) She will take low-dose prednisone when she is off chemotherapy  Goals of care, counseling/discussion We discussed goals of care She has stage IV disease which is generally incurable but is still highly treatable as she is young We discussed the role of palliative chemotherapy and the role of discussions about advance directives and living will   No orders of the defined types were placed in this encounter.   All questions were answered. The patient knows to call the clinic with any problems, questions or concerns. The total time spent in the appointment was 30 minutes encounter with patients including review of chart and various tests results, discussions about plan of care and coordination of care plan   [name], MD 06/01/2020 9:26 AM  INTERVAL HISTORY: Please see below for problem oriented charting. She returns for further follow-up She tolerated chemotherapy well She had very mild nausea Constipation has resolved She has minimum pain Leg swelling has  improved She is eating well and urinating well No recent chest pain or shortness of breath Denies peripheral neuropathy or bone pain  SUMMARY OF ONCOLOGIC HISTORY: Oncology History Overview Note  ER/PR neg, MSI stable   Endometrial cancer ([place])  06/15/2018 Surgery   Surgeon: Donaciano Eva     Operation: Robotic-assisted laparoscopic total hysterectomy with bilateral salpingoophorectomy, SLN biopsy     Operative Findings:  : 6cm uterus, normal tubes and ovaries, omentum adherent to the anterior abdominal wall and uterine fundus. No suspicious nodes.   06/15/2018 Pathology Results   Procedure: Total hysterectomy with bilateral salpingo-oophorectomy. Bilateral obturator sentinel lymph node biopsies. Histologic type: Endometrioid adenocarcinoma. Histologic Grade: FIGO grade 1. Myometrial invasion: Depth of invasion: 15 mm Myometrial thickness: 20 mm Uterine Serosa Involvement: Not identified. Cervical stromal involvement: Not identified. Extent of involvement of other organs: Uninvolved. Lymphovascular invasion: Present. Regional Lymph Nodes: Examined: 2 Sentinel 0 Non-sentinel 2 Total Lymph nodes with metastasis: 0 Isolated tumor cells (< 0.2 mm): 2 Micrometastasis: (> 0.2 mm and < 2.0 mm): 0 Macrometastasis: (> 2.0 mm): 0 Extracapsular extension: N/A. Tumor block for ancillary studies: 4Q-S. MMR / MSI testing: Will be ordered. Pathologic Stage Classification (pTNM, AJCC 8th edition): pT1b, pN0(i+)   07/20/2018 Imaging   1. Surgical changes from recent hysterectomy. No findings suspicious for residual tumor or adenopathy in the pelvis. 2. No CT findings to suggest omental, peritoneal surface or solid organ metastatic disease in the abdomen. 3. Changes consistent with known sarcoidosis in the chest with bulky calcified mediastinal and hilar lymph nodes and interstitial lung disease. I do not see any definite CT findings to suggest metastatic disease involving the chest.  08/19/2018 - 09/17/2018 Radiation Therapy   08/19/18 - 09/17/18: Vaginal Cuff, 3 cm cylinder with treatment length of 3 cm / 30 Gy delivered in 5 fractions of 6 Gy, Brachytherapy / HDR, Iridium-192   05/2005: Left Chest Wall keloid   04/2004 - 05/2004: Anus, pelvis /inguinal ( Dr. Tammi Klippel)   01/19/2020 Imaging   1. No definite CT findings of the abdomen or pelvis to explain pain. 2. The sigmoid colon and rectum are decompressed although slightly thickened appearing, suggestive of nonspecific infectious, inflammatory, or ischemic colitis. Correlate for referable clinical symptoms. 3. There is trace, nonspecific free fluid in the low pelvis, which may be reactive, although would be difficult to distinguish from postoperative and post treatment soft tissue change in the pelvis given history of hysterectomy and rectal cancer. 4. Unchanged prominent retroperitoneal and iliac lymph nodes. No new findings suspicious for metastatic disease in the abdomen or pelvis. 5. Bibasilar pulmonary fibrosis and mediastinal and hilar lymphadenopathy, better evaluated by prior CT chest and consistent with patient diagnosis of sarcoidosis.       04/18/2020 Imaging   Venous Doppler Left:  - No evidence of deep vein thrombosis seen in the left lower extremity, from the common femoral through the popliteal veins.  - No evidence of superficial venous thrombosis in the left lower extremity.  - There is no evidence of venous reflux seen in the left lower extremity.  - Rouleax flow noted in the superficial system.    05/18/2020 PET scan   1. Complex scan with a combination of hypermetabolic granulomatous disease (sarcoidosis) as well as hypermetabolic metastatic endometrial carcinoma. 2. Hypermetabolic mediastinal hilar lymph nodes and pulmonary nodularity with bronchovascular thickening is consistent with sarcoidosis. 3. Solitary hypermetabolic nodule in the medial LEFT lower lobe, new from prior with intense metabolic activity,  is concerning for a metastatic deposit to the LEFT lower lobe. 4. Ill-defined hypermetabolic soft tissue along the LEFT operator space is most consistent with local recurrence of endometrial carcinoma. There is entrapment of the LEFT ureter by this hypermetabolic soft tissue with mild hydroureter and hydronephrosis on the LEFT. 5. Additionally there appears to be direct extension from the hypermetabolic left operator space soft tissue to the sigmoid colon with there is a well-defined hypermetabolic lesion in the colon. 6. More distant hypermetabolic peritoneal metastasis along the ventral peritoneal surface of the RIGHT abdomen beneath RIGHT rectus abdominus muscle. 7. Hypermetabolic lymph nodes in the porta hepatis are favored related to sarcoidosis.   05/19/2020 Cancer Staging   Staging form: Corpus Uteri - Carcinoma and Carcinosarcoma, AJCC 8th Edition - Clinical stage from 05/19/2020: Stage IVB (rcT1, cN2a, cM1) - Signed by [name], MD on 05/19/2020   05/26/2020 Procedure   Successful placement of a right internal jugular approach power injectable Port-A-Cath. The catheter is ready for immediate use   05/29/2020 -  Chemotherapy   The patient had carboplatin and taxol for chemotherapy treatment.     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       REVIEW OF SYSTEMS:   Constitutional: Denies fevers, chills or  abnormal weight loss Eyes: Denies blurriness of vision Ears, nose, mouth, throat, and face: Denies mucositis or sore throat Respiratory: Denies  cough, dyspnea or wheezes Cardiovascular: Denies palpitation, chest discomfort Skin: Denies abnormal skin rashes Lymphatics: Denies new lymphadenopathy or easy bruising Neurological:Denies numbness, tingling or new weaknesses Behavioral/Psych: Mood is stable, no new changes  All other systems were reviewed with the patient and are negative.  I have reviewed the past medical history, past surgical history, social history and family history with the patient and they are unchanged from previous note.  ALLERGIES:  is allergic to shellfish allergy and penicillins.  MEDICATIONS:  Current Outpatient Medications  Medication Sig Dispense Refill   albuterol (VENTOLIN HFA) 108 (90 Base) MCG/ACT inhaler Inhale 2 puffs into the lungs every 4 (four) hours as needed for wheezing or shortness of breath. 25.5 g 1   bisoprolol (ZEBETA) 10 MG tablet TAKE 1 TABLET TWICE DAILY 180 tablet 1   budesonide-formoterol (SYMBICORT) 80-4.5 MCG/ACT inhaler Inhale 2 puffs into the lungs 2 (two) times daily. 3 Inhaler 3   desonide (DESOWEN) 0.05 % lotion Apply topically 2 (two) times daily. Continue following with dermatologist 59 mL 0   dexamethasone (DECADRON) 4 MG tablet Take 2 tabs at the night before and 2 tabs the morning of chemotherapy, every 3 weeks, by mouth 36 tablet 6   dextromethorphan (DELSYM) 30 MG/5ML liquid Take by mouth as needed for cough.      hydrochlorothiazide (HYDRODIURIL) 25 MG tablet TAKE 1 TABLET EVERY DAY 90 tablet 3   hydrocortisone 2.5 % cream Apply 1 application topically 2 (two) times daily.     lidocaine-prilocaine (EMLA) cream Apply to affected area once 30 g 3   linaclotide (LINZESS) 145 MCG CAPS capsule Take 1 capsule (145 mcg total) by mouth daily before breakfast. 30 capsule 1   loratadine (CLARITIN) 10 MG tablet Take 10 mg by mouth daily as needed for allergies.     morphine (MSIR) 15 MG tablet Take 1 tablet (15 mg total) by mouth every 4 (four) hours as needed for severe  pain. 30 tablet 0   ondansetron (ZOFRAN) 8 MG tablet Take 1 tablet (8 mg total) by mouth every 8 (eight) hours as needed for nausea. 30 tablet 3   predniSONE (DELTASONE) 10 MG tablet Take 0.5 tablets (5 mg total) by mouth daily with breakfast. 90 tablet 3   prochlorperazine (COMPAZINE) 10 MG tablet Take 1 tablet (10 mg total) by mouth every 6 (six) hours as needed for nausea or vomiting. 30 tablet 3   RABEprazole (ACIPHEX) 20 MG tablet Take 1 tablet (20 mg total) by mouth daily before breakfast. 90 tablet 3   simvastatin (ZOCOR) 20 MG tablet Take 1 tablet (20 mg total) by mouth at bedtime. 90 tablet 3   vitamin C (ASCORBIC ACID) 500 MG tablet Take 500 mg by mouth daily.     No current facility-administered medications for this visit.    PHYSICAL EXAMINATION: ECOG PERFORMANCE STATUS: 1 - Symptomatic but completely ambulatory  Vitals:   06/01/20 0847  BP: (!) 108/93  Pulse: 76  Resp: 18  Temp: 98.5 F (36.9 C)  SpO2: 98%   Filed Weights   06/01/20 0847  Weight: 215 lb 3.2 oz (97.6 kg)    GENERAL:alert, no distress and comfortable SKIN: skin color, texture, turgor are normal, no rashes or significant lesions EYES: normal, Conjunctiva are pink and non-injected, sclera clear OROPHARYNX:no exudate, no erythema and lips, buccal mucosa, and tongue normal  NECK: supple,  thyroid normal size, non-tender, without nodularity LYMPH:  no palpable lymphadenopathy in the cervical, axillary or inguinal LUNGS: clear to auscultation and percussion with normal breathing effort HEART: regular rate & rhythm and no murmurs with mild bilateral lower extremity edema ABDOMEN:abdomen soft, non-tender and normal bowel sounds Musculoskeletal:no cyanosis of digits and no clubbing  NEURO: alert & oriented x 3 with fluent speech, no focal motor/sensory deficits  LABORATORY DATA:  I have reviewed the data as listed    Component Value Date/Time   NA 139 05/26/2020 0850   K 4.0 05/26/2020 0850   CL  100 05/26/2020 0850   CO2 30 05/26/2020 0850   GLUCOSE 131 (H) 05/26/2020 0850   BUN 15 05/26/2020 0850   CREATININE 1.14 (H) 05/26/2020 0850   CREATININE 0.86 08/09/2019 1558   CALCIUM 9.2 05/26/2020 0850   PROT 8.4 (H) 05/26/2020 0850   ALBUMIN 4.0 05/26/2020 0850   AST 21 05/26/2020 0850   AST 25 08/09/2019 1558   ALT 24 05/26/2020 0850   ALT 28 08/09/2019 1558   ALKPHOS 128 (H) 05/26/2020 0850   BILITOT 0.9 05/26/2020 0850   BILITOT 0.5 08/09/2019 1558   GFRNONAA 52 (L) 05/26/2020 0850   GFRNONAA >60 08/09/2019 1558   GFRAA >60 05/26/2020 0850   GFRAA >60 08/09/2019 1558    No results found for: SPEP, UPEP  Lab Results  Component Value Date   WBC 7.5 05/26/2020   NEUTROABS 5.5 05/26/2020   HGB 10.9 (L) 05/26/2020   HCT 31.6 (L) 05/26/2020   MCV 93.8 05/26/2020   PLT 218 05/26/2020      Chemistry      Component Value Date/Time   NA 139 05/26/2020 0850   K 4.0 05/26/2020 0850   CL 100 05/26/2020 0850   CO2 30 05/26/2020 0850   BUN 15 05/26/2020 0850   CREATININE 1.14 (H) 05/26/2020 0850   CREATININE 0.86 08/09/2019 1558      Component Value Date/Time   CALCIUM 9.2 05/26/2020 0850   ALKPHOS 128 (H) 05/26/2020 0850   AST 21 05/26/2020 0850   AST 25 08/09/2019 1558   ALT 24 05/26/2020 0850   ALT 28 08/09/2019 1558   BILITOT 0.9 05/26/2020 0850   BILITOT 0.5 08/09/2019 1558       RADIOGRAPHIC STUDIES: I have personally reviewed the radiological images as listed and agreed with the findings in the report. DG Tibia/Fibula Left  Result Date: 05/10/2020 CLINICAL DATA:  Left lower leg pain for 3 months. EXAM: LEFT TIBIA AND FIBULA - 2 VIEW COMPARISON:  Left foot radiographs 12/25/2012 FINDINGS: No fracture or destructive osseous lesion is identified. The knee and ankle are located. Small posterior and large plantar calcaneal enthesophytes are again seen with the latter mildly enlarging since 2014. There is mild nonspecific subcutaneous fat reticulation in the  lower leg. IMPRESSION: No acute osseous abnormality identified. Electronically Signed   By: Logan Bores M.D.   On: 05/10/2020 15:03   CT Chest W Contrast  Result Date: 05/18/2020 CLINICAL DATA:  Vaginal mass in lower extremity edema. History of endometrial cancer, suspected recurrence. Also with history of sarcoidosis. EXAM: CT CHEST, ABDOMEN, AND PELVIS WITH CONTRAST TECHNIQUE: Multidetector CT imaging of the chest, abdomen and pelvis was performed following the standard protocol during bolus administration of intravenous contrast. CONTRAST:  158m OMNIPAQUE IOHEXOL 300 MG/ML  SOLN COMPARISON:  07/20/2018 and PET-CT 05/18/2020 FINDINGS: CT CHEST FINDINGS Cardiovascular: Calcified and noncalcified plaque throughout the thoracic aorta. Heart size enlarged but stable. Particularly  with LEFT atrial enlargement. Calcified coronary artery disease. Main pulmonary artery 3.2 cm. Pulmonary arterial evaluation otherwise unremarkable on venous phase imaging. Mediastinum/Nodes: No axillary lymphadenopathy. Bulky mediastinal lymph nodes displaying calcification in the setting of known sarcoidosis. Also with hilar nodal enlargement displaying calcification. Lungs/Pleura: New pulmonary nodule in the RIGHT mid chest in the RIGHT upper lobe (image 59, series 4) 4 mm. Signs of fibrotic lung disease perhaps with some worsening since the previous study which was limited by respiratory motion. There were basilar nodules noted previously though there is a nodule at the medial LEFT lung base that measures approximately 1.4 x 1.4 cm that displays increased FDG uptake and there are other small nodules at the RIGHT lung base that were not definitively seen on the prior study, for instance a small nodule on image 92 of series 4 measures 6 mm and a similar size nodule on image 86 of series 4 also in the RIGHT lower lobe. At least 3 new nodules in the RIGHT lower lobe since previous imaging. New nodule in the LEFT chest on image 80 of  series 4 in the LEFT lower lobe at 8 mm. Airways are patent. Musculoskeletal: No chest wall mass. See below for full musculoskeletal details. CT ABDOMEN PELVIS FINDINGS Hepatobiliary: Lobular hepatic contours. No focal suspicious hepatic lesion. Portal vein is patent. Post cholecystectomy without biliary ductal dilation. Pancreas: Pancreas is normal without focal lesion or ductal dilation. No pancreatic inflammation. Spleen: Spleen normal size without focal lesion. Adrenals/Urinary Tract: Adrenal glands are normal. Smooth renal contours but with delayed enhancement of the LEFT kidney as compared to the RIGHT displaying mild to moderate LEFT hydroureteronephrosis. Obstruction of the LEFT ureter secondary to LEFT pelvic sidewall and vaginal apex mass measuring 3.6 by 4.2 cm (image 98, series 2) Adjacent colonic thickening of the sigmoid colon likely reflects involvement of the colon as well. Stomach/Bowel: Direct colonic involvement of the sigmoid colon as described. The appendix is normal. No sign of small bowel obstruction. Vascular/Lymphatic: Calcified and noncalcified atheromatous plaque of the abdominal aorta without sign of aneurysm. No adenopathy by size criteria in the retroperitoneum. Peri hepatic lymph node with mild enlargement may show some subtle calcification, potentially related to the patient's sarcoidosis. LEFT pelvic sidewall mass invades the obturator muscle and encases vascular structures and LEFT ureter. Mildly enlarged LEFT inguinal lymph nodes. No gross FDG uptake on previous PET scan. Reproductive: Post hysterectomy with pelvic mass as described. The nodularity of the pericolonic and perirectal fat. Fascial thickening in the pelvis and thickening of mesenteric reflections. Ill-defined nodule in the anterior peritoneum of the lower abdomen/pelvis (image 89, series 2) indicative of peritoneal metastasis. Other: As above with peritoneal disease. Also with body wall involvement of the RIGHT rectus  muscle as seen on the PET scan (image 78, series 2) Musculoskeletal: No destructive bone process. No acute bone finding. Spinal degenerative changes. IMPRESSION: 1. Mass of the LEFT vaginal apex extending to the LEFT pelvic sidewall, invading the LEFT obturator muscle, extending and involving the colon as well as obstructing the distal LEFT ureter. 2. LEFT ureteral obstruction with mild to moderate hydroureteronephrosis and delayed enhancement. 3. Signs of peritoneal and body wall involvement. 4. Signs of pulmonary metastatic disease particularly the dominant LEFT lower lobe nodule. There are other nodules that are new, this is the most suspicious of pulmonary nodules in the chest. 5. Signs of fibrotic lung disease perhaps with some worsening since the previous study which was limited by respiratory motion. 6. Signs of sarcoidosis  in addition to fibrotic lung disease with enlarged and calcified lymph nodes in the chest with similar appearance. 7. Aortic atherosclerosis. These results will be called to the ordering clinician or representative by the Radiologist Assistant, and communication documented in the PACS or [employer]. Aortic Atherosclerosis (ICD10-I70.0). Electronically Signed   By: Zetta Bills M.D.   On: 05/18/2020 17:52   CT Abdomen Pelvis W Contrast  Result Date: 05/18/2020 CLINICAL DATA:  Vaginal mass in lower extremity edema. History of endometrial cancer, suspected recurrence. Also with history of sarcoidosis. EXAM: CT CHEST, ABDOMEN, AND PELVIS WITH CONTRAST TECHNIQUE: Multidetector CT imaging of the chest, abdomen and pelvis was performed following the standard protocol during bolus administration of intravenous contrast. CONTRAST:  171m OMNIPAQUE IOHEXOL 300 MG/ML  SOLN COMPARISON:  07/20/2018 and PET-CT 05/18/2020 FINDINGS: CT CHEST FINDINGS Cardiovascular: Calcified and noncalcified plaque throughout the thoracic aorta. Heart size enlarged but stable. Particularly with LEFT atrial  enlargement. Calcified coronary artery disease. Main pulmonary artery 3.2 cm. Pulmonary arterial evaluation otherwise unremarkable on venous phase imaging. Mediastinum/Nodes: No axillary lymphadenopathy. Bulky mediastinal lymph nodes displaying calcification in the setting of known sarcoidosis. Also with hilar nodal enlargement displaying calcification. Lungs/Pleura: New pulmonary nodule in the RIGHT mid chest in the RIGHT upper lobe (image 59, series 4) 4 mm. Signs of fibrotic lung disease perhaps with some worsening since the previous study which was limited by respiratory motion. There were basilar nodules noted previously though there is a nodule at the medial LEFT lung base that measures approximately 1.4 x 1.4 cm that displays increased FDG uptake and there are other small nodules at the RIGHT lung base that were not definitively seen on the prior study, for instance a small nodule on image 92 of series 4 measures 6 mm and a similar size nodule on image 86 of series 4 also in the RIGHT lower lobe. At least 3 new nodules in the RIGHT lower lobe since previous imaging. New nodule in the LEFT chest on image 80 of series 4 in the LEFT lower lobe at 8 mm. Airways are patent. Musculoskeletal: No chest wall mass. See below for full musculoskeletal details. CT ABDOMEN PELVIS FINDINGS Hepatobiliary: Lobular hepatic contours. No focal suspicious hepatic lesion. Portal vein is patent. Post cholecystectomy without biliary ductal dilation. Pancreas: Pancreas is normal without focal lesion or ductal dilation. No pancreatic inflammation. Spleen: Spleen normal size without focal lesion. Adrenals/Urinary Tract: Adrenal glands are normal. Smooth renal contours but with delayed enhancement of the LEFT kidney as compared to the RIGHT displaying mild to moderate LEFT hydroureteronephrosis. Obstruction of the LEFT ureter secondary to LEFT pelvic sidewall and vaginal apex mass measuring 3.6 by 4.2 cm (image 98, series 2) Adjacent  colonic thickening of the sigmoid colon likely reflects involvement of the colon as well. Stomach/Bowel: Direct colonic involvement of the sigmoid colon as described. The appendix is normal. No sign of small bowel obstruction. Vascular/Lymphatic: Calcified and noncalcified atheromatous plaque of the abdominal aorta without sign of aneurysm. No adenopathy by size criteria in the retroperitoneum. Peri hepatic lymph node with mild enlargement may show some subtle calcification, potentially related to the patient's sarcoidosis. LEFT pelvic sidewall mass invades the obturator muscle and encases vascular structures and LEFT ureter. Mildly enlarged LEFT inguinal lymph nodes. No gross FDG uptake on previous PET scan. Reproductive: Post hysterectomy with pelvic mass as described. The nodularity of the pericolonic and perirectal fat. Fascial thickening in the pelvis and thickening of mesenteric reflections. Ill-defined nodule in the anterior peritoneum  of the lower abdomen/pelvis (image 89, series 2) indicative of peritoneal metastasis. Other: As above with peritoneal disease. Also with body wall involvement of the RIGHT rectus muscle as seen on the PET scan (image 78, series 2) Musculoskeletal: No destructive bone process. No acute bone finding. Spinal degenerative changes. IMPRESSION: 1. Mass of the LEFT vaginal apex extending to the LEFT pelvic sidewall, invading the LEFT obturator muscle, extending and involving the colon as well as obstructing the distal LEFT ureter. 2. LEFT ureteral obstruction with mild to moderate hydroureteronephrosis and delayed enhancement. 3. Signs of peritoneal and body wall involvement. 4. Signs of pulmonary metastatic disease particularly the dominant LEFT lower lobe nodule. There are other nodules that are new, this is the most suspicious of pulmonary nodules in the chest. 5. Signs of fibrotic lung disease perhaps with some worsening since the previous study which was limited by respiratory  motion. 6. Signs of sarcoidosis in addition to fibrotic lung disease with enlarged and calcified lymph nodes in the chest with similar appearance. 7. Aortic atherosclerosis. These results will be called to the ordering clinician or representative by the Radiologist Assistant, and communication documented in the PACS or [employer]. Aortic Atherosclerosis (ICD10-I70.0). Electronically Signed   By: Zetta Bills M.D.   On: 05/18/2020 17:52   NM PET Image Initial (PI) Skull Base To Thigh  Result Date: 05/18/2020 CLINICAL DATA:  Subsequent treatment strategy for uterine carcinoma. Additional history of anal carcinoma EXAM: NUCLEAR MEDICINE PET SKULL BASE TO THIGH TECHNIQUE: 11.6 mCi F-18 FDG was injected intravenously. Full-ring PET imaging was performed from the skull base to thigh after the radiotracer. CT data was obtained and used for attenuation correction and anatomic localization. Fasting blood glucose: 128 mg/dl COMPARISON:  CT same day 05/18/2020, CT 01/19/2020 FINDINGS: Mediastinal blood pool activity: SUV max 3.1 Liver activity: SUV max NA NECK: No hypermetabolic lymph nodes in the neck. Incidental CT findings: none CHEST: There multiple bilateral symmetric hypermetabolic lymph nodes Example RIGHT lower paratracheal lymph node SUV max equal 8.1. These lymph nodes have a central calcifications. Lymph nodes are rather bulky. For example RIGHT lower paratracheal node measures 2 cm in diameter. Subcarinal node measuring 1.6 cm. Adenopathy extends into the hila. Review of the lung parenchyma demonstrates peribronchial thickening and multiple discrete pulmonary nodules at the lung bases. For the most part these lung nodules do not have associated metabolic activity. There is 1 nodule in the posteromedial LEFT lower lobe which is larger measuring 18 mm (image 73/4) and with intense metabolic activity SUV max equal 14.3. This is greater metabolic activity than the mediastinal lymph nodes. In comparison to CT  07/20/2018, there is similar calcified mediastinal adenopathy and peripheral nodularity with peribronchovascular thickening all consistent with pulmonary sarcoidosis. However, the hypermetabolic medial LEFT lobe nodule is not present in 2019. Incidental CT findings: none ABDOMEN/PELVIS: Hypermetabolic a periportal lymph nodes with subtle calcifications are favored relate to presumed sarcoidosis in the chest. Along the LEFT operator space and along the path of the LEFT ureter there is hypermetabolic soft tissue thickening (4.0 x 2.0 cm, image 208) with intense metabolic activity (SUV max equal 17.5). There is adjacent discrete thickening within the sigmoid colon measuring 3.7 by 2.7 cm on image 206 with SUV max equal 14.9. This is favored direct extension from the operator space ill-defined soft tissue mass. There is mild LEFT hydroureter and hydronephrosis on LEFT related to partial obstruction by the LEFT operator space hypermetabolic mass. There is a single focus of hypermetabolic activity  along the ventral surface of the RIGHT peritoneal space beneath the RIGHT rectus muscle with SUV max equal 7.2. There is soft tissue thickening at this level on image 176. Incidental CT findings: Post hysterectomy and oophorectomy. Postcholecystectomy SKELETON: No focal hypermetabolic activity to suggest skeletal metastasis. Incidental CT findings: none IMPRESSION: 1. Complex scan with a combination of hypermetabolic granulomatous disease (sarcoidosis) as well as hypermetabolic metastatic endometrial carcinoma. 2. Hypermetabolic mediastinal hilar lymph nodes and pulmonary nodularity with bronchovascular thickening is consistent with sarcoidosis. 3. Solitary hypermetabolic nodule in the medial LEFT lower lobe, new from prior with intense metabolic activity, is concerning for a metastatic deposit to the LEFT lower lobe. 4. Ill-defined hypermetabolic soft tissue along the LEFT operator space is most consistent with local recurrence  of endometrial carcinoma. There is entrapment of the LEFT ureter by this hypermetabolic soft tissue with mild hydroureter and hydronephrosis on the LEFT. 5. Additionally there appears to be direct extension from the hypermetabolic left operator space soft tissue to the sigmoid colon with there is a well-defined hypermetabolic lesion in the colon. 6. More distant hypermetabolic peritoneal metastasis along the ventral peritoneal surface of the RIGHT abdomen beneath RIGHT rectus abdominus muscle. 7. Hypermetabolic lymph nodes in the porta hepatis are favored related to sarcoidosis. These results will be called to the ordering clinician or representative by the Radiologist Assistant, and communication documented in the PACS or [employer]. Electronically Signed   By: Suzy Bouchard M.D.   On: 05/18/2020 15:34   IR IMAGING GUIDED PORT INSERTION  Result Date: 05/26/2020 INDICATION: History of endometrial cancer. In need of durable intravenous access for chemotherapy administration. EXAM: IMPLANTED PORT A CATH PLACEMENT WITH ULTRASOUND AND FLUOROSCOPIC GUIDANCE COMPARISON:  CT of the chest, abdomen and pelvis-05/18/2020 MEDICATIONS: Vancomycin 1 gm IV; The antibiotic was administered within an appropriate time interval prior to skin puncture. ANESTHESIA/SEDATION: Moderate (conscious) sedation was employed during this procedure. A total of Versed 4 mg and Fentanyl 100 mcg was administered intravenously. Moderate Sedation Time: 27 minutes. The patient's level of consciousness and vital signs were monitored continuously by radiology nursing throughout the procedure under my direct supervision. CONTRAST:  None FLUOROSCOPY TIME:  24 seconds (7 mGy) COMPLICATIONS: None immediate. PROCEDURE: The procedure, risks, benefits, and alternatives were explained to the patient. Questions regarding the procedure were encouraged and answered. The patient understands and consents to the procedure. The right neck and chest were  prepped with chlorhexidine in a sterile fashion, and a sterile drape was applied covering the operative field. Maximum barrier sterile technique with sterile gowns and gloves were used for the procedure. A timeout was performed prior to the initiation of the procedure. Local anesthesia was provided with 1% lidocaine with epinephrine. After creating a small venotomy incision, a micropuncture kit was utilized to access the internal jugular vein. Real-time ultrasound guidance was utilized for vascular access including the acquisition of a permanent ultrasound image documenting patency of the accessed vessel. The microwire was utilized to measure appropriate catheter length. A subcutaneous port pocket was then created along the upper chest wall utilizing a combination of sharp and blunt dissection. The pocket was irrigated with sterile saline. A single lumen "ISP" sized power injectable port was chosen for placement. The 8 Fr catheter was tunneled from the port pocket site to the venotomy incision. The port was placed in the pocket. The external catheter was trimmed to appropriate length. At the venotomy, an 8 Fr peel-away sheath was placed over a guidewire under fluoroscopic guidance. The catheter  was then placed through the sheath and the sheath was removed. Final catheter positioning was confirmed and documented with a fluoroscopic spot radiograph. The port was accessed with a Huber needle, aspirated and flushed with heparinized saline. The venotomy site was closed with an interrupted 4-0 Vicryl suture. The port pocket incision was closed with interrupted 2-0 Vicryl suture. The skin was opposed with a running subcuticular 4-0 Vicryl suture. Dermabond and Steri-strips were applied to both incisions. Dressings were applied. The patient tolerated the procedure well without immediate post procedural complication. FINDINGS: After catheter placement, the tip lies within the superior cavoatrial junction. The catheter  aspirates and flushes normally and is ready for immediate use. IMPRESSION: Successful placement of a right internal jugular approach power injectable Port-A-Cath. The catheter is ready for immediate use. Electronically Signed   By: Sandi Mariscal M.D.   On: 05/26/2020 13:46

## 2020-06-01 NOTE — Assessment & Plan Note (Signed)
Overall, she tolerated chemotherapy well She has less pain and regular bowel movement Clinically, she appears to be improving We will continue supportive care I will see her back prior to cycle 2 of treatment

## 2020-06-01 NOTE — Assessment & Plan Note (Signed)
She has minimum pain She will continue morphine sulfate as needed We discussed narcotic refill policy

## 2020-06-01 NOTE — Assessment & Plan Note (Signed)
This has resolved I reinforced the importance of taking antiemetics as needed

## 2020-06-01 NOTE — Assessment & Plan Note (Signed)
She has no problem with urination She will continue to hydrate as much as possible

## 2020-06-01 NOTE — Assessment & Plan Note (Signed)
Constipation has resolved She will continue laxatives if needed

## 2020-06-01 NOTE — Assessment & Plan Note (Signed)
On exam, her leg swelling has improved Observe for now

## 2020-06-01 NOTE — Telephone Encounter (Signed)
Attempted to call and give appts date/time. Voicemail full unable to leave a message.

## 2020-06-01 NOTE — Telephone Encounter (Signed)
Scheduled per 6/17 sch message. Called a few times and unable to reach pt. Voicemail is full. Messaged RN Hassan Rowan to give pt a call.

## 2020-06-01 NOTE — Assessment & Plan Note (Signed)
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

## 2020-06-01 NOTE — Assessment & Plan Note (Signed)
She will take low-dose prednisone when she is off chemotherapy

## 2020-06-02 NOTE — Telephone Encounter (Signed)
Called and left a message with next appt date and time.

## 2020-06-20 ENCOUNTER — Inpatient Hospital Stay: Payer: Medicare HMO

## 2020-06-20 ENCOUNTER — Telehealth: Payer: Self-pay | Admitting: Hematology and Oncology

## 2020-06-20 ENCOUNTER — Other Ambulatory Visit: Payer: Self-pay

## 2020-06-20 ENCOUNTER — Inpatient Hospital Stay: Payer: Medicare HMO | Attending: Gynecologic Oncology | Admitting: Hematology and Oncology

## 2020-06-20 ENCOUNTER — Encounter: Payer: Self-pay | Admitting: Hematology and Oncology

## 2020-06-20 DIAGNOSIS — D61818 Other pancytopenia: Secondary | ICD-10-CM | POA: Diagnosis not present

## 2020-06-20 DIAGNOSIS — Z95828 Presence of other vascular implants and grafts: Secondary | ICD-10-CM

## 2020-06-20 DIAGNOSIS — G893 Neoplasm related pain (acute) (chronic): Secondary | ICD-10-CM

## 2020-06-20 DIAGNOSIS — C775 Secondary and unspecified malignant neoplasm of intrapelvic lymph nodes: Secondary | ICD-10-CM | POA: Insufficient documentation

## 2020-06-20 DIAGNOSIS — R600 Localized edema: Secondary | ICD-10-CM | POA: Diagnosis not present

## 2020-06-20 DIAGNOSIS — C541 Malignant neoplasm of endometrium: Secondary | ICD-10-CM

## 2020-06-20 DIAGNOSIS — Z79899 Other long term (current) drug therapy: Secondary | ICD-10-CM | POA: Diagnosis not present

## 2020-06-20 DIAGNOSIS — Z5111 Encounter for antineoplastic chemotherapy: Secondary | ICD-10-CM | POA: Diagnosis not present

## 2020-06-20 DIAGNOSIS — C7802 Secondary malignant neoplasm of left lung: Secondary | ICD-10-CM

## 2020-06-20 LAB — CMP (CANCER CENTER ONLY)
ALT: 26 U/L (ref 0–44)
AST: 19 U/L (ref 15–41)
Albumin: 3.5 g/dL (ref 3.5–5.0)
Alkaline Phosphatase: 124 U/L (ref 38–126)
Anion gap: 10 (ref 5–15)
BUN: 12 mg/dL (ref 8–23)
CO2: 26 mmol/L (ref 22–32)
Calcium: 9.5 mg/dL (ref 8.9–10.3)
Chloride: 107 mmol/L (ref 98–111)
Creatinine: 0.85 mg/dL (ref 0.44–1.00)
GFR, Est AFR Am: >60 mL/min (ref >60)
GFR, Estimated: >60 mL/min (ref >60)
Glucose, Bld: 116 mg/dL — ABNORMAL HIGH (ref 70–99)
Potassium: 4.1 mmol/L (ref 3.5–5.1)
Sodium: 143 mmol/L (ref 135–145)
Total Bilirubin: 0.5 mg/dL (ref 0.3–1.2)
Total Protein: 7.8 g/dL (ref 6.5–8.1)

## 2020-06-20 LAB — CBC WITH DIFFERENTIAL (CANCER CENTER ONLY)
Abs Immature Granulocytes: 0.02 10*3/uL (ref 0.00–0.07)
Basophils Absolute: 0 10*3/uL (ref 0.0–0.1)
Basophils Relative: 0 %
Eosinophils Absolute: 0.1 10*3/uL (ref 0.0–0.5)
Eosinophils Relative: 1 %
HCT: 28.1 % — ABNORMAL LOW (ref 36.0–46.0)
Hemoglobin: 9.7 g/dL — ABNORMAL LOW (ref 12.0–15.0)
Immature Granulocytes: 0 %
Lymphocytes Relative: 18 %
Lymphs Abs: 1.2 10*3/uL (ref 0.7–4.0)
MCH: 32.3 pg (ref 26.0–34.0)
MCHC: 34.5 g/dL (ref 30.0–36.0)
MCV: 93.7 fL (ref 80.0–100.0)
Monocytes Absolute: 0.7 10*3/uL (ref 0.1–1.0)
Monocytes Relative: 11 %
Neutro Abs: 4.5 10*3/uL (ref 1.7–7.7)
Neutrophils Relative %: 70 %
Platelet Count: 146 10*3/uL — ABNORMAL LOW (ref 150–400)
RBC: 3 MIL/uL — ABNORMAL LOW (ref 3.87–5.11)
RDW: 17 % — ABNORMAL HIGH (ref 11.5–15.5)
WBC Count: 6.5 10*3/uL (ref 4.0–10.5)
nRBC: 0 % (ref 0.0–0.2)

## 2020-06-20 MED ORDER — MORPHINE SULFATE 15 MG PO TABS: 15.0000 mg | ORAL_TABLET | ORAL | 0 refills | Status: DC | PRN

## 2020-06-20 MED ORDER — SODIUM CHLORIDE 0.9% FLUSH
10.0000 mL | INTRAVENOUS | Status: DC | PRN
  Administered 2020-06-20: 10 mL via INTRAVENOUS
  Filled 2020-06-20: qty 10

## 2020-06-20 MED FILL — MORPHINE SULFATE 15 MG TABS: 15 | 5 days supply | Qty: 30 | Fill #0

## 2020-06-20 NOTE — Assessment & Plan Note (Signed)
Overall, she tolerated chemotherapy well She has minimal pain pain and regular bowel movement Clinically, she appears to be improving We will continue supportive care I plan to repeat CT imaging after cycle 3 of therapy

## 2020-06-20 NOTE — Progress Notes (Signed)
[facility] OFFICE PROGRESS NOTE  Patient Care Team: [name], [name], MD as PCP - General (Family Medicine)  ASSESSMENT & PLAN:  Endometrial cancer ([place]) Overall, she tolerated chemotherapy well She has minimal pain pain and regular bowel movement Clinically, she appears to be improving We will continue supportive care I plan to repeat CT imaging after cycle 3 of therapy  Pancytopenia, acquired ([place]) She has mild pancytopenia from treatment Observe closely for now  Cancer associated pain She has minimum pain I refill her prescription of pain medicine just in case she needs to take it  Bilateral lower extremity edema She has mild persistent bilateral lower extremity edema, stable Observe closely for now   No orders of the defined types were placed in this encounter.   All questions were answered. The patient knows to call the clinic with any problems, questions or concerns. The total time spent in the appointment was 20 minutes encounter with patients including review of chart and various tests results, discussions about plan of care and coordination of care plan   [name], MD 06/20/2020 9:09 AM  INTERVAL HISTORY: Please see below for problem oriented charting. She returns with family for further follow-up, to be seen prior to cycle 2 of treatment She feels better She has minimum pain She still have lower extremity edema, stable Her abdominal discomfort, constipation and nausea had resolved Overall, she tolerated treatment very well  SUMMARY OF ONCOLOGIC HISTORY: Oncology History Overview Note  ER/PR neg, MSI stable   Endometrial cancer ([place])  06/15/2018 Surgery   Surgeon: Donaciano Eva     Operation: Robotic-assisted laparoscopic total hysterectomy with bilateral salpingoophorectomy, SLN biopsy     Operative Findings:  : 6cm uterus, normal tubes and ovaries, omentum adherent to the anterior abdominal wall and uterine fundus. No suspicious  nodes.   06/15/2018 Pathology Results   Procedure: Total hysterectomy with bilateral salpingo-oophorectomy. Bilateral obturator sentinel lymph node biopsies. Histologic type: Endometrioid adenocarcinoma. Histologic Grade: FIGO grade 1. Myometrial invasion: Depth of invasion: 15 mm Myometrial thickness: 20 mm Uterine Serosa Involvement: Not identified. Cervical stromal involvement: Not identified. Extent of involvement of other organs: Uninvolved. Lymphovascular invasion: Present. Regional Lymph Nodes: Examined: 2 Sentinel 0 Non-sentinel 2 Total Lymph nodes with metastasis: 0 Isolated tumor cells (< 0.2 mm): 2 Micrometastasis: (> 0.2 mm and < 2.0 mm): 0 Macrometastasis: (> 2.0 mm): 0 Extracapsular extension: N/A. Tumor block for ancillary studies: 4Q-S. MMR / MSI testing: Will be ordered. Pathologic Stage Classification (pTNM, AJCC 8th edition): pT1b, pN0(i+)   07/20/2018 Imaging   1. Surgical changes from recent hysterectomy. No findings suspicious for residual tumor or adenopathy in the pelvis. 2. No CT findings to suggest omental, peritoneal surface or solid organ metastatic disease in the abdomen. 3. Changes consistent with known sarcoidosis in the chest with bulky calcified mediastinal and hilar lymph nodes and interstitial lung disease. I do not see any definite CT findings to suggest metastatic disease involving the chest.   08/19/2018 - 09/17/2018 Radiation Therapy   08/19/18 - 09/17/18: Vaginal Cuff, 3 cm cylinder with treatment length of 3 cm / 30 Gy delivered in 5 fractions of 6 Gy, Brachytherapy / HDR, Iridium-192   05/2005: Left Chest Wall keloid   04/2004 - 05/2004: Anus, pelvis /inguinal ( Dr. Tammi Klippel)   01/19/2020 Imaging   1. No definite CT findings of the abdomen or pelvis to explain pain. 2. The sigmoid colon and rectum are decompressed although slightly thickened appearing, suggestive of nonspecific  infectious, inflammatory, or ischemic colitis. Correlate for referable  clinical symptoms. 3. There is trace, nonspecific free fluid in the low pelvis, which may be reactive, although would be difficult to distinguish from postoperative and post treatment soft tissue change in the pelvis given history of hysterectomy and rectal cancer. 4. Unchanged prominent retroperitoneal and iliac lymph nodes. No new findings suspicious for metastatic disease in the abdomen or pelvis. 5. Bibasilar pulmonary fibrosis and mediastinal and hilar lymphadenopathy, better evaluated by prior CT chest and consistent with patient diagnosis of sarcoidosis.       04/18/2020 Imaging   Venous Doppler Left:  - No evidence of deep vein thrombosis seen in the left lower extremity, from the common femoral through the popliteal veins.  - No evidence of superficial venous thrombosis in the left lower extremity.  - There is no evidence of venous reflux seen in the left lower extremity.  - Rouleax flow noted in the superficial system.    05/18/2020 PET scan   1. Complex scan with a combination of hypermetabolic granulomatous disease (sarcoidosis) as well as hypermetabolic metastatic endometrial carcinoma. 2. Hypermetabolic mediastinal hilar lymph nodes and pulmonary nodularity with bronchovascular thickening is consistent with sarcoidosis. 3. Solitary hypermetabolic nodule in the medial LEFT lower lobe, new from prior with intense metabolic activity, is concerning for a metastatic deposit to the LEFT lower lobe. 4. Ill-defined hypermetabolic soft tissue along the LEFT operator space is most consistent with local recurrence of endometrial carcinoma. There is entrapment of the LEFT ureter by this hypermetabolic soft tissue with mild hydroureter and hydronephrosis on the LEFT. 5. Additionally there appears to be direct extension from the hypermetabolic left operator space soft tissue to the sigmoid colon with there is a well-defined hypermetabolic lesion in the colon. 6. More distant hypermetabolic  peritoneal metastasis along the ventral peritoneal surface of the RIGHT abdomen beneath RIGHT rectus abdominus muscle. 7. Hypermetabolic lymph nodes in the porta hepatis are favored related to sarcoidosis.   05/19/2020 Cancer Staging   Staging form: Corpus Uteri - Carcinoma and Carcinosarcoma, AJCC 8th Edition - Clinical stage from 05/19/2020: Stage IVB (rcT1, cN2a, cM1) - Signed by [name], MD on 05/19/2020   05/26/2020 Procedure   Successful placement of a right internal jugular approach power injectable Port-A-Cath. The catheter is ready for immediate use   05/29/2020 -  Chemotherapy   The patient had carboplatin and taxol for chemotherapy treatment.     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       REVIEW OF SYSTEMS:   Constitutional: Denies fevers, chills or abnormal weight loss Eyes: Denies blurriness of vision Ears, nose, mouth, throat, and face: Denies mucositis or sore throat Respiratory: Denies cough, dyspnea or wheezes Cardiovascular: Denies palpitation, chest discomfort  Gastrointestinal:  Denies nausea, heartburn or change in bowel habits Skin: Denies abnormal skin rashes Lymphatics: Denies new lymphadenopathy or easy bruising Neurological:Denies numbness, tingling or new weaknesses Behavioral/Psych: Mood is stable, no new changes  All other systems were reviewed with the patient and are negative.  I have reviewed the past medical history,  past surgical history, social history and family history with the patient and they are unchanged  from previous note.  ALLERGIES:  is allergic to shellfish allergy and penicillins.  MEDICATIONS:  Current Outpatient Medications  Medication Sig Dispense Refill  . albuterol (VENTOLIN HFA) 108 (90 Base) MCG/ACT inhaler Inhale 2 puffs into the lungs every 4 (four) hours as needed for wheezing or shortness of breath. 25.5 g 1  . bisoprolol (ZEBETA) 10 MG tablet TAKE 1 TABLET TWICE DAILY 180 tablet 1  . budesonide-formoterol (SYMBICORT) 80-4.5 MCG/ACT inhaler Inhale 2 puffs into the lungs 2 (two) times daily. 3 Inhaler 3  . desonide (DESOWEN) 0.05 % lotion Apply topically 2 (two) times daily. Continue following with dermatologist 59 mL 0  . dexamethasone (DECADRON) 4 MG tablet Take 2 tabs at the night before and 2 tabs the morning of chemotherapy, every 3 weeks, by mouth 36 tablet 6  . dextromethorphan (DELSYM) 30 MG/5ML liquid Take by mouth as needed for cough.     . hydrochlorothiazide (HYDRODIURIL) 25 MG tablet TAKE 1 TABLET EVERY DAY 90 tablet 3  . hydrocortisone 2.5 % cream Apply 1 application topically 2 (two) times daily.    Marland Kitchen lidocaine-prilocaine (EMLA) cream Apply to affected area once 30 g 3  . linaclotide (LINZESS) 145 MCG CAPS capsule Take 1 capsule (145 mcg total) by mouth daily before breakfast. 30 capsule 1  . loratadine (CLARITIN) 10 MG tablet Take 10 mg by mouth daily as needed for allergies.    Marland Kitchen morphine (MSIR) 15 MG tablet Take 1 tablet (15 mg total) by mouth every 4 (four) hours as needed for severe pain. 30 tablet 0  . ondansetron (ZOFRAN) 8 MG tablet Take 1 tablet (8 mg total) by mouth every 8 (eight) hours as needed for nausea. 30 tablet 3  . predniSONE (DELTASONE) 10 MG tablet Take 0.5 tablets (5 mg total) by mouth daily with breakfast. 90 tablet 3  . prochlorperazine (COMPAZINE) 10 MG tablet Take 1 tablet (10 mg total) by mouth every 6 (six) hours as needed for nausea or vomiting. 30 tablet 3  . RABEprazole (ACIPHEX) 20 MG tablet Take 1 tablet (20 mg total) by mouth daily  before breakfast. 90 tablet 3  . simvastatin (ZOCOR) 20 MG tablet Take 1 tablet (20 mg total) by mouth at bedtime. 90 tablet 3  . vitamin C (ASCORBIC ACID) 500 MG tablet Take 500 mg by mouth daily.     No current facility-administered medications for this visit.    PHYSICAL EXAMINATION: ECOG PERFORMANCE STATUS: 1 - Symptomatic but completely ambulatory  Vitals:   06/20/20 0859  BP: (!) 200/78  Pulse: 65  Resp: 18  Temp: 98 F (36.7 C)  SpO2: 100%   Filed Weights   06/20/20 0859  Weight: 218 lb (98.9 kg)    GENERAL:alert, no distress and comfortable SKIN: skin color, texture, turgor are normal, no rashes or significant lesions EYES: normal, Conjunctiva are pink and non-injected, sclera clear OROPHARYNX:no exudate, no erythema and lips, buccal mucosa, and tongue normal  NECK: supple, thyroid normal size, non-tender, without nodularity LYMPH:  no palpable lymphadenopathy in the cervical, axillary or inguinal LUNGS: clear to auscultation and percussion with normal breathing effort HEART: regular rate & rhythm and no murmurs with mild bilateral lower extremity edema ABDOMEN:abdomen soft, non-tender and normal bowel sounds Musculoskeletal:no cyanosis of digits and no clubbing  NEURO: alert & oriented x 3 with fluent speech, no focal motor/sensory deficits  LABORATORY DATA:  I have reviewed the  data as listed    Component Value Date/Time   NA 139 05/26/2020 0850   K 4.0 05/26/2020 0850   CL 100 05/26/2020 0850   CO2 30 05/26/2020 0850   GLUCOSE 131 (H) 05/26/2020 0850   BUN 15 05/26/2020 0850   CREATININE 1.14 (H) 05/26/2020 0850   CREATININE 0.86 08/09/2019 1558   CALCIUM 9.2 05/26/2020 0850   PROT 8.4 (H) 05/26/2020 0850   ALBUMIN 4.0 05/26/2020 0850   AST 21 05/26/2020 0850   AST 25 08/09/2019 1558   ALT 24 05/26/2020 0850   ALT 28 08/09/2019 1558   ALKPHOS 128 (H) 05/26/2020 0850   BILITOT 0.9 05/26/2020 0850   BILITOT 0.5 08/09/2019 1558   GFRNONAA 52 (L)  05/26/2020 0850   GFRNONAA >60 08/09/2019 1558   GFRAA >60 05/26/2020 0850   GFRAA >60 08/09/2019 1558    No results found for: SPEP, UPEP  Lab Results  Component Value Date   WBC 6.5 06/20/2020   NEUTROABS 4.5 06/20/2020   HGB 9.7 (L) 06/20/2020   HCT 28.1 (L) 06/20/2020   MCV 93.7 06/20/2020   PLT 146 (L) 06/20/2020      Chemistry      Component Value Date/Time   NA 139 05/26/2020 0850   K 4.0 05/26/2020 0850   CL 100 05/26/2020 0850   CO2 30 05/26/2020 0850   BUN 15 05/26/2020 0850   CREATININE 1.14 (H) 05/26/2020 0850   CREATININE 0.86 08/09/2019 1558      Component Value Date/Time   CALCIUM 9.2 05/26/2020 0850   ALKPHOS 128 (H) 05/26/2020 0850   AST 21 05/26/2020 0850   AST 25 08/09/2019 1558   ALT 24 05/26/2020 0850   ALT 28 08/09/2019 1558   BILITOT 0.9 05/26/2020 0850   BILITOT 0.5 08/09/2019 1558

## 2020-06-20 NOTE — Telephone Encounter (Signed)
Scheduled appts per 7/6 sch msg. Gave pt a print out of AVS.  

## 2020-06-20 NOTE — Assessment & Plan Note (Signed)
She has minimum pain I refill her prescription of pain medicine just in case she needs to take it

## 2020-06-20 NOTE — Assessment & Plan Note (Signed)
She has mild pancytopenia from treatment Observe closely for now

## 2020-06-20 NOTE — Assessment & Plan Note (Signed)
She has mild persistent bilateral lower extremity edema, stable Observe closely for now

## 2020-06-21 ENCOUNTER — Inpatient Hospital Stay: Payer: Medicare HMO

## 2020-06-21 ENCOUNTER — Other Ambulatory Visit: Payer: Self-pay

## 2020-06-21 ENCOUNTER — Telehealth: Payer: Self-pay

## 2020-06-21 ENCOUNTER — Other Ambulatory Visit: Payer: Self-pay | Admitting: Hematology and Oncology

## 2020-06-21 VITALS — BP 159/72 | HR 68 | Temp 98.7°F | Resp 17

## 2020-06-21 DIAGNOSIS — Z5111 Encounter for antineoplastic chemotherapy: Secondary | ICD-10-CM | POA: Diagnosis not present

## 2020-06-21 DIAGNOSIS — C775 Secondary and unspecified malignant neoplasm of intrapelvic lymph nodes: Secondary | ICD-10-CM | POA: Diagnosis not present

## 2020-06-21 DIAGNOSIS — I10 Essential (primary) hypertension: Secondary | ICD-10-CM

## 2020-06-21 DIAGNOSIS — Z79899 Other long term (current) drug therapy: Secondary | ICD-10-CM | POA: Diagnosis not present

## 2020-06-21 DIAGNOSIS — C541 Malignant neoplasm of endometrium: Secondary | ICD-10-CM

## 2020-06-21 DIAGNOSIS — C7802 Secondary malignant neoplasm of left lung: Secondary | ICD-10-CM

## 2020-06-21 MED ORDER — SODIUM CHLORIDE 0.9 % IV SOLN
Freq: Once | INTRAVENOUS | Status: AC
  Filled 2020-06-21: qty 250

## 2020-06-21 MED ORDER — DIPHENHYDRAMINE HCL 50 MG/ML IJ SOLN
50.0000 mg | Freq: Once | INTRAMUSCULAR | Status: AC
  Administered 2020-06-21: 50 mg via INTRAVENOUS

## 2020-06-21 MED ORDER — SODIUM CHLORIDE 0.9 % IV SOLN
792.0000 mg | Freq: Once | INTRAVENOUS | Status: AC
  Administered 2020-06-21: 790 mg via INTRAVENOUS
  Filled 2020-06-21: qty 79

## 2020-06-21 MED ORDER — SODIUM CHLORIDE 0.9 % IV SOLN
175.0000 mg/m2 | Freq: Once | INTRAVENOUS | Status: AC
  Administered 2020-06-21: 324 mg via INTRAVENOUS
  Filled 2020-06-21: qty 54

## 2020-06-21 MED ORDER — AMLODIPINE BESYLATE 10 MG PO TABS: 10.0000 mg | ORAL_TABLET | Freq: Every day | ORAL | 5 refills | Status: DC

## 2020-06-21 MED ORDER — SODIUM CHLORIDE 0.9 % IV SOLN
10.0000 mg | Freq: Once | INTRAVENOUS | Status: AC
  Administered 2020-06-21: 10 mg via INTRAVENOUS
  Filled 2020-06-21: qty 10

## 2020-06-21 MED ORDER — DIPHENHYDRAMINE HCL 50 MG/ML IJ SOLN
INTRAMUSCULAR | Status: AC
  Filled 2020-06-21: qty 1

## 2020-06-21 MED ORDER — CLONIDINE HCL 0.1 MG PO TABS
ORAL_TABLET | ORAL | Status: AC
  Filled 2020-06-21: qty 1

## 2020-06-21 MED ORDER — PALONOSETRON HCL INJECTION 0.25 MG/5ML
INTRAVENOUS | Status: AC
  Filled 2020-06-21: qty 5

## 2020-06-21 MED ORDER — SODIUM CHLORIDE 0.9 % IV SOLN
150.0000 mg | Freq: Once | INTRAVENOUS | Status: AC
  Administered 2020-06-21: 150 mg via INTRAVENOUS
  Filled 2020-06-21: qty 150

## 2020-06-21 MED ORDER — PALONOSETRON HCL INJECTION 0.25 MG/5ML
0.2500 mg | Freq: Once | INTRAVENOUS | Status: AC
  Administered 2020-06-21: 0.25 mg via INTRAVENOUS

## 2020-06-21 MED ORDER — FAMOTIDINE IN NACL 20-0.9 MG/50ML-% IV SOLN
20.0000 mg | Freq: Once | INTRAVENOUS | Status: AC
  Administered 2020-06-21: 20 mg via INTRAVENOUS

## 2020-06-21 MED ORDER — FAMOTIDINE IN NACL 20-0.9 MG/50ML-% IV SOLN
INTRAVENOUS | Status: AC
  Filled 2020-06-21: qty 50

## 2020-06-21 MED ORDER — HEPARIN SOD (PORK) LOCK FLUSH 100 UNIT/ML IV SOLN
500.0000 [IU] | Freq: Once | INTRAVENOUS | Status: AC | PRN
  Administered 2020-06-21: 500 [IU]
  Filled 2020-06-21: qty 5

## 2020-06-21 MED ORDER — CLONIDINE HCL 0.1 MG PO TABS
0.1000 mg | ORAL_TABLET | Freq: Once | ORAL | Status: AC
  Administered 2020-06-21: 0.1 mg via ORAL

## 2020-06-21 MED ORDER — SODIUM CHLORIDE 0.9% FLUSH
10.0000 mL | INTRAVENOUS | Status: DC | PRN
  Administered 2020-06-21: 10 mL
  Filled 2020-06-21: qty 10

## 2020-06-21 MED FILL — AMLODIPINE BESYLATE 10 MG T: 10 | 30 days supply | Qty: 30 | Fill #0

## 2020-06-21 NOTE — Telephone Encounter (Signed)
Patient requesting TSH be drawn at next visit. Made Dr Alvy Bimler aware.

## 2020-06-21 NOTE — Progress Notes (Signed)
MD Lorenso Courier would like dose of Carboplatin to be increased based on improved kidney function.   Larene Beach, PharmD

## 2020-06-21 NOTE — Progress Notes (Signed)
Systolic BP 162 even after recheck. Contacted on call MD, Dr. Lorenso Courier and he gave verbal order for clonidine 0.1mg  now and he will call in a new prescription to pharmacy for Amlodipine and patient is aware. Okay to treat today

## 2020-06-21 NOTE — Patient Instructions (Signed)
[facility] Discharge Instructions for Patients Receiving Chemotherapy  Today you received the following chemotherapy agents: Paclitaxel (Taxol) and Carboplatin.  To help prevent nausea and vomiting after your treatment, we encourage you to take your nausea medication as directed by your MD.   If you develop nausea and vomiting that is not controlled by your nausea medication, call the clinic.   BELOW ARE SYMPTOMS THAT SHOULD BE REPORTED IMMEDIATELY:  *FEVER GREATER THAN 100.5 F  *CHILLS WITH OR WITHOUT FEVER  NAUSEA AND VOMITING THAT IS NOT CONTROLLED WITH YOUR NAUSEA MEDICATION  *UNUSUAL SHORTNESS OF BREATH  *UNUSUAL BRUISING OR BLEEDING  TENDERNESS IN MOUTH AND THROAT WITH OR WITHOUT PRESENCE OF ULCERS  *URINARY PROBLEMS  *BOWEL PROBLEMS  UNUSUAL RASH Items with * indicate a potential emergency and should be followed up as soon as possible.  Feel free to call the clinic should you have any questions or concerns. The clinic phone number is [phone].  Please show the CHEMO ALERT CARD at check-in to the Emergency Department and triage nurse.    

## 2020-06-22 ENCOUNTER — Other Ambulatory Visit: Payer: Self-pay | Admitting: Hematology and Oncology

## 2020-06-22 DIAGNOSIS — C541 Malignant neoplasm of endometrium: Secondary | ICD-10-CM

## 2020-06-22 DIAGNOSIS — R5383 Other fatigue: Secondary | ICD-10-CM | POA: Insufficient documentation

## 2020-06-29 ENCOUNTER — Telehealth: Payer: Self-pay

## 2020-06-29 ENCOUNTER — Other Ambulatory Visit: Payer: Self-pay | Admitting: Hematology and Oncology

## 2020-06-29 MED ORDER — MAGNESIUM CITRATE PO SOLN: 1.0000 | Freq: Once | ORAL | 1 refills | Status: AC

## 2020-06-29 NOTE — Telephone Encounter (Signed)
TC to pt per Dr Alvy Bimler  1) How many days not having bowel movement?  Patient stated 3 days  2) Advised Miralax twice daily and sennokot 2 pills TID (both are over the counter) per Dr Alvy Bimler.  Patient stated she's tried Miralax and it has been unsuccessful. But has not tried sennokot.  3) if already taking these, then she can add magnesium citrate (I sent to [facility])  Pt stated that she would prefer trying the Magnesium citrate and that she would pick that up from [place] later today.

## 2020-06-29 NOTE — Telephone Encounter (Signed)
Received TC from patient stating that her bowels are not moving. Requesting prescription be sent it. Dr Alvy Bimler made aware.

## 2020-07-11 ENCOUNTER — Encounter: Payer: Self-pay | Admitting: Hematology and Oncology

## 2020-07-11 ENCOUNTER — Other Ambulatory Visit: Payer: Self-pay

## 2020-07-11 ENCOUNTER — Inpatient Hospital Stay: Payer: Medicare HMO | Admitting: Hematology and Oncology

## 2020-07-11 ENCOUNTER — Inpatient Hospital Stay: Payer: Medicare HMO

## 2020-07-11 VITALS — BP 178/75 | HR 74 | Temp 98.0°F | Resp 18 | Ht 65.5 in | Wt 213.0 lb

## 2020-07-11 DIAGNOSIS — G893 Neoplasm related pain (acute) (chronic): Secondary | ICD-10-CM | POA: Diagnosis not present

## 2020-07-11 DIAGNOSIS — C775 Secondary and unspecified malignant neoplasm of intrapelvic lymph nodes: Secondary | ICD-10-CM

## 2020-07-11 DIAGNOSIS — R5383 Other fatigue: Secondary | ICD-10-CM

## 2020-07-11 DIAGNOSIS — N1330 Unspecified hydronephrosis: Secondary | ICD-10-CM | POA: Diagnosis not present

## 2020-07-11 DIAGNOSIS — C541 Malignant neoplasm of endometrium: Secondary | ICD-10-CM | POA: Diagnosis not present

## 2020-07-11 DIAGNOSIS — R600 Localized edema: Secondary | ICD-10-CM

## 2020-07-11 DIAGNOSIS — R911 Solitary pulmonary nodule: Secondary | ICD-10-CM | POA: Diagnosis not present

## 2020-07-11 DIAGNOSIS — D869 Sarcoidosis, unspecified: Secondary | ICD-10-CM

## 2020-07-11 DIAGNOSIS — D61818 Other pancytopenia: Secondary | ICD-10-CM | POA: Diagnosis not present

## 2020-07-11 DIAGNOSIS — Z79899 Other long term (current) drug therapy: Secondary | ICD-10-CM | POA: Diagnosis not present

## 2020-07-11 DIAGNOSIS — Z5111 Encounter for antineoplastic chemotherapy: Secondary | ICD-10-CM | POA: Diagnosis not present

## 2020-07-11 DIAGNOSIS — C7802 Secondary malignant neoplasm of left lung: Secondary | ICD-10-CM

## 2020-07-11 LAB — CBC WITH DIFFERENTIAL (CANCER CENTER ONLY)
Abs Immature Granulocytes: 0.09 10*3/uL — ABNORMAL HIGH (ref 0.00–0.07)
Basophils Absolute: 0 10*3/uL (ref 0.0–0.1)
Basophils Relative: 0 %
Eosinophils Absolute: 0 10*3/uL (ref 0.0–0.5)
Eosinophils Relative: 0 %
HCT: 24.9 % — ABNORMAL LOW (ref 36.0–46.0)
Hemoglobin: 8.9 g/dL — ABNORMAL LOW (ref 12.0–15.0)
Immature Granulocytes: 2 %
Lymphocytes Relative: 13 %
Lymphs Abs: 0.8 10*3/uL (ref 0.7–4.0)
MCH: 33.7 pg (ref 26.0–34.0)
MCHC: 35.7 g/dL (ref 30.0–36.0)
MCV: 94.3 fL (ref 80.0–100.0)
Monocytes Absolute: 0.1 10*3/uL (ref 0.1–1.0)
Monocytes Relative: 2 %
Neutro Abs: 4.7 10*3/uL (ref 1.7–7.7)
Neutrophils Relative %: 83 %
Platelet Count: 104 10*3/uL — ABNORMAL LOW (ref 150–400)
RBC: 2.64 MIL/uL — ABNORMAL LOW (ref 3.87–5.11)
RDW: 17.7 % — ABNORMAL HIGH (ref 11.5–15.5)
WBC Count: 5.7 10*3/uL (ref 4.0–10.5)
nRBC: 0.5 % — ABNORMAL HIGH (ref 0.0–0.2)

## 2020-07-11 LAB — CMP (CANCER CENTER ONLY)
ALT: 23 U/L (ref 0–44)
AST: 15 U/L (ref 15–41)
Albumin: 3.6 g/dL (ref 3.5–5.0)
Alkaline Phosphatase: 131 U/L — ABNORMAL HIGH (ref 38–126)
Anion gap: 10 (ref 5–15)
BUN: 18 mg/dL (ref 8–23)
CO2: 24 mmol/L (ref 22–32)
Calcium: 10.1 mg/dL (ref 8.9–10.3)
Chloride: 107 mmol/L (ref 98–111)
Creatinine: 0.87 mg/dL (ref 0.44–1.00)
GFR, Est AFR Am: >60 mL/min (ref >60)
GFR, Estimated: >60 mL/min (ref >60)
Glucose, Bld: 152 mg/dL — ABNORMAL HIGH (ref 70–99)
Potassium: 4.5 mmol/L (ref 3.5–5.1)
Sodium: 141 mmol/L (ref 135–145)
Total Bilirubin: 0.4 mg/dL (ref 0.3–1.2)
Total Protein: 8.2 g/dL — ABNORMAL HIGH (ref 6.5–8.1)

## 2020-07-11 LAB — TSH: TSH: 0.579 u[IU]/mL (ref 0.308–3.960)

## 2020-07-11 MED ORDER — HEPARIN SOD (PORK) LOCK FLUSH 100 UNIT/ML IV SOLN
500.0000 [IU] | Freq: Once | INTRAVENOUS | Status: AC | PRN
  Administered 2020-07-11: 500 [IU]
  Filled 2020-07-11: qty 5

## 2020-07-11 MED ORDER — SODIUM CHLORIDE 0.9 % IV SOLN
175.0000 mg/m2 | Freq: Once | INTRAVENOUS | Status: AC
  Administered 2020-07-11: 372 mg via INTRAVENOUS
  Filled 2020-07-11: qty 62

## 2020-07-11 MED ORDER — SODIUM CHLORIDE 0.9 % IV SOLN
10.0000 mg | Freq: Once | INTRAVENOUS | Status: AC
  Administered 2020-07-11: 10 mg via INTRAVENOUS
  Filled 2020-07-11: qty 10

## 2020-07-11 MED ORDER — PALONOSETRON HCL INJECTION 0.25 MG/5ML
INTRAVENOUS | Status: AC
  Filled 2020-07-11: qty 5

## 2020-07-11 MED ORDER — DIPHENHYDRAMINE HCL 50 MG/ML IJ SOLN
25.0000 mg | Freq: Once | INTRAMUSCULAR | Status: AC
  Administered 2020-07-11: 25 mg via INTRAVENOUS

## 2020-07-11 MED ORDER — SODIUM CHLORIDE 0.9% FLUSH
10.0000 mL | INTRAVENOUS | Status: DC | PRN
  Administered 2020-07-11: 10 mL
  Filled 2020-07-11: qty 10

## 2020-07-11 MED ORDER — SODIUM CHLORIDE 0.9 % IV SOLN
643.0000 mg | Freq: Once | INTRAVENOUS | Status: AC
  Administered 2020-07-11: 640 mg via INTRAVENOUS
  Filled 2020-07-11: qty 64

## 2020-07-11 MED ORDER — FAMOTIDINE IN NACL 20-0.9 MG/50ML-% IV SOLN
INTRAVENOUS | Status: AC
  Filled 2020-07-11: qty 50

## 2020-07-11 MED ORDER — FAMOTIDINE IN NACL 20-0.9 MG/50ML-% IV SOLN
20.0000 mg | Freq: Once | INTRAVENOUS | Status: AC
  Administered 2020-07-11: 20 mg via INTRAVENOUS

## 2020-07-11 MED ORDER — SODIUM CHLORIDE 0.9 % IV SOLN
150.0000 mg | Freq: Once | INTRAVENOUS | Status: AC
  Administered 2020-07-11: 150 mg via INTRAVENOUS
  Filled 2020-07-11: qty 150

## 2020-07-11 MED ORDER — DIPHENHYDRAMINE HCL 50 MG/ML IJ SOLN
INTRAMUSCULAR | Status: AC
  Filled 2020-07-11: qty 1

## 2020-07-11 MED ORDER — PALONOSETRON HCL INJECTION 0.25 MG/5ML
0.2500 mg | Freq: Once | INTRAVENOUS | Status: AC
  Administered 2020-07-11: 0.25 mg via INTRAVENOUS

## 2020-07-11 MED ORDER — SODIUM CHLORIDE 0.9 % IV SOLN
Freq: Once | INTRAVENOUS | Status: AC
  Filled 2020-07-11: qty 250

## 2020-07-11 NOTE — Assessment & Plan Note (Signed)
She has mild persistent bilateral lower extremity edema, stable/improved Observe closely for now

## 2020-07-11 NOTE — Assessment & Plan Note (Signed)
She has excellent urine output and normal kidney function Observe for now

## 2020-07-11 NOTE — Assessment & Plan Note (Signed)
I plan to repeat CT imaging again for further follow-up She is not symptomatic

## 2020-07-11 NOTE — Assessment & Plan Note (Signed)
This is multifactorial, secondary to side effects of therapy I plan to reduce chemotherapy dose a little bit

## 2020-07-11 NOTE — Assessment & Plan Note (Signed)
Overall, she tolerated chemotherapy very well with minimum side effects She is developing progressive pancytopenia Plan to reduce the dose of carboplatin I recommend CT imaging prior to next dose after today's treatment and she is in agreement

## 2020-07-11 NOTE — Patient Instructions (Signed)
[facility] Discharge Instructions for Patients Receiving Chemotherapy  Today you received the following chemotherapy agents: paclitaxel and carboplatin.  To help prevent nausea and vomiting after your treatment, we encourage you to take your nausea medication as directed.   If you develop nausea and vomiting that is not controlled by your nausea medication, call the clinic.   BELOW ARE SYMPTOMS THAT SHOULD BE REPORTED IMMEDIATELY:  *FEVER GREATER THAN 100.5 F  *CHILLS WITH OR WITHOUT FEVER  NAUSEA AND VOMITING THAT IS NOT CONTROLLED WITH YOUR NAUSEA MEDICATION  *UNUSUAL SHORTNESS OF BREATH  *UNUSUAL BRUISING OR BLEEDING  TENDERNESS IN MOUTH AND THROAT WITH OR WITHOUT PRESENCE OF ULCERS  *URINARY PROBLEMS  *BOWEL PROBLEMS  UNUSUAL RASH Items with * indicate a potential emergency and should be followed up as soon as possible.  Feel free to call the clinic should you have any questions or concerns. The clinic phone number is [phone].  Please show the CHEMO ALERT CARD at check-in to the Emergency Department and triage nurse.   

## 2020-07-11 NOTE — Assessment & Plan Note (Signed)
She has no further pain She has pain medicine to take as needed

## 2020-07-11 NOTE — Progress Notes (Signed)
[facility] OFFICE PROGRESS NOTE  Patient Care Team: [name], [name], MD as PCP - General (Family Medicine)  ASSESSMENT & PLAN:  Endometrial cancer ([place]) Overall, she tolerated chemotherapy very well with minimum side effects She is developing progressive pancytopenia Plan to reduce the dose of carboplatin I recommend CT imaging prior to next dose after today's treatment and she is in agreement  Pancytopenia, acquired [facility]) This is multifactorial, secondary to side effects of therapy I plan to reduce chemotherapy dose a little bit  Cancer associated pain She has no further pain She has pain medicine to take as needed  Bilateral lower extremity edema She has mild persistent bilateral lower extremity edema, stable/improved Observe closely for now  Lung nodule seen on imaging study I plan to repeat CT imaging again for further follow-up She is not symptomatic  Hydronephrosis of left kidney She has excellent urine output and normal kidney function Observe for now   Orders Placed This Encounter  Procedures  . CT CHEST W CONTRAST    Standing Status:   Future    Standing Expiration Date:   07/11/2021    Order Specific Question:   If indicated for the ordered procedure, I authorize the administration of contrast media per Radiology protocol    Answer:   Yes    Order Specific Question:   Preferred imaging location?    Answer:   [facility] [place]    Order Specific Question:   Radiology Contrast Protocol - do NOT remove file path    Answer:   \\[email or web address]  . CT ABDOMEN PELVIS W CONTRAST    Standing Status:   Future    Standing Expiration Date:   07/11/2021    Order Specific Question:   If indicated for the ordered procedure, I authorize the administration of contrast media per Radiology protocol    Answer:   Yes    Order Specific Question:   Preferred imaging location?    Answer:   [facility] [place]    Order Specific Question:    Radiology Contrast Protocol - do NOT remove file path    Answer:   \\[email or web address]    All questions were answered. The patient knows to call the clinic with any problems, questions or concerns. The total time spent in the appointment was 30 minutes encounter with patients including review of chart and various tests results, discussions about plan of care and coordination of care plan   [name], MD 07/11/2020 11:15 AM  INTERVAL HISTORY: Please see below for problem oriented charting. She returns with her son for further follow-up She is doing well She denies further abdominal pain Leg swelling is marginally improved She denies peripheral neuropathy She continues to have intermittent constipation, resolved with laxatives  SUMMARY OF ONCOLOGIC HISTORY: Oncology History Overview Note  ER/PR neg, MSI stable   Endometrial cancer ([place])  06/15/2018 Surgery   Surgeon: Donaciano Eva     Operation: Robotic-assisted laparoscopic total hysterectomy with bilateral salpingoophorectomy, SLN biopsy     Operative Findings:  : 6cm uterus, normal tubes and ovaries, omentum adherent to the anterior abdominal wall and uterine fundus. No suspicious nodes.   06/15/2018 Pathology Results   Procedure: Total hysterectomy with bilateral salpingo-oophorectomy. Bilateral obturator sentinel lymph node biopsies. Histologic type: Endometrioid adenocarcinoma. Histologic Grade: FIGO grade 1. Myometrial invasion: Depth of invasion: 15 mm Myometrial thickness: 20 mm Uterine Serosa Involvement: Not identified. Cervical stromal involvement: Not identified. Extent of involvement of other organs: Uninvolved. Lymphovascular invasion: Present.  Regional Lymph Nodes: Examined: 2 Sentinel 0 Non-sentinel 2 Total Lymph nodes with metastasis: 0 Isolated tumor cells (< 0.2 mm): 2 Micrometastasis: (> 0.2 mm and < 2.0 mm): 0 Macrometastasis: (> 2.0 mm): 0 Extracapsular extension:  N/A. Tumor block for ancillary studies: 4Q-S. MMR / MSI testing: Will be ordered. Pathologic Stage Classification (pTNM, AJCC 8th edition): pT1b, pN0(i+)   07/20/2018 Imaging   1. Surgical changes from recent hysterectomy. No findings suspicious for residual tumor or adenopathy in the pelvis. 2. No CT findings to suggest omental, peritoneal surface or solid organ metastatic disease in the abdomen. 3. Changes consistent with known sarcoidosis in the chest with bulky calcified mediastinal and hilar lymph nodes and interstitial lung disease. I do not see any definite CT findings to suggest metastatic disease involving the chest.   08/19/2018 - 09/17/2018 Radiation Therapy   08/19/18 - 09/17/18: Vaginal Cuff, 3 cm cylinder with treatment length of 3 cm / 30 Gy delivered in 5 fractions of 6 Gy, Brachytherapy / HDR, Iridium-192   05/2005: Left Chest Wall keloid   04/2004 - 05/2004: Anus, pelvis /inguinal ( Dr. Tammi Klippel)   01/19/2020 Imaging   1. No definite CT findings of the abdomen or pelvis to explain pain. 2. The sigmoid colon and rectum are decompressed although slightly thickened appearing, suggestive of nonspecific infectious, inflammatory, or ischemic colitis. Correlate for referable clinical symptoms. 3. There is trace, nonspecific free fluid in the low pelvis, which may be reactive, although would be difficult to distinguish from postoperative and post treatment soft tissue change in the pelvis given history of hysterectomy and rectal cancer. 4. Unchanged prominent retroperitoneal and iliac lymph nodes. No new findings suspicious for metastatic disease in the abdomen or pelvis. 5. Bibasilar pulmonary fibrosis and mediastinal and hilar lymphadenopathy, better evaluated by prior CT chest and consistent with patient diagnosis of sarcoidosis.       04/18/2020 Imaging   Venous Doppler Left:  - No evidence of deep vein thrombosis seen in the left lower extremity, from the common femoral through the  popliteal veins.  - No evidence of superficial venous thrombosis in the left lower extremity.  - There is no evidence of venous reflux seen in the left lower extremity.  - Rouleax flow noted in the superficial system.    05/18/2020 PET scan   1. Complex scan with a combination of hypermetabolic granulomatous disease (sarcoidosis) as well as hypermetabolic metastatic endometrial carcinoma. 2. Hypermetabolic mediastinal hilar lymph nodes and pulmonary nodularity with bronchovascular thickening is consistent with sarcoidosis. 3. Solitary hypermetabolic nodule in the medial LEFT lower lobe, new from prior with intense metabolic activity, is concerning for a metastatic deposit to the LEFT lower lobe. 4. Ill-defined hypermetabolic soft tissue along the LEFT operator space is most consistent with local recurrence of endometrial carcinoma. There is entrapment of the LEFT ureter by this hypermetabolic soft tissue with mild hydroureter and hydronephrosis on the LEFT. 5. Additionally there appears to be direct extension from the hypermetabolic left operator space soft tissue to the sigmoid colon with there is a well-defined hypermetabolic lesion in the colon. 6. More distant hypermetabolic peritoneal metastasis along the ventral peritoneal surface of the RIGHT abdomen beneath RIGHT rectus abdominus muscle. 7. Hypermetabolic lymph nodes in the porta hepatis are favored related to sarcoidosis.   05/19/2020 Cancer Staging   Staging form: Corpus Uteri - Carcinoma and Carcinosarcoma, AJCC 8th Edition - Clinical stage from 05/19/2020: Stage IVB (rcT1, cN2a, cM1) - Signed by [name], MD on 05/19/2020  05/26/2020 Procedure   Successful placement of a right internal jugular approach power injectable Port-A-Cath. The catheter is ready for immediate use   05/29/2020 -  Chemotherapy   The patient had carboplatin and taxol for chemotherapy treatment.     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       REVIEW OF SYSTEMS:   Constitutional: Denies fevers, chills or abnormal weight loss Eyes: Denies blurriness of vision Ears, nose, mouth, throat, and face: Denies mucositis or sore throat Respiratory: Denies cough, dyspnea or wheezes Cardiovascular: Denies palpitation, chest discomfort or lower extremity swelling Skin: Denies abnormal skin rashes Lymphatics: Denies new lymphadenopathy or easy bruising Neurological:Denies numbness, tingling or new weaknesses Behavioral/Psych: Mood is stable, no new changes  All other systems were reviewed with the patient and are negative.  I have reviewed the past medical history, past surgical history, social history and family history with the patient and they are unchanged from previous note.  ALLERGIES:  is allergic to shellfish allergy and penicillins.  MEDICATIONS:  Current Outpatient Medications  Medication Sig Dispense Refill  . albuterol (VENTOLIN HFA) 108 (90 Base) MCG/ACT inhaler Inhale 2 puffs into the lungs every 4 (four) hours as needed for wheezing or shortness of breath. 25.5 g 1  . amLODipine (NORVASC) 10 MG tablet Take 1 tablet (10 mg total) by mouth daily. 30 tablet 5  . bisoprolol (ZEBETA) 10 MG tablet TAKE 1 TABLET TWICE DAILY 180 tablet 1  . budesonide-formoterol (SYMBICORT) 80-4.5 MCG/ACT inhaler Inhale 2 puffs into the lungs 2 (two) times daily. 3 Inhaler 3  . desonide (DESOWEN) 0.05 % lotion Apply topically 2 (two) times daily. Continue following with dermatologist 59 mL 0  . dexamethasone (DECADRON) 4 MG  tablet Take 2 tabs at the night before and 2 tabs the morning of chemotherapy, every 3 weeks, by mouth 36 tablet 6  . dextromethorphan (DELSYM) 30 MG/5ML liquid Take by mouth as needed for cough.     . hydrochlorothiazide (HYDRODIURIL) 25 MG tablet TAKE 1 TABLET EVERY DAY 90 tablet 3  . hydrocortisone 2.5 % cream Apply 1 application topically 2 (two) times daily.    Marland Kitchen lidocaine-prilocaine (EMLA) cream Apply to affected area once 30 g 3  . linaclotide (LINZESS) 145 MCG CAPS capsule Take 1 capsule (145 mcg total) by mouth daily before breakfast. 30 capsule 1  . loratadine (CLARITIN) 10 MG tablet Take 10 mg by mouth daily as needed for allergies.    Marland Kitchen morphine (MSIR) 15 MG tablet Take 1 tablet (15 mg total) by mouth every 4 (four) hours as needed for severe pain. 30 tablet 0  . ondansetron (ZOFRAN) 8 MG tablet Take 1 tablet (8 mg total) by mouth every 8 (eight) hours as needed for nausea. 30 tablet 3  . predniSONE (DELTASONE) 10 MG tablet Take 0.5 tablets (5 mg total) by mouth daily with breakfast. 90 tablet 3  . prochlorperazine (COMPAZINE) 10 MG tablet Take 1 tablet (10 mg total) by mouth every 6 (six) hours as needed for nausea or vomiting. 30 tablet 3  . RABEprazole (  ACIPHEX) 20 MG tablet Take 1 tablet (20 mg total) by mouth daily before breakfast. 90 tablet 3  . simvastatin (ZOCOR) 20 MG tablet Take 1 tablet (20 mg total) by mouth at bedtime. 90 tablet 3  . vitamin C (ASCORBIC ACID) 500 MG tablet Take 500 mg by mouth daily.     No current facility-administered medications for this visit.   Facility-Administered Medications Ordered in Other Visits  Medication Dose Route Frequency Provider Last Rate Last Admin  . CARBOplatin (PARAPLATIN) 640 mg in sodium chloride 0.9 % 250 mL chemo infusion  640 mg Intravenous Once Alvy Bimler, Derian Pfost, MD      . dexamethasone (DECADRON) 10 mg in sodium chloride 0.9 % 50 mL IVPB  10 mg Intravenous Once Alvy Bimler, Carmello Cabiness, MD      . famotidine (PEPCID) IVPB 20 mg premix  20 mg  Intravenous Once Alvy Bimler, Jonathen Rathman, MD 200 mL/hr at 07/11/20 1112 20 mg at 07/11/20 1112  . fosaprepitant (EMEND) 150 mg in sodium chloride 0.9 % 145 mL IVPB  150 mg Intravenous Once Alvy Bimler, Argentina Kosch, MD      . heparin lock flush 100 unit/mL  500 Units Intracatheter Once PRN Alvy Bimler, Primitivo Merkey, MD      . PACLitaxel (TAXOL) 372 mg in sodium chloride 0.9 % 500 mL chemo infusion (> 54m/m2)  175 mg/m2 (Treatment Plan Recorded) Intravenous Once Fadi Menter, MD      . sodium chloride flush (NS) 0.9 % injection 10 mL  10 mL Intracatheter PRN GAlvy Bimler Ahlivia Salahuddin, MD        PHYSICAL EXAMINATION: ECOG PERFORMANCE STATUS: 1 - Symptomatic but completely ambulatory  Vitals:   07/11/20 1003  BP: (!) 178/75  Pulse: 74  Resp: 18  Temp: 98 F (36.7 C)  SpO2: 100%   Filed Weights   07/11/20 1003  Weight: (!) 213 lb (96.6 kg)    GENERAL:alert, no distress and comfortable SKIN: skin color, texture, turgor are normal, no rashes or significant lesions EYES: normal, Conjunctiva are pink and non-injected, sclera clear OROPHARYNX:no exudate, no erythema and lips, buccal mucosa, and tongue normal  NECK: supple, thyroid normal size, non-tender, without nodularity LYMPH:  no palpable lymphadenopathy in the cervical, axillary or inguinal LUNGS: clear to auscultation and percussion with normal breathing effort HEART: regular rate & rhythm and no murmurs with mild lower extremity edema ABDOMEN:abdomen soft, non-tender and normal bowel sounds Musculoskeletal:no cyanosis of digits and no clubbing  NEURO: alert & oriented x 3 with fluent speech, no focal motor/sensory deficits  LABORATORY DATA:  I have reviewed the data as listed    Component Value Date/Time   NA 141 07/11/2020 0945   K 4.5 07/11/2020 0945   CL 107 07/11/2020 0945   CO2 24 07/11/2020 0945   GLUCOSE 152 (H) 07/11/2020 0945   BUN 18 07/11/2020 0945   CREATININE 0.87 07/11/2020 0945   CALCIUM 10.1 07/11/2020 0945   PROT 8.2 (H) 07/11/2020 0945   ALBUMIN 3.6  07/11/2020 0945   AST 15 07/11/2020 0945   ALT 23 07/11/2020 0945   ALKPHOS 131 (H) 07/11/2020 0945   BILITOT 0.4 07/11/2020 0945   GFRNONAA >60 07/11/2020 0945   GFRAA >60 07/11/2020 0945    No results found for: SPEP, UPEP  Lab Results  Component Value Date   WBC 5.7 07/11/2020   NEUTROABS 4.7 07/11/2020   HGB 8.9 (L) 07/11/2020   HCT 24.9 (L) 07/11/2020   MCV 94.3 07/11/2020   PLT 104 (L) 07/11/2020      Chemistry  Component Value Date/Time   NA 141 07/11/2020 0945   K 4.5 07/11/2020 0945   CL 107 07/11/2020 0945   CO2 24 07/11/2020 0945   BUN 18 07/11/2020 0945   CREATININE 0.87 07/11/2020 0945      Component Value Date/Time   CALCIUM 10.1 07/11/2020 0945   ALKPHOS 131 (H) 07/11/2020 0945   AST 15 07/11/2020 0945   ALT 23 07/11/2020 0945   BILITOT 0.4 07/11/2020 0945

## 2020-07-17 ENCOUNTER — Telehealth: Payer: Self-pay | Admitting: Oncology

## 2020-07-17 NOTE — Telephone Encounter (Signed)
Notified [name] the her appointment on 08/14/20 with Dr. Sondra Come has been canceled due to ongoing chemotherapy.  She verbalized understanding.

## 2020-07-27 DIAGNOSIS — C55 Malignant neoplasm of uterus, part unspecified: Secondary | ICD-10-CM | POA: Diagnosis not present

## 2020-07-31 ENCOUNTER — Ambulatory Visit (HOSPITAL_COMMUNITY)
Admission: RE | Admit: 2020-07-31 | Discharge: 2020-07-31 | Disposition: A | Discharge status: Home / Self Care | Payer: Medicare HMO | Source: Ambulatory Visit | Attending: Hematology and Oncology | Admitting: Hematology and Oncology

## 2020-07-31 ENCOUNTER — Encounter (HOSPITAL_COMMUNITY): Payer: Self-pay

## 2020-07-31 ENCOUNTER — Other Ambulatory Visit: Payer: Self-pay

## 2020-07-31 DIAGNOSIS — R911 Solitary pulmonary nodule: Secondary | ICD-10-CM | POA: Insufficient documentation

## 2020-07-31 DIAGNOSIS — D869 Sarcoidosis, unspecified: Secondary | ICD-10-CM | POA: Diagnosis not present

## 2020-07-31 DIAGNOSIS — C541 Malignant neoplasm of endometrium: Secondary | ICD-10-CM | POA: Diagnosis not present

## 2020-07-31 DIAGNOSIS — C539 Malignant neoplasm of cervix uteri, unspecified: Secondary | ICD-10-CM | POA: Diagnosis not present

## 2020-07-31 DIAGNOSIS — C7800 Secondary malignant neoplasm of unspecified lung: Secondary | ICD-10-CM | POA: Diagnosis not present

## 2020-07-31 HISTORY — DX: Malignant neoplasm of endometrium: C54.1

## 2020-07-31 MED ORDER — HEPARIN SOD (PORK) LOCK FLUSH 100 UNIT/ML IV SOLN
500.0000 [IU] | Freq: Once | INTRAVENOUS | Status: AC
  Administered 2020-07-31: 500 [IU] via INTRAVENOUS

## 2020-07-31 MED ORDER — SODIUM CHLORIDE (PF) 0.9 % IJ SOLN
INTRAMUSCULAR | Status: AC
  Filled 2020-07-31: qty 50

## 2020-07-31 MED ORDER — IOHEXOL 300 MG/ML  SOLN
100.0000 mL | Freq: Once | INTRAMUSCULAR | Status: AC | PRN
  Administered 2020-07-31: 100 mL via INTRAVENOUS

## 2020-07-31 MED ORDER — HEPARIN SOD (PORK) LOCK FLUSH 100 UNIT/ML IV SOLN
INTRAVENOUS | Status: AC
  Filled 2020-07-31: qty 5

## 2020-08-01 ENCOUNTER — Ambulatory Visit: Payer: Medicare HMO | Admitting: Family Medicine

## 2020-08-01 MED FILL — AMLODIPINE BESYLATE 10 MG T: 10 | 30 days supply | Qty: 30 | Fill #1

## 2020-08-02 ENCOUNTER — Other Ambulatory Visit: Payer: Self-pay | Admitting: Hematology and Oncology

## 2020-08-02 ENCOUNTER — Telehealth: Payer: Self-pay | Admitting: Hematology and Oncology

## 2020-08-02 ENCOUNTER — Encounter: Payer: Self-pay | Admitting: Hematology and Oncology

## 2020-08-02 ENCOUNTER — Inpatient Hospital Stay: Payer: Medicare HMO

## 2020-08-02 ENCOUNTER — Inpatient Hospital Stay: Payer: Medicare HMO | Admitting: Hematology and Oncology

## 2020-08-02 ENCOUNTER — Other Ambulatory Visit: Payer: Self-pay

## 2020-08-02 ENCOUNTER — Inpatient Hospital Stay: Payer: Medicare HMO | Attending: Gynecologic Oncology

## 2020-08-02 DIAGNOSIS — C775 Secondary and unspecified malignant neoplasm of intrapelvic lymph nodes: Secondary | ICD-10-CM

## 2020-08-02 DIAGNOSIS — R600 Localized edema: Secondary | ICD-10-CM

## 2020-08-02 DIAGNOSIS — C541 Malignant neoplasm of endometrium: Secondary | ICD-10-CM | POA: Diagnosis not present

## 2020-08-02 DIAGNOSIS — Z5111 Encounter for antineoplastic chemotherapy: Secondary | ICD-10-CM | POA: Insufficient documentation

## 2020-08-02 DIAGNOSIS — D61818 Other pancytopenia: Secondary | ICD-10-CM

## 2020-08-02 DIAGNOSIS — Z95828 Presence of other vascular implants and grafts: Secondary | ICD-10-CM

## 2020-08-02 DIAGNOSIS — C7802 Secondary malignant neoplasm of left lung: Secondary | ICD-10-CM

## 2020-08-02 DIAGNOSIS — I10 Essential (primary) hypertension: Secondary | ICD-10-CM | POA: Diagnosis not present

## 2020-08-02 LAB — CMP (CANCER CENTER ONLY)
ALT: 21 U/L (ref 0–44)
AST: 18 U/L (ref 15–41)
Albumin: 3.5 g/dL (ref 3.5–5.0)
Alkaline Phosphatase: 123 U/L (ref 38–126)
Anion gap: 13 (ref 5–15)
BUN: 20 mg/dL (ref 8–23)
CO2: 21 mmol/L — ABNORMAL LOW (ref 22–32)
Calcium: 10.2 mg/dL (ref 8.9–10.3)
Chloride: 104 mmol/L (ref 98–111)
Creatinine: 1.07 mg/dL — ABNORMAL HIGH (ref 0.44–1.00)
GFR, Est AFR Am: >60 mL/min (ref >60)
GFR, Estimated: 56 mL/min — ABNORMAL LOW (ref >60)
Glucose, Bld: 223 mg/dL — ABNORMAL HIGH (ref 70–99)
Potassium: 4.2 mmol/L (ref 3.5–5.1)
Sodium: 138 mmol/L (ref 135–145)
Total Bilirubin: 0.5 mg/dL (ref 0.3–1.2)
Total Protein: 8 g/dL (ref 6.5–8.1)

## 2020-08-02 LAB — CBC WITH DIFFERENTIAL (CANCER CENTER ONLY)
Abs Immature Granulocytes: 0.51 10*3/uL — ABNORMAL HIGH (ref 0.00–0.07)
Basophils Absolute: 0 10*3/uL (ref 0.0–0.1)
Basophils Relative: 0 %
Eosinophils Absolute: 0 10*3/uL (ref 0.0–0.5)
Eosinophils Relative: 0 %
HCT: 24 % — ABNORMAL LOW (ref 36.0–46.0)
Hemoglobin: 8.4 g/dL — ABNORMAL LOW (ref 12.0–15.0)
Immature Granulocytes: 6 %
Lymphocytes Relative: 8 %
Lymphs Abs: 0.7 10*3/uL (ref 0.7–4.0)
MCH: 35.1 pg — ABNORMAL HIGH (ref 26.0–34.0)
MCHC: 35 g/dL (ref 30.0–36.0)
MCV: 100.4 fL — ABNORMAL HIGH (ref 80.0–100.0)
Monocytes Absolute: 0.2 10*3/uL (ref 0.1–1.0)
Monocytes Relative: 2 %
Neutro Abs: 7.3 10*3/uL (ref 1.7–7.7)
Neutrophils Relative %: 84 %
Platelet Count: 119 10*3/uL — ABNORMAL LOW (ref 150–400)
RBC: 2.39 MIL/uL — ABNORMAL LOW (ref 3.87–5.11)
RDW: 21.1 % — ABNORMAL HIGH (ref 11.5–15.5)
WBC Count: 8.7 10*3/uL (ref 4.0–10.5)
nRBC: 0.5 % — ABNORMAL HIGH (ref 0.0–0.2)

## 2020-08-02 MED ORDER — DIPHENHYDRAMINE HCL 50 MG/ML IJ SOLN
INTRAMUSCULAR | Status: AC
  Filled 2020-08-02: qty 1

## 2020-08-02 MED ORDER — SODIUM CHLORIDE 0.9 % IV SOLN
150.0000 mg | Freq: Once | INTRAVENOUS | Status: AC
  Administered 2020-08-02: 150 mg via INTRAVENOUS
  Filled 2020-08-02: qty 150

## 2020-08-02 MED ORDER — SODIUM CHLORIDE 0.9 % IV SOLN
10.0000 mg | Freq: Once | INTRAVENOUS | Status: AC
  Administered 2020-08-02: 10 mg via INTRAVENOUS
  Filled 2020-08-02: qty 10

## 2020-08-02 MED ORDER — SODIUM CHLORIDE 0.9 % IV SOLN
175.0000 mg/m2 | Freq: Once | INTRAVENOUS | Status: AC
  Administered 2020-08-02: 372 mg via INTRAVENOUS
  Filled 2020-08-02: qty 62

## 2020-08-02 MED ORDER — PALONOSETRON HCL INJECTION 0.25 MG/5ML
0.2500 mg | Freq: Once | INTRAVENOUS | Status: AC
  Administered 2020-08-02: 0.25 mg via INTRAVENOUS

## 2020-08-02 MED ORDER — FAMOTIDINE IN NACL 20-0.9 MG/50ML-% IV SOLN
INTRAVENOUS | Status: AC
  Filled 2020-08-02: qty 50

## 2020-08-02 MED ORDER — SODIUM CHLORIDE 0.9 % IV SOLN
546.0000 mg | Freq: Once | INTRAVENOUS | Status: AC
  Administered 2020-08-02: 550 mg via INTRAVENOUS
  Filled 2020-08-02: qty 55

## 2020-08-02 MED ORDER — FAMOTIDINE IN NACL 20-0.9 MG/50ML-% IV SOLN
20.0000 mg | Freq: Once | INTRAVENOUS | Status: AC
  Administered 2020-08-02: 20 mg via INTRAVENOUS

## 2020-08-02 MED ORDER — SODIUM CHLORIDE 0.9 % IV SOLN
Freq: Once | INTRAVENOUS | Status: AC
  Filled 2020-08-02: qty 250

## 2020-08-02 MED ORDER — SODIUM CHLORIDE 0.9% FLUSH
10.0000 mL | INTRAVENOUS | Status: DC | PRN
  Administered 2020-08-02: 10 mL
  Filled 2020-08-02: qty 10

## 2020-08-02 MED ORDER — SODIUM CHLORIDE 0.9% FLUSH
10.0000 mL | INTRAVENOUS | Status: DC | PRN
  Administered 2020-08-02: 10 mL via INTRAVENOUS
  Filled 2020-08-02: qty 10

## 2020-08-02 MED ORDER — HEPARIN SOD (PORK) LOCK FLUSH 100 UNIT/ML IV SOLN
500.0000 [IU] | Freq: Once | INTRAVENOUS | Status: AC | PRN
  Administered 2020-08-02: 500 [IU]
  Filled 2020-08-02: qty 5

## 2020-08-02 MED ORDER — PALONOSETRON HCL INJECTION 0.25 MG/5ML
INTRAVENOUS | Status: AC
  Filled 2020-08-02: qty 5

## 2020-08-02 MED ORDER — DIPHENHYDRAMINE HCL 50 MG/ML IJ SOLN
25.0000 mg | Freq: Once | INTRAMUSCULAR | Status: AC
  Administered 2020-08-02: 25 mg via INTRAVENOUS

## 2020-08-02 NOTE — Telephone Encounter (Signed)
Scheduled appts per 8/18 sch msg. Gave pt a print out of AVS.

## 2020-08-02 NOTE — Patient Instructions (Signed)
1) please monitor BP twice a day 2) take amlodipine in the evening

## 2020-08-02 NOTE — Assessment & Plan Note (Signed)
This could be related to uncontrolled hypertension I recommend she takes amlodipine in the evening I recommend she start checking blood pressure twice a day I will call her next week to assess blood pressure control

## 2020-08-02 NOTE — Assessment & Plan Note (Signed)
She has significant pancytopenia I recommend giving her an extra week of break to allow bone marrow recovery for her next cycle

## 2020-08-02 NOTE — Assessment & Plan Note (Signed)
I have reviewed multiple imaging studies with the patient and her sister She have excellent response to therapy The plan would be to proceed with 3 more cycles of treatment before repeating CT imaging again She is in agreement to try Given her severe pancytopenia, I recommend giving her an extra week of break, to lengthen the cycle to every 4 weeks She is in agreement

## 2020-08-02 NOTE — Patient Instructions (Signed)
[facility] Discharge Instructions for Patients Receiving Chemotherapy  Today you received the following chemotherapy agents Paclitaxel (TAXOL) & Carboplatin (PARAPLATIN).  To help prevent nausea and vomiting after your treatment, we encourage you to take your nausea medication as prescribed.  If you develop nausea and vomiting that is not controlled by your nausea medication, call the clinic.   BELOW ARE SYMPTOMS THAT SHOULD BE REPORTED IMMEDIATELY:  *FEVER GREATER THAN 100.5 F  *CHILLS WITH OR WITHOUT FEVER  NAUSEA AND VOMITING THAT IS NOT CONTROLLED WITH YOUR NAUSEA MEDICATION  *UNUSUAL SHORTNESS OF BREATH  *UNUSUAL BRUISING OR BLEEDING  TENDERNESS IN MOUTH AND THROAT WITH OR WITHOUT PRESENCE OF ULCERS  *URINARY PROBLEMS  *BOWEL PROBLEMS  UNUSUAL RASH Items with * indicate a potential emergency and should be followed up as soon as possible.  Feel free to call the clinic should you have any questions or concerns. The clinic phone number is [phone].  Please show the CHEMO ALERT CARD at check-in to the Emergency Department and triage nurse.   

## 2020-08-02 NOTE — Progress Notes (Signed)
[name] OFFICE PROGRESS NOTE  Patient Care Team: [name], [name], MD as PCP - General (Family Medicine)  ASSESSMENT & PLAN:  Endometrial cancer [facility]) I have reviewed multiple imaging studies with the patient and her sister She have excellent response to therapy The plan would be to proceed with 3 more cycles of treatment before repeating CT imaging again She is in agreement to try Given her severe pancytopenia, I recommend giving her an extra week of break, to lengthen the cycle to every 4 weeks She is in agreement  Pancytopenia, acquired [facility]) She has significant pancytopenia I recommend giving her an extra week of break to allow bone marrow recovery for her next cycle  Bilateral lower extremity edema This could be related to uncontrolled hypertension I recommend she takes amlodipine in the evening I recommend she start checking blood pressure twice a day I will call her next week to assess blood pressure control  Essential hypertension Her blood pressure is poorly controlled, likely due to anxiety I recommend she checks her blood pressure daily twice a day at home and we will call her next week   No orders of the defined types were placed in this encounter.   All questions were answered. The patient knows to call the clinic with any problems, questions or concerns. The total time spent in the appointment was 25 minutes encounter with patients including review of chart and various tests results, discussions about plan of care and coordination of care plan   [name], MD 08/02/2020 11:42 AM  INTERVAL HISTORY: Please see below for problem oriented charting. She returns for chemotherapy and follow-up She is doing well with last cycle of treatment She denies abdominal pain She has persistent lower extremity edema She did not check her blood pressure at home No recent nausea constipation Denies peripheral neuropathy  SUMMARY OF ONCOLOGIC  HISTORY: Oncology History Overview Note  ER/PR neg, MSI stable   Endometrial cancer ([place])  06/15/2018 Surgery   Surgeon: Donaciano Eva     Operation: Robotic-assisted laparoscopic total hysterectomy with bilateral salpingoophorectomy, SLN biopsy     Operative Findings:  : 6cm uterus, normal tubes and ovaries, omentum adherent to the anterior abdominal wall and uterine fundus. No suspicious nodes.   06/15/2018 Pathology Results   Procedure: Total hysterectomy with bilateral salpingo-oophorectomy. Bilateral obturator sentinel lymph node biopsies. Histologic type: Endometrioid adenocarcinoma. Histologic Grade: FIGO grade 1. Myometrial invasion: Depth of invasion: 15 mm Myometrial thickness: 20 mm Uterine Serosa Involvement: Not identified. Cervical stromal involvement: Not identified. Extent of involvement of other organs: Uninvolved. Lymphovascular invasion: Present. Regional Lymph Nodes: Examined: 2 Sentinel 0 Non-sentinel 2 Total Lymph nodes with metastasis: 0 Isolated tumor cells (< 0.2 mm): 2 Micrometastasis: (> 0.2 mm and < 2.0 mm): 0 Macrometastasis: (> 2.0 mm): 0 Extracapsular extension: N/A. Tumor block for ancillary studies: 4Q-S. MMR / MSI testing: Will be ordered. Pathologic Stage Classification (pTNM, AJCC 8th edition): pT1b, pN0(i+)   07/20/2018 Imaging   1. Surgical changes from recent hysterectomy. No findings suspicious for residual tumor or adenopathy in the pelvis. 2. No CT findings to suggest omental, peritoneal surface or solid organ metastatic disease in the abdomen. 3. Changes consistent with known sarcoidosis in the chest with bulky calcified mediastinal and hilar lymph nodes and interstitial lung disease. I do not see any definite CT findings to suggest metastatic disease involving the chest.   08/19/2018 - 09/17/2018 Radiation Therapy   08/19/18 - 09/17/18: Vaginal Cuff, 3 cm cylinder  with treatment length of 3 cm / 30 Gy delivered in 5 fractions of 6 Gy,  Brachytherapy / HDR, Iridium-192   05/2005: Left Chest Wall keloid   04/2004 - 05/2004: Anus, pelvis /inguinal ( Dr. Tammi Klippel)   01/19/2020 Imaging   1. No definite CT findings of the abdomen or pelvis to explain pain. 2. The sigmoid colon and rectum are decompressed although slightly thickened appearing, suggestive of nonspecific infectious, inflammatory, or ischemic colitis. Correlate for referable clinical symptoms. 3. There is trace, nonspecific free fluid in the low pelvis, which may be reactive, although would be difficult to distinguish from postoperative and post treatment soft tissue change in the pelvis given history of hysterectomy and rectal cancer. 4. Unchanged prominent retroperitoneal and iliac lymph nodes. No new findings suspicious for metastatic disease in the abdomen or pelvis. 5. Bibasilar pulmonary fibrosis and mediastinal and hilar lymphadenopathy, better evaluated by prior CT chest and consistent with patient diagnosis of sarcoidosis.       04/18/2020 Imaging   Venous Doppler Left:  - No evidence of deep vein thrombosis seen in the left lower extremity, from the common femoral through the popliteal veins.  - No evidence of superficial venous thrombosis in the left lower extremity.  - There is no evidence of venous reflux seen in the left lower extremity.  - Rouleax flow noted in the superficial system.    05/18/2020 PET scan   1. Complex scan with a combination of hypermetabolic granulomatous disease (sarcoidosis) as well as hypermetabolic metastatic endometrial carcinoma. 2. Hypermetabolic mediastinal hilar lymph nodes and pulmonary nodularity with bronchovascular thickening is consistent with sarcoidosis. 3. Solitary hypermetabolic nodule in the medial LEFT lower lobe, new from prior with intense metabolic activity, is concerning for a metastatic deposit to the LEFT lower lobe. 4. Ill-defined hypermetabolic soft tissue along the LEFT operator space is most consistent with  local recurrence of endometrial carcinoma. There is entrapment of the LEFT ureter by this hypermetabolic soft tissue with mild hydroureter and hydronephrosis on the LEFT. 5. Additionally there appears to be direct extension from the hypermetabolic left operator space soft tissue to the sigmoid colon with there is a well-defined hypermetabolic lesion in the colon. 6. More distant hypermetabolic peritoneal metastasis along the ventral peritoneal surface of the RIGHT abdomen beneath RIGHT rectus abdominus muscle. 7. Hypermetabolic lymph nodes in the porta hepatis are favored related to sarcoidosis.   05/19/2020 Cancer Staging   Staging form: Corpus Uteri - Carcinoma and Carcinosarcoma, AJCC 8th Edition - Clinical stage from 05/19/2020: Stage IVB (rcT1, cN2a, cM1) - Signed by [name], MD on 05/19/2020   05/26/2020 Procedure   Successful placement of a right internal jugular approach power injectable Port-A-Cath. The catheter is ready for immediate use   05/29/2020 -  Chemotherapy   The patient had carboplatin and taxol for chemotherapy treatment.     07/31/2020 Imaging   1. Asymmetric soft tissue mass extending from the left vaginal cuff has decreased in size in the interval. No residual peritoneal nodularity. Pulmonary metastases are stable with exception of a minimally improved left lower lobe nodule. 2. Stable soft tissue nodule associated with the right rectus abdominus muscle. 3. Interval resolution of left hydronephrosis. 4. Calcified mediastinal and hilar lymph nodes, together with pulmonary parenchymal fibrosis, findings which are in keeping with patient's known diagnosis of sarcoid. 5. Aortic atherosclerosis (ICD10-I70.0). Coronary artery calcification.   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       REVIEW OF SYSTEMS:   Constitutional: Denies fevers, chills or abnormal weight loss Eyes: Denies blurriness of vision Ears, nose, mouth, throat, and face: Denies mucositis or sore throat Respiratory: Denies cough, dyspnea or wheezes Cardiovascular: Denies palpitation, chest discomfort  Gastrointestinal:  Denies nausea, heartburn or change in bowel habits Skin: Denies abnormal skin rashes Lymphatics: Denies new lymphadenopathy or easy bruising Neurological:Denies numbness, tingling or new weaknesses Behavioral/Psych: Mood is stable, no new changes  All other systems were reviewed with the patient and are negative.  I have reviewed the past medical history, past surgical history, social history and family history with the patient and they are unchanged from previous note.  ALLERGIES:  is allergic to shellfish allergy and penicillins.  MEDICATIONS:  Current Outpatient Medications  Medication Sig Dispense Refill  . albuterol (VENTOLIN HFA) 108 (90 Base) MCG/ACT inhaler Inhale 2 puffs into the lungs every 4 (four) hours as needed for wheezing or shortness of breath. 25.5 g 1  . amLODipine (NORVASC) 10 MG tablet Take 1 tablet (10 mg total) by mouth daily. 30 tablet 5  . bisoprolol (ZEBETA) 10 MG tablet TAKE 1 TABLET TWICE DAILY 180 tablet 1  . budesonide-formoterol (SYMBICORT) 80-4.5 MCG/ACT inhaler Inhale 2 puffs into the lungs 2 (two) times daily. 3 Inhaler 3  . desonide (DESOWEN) 0.05 % lotion Apply topically 2 (two) times daily. Continue following with dermatologist 59 mL 0  . dexamethasone (DECADRON) 4 MG tablet Take 2 tabs at the night before and 2 tabs the morning of chemotherapy, every 3 weeks, by mouth 36 tablet 6  .  dextromethorphan (DELSYM) 30 MG/5ML liquid Take by mouth as needed for cough.     . hydrochlorothiazide (HYDRODIURIL) 25 MG tablet TAKE 1 TABLET EVERY DAY 90 tablet 3  . hydrocortisone 2.5 % cream Apply 1 application topically 2 (two) times daily.    Marland Kitchen lidocaine-prilocaine (EMLA) cream Apply to affected area once 30 g 3  . linaclotide (LINZESS) 145 MCG CAPS capsule Take 1 capsule (145 mcg total) by mouth daily before breakfast. 30 capsule 1  . loratadine (CLARITIN) 10 MG tablet Take 10 mg by mouth daily as needed for allergies.    Marland Kitchen morphine (MSIR) 15 MG tablet Take 1 tablet (15 mg total) by mouth every 4 (four) hours as needed for severe pain. 30 tablet 0  . ondansetron (ZOFRAN) 8 MG tablet Take 1 tablet (8 mg total) by mouth every 8 (eight) hours as needed for nausea. 30 tablet 3  . predniSONE (DELTASONE) 10 MG tablet Take 0.5 tablets (5 mg total) by mouth daily with breakfast. 90 tablet 3  . prochlorperazine (COMPAZINE) 10 MG tablet Take 1 tablet (10 mg total) by mouth every 6 (six) hours as needed for nausea or vomiting. 30 tablet 3  . RABEprazole (ACIPHEX) 20 MG tablet Take 1 tablet (20 mg total) by mouth daily before breakfast. 90 tablet 3  . simvastatin (ZOCOR) 20 MG tablet Take 1 tablet (20 mg total) by mouth at bedtime. 90 tablet 3  . vitamin C (ASCORBIC ACID) 500 MG tablet Take 500 mg by mouth daily.  No current facility-administered medications for this visit.   Facility-Administered Medications Ordered in Other Visits  Medication Dose Route Frequency Provider Last Rate Last Admin  . CARBOplatin (PARAPLATIN) 550 mg in sodium chloride 0.9 % 250 mL chemo infusion  550 mg Intravenous Once Alvy Bimler, Devarion Mcclanahan, MD      . dexamethasone (DECADRON) 10 mg in sodium chloride 0.9 % 50 mL IVPB  10 mg Intravenous Once Alvy Bimler, Melvin Marmo, MD      . diphenhydrAMINE (BENADRYL) injection 25 mg  25 mg Intravenous Once Alvy Bimler, Ryland Smoots, MD      . famotidine (PEPCID) IVPB 20 mg premix  20 mg Intravenous Once Alvy Bimler, Lititia Sen,  MD      . fosaprepitant (EMEND) 150 mg in sodium chloride 0.9 % 145 mL IVPB  150 mg Intravenous Once Lerae Langham, MD      . heparin lock flush 100 unit/mL  500 Units Intracatheter Once PRN Alvy Bimler, Babe Anthis, MD      . PACLitaxel (TAXOL) 372 mg in sodium chloride 0.9 % 500 mL chemo infusion (> $RemoveBef'80mg'EuUYTtQOsu$ /m2)  175 mg/m2 (Treatment Plan Recorded) Intravenous Once Alvy Bimler, Payten Hobin, MD      . palonosetron (ALOXI) injection 0.25 mg  0.25 mg Intravenous Once Neilan Rizzo, MD      . sodium chloride flush (NS) 0.9 % injection 10 mL  10 mL Intracatheter PRN Alvy Bimler, Abi Shoults, MD        PHYSICAL EXAMINATION: ECOG PERFORMANCE STATUS: 1 - Symptomatic but completely ambulatory  Vitals:   08/02/20 1054  BP: (!) 189/85  Pulse: 85  Resp: 18  Temp: 98.1 F (36.7 C)  SpO2: 95%   Filed Weights   08/02/20 1054  Weight: 211 lb 4 oz (95.8 kg)    GENERAL:alert, no distress and comfortable HEART: noted bilateral lower extremity edema NEURO: alert & oriented x 3 with fluent speech, no focal motor/sensory deficits  LABORATORY DATA:  I have reviewed the data as listed    Component Value Date/Time   NA 138 08/02/2020 1007   K 4.2 08/02/2020 1007   CL 104 08/02/2020 1007   CO2 21 (L) 08/02/2020 1007   GLUCOSE 223 (H) 08/02/2020 1007   BUN 20 08/02/2020 1007   CREATININE 1.07 (H) 08/02/2020 1007   CALCIUM 10.2 08/02/2020 1007   PROT 8.0 08/02/2020 1007   ALBUMIN 3.5 08/02/2020 1007   AST 18 08/02/2020 1007   ALT 21 08/02/2020 1007   ALKPHOS 123 08/02/2020 1007   BILITOT 0.5 08/02/2020 1007   GFRNONAA 56 (L) 08/02/2020 1007   GFRAA >60 08/02/2020 1007    No results found for: SPEP, UPEP  Lab Results  Component Value Date   WBC 8.7 08/02/2020   NEUTROABS 7.3 08/02/2020   HGB 8.4 (L) 08/02/2020   HCT 24.0 (L) 08/02/2020   MCV 100.4 (H) 08/02/2020   PLT 119 (L) 08/02/2020      Chemistry      Component Value Date/Time   NA 138 08/02/2020 1007   K 4.2 08/02/2020 1007   CL 104 08/02/2020 1007   CO2 21 (L)  08/02/2020 1007   BUN 20 08/02/2020 1007   CREATININE 1.07 (H) 08/02/2020 1007      Component Value Date/Time   CALCIUM 10.2 08/02/2020 1007   ALKPHOS 123 08/02/2020 1007   AST 18 08/02/2020 1007   ALT 21 08/02/2020 1007   BILITOT 0.5 08/02/2020 1007       RADIOGRAPHIC STUDIES: I have reviewed multiple CT imaging with the patient I have personally reviewed the  radiological images as listed and agreed with the findings in the report. CT CHEST W CONTRAST  Result Date: 07/31/2020 CLINICAL DATA:  Uterine/cervical cancer, assess treatment response. Ongoing chemotherapy. History of rectal cancer treated with surgery, chemotherapy and radiation therapy. Weight loss. EXAM: CT CHEST, ABDOMEN, AND PELVIS WITH CONTRAST TECHNIQUE: Multidetector CT imaging of the chest, abdomen and pelvis was performed following the standard protocol during bolus administration of intravenous contrast. CONTRAST:  175m OMNIPAQUE IOHEXOL 300 MG/ML  SOLN COMPARISON:  05/18/2020 FINDINGS: CT CHEST FINDINGS Cardiovascular: Right IJ Port-A-Cath terminates in the right atrium. Atherosclerotic calcification of the aorta, aortic valve and coronary arteries. Heart is enlarged. No pericardial effusion. Mediastinum/Nodes: There are calcified mediastinal and hilar lymph nodes measuring up to 2.6 cm in the low right paratracheal station, unchanged. Partially calcified subcarinal lymph node measures 2.3 cm, also stable. No axillary adenopathy. Esophagus is grossly unremarkable. Lungs/Pleura: Basilar predominant traction bronchiectasis/bronchiolectasis with ground-glass, subpleural reticulation and parenchymal coarsening. 5 mm peripheral left lower lobe nodule (6/99) has not changed in size but appears less dense than on 05/18/2020. Otherwise, pulmonary nodules measure up to 6 mm in the peripheral right lower lobe (6/106), stable. No pleural fluid. Airway is unremarkable. Musculoskeletal: Degenerative changes in the spine. No worrisome lytic  or sclerotic lesions. CT ABDOMEN PELVIS FINDINGS Hepatobiliary: Liver is slightly enlarged, measuring 19.8 cm. Cholecystectomy. No biliary ductal dilatation. Pancreas: Negative. Spleen: Negative. Adrenals/Urinary Tract: Adrenal glands and right kidney are unremarkable. Subcentimeter low-attenuation lesion in the left kidney is too small to characterize but statistically, a cyst is likely. Ureters are decompressed. Bladder is low in volume. Stomach/Bowel: Small hiatal hernia. Stomach, small bowel, appendix and colon are otherwise unremarkable. Vascular/Lymphatic: Atherosclerotic calcification of the aorta without aneurysm. Retroperitoneal lymph nodes are not enlarged by CT size criteria. Periportal lymph nodes measure up to 12 mm, unchanged. Reproductive: Hysterectomy. Asymmetric soft tissue extending from the left vaginal cuff, into the left adnexa, has decreased in size, now measuring 2.1 x 2.6 cm (2/105), compared to 3.7 x 4.2 cm on 05/18/2020. No right adnexal mass. Other: Peritoneal nodules seen on 05/18/2020 have resolved in the interval. Tiny right inguinal hernia contains fat. No free fluid. Mesenteries and peritoneum are otherwise unremarkable. Musculoskeletal: Minimally hyperattenuating soft tissue nodule along the posterior margin of the right rectus abdominus muscle is stable in size, measuring 1.0 x 1.7 cm (2/84). Postoperative changes in the lateral right iliac wing. No worrisome lytic or sclerotic lesions. IMPRESSION: 1. Asymmetric soft tissue mass extending from the left vaginal cuff has decreased in size in the interval. No residual peritoneal nodularity. Pulmonary metastases are stable with exception of a minimally improved left lower lobe nodule. 2. Stable soft tissue nodule associated with the right rectus abdominus muscle. 3. Interval resolution of left hydronephrosis. 4. Calcified mediastinal and hilar lymph nodes, together with pulmonary parenchymal fibrosis, findings which are in keeping with  patient's known diagnosis of sarcoid. 5. Aortic atherosclerosis (ICD10-I70.0). Coronary artery calcification. Electronically Signed   By: MLorin PicketM.D.   On: 07/31/2020 08:54   CT ABDOMEN PELVIS W CONTRAST  Result Date: 07/31/2020 CLINICAL DATA:  Uterine/cervical cancer, assess treatment response. Ongoing chemotherapy. History of rectal cancer treated with surgery, chemotherapy and radiation therapy. Weight loss. EXAM: CT CHEST, ABDOMEN, AND PELVIS WITH CONTRAST TECHNIQUE: Multidetector CT imaging of the chest, abdomen and pelvis was performed following the standard protocol during bolus administration of intravenous contrast. CONTRAST:  1051mOMNIPAQUE IOHEXOL 300 MG/ML  SOLN COMPARISON:  05/18/2020 FINDINGS: CT CHEST FINDINGS Cardiovascular:  Right IJ Port-A-Cath terminates in the right atrium. Atherosclerotic calcification of the aorta, aortic valve and coronary arteries. Heart is enlarged. No pericardial effusion. Mediastinum/Nodes: There are calcified mediastinal and hilar lymph nodes measuring up to 2.6 cm in the low right paratracheal station, unchanged. Partially calcified subcarinal lymph node measures 2.3 cm, also stable. No axillary adenopathy. Esophagus is grossly unremarkable. Lungs/Pleura: Basilar predominant traction bronchiectasis/bronchiolectasis with ground-glass, subpleural reticulation and parenchymal coarsening. 5 mm peripheral left lower lobe nodule (6/99) has not changed in size but appears less dense than on 05/18/2020. Otherwise, pulmonary nodules measure up to 6 mm in the peripheral right lower lobe (6/106), stable. No pleural fluid. Airway is unremarkable. Musculoskeletal: Degenerative changes in the spine. No worrisome lytic or sclerotic lesions. CT ABDOMEN PELVIS FINDINGS Hepatobiliary: Liver is slightly enlarged, measuring 19.8 cm. Cholecystectomy. No biliary ductal dilatation. Pancreas: Negative. Spleen: Negative. Adrenals/Urinary Tract: Adrenal glands and right kidney are  unremarkable. Subcentimeter low-attenuation lesion in the left kidney is too small to characterize but statistically, a cyst is likely. Ureters are decompressed. Bladder is low in volume. Stomach/Bowel: Small hiatal hernia. Stomach, small bowel, appendix and colon are otherwise unremarkable. Vascular/Lymphatic: Atherosclerotic calcification of the aorta without aneurysm. Retroperitoneal lymph nodes are not enlarged by CT size criteria. Periportal lymph nodes measure up to 12 mm, unchanged. Reproductive: Hysterectomy. Asymmetric soft tissue extending from the left vaginal cuff, into the left adnexa, has decreased in size, now measuring 2.1 x 2.6 cm (2/105), compared to 3.7 x 4.2 cm on 05/18/2020. No right adnexal mass. Other: Peritoneal nodules seen on 05/18/2020 have resolved in the interval. Tiny right inguinal hernia contains fat. No free fluid. Mesenteries and peritoneum are otherwise unremarkable. Musculoskeletal: Minimally hyperattenuating soft tissue nodule along the posterior margin of the right rectus abdominus muscle is stable in size, measuring 1.0 x 1.7 cm (2/84). Postoperative changes in the lateral right iliac wing. No worrisome lytic or sclerotic lesions. IMPRESSION: 1. Asymmetric soft tissue mass extending from the left vaginal cuff has decreased in size in the interval. No residual peritoneal nodularity. Pulmonary metastases are stable with exception of a minimally improved left lower lobe nodule. 2. Stable soft tissue nodule associated with the right rectus abdominus muscle. 3. Interval resolution of left hydronephrosis. 4. Calcified mediastinal and hilar lymph nodes, together with pulmonary parenchymal fibrosis, findings which are in keeping with patient's known diagnosis of sarcoid. 5. Aortic atherosclerosis (ICD10-I70.0). Coronary artery calcification. Electronically Signed   By: Lorin Picket M.D.   On: 07/31/2020 08:54

## 2020-08-02 NOTE — Patient Instructions (Signed)

## 2020-08-02 NOTE — Assessment & Plan Note (Signed)
Her blood pressure is poorly controlled, likely due to anxiety I recommend she checks her blood pressure daily twice a day at home and we will call her next week

## 2020-08-07 ENCOUNTER — Telehealth: Payer: Self-pay

## 2020-08-07 NOTE — Telephone Encounter (Signed)
TC to patient, patient given parameters for BP.  Patient verbalized understanding of all information provided.

## 2020-08-07 NOTE — Telephone Encounter (Signed)
Much better Call me or PCP if SBP >150 or DBP>100

## 2020-08-07 NOTE — Telephone Encounter (Signed)
TC to patient.  BP readings as follows: Thurs 08/03/20              PM 185/96 Fri 08/04/20   AM 168/89 PM 178/94 Sat 08/05/20  AM 137/79 PM 140/82 Sun 08/06/20 AM 145/80 PM 143/85

## 2020-08-07 NOTE — Telephone Encounter (Signed)
-----   Message from Flo Shanks, RN sent at 08/07/2020  9:45 AM EDT ----- Regarding: FW: can you check on her BP?  ----- Message ----- From: [name], MD Sent: 08/07/2020   8:14 AM EDT To: Flo Shanks, RN Subject: can you check on her BP?

## 2020-08-14 ENCOUNTER — Ambulatory Visit: Payer: Self-pay | Admitting: Radiation Oncology

## 2020-08-15 ENCOUNTER — Telehealth: Payer: Self-pay | Admitting: Family Medicine

## 2020-08-15 NOTE — Progress Notes (Signed)
  Chronic Care Management   Outreach Note  08/15/2020 Name: [name] MRN: [id number] DOB: 1956-09-29  Referred by: [name], [name], MD Reason for referral : No chief complaint on file.   An unsuccessful telephone outreach was attempted today. The patient was referred to the pharmacist for assistance with care management and care coordination.   Follow Up Plan:   Carley Perdue [employer] Scheduler

## 2020-08-22 MED FILL — DEXAMETHASONE 4 MG TABLET: 4 | 84 days supply | Qty: 16 | Fill #1

## 2020-08-30 ENCOUNTER — Inpatient Hospital Stay: Payer: Medicare HMO

## 2020-08-30 ENCOUNTER — Other Ambulatory Visit: Payer: Self-pay

## 2020-08-30 ENCOUNTER — Telehealth: Payer: Self-pay | Admitting: Family Medicine

## 2020-08-30 ENCOUNTER — Inpatient Hospital Stay: Payer: Medicare HMO | Admitting: Hematology and Oncology

## 2020-08-30 ENCOUNTER — Inpatient Hospital Stay: Payer: Medicare HMO | Attending: Gynecologic Oncology

## 2020-08-30 ENCOUNTER — Encounter: Payer: Self-pay | Admitting: Hematology and Oncology

## 2020-08-30 DIAGNOSIS — Z23 Encounter for immunization: Secondary | ICD-10-CM | POA: Diagnosis not present

## 2020-08-30 DIAGNOSIS — D539 Nutritional anemia, unspecified: Secondary | ICD-10-CM

## 2020-08-30 DIAGNOSIS — T451X5A Adverse effect of antineoplastic and immunosuppressive drugs, initial encounter: Secondary | ICD-10-CM

## 2020-08-30 DIAGNOSIS — C775 Secondary and unspecified malignant neoplasm of intrapelvic lymph nodes: Secondary | ICD-10-CM

## 2020-08-30 DIAGNOSIS — D61818 Other pancytopenia: Secondary | ICD-10-CM | POA: Insufficient documentation

## 2020-08-30 DIAGNOSIS — R600 Localized edema: Secondary | ICD-10-CM

## 2020-08-30 DIAGNOSIS — I10 Essential (primary) hypertension: Secondary | ICD-10-CM | POA: Diagnosis not present

## 2020-08-30 DIAGNOSIS — C541 Malignant neoplasm of endometrium: Secondary | ICD-10-CM

## 2020-08-30 DIAGNOSIS — Z5111 Encounter for antineoplastic chemotherapy: Secondary | ICD-10-CM | POA: Diagnosis not present

## 2020-08-30 DIAGNOSIS — G620 Drug-induced polyneuropathy: Secondary | ICD-10-CM | POA: Insufficient documentation

## 2020-08-30 DIAGNOSIS — C7802 Secondary malignant neoplasm of left lung: Secondary | ICD-10-CM

## 2020-08-30 LAB — CMP (CANCER CENTER ONLY)
ALT: 24 U/L (ref 0–44)
AST: 21 U/L (ref 15–41)
Albumin: 3.5 g/dL (ref 3.5–5.0)
Alkaline Phosphatase: 130 U/L — ABNORMAL HIGH (ref 38–126)
Anion gap: 10 (ref 5–15)
BUN: 18 mg/dL (ref 8–23)
CO2: 25 mmol/L (ref 22–32)
Calcium: 9.5 mg/dL (ref 8.9–10.3)
Chloride: 104 mmol/L (ref 98–111)
Creatinine: 0.84 mg/dL (ref 0.44–1.00)
GFR, Est AFR Am: >60 mL/min (ref >60)
GFR, Estimated: >60 mL/min (ref >60)
Glucose, Bld: 148 mg/dL — ABNORMAL HIGH (ref 70–99)
Potassium: 4.3 mmol/L (ref 3.5–5.1)
Sodium: 139 mmol/L (ref 135–145)
Total Bilirubin: 0.5 mg/dL (ref 0.3–1.2)
Total Protein: 8.1 g/dL (ref 6.5–8.1)

## 2020-08-30 LAB — CBC WITH DIFFERENTIAL (CANCER CENTER ONLY)
Abs Immature Granulocytes: 0.03 10*3/uL (ref 0.00–0.07)
Basophils Absolute: 0 10*3/uL (ref 0.0–0.1)
Basophils Relative: 0 %
Eosinophils Absolute: 0.1 10*3/uL (ref 0.0–0.5)
Eosinophils Relative: 1 %
HCT: 23.5 % — ABNORMAL LOW (ref 36.0–46.0)
Hemoglobin: 8 g/dL — ABNORMAL LOW (ref 12.0–15.0)
Immature Granulocytes: 1 %
Lymphocytes Relative: 18 %
Lymphs Abs: 1.2 10*3/uL (ref 0.7–4.0)
MCH: 36.7 pg — ABNORMAL HIGH (ref 26.0–34.0)
MCHC: 34 g/dL (ref 30.0–36.0)
MCV: 107.8 fL — ABNORMAL HIGH (ref 80.0–100.0)
Monocytes Absolute: 0.7 10*3/uL (ref 0.1–1.0)
Monocytes Relative: 10 %
Neutro Abs: 4.6 10*3/uL (ref 1.7–7.7)
Neutrophils Relative %: 70 %
Platelet Count: 160 10*3/uL (ref 150–400)
RBC: 2.18 MIL/uL — ABNORMAL LOW (ref 3.87–5.11)
RDW: 18.4 % — ABNORMAL HIGH (ref 11.5–15.5)
WBC Count: 6.5 10*3/uL (ref 4.0–10.5)
nRBC: 0 % (ref 0.0–0.2)

## 2020-08-30 LAB — RETICULOCYTES
Immature Retic Fract: 33.5 % — ABNORMAL HIGH (ref 2.3–15.9)
RBC.: 2.19 MIL/uL — ABNORMAL LOW (ref 3.87–5.11)
Retic Count, Absolute: 151.8 10*3/uL (ref 19.0–186.0)
Retic Ct Pct: 6.9 % — ABNORMAL HIGH (ref 0.4–3.1)

## 2020-08-30 LAB — FERRITIN: Ferritin: 253 ng/mL (ref 11–307)

## 2020-08-30 LAB — IRON AND TIBC
Iron: 92 ug/dL (ref 41–142)
Saturation Ratios: 26 % (ref 21–57)
TIBC: 354 ug/dL (ref 236–444)
UIBC: 262 ug/dL (ref 120–384)

## 2020-08-30 LAB — VITAMIN B12: Vitamin B-12: 377 pg/mL (ref 180–914)

## 2020-08-30 MED ORDER — FAMOTIDINE IN NACL 20-0.9 MG/50ML-% IV SOLN
INTRAVENOUS | Status: AC
  Filled 2020-08-30: qty 50

## 2020-08-30 MED ORDER — DIPHENHYDRAMINE HCL 50 MG/ML IJ SOLN
25.0000 mg | Freq: Once | INTRAMUSCULAR | Status: AC
  Administered 2020-08-30: 25 mg via INTRAVENOUS

## 2020-08-30 MED ORDER — PALONOSETRON HCL INJECTION 0.25 MG/5ML
0.2500 mg | Freq: Once | INTRAVENOUS | Status: AC
  Administered 2020-08-30: 0.25 mg via INTRAVENOUS

## 2020-08-30 MED ORDER — SODIUM CHLORIDE 0.9 % IV SOLN
10.0000 mg | Freq: Once | INTRAVENOUS | Status: AC
  Administered 2020-08-30: 10 mg via INTRAVENOUS
  Filled 2020-08-30: qty 10

## 2020-08-30 MED ORDER — SODIUM CHLORIDE 0.9 % IV SOLN
131.2500 mg/m2 | Freq: Once | INTRAVENOUS | Status: AC
  Administered 2020-08-30: 276 mg via INTRAVENOUS
  Filled 2020-08-30: qty 46

## 2020-08-30 MED ORDER — DIPHENHYDRAMINE HCL 50 MG/ML IJ SOLN
INTRAMUSCULAR | Status: AC
  Filled 2020-08-30: qty 1

## 2020-08-30 MED ORDER — PALONOSETRON HCL INJECTION 0.25 MG/5ML
INTRAVENOUS | Status: AC
  Filled 2020-08-30: qty 5

## 2020-08-30 MED ORDER — HEPARIN SOD (PORK) LOCK FLUSH 100 UNIT/ML IV SOLN
500.0000 [IU] | Freq: Once | INTRAVENOUS | Status: AC | PRN
  Administered 2020-08-30: 500 [IU]
  Filled 2020-08-30: qty 5

## 2020-08-30 MED ORDER — SODIUM CHLORIDE 0.9% FLUSH
10.0000 mL | INTRAVENOUS | Status: DC | PRN
  Administered 2020-08-30: 10 mL
  Filled 2020-08-30: qty 10

## 2020-08-30 MED ORDER — SODIUM CHLORIDE 0.9 % IV SOLN
150.0000 mg | Freq: Once | INTRAVENOUS | Status: AC
  Administered 2020-08-30: 150 mg via INTRAVENOUS
  Filled 2020-08-30: qty 150

## 2020-08-30 MED ORDER — FAMOTIDINE IN NACL 20-0.9 MG/50ML-% IV SOLN
20.0000 mg | Freq: Once | INTRAVENOUS | Status: AC
  Administered 2020-08-30: 20 mg via INTRAVENOUS

## 2020-08-30 MED ORDER — SODIUM CHLORIDE 0.9 % IV SOLN
Freq: Once | INTRAVENOUS | Status: AC
  Filled 2020-08-30: qty 250

## 2020-08-30 MED ORDER — SODIUM CHLORIDE 0.9 % IV SOLN
550.0000 mg | Freq: Once | INTRAVENOUS | Status: AC
  Administered 2020-08-30: 550 mg via INTRAVENOUS
  Filled 2020-08-30: qty 55

## 2020-08-30 NOTE — Assessment & Plan Note (Signed)
she has mild peripheral neuropathy, likely related to side effects of treatment. °I plan to reduce the dose of treatment as outlined above.  °I explained to the patient the rationale of this strategy and reassured the patient it would not compromise the efficacy of treatment ° °

## 2020-08-30 NOTE — Assessment & Plan Note (Signed)
This is stable/improved Observe for now

## 2020-08-30 NOTE — Progress Notes (Signed)
  Chronic Care Management   Outreach Note  08/30/2020 Name: [name] MRN: [id number] DOB: 1956/03/10  Referred by: [name], [name], MD Reason for referral : No chief complaint on file.   A second unsuccessful telephone outreach was attempted today. The patient was referred to pharmacist for assistance with care management and care coordination.  Follow Up Plan:   Carley Perdue [employer] Scheduler

## 2020-08-30 NOTE — Assessment & Plan Note (Signed)
Her blood pressure is poorly controlled, likely due to anxiety I recommend she checks her blood pressure daily twice a day at home According to the patient, her blood pressure monitoring at home is satisfactory and within normal limits

## 2020-08-30 NOTE — Assessment & Plan Note (Signed)
She has severe pancytopenia despite multiple dose adjustment I have ordered iron studies and B12 and they were adequate I plan to keep carboplatin dose at about the same level as before and further reduce the dose of Taxol due to peripheral neuropathy She will return next week to have her hemoglobin rechecked and if her hemoglobin is less than 8, we will proceed with 1 unit of blood transfusion The risk, benefits, side effects of blood transfusion were discussed and she is in agreement with the plan of care

## 2020-08-30 NOTE — Patient Instructions (Signed)
[facility] Discharge Instructions for Patients Receiving Chemotherapy  Today you received the following chemotherapy agents Paclitaxel (TAXOL) & Carboplatin (PARAPLATIN).  To help prevent nausea and vomiting after your treatment, we encourage you to take your nausea medication as prescribed.  If you develop nausea and vomiting that is not controlled by your nausea medication, call the clinic.   BELOW ARE SYMPTOMS THAT SHOULD BE REPORTED IMMEDIATELY:  *FEVER GREATER THAN 100.5 F  *CHILLS WITH OR WITHOUT FEVER  NAUSEA AND VOMITING THAT IS NOT CONTROLLED WITH YOUR NAUSEA MEDICATION  *UNUSUAL SHORTNESS OF BREATH  *UNUSUAL BRUISING OR BLEEDING  TENDERNESS IN MOUTH AND THROAT WITH OR WITHOUT PRESENCE OF ULCERS  *URINARY PROBLEMS  *BOWEL PROBLEMS  UNUSUAL RASH Items with * indicate a potential emergency and should be followed up as soon as possible.  Feel free to call the clinic should you have any questions or concerns. The clinic phone number is [phone].  Please show the CHEMO ALERT CARD at check-in to the Emergency Department and triage nurse.   

## 2020-08-30 NOTE — Progress Notes (Signed)
MD Alvy Bimler would like to keep Carboplatin dose @ 550 mg due to patient being anemic.  Larene Beach, PharmD

## 2020-08-30 NOTE — Patient Instructions (Signed)

## 2020-08-30 NOTE — Progress Notes (Signed)
[place] OFFICE PROGRESS NOTE  Patient Care Team: [name], [name], MD as PCP - General (Family Medicine)  ASSESSMENT & PLAN:  Endometrial cancer ([place]) Overall, she tolerated treatment poorly due to severe pancytopenia Despite further dose adjustments and delaying treatment by 1 week, she is still anemic She is mildly symptomatic We will proceed with treatment with further dose adjustment I will continue to lengthen her cycle by 1 week and will see her back in 4 weeks for cycle 6 of treatment Due to high risk for transfusion, I will bring her back in approximately 10 days and recheck her blood and transfuse as needed  Bilateral lower extremity edema This is stable/improved Observe for now  Pancytopenia, acquired [facility]) She has severe pancytopenia despite multiple dose adjustment I have ordered iron studies and B12 and they were adequate I plan to keep carboplatin dose at about the same level as before and further reduce the dose of Taxol due to peripheral neuropathy She will return next week to have her hemoglobin rechecked and if her hemoglobin is less than 8, we will proceed with 1 unit of blood transfusion The risk, benefits, side effects of blood transfusion were discussed and she is in agreement with the plan of care  Peripheral neuropathy due to chemotherapy [facility]) she has mild peripheral neuropathy, likely related to side effects of treatment. I plan to reduce the dose of treatment as outlined above.  I explained to the patient the rationale of this strategy and reassured the patient it would not compromise the efficacy of treatment   Essential hypertension Her blood pressure is poorly controlled, likely due to anxiety I recommend she checks her blood pressure daily twice a day at home According to the patient, her blood pressure monitoring at home is satisfactory and within normal limits   Orders Placed This Encounter  Procedures  . Iron and TIBC     Standing Status:   Future    Number of Occurrences:   1    Standing Expiration Date:   08/30/2021  . Vitamin B12    Standing Status:   Future    Number of Occurrences:   1    Standing Expiration Date:   08/30/2021  . Ferritin    Standing Status:   Future    Number of Occurrences:   1    Standing Expiration Date:   08/30/2021  . Reticulocytes  . Sample to Blood Bank    Standing Status:   Standing    Number of Occurrences:   33    Standing Expiration Date:   08/30/2021    All questions were answered. The patient knows to call the clinic with any problems, questions or concerns. The total time spent in the appointment was 40 minutes encounter with patients including review of chart and various tests results, discussions about plan of care and coordination of care plan   [name], MD 08/30/2020 2:05 PM  INTERVAL HISTORY: Please see below for problem oriented charting. She is seen prior to cycle 5 of chemotherapy She complained of peripheral neuropathy in the hands and feet The patient denies any recent signs or symptoms of bleeding such as spontaneous epistaxis, hematuria or hematochezia. She complained of excessive fatigue but denies chest pain or shortness of breath on exertion Her blood pressure at home is within normal limits She denies recent nausea or constipation No recent infection, fever or chills  SUMMARY OF ONCOLOGIC HISTORY: Oncology History Overview Note  ER/PR neg, MSI  stable   Endometrial cancer ([place])  06/15/2018 Surgery   Surgeon: Donaciano Eva     Operation: Robotic-assisted laparoscopic total hysterectomy with bilateral salpingoophorectomy, SLN biopsy     Operative Findings:  : 6cm uterus, normal tubes and ovaries, omentum adherent to the anterior abdominal wall and uterine fundus. No suspicious nodes.   06/15/2018 Pathology Results   Procedure: Total hysterectomy with bilateral salpingo-oophorectomy. Bilateral obturator sentinel lymph  node biopsies. Histologic type: Endometrioid adenocarcinoma. Histologic Grade: FIGO grade 1. Myometrial invasion: Depth of invasion: 15 mm Myometrial thickness: 20 mm Uterine Serosa Involvement: Not identified. Cervical stromal involvement: Not identified. Extent of involvement of other organs: Uninvolved. Lymphovascular invasion: Present. Regional Lymph Nodes: Examined: 2 Sentinel 0 Non-sentinel 2 Total Lymph nodes with metastasis: 0 Isolated tumor cells (< 0.2 mm): 2 Micrometastasis: (> 0.2 mm and < 2.0 mm): 0 Macrometastasis: (> 2.0 mm): 0 Extracapsular extension: N/A. Tumor block for ancillary studies: 4Q-S. MMR / MSI testing: Will be ordered. Pathologic Stage Classification (pTNM, AJCC 8th edition): pT1b, pN0(i+)   07/20/2018 Imaging   1. Surgical changes from recent hysterectomy. No findings suspicious for residual tumor or adenopathy in the pelvis. 2. No CT findings to suggest omental, peritoneal surface or solid organ metastatic disease in the abdomen. 3. Changes consistent with known sarcoidosis in the chest with bulky calcified mediastinal and hilar lymph nodes and interstitial lung disease. I do not see any definite CT findings to suggest metastatic disease involving the chest.   08/19/2018 - 09/17/2018 Radiation Therapy   08/19/18 - 09/17/18: Vaginal Cuff, 3 cm cylinder with treatment length of 3 cm / 30 Gy delivered in 5 fractions of 6 Gy, Brachytherapy / HDR, Iridium-192   05/2005: Left Chest Wall keloid   04/2004 - 05/2004: Anus, pelvis /inguinal ( Dr. Tammi Klippel)   01/19/2020 Imaging   1. No definite CT findings of the abdomen or pelvis to explain pain. 2. The sigmoid colon and rectum are decompressed although slightly thickened appearing, suggestive of nonspecific infectious, inflammatory, or ischemic colitis. Correlate for referable clinical symptoms. 3. There is trace, nonspecific free fluid in the low pelvis, which may be reactive, although would be difficult to distinguish  from postoperative and post treatment soft tissue change in the pelvis given history of hysterectomy and rectal cancer. 4. Unchanged prominent retroperitoneal and iliac lymph nodes. No new findings suspicious for metastatic disease in the abdomen or pelvis. 5. Bibasilar pulmonary fibrosis and mediastinal and hilar lymphadenopathy, better evaluated by prior CT chest and consistent with patient diagnosis of sarcoidosis.       04/18/2020 Imaging   Venous Doppler Left:  - No evidence of deep vein thrombosis seen in the left lower extremity, from the common femoral through the popliteal veins.  - No evidence of superficial venous thrombosis in the left lower extremity.  - There is no evidence of venous reflux seen in the left lower extremity.  - Rouleax flow noted in the superficial system.    05/18/2020 PET scan   1. Complex scan with a combination of hypermetabolic granulomatous disease (sarcoidosis) as well as hypermetabolic metastatic endometrial carcinoma. 2. Hypermetabolic mediastinal hilar lymph nodes and pulmonary nodularity with bronchovascular thickening is consistent with sarcoidosis. 3. Solitary hypermetabolic nodule in the medial LEFT lower lobe, new from prior with intense metabolic activity, is concerning for a metastatic deposit to the LEFT lower lobe. 4. Ill-defined hypermetabolic soft tissue along the LEFT operator space is most consistent with local recurrence of endometrial carcinoma. There is entrapment of the  LEFT ureter by this hypermetabolic soft tissue with mild hydroureter and hydronephrosis on the LEFT. 5. Additionally there appears to be direct extension from the hypermetabolic left operator space soft tissue to the sigmoid colon with there is a well-defined hypermetabolic lesion in the colon. 6. More distant hypermetabolic peritoneal metastasis along the ventral peritoneal surface of the RIGHT abdomen beneath RIGHT rectus abdominus muscle. 7. Hypermetabolic lymph nodes in the  porta hepatis are favored related to sarcoidosis.   05/19/2020 Cancer Staging   Staging form: Corpus Uteri - Carcinoma and Carcinosarcoma, AJCC 8th Edition - Clinical stage from 05/19/2020: Stage IVB (rcT1, cN2a, cM1) - Signed by [name], MD on 05/19/2020   05/26/2020 Procedure   Successful placement of a right internal jugular approach power injectable Port-A-Cath. The catheter is ready for immediate use   05/29/2020 -  Chemotherapy   The patient had carboplatin and taxol for chemotherapy treatment.     07/31/2020 Imaging   1. Asymmetric soft tissue mass extending from the left vaginal cuff has decreased in size in the interval. No residual peritoneal nodularity. Pulmonary metastases are stable with exception of a minimally improved left lower lobe nodule. 2. Stable soft tissue nodule associated with the right rectus abdominus muscle. 3. Interval resolution of left hydronephrosis. 4. Calcified mediastinal and hilar lymph nodes, together with pulmonary parenchymal fibrosis, findings which are in keeping with patient's known diagnosis of sarcoid. 5. Aortic atherosclerosis (ICD10-I70.0). Coronary artery calcification.   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       REVIEW OF SYSTEMS:   Constitutional: Denies fevers, chills or abnormal weight loss Eyes: Denies blurriness of vision Ears, nose, mouth, throat, and face: Denies mucositis or sore throat Respiratory: Denies cough, dyspnea or wheezes Cardiovascular: Denies palpitation, chest discomfort or lower  extremity swelling Gastrointestinal:  Denies nausea, heartburn or change in bowel habits Skin: Denies abnormal skin rashes Lymphatics: Denies new lymphadenopathy or easy bruising Behavioral/Psych: Mood is stable, no new changes  All other systems were reviewed with the patient and are negative.  I have reviewed the past medical history, past surgical history, social history and family history with the patient and they are unchanged from previous note.  ALLERGIES:  is allergic to shellfish allergy and penicillins.  MEDICATIONS:  Current Outpatient Medications  Medication Sig Dispense Refill  . albuterol (VENTOLIN HFA) 108 (90 Base) MCG/ACT inhaler Inhale 2 puffs into the lungs every 4 (four) hours as needed for wheezing or shortness of breath. 25.5 g 1  . amLODipine (NORVASC) 10 MG tablet Take 1 tablet (10 mg total) by mouth daily. 30 tablet 5  . bisoprolol (ZEBETA) 10 MG tablet TAKE 1 TABLET TWICE DAILY 180 tablet 1  . budesonide-formoterol (SYMBICORT) 80-4.5 MCG/ACT inhaler Inhale 2 puffs into the lungs 2 (two) times daily. 3 Inhaler 3  . desonide (DESOWEN) 0.05 % lotion Apply topically 2 (two) times daily. Continue following with dermatologist 59 mL 0  . dexamethasone (DECADRON) 4 MG tablet Take 2 tabs at the night before and 2 tabs the morning of chemotherapy, every 3 weeks, by mouth  36 tablet 6  . dextromethorphan (DELSYM) 30 MG/5ML liquid Take by mouth as needed for cough.     . hydrochlorothiazide (HYDRODIURIL) 25 MG tablet TAKE 1 TABLET EVERY DAY 90 tablet 3  . hydrocortisone 2.5 % cream Apply 1 application topically 2 (two) times daily.    Marland Kitchen lidocaine-prilocaine (EMLA) cream Apply to affected area once 30 g 3  . linaclotide (LINZESS) 145 MCG CAPS capsule Take 1 capsule (145 mcg total) by mouth daily before breakfast. 30 capsule 1  . loratadine (CLARITIN) 10 MG tablet Take 10 mg by mouth daily as needed for allergies.    Marland Kitchen morphine (MSIR) 15 MG tablet Take 1 tablet (15 mg total) by  mouth every 4 (four) hours as needed for severe pain. 30 tablet 0  . ondansetron (ZOFRAN) 8 MG tablet Take 1 tablet (8 mg total) by mouth every 8 (eight) hours as needed for nausea. 30 tablet 3  . predniSONE (DELTASONE) 10 MG tablet Take 0.5 tablets (5 mg total) by mouth daily with breakfast. 90 tablet 3  . prochlorperazine (COMPAZINE) 10 MG tablet Take 1 tablet (10 mg total) by mouth every 6 (six) hours as needed for nausea or vomiting. 30 tablet 3  . RABEprazole (ACIPHEX) 20 MG tablet Take 1 tablet (20 mg total) by mouth daily before breakfast. 90 tablet 3  . simvastatin (ZOCOR) 20 MG tablet Take 1 tablet (20 mg total) by mouth at bedtime. 90 tablet 3  . vitamin C (ASCORBIC ACID) 500 MG tablet Take 500 mg by mouth daily.     No current facility-administered medications for this visit.   Facility-Administered Medications Ordered in Other Visits  Medication Dose Route Frequency Provider Last Rate Last Admin  . CARBOplatin (PARAPLATIN) 550 mg in sodium chloride 0.9 % 250 mL chemo infusion  550 mg Intravenous Once Alvy Bimler, Breeze Berringer, MD      . heparin lock flush 100 unit/mL  500 Units Intracatheter Once PRN Alvy Bimler, Early Ord, MD      . PACLitaxel (TAXOL) 276 mg in sodium chloride 0.9 % 250 mL chemo infusion (> 34m/m2)  131.25 mg/m2 (Treatment Plan Recorded) Intravenous Once GHeath Lark MD 102 mL/hr at 08/30/20 1249 Rate Verify at 08/30/20 1249  . sodium chloride flush (NS) 0.9 % injection 10 mL  10 mL Intracatheter PRN GAlvy Bimler Ewell Benassi, MD        PHYSICAL EXAMINATION: ECOG PERFORMANCE STATUS: 1 - Symptomatic but completely ambulatory  Vitals:   08/30/20 0848 08/30/20 0851  BP: (!) 187/85 (!) 176/86  Pulse: 70   Resp: 18   Temp: 97.7 F (36.5 C)   SpO2: 100%    Filed Weights   08/30/20 0848  Weight: 217 lb 6.4 oz (98.6 kg)    GENERAL:alert, no distress and comfortable SKIN: skin color, texture, turgor are normal, no rashes or significant lesions EYES: normal, Conjunctiva are pink and non-injected,  sclera clear OROPHARYNX:no exudate, no erythema and lips, buccal mucosa, and tongue normal  NECK: supple, thyroid normal size, non-tender, without nodularity LYMPH:  no palpable lymphadenopathy in the cervical, axillary or inguinal LUNGS: clear to auscultation and percussion with normal breathing effort HEART: regular rate & rhythm and no murmurs with mild bilateral lower extremity edema ABDOMEN:abdomen soft, non-tender and normal bowel sounds Musculoskeletal:no cyanosis of digits and no clubbing  NEURO: alert & oriented x 3 with fluent speech, no focal motor/sensory deficits  LABORATORY DATA:  I have reviewed the data as listed    Component Value Date/Time   NA 139 08/30/2020 0842  K 4.3 08/30/2020 0842   CL 104 08/30/2020 0842   CO2 25 08/30/2020 0842   GLUCOSE 148 (H) 08/30/2020 0842   BUN 18 08/30/2020 0842   CREATININE 0.84 08/30/2020 0842   CALCIUM 9.5 08/30/2020 0842   PROT 8.1 08/30/2020 0842   ALBUMIN 3.5 08/30/2020 0842   AST 21 08/30/2020 0842   ALT 24 08/30/2020 0842   ALKPHOS 130 (H) 08/30/2020 0842   BILITOT 0.5 08/30/2020 0842   GFRNONAA >60 08/30/2020 0842   GFRAA >60 08/30/2020 0842    No results found for: SPEP, UPEP  Lab Results  Component Value Date   WBC 6.5 08/30/2020   NEUTROABS 4.6 08/30/2020   HGB 8.0 (L) 08/30/2020   HCT 23.5 (L) 08/30/2020   MCV 107.8 (H) 08/30/2020   PLT 160 08/30/2020      Chemistry      Component Value Date/Time   NA 139 08/30/2020 0842   K 4.3 08/30/2020 0842   CL 104 08/30/2020 0842   CO2 25 08/30/2020 0842   BUN 18 08/30/2020 0842   CREATININE 0.84 08/30/2020 0842      Component Value Date/Time   CALCIUM 9.5 08/30/2020 0842   ALKPHOS 130 (H) 08/30/2020 0842   AST 21 08/30/2020 0842   ALT 24 08/30/2020 0842   BILITOT 0.5 08/30/2020 1610

## 2020-08-30 NOTE — Assessment & Plan Note (Signed)
Overall, she tolerated treatment poorly due to severe pancytopenia Despite further dose adjustments and delaying treatment by 1 week, she is still anemic She is mildly symptomatic We will proceed with treatment with further dose adjustment I will continue to lengthen her cycle by 1 week and will see her back in 4 weeks for cycle 6 of treatment Due to high risk for transfusion, I will bring her back in approximately 10 days and recheck her blood and transfuse as needed

## 2020-09-08 ENCOUNTER — Inpatient Hospital Stay: Payer: Medicare HMO

## 2020-09-08 ENCOUNTER — Other Ambulatory Visit: Payer: Self-pay | Admitting: Hematology and Oncology

## 2020-09-08 ENCOUNTER — Other Ambulatory Visit: Payer: Self-pay

## 2020-09-08 ENCOUNTER — Telehealth: Payer: Self-pay | Admitting: Family Medicine

## 2020-09-08 VITALS — BP 158/84 | HR 62 | Temp 98.6°F | Resp 18

## 2020-09-08 DIAGNOSIS — C541 Malignant neoplasm of endometrium: Secondary | ICD-10-CM

## 2020-09-08 DIAGNOSIS — D539 Nutritional anemia, unspecified: Secondary | ICD-10-CM

## 2020-09-08 DIAGNOSIS — D61818 Other pancytopenia: Secondary | ICD-10-CM | POA: Diagnosis not present

## 2020-09-08 DIAGNOSIS — Z5111 Encounter for antineoplastic chemotherapy: Secondary | ICD-10-CM | POA: Diagnosis not present

## 2020-09-08 DIAGNOSIS — Z23 Encounter for immunization: Secondary | ICD-10-CM | POA: Diagnosis not present

## 2020-09-08 DIAGNOSIS — C775 Secondary and unspecified malignant neoplasm of intrapelvic lymph nodes: Secondary | ICD-10-CM

## 2020-09-08 DIAGNOSIS — H2013 Chronic iridocyclitis, bilateral: Secondary | ICD-10-CM | POA: Diagnosis not present

## 2020-09-08 DIAGNOSIS — H5213 Myopia, bilateral: Secondary | ICD-10-CM | POA: Diagnosis not present

## 2020-09-08 DIAGNOSIS — C7802 Secondary malignant neoplasm of left lung: Secondary | ICD-10-CM

## 2020-09-08 LAB — PREPARE RBC (CROSSMATCH)

## 2020-09-08 LAB — CBC WITH DIFFERENTIAL (CANCER CENTER ONLY)
Abs Immature Granulocytes: 0.02 10*3/uL (ref 0.00–0.07)
Basophils Absolute: 0 10*3/uL (ref 0.0–0.1)
Basophils Relative: 0 %
Eosinophils Absolute: 0 10*3/uL (ref 0.0–0.5)
Eosinophils Relative: 1 %
HCT: 22.8 % — ABNORMAL LOW (ref 36.0–46.0)
Hemoglobin: 8 g/dL — ABNORMAL LOW (ref 12.0–15.0)
Immature Granulocytes: 0 %
Lymphocytes Relative: 24 %
Lymphs Abs: 1.2 10*3/uL (ref 0.7–4.0)
MCH: 36.9 pg — ABNORMAL HIGH (ref 26.0–34.0)
MCHC: 35.1 g/dL (ref 30.0–36.0)
MCV: 105.1 fL — ABNORMAL HIGH (ref 80.0–100.0)
Monocytes Absolute: 0.4 10*3/uL (ref 0.1–1.0)
Monocytes Relative: 8 %
Neutro Abs: 3.3 10*3/uL (ref 1.7–7.7)
Neutrophils Relative %: 67 %
Platelet Count: 160 10*3/uL (ref 150–400)
RBC: 2.17 MIL/uL — ABNORMAL LOW (ref 3.87–5.11)
RDW: 15.7 % — ABNORMAL HIGH (ref 11.5–15.5)
WBC Count: 4.9 10*3/uL (ref 4.0–10.5)
nRBC: 1.4 % — ABNORMAL HIGH (ref 0.0–0.2)

## 2020-09-08 LAB — CMP (CANCER CENTER ONLY)
ALT: 19 U/L (ref 0–44)
AST: 15 U/L (ref 15–41)
Albumin: 3.6 g/dL (ref 3.5–5.0)
Alkaline Phosphatase: 105 U/L (ref 38–126)
Anion gap: 8 (ref 5–15)
BUN: 20 mg/dL (ref 8–23)
CO2: 27 mmol/L (ref 22–32)
Calcium: 9.6 mg/dL (ref 8.9–10.3)
Chloride: 106 mmol/L (ref 98–111)
Creatinine: 0.84 mg/dL (ref 0.44–1.00)
GFR, Est AFR Am: >60 mL/min (ref >60)
GFR, Estimated: >60 mL/min (ref >60)
Glucose, Bld: 109 mg/dL — ABNORMAL HIGH (ref 70–99)
Potassium: 4.4 mmol/L (ref 3.5–5.1)
Sodium: 141 mmol/L (ref 135–145)
Total Bilirubin: 0.4 mg/dL (ref 0.3–1.2)
Total Protein: 7.9 g/dL (ref 6.5–8.1)

## 2020-09-08 MED ORDER — HEPARIN SOD (PORK) LOCK FLUSH 100 UNIT/ML IV SOLN
500.0000 [IU] | Freq: Once | INTRAVENOUS | Status: AC
  Administered 2020-09-08: 500 [IU]
  Filled 2020-09-08: qty 5

## 2020-09-08 MED ORDER — DIPHENHYDRAMINE HCL 25 MG PO CAPS
25.0000 mg | ORAL_CAPSULE | Freq: Once | ORAL | Status: AC
  Administered 2020-09-08: 25 mg via ORAL

## 2020-09-08 MED ORDER — SODIUM CHLORIDE 0.9% FLUSH
10.0000 mL | Freq: Once | INTRAVENOUS | Status: AC
  Administered 2020-09-08: 10 mL
  Filled 2020-09-08: qty 10

## 2020-09-08 MED ORDER — DIPHENHYDRAMINE HCL 25 MG PO CAPS
ORAL_CAPSULE | ORAL | Status: AC
  Filled 2020-09-08: qty 1

## 2020-09-08 MED ORDER — SODIUM CHLORIDE 0.9% IV SOLUTION
250.0000 mL | Freq: Once | INTRAVENOUS | Status: AC
  Administered 2020-09-08: 250 mL via INTRAVENOUS
  Filled 2020-09-08: qty 250

## 2020-09-08 MED ORDER — ACETAMINOPHEN 325 MG PO TABS
650.0000 mg | ORAL_TABLET | Freq: Once | ORAL | Status: AC
  Administered 2020-09-08: 650 mg via ORAL

## 2020-09-08 MED ORDER — ACETAMINOPHEN 325 MG PO TABS
ORAL_TABLET | ORAL | Status: AC
  Filled 2020-09-08: qty 2

## 2020-09-08 NOTE — Patient Instructions (Signed)

## 2020-09-08 NOTE — Patient Instructions (Signed)

## 2020-09-08 NOTE — Progress Notes (Signed)
  Chronic Care Management   Outreach Note  09/08/2020 Name: [name] MRN: [id number] DOB: 08/25/1956  Referred by: [name], [name], MD Reason for referral : No chief complaint on file.   Third unsuccessful telephone outreach was attempted today. The patient was referred to the pharmacist for assistance with care management and care coordination.   Follow Up Plan:   Carley Perdue [employer] Scheduler

## 2020-09-09 LAB — BPAM RBC
Blood Product Expiration Date: 202110082359
ISSUE DATE / TIME: 202109241019
Unit Type and Rh: 6200

## 2020-09-09 LAB — TYPE AND SCREEN
ABO/RH(D): A POS
Antibody Screen: NEGATIVE
Unit division: 0

## 2020-09-12 ENCOUNTER — Inpatient Hospital Stay: Payer: Medicare HMO

## 2020-09-12 ENCOUNTER — Other Ambulatory Visit: Payer: Self-pay

## 2020-09-12 DIAGNOSIS — Z23 Encounter for immunization: Secondary | ICD-10-CM

## 2020-09-12 NOTE — Progress Notes (Signed)
   Covid-19 Vaccination Clinic  Name:  [name]    MRN: [id number] DOB: [birth year]  09/12/2020  [name] was observed post Covid-19 immunization for 30 minutes based on pre-vaccination screening without incident. She was provided with Vaccine Information Sheet and instruction to access the V-Safe system.   [name] was instructed to call [phone] with any severe reactions post vaccine: Marland Kitchen Difficulty breathing  . Swelling of face and throat  . A fast heartbeat  . A bad rash all over body  . Dizziness and weakness

## 2020-09-25 ENCOUNTER — Other Ambulatory Visit: Payer: Self-pay | Admitting: Internal Medicine

## 2020-09-27 ENCOUNTER — Inpatient Hospital Stay: Payer: Medicare HMO | Admitting: Hematology and Oncology

## 2020-09-27 ENCOUNTER — Inpatient Hospital Stay: Payer: Medicare HMO

## 2020-09-27 ENCOUNTER — Inpatient Hospital Stay: Payer: Medicare HMO | Attending: Gynecologic Oncology

## 2020-09-27 ENCOUNTER — Encounter: Payer: Self-pay | Admitting: Hematology and Oncology

## 2020-09-27 ENCOUNTER — Telehealth: Payer: Self-pay | Admitting: *Deleted

## 2020-09-27 ENCOUNTER — Other Ambulatory Visit: Payer: Self-pay

## 2020-09-27 ENCOUNTER — Other Ambulatory Visit: Payer: Self-pay | Admitting: Hematology and Oncology

## 2020-09-27 VITALS — BP 176/93

## 2020-09-27 VITALS — BP 203/109 | HR 88 | Temp 97.6°F | Resp 18 | Ht 65.5 in | Wt 219.6 lb

## 2020-09-27 DIAGNOSIS — C541 Malignant neoplasm of endometrium: Secondary | ICD-10-CM

## 2020-09-27 DIAGNOSIS — R739 Hyperglycemia, unspecified: Secondary | ICD-10-CM | POA: Diagnosis not present

## 2020-09-27 DIAGNOSIS — T451X5A Adverse effect of antineoplastic and immunosuppressive drugs, initial encounter: Secondary | ICD-10-CM

## 2020-09-27 DIAGNOSIS — I10 Essential (primary) hypertension: Secondary | ICD-10-CM

## 2020-09-27 DIAGNOSIS — C775 Secondary and unspecified malignant neoplasm of intrapelvic lymph nodes: Secondary | ICD-10-CM | POA: Diagnosis not present

## 2020-09-27 DIAGNOSIS — T50905A Adverse effect of unspecified drugs, medicaments and biological substances, initial encounter: Secondary | ICD-10-CM | POA: Diagnosis not present

## 2020-09-27 DIAGNOSIS — D61818 Other pancytopenia: Secondary | ICD-10-CM

## 2020-09-27 DIAGNOSIS — Z5111 Encounter for antineoplastic chemotherapy: Secondary | ICD-10-CM | POA: Insufficient documentation

## 2020-09-27 DIAGNOSIS — C7802 Secondary malignant neoplasm of left lung: Secondary | ICD-10-CM

## 2020-09-27 DIAGNOSIS — G620 Drug-induced polyneuropathy: Secondary | ICD-10-CM | POA: Diagnosis not present

## 2020-09-27 DIAGNOSIS — D539 Nutritional anemia, unspecified: Secondary | ICD-10-CM

## 2020-09-27 LAB — CMP (CANCER CENTER ONLY)
ALT: 22 U/L (ref 0–44)
AST: 19 U/L (ref 15–41)
Albumin: 3.5 g/dL (ref 3.5–5.0)
Alkaline Phosphatase: 121 U/L (ref 38–126)
Anion gap: 9 (ref 5–15)
BUN: 16 mg/dL (ref 8–23)
CO2: 25 mmol/L (ref 22–32)
Calcium: 9.9 mg/dL (ref 8.9–10.3)
Chloride: 105 mmol/L (ref 98–111)
Creatinine: 0.97 mg/dL (ref 0.44–1.00)
GFR, Estimated: >60 mL/min (ref >60)
Glucose, Bld: 272 mg/dL — ABNORMAL HIGH (ref 70–99)
Potassium: 4.5 mmol/L (ref 3.5–5.1)
Sodium: 139 mmol/L (ref 135–145)
Total Bilirubin: 0.4 mg/dL (ref 0.3–1.2)
Total Protein: 8.3 g/dL — ABNORMAL HIGH (ref 6.5–8.1)

## 2020-09-27 LAB — CBC WITH DIFFERENTIAL (CANCER CENTER ONLY)
Abs Immature Granulocytes: 0.06 10*3/uL (ref 0.00–0.07)
Basophils Absolute: 0 10*3/uL (ref 0.0–0.1)
Basophils Relative: 0 %
Eosinophils Absolute: 0 10*3/uL (ref 0.0–0.5)
Eosinophils Relative: 0 %
HCT: 26.5 % — ABNORMAL LOW (ref 36.0–46.0)
Hemoglobin: 9 g/dL — ABNORMAL LOW (ref 12.0–15.0)
Immature Granulocytes: 1 %
Lymphocytes Relative: 8 %
Lymphs Abs: 0.5 10*3/uL — ABNORMAL LOW (ref 0.7–4.0)
MCH: 34 pg (ref 26.0–34.0)
MCHC: 34 g/dL (ref 30.0–36.0)
MCV: 100 fL (ref 80.0–100.0)
Monocytes Absolute: 0.1 10*3/uL (ref 0.1–1.0)
Monocytes Relative: 2 %
Neutro Abs: 5.6 10*3/uL (ref 1.7–7.7)
Neutrophils Relative %: 89 %
Platelet Count: 142 10*3/uL — ABNORMAL LOW (ref 150–400)
RBC: 2.65 MIL/uL — ABNORMAL LOW (ref 3.87–5.11)
RDW: 17.4 % — ABNORMAL HIGH (ref 11.5–15.5)
WBC Count: 6.3 10*3/uL (ref 4.0–10.5)
nRBC: 0 % (ref 0.0–0.2)

## 2020-09-27 LAB — SAMPLE TO BLOOD BANK

## 2020-09-27 MED ORDER — SODIUM CHLORIDE 0.9 % IV SOLN
10.0000 mg | Freq: Once | INTRAVENOUS | Status: AC
  Administered 2020-09-27: 10 mg via INTRAVENOUS
  Filled 2020-09-27: qty 10

## 2020-09-27 MED ORDER — AMLODIPINE BESYLATE 10 MG PO TABS
10.0000 mg | ORAL_TABLET | Freq: Every day | ORAL | 5 refills | Status: DC
Start: 2020-09-27 — End: 2021-01-09

## 2020-09-27 MED ORDER — DIPHENHYDRAMINE HCL 50 MG/ML IJ SOLN
25.0000 mg | Freq: Once | INTRAMUSCULAR | Status: AC
  Administered 2020-09-27: 25 mg via INTRAVENOUS

## 2020-09-27 MED ORDER — SODIUM CHLORIDE 0.9 % IV SOLN
150.0000 mg | Freq: Once | INTRAVENOUS | Status: AC
  Administered 2020-09-27: 150 mg via INTRAVENOUS
  Filled 2020-09-27: qty 150

## 2020-09-27 MED ORDER — INSULIN ASPART 100 UNIT/ML ~~LOC~~ SOLN
SUBCUTANEOUS | Status: AC
  Filled 2020-09-27: qty 1

## 2020-09-27 MED ORDER — DIPHENHYDRAMINE HCL 50 MG/ML IJ SOLN
INTRAMUSCULAR | Status: AC
  Filled 2020-09-27: qty 1

## 2020-09-27 MED ORDER — PALONOSETRON HCL INJECTION 0.25 MG/5ML
INTRAVENOUS | Status: AC
  Filled 2020-09-27: qty 5

## 2020-09-27 MED ORDER — SODIUM CHLORIDE 0.9% FLUSH
10.0000 mL | INTRAVENOUS | Status: DC | PRN
  Administered 2020-09-27: 10 mL
  Filled 2020-09-27: qty 10

## 2020-09-27 MED ORDER — SODIUM CHLORIDE 0.9 % IV SOLN
131.2500 mg/m2 | Freq: Once | INTRAVENOUS | Status: AC
  Administered 2020-09-27: 276 mg via INTRAVENOUS
  Filled 2020-09-27: qty 46

## 2020-09-27 MED ORDER — PALONOSETRON HCL INJECTION 0.25 MG/5ML
0.2500 mg | Freq: Once | INTRAVENOUS | Status: AC
  Administered 2020-09-27: 0.25 mg via INTRAVENOUS

## 2020-09-27 MED ORDER — HEPARIN SOD (PORK) LOCK FLUSH 100 UNIT/ML IV SOLN
500.0000 [IU] | Freq: Once | INTRAVENOUS | Status: AC | PRN
  Administered 2020-09-27: 500 [IU]
  Filled 2020-09-27: qty 5

## 2020-09-27 MED ORDER — FAMOTIDINE IN NACL 20-0.9 MG/50ML-% IV SOLN
20.0000 mg | Freq: Once | INTRAVENOUS | Status: AC
  Administered 2020-09-27: 20 mg via INTRAVENOUS

## 2020-09-27 MED ORDER — SODIUM CHLORIDE 0.9 % IV SOLN
550.0000 mg | Freq: Once | INTRAVENOUS | Status: AC
  Administered 2020-09-27: 550 mg via INTRAVENOUS
  Filled 2020-09-27: qty 55

## 2020-09-27 MED ORDER — FAMOTIDINE IN NACL 20-0.9 MG/50ML-% IV SOLN
INTRAVENOUS | Status: AC
  Filled 2020-09-27: qty 50

## 2020-09-27 MED ORDER — SODIUM CHLORIDE 0.9 % IV SOLN
Freq: Once | INTRAVENOUS | Status: AC
  Filled 2020-09-27: qty 250

## 2020-09-27 MED ORDER — INSULIN ASPART 100 UNIT/ML ~~LOC~~ SOLN
10.0000 [IU] | Freq: Once | SUBCUTANEOUS | Status: AC
  Administered 2020-09-27: 10 [IU] via SUBCUTANEOUS

## 2020-09-27 NOTE — Assessment & Plan Note (Signed)
Her blood pressure is poorly controlled, likely due to anxiety I recommend she checks her blood pressure daily twice a day at home According to the patient, her blood pressure monitoring at home is satisfactory and within normal limits

## 2020-09-27 NOTE — Assessment & Plan Note (Signed)
she has mild peripheral neuropathy, likely related to side effects of treatment. I plan to continue on reduced dose of treatment as before

## 2020-09-27 NOTE — Telephone Encounter (Signed)
Per Dr.Gorsuch, pt blood sugar 250, infusion nurse is to give 10 units of insulin. Orders are in supportive therapy. Infusion nurse made aware

## 2020-09-27 NOTE — Patient Instructions (Signed)
[facility] Discharge Instructions for Patients Receiving Chemotherapy  Today you received the following chemotherapy agents: Paclitaxel, Carboplatin  To help prevent nausea and vomiting after your treatment, we encourage you to take your nausea medication as directed.   If you develop nausea and vomiting that is not controlled by your nausea medication, call the clinic.   BELOW ARE SYMPTOMS THAT SHOULD BE REPORTED IMMEDIATELY:  *FEVER GREATER THAN 100.5 F  *CHILLS WITH OR WITHOUT FEVER  NAUSEA AND VOMITING THAT IS NOT CONTROLLED WITH YOUR NAUSEA MEDICATION  *UNUSUAL SHORTNESS OF BREATH  *UNUSUAL BRUISING OR BLEEDING  TENDERNESS IN MOUTH AND THROAT WITH OR WITHOUT PRESENCE OF ULCERS  *URINARY PROBLEMS  *BOWEL PROBLEMS  UNUSUAL RASH Items with * indicate a potential emergency and should be followed up as soon as possible.  Feel free to call the clinic should you have any questions or concerns. The clinic phone number is [phone].  Please show the CHEMO ALERT CARD at check-in to the Emergency Department and triage nurse.   

## 2020-09-27 NOTE — Assessment & Plan Note (Signed)
Overall, she tolerated treatment poorly due to severe pancytopenia She felt better with recent transfusion support Due to high risk for transfusion, I will bring her back in approximately 10 days and recheck her blood and transfuse as needed After she completes today's treatment, I plan to order CT imaging for objective assessment of response to therapy If she have residual disease, we will consider maintenance treatment with something else

## 2020-09-27 NOTE — Assessment & Plan Note (Signed)
She has severe pancytopenia despite multiple dose adjustment I have ordered iron studies and B12 and they were adequate We will continue same dose as before She will return next week to have her hemoglobin rechecked and if her hemoglobin is less than 8, we will proceed with 1 unit of blood transfusion The risk, benefits, side effects of blood transfusion were discussed and she is in agreement with the plan of care

## 2020-09-27 NOTE — Assessment & Plan Note (Signed)
This is likely due to side effects of steroids I plan to give her some insulin while on treatment

## 2020-09-27 NOTE — Progress Notes (Signed)
[name] OFFICE PROGRESS NOTE  Patient Care Team: [name], [name], MD as PCP - General (Family Medicine)  ASSESSMENT & PLAN:  Endometrial cancer ([place]) Overall, she tolerated treatment poorly due to severe pancytopenia She felt better with recent transfusion support Due to high risk for transfusion, I will bring her back in approximately 10 days and recheck her blood and transfuse as needed After she completes today's treatment, I plan to order CT imaging for objective assessment of response to therapy If she have residual disease, we will consider maintenance treatment with something else  Pancytopenia, acquired [facility]) She has severe pancytopenia despite multiple dose adjustment I have ordered iron studies and B12 and they were adequate We will continue same dose as before She will return next week to have her hemoglobin rechecked and if her hemoglobin is less than 8, we will proceed with 1 unit of blood transfusion The risk, benefits, side effects of blood transfusion were discussed and she is in agreement with the plan of care  Peripheral neuropathy due to chemotherapy [facility]) she has mild peripheral neuropathy, likely related to side effects of treatment. I plan to continue on reduced dose of treatment as before  Essential hypertension Her blood pressure is poorly controlled, likely due to anxiety I recommend she checks her blood pressure daily twice a day at home According to the patient, her blood pressure monitoring at home is satisfactory and within normal limits  Drug-induced hyperglycemia This is likely due to side effects of steroids I plan to give her some insulin while on treatment   Orders Placed This Encounter  Procedures   CT CHEST ABDOMEN PELVIS W CONTRAST    Standing Status:   Future    Standing Expiration Date:   09/27/2021    Order Specific Question:   Preferred imaging location?    Answer:   [facility]    Order Specific Question:    Radiology Contrast Protocol - do NOT remove file path    Answer:   [email or web address]    All questions were answered. The patient knows to call the clinic with any problems, questions or concerns. The total time spent in the appointment was 40 minutes encounter with patients including review of chart and various tests results, discussions about plan of care and coordination of care plan   [name], MD 09/27/2020 10:56 AM  INTERVAL HISTORY: Please see below for problem oriented charting. She returns for treatment and follow-up Since last time I saw her, she feels okay Her blood pressure monitoring at home is satisfactory She felt better with recent blood transfusion support She takes laxative with each cycle of treatment to avoid severe constipation No recent nausea or vomiting She denies worsening peripheral neuropathy She felt that the recent dose reduction was helpful The patient denies any recent signs or symptoms of bleeding such as spontaneous epistaxis, hematuria or hematochezia.   SUMMARY OF ONCOLOGIC HISTORY: Oncology History Overview Note  ER/PR neg, MSI stable   Endometrial cancer ([place])  06/15/2018 Surgery   Surgeon: Donaciano Eva     Operation: Robotic-assisted laparoscopic total hysterectomy with bilateral salpingoophorectomy, SLN biopsy     Operative Findings:  : 6cm uterus, normal tubes and ovaries, omentum adherent to the anterior abdominal wall and uterine fundus. No suspicious nodes.   06/15/2018 Pathology Results   Procedure: Total hysterectomy with bilateral salpingo-oophorectomy. Bilateral obturator sentinel lymph node biopsies. Histologic type: Endometrioid adenocarcinoma. Histologic Grade: FIGO grade 1. Myometrial invasion: Depth of invasion: 15  mm Myometrial thickness: 20 mm Uterine Serosa Involvement: Not identified. Cervical stromal involvement: Not identified. Extent of involvement of other organs:  Uninvolved. Lymphovascular invasion: Present. Regional Lymph Nodes: Examined: 2 Sentinel 0 Non-sentinel 2 Total Lymph nodes with metastasis: 0 Isolated tumor cells (< 0.2 mm): 2 Micrometastasis: (> 0.2 mm and < 2.0 mm): 0 Macrometastasis: (> 2.0 mm): 0 Extracapsular extension: N/A. Tumor block for ancillary studies: 4Q-S. MMR / MSI testing: Will be ordered. Pathologic Stage Classification (pTNM, AJCC 8th edition): pT1b, pN0(i+)   07/20/2018 Imaging   1. Surgical changes from recent hysterectomy. No findings suspicious for residual tumor or adenopathy in the pelvis. 2. No CT findings to suggest omental, peritoneal surface or solid organ metastatic disease in the abdomen. 3. Changes consistent with known sarcoidosis in the chest with bulky calcified mediastinal and hilar lymph nodes and interstitial lung disease. I do not see any definite CT findings to suggest metastatic disease involving the chest.   08/19/2018 - 09/17/2018 Radiation Therapy   08/19/18 - 09/17/18: Vaginal Cuff, 3 cm cylinder with treatment length of 3 cm / 30 Gy delivered in 5 fractions of 6 Gy, Brachytherapy / HDR, Iridium-192   05/2005: Left Chest Wall keloid   04/2004 - 05/2004: Anus, pelvis /inguinal ( Dr. Tammi Klippel)   01/19/2020 Imaging   1. No definite CT findings of the abdomen or pelvis to explain pain. 2. The sigmoid colon and rectum are decompressed although slightly thickened appearing, suggestive of nonspecific infectious, inflammatory, or ischemic colitis. Correlate for referable clinical symptoms. 3. There is trace, nonspecific free fluid in the low pelvis, which may be reactive, although would be difficult to distinguish from postoperative and post treatment soft tissue change in the pelvis given history of hysterectomy and rectal cancer. 4. Unchanged prominent retroperitoneal and iliac lymph nodes. No new findings suspicious for metastatic disease in the abdomen or pelvis. 5. Bibasilar pulmonary fibrosis and  mediastinal and hilar lymphadenopathy, better evaluated by prior CT chest and consistent with patient diagnosis of sarcoidosis.       04/18/2020 Imaging   Venous Doppler Left:  - No evidence of deep vein thrombosis seen in the left lower extremity, from the common femoral through the popliteal veins.  - No evidence of superficial venous thrombosis in the left lower extremity.  - There is no evidence of venous reflux seen in the left lower extremity.  - Rouleax flow noted in the superficial system.    05/18/2020 PET scan   1. Complex scan with a combination of hypermetabolic granulomatous disease (sarcoidosis) as well as hypermetabolic metastatic endometrial carcinoma. 2. Hypermetabolic mediastinal hilar lymph nodes and pulmonary nodularity with bronchovascular thickening is consistent with sarcoidosis. 3. Solitary hypermetabolic nodule in the medial LEFT lower lobe, new from prior with intense metabolic activity, is concerning for a metastatic deposit to the LEFT lower lobe. 4. Ill-defined hypermetabolic soft tissue along the LEFT operator space is most consistent with local recurrence of endometrial carcinoma. There is entrapment of the LEFT ureter by this hypermetabolic soft tissue with mild hydroureter and hydronephrosis on the LEFT. 5. Additionally there appears to be direct extension from the hypermetabolic left operator space soft tissue to the sigmoid colon with there is a well-defined hypermetabolic lesion in the colon. 6. More distant hypermetabolic peritoneal metastasis along the ventral peritoneal surface of the RIGHT abdomen beneath RIGHT rectus abdominus muscle. 7. Hypermetabolic lymph nodes in the porta hepatis are favored related to sarcoidosis.   05/19/2020 Cancer Staging   Staging form: Corpus Uteri -  Carcinoma and Carcinosarcoma, AJCC 8th Edition - Clinical stage from 05/19/2020: Stage IVB (rcT1, cN2a, cM1) - Signed by [name], MD on 05/19/2020   05/26/2020 Procedure   Successful  placement of a right internal jugular approach power injectable Port-A-Cath. The catheter is ready for immediate use   05/29/2020 -  Chemotherapy   The patient had carboplatin and taxol for chemotherapy treatment.     07/31/2020 Imaging   1. Asymmetric soft tissue mass extending from the left vaginal cuff has decreased in size in the interval. No residual peritoneal nodularity. Pulmonary metastases are stable with exception of a minimally improved left lower lobe nodule. 2. Stable soft tissue nodule associated with the right rectus abdominus muscle. 3. Interval resolution of left hydronephrosis. 4. Calcified mediastinal and hilar lymph nodes, together with pulmonary parenchymal fibrosis, findings which are in keeping with patient's known diagnosis of sarcoid. 5. Aortic atherosclerosis (ICD10-I70.0). Coronary artery calcification.   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       REVIEW OF SYSTEMS:   Constitutional: Denies fevers, chills or abnormal weight loss Eyes: Denies blurriness of vision Ears, nose, mouth, throat, and face: Denies mucositis or sore throat Respiratory: Denies cough, dyspnea or wheezes Cardiovascular: Denies palpitation, chest discomfort or lower extremity swelling Skin: Denies abnormal skin rashes Lymphatics: Denies new lymphadenopathy or easy bruising Behavioral/Psych: Mood is stable, no new changes  All other systems were reviewed with the patient and are negative.  I have reviewed the past medical history, past surgical  history, social history and family history with the patient and they are unchanged from previous note.  ALLERGIES:  is allergic to shellfish allergy and penicillins.  MEDICATIONS:  Current Outpatient Medications  Medication Sig Dispense Refill   albuterol (VENTOLIN HFA) 108 (90 Base) MCG/ACT inhaler Inhale 2 puffs into the lungs every 4 (four) hours as needed for wheezing or shortness of breath. 25.5 g 1   amLODipine (NORVASC) 10 MG tablet Take 1 tablet (10 mg total) by mouth daily. 90 tablet 5   bisoprolol (ZEBETA) 10 MG tablet TAKE 1 TABLET TWICE DAILY 180 tablet 1   budesonide-formoterol (SYMBICORT) 80-4.5 MCG/ACT inhaler Inhale 2 puffs into the lungs 2 (two) times daily. 3 Inhaler 3   desonide (DESOWEN) 0.05 % lotion Apply topically 2 (two) times daily. Continue following with dermatologist 59 mL 0   dexamethasone (DECADRON) 4 MG tablet Take 2 tabs at the night before and 2 tabs the morning of chemotherapy, every 3 weeks, by mouth 36 tablet 6   dextromethorphan (DELSYM) 30 MG/5ML liquid Take by mouth as needed for cough.      hydrochlorothiazide (HYDRODIURIL) 25 MG tablet TAKE 1 TABLET EVERY DAY 90 tablet 3   hydrocortisone 2.5 % cream Apply 1 application topically 2 (two) times daily.     lidocaine-prilocaine (EMLA) cream Apply to affected area once 30 g 3   linaclotide (LINZESS) 145 MCG CAPS capsule Take 1 capsule (145 mcg total) by mouth daily before breakfast. 30 capsule 1   loratadine (CLARITIN) 10 MG tablet Take 10 mg by mouth daily as  needed for allergies.     morphine (MSIR) 15 MG tablet Take 1 tablet (15 mg total) by mouth every 4 (four) hours as needed for severe pain. 30 tablet 0   ondansetron (ZOFRAN) 8 MG tablet Take 1 tablet (8 mg total) by mouth every 8 (eight) hours as needed for nausea. 30 tablet 3   predniSONE (DELTASONE) 10 MG tablet Take 0.5 tablets (5 mg total) by mouth daily with breakfast. 90 tablet 3   prochlorperazine (COMPAZINE) 10 MG tablet Take 1  tablet (10 mg total) by mouth every 6 (six) hours as needed for nausea or vomiting. 30 tablet 3   RABEprazole (ACIPHEX) 20 MG tablet Take 1 tablet (20 mg total) by mouth daily before breakfast. 90 tablet 3   simvastatin (ZOCOR) 20 MG tablet Take 1 tablet (20 mg total) by mouth at bedtime. 90 tablet 3   vitamin C (ASCORBIC ACID) 500 MG tablet Take 500 mg by mouth daily.     No current facility-administered medications for this visit.   Facility-Administered Medications Ordered in Other Visits  Medication Dose Route Frequency Provider Last Rate Last Admin   CARBOplatin (PARAPLATIN) 550 mg in sodium chloride 0.9 % 250 mL chemo infusion  550 mg Intravenous Once Alvy Bimler, Recardo Linn, MD       dexamethasone (DECADRON) 10 mg in sodium chloride 0.9 % 50 mL IVPB  10 mg Intravenous Once Alvy Bimler, Obinna Ehresman, MD       famotidine (PEPCID) IVPB 20 mg premix  20 mg Intravenous Once Alvy Bimler, Monque Haggar, MD 200 mL/hr at 09/27/20 1050 20 mg at 09/27/20 1050   fosaprepitant (EMEND) 150 mg in sodium chloride 0.9 % 145 mL IVPB  150 mg Intravenous Once Alvy Bimler, Cecylia Brazill, MD       heparin lock flush 100 unit/mL  500 Units Intracatheter Once PRN Alvy Bimler, Kaziah Krizek, MD       insulin aspart (novoLOG) injection 10 Units  10 Units Subcutaneous Once Alvy Bimler, Aldo Sondgeroth, MD       PACLitaxel (TAXOL) 276 mg in sodium chloride 0.9 % 250 mL chemo infusion (> 57m/m2)  131.25 mg/m2 (Treatment Plan Recorded) Intravenous Once GAlvy Bimler Betsi Crespi, MD       sodium chloride flush (NS) 0.9 % injection 10 mL  10 mL Intracatheter PRN GAlvy Bimler Aylan Bayona, MD        PHYSICAL EXAMINATION: ECOG PERFORMANCE STATUS: 2 - Symptomatic, <50% confined to bed  Vitals:   09/27/20 0946  BP: (!) 203/109  Pulse: 88  Resp: 18  Temp: 97.6 F (36.4 C)  SpO2: 100%   Filed Weights   09/27/20 0946  Weight: 219 lb 9.6 oz (99.6 kg)    GENERAL:alert, no distress and comfortable SKIN: skin color, texture, turgor are normal, no rashes or significant lesions EYES: normal, Conjunctiva are pink and  non-injected, sclera clear OROPHARYNX:no exudate, no erythema and lips, buccal mucosa, and tongue normal  NECK: supple, thyroid normal size, non-tender, without nodularity LYMPH:  no palpable lymphadenopathy in the cervical, axillary or inguinal LUNGS: clear to auscultation and percussion with normal breathing effort HEART: regular rate & rhythm and no murmurs with mild bilateral lower extremity edema ABDOMEN:abdomen soft, non-tender and normal bowel sounds Musculoskeletal:no cyanosis of digits and no clubbing  NEURO: alert & oriented x 3 with fluent speech, no focal motor/sensory deficits  LABORATORY DATA:  I have reviewed the data as listed    Component Value Date/Time   NA 139 09/27/2020 0926   K 4.5 09/27/2020 0926   CL 105 09/27/2020 0926   CO2  25 09/27/2020 0926   GLUCOSE 272 (H) 09/27/2020 0926   BUN 16 09/27/2020 0926   CREATININE 0.97 09/27/2020 0926   CALCIUM 9.9 09/27/2020 0926   PROT 8.3 (H) 09/27/2020 0926   ALBUMIN 3.5 09/27/2020 0926   AST 19 09/27/2020 0926   ALT 22 09/27/2020 0926   ALKPHOS 121 09/27/2020 0926   BILITOT 0.4 09/27/2020 0926   GFRNONAA >60 09/27/2020 0926   GFRAA >60 09/08/2020 0819    No results found for: SPEP, UPEP  Lab Results  Component Value Date   WBC 6.3 09/27/2020   NEUTROABS 5.6 09/27/2020   HGB 9.0 (L) 09/27/2020   HCT 26.5 (L) 09/27/2020   MCV 100.0 09/27/2020   PLT 142 (L) 09/27/2020      Chemistry      Component Value Date/Time   NA 139 09/27/2020 0926   K 4.5 09/27/2020 0926   CL 105 09/27/2020 0926   CO2 25 09/27/2020 0926   BUN 16 09/27/2020 0926   CREATININE 0.97 09/27/2020 0926      Component Value Date/Time   CALCIUM 9.9 09/27/2020 0926   ALKPHOS 121 09/27/2020 0926   AST 19 09/27/2020 0926   ALT 22 09/27/2020 0926   BILITOT 0.4 09/27/2020 0926

## 2020-10-03 ENCOUNTER — Other Ambulatory Visit: Payer: Self-pay | Admitting: Internal Medicine

## 2020-10-03 ENCOUNTER — Ambulatory Visit (INDEPENDENT_AMBULATORY_CARE_PROVIDER_SITE_OTHER): Payer: Medicare HMO

## 2020-10-03 ENCOUNTER — Other Ambulatory Visit: Payer: Self-pay

## 2020-10-03 DIAGNOSIS — Z0000 Encounter for general adult medical examination without abnormal findings: Secondary | ICD-10-CM

## 2020-10-03 MED ORDER — BISOPROLOL FUMARATE 10 MG PO TABS
10.0000 mg | ORAL_TABLET | Freq: Two times a day (BID) | ORAL | 0 refills | Status: DC
Start: 2020-10-03 — End: 2021-12-12

## 2020-10-03 NOTE — Progress Notes (Signed)
Virtual Visit via Telephone Note  I connected with  [name] on 10/03/20 at  8:45 AM EDT by telephone and verified that I am speaking with the correct person using two identifiers.  Medicare Annual Wellness visit completed telephonically due to Covid-19 pandemic.   Persons participating in this call: This Health Coach and this patient.   Location: Patient: Home Provider: Office   I discussed the limitations, risks, security and privacy concerns of performing an evaluation and management service by telephone and the availability of in person appointments. The patient expressed understanding and agreed to proceed.  Unable to perform video visit due to video visit attempted and failed and/or patient does not have video capability.   Some vital signs may be absent or patient reported.   Willette Brace, LPN    Subjective:   [name] is a 64 y.o. female who presents for Medicare Annual (Subsequent) preventive examination.  Review of Systems     Cardiac Risk Factors include: advanced age (>46men, >64 women);dyslipidemia;hypertension;obesity (BMI >30kg/m2)     Objective:    There were no vitals filed for this visit. There is no height or weight on file to calculate BMI.  Advanced Directives 10/03/2020 09/08/2020 07/11/2020 05/26/2020 02/10/2020 08/09/2019 07/08/2019  Does Patient Have a Medical Advance Directive? No No No No No No No  Would patient like information on creating a medical advance directive? Yes (MAU/Ambulatory/Procedural Areas - Information given) No - Patient declined No - Patient declined No - Patient declined - - -    Current Medications (verified) Outpatient Encounter Medications as of 10/03/2020  Medication Sig  . albuterol (VENTOLIN HFA) 108 (90 Base) MCG/ACT inhaler Inhale 2 puffs into the lungs every 6 (six) hours as needed for wheezing or shortness of breath. Patient needs appt for further refills.  Marland Kitchen amLODipine (NORVASC) 10 MG tablet Take 1 tablet  (10 mg total) by mouth daily.  . bisoprolol (ZEBETA) 10 MG tablet Take 1 tablet (10 mg total) by mouth in the morning and at bedtime.  . budesonide-formoterol (SYMBICORT) 80-4.5 MCG/ACT inhaler Inhale 2 puffs into the lungs 2 (two) times daily.  Marland Kitchen desonide (DESOWEN) 0.05 % lotion Apply topically 2 (two) times daily. Continue following with dermatologist  . dexamethasone (DECADRON) 4 MG tablet Take 2 tabs at the night before and 2 tabs the morning of chemotherapy, every 3 weeks, by mouth  . dextromethorphan (DELSYM) 30 MG/5ML liquid Take by mouth as needed for cough.   . hydrocortisone 2.5 % cream Apply 1 application topically 2 (two) times daily.  Marland Kitchen lidocaine-prilocaine (EMLA) cream Apply to affected area once  . loratadine (CLARITIN) 10 MG tablet Take 10 mg by mouth daily as needed for allergies.  Marland Kitchen morphine (MSIR) 15 MG tablet Take 1 tablet (15 mg total) by mouth every 4 (four) hours as needed for severe pain.  Marland Kitchen ondansetron (ZOFRAN) 8 MG tablet Take 1 tablet (8 mg total) by mouth every 8 (eight) hours as needed for nausea.  . pantoprazole (PROTONIX) 40 MG tablet Take 1 tablet (40 mg total) by mouth daily. Patient needs appt for further refills.  . predniSONE (DELTASONE) 10 MG tablet Take 0.5 tablets (5 mg total) by mouth daily with breakfast. Patient needs appt for further refills  . prochlorperazine (COMPAZINE) 10 MG tablet Take 1 tablet (10 mg total) by mouth every 6 (six) hours as needed for nausea or vomiting.  . RABEprazole (ACIPHEX) 20 MG tablet Take 1 tablet (20 mg total) by mouth daily before breakfast.  .  vitamin C (ASCORBIC ACID) 500 MG tablet Take 500 mg by mouth daily.  . [DISCONTINUED] bisoprolol (ZEBETA) 10 MG tablet Patient needs appt for further refills  . [DISCONTINUED] hydrochlorothiazide (HYDRODIURIL) 25 MG tablet TAKE 1 TABLET EVERY DAY (Patient not taking: Reported on 10/03/2020)  . [DISCONTINUED] linaclotide (LINZESS) 145 MCG CAPS capsule Take 1 capsule (145 mcg total) by  mouth daily before breakfast. (Patient not taking: Reported on 10/03/2020)  . [DISCONTINUED] simvastatin (ZOCOR) 20 MG tablet Take 1 tablet (20 mg total) by mouth at bedtime. (Patient not taking: Reported on 10/03/2020)   No facility-administered encounter medications on file as of 10/03/2020.    Allergies (verified) Shellfish allergy and Penicillins   History: Past Medical History:  Diagnosis Date  . Bifid uvula   . Blood transfusion without reported diagnosis   . Endometrial ca ([place]) dx'd 06/2018   chemo  . GERD (gastroesophageal reflux disease)   . History of cervical dysplasia   . History of chemotherapy    06/ 2005  . History of cleft lip    CORRECTED  . History of condyloma acuminatum    PERIANAL  . History of radiation therapy    completed 06/ 2005  . History of vulvar dysplasia   . Hypertension   . 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 Pulmonary nodule, left   . 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 Sarcoid uveitis of left eye goes to [facility]--  currently no issues per pt 11-27-2016  . 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  ChemoXRT 2005 Dr Collier [place]ina Rubin/[facility]  . Upper airway cough syndrome   . Wears glasses    Past Surgical History:  Procedure Laterality Date  . BRONCHOSCOPY  01/22/2008   w/ Lavage and bronchial bx  . CARDIOVASCULAR STRESS TEST  06/27/2006   normal nuclear study w/ no ischemia/  normal LV function and wall motion , ef 61%  . CARPAL TUNNEL RELEASE Right 01/17/2016   and finger trigger release  . CLEFT LIP REPAIR    . CO2 LASER ABLATION VULVA    . EUA/  EXCISIONAL MULTIPLE PERINEAL/ANAL BX'S/  SIGMOIDSCOPY/  PORT-A-CATH PLACEMENT  03/27/2004   Local excision - Dr Lennie Hummer  . IR IMAGING GUIDED PORT INSERTION  05/26/2020  . LAPAROSCOPIC CHOLECYSTECTOMY  08/14/2008  . MULTIPLE ANAL BX'S  10/10/2006  . RELEASE A1 PULLEY THUMB  Bilateral left 12-17-2006/  right 09-05-2009  . REMOVAL PORT-A-CATH/  EUA/  MULTIPLE ANAL BX'S  10/16/2004  . ROBOTIC ASSISTED TOTAL HYSTERECTOMY WITH BILATERAL SALPINGO OOPHERECTOMY Bilateral 06/25/2018   Procedure: XI ROBOTIC ASSISTED TOTAL HYSTERECTOMY WITH BILATERAL SALPINGO OOPHORECTOMY;  Surgeon: Everitt Amber, MD;  Location: [facility];  Service: Gynecology;  Laterality: Bilateral;  . SENTINEL NODE BIOPSY N/A 06/25/2018   Procedure: SENTINEL NODE BIOPSY;  Surgeon: Everitt Amber, MD;  Location: [facility];  Service: Gynecology;  Laterality: N/A;  . TENNIS ELBOW RELEASE/NIRSCHEL PROCEDURE Right 11/28/2016   Procedure: RIGHT ELBOW ULNER NERVE RELEASE AND OR TRANSPOSITION;  Surgeon: Iran Planas, MD;  Location: [facility];  Service: Orthopedics;  Laterality: Right;   Family History  Problem Relation Age of Onset  . Allergies Mother   . Asthma Mother   . Lung cancer Mother   . Allergies Sister   . Allergies Brother   . Asthma Brother   . Asthma Sister   . Sarcoidosis Neg  Hx   . Colon cancer Neg Hx   . Liver cancer Neg Hx   . Stomach cancer Neg Hx   . Esophageal cancer Neg Hx   . Rectal cancer Neg Hx    Social History   Socioeconomic History  . Marital status: Single    Spouse name: Not on file  . Number of children: 2  . Years of education: Not on file  . Highest education level: Not on file  Occupational History  . 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 Smoking status: Former Smoker    Packs/day: 0.50    Years: 1.00    Pack years: 0.50    Types: Cigarettes    Quit date: 12/16/1993    Years since quitting: 26.8  . Smokeless tobacco: Never Used  Vaping Use  . Vaping Use: Never used  Substance and Sexual Activity  . Alcohol use: Yes    Comment: occasional  . Drug use: No  . Sexual activity: Never  Other Topics Concern  . Not on file  Social History Narrative  . Not on file   Social Determinants of Health   Financial Resource  Strain: Low Risk   . Difficulty of Paying Living Expenses: Not hard at all  Food Insecurity: No Food Insecurity  . Worried About Charity fundraiser in the Last Year: Never true  . Ran Out of Food in the Last Year: Never true  Transportation Needs: No Transportation Needs  . Lack of Transportation (Medical): No  . Lack of Transportation (Non-Medical): No  Physical Activity: Inactive  . Days of Exercise per Week: 0 days  . Minutes of Exercise per Session: 0 min  Stress: No Stress Concern Present  . Feeling of Stress : Not at all  Social Connections: Socially Isolated  . Frequency of Communication with Friends and Family: More than three times a week  . Frequency of Social Gatherings with Friends and Family: More than three times a week  . Attends Religious Services: Never  . Active Member of Clubs or Organizations: No  . Attends Archivist Meetings: Never  . Marital Status: Never married    Tobacco Counseling Counseling given: Not Answered   Clinical Intake:  Pre-visit preparation completed: Yes  Pain : No/denies pain     BMI - recorded: 35.99 Nutritional Status: BMI > 30  Obese Nutritional Risks: Unintentional weight loss (related to chemo treatments) Diabetes: No  How often do you need to have someone help you when you read instructions, pamphlets, or other written materials from your doctor or pharmacy?: 1 - Never  Diabetic?No  Interpreter Needed?: No  Information entered by :: Charlott Rakes, LPN   Activities of Daily Living In your present state of health, do you have any difficulty performing the following activities: 10/03/2020 05/26/2020  Hearing? N N  Vision? N N  Difficulty concentrating or making decisions? N N  Walking or climbing stairs? Y N  Comment SOB upon exertion -  Dressing or bathing? N N  Doing errands, shopping? N -  Preparing Food and eating ? N -  Using the Toilet? N -  In the past six months, have you accidently leaked urine?  N -  Do you have problems with loss of bowel control? N -  Managing your Medications? N -  Managing your Finances? N -  Housekeeping or managing your Housekeeping? N -  Some recent data might be hidden  Patient Care Team: [name], [name], MD as PCP - General (Family Medicine)  Indicate any recent Medical Services you may have received from other than [facility] providers in the past year (date may be approximate).     Assessment:   This is a routine wellness examination for [name].  Hearing/Vision screen  Hearing Screening   125Hz  250Hz  500Hz  1000Hz  2000Hz  3000Hz  4000Hz  6000Hz  8000Hz   Right ear:           Left ear:           Comments: Denies any hearing difficulty  Vision Screening Comments: Dr Nyoka Cowden at [facility] for annual eye exams  Dietary issues and exercise activities discussed: Current Exercise Habits: The patient does not participate in regular exercise at present, Exercise limited by: Other - see comments (chemo treatments take alot of her energy)  Goals    . Patient Stated     Get back to having energy       Depression Screen PHQ 2/9 Scores 10/03/2020 09/29/2019 10/22/2018 07/22/2018 09/12/2016 08/12/2016 08/12/2016  PHQ - 2 Score 0 0 0 1 0 0 0    Fall Risk Fall Risk  10/03/2020 09/29/2019 10/22/2018 07/22/2018 09/12/2016  Falls in the past year? 0 0 0 No No  Number falls in past yr: 0 0 - - -  Injury with Fall? 0 0 - - -  Risk for fall due to : Impaired vision;Impaired balance/gait - - - -  Follow up Falls prevention discussed Education provided - - -    Any stairs in or around the home? No  If so, are there any without handrails? No  Home free of loose throw rugs in walkways, pet beds, electrical cords, etc? Yes  Adequate lighting in your home to reduce risk of falls? Yes   ASSISTIVE DEVICES UTILIZED TO PREVENT FALLS:  Life alert? No  Use of a cane, walker or w/c? No  Grab bars in the bathroom? Yes  Shower chair or bench in shower? Yes  Elevated  toilet seat or a handicapped toilet? Yes   TIMED UP AND GO:  Was the test performed? No .      Cognitive Function:     6CIT Screen 10/03/2020  What Year? 0 points  What month? 0 points  Count back from 20 0 points  Months in reverse 0 points  Repeat phrase 0 points    Immunizations Immunization History  Administered Date(s) Administered  . Influenza Split 10/01/2011, 09/23/2012  . Influenza Whole 09/15/2010  . PFIZER SARS-COV-2 Vaccination 03/10/2020, 03/31/2020, 09/12/2020  . Tdap 07/06/2013    TDAP status: Up to date Flu Vaccine status: Declined, Education has been provided regarding the importance of this vaccine but patient still declined. Advised may receive this vaccine at local pharmacy or Health Dept. Aware to provide a copy of the vaccination record if obtained from local pharmacy or Health Dept. Verbalized acceptance and understanding. Pneumococcal vaccine status: Declined,  Education has been provided regarding the importance of this vaccine but patient still declined. Advised may receive this vaccine at local pharmacy or Health Dept. Aware to provide a copy of the vaccination record if obtained from local pharmacy or Health Dept. Verbalized acceptance and understanding.  Covid-19 vaccine status: Completed vaccines  Qualifies for Shingles Vaccine? Yes   Zostavax completed No   Shingrix Completed?: No.    Education has been provided regarding the importance of this vaccine. Patient has been advised to call insurance company to determine out of pocket expense  if they have not yet received this vaccine. Advised may also receive vaccine at local pharmacy or Health Dept. Verbalized acceptance and understanding.  Screening Tests Health Maintenance  Topic Date Due  . MAMMOGRAM  11/07/2016  . PAP SMEAR-Modifier  09/13/2019  . COLONOSCOPY  02/21/2022  . TETANUS/TDAP  07/07/2023  . COVID-19 Vaccine  Completed  . Hepatitis C Screening  Completed  . HIV Screening   Completed  . INFLUENZA VACCINE  Discontinued    Health Maintenance  Health Maintenance Due  Topic Date Due  . MAMMOGRAM  11/07/2016  . PAP SMEAR-Modifier  09/13/2019    Colorectal cancer screening: Completed 02/22/20. Repeat every 3 years Mammogram status: Completed 11/22/14. Repeat every year Bone Density status: Completed 11/19/19. Results reflect: Bone density results: OSTEOPENIA. Repeat every 2 years.    Additional Screening:  Hepatitis C Screening: Completed 01/20/08  Vision Screening: Recommended annual ophthalmology exams for early detection of glaucoma and other disorders of the eye. Is the patient up to date with their annual eye exam?  Yes  Who is the provider or what is the name of the office in which the patient attends annual eye exams? Dr Nyoka Cowden   Dental Screening: Recommended annual dental exams for proper oral hygiene  Community Resource Referral / Chronic Care Management: CRR required this visit?  No   CCM required this visit?  No      Plan:     I have personally reviewed and noted the following in the patient's chart:   . Medical and social history . Use of alcohol, tobacco or illicit drugs  . Current medications and supplements . Functional ability and status . Nutritional status . Physical activity . Advanced directives . List of other physicians . Hospitalizations, surgeries, and ER visits in previous 12 months . Vitals . Screenings to include cognitive, depression, and falls . Referrals and appointments  In addition, I have reviewed and discussed with patient certain preventive protocols, quality metrics, and best practice recommendations. A written personalized care plan for preventive services as well as general preventive health recommendations were provided to patient.     Willette Brace, LPN   [id number]/84/[id number]   Nurse Notes: None

## 2020-10-03 NOTE — Patient Instructions (Signed)
[name] , Thank you for taking time to come for your Medicare Wellness Visit. I appreciate your ongoing commitment to your health goals. Please review the following plan we discussed and let me know if I can assist you in the future.   Screening recommendations/referrals: Colonoscopy: Done 02/22/20 Mammogram: Done 11/22/14 Bone Density: Done 11/29/19 Recommended yearly ophthalmology/optometry visit for glaucoma screening and checkup Recommended yearly dental visit for hygiene and checkup  Vaccinations: Influenza vaccine: Declines  Pneumococcal vaccine: Declined and discussed Tdap vaccine: Up to date Shingles vaccine: Shingrix discussed. Please contact your pharmacy for coverage information.   Covid-19: Completed 3/26, 4/16, & 09/12/20  Advanced directives: Advance directive discussed with you today. I have provided a copy for you to complete at home and have notarized. Once this is complete please bring a copy in to our office so we can scan it into your chart.  Conditions/risks identified: Get back to having more energy  Next appointment: Follow up in one year for your annual wellness visit.   Preventive Care 40-64 Years, Female Preventive care refers to lifestyle choices and visits with your health care provider that can promote health and wellness. What does preventive care include?  A yearly physical exam. This is also called an annual well check.  Dental exams once or twice a year.  Routine eye exams. Ask your health care provider how often you should have your eyes checked.  Personal lifestyle choices, including:  Daily care of your teeth and gums.  Regular physical activity.  Eating a healthy diet.  Avoiding tobacco and drug use.  Limiting alcohol use.  Practicing safe sex.  Taking low-dose aspirin daily starting at age 70.  Taking vitamin and mineral supplements as recommended by your health care provider. What happens during an annual well check? The  services and screenings done by your health care provider during your annual well check will depend on your age, overall health, lifestyle risk factors, and family history of disease. Counseling  Your health care provider may ask you questions about your:  Alcohol use.  Tobacco use.  Drug use.  Emotional well-being.  Home and relationship well-being.  Sexual activity.  Eating habits.  Work and work Statistician.  Method of birth control.  Menstrual cycle.  Pregnancy history. Screening  You may have the following tests or measurements:  Height, weight, and BMI.  Blood pressure.  Lipid and cholesterol levels. These may be checked every 5 years, or more frequently if you are over 36 years old.  Skin check.  Lung cancer screening. You may have this screening every year starting at age 25 if you have a 30-pack-year history of smoking and currently smoke or have quit within the past 15 years.  Fecal occult blood test (FOBT) of the stool. You may have this test every year starting at age 32.  Flexible sigmoidoscopy or colonoscopy. You may have a sigmoidoscopy every 5 years or a colonoscopy every 10 years starting at age 35.  Hepatitis C blood test.  Hepatitis B blood test.  Sexually transmitted disease (STD) testing.  Diabetes screening. This is done by checking your blood sugar (glucose) after you have not eaten for a while (fasting). You may have this done every 1-3 years.  Mammogram. This may be done every 1-2 years. Talk to your health care provider about when you should start having regular mammograms. This may depend on whether you have a family history of breast cancer.  BRCA-related cancer screening. This may be done if  you have a family history of breast, ovarian, tubal, or peritoneal cancers.  Pelvic exam and Pap test. This may be done every 3 years starting at age 37. Starting at age 48, this may be done every 5 years if you have a Pap test in combination with  an HPV test.  Bone density scan. This is done to screen for osteoporosis. You may have this scan if you are at high risk for osteoporosis. Discuss your test results, treatment options, and if necessary, the need for more tests with your health care provider. Vaccines  Your health care provider may recommend certain vaccines, such as:  Influenza vaccine. This is recommended every year.  Tetanus, diphtheria, and acellular pertussis (Tdap, Td) vaccine. You may need a Td booster every 10 years.  Zoster vaccine. You may need this after age 69.  Pneumococcal 13-valent conjugate (PCV13) vaccine. You may need this if you have certain conditions and were not previously vaccinated.  Pneumococcal polysaccharide (PPSV23) vaccine. You may need one or two doses if you smoke cigarettes or if you have certain conditions. Talk to your health care provider about which screenings and vaccines you need and how often you need them. This information is not intended to replace advice given to you by your health care provider. Make sure you discuss any questions you have with your health care provider. Document Released: 12/29/2015 Document Revised: 08/21/2016 Document Reviewed: 10/03/2015 Elsevier Interactive Patient Education  2017 [place] Prevention in the Home Falls can cause injuries. They can happen to people of all ages. There are many things you can do to make your home safe and to help prevent falls. What can I do on the outside of my home?  Regularly fix the edges of walkways and driveways and fix any cracks.  Remove anything that might make you trip as you walk through a door, such as a raised step or threshold.  Trim any bushes or trees on the path to your home.  Use bright outdoor lighting.  Clear any walking paths of anything that might make someone trip, such as rocks or tools.  Regularly check to see if handrails are loose or broken. Make sure that both sides of any steps  have handrails.  Any raised decks and porches should have guardrails on the edges.  Have any leaves, snow, or ice cleared regularly.  Use sand or salt on walking paths during winter.  Clean up any spills in your garage right away. This includes oil or grease spills. What can I do in the bathroom?  Use night lights.  Install grab bars by the toilet and in the tub and shower. Do not use towel bars as grab bars.  Use non-skid mats or decals in the tub or shower.  If you need to sit down in the shower, use a plastic, non-slip stool.  Keep the floor dry. Clean up any water that spills on the floor as soon as it happens.  Remove soap buildup in the tub or shower regularly.  Attach bath mats securely with double-sided non-slip rug tape.  Do not have throw rugs and other things on the floor that can make you trip. What can I do in the bedroom?  Use night lights.  Make sure that you have a light by your bed that is easy to reach.  Do not use any sheets or blankets that are too big for your bed. They should not hang down onto the floor.  Have a firm chair that has side arms. You can use this for support while you get dressed.  Do not have throw rugs and other things on the floor that can make you trip. What can I do in the kitchen?  Clean up any spills right away.  Avoid walking on wet floors.  Keep items that you use a lot in easy-to-reach places.  If you need to reach something above you, use a strong step stool that has a grab bar.  Keep electrical cords out of the way.  Do not use floor polish or wax that makes floors slippery. If you must use wax, use non-skid floor wax.  Do not have throw rugs and other things on the floor that can make you trip. What can I do with my stairs?  Do not leave any items on the stairs.  Make sure that there are handrails on both sides of the stairs and use them. Fix handrails that are broken or loose. Make sure that handrails are as  long as the stairways.  Check any carpeting to make sure that it is firmly attached to the stairs. Fix any carpet that is loose or worn.  Avoid having throw rugs at the top or bottom of the stairs. If you do have throw rugs, attach them to the floor with carpet tape.  Make sure that you have a light switch at the top of the stairs and the bottom of the stairs. If you do not have them, ask someone to add them for you. What else can I do to help prevent falls?  Wear shoes that:  Do not have high heels.  Have rubber bottoms.  Are comfortable and fit you well.  Are closed at the toe. Do not wear sandals.  If you use a stepladder:  Make sure that it is fully opened. Do not climb a closed stepladder.  Make sure that both sides of the stepladder are locked into place.  Ask someone to hold it for you, if possible.  Clearly mark and make sure that you can see:  Any grab bars or handrails.  First and last steps.  Where the edge of each step is.  Use tools that help you move around (mobility aids) if they are needed. These include:  Canes.  Walkers.  Scooters.  Crutches.  Turn on the lights when you go into a dark area. Replace any light bulbs as soon as they burn out.  Set up your furniture so you have a clear path. Avoid moving your furniture around.  If any of your floors are uneven, fix them.  If there are any pets around you, be aware of where they are.  Review your medicines with your doctor. Some medicines can make you feel dizzy. This can increase your chance of falling. Ask your doctor what other things that you can do to help prevent falls. This information is not intended to replace advice given to you by your health care provider. Make sure you discuss any questions you have with your health care provider. Document Released: 09/28/2009 Document Revised: 05/09/2016 Document Reviewed: 01/06/2015 Elsevier Interactive Patient Education  2017 [employer].

## 2020-10-04 ENCOUNTER — Telehealth: Payer: Self-pay

## 2020-10-04 NOTE — Telephone Encounter (Signed)
She called to review appt time for 10/22. She verbalzied understanding.

## 2020-10-06 ENCOUNTER — Other Ambulatory Visit: Payer: Self-pay

## 2020-10-06 ENCOUNTER — Inpatient Hospital Stay: Payer: Medicare HMO

## 2020-10-06 DIAGNOSIS — Z5111 Encounter for antineoplastic chemotherapy: Secondary | ICD-10-CM | POA: Diagnosis not present

## 2020-10-06 DIAGNOSIS — R739 Hyperglycemia, unspecified: Secondary | ICD-10-CM | POA: Diagnosis not present

## 2020-10-06 DIAGNOSIS — D539 Nutritional anemia, unspecified: Secondary | ICD-10-CM

## 2020-10-06 DIAGNOSIS — C541 Malignant neoplasm of endometrium: Secondary | ICD-10-CM

## 2020-10-06 DIAGNOSIS — C7802 Secondary malignant neoplasm of left lung: Secondary | ICD-10-CM

## 2020-10-06 DIAGNOSIS — C775 Secondary and unspecified malignant neoplasm of intrapelvic lymph nodes: Secondary | ICD-10-CM | POA: Diagnosis not present

## 2020-10-06 LAB — CMP (CANCER CENTER ONLY)
ALT: 17 U/L (ref 0–44)
AST: 13 U/L — ABNORMAL LOW (ref 15–41)
Albumin: 3.4 g/dL — ABNORMAL LOW (ref 3.5–5.0)
Alkaline Phosphatase: 106 U/L (ref 38–126)
Anion gap: 8 (ref 5–15)
BUN: 18 mg/dL (ref 8–23)
CO2: 26 mmol/L (ref 22–32)
Calcium: 9.3 mg/dL (ref 8.9–10.3)
Chloride: 106 mmol/L (ref 98–111)
Creatinine: 0.8 mg/dL (ref 0.44–1.00)
GFR, Estimated: >60 mL/min (ref >60)
Glucose, Bld: 125 mg/dL — ABNORMAL HIGH (ref 70–99)
Potassium: 4.4 mmol/L (ref 3.5–5.1)
Sodium: 140 mmol/L (ref 135–145)
Total Bilirubin: 0.2 mg/dL — ABNORMAL LOW (ref 0.3–1.2)
Total Protein: 7.4 g/dL (ref 6.5–8.1)

## 2020-10-06 LAB — CBC WITH DIFFERENTIAL (CANCER CENTER ONLY)
Abs Immature Granulocytes: 0.03 10*3/uL (ref 0.00–0.07)
Basophils Absolute: 0 10*3/uL (ref 0.0–0.1)
Basophils Relative: 0 %
Eosinophils Absolute: 0.1 10*3/uL (ref 0.0–0.5)
Eosinophils Relative: 1 %
HCT: 25.3 % — ABNORMAL LOW (ref 36.0–46.0)
Hemoglobin: 8.5 g/dL — ABNORMAL LOW (ref 12.0–15.0)
Immature Granulocytes: 1 %
Lymphocytes Relative: 30 %
Lymphs Abs: 1.2 10*3/uL (ref 0.7–4.0)
MCH: 34.8 pg — ABNORMAL HIGH (ref 26.0–34.0)
MCHC: 33.6 g/dL (ref 30.0–36.0)
MCV: 103.7 fL — ABNORMAL HIGH (ref 80.0–100.0)
Monocytes Absolute: 0.6 10*3/uL (ref 0.1–1.0)
Monocytes Relative: 14 %
Neutro Abs: 2.2 10*3/uL (ref 1.7–7.7)
Neutrophils Relative %: 54 %
Platelet Count: 149 10*3/uL — ABNORMAL LOW (ref 150–400)
RBC: 2.44 MIL/uL — ABNORMAL LOW (ref 3.87–5.11)
RDW: 16.9 % — ABNORMAL HIGH (ref 11.5–15.5)
WBC Count: 4 10*3/uL (ref 4.0–10.5)
nRBC: 1 % — ABNORMAL HIGH (ref 0.0–0.2)

## 2020-10-06 LAB — SAMPLE TO BLOOD BANK

## 2020-10-06 MED ORDER — SODIUM CHLORIDE 0.9% FLUSH
10.0000 mL | Freq: Once | INTRAVENOUS | Status: AC
  Administered 2020-10-06: 10 mL
  Filled 2020-10-06: qty 10

## 2020-10-06 MED ORDER — HEPARIN SOD (PORK) LOCK FLUSH 100 UNIT/ML IV SOLN
500.0000 [IU] | Freq: Once | INTRAVENOUS | Status: AC
  Administered 2020-10-06: 500 [IU]
  Filled 2020-10-06: qty 5

## 2020-10-27 ENCOUNTER — Inpatient Hospital Stay: Payer: Medicare HMO | Attending: Gynecologic Oncology

## 2020-10-27 ENCOUNTER — Inpatient Hospital Stay (HOSPITAL_BASED_OUTPATIENT_CLINIC_OR_DEPARTMENT_OTHER): Payer: Medicare HMO | Admitting: Hematology and Oncology

## 2020-10-27 ENCOUNTER — Inpatient Hospital Stay: Payer: Medicare HMO

## 2020-10-27 ENCOUNTER — Telehealth: Payer: Self-pay | Admitting: Oncology

## 2020-10-27 ENCOUNTER — Telehealth: Payer: Self-pay | Admitting: Hematology and Oncology

## 2020-10-27 ENCOUNTER — Other Ambulatory Visit: Payer: Self-pay

## 2020-10-27 ENCOUNTER — Ambulatory Visit (HOSPITAL_COMMUNITY)
Admission: RE | Admit: 2020-10-27 | Discharge: 2020-10-27 | Disposition: A | Discharge status: Home / Self Care | Payer: Medicare HMO | Source: Ambulatory Visit | Attending: Hematology and Oncology | Admitting: Hematology and Oncology

## 2020-10-27 ENCOUNTER — Encounter: Payer: Self-pay | Admitting: Hematology and Oncology

## 2020-10-27 DIAGNOSIS — C541 Malignant neoplasm of endometrium: Secondary | ICD-10-CM

## 2020-10-27 DIAGNOSIS — C775 Secondary and unspecified malignant neoplasm of intrapelvic lymph nodes: Secondary | ICD-10-CM

## 2020-10-27 DIAGNOSIS — D869 Sarcoidosis, unspecified: Secondary | ICD-10-CM | POA: Diagnosis not present

## 2020-10-27 DIAGNOSIS — N898 Other specified noninflammatory disorders of vagina: Secondary | ICD-10-CM | POA: Diagnosis not present

## 2020-10-27 DIAGNOSIS — G620 Drug-induced polyneuropathy: Secondary | ICD-10-CM

## 2020-10-27 DIAGNOSIS — D539 Nutritional anemia, unspecified: Secondary | ICD-10-CM

## 2020-10-27 DIAGNOSIS — I2510 Atherosclerotic heart disease of native coronary artery without angina pectoris: Secondary | ICD-10-CM | POA: Diagnosis not present

## 2020-10-27 DIAGNOSIS — T451X5A Adverse effect of antineoplastic and immunosuppressive drugs, initial encounter: Secondary | ICD-10-CM

## 2020-10-27 DIAGNOSIS — R609 Edema, unspecified: Secondary | ICD-10-CM | POA: Diagnosis not present

## 2020-10-27 DIAGNOSIS — R600 Localized edema: Secondary | ICD-10-CM | POA: Diagnosis not present

## 2020-10-27 DIAGNOSIS — C55 Malignant neoplasm of uterus, part unspecified: Secondary | ICD-10-CM | POA: Diagnosis not present

## 2020-10-27 DIAGNOSIS — I10 Essential (primary) hypertension: Secondary | ICD-10-CM | POA: Insufficient documentation

## 2020-10-27 DIAGNOSIS — D61818 Other pancytopenia: Secondary | ICD-10-CM | POA: Diagnosis not present

## 2020-10-27 DIAGNOSIS — K449 Diaphragmatic hernia without obstruction or gangrene: Secondary | ICD-10-CM | POA: Diagnosis not present

## 2020-10-27 DIAGNOSIS — J984 Other disorders of lung: Secondary | ICD-10-CM | POA: Diagnosis not present

## 2020-10-27 DIAGNOSIS — C7802 Secondary malignant neoplasm of left lung: Secondary | ICD-10-CM

## 2020-10-27 LAB — CMP (CANCER CENTER ONLY)
ALT: 15 U/L (ref 0–44)
AST: 24 U/L (ref 15–41)
Albumin: 3.7 g/dL (ref 3.5–5.0)
Alkaline Phosphatase: 103 U/L (ref 38–126)
Anion gap: 12 (ref 5–15)
BUN: 22 mg/dL (ref 8–23)
CO2: 22 mmol/L (ref 22–32)
Calcium: 9.3 mg/dL (ref 8.9–10.3)
Chloride: 105 mmol/L (ref 98–111)
Creatinine: 0.84 mg/dL (ref 0.44–1.00)
GFR, Estimated: >60 mL/min (ref >60)
Glucose, Bld: 125 mg/dL — ABNORMAL HIGH (ref 70–99)
Potassium: 4.2 mmol/L (ref 3.5–5.1)
Sodium: 139 mmol/L (ref 135–145)
Total Bilirubin: 0.5 mg/dL (ref 0.3–1.2)
Total Protein: 8.3 g/dL — ABNORMAL HIGH (ref 6.5–8.1)

## 2020-10-27 LAB — CBC WITH DIFFERENTIAL (CANCER CENTER ONLY)
Abs Immature Granulocytes: 0.03 10*3/uL (ref 0.00–0.07)
Basophils Absolute: 0 10*3/uL (ref 0.0–0.1)
Basophils Relative: 0 %
Eosinophils Absolute: 0.1 10*3/uL (ref 0.0–0.5)
Eosinophils Relative: 1 %
HCT: 26 % — ABNORMAL LOW (ref 36.0–46.0)
Hemoglobin: 8.9 g/dL — ABNORMAL LOW (ref 12.0–15.0)
Immature Granulocytes: 1 %
Lymphocytes Relative: 19 %
Lymphs Abs: 1.1 10*3/uL (ref 0.7–4.0)
MCH: 35.2 pg — ABNORMAL HIGH (ref 26.0–34.0)
MCHC: 34.2 g/dL (ref 30.0–36.0)
MCV: 102.8 fL — ABNORMAL HIGH (ref 80.0–100.0)
Monocytes Absolute: 0.7 10*3/uL (ref 0.1–1.0)
Monocytes Relative: 12 %
Neutro Abs: 4 10*3/uL (ref 1.7–7.7)
Neutrophils Relative %: 67 %
Platelet Count: 158 10*3/uL (ref 150–400)
RBC: 2.53 MIL/uL — ABNORMAL LOW (ref 3.87–5.11)
RDW: 18.8 % — ABNORMAL HIGH (ref 11.5–15.5)
WBC Count: 5.9 10*3/uL (ref 4.0–10.5)
nRBC: 0 % (ref 0.0–0.2)

## 2020-10-27 LAB — SAMPLE TO BLOOD BANK

## 2020-10-27 MED ORDER — HEPARIN SOD (PORK) LOCK FLUSH 100 UNIT/ML IV SOLN: 500.0000 [IU] | Freq: Once | INTRAVENOUS | Status: AC

## 2020-10-27 MED ORDER — HEPARIN SOD (PORK) LOCK FLUSH 100 UNIT/ML IV SOLN
INTRAVENOUS | Status: AC
  Administered 2020-10-27: 500 [IU] via INTRAVENOUS
  Filled 2020-10-27: qty 5

## 2020-10-27 MED ORDER — IOHEXOL 300 MG/ML  SOLN
100.0000 mL | Freq: Once | INTRAMUSCULAR | Status: AC | PRN
  Administered 2020-10-27: 100 mL via INTRAVENOUS

## 2020-10-27 MED ORDER — SODIUM CHLORIDE 0.9% FLUSH
10.0000 mL | Freq: Once | INTRAVENOUS | Status: AC
  Administered 2020-10-27: 10 mL
  Filled 2020-10-27: qty 10

## 2020-10-27 NOTE — Assessment & Plan Note (Signed)
She has significant fluid retention likely secondary to prednisone I recommend resumption of her diuretic therapy We discussed the importance of dietary changes and reduce salt intake

## 2020-10-27 NOTE — Assessment & Plan Note (Addendum)
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 The soft tissue abnormalities in her pelvis is stable compared to prior CT imaging I suspect she has achieved maximum response to chemotherapy It is not clear whether the abnormality seen that is persistent on CT imaging represent residual disease versus scar tissue We discussed the risk and benefits of surveillance observation with imaging study in 3 months versus switching her to a maintenance treatment in the form of immunotherapy or targeted treatment with bevacizumab We discussed briefly the risk, benefits, side effects of bevacizumab versus lenvatinib with pembrolizumab Ultimately, she is undecided I felt like she is leaning to was taking a break We will plan to see her again in about 6 weeks for further follow-up physical examination and repeat blood work

## 2020-10-27 NOTE — Telephone Encounter (Signed)
Called [facility] and requested stat read on CT from this morning.

## 2020-10-27 NOTE — Progress Notes (Signed)
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 The soft tissue abnormalities in her pelvis is stable compared to prior CT imaging I suspect she has achieved maximum response to chemotherapy It is not clear whether the abnormality seen that is persistent on CT imaging represent residual disease versus scar tissue We discussed the risk and benefits of surveillance observation with imaging study in 3 months versus switching her to a maintenance treatment in the form of immunotherapy or targeted treatment with bevacizumab We discussed briefly the risk, benefits, side effects of bevacizumab versus lenvatinib with pembrolizumab Ultimately, she is undecided I felt like she is leaning to was taking a break We will plan to see her again in about 6 weeks for further follow-up physical examination and repeat blood work  Pancytopenia, acquired ([place]) She has mild persistent anemia since discontinuation of treatment likely secondary to side effects of therapy Observe for now  Peripheral neuropathy due to chemotherapy [facility]) She has residual peripheral neuropathy from prior treatment Observe for now  Bilateral lower extremity edema She has significant fluid retention likely secondary to prednisone I recommend resumption of her diuretic therapy We discussed the importance of dietary changes and reduce salt intake  Essential hypertension Her blood pressure is very high likely secondary to anxiety I think there is also a component of fluid retention from his steroid treatment As above, I recommend dietary change and diuretic therapy   No orders of the defined types were placed in this encounter.   All questions were answered. The  patient knows to call the clinic with any problems, questions or concerns. The total time spent in the appointment was 40 minutes encounter with patients including review of chart and various tests results, discussions about plan of care and coordination of care plan   [name], MD 10/27/2020 2:43 PM  INTERVAL HISTORY: Please see below for problem oriented charting. She returns with family for further follow-up She has some residual neuropathy from recent treatment Complain of fatigue She also noted bilateral lower extremity edema  SUMMARY OF ONCOLOGIC HISTORY: Oncology History Overview Note  ER/PR neg, MSI stable   Endometrial cancer ([place])  06/15/2018 Surgery   Surgeon: Donaciano Eva     Operation: Robotic-assisted laparoscopic total hysterectomy with bilateral salpingoophorectomy, SLN biopsy     Operative Findings:  : 6cm uterus, normal tubes and ovaries, omentum adherent to the anterior abdominal wall and uterine fundus. No suspicious nodes.   06/15/2018 Pathology Results   Procedure: Total hysterectomy with bilateral salpingo-oophorectomy. Bilateral obturator sentinel lymph node biopsies. Histologic type: Endometrioid adenocarcinoma. Histologic Grade: FIGO grade 1. Myometrial invasion: Depth of invasion: 15 mm Myometrial thickness: 20 mm Uterine Serosa Involvement: Not identified. Cervical stromal involvement: Not identified. Extent of involvement of other organs: Uninvolved. Lymphovascular invasion: Present. Regional Lymph Nodes: Examined: 2 Sentinel 0 Non-sentinel 2 Total Lymph nodes with metastasis: 0 Isolated tumor cells (< 0.2 mm): 2 Micrometastasis: (> 0.2 mm and < 2.0 mm): 0 Macrometastasis: (> 2.0 mm): 0 Extracapsular extension: N/A. Tumor block for ancillary studies: 4Q-S. MMR / MSI testing: Will be ordered. Pathologic Stage Classification (pTNM, AJCC 8th edition): pT1b, pN0(i+)   07/20/2018 Imaging   1. Surgical changes from recent hysterectomy. No  findings suspicious for residual  tumor or adenopathy in the pelvis. 2. No CT findings to suggest omental, peritoneal surface or solid organ metastatic disease in the abdomen. 3. Changes consistent with known sarcoidosis in the chest with bulky calcified mediastinal and hilar lymph nodes and interstitial lung disease. I do not see any definite CT findings to suggest metastatic disease involving the chest.   08/19/2018 - 09/17/2018 Radiation Therapy   08/19/18 - 09/17/18: Vaginal Cuff, 3 cm cylinder with treatment length of 3 cm / 30 Gy delivered in 5 fractions of 6 Gy, Brachytherapy / HDR, Iridium-192   05/2005: Left Chest Wall keloid   04/2004 - 05/2004: Anus, pelvis /inguinal ( Dr. Tammi Klippel)   01/19/2020 Imaging   1. No definite CT findings of the abdomen or pelvis to explain pain. 2. The sigmoid colon and rectum are decompressed although slightly thickened appearing, suggestive of nonspecific infectious, inflammatory, or ischemic colitis. Correlate for referable clinical symptoms. 3. There is trace, nonspecific free fluid in the low pelvis, which may be reactive, although would be difficult to distinguish from postoperative and post treatment soft tissue change in the pelvis given history of hysterectomy and rectal cancer. 4. Unchanged prominent retroperitoneal and iliac lymph nodes. No new findings suspicious for metastatic disease in the abdomen or pelvis. 5. Bibasilar pulmonary fibrosis and mediastinal and hilar lymphadenopathy, better evaluated by prior CT chest and consistent with patient diagnosis of sarcoidosis.       04/18/2020 Imaging   Venous Doppler Left:  - No evidence of deep vein thrombosis seen in the left lower extremity, from the common femoral through the popliteal veins.  - No evidence of superficial venous thrombosis in the left lower extremity.  - There is no evidence of venous reflux seen in the left lower extremity.  - Rouleax flow noted in the superficial system.    05/18/2020  PET scan   1. Complex scan with a combination of hypermetabolic granulomatous disease (sarcoidosis) as well as hypermetabolic metastatic endometrial carcinoma. 2. Hypermetabolic mediastinal hilar lymph nodes and pulmonary nodularity with bronchovascular thickening is consistent with sarcoidosis. 3. Solitary hypermetabolic nodule in the medial LEFT lower lobe, new from prior with intense metabolic activity, is concerning for a metastatic deposit to the LEFT lower lobe. 4. Ill-defined hypermetabolic soft tissue along the LEFT operator space is most consistent with local recurrence of endometrial carcinoma. There is entrapment of the LEFT ureter by this hypermetabolic soft tissue with mild hydroureter and hydronephrosis on the LEFT. 5. Additionally there appears to be direct extension from the hypermetabolic left operator space soft tissue to the sigmoid colon with there is a well-defined hypermetabolic lesion in the colon. 6. More distant hypermetabolic peritoneal metastasis along the ventral peritoneal surface of the RIGHT abdomen beneath RIGHT rectus abdominus muscle. 7. Hypermetabolic lymph nodes in the porta hepatis are favored related to sarcoidosis.   05/19/2020 Cancer Staging   Staging form: Corpus Uteri - Carcinoma and Carcinosarcoma, AJCC 8th Edition - Clinical stage from 05/19/2020: Stage IVB (rcT1, cN2a, cM1) - Signed by [name], MD on 05/19/2020   05/26/2020 Procedure   Successful placement of a right internal jugular approach power injectable Port-A-Cath. The catheter is ready for immediate use   05/29/2020 - 09/27/2020 Chemotherapy   The patient had carboplatin and taxol for chemotherapy treatment.     07/31/2020 Imaging   1. Asymmetric soft tissue mass extending from the left vaginal cuff has decreased in size in the interval. No residual peritoneal nodularity. Pulmonary metastases are stable with exception of a minimally  improved left lower lobe nodule. 2. Stable soft tissue nodule  associated with the right rectus abdominus muscle. 3. Interval resolution of left hydronephrosis. 4. Calcified mediastinal and hilar lymph nodes, together with pulmonary parenchymal fibrosis, findings which are in keeping with patient's known diagnosis of sarcoid. 5. Aortic atherosclerosis (ICD10-I70.0). Coronary artery calcification.   10/27/2020 Imaging   1. Essentially stable appearance of the soft tissue density nodule posteriorly in the left rectus abdominus muscle, currently 1.8 by 1.2 cm, previously 11 mm. 2. Stable calcified adenopathy in the mediastinum and hila, compatible with prior granulomatous disease. 3. Stable small right lower lobe pulmonary nodules. 4. Stable soft tissue density prominence along the left vaginal cuff extending up towards the adnexa, probably therapy related. 5. Other imaging findings of potential clinical significance: Coronary atherosclerosis. Small type 1 hiatal hernia. Stable subpleural reticulation favoring the lung bases, possibly from mild fibrosis. 6. Aortic atherosclerosis.   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       REVIEW OF SYSTEMS:   Constitutional: Denies fevers, chills or abnormal weight loss Eyes: Denies blurriness of vision Ears, nose, mouth, throat, and face: Denies mucositis or sore throat Respiratory: Denies cough, dyspnea or wheezes Cardiovascular: Denies palpitation, chest discomfort  Gastrointestinal:  Denies nausea, heartburn or change in bowel habits Skin: Denies  abnormal skin rashes Lymphatics: Denies new lymphadenopathy or easy bruising Behavioral/Psych: Mood is stable, no new changes  All other systems were reviewed with the patient and are negative.  I have reviewed the past medical history, past surgical history, social history and family history with the patient and they are unchanged from previous note.  ALLERGIES:  is allergic to shellfish allergy and penicillins.  MEDICATIONS:  Current Outpatient Medications  Medication Sig Dispense Refill  . albuterol (VENTOLIN HFA) 108 (90 Base) MCG/ACT inhaler Inhale 2 puffs into the lungs every 6 (six) hours as needed for wheezing or shortness of breath. Patient needs appt for further refills. 3 each 0  . amLODipine (NORVASC) 10 MG tablet Take 1 tablet (10 mg total) by mouth daily. 90 tablet 5  . bisoprolol (ZEBETA) 10 MG tablet Take 1 tablet (10 mg total) by mouth in the morning and at bedtime. 180 tablet 0  . budesonide-formoterol (SYMBICORT) 80-4.5 MCG/ACT inhaler Inhale 2 puffs into the lungs 2 (two) times daily. 3 Inhaler 3  . desonide (DESOWEN) 0.05 % lotion Apply topically 2 (two) times daily. Continue following with dermatologist 59 mL 0  . dexamethasone (DECADRON) 4 MG tablet Take 2 tabs at the night before and 2 tabs the morning of chemotherapy, every 3 weeks, by mouth 36 tablet 6  . dextromethorphan (DELSYM) 30 MG/5ML liquid Take by mouth as needed for cough.     . hydrocortisone 2.5 % cream Apply 1 application topically 2 (two) times daily.    Marland Kitchen lidocaine-prilocaine (EMLA) cream Apply to affected area once 30 g 3  . loratadine (CLARITIN) 10 MG tablet Take 10 mg by mouth daily as needed for allergies.    Marland Kitchen morphine (MSIR) 15 MG tablet Take 1 tablet (15 mg total)  by mouth every 4 (four) hours as needed for severe pain. 30 tablet 0  . ondansetron (ZOFRAN) 8 MG tablet Take 1 tablet (8 mg total) by mouth every 8 (eight) hours as needed for nausea. 30 tablet 3  . pantoprazole (PROTONIX) 40 MG tablet  Take 1 tablet (40 mg total) by mouth daily. Patient needs appt for further refills. 90 tablet 0  . predniSONE (DELTASONE) 10 MG tablet Take 0.5 tablets (5 mg total) by mouth daily with breakfast. Patient needs appt for further refills 90 tablet 0  . prochlorperazine (COMPAZINE) 10 MG tablet Take 1 tablet (10 mg total) by mouth every 6 (six) hours as needed for nausea or vomiting. 30 tablet 3  . RABEprazole (ACIPHEX) 20 MG tablet Take 1 tablet (20 mg total) by mouth daily before breakfast. 90 tablet 3  . vitamin C (ASCORBIC ACID) 500 MG tablet Take 500 mg by mouth daily.     No current facility-administered medications for this visit.    PHYSICAL EXAMINATION: ECOG PERFORMANCE STATUS: 1 - Symptomatic but completely ambulatory  Vitals:   10/27/20 1407  BP: (!) 181/85  Pulse: 90  Resp: 18  Temp: 98.6 F (37 C)  SpO2: 98%   Filed Weights   10/27/20 1407  Weight: 221 lb 6.4 oz (100.4 kg)    GENERAL:alert, no distress and comfortable HEART: She has moderate bilateral lower extremity edema NEURO: alert & oriented x 3 with fluent speech, no focal motor/sensory deficits  LABORATORY DATA:  I have reviewed the data as listed    Component Value Date/Time   NA 139 10/27/2020 0847   K 4.2 10/27/2020 0847   CL 105 10/27/2020 0847   CO2 22 10/27/2020 0847   GLUCOSE 125 (H) 10/27/2020 0847   BUN 22 10/27/2020 0847   CREATININE 0.84 10/27/2020 0847   CALCIUM 9.3 10/27/2020 0847   PROT 8.3 (H) 10/27/2020 0847   ALBUMIN 3.7 10/27/2020 0847   AST 24 10/27/2020 0847   ALT 15 10/27/2020 0847   ALKPHOS 103 10/27/2020 0847   BILITOT 0.5 10/27/2020 0847   GFRNONAA >60 10/27/2020 0847   GFRAA >60 09/08/2020 0819    No results found for: SPEP, UPEP  Lab Results  Component Value Date   WBC 5.9 10/27/2020   NEUTROABS 4.0 10/27/2020   HGB 8.9 (L) 10/27/2020   HCT 26.0 (L) 10/27/2020   MCV 102.8 (H) 10/27/2020   PLT 158 10/27/2020      Chemistry      Component Value Date/Time   NA  139 10/27/2020 0847   K 4.2 10/27/2020 0847   CL 105 10/27/2020 0847   CO2 22 10/27/2020 0847   BUN 22 10/27/2020 0847   CREATININE 0.84 10/27/2020 0847      Component Value Date/Time   CALCIUM 9.3 10/27/2020 0847   ALKPHOS 103 10/27/2020 0847   AST 24 10/27/2020 0847   ALT 15 10/27/2020 0847   BILITOT 0.5 10/27/2020 0847       RADIOGRAPHIC STUDIES: I have reviewed multiple CT imaging with the patient and family I have personally reviewed the radiological images as listed and agreed with the findings in the report. CT CHEST ABDOMEN PELVIS W CONTRAST  Result Date: 10/27/2020 CLINICAL DATA:  Restaging of uterine/cervical cancer, ongoing chemotherapy. Personal history of sarcoidosis. EXAM: CT CHEST, ABDOMEN, AND PELVIS WITH CONTRAST TECHNIQUE: Multidetector CT imaging of the chest, abdomen and pelvis was performed following the standard protocol during bolus administration of intravenous contrast. CONTRAST:  175m OMNIPAQUE IOHEXOL 300  MG/ML  SOLN COMPARISON:  Multiple exams, including 07/31/2020 FINDINGS: CT CHEST FINDINGS Cardiovascular: Right Port-A-Cath tip: Cavoatrial junction. Coronary, aortic arch, and branch vessel atherosclerotic vascular disease. Mediastinum/Nodes: Similar calcified adenopathy in the mediastinum and hila. Index calcified subcarinal node 2.1 cm in short axis on image 30 of series 2, stable. No substantial change in the visualized lymph nodes. Small type 1 hiatal hernia. Lungs/Pleura: Mild biapical pleuroparenchymal scarring. Stable mild scattered scarring in the lungs. Stable subpleural reticulation favoring the lung bases. Right lower lobe nodule 0.5 cm in diameter on image 94 of series 4, previously 0.7 by 0.6 cm. The other adjacent subpleural nodules are stable. 0.5 cm right lower lobe nodule on image 74 of series 4, previously 0.4 cm. Overall given the minute differences, nodularity is considered stable. Musculoskeletal: Thoracic spondylosis. CT ABDOMEN PELVIS  FINDINGS Hepatobiliary: Cholecystectomy.  Otherwise unremarkable. Pancreas: Unremarkable Spleen: Unremarkable Adrenals/Urinary Tract: Both adrenal glands appear normal. Stable 5 mm hypodense lesion in the left kidney upper pole on image 9 of series 7, probably a cyst or similar benign lesion but technically too small to characterize. Stomach/Bowel: Small type 1 hiatal hernia. Vascular/Lymphatic: Aortoiliac atherosclerotic vascular disease. Small right gastric, porta hepatis, and retroperitoneal lymph nodes are present. A right external iliac node measures 0.9 cm in short axis on image 104 of series 2, previously the same. The small retroperitoneal and pelvic lymph nodes are notable in number but not pathologically enlarged in size. Overall appearance is similar to prior. Reproductive: Uterus absent. Stable soft tissue density prominence along the left vaginal cuff extending up towards the adnexa, probably therapy related, with associated nodularity measuring about 10 mm in thickness on image 100 of series 2, previously 11 mm. Other: No significant recurrence of peritoneal nodules. Musculoskeletal: The soft tissue density nodule posteriorly in the rectus abdominus muscle has similar density to the surrounding muscle but measures about 1.8 by 1.2 cm on image 80 of series 2, previously by my measurements 1.9 by 1.2 cm. Stable chronic mild deformity of the right iliac wing. IMPRESSION: 1. Essentially stable appearance of the soft tissue density nodule posteriorly in the left rectus abdominus muscle, currently 1.8 by 1.2 cm, previously 11 mm. 2. Stable calcified adenopathy in the mediastinum and hila, compatible with prior granulomatous disease. 3. Stable small right lower lobe pulmonary nodules. 4. Stable soft tissue density prominence along the left vaginal cuff extending up towards the adnexa, probably therapy related. 5. Other imaging findings of potential clinical significance: Coronary atherosclerosis. Small type 1  hiatal hernia. Stable subpleural reticulation favoring the lung bases, possibly from mild fibrosis. 6. Aortic atherosclerosis. Aortic Atherosclerosis (ICD10-I70.0). Electronically Signed   By: Van Clines M.D.   On: 10/27/2020 13:35

## 2020-10-27 NOTE — Assessment & Plan Note (Signed)
Her blood pressure is very high likely secondary to anxiety I think there is also a component of fluid retention from his steroid treatment As above, I recommend dietary change and diuretic therapy

## 2020-10-27 NOTE — Telephone Encounter (Signed)
Scheduled apt per 11/12 sch msg - gave patient AVS and calender per sch msg

## 2020-10-27 NOTE — Assessment & Plan Note (Signed)
She has residual peripheral neuropathy from prior treatment Observe for now

## 2020-10-27 NOTE — Assessment & Plan Note (Signed)
She has mild persistent anemia since discontinuation of treatment likely secondary to side effects of therapy Observe for now

## 2020-10-31 ENCOUNTER — Telehealth: Payer: Self-pay

## 2020-10-31 NOTE — Telephone Encounter (Signed)
She called and left a message to call her.  Called back. She is concerned that her next appt is too long out from now. She was unable to make a decision at her last appt and was overwhelmed information. She did not hear everything that was said in the appt. She is asking what is the difference in the 2 treatment options? Is she waiting too long to make a decision, with her appt on 12/27?  Should I offer her appt?

## 2020-11-01 NOTE — Telephone Encounter (Signed)
Kathryne Hitch and scheduled appointment with Dr .Alvy Bimler tomorrow at 1:45.  She will have her family (sister) attend the appointment with her.

## 2020-11-01 NOTE — Telephone Encounter (Signed)
Hi [name],  Can you call? I can see her tomorrow from 145 to 215, but make sure family is present to discuss her options again

## 2020-11-02 ENCOUNTER — Inpatient Hospital Stay: Payer: Medicare HMO | Admitting: Hematology and Oncology

## 2020-11-02 ENCOUNTER — Encounter: Payer: Self-pay | Admitting: Hematology and Oncology

## 2020-11-02 ENCOUNTER — Other Ambulatory Visit: Payer: Self-pay

## 2020-11-02 DIAGNOSIS — Z7189 Other specified counseling: Secondary | ICD-10-CM | POA: Diagnosis not present

## 2020-11-02 DIAGNOSIS — I10 Essential (primary) hypertension: Secondary | ICD-10-CM

## 2020-11-02 DIAGNOSIS — C541 Malignant neoplasm of endometrium: Secondary | ICD-10-CM

## 2020-11-02 DIAGNOSIS — R609 Edema, unspecified: Secondary | ICD-10-CM | POA: Diagnosis not present

## 2020-11-02 DIAGNOSIS — G620 Drug-induced polyneuropathy: Secondary | ICD-10-CM | POA: Diagnosis not present

## 2020-11-02 DIAGNOSIS — D61818 Other pancytopenia: Secondary | ICD-10-CM | POA: Diagnosis not present

## 2020-11-02 NOTE — Assessment & Plan Note (Signed)
We have significant discussions about goals of treatment and goals of care Ultimately, she is in agreement for treatment break I will see her back next month for further follow-up

## 2020-11-02 NOTE — Assessment & Plan Note (Signed)
Her blood pressure is very high likely secondary to anxiety I think there is also a component of fluid retention  I recommend dietary change and close monitoring of blood pressure at home

## 2020-11-02 NOTE — Assessment & Plan Note (Signed)
We discussed results of her recent blood work and imaging studies I reviewed with the patient the rationale for treatment break versus maintenance treatment We discussed the risk and benefits of each option The patient is instructed to pay attention to new abdominal symptoms over the next few weeks She is recommended to call if she have new onset of bloating, nausea, changes in bowel habits or pain After much discussion, she is in agreement for observation with close surveillance and follow-up

## 2020-11-02 NOTE — Progress Notes (Signed)
[facility] OFFICE PROGRESS NOTE  Patient Care Team: [name], [name], MD as PCP - General (Family Medicine)  ASSESSMENT & PLAN:  Endometrial cancer [facility]) We discussed results of her recent blood work and imaging studies I reviewed with the patient the rationale for treatment break versus maintenance treatment We discussed the risk and benefits of each option The patient is instructed to pay attention to new abdominal symptoms over the next few weeks She is recommended to call if she have new onset of bloating, nausea, changes in bowel habits or pain After much discussion, she is in agreement for observation with close surveillance and follow-up  Essential hypertension Her blood pressure is very high likely secondary to anxiety I think there is also a component of fluid retention  I recommend dietary change and close monitoring of blood pressure at home  Goals of care, counseling/discussion We have significant discussions about goals of treatment and goals of care Ultimately, she is in agreement for treatment break I will see her back next month for further follow-up   No orders of the defined types were placed in this encounter.   All questions were answered. The patient knows to call the clinic with any problems, questions or concerns. The total time spent in the appointment was 20 minutes encounter with patients including review of chart and various tests results, discussions about plan of care and coordination of care plan   [name], MD 11/02/2020 3:13 PM  INTERVAL HISTORY: Please see below for problem oriented charting. She is seen urgently per patient request She is very distressed since her last visit She could not remember what much of our discussion from her last visit and is concerned about not being treated She had no new symptoms since last time I saw her Her daughter is available in person with her Her sister is available over the phone She  needed to understand the findings on CT imaging, the rationale of taking a chemotherapy break versus more treatment right now  SUMMARY OF ONCOLOGIC HISTORY: Oncology History Overview Note  ER/PR neg, MSI stable   Endometrial cancer ([place])  06/15/2018 Surgery   Surgeon: Donaciano Eva     Operation: Robotic-assisted laparoscopic total hysterectomy with bilateral salpingoophorectomy, SLN biopsy     Operative Findings:  : 6cm uterus, normal tubes and ovaries, omentum adherent to the anterior abdominal wall and uterine fundus. No suspicious nodes.   06/15/2018 Pathology Results   Procedure: Total hysterectomy with bilateral salpingo-oophorectomy. Bilateral obturator sentinel lymph node biopsies. Histologic type: Endometrioid adenocarcinoma. Histologic Grade: FIGO grade 1. Myometrial invasion: Depth of invasion: 15 mm Myometrial thickness: 20 mm Uterine Serosa Involvement: Not identified. Cervical stromal involvement: Not identified. Extent of involvement of other organs: Uninvolved. Lymphovascular invasion: Present. Regional Lymph Nodes: Examined: 2 Sentinel 0 Non-sentinel 2 Total Lymph nodes with metastasis: 0 Isolated tumor cells (< 0.2 mm): 2 Micrometastasis: (> 0.2 mm and < 2.0 mm): 0 Macrometastasis: (> 2.0 mm): 0 Extracapsular extension: N/A. Tumor block for ancillary studies: 4Q-S. MMR / MSI testing: Will be ordered. Pathologic Stage Classification (pTNM, AJCC 8th edition): pT1b, pN0(i+)   07/20/2018 Imaging   1. Surgical changes from recent hysterectomy. No findings suspicious for residual tumor or adenopathy in the pelvis. 2. No CT findings to suggest omental, peritoneal surface or solid organ metastatic disease in the abdomen. 3. Changes consistent with known sarcoidosis in the chest with bulky calcified mediastinal and hilar lymph nodes and interstitial lung disease. I do not see  any definite CT findings to suggest metastatic disease involving the chest.   08/19/2018 -  09/17/2018 Radiation Therapy   08/19/18 - 09/17/18: Vaginal Cuff, 3 cm cylinder with treatment length of 3 cm / 30 Gy delivered in 5 fractions of 6 Gy, Brachytherapy / HDR, Iridium-192   05/2005: Left Chest Wall keloid   04/2004 - 05/2004: Anus, pelvis /inguinal ( Dr. Tammi Klippel)   01/19/2020 Imaging   1. No definite CT findings of the abdomen or pelvis to explain pain. 2. The sigmoid colon and rectum are decompressed although slightly thickened appearing, suggestive of nonspecific infectious, inflammatory, or ischemic colitis. Correlate for referable clinical symptoms. 3. There is trace, nonspecific free fluid in the low pelvis, which may be reactive, although would be difficult to distinguish from postoperative and post treatment soft tissue change in the pelvis given history of hysterectomy and rectal cancer. 4. Unchanged prominent retroperitoneal and iliac lymph nodes. No new findings suspicious for metastatic disease in the abdomen or pelvis. 5. Bibasilar pulmonary fibrosis and mediastinal and hilar lymphadenopathy, better evaluated by prior CT chest and consistent with patient diagnosis of sarcoidosis.       04/18/2020 Imaging   Venous Doppler Left:  - No evidence of deep vein thrombosis seen in the left lower extremity, from the common femoral through the popliteal veins.  - No evidence of superficial venous thrombosis in the left lower extremity.  - There is no evidence of venous reflux seen in the left lower extremity.  - Rouleax flow noted in the superficial system.    05/18/2020 PET scan   1. Complex scan with a combination of hypermetabolic granulomatous disease (sarcoidosis) as well as hypermetabolic metastatic endometrial carcinoma. 2. Hypermetabolic mediastinal hilar lymph nodes and pulmonary nodularity with bronchovascular thickening is consistent with sarcoidosis. 3. Solitary hypermetabolic nodule in the medial LEFT lower lobe, new from prior with intense metabolic activity, is concerning  for a metastatic deposit to the LEFT lower lobe. 4. Ill-defined hypermetabolic soft tissue along the LEFT operator space is most consistent with local recurrence of endometrial carcinoma. There is entrapment of the LEFT ureter by this hypermetabolic soft tissue with mild hydroureter and hydronephrosis on the LEFT. 5. Additionally there appears to be direct extension from the hypermetabolic left operator space soft tissue to the sigmoid colon with there is a well-defined hypermetabolic lesion in the colon. 6. More distant hypermetabolic peritoneal metastasis along the ventral peritoneal surface of the RIGHT abdomen beneath RIGHT rectus abdominus muscle. 7. Hypermetabolic lymph nodes in the porta hepatis are favored related to sarcoidosis.   05/19/2020 Cancer Staging   Staging form: Corpus Uteri - Carcinoma and Carcinosarcoma, AJCC 8th Edition - Clinical stage from 05/19/2020: Stage IVB (rcT1, cN2a, cM1) - Signed by [name], MD on 05/19/2020   05/26/2020 Procedure   Successful placement of a right internal jugular approach power injectable Port-A-Cath. The catheter is ready for immediate use   05/29/2020 - 09/27/2020 Chemotherapy   The patient had carboplatin and taxol for chemotherapy treatment.     07/31/2020 Imaging   1. Asymmetric soft tissue mass extending from the left vaginal cuff has decreased in size in the interval. No residual peritoneal nodularity. Pulmonary metastases are stable with exception of a minimally improved left lower lobe nodule. 2. Stable soft tissue nodule associated with the right rectus abdominus muscle. 3. Interval resolution of left hydronephrosis. 4. Calcified mediastinal and hilar lymph nodes, together with pulmonary parenchymal fibrosis, findings which are in keeping with patient's known diagnosis of sarcoid. 5.  Aortic atherosclerosis (ICD10-I70.0). Coronary artery calcification.   10/27/2020 Imaging   1. Essentially stable appearance of the soft tissue density  nodule posteriorly in the left rectus abdominus muscle, currently 1.8 by 1.2 cm, previously 11 mm. 2. Stable calcified adenopathy in the mediastinum and hila, compatible with prior granulomatous disease. 3. Stable small right lower lobe pulmonary nodules. 4. Stable soft tissue density prominence along the left vaginal cuff extending up towards the adnexa, probably therapy related. 5. Other imaging findings of potential clinical significance: Coronary atherosclerosis. Small type 1 hiatal hernia. Stable subpleural reticulation favoring the lung bases, possibly from mild fibrosis. 6. Aortic atherosclerosis.   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       REVIEW OF SYSTEMS:   Constitutional: Denies fevers, chills or abnormal weight loss Eyes: Denies blurriness of vision Ears, nose, mouth, throat, and face: Denies mucositis or sore throat Respiratory: Denies cough, dyspnea or wheezes Cardiovascular: Denies palpitation, chest discomfort or lower extremity swelling Gastrointestinal:  Denies nausea, heartburn or change in bowel habits Skin: Denies abnormal skin rashes Lymphatics: Denies new lymphadenopathy or easy bruising Neurological:Denies numbness, tingling or new weaknesses Behavioral/Psych: Mood is stable, no new changes  All other systems were reviewed with the patient and are negative.  I have reviewed the past medical history, past surgical history, social history and family history with the patient and they are unchanged from  previous note.  ALLERGIES:  is allergic to shellfish allergy and penicillins.  MEDICATIONS:  Current Outpatient Medications  Medication Sig Dispense Refill  . albuterol (VENTOLIN HFA) 108 (90 Base) MCG/ACT inhaler Inhale 2 puffs into the lungs every 6 (six) hours as needed for wheezing or shortness of breath. Patient needs appt for further refills. 3 each 0  . amLODipine (NORVASC) 10 MG tablet Take 1 tablet (10 mg total) by mouth daily. 90 tablet 5  . bisoprolol (ZEBETA) 10 MG tablet Take 1 tablet (10 mg total) by mouth in the morning and at bedtime. 180 tablet 0  . budesonide-formoterol (SYMBICORT) 80-4.5 MCG/ACT inhaler Inhale 2 puffs into the lungs 2 (two) times daily. 3 Inhaler 3  . desonide (DESOWEN) 0.05 % lotion Apply topically 2 (two) times daily. Continue following with dermatologist 59 mL 0  . dexamethasone (DECADRON) 4 MG tablet Take 2 tabs at the night before and 2 tabs the morning of chemotherapy, every 3 weeks, by mouth 36 tablet 6  . dextromethorphan (DELSYM) 30 MG/5ML liquid Take by mouth as needed for cough.     . hydrocortisone 2.5 % cream Apply 1 application topically 2 (two) times daily.    Marland Kitchen lidocaine-prilocaine (EMLA) cream Apply to affected area once 30 g 3  . loratadine (CLARITIN) 10 MG tablet Take 10 mg by mouth daily as needed for allergies.    Marland Kitchen morphine (MSIR) 15 MG tablet Take 1 tablet (15 mg total) by mouth every 4 (four) hours as needed for severe pain. 30 tablet 0  . ondansetron (ZOFRAN) 8 MG tablet Take 1 tablet (8 mg total) by mouth every 8 (eight) hours as needed for nausea. [place]  tablet 3  . pantoprazole (PROTONIX) 40 MG tablet Take 1 tablet (40 mg total) by mouth daily. Patient needs appt for further refills. 90 tablet 0  . predniSONE (DELTASONE) 10 MG tablet Take 0.5 tablets (5 mg total) by mouth daily with breakfast. Patient needs appt for further refills 90 tablet 0  . prochlorperazine (COMPAZINE) 10 MG tablet Take 1 tablet (10 mg total) by mouth every 6  (six) hours as needed for nausea or vomiting. 30 tablet 3  . RABEprazole (ACIPHEX) 20 MG tablet Take 1 tablet (20 mg total) by mouth daily before breakfast. 90 tablet 3  . vitamin C (ASCORBIC ACID) 500 MG tablet Take 500 mg by mouth daily.     No current facility-administered medications for this visit.    PHYSICAL EXAMINATION: ECOG PERFORMANCE STATUS: 1 - Symptomatic but completely ambulatory  Vitals:   11/02/20 1412  BP: (!) 179/91  Pulse: (!) 103  Resp: 18  Temp: (!) 97.4 F (36.3 C)  SpO2: 98%   Filed Weights   11/02/20 1412  Weight: 219 lb 12.8 oz (99.7 kg)    GENERAL:alert, no distress and comfortable  Musculoskeletal:no cyanosis of digits and no clubbing  NEURO: alert & oriented x 3 with fluent speech, no focal motor/sensory deficits  LABORATORY DATA:  I have reviewed the data as listed    Component Value Date/Time   NA 139 10/27/2020 0847   K 4.2 10/27/2020 0847   CL 105 10/27/2020 0847   CO2 22 10/27/2020 0847   GLUCOSE 125 (H) 10/27/2020 0847   BUN 22 10/27/2020 0847   CREATININE 0.84 10/27/2020 0847   CALCIUM 9.3 10/27/2020 0847   PROT 8.3 (H) 10/27/2020 0847   ALBUMIN 3.7 10/27/2020 0847   AST 24 10/27/2020 0847   ALT 15 10/27/2020 0847   ALKPHOS 103 10/27/2020 0847   BILITOT 0.5 10/27/2020 0847   GFRNONAA >60 10/27/2020 0847   GFRAA >60 09/08/2020 0819    No results found for: SPEP, UPEP  Lab Results  Component Value Date   WBC 5.9 10/27/2020   NEUTROABS 4.0 10/27/2020   HGB 8.9 (L) 10/27/2020   HCT 26.0 (L) 10/27/2020   MCV 102.8 (H) 10/27/2020   PLT 158 10/27/2020      Chemistry      Component Value Date/Time   NA 139 10/27/2020 0847   K 4.2 10/27/2020 0847   CL 105 10/27/2020 0847   CO2 22 10/27/2020 0847   BUN 22 10/27/2020 0847   CREATININE 0.84 10/27/2020 0847      Component Value Date/Time   CALCIUM 9.3 10/27/2020 0847   ALKPHOS 103 10/27/2020 0847   AST 24 10/27/2020 0847   ALT 15 10/27/2020 0847   BILITOT 0.5  10/27/2020 0847       RADIOGRAPHIC STUDIES: I have personally reviewed the radiological images as listed and agreed with the findings in the report. CT CHEST ABDOMEN PELVIS W CONTRAST  Result Date: 10/27/2020 CLINICAL DATA:  Restaging of uterine/cervical cancer, ongoing chemotherapy. Personal history of sarcoidosis. EXAM: CT CHEST, ABDOMEN, AND PELVIS WITH CONTRAST TECHNIQUE: Multidetector CT imaging of the chest, abdomen and pelvis was performed following the standard protocol during bolus administration of intravenous contrast. CONTRAST:  141m OMNIPAQUE IOHEXOL 300 MG/ML  SOLN COMPARISON:  Multiple exams, including 07/31/2020 FINDINGS: CT CHEST FINDINGS Cardiovascular: Right Port-A-Cath tip: Cavoatrial junction. Coronary, aortic arch, and branch vessel atherosclerotic vascular disease. Mediastinum/Nodes: Similar calcified adenopathy in the mediastinum and hila. Index calcified subcarinal node 2.1 cm in short axis  on image 30 of series 2, stable. No substantial change in the visualized lymph nodes. Small type 1 hiatal hernia. Lungs/Pleura: Mild biapical pleuroparenchymal scarring. Stable mild scattered scarring in the lungs. Stable subpleural reticulation favoring the lung bases. Right lower lobe nodule 0.5 cm in diameter on image 94 of series 4, previously 0.7 by 0.6 cm. The other adjacent subpleural nodules are stable. 0.5 cm right lower lobe nodule on image 74 of series 4, previously 0.4 cm. Overall given the minute differences, nodularity is considered stable. Musculoskeletal: Thoracic spondylosis. CT ABDOMEN PELVIS FINDINGS Hepatobiliary: Cholecystectomy.  Otherwise unremarkable. Pancreas: Unremarkable Spleen: Unremarkable Adrenals/Urinary Tract: Both adrenal glands appear normal. Stable 5 mm hypodense lesion in the left kidney upper pole on image 9 of series 7, probably a cyst or similar benign lesion but technically too small to characterize. Stomach/Bowel: Small type 1 hiatal hernia.  Vascular/Lymphatic: Aortoiliac atherosclerotic vascular disease. Small right gastric, porta hepatis, and retroperitoneal lymph nodes are present. A right external iliac node measures 0.9 cm in short axis on image 104 of series 2, previously the same. The small retroperitoneal and pelvic lymph nodes are notable in number but not pathologically enlarged in size. Overall appearance is similar to prior. Reproductive: Uterus absent. Stable soft tissue density prominence along the left vaginal cuff extending up towards the adnexa, probably therapy related, with associated nodularity measuring about 10 mm in thickness on image 100 of series 2, previously 11 mm. Other: No significant recurrence of peritoneal nodules. Musculoskeletal: The soft tissue density nodule posteriorly in the rectus abdominus muscle has similar density to the surrounding muscle but measures about 1.8 by 1.2 cm on image 80 of series 2, previously by my measurements 1.9 by 1.2 cm. Stable chronic mild deformity of the right iliac wing. IMPRESSION: 1. Essentially stable appearance of the soft tissue density nodule posteriorly in the left rectus abdominus muscle, currently 1.8 by 1.2 cm, previously 11 mm. 2. Stable calcified adenopathy in the mediastinum and hila, compatible with prior granulomatous disease. 3. Stable small right lower lobe pulmonary nodules. 4. Stable soft tissue density prominence along the left vaginal cuff extending up towards the adnexa, probably therapy related. 5. Other imaging findings of potential clinical significance: Coronary atherosclerosis. Small type 1 hiatal hernia. Stable subpleural reticulation favoring the lung bases, possibly from mild fibrosis. 6. Aortic atherosclerosis. Aortic Atherosclerosis (ICD10-I70.0). Electronically Signed   By: Van Clines M.D.   On: 10/27/2020 13:35

## 2020-11-07 ENCOUNTER — Other Ambulatory Visit: Payer: Self-pay

## 2020-11-07 ENCOUNTER — Encounter: Payer: Self-pay | Admitting: Internal Medicine

## 2020-11-07 ENCOUNTER — Ambulatory Visit: Payer: Medicare HMO | Admitting: Internal Medicine

## 2020-11-07 DIAGNOSIS — D869 Sarcoidosis, unspecified: Secondary | ICD-10-CM

## 2020-11-07 DIAGNOSIS — R058 Other specified cough: Secondary | ICD-10-CM | POA: Diagnosis not present

## 2020-11-07 NOTE — Progress Notes (Signed)
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   Last worked  Prior to  [facility] surgery Nov 28 2017      History of Present Illness  Last ov 02/17/13 no evidence active sarcoid rec Aciphex 20 mg Take 30-60 min before first meal of the day also when coughing for any reason take Pepcid 20 mg one at bedtime  Take delsym two tsp every 12 hours and supplement if needed with  tramadol 50 mg up to 2 every 4 hours to suppress the urge to cough. Swallowing water or using ice chips/non mint and menthol containing candies (such as lifesavers or sugarless jolly ranchers) are also effective.  You should rest your voice and avoid activities that you know make you cough. Once you have eliminated the cough for 3 straight days try reducing the tramadol first,  then the delsym as tolerated.   GERD diet    01/28/2014  Pulmonary consultation / Diksha Tagliaferro/ on no maint resp rx / rare baseline need for saba  Chief Complaint  Patient presents with   Pulmonary Consult    Referred per Dr. Posey Pronto. [name] Cough is prod with moderate yellow sputum. She is using proair approx tid.  Onset was Jan 01 2014 while on aciphex with bfast with flu like illness but not follow contingency rx  On prednisone taper/ sp tamiflu/cough meds  Not limited by breathing from desired activities   rec Aciphex 20 mg Take 30-60 min before first meal of the day also when coughing for any reason take Pepcid 20 mg one at bedtime and chlortrimeton  4 mg at bedtime Take delsym two tsp every 12 hours and supplement if needed with  tramadol 50 mg up to 2 every 4 hours to suppress the urge to cough.   Once you have eliminated the cough for 3 straight days try reducing the tramadol first,  then the delsym as tolerated.   GERD diet         05/07/2017 acute extended ov/Dossie Ocanas re: cough > sob  Chief Complaint  Patient presents with   Acute Visit    Cough not improving, non prod "feels like something in my throat". She is waking up in the night coughing.   was doing fine for months since last ov with me but not doing well as well since 02/18/17 when placed on lisinopril and gradually  downhill since on multple courses of prednisone and now symbicort (though very poor hfa) and much worse dry cough 24/7 no longer has med calendar and doesn't even recognize the copy we have / very confused with meds  rec Stop lotensin and start valasartan 325 mg one daily  Start amlodipine 10 mg daily  Work on inhaler technique:  albuterol as a rescue medication to be used if you can't catch your breath For cough use the flutter valve and Take delsym two tsp every 12 hours and supplement if needed with  tramadol 50 mg up to 2 every 4 hours to suppress the urge  to cough   Finish your prednisone Follow the med calendar daily and bring it back with you to all office visits   Surgery 09/23/17 [name] / [facility]:  - ANGIOKERATOMA.  10/21/2017  f/u ov/Rogue Pautler re:  Sarcoid/ steroid dep / ocular involvmemnt  Chief Complaint  Patient presents with   Follow-up    Increased cough x 2 wks- non prod. Her breathing is unchanged. She ran out of prednisone approx 2 wks ago and never had it refilled.   confused re details of care  ? 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 Sarcoid on pred 5 mg daily p no better on 20 mg daily  Chief Complaint  Patient presents with   Follow-up    PFT's done today. Her breathing is some better since the last visit. She states she has good days and bad days. She is using her albuterol inhaler 3 x daily on average.   Dyspnea:  On days when use Proair,  Does gxt ? Doesn't  Know speed/ slt tilted x 3 min use when not using inhaler/ walks on track x 5 min slow pace Cough: none Sleeping: 30 degrees hob SABA use:  Usually not needing at night but this am she felt needed saba  prior to pfts 02: no rec Start dulera 100 Take 2 puffs first thing in am and then another 2 puffs about 12 hours later.  Work on inhaler technique:   Only use your albuterol (proair) as a rescue  Please schedule a follow up office visit in 2  weeks, sooner if needed  with all medications /inhalers/ solutions in hand    01/25/2019  f/u ov/Rami Waddle re: sarcoid / on pred 5mg  daily / maint on dulera 100 2bid though counts are off (has 40 left, should be 12 )/ hbp with leg swelling on amlodipine  Chief Complaint  Patient presents with   Follow-up    Pt c/o nasal congestion and HA x 2 days.   Dyspnea:  X 50 ft and can't do steps  Cough: daytime dry/ sporadic Sleeping: 30 degrees electric bed SABA use: much less  02: none  rec Dulera 100 Take 2 puffs first thing in am and then another 2 puffs about 12 hours later.  Work on inhaler technique:  Only use your albuterol as a rescue medication Gabapentin 100 mg three times a day  Change your appt insteady of me see [name] w/in 4  weeks with all your medications Add : advised to d/c amlodipine altogether    06/29/2019  f/u ov/Sundi Slevin re: sarcoid with prob airways involvement/ no med calendar, confused with instructions, pharmacy keeps sending her meds she thinks  we stopped  maint on dulera 100 and pred 5 mg daily  Chief Complaint  Patient presents with   Follow-up    Breathing is unchanged. She has not used her rescue inhaler.   Dyspnea:  Walks one  block then oob half way back and has to stop but this is better than prev Cough: none  Sleeping: ok at 30 deg SABA use: none  02: none rec If cough gets worse, add pantoprazole 40mg  take one 30-60 min before supper Dulera 100 = symbicort 80  See calendar for specific medication instructions    09/30/2019  f/u ov/Bernardina Cacho re: sarcoid/ ? Airways involvement / symb 80 2bid pred 5 mg Chief Complaint  Patient presents with   Follow-up  Dyspnea:  Walks end of street and back up hill to house makes sob but not oob  - does not check sats  Cough: none Sleeping: 30 degrees x years SABA use: rarely  02: none  rec Work on inhaler technique:     To get the most out of exercise, you need to be continuously aware that you are short of breath, but never out of breath, for 30 minutes daily.   See calendar for specific medication instructions   11/07/2020  f/u ov/Kenlee Maler re: sarcoid with ? Airway involvement on pred 10 mg one half daily  Chief Complaint  Patient presents with   Follow-up    Breathing is overall doing well and she denies any new co's today.   Dyspnea: not as active  Cough: none  Sleeping: 30 degrees fine  SABA use: none  02: none    No obvious day to day or daytime variability or assoc excess/ purulent sputum or mucus plugs or hemoptysis or cp or chest tightness, subjective wheeze or overt sinus or hb symptoms.   Sleeping as above  without nocturnal  or early am exacerbation  of respiratory  c/o's or need for noct saba. Also denies any obvious fluctuation of symptoms with weather or environmental changes or other aggravating or alleviating factors except as outlined above   No unusual exposure hx or h/o childhood pna/ asthma or knowledge of premature birth.  Current Allergies, Complete  Past Medical History, Past Surgical History, Family History, and Social History were reviewed in [employer] record.  ROS  The following are not active complaints unless bolded Hoarseness, sore throat, dysphagia, dental problems, itching, sneezing,  nasal congestion or discharge of excess mucus or purulent secretions, ear ache,   fever, chills, sweats, unintended wt loss or wt gain, classically pleuritic or exertional cp,  orthopnea pnd or arm/hand swelling  or leg swelling, presyncope, palpitations, abdominal pain, anorexia, nausea, vomiting, diarrhea  or change in bowel habits or change in bladder habits, change in stools or change in urine, dysuria, hematuria,  rash, arthralgias, visual complaints, headache, numbness, weakness or ataxia or problems with walking or coordination,  change in mood or  memory.        Current Meds  Medication Sig   albuterol (VENTOLIN HFA) 108 (90 Base) MCG/ACT inhaler Inhale 2 puffs into the lungs every 6 (six) hours as needed for wheezing or shortness of breath. Patient needs appt for further refills.   amLODipine (NORVASC) 10 MG tablet Take 1 tablet (10 mg total) by mouth daily.   bisoprolol (ZEBETA) 10 MG tablet Take 1 tablet (10 mg total) by mouth in the morning and at bedtime.   budesonide-formoterol (SYMBICORT) 80-4.5 MCG/ACT inhaler Inhale 2 puffs into the lungs 2 (two) times daily.   desonide (DESOWEN) 0.05 % lotion Apply topically 2 (two) times daily. Continue following with dermatologist   dextromethorphan (DELSYM) 30 MG/5ML liquid Take by mouth as needed for cough.    hydrocortisone 2.5 % cream Apply 1 application topically 2 (two) times daily.   loratadine (CLARITIN) 10 MG tablet Take 10 mg by mouth  daily as needed for allergies.   ondansetron (ZOFRAN) 8 MG tablet Take 1 tablet (8 mg total) by mouth every 8 (eight) hours as needed for nausea.   pantoprazole (PROTONIX) 40 MG tablet Take 1 tablet (40 mg total) by mouth daily.  Patient needs appt for further refills.   predniSONE (DELTASONE) 10 MG tablet Take 0.5 tablets (5 mg total) by mouth daily with breakfast. Patient needs appt for further refills   prochlorperazine (COMPAZINE) 10 MG tablet Take 1 tablet (10 mg total) by mouth every 6 (six) hours as needed for nausea or vomiting.   RABEprazole (ACIPHEX) 20 MG tablet Take 1 tablet (20 mg total) by mouth daily before breakfast.   vitamin C (ASCORBIC ACID) 500 MG tablet Take 500 mg by mouth daily.                  Objective:   Physical Exam      11/07/2020    221 09/30/2019    233  06/29/2019      231 01/25/2019      228   01/14/2019      229  10/23/2018      229  09/17/2018      227  06/16/2018        225  10/21/2017      224  07/07/2017      223  05/22/2017         219  05/07/2017       222  08/22/2016         216   05/17/2016        215   05/02/15 219 lb (99.338 kg)  04/07/15 219 lb 9.6 oz (99.61 kg)  03/24/15 216 lb (97.977 kg)      amb obese pleasant bf nad  Vital signs reviewed  11/07/2020  - Note at rest 02 sats  99% on RA     HEENT : pt wearing mask not removed for exam due to covid -19 concerns.    NECK :  without JVD/Nodes/TM/ nl carotid upstrokes bilaterally   LUNGS: no acc muscle use,  Nl contour chest which is clear to A and P bilaterally without cough on insp or exp maneuvers   CV:  RRR  no s3 or murmur or increase in P2, and no edema   ABD:  soft and nontender with nl inspiratory excursion in the supine position. No bruits or organomegaly appreciated, bowel sounds nl  MS:  Nl gait/ ext warm without deformities, calf tenderness, cyanosis or clubbing No obvious joint restrictions   SKIN: warm and dry without lesions    NEURO:  alert, approp, nl sensorium with  no motor or cerebellar deficits apparent.      I personally reviewed images and agree with radiology impression as follows:   Chest CT (part of abd/pelvis)  10/27/20  No evidence active sarcoid.               Assessment & Plan:

## 2020-11-07 NOTE — Patient Instructions (Addendum)
Ok to try skipping a dose of prednisone  every other day to see what difference if any notice    Please schedule a follow up visit in 6  months but call sooner if needed

## 2020-11-08 ENCOUNTER — Encounter: Payer: Self-pay | Admitting: Internal Medicine

## 2020-11-08 NOTE — Assessment & Plan Note (Signed)
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 05/02/2015 >>> - flare May 2017 p stopped aciphex > rec restart 05/17/2016  - flare since 02/2017 placed on lisinopril with no airflow obst 05/07/2017 by spirometry > d/c ACEi  Permanently  - improved 07/07/2017 but not on gerd rx > rec resume full gerd rx/ stay off acei  - 06/29/2019  After extensive coaching inhaler device,  effectiveness =    75% short ti but can't afford dulera 100 so change to symb 80> cough resolved as of 09/30/2019   All goals of chronic asthma control met including optimal function and elimination of symptoms with minimal need for rescue therapy.  Contingencies discussed in full including contacting this office immediately if not controlling the symptoms using the rule of two's.     Cough rsolved to her satisfaction on present rx

## 2020-11-08 NOTE — Assessment & Plan Note (Signed)
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  - Chronic pred rx 2/09> 04/2008  - Restart Pred December 26, 2008 > June 12, 2009 stopped  > restarted 07/11/11>>pt stopped 10/15/11  - PFT's March 23, 2010 VC 1.77 (66%) no obst, ERV 34%, DLCO 53 > 115% corrected - PFT's 09/30/12       VC 2.02 (57%) ratio 78 , erv 13%  DLCO 55 > 118% corrected - Skin bx 05/19/14 bridge of nose > Pos NCG - 05/02/2015  Walked RA x 3 laps @ 185 ft each stopped due to end of study, no sob/ no desat at nl pace/ no cough provoked  - PFT's  08/22/2016 VC  1.81 ratio 82  with DLCO  59/55 % corrects to 88 % for alv volume  - Spirometry 05/07/2017   FVC  1.81  With no obst by fev1/fvc and no significant curvature  - 05/07/2017  After extensive coaching HFA effectiveness =    75% from a baseline of < 25%  - 05/22/2017   Walked RA x one lap @ 185 stopped due to  Sob at fast pace with sats 96% - 09/09/17 placed on floor dose of 5 mg daily and flared late Oct 2018 when ran out of rx  - Spirometry 06/16/2018  FVC  1.82 (62%) s obst  - 06/16/2018   Walked RA x one lap @ 185 stopped due to  Sob, mod fast pace, no desats  - 09/17/2018   Walked RA x one lap @ 185 stopped due to  Sob/ no desat on ? Dose prednisone with ESR 74 so rec pred 20 ub then taper to 5 mg   - 01/14/2019    try dulera 100 2bid x 2 week sample then return in 2 weeks to regroup/ count use > only 20 puffs gone, confused with 2bid instruction > 06/29/2019 some better on duera 100 but can't afford it > changed to symb 80  - 06/29/2019   Walked RA x one lap =  approx 250 ft - stopped due to  Sob avg pace, no desats - 09/30/2019   Walked RA  2 laps @  approx 277ft each @ moderate pace  stopped due to fatigue with sats still 91%  - 11/07/2020 rec reduce prednisone to 5 mg qod   No evidence of active sarcoid so should be able to try taper to qod, advised on what to look for and if any problems with sarcoid or adrenal issues go back to q d dosing         Each maintenance medication was reviewed in detail  including emphasizing most importantly the difference between maintenance and prns and under what circumstances the prns are to be triggered using an action plan format where appropriate.  Total time for H and P, chart review, counseling,  and generating customized AVS unique to this office visit / charting =15 min

## 2020-11-20 ENCOUNTER — Other Ambulatory Visit: Payer: Self-pay | Admitting: Internal Medicine

## 2020-12-11 ENCOUNTER — Other Ambulatory Visit: Payer: Medicare HMO

## 2020-12-11 ENCOUNTER — Other Ambulatory Visit: Payer: Self-pay

## 2020-12-11 ENCOUNTER — Encounter: Payer: Self-pay | Admitting: Hematology and Oncology

## 2020-12-11 ENCOUNTER — Inpatient Hospital Stay: Payer: Medicare HMO

## 2020-12-11 ENCOUNTER — Inpatient Hospital Stay: Payer: Medicare HMO | Attending: Gynecologic Oncology

## 2020-12-11 ENCOUNTER — Ambulatory Visit: Payer: Medicare HMO | Admitting: Hematology and Oncology

## 2020-12-11 ENCOUNTER — Inpatient Hospital Stay: Payer: Medicare HMO | Admitting: Hematology and Oncology

## 2020-12-11 VITALS — BP 147/72 | HR 74 | Temp 98.6°F | Resp 18 | Ht 65.5 in | Wt 225.6 lb

## 2020-12-11 DIAGNOSIS — C7802 Secondary malignant neoplasm of left lung: Secondary | ICD-10-CM | POA: Insufficient documentation

## 2020-12-11 DIAGNOSIS — K5909 Other constipation: Secondary | ICD-10-CM | POA: Diagnosis not present

## 2020-12-11 DIAGNOSIS — C775 Secondary and unspecified malignant neoplasm of intrapelvic lymph nodes: Secondary | ICD-10-CM

## 2020-12-11 DIAGNOSIS — D6481 Anemia due to antineoplastic chemotherapy: Secondary | ICD-10-CM

## 2020-12-11 DIAGNOSIS — C541 Malignant neoplasm of endometrium: Secondary | ICD-10-CM | POA: Diagnosis not present

## 2020-12-11 DIAGNOSIS — R609 Edema, unspecified: Secondary | ICD-10-CM | POA: Diagnosis not present

## 2020-12-11 DIAGNOSIS — T451X5A Adverse effect of antineoplastic and immunosuppressive drugs, initial encounter: Secondary | ICD-10-CM | POA: Diagnosis not present

## 2020-12-11 DIAGNOSIS — R600 Localized edema: Secondary | ICD-10-CM

## 2020-12-11 DIAGNOSIS — D539 Nutritional anemia, unspecified: Secondary | ICD-10-CM

## 2020-12-11 LAB — SAMPLE TO BLOOD BANK

## 2020-12-11 LAB — CBC WITH DIFFERENTIAL (CANCER CENTER ONLY)
Abs Immature Granulocytes: 0.02 10*3/uL (ref 0.00–0.07)
Basophils Absolute: 0 10*3/uL (ref 0.0–0.1)
Basophils Relative: 0 %
Eosinophils Absolute: 0.2 10*3/uL (ref 0.0–0.5)
Eosinophils Relative: 2 %
HCT: 27.6 % — ABNORMAL LOW (ref 36.0–46.0)
Hemoglobin: 9.4 g/dL — ABNORMAL LOW (ref 12.0–15.0)
Immature Granulocytes: 0 %
Lymphocytes Relative: 18 %
Lymphs Abs: 1.3 10*3/uL (ref 0.7–4.0)
MCH: 33.7 pg (ref 26.0–34.0)
MCHC: 34.1 g/dL (ref 30.0–36.0)
MCV: 98.9 fL (ref 80.0–100.0)
Monocytes Absolute: 0.7 10*3/uL (ref 0.1–1.0)
Monocytes Relative: 10 %
Neutro Abs: 5.1 10*3/uL (ref 1.7–7.7)
Neutrophils Relative %: 70 %
Platelet Count: 170 10*3/uL (ref 150–400)
RBC: 2.79 MIL/uL — ABNORMAL LOW (ref 3.87–5.11)
RDW: 14.6 % (ref 11.5–15.5)
WBC Count: 7.3 10*3/uL (ref 4.0–10.5)
nRBC: 0 % (ref 0.0–0.2)

## 2020-12-11 LAB — CMP (CANCER CENTER ONLY)
ALT: 20 U/L (ref 0–44)
AST: 17 U/L (ref 15–41)
Albumin: 3.4 g/dL — ABNORMAL LOW (ref 3.5–5.0)
Alkaline Phosphatase: 92 U/L (ref 38–126)
Anion gap: 9 (ref 5–15)
BUN: 20 mg/dL (ref 8–23)
CO2: 27 mmol/L (ref 22–32)
Calcium: 9.5 mg/dL (ref 8.9–10.3)
Chloride: 106 mmol/L (ref 98–111)
Creatinine: 0.93 mg/dL (ref 0.44–1.00)
GFR, Estimated: >60 mL/min (ref >60)
Glucose, Bld: 184 mg/dL — ABNORMAL HIGH (ref 70–99)
Potassium: 3.7 mmol/L (ref 3.5–5.1)
Sodium: 142 mmol/L (ref 135–145)
Total Bilirubin: 0.3 mg/dL (ref 0.3–1.2)
Total Protein: 7.8 g/dL (ref 6.5–8.1)

## 2020-12-11 MED ORDER — HEPARIN SOD (PORK) LOCK FLUSH 100 UNIT/ML IV SOLN
500.0000 [IU] | Freq: Once | INTRAVENOUS | Status: AC
  Administered 2020-12-11: 500 [IU]
  Filled 2020-12-11: qty 5

## 2020-12-11 MED ORDER — SODIUM CHLORIDE 0.9% FLUSH
10.0000 mL | Freq: Once | INTRAVENOUS | Status: AC
  Administered 2020-12-11: 10 mL
  Filled 2020-12-11: qty 10

## 2020-12-11 NOTE — Progress Notes (Signed)
[facility] OFFICE PROGRESS NOTE  Patient Care Team: [name], [name], MD as PCP - General (Family Medicine)  ASSESSMENT & PLAN:  Endometrial cancer [facility]) From previous discussion, I recommend treatment break to allow recovery from side effects of treatment The patient is instructed to pay attention to new abdominal symptoms over the next few weeks She is recommended to call if she have new onset of bloating, nausea, changes in bowel habits or pain I plan to repeat imaging study middle of February for further assessment  Anemia due to antineoplastic chemotherapy Her blood count is improved since discontinuation of treatment Observe closely for now  Bilateral lower extremity edema She has signs of fluid retention from uncontrolled hypertension and probably her diet I recommend discontinuation of amlodipine and resumption of hydrochlorothiazide She will continue aggressive lifestyle changes and close monitoring of her blood pressure   Other constipation Recommend aggressive laxative therapy   Orders Placed This Encounter  Procedures  . CT CHEST ABDOMEN PELVIS W CONTRAST    Standing Status:   Future    Standing Expiration Date:   12/11/2021    Order Specific Question:   Preferred imaging location?    Answer:   [facility]    Order Specific Question:   Radiology Contrast Protocol - do NOT remove file path    Answer:   [email or web address]    All questions were answered. The patient knows to call the clinic with any problems, questions or concerns. The total time spent in the appointment was 25 minutes encounter with patients including review of chart and various tests results, discussions about plan of care and coordination of care plan   [name], MD 12/11/2020 1:39 PM  INTERVAL HISTORY: Please see below for problem oriented charting. She returns with her daughter for further follow-up She has gained some  weight She has noted significant fluid retention and leg swelling Denies abdominal pain or bloating She has occasional rare nausea She goes to the bathroom every other day and does not take laxatives Neuropathy is stable  SUMMARY OF ONCOLOGIC HISTORY: Oncology History Overview Note  ER/PR neg, MSI stable   Endometrial cancer ([place])  06/15/2018 Surgery   Surgeon: Donaciano Eva     Operation: Robotic-assisted laparoscopic total hysterectomy with bilateral salpingoophorectomy, SLN biopsy     Operative Findings:  : 6cm uterus, normal tubes and ovaries, omentum adherent to the anterior abdominal wall and uterine fundus. No suspicious nodes.   06/15/2018 Pathology Results   Procedure: Total hysterectomy with bilateral salpingo-oophorectomy. Bilateral obturator sentinel lymph node biopsies. Histologic type: Endometrioid adenocarcinoma. Histologic Grade: FIGO grade 1. Myometrial invasion: Depth of invasion: 15 mm Myometrial thickness: 20 mm Uterine Serosa Involvement: Not identified. Cervical stromal involvement: Not identified. Extent of involvement of other organs: Uninvolved. Lymphovascular invasion: Present. Regional Lymph Nodes: Examined: 2 Sentinel 0 Non-sentinel 2 Total Lymph nodes with metastasis: 0 Isolated tumor cells (< 0.2 mm): 2 Micrometastasis: (> 0.2 mm and < 2.0 mm): 0 Macrometastasis: (> 2.0 mm): 0 Extracapsular extension: N/A. Tumor block for ancillary studies: 4Q-S. MMR / MSI testing: Will be ordered. Pathologic Stage Classification (pTNM, AJCC 8th edition): pT1b, pN0(i+)   07/20/2018 Imaging   1. Surgical changes from recent hysterectomy. No findings suspicious for residual tumor or adenopathy in the pelvis. 2. No CT findings to suggest omental, peritoneal surface or solid organ metastatic disease in the abdomen. 3. Changes consistent with known sarcoidosis in the chest with bulky calcified mediastinal and hilar lymph nodes and  interstitial lung disease. I  do not see any definite CT findings to suggest metastatic disease involving the chest.   08/19/2018 - 09/17/2018 Radiation Therapy   08/19/18 - 09/17/18: Vaginal Cuff, 3 cm cylinder with treatment length of 3 cm / 30 Gy delivered in 5 fractions of 6 Gy, Brachytherapy / HDR, Iridium-192   05/2005: Left Chest Wall keloid   04/2004 - 05/2004: Anus, pelvis /inguinal ( Dr. Tammi Klippel)   01/19/2020 Imaging   1. No definite CT findings of the abdomen or pelvis to explain pain. 2. The sigmoid colon and rectum are decompressed although slightly thickened appearing, suggestive of nonspecific infectious, inflammatory, or ischemic colitis. Correlate for referable clinical symptoms. 3. There is trace, nonspecific free fluid in the low pelvis, which may be reactive, although would be difficult to distinguish from postoperative and post treatment soft tissue change in the pelvis given history of hysterectomy and rectal cancer. 4. Unchanged prominent retroperitoneal and iliac lymph nodes. No new findings suspicious for metastatic disease in the abdomen or pelvis. 5. Bibasilar pulmonary fibrosis and mediastinal and hilar lymphadenopathy, better evaluated by prior CT chest and consistent with patient diagnosis of sarcoidosis.       04/18/2020 Imaging   Venous Doppler Left:  - No evidence of deep vein thrombosis seen in the left lower extremity, from the common femoral through the popliteal veins.  - No evidence of superficial venous thrombosis in the left lower extremity.  - There is no evidence of venous reflux seen in the left lower extremity.  - Rouleax flow noted in the superficial system.    05/18/2020 PET scan   1. Complex scan with a combination of hypermetabolic granulomatous disease (sarcoidosis) as well as hypermetabolic metastatic endometrial carcinoma. 2. Hypermetabolic mediastinal hilar lymph nodes and pulmonary nodularity with bronchovascular thickening is consistent with sarcoidosis. 3. Solitary  hypermetabolic nodule in the medial LEFT lower lobe, new from prior with intense metabolic activity, is concerning for a metastatic deposit to the LEFT lower lobe. 4. Ill-defined hypermetabolic soft tissue along the LEFT operator space is most consistent with local recurrence of endometrial carcinoma. There is entrapment of the LEFT ureter by this hypermetabolic soft tissue with mild hydroureter and hydronephrosis on the LEFT. 5. Additionally there appears to be direct extension from the hypermetabolic left operator space soft tissue to the sigmoid colon with there is a well-defined hypermetabolic lesion in the colon. 6. More distant hypermetabolic peritoneal metastasis along the ventral peritoneal surface of the RIGHT abdomen beneath RIGHT rectus abdominus muscle. 7. Hypermetabolic lymph nodes in the porta hepatis are favored related to sarcoidosis.   05/19/2020 Cancer Staging   Staging form: Corpus Uteri - Carcinoma and Carcinosarcoma, AJCC 8th Edition - Clinical stage from 05/19/2020: Stage IVB (rcT1, cN2a, cM1) - Signed by [name], MD on 05/19/2020   05/26/2020 Procedure   Successful placement of a right internal jugular approach power injectable Port-A-Cath. The catheter is ready for immediate use   05/29/2020 - 09/27/2020 Chemotherapy   The patient had carboplatin and taxol for chemotherapy treatment.     07/31/2020 Imaging   1. Asymmetric soft tissue mass extending from the left vaginal cuff has decreased in size in the interval. No residual peritoneal nodularity. Pulmonary metastases are stable with exception of a minimally improved left lower lobe nodule. 2. Stable soft tissue nodule associated with the right rectus abdominus muscle. 3. Interval resolution of left hydronephrosis. 4. Calcified mediastinal and hilar lymph nodes, together with pulmonary parenchymal fibrosis, findings which are in keeping  with patient's known diagnosis of sarcoid. 5. Aortic atherosclerosis (ICD10-I70.0).  Coronary artery calcification.   10/27/2020 Imaging   1. Essentially stable appearance of the soft tissue density nodule posteriorly in the left rectus abdominus muscle, currently 1.8 by 1.2 cm, previously 11 mm. 2. Stable calcified adenopathy in the mediastinum and hila, compatible with prior granulomatous disease. 3. Stable small right lower lobe pulmonary nodules. 4. Stable soft tissue density prominence along the left vaginal cuff extending up towards the adnexa, probably therapy related. 5. Other imaging findings of potential clinical significance: Coronary atherosclerosis. Small type 1 hiatal hernia. Stable subpleural reticulation favoring the lung bases, possibly from mild fibrosis. 6. Aortic atherosclerosis.   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       REVIEW OF SYSTEMS:   Constitutional: Denies fevers, chills or abnormal weight loss Eyes: Denies blurriness of vision Ears, nose, mouth, throat, and face: Denies mucositis or sore throat Respiratory: Denies cough, dyspnea or wheezes Skin: Denies abnormal skin rashes Lymphatics: Denies new lymphadenopathy or easy bruising Neurological:Denies numbness, tingling or new weaknesses Behavioral/Psych: Mood is stable, no new changes  All other systems were reviewed with the patient and are negative.  I have reviewed the past medical history, past surgical history, social history and family history with the patient and they are unchanged from previous note.  ALLERGIES:  is  allergic to shellfish allergy and penicillins.  MEDICATIONS:  Current Outpatient Medications  Medication Sig Dispense Refill  . hydrochlorothiazide (HYDRODIURIL) 25 MG tablet Take 25 mg by mouth daily.    Marland Kitchen albuterol (VENTOLIN HFA) 108 (90 Base) MCG/ACT inhaler INHALE 2 PUFFS INTO THE LUNGS EVERY 6 HOURS AS NEEDED FOR WHEEZING OR SHORTNESS OF BREATH (NEED MD APPOINTMENT) 3 each 0  . amLODipine (NORVASC) 10 MG tablet Take 1 tablet (10 mg total) by mouth daily. 90 tablet 5  . bisoprolol (ZEBETA) 10 MG tablet Take 1 tablet (10 mg total) by mouth in the morning and at bedtime. 180 tablet 0  . budesonide-formoterol (SYMBICORT) 80-4.5 MCG/ACT inhaler Inhale 2 puffs into the lungs 2 (two) times daily. 3 Inhaler 3  . desonide (DESOWEN) 0.05 % lotion Apply topically 2 (two) times daily. Continue following with dermatologist 59 mL 0  . dextromethorphan (DELSYM) 30 MG/5ML liquid Take by mouth as needed for cough.     . hydrocortisone 2.5 % cream Apply 1 application topically 2 (two) times daily.    Marland Kitchen loratadine (CLARITIN) 10 MG tablet Take 10 mg by mouth daily as needed for allergies.    Marland Kitchen ondansetron (ZOFRAN) 8 MG tablet Take 1 tablet (8 mg total) by mouth every 8 (eight) hours as needed for nausea. 30 tablet 3  . pantoprazole (PROTONIX) 40 MG tablet Take 1 tablet (40 mg total) by mouth daily. Patient needs appt for further refills. 90 tablet 0  . predniSONE (DELTASONE) 10 MG tablet Take 0.5 tablets (5 mg total) by mouth daily with breakfast. Patient needs appt for further refills 90 tablet 0  . prochlorperazine (COMPAZINE) 10 MG tablet Take 1 tablet (10 mg total) by mouth every  6 (six) hours as needed for nausea or vomiting. 30 tablet 3  . RABEprazole (ACIPHEX) 20 MG tablet Take 1 tablet (20 mg total) by mouth daily before breakfast. 90 tablet 3  . vitamin C (ASCORBIC ACID) 500 MG tablet Take 500 mg by mouth daily.     No current facility-administered medications for this visit.    PHYSICAL  EXAMINATION: ECOG PERFORMANCE STATUS: 1 - Symptomatic but completely ambulatory  Vitals:   12/11/20 1308  BP: (!) 147/72  Pulse: 74  Resp: 18  Temp: 98.6 F (37 C)  SpO2: 99%   Filed Weights   12/11/20 1308  Weight: 225 lb 9.6 oz (102.3 kg)    GENERAL:alert, no distress and comfortable SKIN: skin color, texture, turgor are normal, no rashes or significant lesions EYES: normal, Conjunctiva are pink and non-injected, sclera clear OROPHARYNX:no exudate, no erythema and lips, buccal mucosa, and tongue normal  NECK: supple, thyroid normal size, non-tender, without nodularity LYMPH:  no palpable lymphadenopathy in the cervical, axillary or inguinal LUNGS: clear to auscultation and percussion with normal breathing effort HEART: regular rate & rhythm and no murmurs with moderate bilateral lower extremity edema ABDOMEN:abdomen soft, non-tender and normal bowel sounds Musculoskeletal:no cyanosis of digits and no clubbing  NEURO: alert & oriented x 3 with fluent speech, no focal motor/sensory deficits  LABORATORY DATA:  I have reviewed the data as listed    Component Value Date/Time   NA 142 12/11/2020 1255   K 3.7 12/11/2020 1255   CL 106 12/11/2020 1255   CO2 27 12/11/2020 1255   GLUCOSE 184 (H) 12/11/2020 1255   BUN 20 12/11/2020 1255   CREATININE 0.93 12/11/2020 1255   CALCIUM 9.5 12/11/2020 1255   PROT 7.8 12/11/2020 1255   ALBUMIN 3.4 (L) 12/11/2020 1255   AST 17 12/11/2020 1255   ALT 20 12/11/2020 1255   ALKPHOS 92 12/11/2020 1255   BILITOT 0.3 12/11/2020 1255   GFRNONAA >60 12/11/2020 1255   GFRAA >60 09/08/2020 0819    No results found for: SPEP, UPEP  Lab Results  Component Value Date   WBC 7.3 12/11/2020   NEUTROABS 5.1 12/11/2020   HGB 9.4 (L) 12/11/2020   HCT 27.6 (L) 12/11/2020   MCV 98.9 12/11/2020   PLT 170 12/11/2020      Chemistry      Component Value Date/Time   NA 142 12/11/2020 1255   K 3.7 12/11/2020 1255   CL 106 12/11/2020 1255   CO2  27 12/11/2020 1255   BUN 20 12/11/2020 1255   CREATININE 0.93 12/11/2020 1255      Component Value Date/Time   CALCIUM 9.5 12/11/2020 1255   ALKPHOS 92 12/11/2020 1255   AST 17 12/11/2020 1255   ALT 20 12/11/2020 1255   BILITOT 0.3 12/11/2020 1255

## 2020-12-11 NOTE — Assessment & Plan Note (Signed)
Her blood count is improved since discontinuation of treatment Observe closely for now

## 2020-12-11 NOTE — Assessment & Plan Note (Signed)
She has signs of fluid retention from uncontrolled hypertension and probably her diet I recommend discontinuation of amlodipine and resumption of hydrochlorothiazide She will continue aggressive lifestyle changes and close monitoring of her blood pressure

## 2020-12-11 NOTE — Assessment & Plan Note (Signed)
Recommend aggressive laxative therapy

## 2020-12-11 NOTE — Assessment & Plan Note (Signed)
From previous discussion, I recommend treatment break to allow recovery from side effects of treatment The patient is instructed to pay attention to new abdominal symptoms over the next few weeks She is recommended to call if she have new onset of bloating, nausea, changes in bowel habits or pain I plan to repeat imaging study middle of February for further assessment

## 2020-12-19 ENCOUNTER — Telehealth: Payer: Self-pay

## 2020-12-19 NOTE — Telephone Encounter (Signed)
Called and left a message asking her to call the office back with bp readings. °

## 2020-12-19 NOTE — Telephone Encounter (Signed)
-----   Message from Artis Delay, MD sent at 12/19/2020 11:12 AM EST ----- Regarding: can you call and check on her BP?

## 2020-12-20 NOTE — Telephone Encounter (Signed)
Left a message asking her to call the office back. 

## 2020-12-20 NOTE — Telephone Encounter (Signed)
Called back to blood pressure readings.  12/30 am 142/70 and pm 129/70 12/31 am 122/66 and pm 144/70 1/1 am 142/69 and pm 142/70 1/2 am 138/74 and pm 135/74 1/3 am 162/79 and pm 163/79, she thinks she had too much salt/ sodium that day. 1/4 am 145/70 and pm 140/69 1/5 am 144/77 She is watching her sodium/ salt intake closely.

## 2021-01-08 ENCOUNTER — Telehealth: Payer: Self-pay

## 2021-01-08 NOTE — Telephone Encounter (Signed)
She called and left a message to call her.  Called back. She had x 1 episode of vomiting on Saturday, that resolved with no medication. On Saturday she started having abdominal pain and pressure. Denies constipation/ diarrhea. Eating and drinking with no problems. She is going to take some tylenol to see if it helps and try a heating pad.

## 2021-01-08 NOTE — Telephone Encounter (Signed)
Add her to see me tomorrow at 2 pm, 20 mins

## 2021-01-08 NOTE — Telephone Encounter (Signed)
Called back and scheduled appt. She is aware of appt time for tomorrow.

## 2021-01-09 ENCOUNTER — Other Ambulatory Visit: Payer: Self-pay

## 2021-01-09 ENCOUNTER — Other Ambulatory Visit: Payer: Self-pay | Admitting: Hematology and Oncology

## 2021-01-09 ENCOUNTER — Telehealth: Payer: Self-pay

## 2021-01-09 ENCOUNTER — Encounter: Payer: Self-pay | Admitting: Hematology and Oncology

## 2021-01-09 ENCOUNTER — Inpatient Hospital Stay: Payer: Medicare HMO | Attending: Gynecologic Oncology | Admitting: Hematology and Oncology

## 2021-01-09 ENCOUNTER — Inpatient Hospital Stay: Payer: Medicare HMO

## 2021-01-09 VITALS — BP 163/73 | HR 72 | Temp 98.5°F | Resp 18 | Ht 65.5 in | Wt 220.8 lb

## 2021-01-09 DIAGNOSIS — T451X5A Adverse effect of antineoplastic and immunosuppressive drugs, initial encounter: Secondary | ICD-10-CM

## 2021-01-09 DIAGNOSIS — I10 Essential (primary) hypertension: Secondary | ICD-10-CM | POA: Diagnosis not present

## 2021-01-09 DIAGNOSIS — C775 Secondary and unspecified malignant neoplasm of intrapelvic lymph nodes: Secondary | ICD-10-CM

## 2021-01-09 DIAGNOSIS — K5909 Other constipation: Secondary | ICD-10-CM | POA: Insufficient documentation

## 2021-01-09 DIAGNOSIS — C7802 Secondary malignant neoplasm of left lung: Secondary | ICD-10-CM | POA: Insufficient documentation

## 2021-01-09 DIAGNOSIS — R5383 Other fatigue: Secondary | ICD-10-CM

## 2021-01-09 DIAGNOSIS — C541 Malignant neoplasm of endometrium: Secondary | ICD-10-CM

## 2021-01-09 DIAGNOSIS — Z79899 Other long term (current) drug therapy: Secondary | ICD-10-CM | POA: Diagnosis not present

## 2021-01-09 DIAGNOSIS — R300 Dysuria: Secondary | ICD-10-CM

## 2021-01-09 DIAGNOSIS — D6481 Anemia due to antineoplastic chemotherapy: Secondary | ICD-10-CM | POA: Diagnosis not present

## 2021-01-09 LAB — CBC WITH DIFFERENTIAL (CANCER CENTER ONLY)
Abs Immature Granulocytes: 0.03 10*3/uL (ref 0.00–0.07)
Basophils Absolute: 0 10*3/uL (ref 0.0–0.1)
Basophils Relative: 0 %
Eosinophils Absolute: 0.1 10*3/uL (ref 0.0–0.5)
Eosinophils Relative: 2 %
HCT: 31.5 % — ABNORMAL LOW (ref 36.0–46.0)
Hemoglobin: 10.7 g/dL — ABNORMAL LOW (ref 12.0–15.0)
Immature Granulocytes: 0 %
Lymphocytes Relative: 16 %
Lymphs Abs: 1.1 10*3/uL (ref 0.7–4.0)
MCH: 33.3 pg (ref 26.0–34.0)
MCHC: 34 g/dL (ref 30.0–36.0)
MCV: 98.1 fL (ref 80.0–100.0)
Monocytes Absolute: 0.7 10*3/uL (ref 0.1–1.0)
Monocytes Relative: 11 %
Neutro Abs: 4.8 10*3/uL (ref 1.7–7.7)
Neutrophils Relative %: 71 %
Platelet Count: 191 10*3/uL (ref 150–400)
RBC: 3.21 MIL/uL — ABNORMAL LOW (ref 3.87–5.11)
RDW: 14.7 % (ref 11.5–15.5)
WBC Count: 6.8 10*3/uL (ref 4.0–10.5)
nRBC: 0 % (ref 0.0–0.2)

## 2021-01-09 LAB — CMP (CANCER CENTER ONLY)
ALT: 13 U/L (ref 0–44)
AST: 14 U/L — ABNORMAL LOW (ref 15–41)
Albumin: 3.8 g/dL (ref 3.5–5.0)
Alkaline Phosphatase: 94 U/L (ref 38–126)
Anion gap: 11 (ref 5–15)
BUN: 18 mg/dL (ref 8–23)
CO2: 25 mmol/L (ref 22–32)
Calcium: 9.3 mg/dL (ref 8.9–10.3)
Chloride: 108 mmol/L (ref 98–111)
Creatinine: 0.92 mg/dL (ref 0.44–1.00)
GFR, Estimated: >60 mL/min (ref >60)
Glucose, Bld: 157 mg/dL — ABNORMAL HIGH (ref 70–99)
Potassium: 3.4 mmol/L — ABNORMAL LOW (ref 3.5–5.1)
Sodium: 144 mmol/L (ref 135–145)
Total Bilirubin: 0.5 mg/dL (ref 0.3–1.2)
Total Protein: 8.4 g/dL — ABNORMAL HIGH (ref 6.5–8.1)

## 2021-01-09 LAB — URINALYSIS, COMPLETE (UACMP) WITH MICROSCOPIC
Bilirubin Urine: NEGATIVE
Glucose, UA: NEGATIVE mg/dL
Hgb urine dipstick: NEGATIVE
Ketones, ur: NEGATIVE mg/dL
Nitrite: NEGATIVE
Protein, ur: NEGATIVE mg/dL
Specific Gravity, Urine: 1.018 (ref 1.005–1.030)
WBC, UA: >50 WBC/hpf — ABNORMAL HIGH (ref 0–5)
pH: 5 (ref 5.0–8.0)

## 2021-01-09 LAB — TSH: TSH: 2.957 u[IU]/mL (ref 0.308–3.960)

## 2021-01-09 MED ORDER — SPIRONOLACTONE 25 MG PO TABS: 25.0000 mg | ORAL_TABLET | Freq: Every day | ORAL | 1 refills | Status: DC

## 2021-01-09 NOTE — Progress Notes (Signed)
[place] OFFICE PROGRESS NOTE  Patient Care Team: [name], [name], MD as PCP - General (Family Medicine)  ASSESSMENT & PLAN:  Endometrial cancer [facility]) I am concerned about her symptoms She felt bloated, had recent vomiting and suprapubic pain I will proceed to order some blood work, urinalysis and urine culture I recommend moving her CT scan to be done sooner and I will see her afterwards to review test results  Anemia due to antineoplastic chemotherapy She has persistent anemia, stable Observe closely for now  Essential hypertension Her blood pressure is profoundly elevated Both legs are swollen There is some issues with compliance with low-salt diet I do not believe amlodipine is a good choice for her because it can exacerbate leg swelling She will continue hydrochlorothiazide but I plan to add a second diuretic with spironolactone I will reassess next week for further follow-up I am hopeful with better control of her fluid retention and blood pressure, her symptoms might improve  Dysuria She has symptoms of suprapubic pain, dysuria and frequency I will order urinalysis and urine culture to rule out infection I will call her with test results  Other constipation She has sensation of vomiting, sensation of bloating and known history of constipation I recommend aggressive laxative for now   Orders Placed This Encounter  Procedures  . Urine Culture    Standing Status:   Future    Number of Occurrences:   1    Standing Expiration Date:   01/09/2022  . Urinalysis, Complete w Microscopic    Standing Status:   Future    Number of Occurrences:   1    Standing Expiration Date:   01/09/2022  . TSH    Standing Status:   Standing    Number of Occurrences:   1    Standing Expiration Date:   01/09/2022    All questions were answered. The patient knows to call the clinic with any problems, questions or concerns. The total time spent in the appointment was 40  minutes encounter with patients including review of chart and various tests results, discussions about plan of care and coordination of care plan   [name], MD 01/09/2021 3:25 PM  INTERVAL HISTORY: Please see below for problem oriented charting. She returns for urgent evaluation She has not been feeling well She complains of dysuria and urinary frequency recently No hematuria She continues to have significant bilateral lower extremity edema She has lost some weight because she is afraid to eat She had one episode of vomiting after breakfast several days ago She had frequent small bowel movement She felt bloated She has suprapubic pain/discomfort No recent fever or chills  SUMMARY OF ONCOLOGIC HISTORY: Oncology History Overview Note  ER/PR neg, MSI stable   Endometrial cancer ([place])  06/15/2018 Surgery   Surgeon: Donaciano Eva     Operation: Robotic-assisted laparoscopic total hysterectomy with bilateral salpingoophorectomy, SLN biopsy     Operative Findings:  : 6cm uterus, normal tubes and ovaries, omentum adherent to the anterior abdominal wall and uterine fundus. No suspicious nodes.   06/15/2018 Pathology Results   Procedure: Total hysterectomy with bilateral salpingo-oophorectomy. Bilateral obturator sentinel lymph node biopsies. Histologic type: Endometrioid adenocarcinoma. Histologic Grade: FIGO grade 1. Myometrial invasion: Depth of invasion: 15 mm Myometrial thickness: 20 mm Uterine Serosa Involvement: Not identified. Cervical stromal involvement: Not identified. Extent of involvement of other organs: Uninvolved. Lymphovascular invasion: Present. Regional Lymph Nodes: Examined: 2 Sentinel 0 Non-sentinel 2 Total Lymph nodes with  metastasis: 0 Isolated tumor cells (< 0.2 mm): 2 Micrometastasis: (> 0.2 mm and < 2.0 mm): 0 Macrometastasis: (> 2.0 mm): 0 Extracapsular extension: N/A. Tumor block for ancillary studies: 4Q-S. MMR / MSI testing: Will be  ordered. Pathologic Stage Classification (pTNM, AJCC 8th edition): pT1b, pN0(i+)   07/20/2018 Imaging   1. Surgical changes from recent hysterectomy. No findings suspicious for residual tumor or adenopathy in the pelvis. 2. No CT findings to suggest omental, peritoneal surface or solid organ metastatic disease in the abdomen. 3. Changes consistent with known sarcoidosis in the chest with bulky calcified mediastinal and hilar lymph nodes and interstitial lung disease. I do not see any definite CT findings to suggest metastatic disease involving the chest.   08/19/2018 - 09/17/2018 Radiation Therapy   08/19/18 - 09/17/18: Vaginal Cuff, 3 cm cylinder with treatment length of 3 cm / 30 Gy delivered in 5 fractions of 6 Gy, Brachytherapy / HDR, Iridium-192   05/2005: Left Chest Wall keloid   04/2004 - 05/2004: Anus, pelvis /inguinal ( Dr. Tammi Klippel)   01/19/2020 Imaging   1. No definite CT findings of the abdomen or pelvis to explain pain. 2. The sigmoid colon and rectum are decompressed although slightly thickened appearing, suggestive of nonspecific infectious, inflammatory, or ischemic colitis. Correlate for referable clinical symptoms. 3. There is trace, nonspecific free fluid in the low pelvis, which may be reactive, although would be difficult to distinguish from postoperative and post treatment soft tissue change in the pelvis given history of hysterectomy and rectal cancer. 4. Unchanged prominent retroperitoneal and iliac lymph nodes. No new findings suspicious for metastatic disease in the abdomen or pelvis. 5. Bibasilar pulmonary fibrosis and mediastinal and hilar lymphadenopathy, better evaluated by prior CT chest and consistent with patient diagnosis of sarcoidosis.       04/18/2020 Imaging   Venous Doppler Left:  - No evidence of deep vein thrombosis seen in the left lower extremity, from the common femoral through the popliteal veins.  - No evidence of superficial venous thrombosis in the left  lower extremity.  - There is no evidence of venous reflux seen in the left lower extremity.  - Rouleax flow noted in the superficial system.    05/18/2020 PET scan   1. Complex scan with a combination of hypermetabolic granulomatous disease (sarcoidosis) as well as hypermetabolic metastatic endometrial carcinoma. 2. Hypermetabolic mediastinal hilar lymph nodes and pulmonary nodularity with bronchovascular thickening is consistent with sarcoidosis. 3. Solitary hypermetabolic nodule in the medial LEFT lower lobe, new from prior with intense metabolic activity, is concerning for a metastatic deposit to the LEFT lower lobe. 4. Ill-defined hypermetabolic soft tissue along the LEFT operator space is most consistent with local recurrence of endometrial carcinoma. There is entrapment of the LEFT ureter by this hypermetabolic soft tissue with mild hydroureter and hydronephrosis on the LEFT. 5. Additionally there appears to be direct extension from the hypermetabolic left operator space soft tissue to the sigmoid colon with there is a well-defined hypermetabolic lesion in the colon. 6. More distant hypermetabolic peritoneal metastasis along the ventral peritoneal surface of the RIGHT abdomen beneath RIGHT rectus abdominus muscle. 7. Hypermetabolic lymph nodes in the porta hepatis are favored related to sarcoidosis.   05/19/2020 Cancer Staging   Staging form: Corpus Uteri - Carcinoma and Carcinosarcoma, AJCC 8th Edition - Clinical stage from 05/19/2020: Stage IVB (rcT1, cN2a, cM1) - Signed by [name], MD on 05/19/2020   05/26/2020 Procedure   Successful placement of a right internal jugular approach  power injectable Port-A-Cath. The catheter is ready for immediate use   05/29/2020 - 09/27/2020 Chemotherapy   The patient had carboplatin and taxol for chemotherapy treatment.     07/31/2020 Imaging   1. Asymmetric soft tissue mass extending from the left vaginal cuff has decreased in size in the interval. No  residual peritoneal nodularity. Pulmonary metastases are stable with exception of a minimally improved left lower lobe nodule. 2. Stable soft tissue nodule associated with the right rectus abdominus muscle. 3. Interval resolution of left hydronephrosis. 4. Calcified mediastinal and hilar lymph nodes, together with pulmonary parenchymal fibrosis, findings which are in keeping with patient's known diagnosis of sarcoid. 5. Aortic atherosclerosis (ICD10-I70.0). Coronary artery calcification.   10/27/2020 Imaging   1. Essentially stable appearance of the soft tissue density nodule posteriorly in the left rectus abdominus muscle, currently 1.8 by 1.2 cm, previously 11 mm. 2. Stable calcified adenopathy in the mediastinum and hila, compatible with prior granulomatous disease. 3. Stable small right lower lobe pulmonary nodules. 4. Stable soft tissue density prominence along the left vaginal cuff extending up towards the adnexa, probably therapy related. 5. Other imaging findings of potential clinical significance: Coronary atherosclerosis. Small type 1 hiatal hernia. Stable subpleural reticulation favoring the lung bases, possibly from mild fibrosis. 6. Aortic atherosclerosis.   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)   05/29/2020 - 09/27/2020 Chemotherapy   The patient had carboplatin and taxol for chemotherapy treatment.     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       REVIEW OF SYSTEMS:   Constitutional: Denies fevers, chills Eyes: Denies blurriness of vision Ears, nose, mouth, throat, and face: Denies mucositis or sore throat Respiratory: Denies cough, dyspnea or wheezes Skin: Denies  abnormal skin rashes Lymphatics: Denies new lymphadenopathy or easy bruising Neurological:Denies numbness, tingling or new weaknesses Behavioral/Psych: Mood is stable, no new changes  All other systems were reviewed with the patient and are negative.  I have reviewed the past medical history, past surgical history, social history and family history with the patient and they are unchanged from previous note.  ALLERGIES:  is allergic to shellfish allergy and penicillins.  MEDICATIONS:  Current Outpatient Medications  Medication Sig Dispense Refill  . polyethylene glycol (MIRALAX / GLYCOLAX) 17 g packet Take 17 g by mouth daily.    Marland Kitchen spironolactone (ALDACTONE) 25 MG tablet Take 1 tablet (25 mg total) by mouth daily. 30 tablet 1  . albuterol (VENTOLIN HFA) 108 (90 Base) MCG/ACT inhaler INHALE 2 PUFFS INTO THE LUNGS EVERY 6 HOURS AS NEEDED FOR WHEEZING OR SHORTNESS OF BREATH (NEED MD APPOINTMENT) 3 each 0  . bisoprolol (ZEBETA) 10 MG tablet Take 1 tablet (10 mg total) by mouth in the morning and at bedtime. 180 tablet 0  . budesonide-formoterol (SYMBICORT) 80-4.5 MCG/ACT inhaler Inhale 2 puffs into the lungs 2 (two) times daily. 3 Inhaler 3  . desonide (DESOWEN) 0.05 % lotion Apply topically 2 (two) times daily. Continue following with dermatologist 59 mL 0  . dextromethorphan (DELSYM) 30 MG/5ML liquid Take by mouth as needed for cough.     . hydrochlorothiazide (HYDRODIURIL) 25 MG tablet Take 25 mg by mouth daily.    . hydrocortisone 2.5 % cream Apply 1 application  topically 2 (two) times daily.    Marland Kitchen loratadine (CLARITIN) 10 MG tablet Take 10 mg by mouth daily as needed for allergies.    Marland Kitchen ondansetron (ZOFRAN) 8 MG tablet Take 1 tablet (8 mg total) by mouth every 8 (eight) hours as needed for nausea. 30 tablet 3  . pantoprazole (PROTONIX) 40 MG tablet Take 1 tablet (40 mg total) by mouth daily. Patient needs appt for further refills. 90 tablet 0  . predniSONE (DELTASONE) 10 MG tablet Take 0.5  tablets (5 mg total) by mouth daily with breakfast. Patient needs appt for further refills 90 tablet 0  . prochlorperazine (COMPAZINE) 10 MG tablet Take 1 tablet (10 mg total) by mouth every 6 (six) hours as needed for nausea or vomiting. 30 tablet 3  . RABEprazole (ACIPHEX) 20 MG tablet Take 1 tablet (20 mg total) by mouth daily before breakfast. 90 tablet 3  . vitamin C (ASCORBIC ACID) 500 MG tablet Take 500 mg by mouth daily.     No current facility-administered medications for this visit.    PHYSICAL EXAMINATION: ECOG PERFORMANCE STATUS: 1 - Symptomatic but completely ambulatory  Vitals:   01/09/21 1429  BP: (!) 163/73  Pulse: 72  Resp: 18  Temp: 98.5 F (36.9 C)  SpO2: 100%   Filed Weights   01/09/21 1429  Weight: 220 lb 12.8 oz (100.2 kg)    GENERAL:alert, no distress and comfortable SKIN: skin color, texture, turgor are normal, no rashes or significant lesions EYES: normal, Conjunctiva are pink and non-injected, sclera clear OROPHARYNX:no exudate, no erythema and lips, buccal mucosa, and tongue normal  NECK: supple, thyroid normal size, non-tender, without nodularity LYMPH:  no palpable lymphadenopathy in the cervical, axillary or inguinal LUNGS: clear to auscultation and percussion with normal breathing effort HEART: regular rate & rhythm and no murmurs with moderate bilateral lower extremity edema ABDOMEN:abdomen soft, distended, nontender to touch, probable suprapubic fullness Musculoskeletal:no cyanosis of digits and no clubbing  NEURO: alert & oriented x 3 with fluent speech, no focal motor/sensory deficits  LABORATORY DATA:  I have reviewed the data as listed    Component Value Date/Time   NA 142 12/11/2020 1255   K 3.7 12/11/2020 1255   CL 106 12/11/2020 1255   CO2 27 12/11/2020 1255   GLUCOSE 184 (H) 12/11/2020 1255   BUN 20 12/11/2020 1255   CREATININE 0.93 12/11/2020 1255   CALCIUM 9.5 12/11/2020 1255   PROT 7.8 12/11/2020 1255   ALBUMIN 3.4 (L)  12/11/2020 1255   AST 17 12/11/2020 1255   ALT 20 12/11/2020 1255   ALKPHOS 92 12/11/2020 1255   BILITOT 0.3 12/11/2020 1255   GFRNONAA >60 12/11/2020 1255   GFRAA >60 09/08/2020 0819    No results found for: SPEP, UPEP  Lab Results  Component Value Date   WBC 6.8 01/09/2021   NEUTROABS 4.8 01/09/2021   HGB 10.7 (L) 01/09/2021   HCT 31.5 (L) 01/09/2021   MCV 98.1 01/09/2021   PLT 191 01/09/2021      Chemistry      Component Value Date/Time   NA 142 12/11/2020 1255   K 3.7 12/11/2020 1255   CL 106 12/11/2020 1255   CO2 27 12/11/2020 1255   BUN 20 12/11/2020 1255   CREATININE 0.93 12/11/2020 1255      Component Value Date/Time   CALCIUM 9.5 12/11/2020 1255   ALKPHOS 92 12/11/2020 1255   AST 17 12/11/2020 1255   ALT 20 12/11/2020 1255   BILITOT 0.3 12/11/2020  [place]

## 2021-01-09 NOTE — Telephone Encounter (Signed)
Called and given 2/1 appt at 2:30 with Dr. Alvy Bimler. She verbalized understanding.

## 2021-01-09 NOTE — Assessment & Plan Note (Signed)
Her blood pressure is profoundly elevated Both legs are swollen There is some issues with compliance with low-salt diet I do not believe amlodipine is a good choice for her because it can exacerbate leg swelling She will continue hydrochlorothiazide but I plan to add a second diuretic with spironolactone I will reassess next week for further follow-up I am hopeful with better control of her fluid retention and blood pressure, her symptoms might improve

## 2021-01-09 NOTE — Assessment & Plan Note (Signed)
She has sensation of vomiting, sensation of bloating and known history of constipation I recommend aggressive laxative for now

## 2021-01-09 NOTE — Assessment & Plan Note (Signed)
She has persistent anemia, stable Observe closely for now 

## 2021-01-09 NOTE — Assessment & Plan Note (Signed)
I am concerned about her symptoms She felt bloated, had recent vomiting and suprapubic pain I will proceed to order some blood work, urinalysis and urine culture I recommend moving her CT scan to be done sooner and I will see her afterwards to review test results

## 2021-01-09 NOTE — Assessment & Plan Note (Signed)
She has symptoms of suprapubic pain, dysuria and frequency I will order urinalysis and urine culture to rule out infection I will call her with test results

## 2021-01-10 ENCOUNTER — Telehealth: Payer: Self-pay | Admitting: Oncology

## 2021-01-10 ENCOUNTER — Other Ambulatory Visit: Payer: Self-pay | Admitting: Hematology and Oncology

## 2021-01-10 DIAGNOSIS — N3000 Acute cystitis without hematuria: Secondary | ICD-10-CM

## 2021-01-10 DIAGNOSIS — N390 Urinary tract infection, site not specified: Secondary | ICD-10-CM | POA: Insufficient documentation

## 2021-01-10 MED ORDER — SULFAMETHOXAZOLE-TRIMETHOPRIM 800-160 MG PO TABS: 1.0000 | ORAL_TABLET | Freq: Two times a day (BID) | ORAL | 0 refills | Status: DC

## 2021-01-10 MED FILL — SULFAMETHOXAZOLE-TMP DS TAB: 800-160 | 7 days supply | Qty: 14 | Fill #0

## 2021-01-10 MED FILL — SPIRONOLACTONE 25 MG TABS: 25 | 30 days supply | Qty: 30 | Fill #0

## 2021-01-10 NOTE — Telephone Encounter (Signed)
Called [name] back and scheduled apt to see Dr. Alvy Bimler on 01/19/21 at 2 pm.  Also advised her that the urine culture is positive for a UTI and that Dr. Alvy Bimler has sent in a prescription for antibiotics to [facility].  She verbalized understanding and agreement of appointment and will pick up the prescription today.

## 2021-01-10 NOTE — Telephone Encounter (Signed)
[name] with new CT scan appointment on 01/19/21 at 9:30.  Also reviewed when she needs to drink the contrast before the scan.  Advised her that we will call back if we need to reschedule her appointment with Dr. Alvy Bimler on 2/1.  She verbalized understanding and agreement.

## 2021-01-11 LAB — URINE CULTURE: Culture: >=100000 — AB

## 2021-01-16 ENCOUNTER — Ambulatory Visit (HOSPITAL_COMMUNITY): Payer: Medicare HMO

## 2021-01-16 ENCOUNTER — Ambulatory Visit: Payer: Medicare HMO | Admitting: Hematology and Oncology

## 2021-01-19 ENCOUNTER — Inpatient Hospital Stay: Payer: Medicare HMO | Attending: Hematology and Oncology | Admitting: Hematology and Oncology

## 2021-01-19 ENCOUNTER — Ambulatory Visit (HOSPITAL_COMMUNITY)
Admission: RE | Admit: 2021-01-19 | Discharge: 2021-01-19 | Disposition: A | Discharge status: Home / Self Care | Payer: Medicare HMO | Source: Ambulatory Visit | Attending: Hematology and Oncology | Admitting: Hematology and Oncology

## 2021-01-19 ENCOUNTER — Other Ambulatory Visit: Payer: Self-pay

## 2021-01-19 ENCOUNTER — Encounter: Payer: Self-pay | Admitting: Hematology and Oncology

## 2021-01-19 ENCOUNTER — Other Ambulatory Visit: Payer: Self-pay | Admitting: Hematology and Oncology

## 2021-01-19 DIAGNOSIS — Z5111 Encounter for antineoplastic chemotherapy: Secondary | ICD-10-CM | POA: Insufficient documentation

## 2021-01-19 DIAGNOSIS — C539 Malignant neoplasm of cervix uteri, unspecified: Secondary | ICD-10-CM | POA: Diagnosis not present

## 2021-01-19 DIAGNOSIS — G893 Neoplasm related pain (acute) (chronic): Secondary | ICD-10-CM | POA: Diagnosis not present

## 2021-01-19 DIAGNOSIS — C541 Malignant neoplasm of endometrium: Secondary | ICD-10-CM | POA: Insufficient documentation

## 2021-01-19 DIAGNOSIS — Z7189 Other specified counseling: Secondary | ICD-10-CM

## 2021-01-19 DIAGNOSIS — R112 Nausea with vomiting, unspecified: Secondary | ICD-10-CM | POA: Diagnosis not present

## 2021-01-19 DIAGNOSIS — T451X5A Adverse effect of antineoplastic and immunosuppressive drugs, initial encounter: Secondary | ICD-10-CM

## 2021-01-19 DIAGNOSIS — G620 Drug-induced polyneuropathy: Secondary | ICD-10-CM | POA: Diagnosis not present

## 2021-01-19 MED ORDER — IOHEXOL 300 MG/ML  SOLN
100.0000 mL | Freq: Once | INTRAMUSCULAR | Status: AC | PRN
  Administered 2021-01-19: 100 mL via INTRAVENOUS

## 2021-01-19 MED ORDER — MORPHINE SULFATE 15 MG PO TABS
15.0000 mg | ORAL_TABLET | ORAL | 0 refills | Status: DC | PRN
Start: 2021-01-19 — End: 2021-01-19

## 2021-01-19 MED FILL — MORPHINE SULFATE IR 15 MG T: 15 | 15 days supply | Qty: 90 | Fill #0

## 2021-01-19 NOTE — Assessment & Plan Note (Signed)
If she chooses to be treated with docetaxel, I will have to dose reduce her due to risk of neuropathy

## 2021-01-19 NOTE — Assessment & Plan Note (Signed)
We have long discussion about goals of care We discussed prognosis in general with or without treatment

## 2021-01-19 NOTE — Assessment & Plan Note (Signed)
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, hence the decision of immunotherapy is not to be taken likely I reviewed the current guidelines with the patient We discussed potential combination of pembrolizumab with Lenvima versus carboplatin with docetaxel versus single agent liposomal doxorubicin  With pembrolizumab, it could potentially exacerbate her sarcoidosis She needs to be taper off prednisone and that could cause adrenal crisis if she has adrenal insufficiency She also has poorly controlled hypertension  With carboplatin and docetaxel, she could experience worsening neuropathy and severe pancytopenia requiring transfusion support and potential risk of infection  With liposomal doxorubicin, it is generally well-tolerated and could preserve her quality of life but expected benefit is range in the region of 25% Ultimately, she is undecided The patient requested that I call her next Wednesday for final decision

## 2021-01-19 NOTE — Progress Notes (Signed)
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, hence the decision of immunotherapy is not to be taken likely I reviewed the current guidelines with the patient We discussed potential combination of pembrolizumab with Lenvima versus carboplatin with docetaxel versus single agent liposomal doxorubicin  With pembrolizumab, it could potentially exacerbate her sarcoidosis She needs to be taper off prednisone and that could cause adrenal crisis if she has adrenal insufficiency She also has poorly controlled hypertension  With carboplatin and docetaxel, she could experience worsening neuropathy and severe pancytopenia requiring transfusion support and potential risk of infection  With liposomal doxorubicin, it is generally well-tolerated and could preserve her quality of life but expected benefit is range in the region of 25% Ultimately, she is undecided The patient requested that I call her next Wednesday for final decision  Cancer associated pain I refill her prescription morphine sulfate I warned her about risk of constipation  Peripheral neuropathy due to chemotherapy [facility]) If she chooses to be treated with docetaxel, I will have to dose reduce her due to risk of neuropathy  Goals of care, counseling/discussion We have long discussion about goals of care We discussed prognosis in general with or without treatment   No orders of the defined types were placed in this encounter.   All  questions were answered. The patient knows to call the clinic with any problems, questions or concerns. The total time spent in the appointment was 80 minutes encounter with patients including review of chart and various tests results, discussions about plan of care and coordination of care plan   [name], MD 01/19/2021 3:03 PM  INTERVAL HISTORY: Please see below for problem oriented charting. She returns with her family to review test results Her pain is reasonably controlled with morphine sulfate once or twice a day With spironolactone, her leg swelling has improved She has minimum constipation The patient and family has numerous questions about side effects of treatment and what to expect She has not been feeling well overall and still have intermittent pain but generally she felt that her pain is well controlled  SUMMARY OF ONCOLOGIC HISTORY: Oncology History Overview Note  ER/PR neg, MSI stable   Endometrial cancer ([place])  06/15/2018 Surgery   Surgeon: Donaciano Eva     Operation: Robotic-assisted laparoscopic total hysterectomy with bilateral salpingoophorectomy, SLN biopsy     Operative Findings:  : 6cm uterus, normal tubes and ovaries, omentum adherent to the anterior abdominal wall and uterine fundus. No suspicious nodes.   06/15/2018 Pathology Results   Procedure: Total hysterectomy with bilateral salpingo-oophorectomy. Bilateral obturator sentinel lymph node biopsies. Histologic type: Endometrioid adenocarcinoma. Histologic Grade: FIGO grade 1. Myometrial invasion: Depth of invasion: 15 mm Myometrial thickness: 20 mm Uterine Serosa Involvement: Not identified. Cervical stromal involvement: Not identified. Extent of involvement of other organs: Uninvolved. Lymphovascular invasion: Present. Regional Lymph Nodes: Examined: 2 Sentinel 0 Non-sentinel 2 Total Lymph nodes with metastasis: 0 Isolated tumor cells (< 0.2 mm): 2 Micrometastasis: (> 0.2 mm and < 2.0  mm): 0 Macrometastasis: (> 2.0 mm): 0 Extracapsular extension: N/A. Tumor block  for ancillary studies: 4Q-S. MMR / MSI testing: Will be ordered. Pathologic Stage Classification (pTNM, AJCC 8th edition): pT1b, pN0(i+)   07/20/2018 Imaging   1. Surgical changes from recent hysterectomy. No findings suspicious for residual tumor or adenopathy in the pelvis. 2. No CT findings to suggest omental, peritoneal surface or solid organ metastatic disease in the abdomen. 3. Changes consistent with known sarcoidosis in the chest with bulky calcified mediastinal and hilar lymph nodes and interstitial lung disease. I do not see any definite CT findings to suggest metastatic disease involving the chest.   08/19/2018 - 09/17/2018 Radiation Therapy   08/19/18 - 09/17/18: Vaginal Cuff, 3 cm cylinder with treatment length of 3 cm / 30 Gy delivered in 5 fractions of 6 Gy, Brachytherapy / HDR, Iridium-192   05/2005: Left Chest Wall keloid   04/2004 - 05/2004: Anus, pelvis /inguinal ( Dr. Tammi Klippel)   01/19/2020 Imaging   1. No definite CT findings of the abdomen or pelvis to explain pain. 2. The sigmoid colon and rectum are decompressed although slightly thickened appearing, suggestive of nonspecific infectious, inflammatory, or ischemic colitis. Correlate for referable clinical symptoms. 3. There is trace, nonspecific free fluid in the low pelvis, which may be reactive, although would be difficult to distinguish from postoperative and post treatment soft tissue change in the pelvis given history of hysterectomy and rectal cancer. 4. Unchanged prominent retroperitoneal and iliac lymph nodes. No new findings suspicious for metastatic disease in the abdomen or pelvis. 5. Bibasilar pulmonary fibrosis and mediastinal and hilar lymphadenopathy, better evaluated by prior CT chest and consistent with patient diagnosis of sarcoidosis.       04/18/2020 Imaging   Venous Doppler Left:  - No evidence of deep vein thrombosis seen in the  left lower extremity, from the common femoral through the popliteal veins.  - No evidence of superficial venous thrombosis in the left lower extremity.  - There is no evidence of venous reflux seen in the left lower extremity.  - Rouleax flow noted in the superficial system.    05/18/2020 PET scan   1. Complex scan with a combination of hypermetabolic granulomatous disease (sarcoidosis) as well as hypermetabolic metastatic endometrial carcinoma. 2. Hypermetabolic mediastinal hilar lymph nodes and pulmonary nodularity with bronchovascular thickening is consistent with sarcoidosis. 3. Solitary hypermetabolic nodule in the medial LEFT lower lobe, new from prior with intense metabolic activity, is concerning for a metastatic deposit to the LEFT lower lobe. 4. Ill-defined hypermetabolic soft tissue along the LEFT operator space is most consistent with local recurrence of endometrial carcinoma. There is entrapment of the LEFT ureter by this hypermetabolic soft tissue with mild hydroureter and hydronephrosis on the LEFT. 5. Additionally there appears to be direct extension from the hypermetabolic left operator space soft tissue to the sigmoid colon with there is a well-defined hypermetabolic lesion in the colon. 6. More distant hypermetabolic peritoneal metastasis along the ventral peritoneal surface of the RIGHT abdomen beneath RIGHT rectus abdominus muscle. 7. Hypermetabolic lymph nodes in the porta hepatis are favored related to sarcoidosis.   05/19/2020 Cancer Staging   Staging form: Corpus Uteri - Carcinoma and Carcinosarcoma, AJCC 8th Edition - Clinical stage from 05/19/2020: Stage IVB (rcT1, cN2a, cM1) - Signed by [name], MD on 05/19/2020   05/26/2020 Procedure   Successful placement of a right internal jugular approach power injectable Port-A-Cath. The catheter is ready for immediate use   05/29/2020 - 09/27/2020 Chemotherapy   The patient had carboplatin and taxol for chemotherapy treatment.  07/31/2020 Imaging   1. Asymmetric soft tissue mass extending from the left vaginal cuff has decreased in size in the interval. No residual peritoneal nodularity. Pulmonary metastases are stable with exception of a minimally improved left lower lobe nodule. 2. Stable soft tissue nodule associated with the right rectus abdominus muscle. 3. Interval resolution of left hydronephrosis. 4. Calcified mediastinal and hilar lymph nodes, together with pulmonary parenchymal fibrosis, findings which are in keeping with patient's known diagnosis of sarcoid. 5. Aortic atherosclerosis (ICD10-I70.0). Coronary artery calcification.   10/27/2020 Imaging   1. Essentially stable appearance of the soft tissue density nodule posteriorly in the left rectus abdominus muscle, currently 1.8 by 1.2 cm, previously 11 mm. 2. Stable calcified adenopathy in the mediastinum and hila, compatible with prior granulomatous disease. 3. Stable small right lower lobe pulmonary nodules. 4. Stable soft tissue density prominence along the left vaginal cuff extending up towards the adnexa, probably therapy related. 5. Other imaging findings of potential clinical significance: Coronary atherosclerosis. Small type 1 hiatal hernia. Stable subpleural reticulation favoring the lung bases, possibly from mild fibrosis. 6. Aortic atherosclerosis.   01/19/2021 Imaging   1. Status post hysterectomy. Interval enlargement of soft tissue adjacent to the left aspect of the vaginal cuff and bladder, measuring approximately 4.3 x 2.9 cm in axial section, previously previously previously 3.7 x 1.8 cm when measured similarly. Extensive soft tissue thickening and stranding throughout the low pelvis. 2. Interval enlargement of a metastatic nodule within the right rectus abdominus. New small peritoneal nodule anterior to the sigmoid colon. 3. Interval enlargement of retroperitoneal retroperitoneal lymph nodes. 4. Multiple small bilateral pulmonary nodules,  several of which are new and enlarged. 5. Constellation of findings above is consistent with worsened metastatic disease. 6. New left hydronephrosis and hydroureter, the distal left ureter obstructed by soft tissue near the left aspect of the urinary bladder and vaginal cuff. 7. Unchanged enlarged, partially calcified mediastinal and hilar lymph nodes,, in keeping with prior granulomatous infection or sarcoidosis. 8. Mild underlying pulmonary fibrosis, in a UIP pattern. 9. Coronary artery disease.   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       REVIEW OF SYSTEMS:   Constitutional: Denies fevers, chills or abnormal weight loss Eyes: Denies blurriness of vision Ears, nose, mouth, throat, and face: Denies mucositis or sore throat Respiratory: Denies cough, dyspnea or wheezes Cardiovascular: Denies palpitation, chest discomfort  Skin: Denies abnormal skin rashes Behavioral/Psych: Mood is stable, no new changes  All other systems were reviewed with the patient and are negative.  I have reviewed the past medical history, past surgical history, social history and family history with the patient and they are unchanged from previous note.  ALLERGIES:  is allergic to shellfish allergy and penicillins.  MEDICATIONS:  Current Outpatient Medications  Medication Sig Dispense Refill  . morphine (MSIR) 15 MG tablet Take 1 tablet (15 mg total) by mouth every 4 (four) hours as needed for severe pain. 90 tablet 0  . albuterol (VENTOLIN HFA) 108  (90 Base) MCG/ACT inhaler INHALE 2 PUFFS INTO THE LUNGS EVERY 6  HOURS AS NEEDED FOR WHEEZING OR SHORTNESS OF BREATH (NEED MD APPOINTMENT) 3 each 0  . bisoprolol (ZEBETA) 10 MG tablet Take 1 tablet (10 mg total) by mouth in the morning and at bedtime. 180 tablet 0  . budesonide-formoterol (SYMBICORT) 80-4.5 MCG/ACT inhaler Inhale 2 puffs into the lungs 2 (two) times daily. 3 Inhaler 3  . desonide (DESOWEN) 0.05 % lotion Apply topically 2 (two) times daily. Continue following with dermatologist 59 mL 0  . dextromethorphan (DELSYM) 30 MG/5ML liquid Take by mouth as needed for cough.     . hydrochlorothiazide (HYDRODIURIL) 25 MG tablet Take 25 mg by mouth daily.    . hydrocortisone 2.5 % cream Apply 1 application topically 2 (two) times daily.    Marland Kitchen loratadine (CLARITIN) 10 MG tablet Take 10 mg by mouth daily as needed for allergies.    Marland Kitchen ondansetron (ZOFRAN) 8 MG tablet Take 1 tablet (8 mg total) by mouth every 8 (eight) hours as needed for nausea. 30 tablet 3  . pantoprazole (PROTONIX) 40 MG tablet Take 1 tablet (40 mg total) by mouth daily. Patient needs appt for further refills. 90 tablet 0  . polyethylene glycol (MIRALAX / GLYCOLAX) 17 g packet Take 17 g by mouth daily.    . predniSONE (DELTASONE) 10 MG tablet Take 0.5 tablets (5 mg total) by mouth daily with breakfast. Patient needs appt for further refills 90 tablet 0  . prochlorperazine (COMPAZINE) 10 MG tablet Take 1 tablet (10 mg total) by mouth every 6 (six) hours as needed for nausea or vomiting. 30 tablet 3  . RABEprazole (ACIPHEX) 20 MG tablet Take 1 tablet (20 mg total) by mouth daily before breakfast. 90 tablet 3  . spironolactone (ALDACTONE) 25 MG tablet Take 1 tablet (25 mg total) by mouth daily. 30 tablet 1  . vitamin C (ASCORBIC ACID) 500 MG tablet Take 500 mg by mouth daily.     No current facility-administered medications for this visit.    PHYSICAL EXAMINATION: ECOG PERFORMANCE STATUS: 1 - Symptomatic but completely  ambulatory  Vitals:   01/19/21 1405  BP: (!) 150/80  Pulse: 68  Resp: 18  Temp: 98.6 F (37 C)  SpO2: 97%   Filed Weights   01/19/21 1405  Weight: 219 lb 9.6 oz (99.6 kg)    GENERAL:alert, no distress and comfortable NEURO: alert & oriented x 3 with fluent speech, no focal motor/sensory deficits  LABORATORY DATA:  I have reviewed the data as listed    Component Value Date/Time   NA 144 01/09/2021 1447   K 3.4 (L) 01/09/2021 1447   CL 108 01/09/2021 1447   CO2 25 01/09/2021 1447   GLUCOSE 157 (H) 01/09/2021 1447   BUN 18 01/09/2021 1447   CREATININE 0.92 01/09/2021 1447   CALCIUM 9.3 01/09/2021 1447   PROT 8.4 (H) 01/09/2021 1447   ALBUMIN 3.8 01/09/2021 1447   AST 14 (L) 01/09/2021 1447   ALT 13 01/09/2021 1447   ALKPHOS 94 01/09/2021 1447   BILITOT 0.5 01/09/2021 1447   GFRNONAA >60 01/09/2021 1447   GFRAA >60 09/08/2020 0819    No results found for: SPEP, UPEP  Lab Results  Component Value Date   WBC 6.8 01/09/2021   NEUTROABS 4.8 01/09/2021   HGB 10.7 (L) 01/09/2021   HCT 31.5 (L) 01/09/2021   MCV 98.1 01/09/2021   PLT 191 01/09/2021      Chemistry      Component Value Date/Time   NA 144 01/09/2021 1447  K 3.4 (L) 01/09/2021 1447   CL 108 01/09/2021 1447   CO2 25 01/09/2021 1447   BUN 18 01/09/2021 1447   CREATININE 0.92 01/09/2021 1447      Component Value Date/Time   CALCIUM 9.3 01/09/2021 1447   ALKPHOS 94 01/09/2021 1447   AST 14 (L) 01/09/2021 1447   ALT 13 01/09/2021 1447   BILITOT 0.5 01/09/2021 1447       RADIOGRAPHIC STUDIES: I have reviewed multiple imaging studies with the patient and sister I have personally reviewed the radiological images as listed and agreed with the findings in the report. CT CHEST ABDOMEN PELVIS W CONTRAST  Result Date: 01/19/2021 CLINICAL DATA:  Cervical cancer restaging, last chemotherapy November 2021 EXAM: CT CHEST, ABDOMEN, AND PELVIS WITH CONTRAST TECHNIQUE: Multidetector CT imaging of the chest,  abdomen and pelvis was performed following the standard protocol during bolus administration of intravenous contrast. CONTRAST:  141m OMNIPAQUE IOHEXOL 300 MG/ML SOLN, additional oral enteric contrast COMPARISON:  10/27/2020 FINDINGS: CT CHEST FINDINGS Cardiovascular: Right chest port catheter. Aortic atherosclerosis. Normal heart size. Left coronary artery calcifications. No pericardial effusion. Mediastinum/Nodes: Numerous unchanged enlarged, partially calcified mediastinal and hilar lymph nodes, largest index subcarinal lymph node measuring 4.1 x 2.1 cm (series 2, image 28). Thyroid gland, trachea, and esophagus demonstrate no significant findings. Lungs/Pleura: Redemonstrated mild pulmonary fibrosis in a pattern with apical to basal gradient featuring irregular peripheral interstitial opacity, septal thickening, subpleural bronchiolectasis, and probable honeycombing. There are multiple small bilateral pulmonary nodules, several of which appear to have increased in size compared to prior examination, for example a nodule of the peripheral right lower lobe measuring 0.9 cm, previously 0.5 cm (series 7, image 86). There are occasional new nodules, for example a new 7 mm nodule in the lingula (series 7, image 86). No pleural effusion or pneumothorax. Musculoskeletal: No chest wall mass or suspicious bone lesions identified. CT ABDOMEN PELVIS FINDINGS Hepatobiliary: No focal liver abnormality is seen. Status post cholecystectomy. No biliary dilatation. Pancreas: Unremarkable. No pancreatic ductal dilatation or surrounding inflammatory changes. Spleen: Normal in size without significant abnormality. Adrenals/Urinary Tract: Adrenal glands are unremarkable. There is new left hydronephrosis and hydroureter, the distal left ureter obstructed by soft tissue near the left aspect of the urinary bladder and vaginal cuff (series 2, image 101). Bladder is unremarkable. Stomach/Bowel: Stomach is within normal limits. Appendix  appears normal. No evidence of bowel wall thickening, distention, or inflammatory changes. Vascular/Lymphatic: Aortic atherosclerosis. Numerous enlarged retroperitoneal, left ovarian, and left pelvic sidewall lymph nodes. Left retroperitoneal nodes are increased in size compared to prior examination, largest node measuring 2.0 x 1.4 cm, previously 0.9 x 0.8 cm (series 2, image 69). Reproductive: Status post hysterectomy. Interval enlargement of soft tissue adjacent to the left aspect of the vaginal cuff and bladder, measuring approximately 4.3 x 2.9 cm in axial section, previously previously 3.7 x 1.8 cm when measured similarly (series 2, image 101). Extensive soft tissue thickening and stranding throughout the low pelvis. Other: Interval enlargement of a metastatic nodule within the right rectus abdominus, measuring 2.0 x 2.0 cm, previously 1.6 x 1.5 cm (series 2, image 79). New small peritoneal nodule anterior to the sigmoid colon measuring 0.6 cm (series 2, image 92). No abdominopelvic ascites. Musculoskeletal: No acute or significant osseous findings. IMPRESSION: 1. Status post hysterectomy. Interval enlargement of soft tissue adjacent to the left aspect of the vaginal cuff and bladder, measuring approximately 4.3 x 2.9 cm in axial section, previously previously previously 3.7 x 1.8 cm when  measured similarly. Extensive soft tissue thickening and stranding throughout the low pelvis. 2. Interval enlargement of a metastatic nodule within the right rectus abdominus. New small peritoneal nodule anterior to the sigmoid colon. 3. Interval enlargement of retroperitoneal retroperitoneal lymph nodes. 4. Multiple small bilateral pulmonary nodules, several of which are new and enlarged. 5. Constellation of findings above is consistent with worsened metastatic disease. 6. New left hydronephrosis and hydroureter, the distal left ureter obstructed by soft tissue near the left aspect of the urinary bladder and vaginal cuff. 7.  Unchanged enlarged, partially calcified mediastinal and hilar lymph nodes,, in keeping with prior granulomatous infection or sarcoidosis. 8. Mild underlying pulmonary fibrosis, in a UIP pattern. 9. Coronary artery disease. Aortic Atherosclerosis (ICD10-I70.0). Electronically Signed   By: Eddie Candle M.D.   On: 01/19/2021 12:50

## 2021-01-19 NOTE — Assessment & Plan Note (Signed)
I refill her prescription morphine sulfate I warned her about risk of constipation

## 2021-01-24 ENCOUNTER — Telehealth: Payer: Self-pay | Admitting: Hematology and Oncology

## 2021-01-24 ENCOUNTER — Other Ambulatory Visit: Payer: Self-pay | Admitting: Hematology and Oncology

## 2021-01-24 ENCOUNTER — Encounter: Payer: Self-pay | Admitting: General Practice

## 2021-01-24 ENCOUNTER — Telehealth: Payer: Self-pay

## 2021-01-24 DIAGNOSIS — C7802 Secondary malignant neoplasm of left lung: Secondary | ICD-10-CM

## 2021-01-24 DIAGNOSIS — C541 Malignant neoplasm of endometrium: Secondary | ICD-10-CM

## 2021-01-24 DIAGNOSIS — C775 Secondary and unspecified malignant neoplasm of intrapelvic lymph nodes: Secondary | ICD-10-CM

## 2021-01-24 MED ORDER — DEXAMETHASONE 4 MG PO TABS: 8.0000 mg | ORAL_TABLET | Freq: Two times a day (BID) | ORAL | 1 refills | Status: DC

## 2021-01-24 MED FILL — DEXAMETHASONE 4 MG TABLET: 4 | 12 days supply | Qty: 30 | Fill #0

## 2021-01-24 NOTE — Telephone Encounter (Signed)
Scheduled per 2/9 sch msg. Called and spoke with pt, confirmed 2/18 appts

## 2021-01-24 NOTE — Telephone Encounter (Signed)
She called and left a message to call her.  Called back. She has decided to get the offered treatment of Carboplatin and Docetaxel. She will wait on call from scheduling to schedule.

## 2021-01-24 NOTE — Telephone Encounter (Signed)
Called and given below message. She verbalized understanding. 

## 2021-01-24 NOTE — Progress Notes (Signed)
DISCONTINUE ON PATHWAY REGIMEN - Uterine     A cycle is every 21 days:     Paclitaxel      Carboplatin   **Always confirm dose/schedule in your pharmacy ordering system**  REASON: Other Reason PRIOR TREATMENT: [id number]: Carboplatin AUC=6 + Paclitaxel 175 mg/m2 q21 Days Until Progression or Unacceptable Toxicity TREATMENT RESPONSE: Progressive Disease (PD)  START OFF PATHWAY REGIMEN - Uterine   [id number]:Carboplatin + Docetaxel (5/75) every 21 days:   A cycle is every 21 days:     Docetaxel      Carboplatin   **Always confirm dose/schedule in your pharmacy ordering system**  Patient Characteristics: Endometrioid, Recurrent/Progressive Disease, Second Line, Systemic Recurrence, < 6 Months Since Previous Chemotherapy, MSS/pMMR and TMB-L/Unknown Histology: Endometrioid Therapeutic Status: Recurrent or Progressive Disease Line of Therapy: Second Line Time to Recurrence: < 6 Months Since Previous Chemotherapy Microsatellite/Mismatch Repair Status: MSS/pMMR Tumor Mutational Burden (TMB): Unknown Intent of Therapy: Non-Curative / Palliative Intent, Discussed with Patient

## 2021-01-24 NOTE — Progress Notes (Signed)
[place] Spiritual Care Note  Referred by Bosie Clos for emotional support, as Ms Verner shared that she learned yesterday that a close friend from work just died of pancreatic cancer, which understandably stirs up her own vulnerability. Phoned Ms Woolen to reach out, giving my condolences, normalizing feelings, and introducing Spiritual Care as an additional layer of support. She was appreciative of call and care. We plan to follow up when she is at treatment on 2/18.   [place], [place], [facility] Pager [phone] Voicemail [phone]

## 2021-01-24 NOTE — Telephone Encounter (Signed)
OK Tell her to pick up prescription dexamethasone. She will not take her prednisone on the day she takes dexamethasone. I am hoping to get her Rx on Friday 2/18

## 2021-01-26 NOTE — Progress Notes (Signed)
Pharmacist Chemotherapy Monitoring - Initial Assessment    Anticipated start date: 02/02/21   Regimen:  . Are orders appropriate based on the patient's diagnosis, regimen, and cycle? Yes . Does the plan date match the patient's scheduled date? Yes . Is the sequencing of drugs appropriate? Yes . Are the premedications appropriate for the patient's regimen? Yes . Prior Authorization for treatment is: Approved o If applicable, is the correct biosimilar selected based on the patient's insurance? not applicable  Organ Function and Labs: Marland Kitchen Are dose adjustments needed based on the patient's renal function, hepatic function, or hematologic function? Yes . Are appropriate labs ordered prior to the start of patient's treatment? Yes . Other organ system assessment, if indicated: N/A . The following baseline labs, if indicated, have been ordered: N/A  Dose Assessment: . Are the drug doses appropriate? Yes . Are the following correct: o Drug concentrations Yes o IV fluid compatible with drug Yes o Administration routes Yes o Timing of therapy Yes . If applicable, does the patient have documented access for treatment and/or plans for port-a-cath placement? yes . If applicable, have lifetime cumulative doses been properly documented and assessed? not applicable Lifetime Dose Tracking  . Carboplatin: 3,710 mg = 0.01 % of the maximum lifetime dose of 999,999,999 mg  o   Toxicity Monitoring/Prevention: . The patient has the following take home antiemetics prescribed: Dexamethasone . The patient has the following take home medications prescribed: N/A . Medication allergies and previous infusion related reactions, if applicable, have been reviewed and addressed. Yes . The patient's current medication list has been assessed for drug-drug interactions with their chemotherapy regimen. no significant drug-drug interactions were identified on review.  Order Review: . Are the treatment plan orders signed?  Yes . Is the patient scheduled to see a provider prior to their treatment? Yes  I verify that I have reviewed each item in the above checklist and answered each question accordingly.  Bunny Kleist K, [place], 01/26/2021  2:58 PM

## 2021-01-31 ENCOUNTER — Other Ambulatory Visit: Payer: Medicare HMO

## 2021-01-31 ENCOUNTER — Ambulatory Visit (HOSPITAL_COMMUNITY): Payer: Medicare HMO

## 2021-02-01 ENCOUNTER — Ambulatory Visit: Payer: Medicare HMO | Admitting: Hematology and Oncology

## 2021-02-02 ENCOUNTER — Inpatient Hospital Stay: Payer: Medicare HMO

## 2021-02-02 ENCOUNTER — Encounter: Payer: Self-pay | Admitting: General Practice

## 2021-02-02 ENCOUNTER — Telehealth: Payer: Self-pay | Admitting: Hematology and Oncology

## 2021-02-02 ENCOUNTER — Ambulatory Visit: Payer: Medicare HMO

## 2021-02-02 ENCOUNTER — Other Ambulatory Visit: Payer: Self-pay | Admitting: Hematology and Oncology

## 2021-02-02 ENCOUNTER — Inpatient Hospital Stay: Payer: Medicare HMO | Admitting: Hematology and Oncology

## 2021-02-02 ENCOUNTER — Other Ambulatory Visit: Payer: Self-pay

## 2021-02-02 ENCOUNTER — Encounter: Payer: Self-pay | Admitting: Hematology and Oncology

## 2021-02-02 VITALS — BP 171/84 | HR 63 | Temp 98.0°F | Resp 18

## 2021-02-02 DIAGNOSIS — C775 Secondary and unspecified malignant neoplasm of intrapelvic lymph nodes: Secondary | ICD-10-CM

## 2021-02-02 DIAGNOSIS — G620 Drug-induced polyneuropathy: Secondary | ICD-10-CM | POA: Diagnosis not present

## 2021-02-02 DIAGNOSIS — C541 Malignant neoplasm of endometrium: Secondary | ICD-10-CM | POA: Diagnosis not present

## 2021-02-02 DIAGNOSIS — C7802 Secondary malignant neoplasm of left lung: Secondary | ICD-10-CM

## 2021-02-02 DIAGNOSIS — R112 Nausea with vomiting, unspecified: Secondary | ICD-10-CM | POA: Diagnosis not present

## 2021-02-02 DIAGNOSIS — Z5111 Encounter for antineoplastic chemotherapy: Secondary | ICD-10-CM | POA: Diagnosis not present

## 2021-02-02 DIAGNOSIS — K5909 Other constipation: Secondary | ICD-10-CM

## 2021-02-02 DIAGNOSIS — T451X5A Adverse effect of antineoplastic and immunosuppressive drugs, initial encounter: Secondary | ICD-10-CM

## 2021-02-02 DIAGNOSIS — G893 Neoplasm related pain (acute) (chronic): Secondary | ICD-10-CM

## 2021-02-02 LAB — CBC WITH DIFFERENTIAL (CANCER CENTER ONLY)
Abs Immature Granulocytes: 0.04 10*3/uL (ref 0.00–0.07)
Basophils Absolute: 0 10*3/uL (ref 0.0–0.1)
Basophils Relative: 0 %
Eosinophils Absolute: 0 10*3/uL (ref 0.0–0.5)
Eosinophils Relative: 0 %
HCT: 28.5 % — ABNORMAL LOW (ref 36.0–46.0)
Hemoglobin: 10.1 g/dL — ABNORMAL LOW (ref 12.0–15.0)
Immature Granulocytes: 1 %
Lymphocytes Relative: 10 %
Lymphs Abs: 0.7 10*3/uL (ref 0.7–4.0)
MCH: 32.8 pg (ref 26.0–34.0)
MCHC: 35.4 g/dL (ref 30.0–36.0)
MCV: 92.5 fL (ref 80.0–100.0)
Monocytes Absolute: 0.4 10*3/uL (ref 0.1–1.0)
Monocytes Relative: 5 %
Neutro Abs: 5.9 10*3/uL (ref 1.7–7.7)
Neutrophils Relative %: 84 %
Platelet Count: 185 10*3/uL (ref 150–400)
RBC: 3.08 MIL/uL — ABNORMAL LOW (ref 3.87–5.11)
RDW: 14.1 % (ref 11.5–15.5)
WBC Count: 7 10*3/uL (ref 4.0–10.5)
nRBC: 0 % (ref 0.0–0.2)

## 2021-02-02 LAB — CMP (CANCER CENTER ONLY)
ALT: 14 U/L (ref 0–44)
AST: 13 U/L — ABNORMAL LOW (ref 15–41)
Albumin: 4 g/dL (ref 3.5–5.0)
Alkaline Phosphatase: 89 U/L (ref 38–126)
Anion gap: 12 (ref 5–15)
BUN: 20 mg/dL (ref 8–23)
CO2: 24 mmol/L (ref 22–32)
Calcium: 9.3 mg/dL (ref 8.9–10.3)
Chloride: 102 mmol/L (ref 98–111)
Creatinine: 1.07 mg/dL — ABNORMAL HIGH (ref 0.44–1.00)
GFR, Estimated: 59 mL/min — ABNORMAL LOW (ref >60)
Glucose, Bld: 214 mg/dL — ABNORMAL HIGH (ref 70–99)
Potassium: 3.8 mmol/L (ref 3.5–5.1)
Sodium: 138 mmol/L (ref 135–145)
Total Bilirubin: 0.5 mg/dL (ref 0.3–1.2)
Total Protein: 8.2 g/dL — ABNORMAL HIGH (ref 6.5–8.1)

## 2021-02-02 MED ORDER — SODIUM CHLORIDE 0.9 % IV SOLN
150.0000 mg | Freq: Once | INTRAVENOUS | Status: AC
  Administered 2021-02-02: 150 mg via INTRAVENOUS
  Filled 2021-02-02: qty 150

## 2021-02-02 MED ORDER — SODIUM CHLORIDE 0.9 % IV SOLN
10.0000 mg | Freq: Once | INTRAVENOUS | Status: AC
  Administered 2021-02-02: 10 mg via INTRAVENOUS
  Filled 2021-02-02: qty 10

## 2021-02-02 MED ORDER — SODIUM CHLORIDE 0.9% FLUSH
10.0000 mL | INTRAVENOUS | Status: DC | PRN
  Administered 2021-02-02: 10 mL
  Filled 2021-02-02: qty 10

## 2021-02-02 MED ORDER — PALONOSETRON HCL INJECTION 0.25 MG/5ML
0.2500 mg | Freq: Once | INTRAVENOUS | Status: AC
  Administered 2021-02-02: 0.25 mg via INTRAVENOUS

## 2021-02-02 MED ORDER — FAMOTIDINE IN NACL 20-0.9 MG/50ML-% IV SOLN
INTRAVENOUS | Status: AC
  Filled 2021-02-02: qty 50

## 2021-02-02 MED ORDER — DIPHENHYDRAMINE HCL 50 MG/ML IJ SOLN
INTRAMUSCULAR | Status: AC
  Filled 2021-02-02: qty 1

## 2021-02-02 MED ORDER — DIPHENHYDRAMINE HCL 50 MG/ML IJ SOLN
25.0000 mg | Freq: Once | INTRAMUSCULAR | Status: AC
  Administered 2021-02-02: 25 mg via INTRAVENOUS

## 2021-02-02 MED ORDER — PALONOSETRON HCL INJECTION 0.25 MG/5ML
INTRAVENOUS | Status: AC
  Filled 2021-02-02: qty 5

## 2021-02-02 MED ORDER — SODIUM CHLORIDE 0.9 % IV SOLN
60.0000 mg/m2 | Freq: Once | INTRAVENOUS | Status: AC
  Administered 2021-02-02: 130 mg via INTRAVENOUS
  Filled 2021-02-02: qty 13

## 2021-02-02 MED ORDER — FAMOTIDINE IN NACL 20-0.9 MG/50ML-% IV SOLN
20.0000 mg | Freq: Once | INTRAVENOUS | Status: AC
  Administered 2021-02-02: 20 mg via INTRAVENOUS

## 2021-02-02 MED ORDER — PROCHLORPERAZINE MALEATE 10 MG PO TABS: 10.0000 mg | ORAL_TABLET | Freq: Four times a day (QID) | ORAL | 3 refills | Status: DC | PRN

## 2021-02-02 MED ORDER — SODIUM CHLORIDE 0.9 % IV SOLN
Freq: Once | INTRAVENOUS | Status: AC
  Filled 2021-02-02: qty 250

## 2021-02-02 MED ORDER — HEPARIN SOD (PORK) LOCK FLUSH 100 UNIT/ML IV SOLN
500.0000 [IU] | Freq: Once | INTRAVENOUS | Status: AC | PRN
  Administered 2021-02-02: 500 [IU]
  Filled 2021-02-02: qty 5

## 2021-02-02 MED ORDER — SODIUM CHLORIDE 0.9 % IV SOLN
553.5000 mg | Freq: Once | INTRAVENOUS | Status: AC
  Administered 2021-02-02: 550 mg via INTRAVENOUS
  Filled 2021-02-02: qty 55

## 2021-02-02 MED ORDER — SODIUM CHLORIDE 0.9% FLUSH
10.0000 mL | Freq: Once | INTRAVENOUS | Status: AC
  Administered 2021-02-02: 10 mL
  Filled 2021-02-02: qty 10

## 2021-02-02 MED FILL — PROCHLORPERAZINE 10 MG TAB: 10 | 15 days supply | Qty: 60 | Fill #0

## 2021-02-02 NOTE — Patient Instructions (Signed)
Docetaxel injection What is this medicine? DOCETAXEL (doe se TAX el) is a chemotherapy drug. It targets fast dividing cells, like cancer cells, and causes these cells to die. This medicine is used to treat many types of cancers like breast cancer, certain stomach cancers, head and neck cancer, lung cancer, and prostate cancer. This medicine may be used for other purposes; ask your health care provider or pharmacist if you have questions. COMMON BRAND NAME(S): Docefrez, Taxotere What should I tell my health care provider before I take this medicine? They need to know if you have any of these conditions:  infection (especially a virus infection such as chickenpox, cold sores, or herpes)  liver disease  low blood counts, like low white cell, platelet, or red cell counts  an unusual or allergic reaction to docetaxel, polysorbate 80, other chemotherapy agents, other medicines, foods, dyes, or preservatives  pregnant or trying to get pregnant  breast-feeding How should I use this medicine? This drug is given as an infusion into a vein. It is administered in a hospital or clinic by a specially trained health care professional. Talk to your pediatrician regarding the use of this medicine in children. Special care may be needed. Overdosage: If you think you have taken too much of this medicine contact a poison control center or emergency room at once. NOTE: This medicine is only for you. Do not share this medicine with others. What if I miss a dose? It is important not to miss your dose. Call your doctor or health care professional if you are unable to keep an appointment. What may interact with this medicine? Do not take this medicine with any of the following medications:  live virus vaccines This medicine may also interact with the following medications:  aprepitant  certain antibiotics like erythromycin or clarithromycin  certain antivirals for HIV or hepatitis  certain medicines for  fungal infections like fluconazole, itraconazole, ketoconazole, posaconazole, or voriconazole  cimetidine  ciprofloxacin  conivaptan  cyclosporine  dronedarone  fluvoxamine  grapefruit juice  imatinib  verapamil This list may not describe all possible interactions. Give your health care provider a list of all the medicines, herbs, non-prescription drugs, or dietary supplements you use. Also tell them if you smoke, drink alcohol, or use illegal drugs. Some items may interact with your medicine. What should I watch for while using this medicine? Your condition will be monitored carefully while you are receiving this medicine. You will need important blood work done while you are taking this medicine. Call your doctor or health care professional for advice if you get a fever, chills or sore throat, or other symptoms of a cold or flu. Do not treat yourself. This drug decreases your body's ability to fight infections. Try to avoid being around people who are sick. Some products may contain alcohol. Ask your health care professional if this medicine contains alcohol. Be sure to tell all health care professionals you are taking this medicine. Certain medicines, like metronidazole and disulfiram, can cause an unpleasant reaction when taken with alcohol. The reaction includes flushing, headache, nausea, vomiting, sweating, and increased thirst. The reaction can last from 30 minutes to several hours. You may get drowsy or dizzy. Do not drive, use machinery, or do anything that needs mental alertness until you know how this medicine affects you. Do not stand or sit up quickly, especially if you are an older patient. This reduces the risk of dizzy or fainting spells. Alcohol may interfere with the effect of this  medicine. Talk to your health care professional about your risk of cancer. You may be more at risk for certain types of cancer if you take this medicine. Do not become pregnant while taking  this medicine or for 6 months after stopping it. Women should inform their doctor if they wish to become pregnant or think they might be pregnant. There is a potential for serious side effects to an unborn child. Talk to your health care professional or pharmacist for more information. Do not breast-feed an infant while taking this medicine or for 1 week after stopping it. Males who get this medicine must use a condom during sex with females who can get pregnant. If you get a woman pregnant, the baby could have birth defects. The baby could die before they are born. You will need to continue wearing a condom for 3 months after stopping the medicine. Tell your health care provider right away if your partner becomes pregnant while you are taking this medicine. This may interfere with the ability to father a child. You should talk to your doctor or health care professional if you are concerned about your fertility. What side effects may I notice from receiving this medicine? Side effects that you should report to your doctor or health care professional as soon as possible:  allergic reactions like skin rash, itching or hives, swelling of the face, lips, or tongue  blurred vision  breathing problems  changes in vision  low blood counts - This drug may decrease the number of white blood cells, red blood cells and platelets. You may be at increased risk for infections and bleeding.  nausea and vomiting  pain, redness or irritation at site where injected  pain, tingling, numbness in the hands or feet  redness, blistering, peeling, or loosening of the skin, including inside the mouth  signs of decreased platelets or bleeding - bruising, pinpoint red spots on the skin, black, tarry stools, nosebleeds  signs of decreased red blood cells - unusually weak or tired, fainting spells, lightheadedness  signs of infection - fever or chills, cough, sore throat, pain or difficulty passing urine  swelling  of the ankle, feet, hands Side effects that usually do not require medical attention (report to your doctor or health care professional if they continue or are bothersome):  constipation  diarrhea  fingernail or toenail changes  hair loss  loss of appetite  mouth sores  muscle pain This list may not describe all possible side effects. Call your doctor for medical advice about side effects. You may report side effects to FDA at 1-800-FDA-1088. Where should I keep my medicine? This drug is given in a hospital or clinic and will not be stored at home. NOTE: This sheet is a summary. It may not cover all possible information. If you have questions about this medicine, talk to your doctor, pharmacist, or health care provider.  2021 Elsevier/Gold Standard (2019-11-01 19:50:31) Carboplatin injection What is this medicine? CARBOPLATIN (KAR boe pla tin) is a chemotherapy drug. It targets fast dividing cells, like cancer cells, and causes these cells to die. This medicine is used to treat ovarian cancer and many other cancers. This medicine may be used for other purposes; ask your health care provider or pharmacist if you have questions. COMMON BRAND NAME(S): Paraplatin What should I tell my health care provider before I take this medicine? They need to know if you have any of these conditions:  blood disorders  hearing problems  kidney disease  recent or ongoing radiation therapy  an unusual or allergic reaction to carboplatin, cisplatin, other chemotherapy, other medicines, foods, dyes, or preservatives  pregnant or trying to get pregnant  breast-feeding How should I use this medicine? This drug is usually given as an infusion into a vein. It is administered in a hospital or clinic by a specially trained health care professional. Talk to your pediatrician regarding the use of this medicine in children. Special care may be needed. Overdosage: If you think you have taken too much  of this medicine contact a poison control center or emergency room at once. NOTE: This medicine is only for you. Do not share this medicine with others. What if I miss a dose? It is important not to miss a dose. Call your doctor or health care professional if you are unable to keep an appointment. What may interact with this medicine?  medicines for seizures  medicines to increase blood counts like filgrastim, pegfilgrastim, sargramostim  some antibiotics like amikacin, gentamicin, neomycin, streptomycin, tobramycin  vaccines Talk to your doctor or health care professional before taking any of these medicines:  acetaminophen  aspirin  ibuprofen  ketoprofen  naproxen This list may not describe all possible interactions. Give your health care provider a list of all the medicines, herbs, non-prescription drugs, or dietary supplements you use. Also tell them if you smoke, drink alcohol, or use illegal drugs. Some items may interact with your medicine. What should I watch for while using this medicine? Your condition will be monitored carefully while you are receiving this medicine. You will need important blood work done while you are taking this medicine. This drug may make you feel generally unwell. This is not uncommon, as chemotherapy can affect healthy cells as well as cancer cells. Report any side effects. Continue your course of treatment even though you feel ill unless your doctor tells you to stop. In some cases, you may be given additional medicines to help with side effects. Follow all directions for their use. Call your doctor or health care professional for advice if you get a fever, chills or sore throat, or other symptoms of a cold or flu. Do not treat yourself. This drug decreases your body's ability to fight infections. Try to avoid being around people who are sick. This medicine may increase your risk to bruise or bleed. Call your doctor or health care professional if you  notice any unusual bleeding. Be careful brushing and flossing your teeth or using a toothpick because you may get an infection or bleed more easily. If you have any dental work done, tell your dentist you are receiving this medicine. Avoid taking products that contain aspirin, acetaminophen, ibuprofen, naproxen, or ketoprofen unless instructed by your doctor. These medicines may hide a fever. Do not become pregnant while taking this medicine. Women should inform their doctor if they wish to become pregnant or think they might be pregnant. There is a potential for serious side effects to an unborn child. Talk to your health care professional or pharmacist for more information. Do not breast-feed an infant while taking this medicine. What side effects may I notice from receiving this medicine? Side effects that you should report to your doctor or health care professional as soon as possible:  allergic reactions like skin rash, itching or hives, swelling of the face, lips, or tongue  signs of infection - fever or chills, cough, sore throat, pain or difficulty passing urine  signs of decreased platelets or bleeding - bruising,  pinpoint red spots on the skin, black, tarry stools, nosebleeds  signs of decreased red blood cells - unusually weak or tired, fainting spells, lightheadedness  breathing problems  changes in hearing  changes in vision  chest pain  high blood pressure  low blood counts - This drug may decrease the number of white blood cells, red blood cells and platelets. You may be at increased risk for infections and bleeding.  nausea and vomiting  pain, swelling, redness or irritation at the injection site  pain, tingling, numbness in the hands or feet  problems with balance, talking, walking  trouble passing urine or change in the amount of urine Side effects that usually do not require medical attention (report to your doctor or health care professional if they continue or  are bothersome):  hair loss  loss of appetite  metallic taste in the mouth or changes in taste This list may not describe all possible side effects. Call your doctor for medical advice about side effects. You may report side effects to FDA at 1-800-FDA-1088. Where should I keep my medicine? This drug is given in a hospital or clinic and will not be stored at home. NOTE: This sheet is a summary. It may not cover all possible information. If you have questions about this medicine, talk to your doctor, pharmacist, or health care provider.  2021 Elsevier/Gold Standard (2008-03-08 14:38:05)

## 2021-02-02 NOTE — Assessment & Plan Note (Signed)
She has persistent abdominal pain I recommend her to take morphine sulfate on a regular basis I will reassess pain control next week

## 2021-02-02 NOTE — Assessment & Plan Note (Addendum)
She has pre-existing peripheral neuropathy We will monitor neuropathy carefully

## 2021-02-02 NOTE — Assessment & Plan Note (Signed)
She has history of constipation but denies recent constipation We discussed the importance of aggressive laxative therapy

## 2021-02-02 NOTE — Assessment & Plan Note (Signed)
She has regular nausea vomiting especially after meals I recommend her to take antiemetics before meals Clinically, she has lost some weight and I suspect she has dehydration I recommend IV fluid support the next few days

## 2021-02-02 NOTE — Progress Notes (Signed)
[facility] Spiritual Care Note  Followed up with [name] in infusion, bringing a bag of self-care goodies to help provide comfort and encouragement as she grieves the loss of her friend and deals with her own diagnosis and treatment. We plan to follow up by phone next week, which will allow for more privacy.   [place], [place], [facility] Pager [phone] Voicemail [phone]

## 2021-02-02 NOTE — Assessment & Plan Note (Signed)
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

## 2021-02-02 NOTE — Patient Instructions (Signed)
Implanted Port Insertion, Care After This sheet gives you information about how to care for yourself after your procedure. Your health care provider may also give you more specific instructions. If you have problems or questions, contact your health care provider. What can I expect after the procedure? After the procedure, it is common to have:  Discomfort at the port insertion site.  Bruising on the skin over the port. This should improve over 3-4 days. Follow these instructions at home: Port care  After your port is placed, you will get a manufacturer's information card. The card has information about your port. Keep this card with you at all times.  Take care of the port as told by your health care provider. Ask your health care provider if you or a family member can get training for taking care of the port at home. A home health care nurse may also take care of the port.  Make sure to remember what type of port you have. Incision care  Follow instructions from your health care provider about how to take care of your port insertion site. Make sure you: ? Wash your hands with soap and water before and after you change your bandage (dressing). If soap and water are not available, use hand sanitizer. ? Change your dressing as told by your health care provider. ? Leave stitches (sutures), skin glue, or adhesive strips in place. These skin closures may need to stay in place for 2 weeks or longer. If adhesive strip edges start to loosen and curl up, you may trim the loose edges. Do not remove adhesive strips completely unless your health care provider tells you to do that.  Check your port insertion site every day for signs of infection. Check for: ? Redness, swelling, or pain. ? Fluid or blood. ? Warmth. ? Pus or a bad smell.      Activity  Return to your normal activities as told by your health care provider. Ask your health care provider what activities are safe for you.  Do not  lift anything that is heavier than 10 lb (4.5 kg), or the limit that you are told, until your health care provider says that it is safe. General instructions  Take over-the-counter and prescription medicines only as told by your health care provider.  Do not take baths, swim, or use a hot tub until your health care provider approves. Ask your health care provider if you may take showers. You may only be allowed to take sponge baths.  Do not drive for 24 hours if you were given a sedative during your procedure.  Wear a medical alert bracelet in case of an emergency. This will tell any health care providers that you have a port.  Keep all follow-up visits as told by your health care provider. This is important. Contact a health care provider if:  You cannot flush your port with saline as directed, or you cannot draw blood from the port.  You have a fever or chills.  You have redness, swelling, or pain around your port insertion site.  You have fluid or blood coming from your port insertion site.  Your port insertion site feels warm to the touch.  You have pus or a bad smell coming from the port insertion site. Get help right away if:  You have chest pain or shortness of breath.  You have bleeding from your port that you cannot control. Summary  Take care of the port as told by your   health care provider. Keep the manufacturer's information card with you at all times.  Change your dressing as told by your health care provider.  Contact a health care provider if you have a fever or chills or if you have redness, swelling, or pain around your port insertion site.  Keep all follow-up visits as told by your health care provider. This information is not intended to replace advice given to you by your health care provider. Make sure you discuss any questions you have with your health care provider. Document Revised: 06/30/2018 Document Reviewed: 06/30/2018 Elsevier Patient Education   2021 Elsevier Inc.  

## 2021-02-02 NOTE — Telephone Encounter (Signed)
Scheduled appts per /218 sch msg. Gave pt a print out of AVS.  

## 2021-02-02 NOTE — Progress Notes (Signed)
[place] OFFICE PROGRESS NOTE  Patient Care Team: [name], [name], MD as PCP - General (Family Medicine)  ASSESSMENT & PLAN:  Endometrial cancer [facility]) She has symptoms of abdominal carcinomatosis with postprandial vomiting and pain We will proceed with chemotherapy as scheduled Due to her significant abdominal symptoms, I am concerned about risk of severe dehydration She is in agreement to come in for aggressive IV fluid support I will see her next week for further follow-up  Cancer associated pain She has persistent abdominal pain I recommend her to take morphine sulfate on a regular basis I will reassess pain control next week  Other constipation She has history of constipation but denies recent constipation We discussed the importance of aggressive laxative therapy  Nausea and vomiting She has regular nausea vomiting especially after meals I recommend her to take antiemetics before meals Clinically, she has lost some weight and I suspect she has dehydration I recommend IV fluid support the next few days  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   All questions were answered. The patient knows to call the clinic with any problems, questions or concerns. The total time spent in the appointment was 40 minutes encounter with patients including review of chart and various tests results, discussions about plan of care and coordination of care plan   [name], MD 02/02/2021 1:06 PM  INTERVAL HISTORY: Please see below for problem oriented charting. She returns with family for further follow-up Since last time I saw her, she started to have vaginal bleeding She have regular nausea and frequent postprandial vomiting usually after 30 minutes of eating She is barely able to keep her food down this morning She  is averaging only about 2-3 bottles of water per day She felt weak overall She has regular bowel movement with aggressive laxative therapy She complains of uncontrolled pain She only take 2 morphine sulfate per day  SUMMARY OF ONCOLOGIC HISTORY: Oncology History Overview Note  ER/PR neg, MSI stable   Endometrial cancer ([place])  06/15/2018 Surgery   Surgeon: Donaciano Eva     Operation: Robotic-assisted laparoscopic total hysterectomy with bilateral salpingoophorectomy, SLN biopsy     Operative Findings:  : 6cm uterus, normal tubes and ovaries, omentum adherent to the anterior abdominal wall and uterine fundus. No suspicious nodes.   06/15/2018 Pathology Results   Procedure: Total hysterectomy with bilateral salpingo-oophorectomy. Bilateral obturator sentinel lymph node biopsies. Histologic type: Endometrioid adenocarcinoma. Histologic Grade: FIGO grade 1. Myometrial invasion: Depth of invasion: 15 mm Myometrial thickness: 20 mm Uterine Serosa Involvement: Not identified. Cervical stromal involvement: Not identified. Extent of involvement of other organs: Uninvolved. Lymphovascular invasion: Present. Regional Lymph Nodes: Examined: 2 Sentinel 0 Non-sentinel 2 Total Lymph nodes with metastasis: 0 Isolated tumor cells (< 0.2 mm): 2 Micrometastasis: (> 0.2 mm and < 2.0 mm): 0 Macrometastasis: (> 2.0 mm): 0 Extracapsular extension: N/A. Tumor block for ancillary studies: 4Q-S. MMR / MSI testing: Will be ordered. Pathologic Stage Classification (pTNM, AJCC 8th edition): pT1b, pN0(i+)   07/20/2018 Imaging   1. Surgical changes from recent hysterectomy. No findings suspicious for residual tumor or adenopathy in the pelvis. 2. No CT findings to suggest omental, peritoneal surface or solid organ metastatic disease in the abdomen. 3. Changes consistent with known sarcoidosis in the chest with bulky calcified mediastinal and hilar lymph nodes and interstitial lung disease.  I do not  see any definite CT findings to suggest metastatic disease involving the chest.   08/19/2018 - 09/17/2018 Radiation Therapy   08/19/18 - 09/17/18: Vaginal Cuff, 3 cm cylinder with treatment length of 3 cm / 30 Gy delivered in 5 fractions of 6 Gy, Brachytherapy / HDR, Iridium-192   05/2005: Left Chest Wall keloid   04/2004 - 05/2004: Anus, pelvis /inguinal ( Dr. Tammi Klippel)   01/19/2020 Imaging   1. No definite CT findings of the abdomen or pelvis to explain pain. 2. The sigmoid colon and rectum are decompressed although slightly thickened appearing, suggestive of nonspecific infectious, inflammatory, or ischemic colitis. Correlate for referable clinical symptoms. 3. There is trace, nonspecific free fluid in the low pelvis, which may be reactive, although would be difficult to distinguish from postoperative and post treatment soft tissue change in the pelvis given history of hysterectomy and rectal cancer. 4. Unchanged prominent retroperitoneal and iliac lymph nodes. No new findings suspicious for metastatic disease in the abdomen or pelvis. 5. Bibasilar pulmonary fibrosis and mediastinal and hilar lymphadenopathy, better evaluated by prior CT chest and consistent with patient diagnosis of sarcoidosis.       04/18/2020 Imaging   Venous Doppler Left:  - No evidence of deep vein thrombosis seen in the left lower extremity, from the common femoral through the popliteal veins.  - No evidence of superficial venous thrombosis in the left lower extremity.  - There is no evidence of venous reflux seen in the left lower extremity.  - Rouleax flow noted in the superficial system.    05/18/2020 PET scan   1. Complex scan with a combination of hypermetabolic granulomatous disease (sarcoidosis) as well as hypermetabolic metastatic endometrial carcinoma. 2. Hypermetabolic mediastinal hilar lymph nodes and pulmonary nodularity with bronchovascular thickening is consistent with sarcoidosis. 3. Solitary hypermetabolic  nodule in the medial LEFT lower lobe, new from prior with intense metabolic activity, is concerning for a metastatic deposit to the LEFT lower lobe. 4. Ill-defined hypermetabolic soft tissue along the LEFT operator space is most consistent with local recurrence of endometrial carcinoma. There is entrapment of the LEFT ureter by this hypermetabolic soft tissue with mild hydroureter and hydronephrosis on the LEFT. 5. Additionally there appears to be direct extension from the hypermetabolic left operator space soft tissue to the sigmoid colon with there is a well-defined hypermetabolic lesion in the colon. 6. More distant hypermetabolic peritoneal metastasis along the ventral peritoneal surface of the RIGHT abdomen beneath RIGHT rectus abdominus muscle. 7. Hypermetabolic lymph nodes in the porta hepatis are favored related to sarcoidosis.   05/19/2020 Cancer Staging   Staging form: Corpus Uteri - Carcinoma and Carcinosarcoma, AJCC 8th Edition - Clinical stage from 05/19/2020: Stage IVB (rcT1, cN2a, cM1) - Signed by [name], MD on 05/19/2020   05/26/2020 Procedure   Successful placement of a right internal jugular approach power injectable Port-A-Cath. The catheter is ready for immediate use   05/29/2020 - 09/27/2020 Chemotherapy   The patient had carboplatin and taxol for chemotherapy treatment.     07/31/2020 Imaging   1. Asymmetric soft tissue mass extending from the left vaginal cuff has decreased in size in the interval. No residual peritoneal nodularity. Pulmonary metastases are stable with exception of a minimally improved left lower lobe nodule. 2. Stable soft tissue nodule associated with the right rectus abdominus muscle. 3. Interval resolution of left hydronephrosis. 4. Calcified mediastinal and hilar lymph nodes, together with pulmonary parenchymal fibrosis, findings which are in keeping with patient's known  diagnosis of sarcoid. 5. Aortic atherosclerosis (ICD10-I70.0). Coronary artery  calcification.   10/27/2020 Imaging   1. Essentially stable appearance of the soft tissue density nodule posteriorly in the left rectus abdominus muscle, currently 1.8 by 1.2 cm, previously 11 mm. 2. Stable calcified adenopathy in the mediastinum and hila, compatible with prior granulomatous disease. 3. Stable small right lower lobe pulmonary nodules. 4. Stable soft tissue density prominence along the left vaginal cuff extending up towards the adnexa, probably therapy related. 5. Other imaging findings of potential clinical significance: Coronary atherosclerosis. Small type 1 hiatal hernia. Stable subpleural reticulation favoring the lung bases, possibly from mild fibrosis. 6. Aortic atherosclerosis.   01/19/2021 Imaging   1. Status post hysterectomy. Interval enlargement of soft tissue adjacent to the left aspect of the vaginal cuff and bladder, measuring approximately 4.3 x 2.9 cm in axial section, previously previously previously 3.7 x 1.8 cm when measured similarly. Extensive soft tissue thickening and stranding throughout the low pelvis. 2. Interval enlargement of a metastatic nodule within the right rectus abdominus. New small peritoneal nodule anterior to the sigmoid colon. 3. Interval enlargement of retroperitoneal retroperitoneal lymph nodes. 4. Multiple small bilateral pulmonary nodules, several of which are new and enlarged. 5. Constellation of findings above is consistent with worsened metastatic disease. 6. New left hydronephrosis and hydroureter, the distal left ureter obstructed by soft tissue near the left aspect of the urinary bladder and vaginal cuff. 7. Unchanged enlarged, partially calcified mediastinal and hilar lymph nodes,, in keeping with prior granulomatous infection or sarcoidosis. 8. Mild underlying pulmonary fibrosis, in a UIP pattern. 9. Coronary artery disease.   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     02/02/2021 -  Chemotherapy    Patient is on Treatment Plan: OVARIAN CARBOPLATIN / DOCETAXEL Q21D        REVIEW OF SYSTEMS:   Constitutional: Denies fevers, chills or abnormal weight loss Eyes: Denies blurriness of vision Ears, nose, mouth, throat, and face: Denies mucositis or sore throat Respiratory: Denies cough, dyspnea or wheezes Cardiovascular: Denies palpitation, chest discomfort or lower extremity swelling Skin: Denies abnormal skin rashes Lymphatics: Denies new lymphadenopathy or easy bruising Neurological:Denies numbness, tingling or new weaknesses Behavioral/Psych: Mood is stable, no new changes  All other systems were reviewed with the patient and are negative.  I have reviewed the past medical history, past surgical history, social history and family history with the patient and they are unchanged from previous note.  ALLERGIES:  is allergic to shellfish allergy and penicillins.  MEDICATIONS:  Current Outpatient Medications  Medication Sig Dispense Refill  . albuterol (VENTOLIN HFA) 108 (90 Base) MCG/ACT inhaler INHALE 2 PUFFS INTO THE LUNGS EVERY 6 HOURS AS NEEDED FOR WHEEZING OR SHORTNESS OF BREATH (NEED MD APPOINTMENT) 3 each 0  . bisoprolol (ZEBETA) 10 MG tablet Take  1 tablet (10 mg total) by mouth in the morning and at bedtime. 180 tablet 0  .  budesonide-formoterol (SYMBICORT) 80-4.5 MCG/ACT inhaler Inhale 2 puffs into the lungs 2 (two) times daily. 3 Inhaler 3  . desonide (DESOWEN) 0.05 % lotion Apply topically 2 (two) times daily. Continue following with dermatologist 59 mL 0  . dexamethasone (DECADRON) 4 MG tablet Take 2 tablets (8 mg total) by mouth 2 (two) times daily. Take decadron  Twice daily the day before taxotere then daily x 3 days after carboplatin  chemo 30 tablet 1  . dextromethorphan (DELSYM) 30 MG/5ML liquid Take by mouth as needed for cough.     . hydrochlorothiazide (HYDRODIURIL) 25 MG tablet Take 25 mg by mouth daily.    . hydrocortisone 2.5 % cream Apply 1 application topically 2 (two) times daily.    Marland Kitchen loratadine (CLARITIN) 10 MG tablet Take 10 mg by mouth daily as needed for allergies.    Marland Kitchen morphine (MSIR) 15 MG tablet Take 1 tablet (15 mg total) by mouth every 4 (four) hours as needed for severe pain. 90 tablet 0  . ondansetron (ZOFRAN) 8 MG tablet Take 1 tablet (8 mg total) by mouth every 8 (eight) hours as needed for nausea. 30 tablet 3  . pantoprazole (PROTONIX) 40 MG tablet Take 1 tablet (40 mg total) by mouth daily. Patient needs appt for further refills. 90 tablet 0  . polyethylene glycol (MIRALAX / GLYCOLAX) 17 g packet Take 17 g by mouth daily.    . predniSONE (DELTASONE) 10 MG tablet Take 0.5 tablets (5 mg total) by mouth daily with breakfast. Patient needs appt for further refills 90 tablet 0  . prochlorperazine (COMPAZINE) 10 MG tablet Take 1 tablet (10 mg total) by mouth every 6 (six) hours as needed for nausea or vomiting. 60 tablet 3  . RABEprazole (ACIPHEX) 20 MG tablet Take 1 tablet (20 mg total) by mouth daily before breakfast. 90 tablet 3  . spironolactone (ALDACTONE) 25 MG tablet Take 1 tablet (25 mg total) by mouth daily. 30 tablet 1  . vitamin C (ASCORBIC ACID) 500 MG tablet Take 500 mg by mouth daily.     No current facility-administered medications for this visit.   Facility-Administered  Medications Ordered in Other Visits  Medication Dose Route Frequency Provider Last Rate Last Admin  . 0.9 %  sodium chloride infusion   Intravenous Once Alvy Bimler, Darvell Monteforte, MD      . CARBOplatin (PARAPLATIN) 550 mg in sodium chloride 0.9 % 250 mL chemo infusion  550 mg Intravenous Once Alvy Bimler, Ahaan Zobrist, MD      . dexamethasone (DECADRON) 10 mg in sodium chloride 0.9 % 50 mL IVPB  10 mg Intravenous Once Alvy Bimler, Nell Schrack, MD      . diphenhydrAMINE (BENADRYL) injection 25 mg  25 mg Intravenous Once Alvy Bimler, Aubert Choyce, MD      . DOCEtaxel (TAXOTERE) 130 mg in sodium chloride 0.9 % 250 mL chemo infusion  60 mg/m2 (Treatment Plan Recorded) Intravenous Once Alvy Bimler, Delisa Finck, MD      . famotidine (PEPCID) IVPB 20 mg premix  20 mg Intravenous Once Alvy Bimler, Ajaya Crutchfield, MD      . fosaprepitant (EMEND) 150 mg in sodium chloride 0.9 % 145 mL IVPB  150 mg Intravenous Once Michelle Vanhise, MD      . heparin lock flush 100 unit/mL  500 Units Intracatheter Once PRN Alvy Bimler, Porschia Willbanks, MD      . palonosetron (ALOXI) injection 0.25 mg  0.25 mg Intravenous Once [place],  Jedidiah Demartini, MD      . sodium chloride flush (NS) 0.9 % injection 10 mL  10 mL Intracatheter PRN Alvy Bimler, Magdelena Kinsella, MD        PHYSICAL EXAMINATION: ECOG PERFORMANCE STATUS: 2 - Symptomatic, <50% confined to bed  Vitals:   02/02/21 1207  BP: (!) 168/82  Pulse: 70  Resp: 18  Temp: 98.5 F (36.9 C)  SpO2: 98%   Filed Weights   02/02/21 1207  Weight: 217 lb (98.4 kg)    GENERAL:alert, no distress and comfortable HEART: noted moderate lower extremity edema NEURO: alert & oriented x 3 with fluent speech, no focal motor/sensory deficits  LABORATORY DATA:  I have reviewed the data as listed    Component Value Date/Time   NA 138 02/02/2021 1152   K 3.8 02/02/2021 1152   CL 102 02/02/2021 1152   CO2 24 02/02/2021 1152   GLUCOSE 214 (H) 02/02/2021 1152   BUN 20 02/02/2021 1152   CREATININE 1.07 (H) 02/02/2021 1152   CALCIUM 9.3 02/02/2021 1152   PROT 8.2 (H) 02/02/2021 1152   ALBUMIN 4.0  02/02/2021 1152   AST 13 (L) 02/02/2021 1152   ALT 14 02/02/2021 1152   ALKPHOS 89 02/02/2021 1152   BILITOT 0.5 02/02/2021 1152   GFRNONAA 59 (L) 02/02/2021 1152   GFRAA >60 09/08/2020 0819    No results found for: SPEP, UPEP  Lab Results  Component Value Date   WBC 7.0 02/02/2021   NEUTROABS 5.9 02/02/2021   HGB 10.1 (L) 02/02/2021   HCT 28.5 (L) 02/02/2021   MCV 92.5 02/02/2021   PLT 185 02/02/2021      Chemistry      Component Value Date/Time   NA 138 02/02/2021 1152   K 3.8 02/02/2021 1152   CL 102 02/02/2021 1152   CO2 24 02/02/2021 1152   BUN 20 02/02/2021 1152   CREATININE 1.07 (H) 02/02/2021 1152      Component Value Date/Time   CALCIUM 9.3 02/02/2021 1152   ALKPHOS 89 02/02/2021 1152   AST 13 (L) 02/02/2021 1152   ALT 14 02/02/2021 1152   BILITOT 0.5 02/02/2021 1152       RADIOGRAPHIC STUDIES: I have personally reviewed the radiological images as listed and agreed with the findings in the report. CT CHEST ABDOMEN PELVIS W CONTRAST  Result Date: 01/19/2021 CLINICAL DATA:  Cervical cancer restaging, last chemotherapy November 2021 EXAM: CT CHEST, ABDOMEN, AND PELVIS WITH CONTRAST TECHNIQUE: Multidetector CT imaging of the chest, abdomen and pelvis was performed following the standard protocol during bolus administration of intravenous contrast. CONTRAST:  169m OMNIPAQUE IOHEXOL 300 MG/ML SOLN, additional oral enteric contrast COMPARISON:  10/27/2020 FINDINGS: CT CHEST FINDINGS Cardiovascular: Right chest port catheter. Aortic atherosclerosis. Normal heart size. Left coronary artery calcifications. No pericardial effusion. Mediastinum/Nodes: Numerous unchanged enlarged, partially calcified mediastinal and hilar lymph nodes, largest index subcarinal lymph node measuring 4.1 x 2.1 cm (series 2, image 28). Thyroid gland, trachea, and esophagus demonstrate no significant findings. Lungs/Pleura: Redemonstrated mild pulmonary fibrosis in a pattern with apical to basal  gradient featuring irregular peripheral interstitial opacity, septal thickening, subpleural bronchiolectasis, and probable honeycombing. There are multiple small bilateral pulmonary nodules, several of which appear to have increased in size compared to prior examination, for example a nodule of the peripheral right lower lobe measuring 0.9 cm, previously 0.5 cm (series 7, image 86). There are occasional new nodules, for example a new 7 mm nodule in the lingula (series 7, image 86). No pleural effusion  or pneumothorax. Musculoskeletal: No chest wall mass or suspicious bone lesions identified. CT ABDOMEN PELVIS FINDINGS Hepatobiliary: No focal liver abnormality is seen. Status post cholecystectomy. No biliary dilatation. Pancreas: Unremarkable. No pancreatic ductal dilatation or surrounding inflammatory changes. Spleen: Normal in size without significant abnormality. Adrenals/Urinary Tract: Adrenal glands are unremarkable. There is new left hydronephrosis and hydroureter, the distal left ureter obstructed by soft tissue near the left aspect of the urinary bladder and vaginal cuff (series 2, image 101). Bladder is unremarkable. Stomach/Bowel: Stomach is within normal limits. Appendix appears normal. No evidence of bowel wall thickening, distention, or inflammatory changes. Vascular/Lymphatic: Aortic atherosclerosis. Numerous enlarged retroperitoneal, left ovarian, and left pelvic sidewall lymph nodes. Left retroperitoneal nodes are increased in size compared to prior examination, largest node measuring 2.0 x 1.4 cm, previously 0.9 x 0.8 cm (series 2, image 69). Reproductive: Status post hysterectomy. Interval enlargement of soft tissue adjacent to the left aspect of the vaginal cuff and bladder, measuring approximately 4.3 x 2.9 cm in axial section, previously previously 3.7 x 1.8 cm when measured similarly (series 2, image 101). Extensive soft tissue thickening and stranding throughout the low pelvis. Other: Interval  enlargement of a metastatic nodule within the right rectus abdominus, measuring 2.0 x 2.0 cm, previously 1.6 x 1.5 cm (series 2, image 79). New small peritoneal nodule anterior to the sigmoid colon measuring 0.6 cm (series 2, image 92). No abdominopelvic ascites. Musculoskeletal: No acute or significant osseous findings. IMPRESSION: 1. Status post hysterectomy. Interval enlargement of soft tissue adjacent to the left aspect of the vaginal cuff and bladder, measuring approximately 4.3 x 2.9 cm in axial section, previously previously previously 3.7 x 1.8 cm when measured similarly. Extensive soft tissue thickening and stranding throughout the low pelvis. 2. Interval enlargement of a metastatic nodule within the right rectus abdominus. New small peritoneal nodule anterior to the sigmoid colon. 3. Interval enlargement of retroperitoneal retroperitoneal lymph nodes. 4. Multiple small bilateral pulmonary nodules, several of which are new and enlarged. 5. Constellation of findings above is consistent with worsened metastatic disease. 6. New left hydronephrosis and hydroureter, the distal left ureter obstructed by soft tissue near the left aspect of the urinary bladder and vaginal cuff. 7. Unchanged enlarged, partially calcified mediastinal and hilar lymph nodes,, in keeping with prior granulomatous infection or sarcoidosis. 8. Mild underlying pulmonary fibrosis, in a UIP pattern. 9. Coronary artery disease. Aortic Atherosclerosis (ICD10-I70.0). Electronically Signed   By: Eddie Candle M.D.   On: 01/19/2021 12:50

## 2021-02-03 ENCOUNTER — Inpatient Hospital Stay: Payer: Medicare HMO

## 2021-02-03 VITALS — BP 146/69 | HR 58 | Temp 97.2°F | Resp 17 | Ht 65.5 in

## 2021-02-03 DIAGNOSIS — R112 Nausea with vomiting, unspecified: Secondary | ICD-10-CM | POA: Diagnosis not present

## 2021-02-03 DIAGNOSIS — Z5111 Encounter for antineoplastic chemotherapy: Secondary | ICD-10-CM | POA: Diagnosis not present

## 2021-02-03 DIAGNOSIS — C541 Malignant neoplasm of endometrium: Secondary | ICD-10-CM

## 2021-02-03 MED ORDER — SODIUM CHLORIDE 0.9% FLUSH
10.0000 mL | Freq: Once | INTRAVENOUS | Status: AC | PRN
  Administered 2021-02-03: 10 mL
  Filled 2021-02-03: qty 10

## 2021-02-03 MED ORDER — SODIUM CHLORIDE 0.9 % IV SOLN
Freq: Once | INTRAVENOUS | Status: AC
  Filled 2021-02-03: qty 250

## 2021-02-03 MED ORDER — HEPARIN SOD (PORK) LOCK FLUSH 100 UNIT/ML IV SOLN
500.0000 [IU] | Freq: Once | INTRAVENOUS | Status: AC
  Administered 2021-02-03: 500 [IU]
  Filled 2021-02-03: qty 5

## 2021-02-03 NOTE — Patient Instructions (Signed)
Rehydration, Adult Rehydration is the replacement of body fluids, salts, and minerals (electrolytes) that are lost during dehydration. Dehydration is when there is not enough water or other fluids in the body. This happens when you lose more fluids than you take in. Common causes of dehydration include:  Not drinking enough fluids. This can occur when you are ill or doing activities that require a lot of energy, especially in hot weather.  Conditions that cause loss of water or other fluids, such as diarrhea, vomiting, sweating, or urinating a lot.  Other illnesses, such as fever or infection.  Certain medicines, such as those that remove excess fluid from the body (diuretics). Symptoms of mild or moderate dehydration may include thirst, dry lips and mouth, and dizziness. Symptoms of severe dehydration may include increased heart rate, confusion, fainting, and not urinating. For severe dehydration, you may need to get fluids through an IV at the hospital. For mild or moderate dehydration, you can usually rehydrate at home by drinking certain fluids as told by your health care provider. What are the risks? Generally, rehydration is safe. However, taking in too much fluid (overhydration) can be a problem. This is rare. Overhydration can cause an electrolyte imbalance, kidney failure, or a decrease in salt (sodium) levels in the body. Supplies needed You will need an oral rehydration solution (ORS) if your health care provider tells you to use one. This is a drink to treat dehydration. It can be found in pharmacies and retail stores. How to rehydrate Fluids Follow instructions from your health care provider for rehydration. The kind of fluid and the amount you should drink depend on your condition. In general, you should choose drinks that you prefer.  If told by your health care provider, drink an ORS. ? Make an ORS by following instructions on the package. ? Start by drinking small amounts,  about  cup (120 mL) every 5-10 minutes. ? Slowly increase how much you drink until you have taken the amount recommended by your health care provider.  Drink enough clear fluids to keep your urine pale yellow. If you were told to drink an ORS, finish it first, then start slowly drinking other clear fluids. Drink fluids such as: ? Water. This includes sparkling water and flavored water. Drinking only water can lead to having too little sodium in your body (hyponatremia). Follow the advice of your health care provider. ? Water from ice chips you suck on. ? Fruit juice with water you add to it (diluted). ? Sports drinks. ? Hot or cold herbal teas. ? Broth-based soups. ? Milk or milk products. Food Follow instructions from your health care provider about what to eat while you rehydrate. Your health care provider may recommend that you slowly begin eating regular foods in small amounts.  Eat foods that contain a healthy balance of electrolytes, such as bananas, oranges, potatoes, tomatoes, and spinach.  Avoid foods that are greasy or contain a lot of sugar. In some cases, you may get nutrition through a feeding tube that is passed through your nose and into your stomach (nasogastric tube, or NG tube). This may be done if you have uncontrolled vomiting or diarrhea.   Beverages to avoid Certain beverages may make dehydration worse. While you rehydrate, avoid drinking alcohol.   How to tell if you are recovering from dehydration You may be recovering from dehydration if:  You are urinating more often than before you started rehydrating.  Your urine is pale yellow.  Your energy level   improves.  You vomit less frequently.  You have diarrhea less frequently.  Your appetite improves or returns to normal.  You feel less dizzy or less light-headed.  Your skin tone and color start to look more normal. Follow these instructions at home:  Take over-the-counter and prescription medicines only  as told by your health care provider.  Do not take sodium tablets. Doing this can lead to having too much sodium in your body (hypernatremia). Contact a health care provider if:  You continue to have symptoms of mild or moderate dehydration, such as: ? Thirst. ? Dry lips. ? Slightly dry mouth. ? Dizziness. ? Dark urine or less urine than normal. ? Muscle cramps.  You continue to vomit or have diarrhea. Get help right away if you:  Have symptoms of dehydration that get worse.  Have a fever.  Have a severe headache.  Have been vomiting and the following happens: ? Your vomiting gets worse or does not go away. ? Your vomit includes blood or green matter (bile). ? You cannot eat or drink without vomiting.  Have problems with urination or bowel movements, such as: ? Diarrhea that gets worse or does not go away. ? Blood in your stool (feces). This may cause stool to look black and tarry. ? Not urinating, or urinating only a small amount of very dark urine, within 6-8 hours.  Have trouble breathing.  Have symptoms that get worse with treatment. These symptoms may represent a serious problem that is an emergency. Do not wait to see if the symptoms will go away. Get medical help right away. Call your local emergency services (911 in the [place] Do not drive yourself to the hospital. Summary  Rehydration is the replacement of body fluids and minerals (electrolytes) that are lost during dehydration.  Follow instructions from your health care provider for rehydration. The kind of fluid and amount you should drink depend on your condition.  Slowly increase how much you drink until you have taken the amount recommended by your health care provider.  Contact your health care provider if you continue to show signs of mild or moderate dehydration. This information is not intended to replace advice given to you by your health care provider. Make sure you discuss any questions you have with  your health care provider. Document Revised: 02/02/2020 Document Reviewed: 12/13/2019 Elsevier Patient Education  2021 [employer] Inc.  

## 2021-02-03 NOTE — Progress Notes (Signed)
Per Pt and schedule comment, fluids to run over 1.5 hours. Rate 750cc/hr.

## 2021-02-05 ENCOUNTER — Inpatient Hospital Stay: Payer: Medicare HMO

## 2021-02-05 ENCOUNTER — Other Ambulatory Visit: Payer: Self-pay

## 2021-02-05 VITALS — BP 176/86 | HR 50 | Temp 97.9°F | Resp 16

## 2021-02-05 DIAGNOSIS — R112 Nausea with vomiting, unspecified: Secondary | ICD-10-CM | POA: Diagnosis not present

## 2021-02-05 DIAGNOSIS — Z5111 Encounter for antineoplastic chemotherapy: Secondary | ICD-10-CM | POA: Diagnosis not present

## 2021-02-05 DIAGNOSIS — C541 Malignant neoplasm of endometrium: Secondary | ICD-10-CM | POA: Diagnosis not present

## 2021-02-05 MED ORDER — SODIUM CHLORIDE 0.9% FLUSH
10.0000 mL | Freq: Once | INTRAVENOUS | Status: AC | PRN
  Administered 2021-02-05: 10 mL
  Filled 2021-02-05: qty 10

## 2021-02-05 MED ORDER — SODIUM CHLORIDE 0.9 % IV SOLN
Freq: Once | INTRAVENOUS | Status: AC
  Filled 2021-02-05: qty 250

## 2021-02-05 MED ORDER — HEPARIN SOD (PORK) LOCK FLUSH 100 UNIT/ML IV SOLN
500.0000 [IU] | Freq: Once | INTRAVENOUS | Status: AC | PRN
  Administered 2021-02-05: 500 [IU]
  Filled 2021-02-05: qty 5

## 2021-02-05 NOTE — Patient Instructions (Signed)
Rehydration, Adult Rehydration is the replacement of body fluids, salts, and minerals (electrolytes) that are lost during dehydration. Dehydration is when there is not enough water or other fluids in the body. This happens when you lose more fluids than you take in. Common causes of dehydration include:  Not drinking enough fluids. This can occur when you are ill or doing activities that require a lot of energy, especially in hot weather.  Conditions that cause loss of water or other fluids, such as diarrhea, vomiting, sweating, or urinating a lot.  Other illnesses, such as fever or infection.  Certain medicines, such as those that remove excess fluid from the body (diuretics). Symptoms of mild or moderate dehydration may include thirst, dry lips and mouth, and dizziness. Symptoms of severe dehydration may include increased heart rate, confusion, fainting, and not urinating. For severe dehydration, you may need to get fluids through an IV at the hospital. For mild or moderate dehydration, you can usually rehydrate at home by drinking certain fluids as told by your health care provider. What are the risks? Generally, rehydration is safe. However, taking in too much fluid (overhydration) can be a problem. This is rare. Overhydration can cause an electrolyte imbalance, kidney failure, or a decrease in salt (sodium) levels in the body. Supplies needed You will need an oral rehydration solution (ORS) if your health care provider tells you to use one. This is a drink to treat dehydration. It can be found in pharmacies and retail stores. How to rehydrate Fluids Follow instructions from your health care provider for rehydration. The kind of fluid and the amount you should drink depend on your condition. In general, you should choose drinks that you prefer.  If told by your health care provider, drink an ORS. ? Make an ORS by following instructions on the package. ? Start by drinking small amounts,  about  cup (120 mL) every 5-10 minutes. ? Slowly increase how much you drink until you have taken the amount recommended by your health care provider.  Drink enough clear fluids to keep your urine pale yellow. If you were told to drink an ORS, finish it first, then start slowly drinking other clear fluids. Drink fluids such as: ? Water. This includes sparkling water and flavored water. Drinking only water can lead to having too little sodium in your body (hyponatremia). Follow the advice of your health care provider. ? Water from ice chips you suck on. ? Fruit juice with water you add to it (diluted). ? Sports drinks. ? Hot or cold herbal teas. ? Broth-based soups. ? Milk or milk products. Food Follow instructions from your health care provider about what to eat while you rehydrate. Your health care provider may recommend that you slowly begin eating regular foods in small amounts.  Eat foods that contain a healthy balance of electrolytes, such as bananas, oranges, potatoes, tomatoes, and spinach.  Avoid foods that are greasy or contain a lot of sugar. In some cases, you may get nutrition through a feeding tube that is passed through your nose and into your stomach (nasogastric tube, or NG tube). This may be done if you have uncontrolled vomiting or diarrhea.   Beverages to avoid Certain beverages may make dehydration worse. While you rehydrate, avoid drinking alcohol.   How to tell if you are recovering from dehydration You may be recovering from dehydration if:  You are urinating more often than before you started rehydrating.  Your urine is pale yellow.  Your energy level   improves.  You vomit less frequently.  You have diarrhea less frequently.  Your appetite improves or returns to normal.  You feel less dizzy or less light-headed.  Your skin tone and color start to look more normal. Follow these instructions at home:  Take over-the-counter and prescription medicines only  as told by your health care provider.  Do not take sodium tablets. Doing this can lead to having too much sodium in your body (hypernatremia). Contact a health care provider if:  You continue to have symptoms of mild or moderate dehydration, such as: ? Thirst. ? Dry lips. ? Slightly dry mouth. ? Dizziness. ? Dark urine or less urine than normal. ? Muscle cramps.  You continue to vomit or have diarrhea. Get help right away if you:  Have symptoms of dehydration that get worse.  Have a fever.  Have a severe headache.  Have been vomiting and the following happens: ? Your vomiting gets worse or does not go away. ? Your vomit includes blood or green matter (bile). ? You cannot eat or drink without vomiting.  Have problems with urination or bowel movements, such as: ? Diarrhea that gets worse or does not go away. ? Blood in your stool (feces). This may cause stool to look black and tarry. ? Not urinating, or urinating only a small amount of very dark urine, within 6-8 hours.  Have trouble breathing.  Have symptoms that get worse with treatment. These symptoms may represent a serious problem that is an emergency. Do not wait to see if the symptoms will go away. Get medical help right away. Call your local emergency services (911 in the [place] Do not drive yourself to the hospital. Summary  Rehydration is the replacement of body fluids and minerals (electrolytes) that are lost during dehydration.  Follow instructions from your health care provider for rehydration. The kind of fluid and amount you should drink depend on your condition.  Slowly increase how much you drink until you have taken the amount recommended by your health care provider.  Contact your health care provider if you continue to show signs of mild or moderate dehydration. This information is not intended to replace advice given to you by your health care provider. Make sure you discuss any questions you have with  your health care provider. Document Revised: 02/02/2020 Document Reviewed: 12/13/2019 Elsevier Patient Education  2021 [employer] Inc.  

## 2021-02-06 ENCOUNTER — Encounter: Payer: Self-pay | Admitting: Hematology and Oncology

## 2021-02-06 ENCOUNTER — Inpatient Hospital Stay (HOSPITAL_BASED_OUTPATIENT_CLINIC_OR_DEPARTMENT_OTHER): Payer: Medicare HMO | Admitting: Hematology and Oncology

## 2021-02-06 ENCOUNTER — Telehealth: Payer: Self-pay | Admitting: Hematology and Oncology

## 2021-02-06 ENCOUNTER — Other Ambulatory Visit: Payer: Self-pay

## 2021-02-06 ENCOUNTER — Inpatient Hospital Stay: Payer: Medicare HMO

## 2021-02-06 ENCOUNTER — Inpatient Hospital Stay (HOSPITAL_BASED_OUTPATIENT_CLINIC_OR_DEPARTMENT_OTHER): Payer: Medicare HMO | Admitting: Medical

## 2021-02-06 ENCOUNTER — Other Ambulatory Visit: Payer: Self-pay | Admitting: Hematology and Oncology

## 2021-02-06 VITALS — BP 169/69 | HR 56 | Temp 98.4°F | Resp 18

## 2021-02-06 DIAGNOSIS — C775 Secondary and unspecified malignant neoplasm of intrapelvic lymph nodes: Secondary | ICD-10-CM

## 2021-02-06 DIAGNOSIS — R112 Nausea with vomiting, unspecified: Secondary | ICD-10-CM

## 2021-02-06 DIAGNOSIS — G893 Neoplasm related pain (acute) (chronic): Secondary | ICD-10-CM

## 2021-02-06 DIAGNOSIS — C541 Malignant neoplasm of endometrium: Secondary | ICD-10-CM

## 2021-02-06 DIAGNOSIS — E860 Dehydration: Secondary | ICD-10-CM | POA: Diagnosis not present

## 2021-02-06 DIAGNOSIS — K5909 Other constipation: Secondary | ICD-10-CM | POA: Diagnosis not present

## 2021-02-06 DIAGNOSIS — C210 Malignant neoplasm of anus, unspecified: Secondary | ICD-10-CM

## 2021-02-06 DIAGNOSIS — N879 Dysplasia of cervix uteri, unspecified: Secondary | ICD-10-CM

## 2021-02-06 DIAGNOSIS — C7802 Secondary malignant neoplasm of left lung: Secondary | ICD-10-CM

## 2021-02-06 DIAGNOSIS — Z5111 Encounter for antineoplastic chemotherapy: Secondary | ICD-10-CM | POA: Diagnosis not present

## 2021-02-06 MED ORDER — SODIUM CHLORIDE 0.9 % IV SOLN
Freq: Once | INTRAVENOUS | Status: AC
  Filled 2021-02-06: qty 250

## 2021-02-06 MED ORDER — LORAZEPAM 1 MG PO TABS
0.5000 mg | ORAL_TABLET | Freq: Once | ORAL | Status: AC
  Administered 2021-02-06: 0.5 mg via SUBLINGUAL

## 2021-02-06 MED ORDER — ONDANSETRON HCL 4 MG/2ML IJ SOLN
8.0000 mg | Freq: Once | INTRAMUSCULAR | Status: AC
  Administered 2021-02-06: 8 mg via INTRAVENOUS

## 2021-02-06 MED ORDER — LORAZEPAM 2 MG/ML IJ SOLN
INTRAMUSCULAR | Status: AC
  Filled 2021-02-06: qty 1

## 2021-02-06 MED ORDER — ONDANSETRON HCL 4 MG/2ML IJ SOLN
INTRAMUSCULAR | Status: AC
  Filled 2021-02-06: qty 4

## 2021-02-06 MED ORDER — HEPARIN SOD (PORK) LOCK FLUSH 100 UNIT/ML IV SOLN
500.0000 [IU] | Freq: Once | INTRAVENOUS | Status: AC | PRN
  Administered 2021-02-06: 500 [IU]
  Filled 2021-02-06: qty 5

## 2021-02-06 MED ORDER — SODIUM CHLORIDE 0.9% FLUSH
10.0000 mL | Freq: Once | INTRAVENOUS | Status: AC | PRN
  Administered 2021-02-06: 10 mL
  Filled 2021-02-06: qty 10

## 2021-02-06 NOTE — Progress Notes (Signed)
[facility] OFFICE PROGRESS NOTE  Patient Care Team: [name], [name], MD as PCP - General (Family Medicine)  ASSESSMENT & PLAN:  Endometrial cancer [facility]) She tolerated cycle 1 of treatment well Overall, her symptoms are not improved I will see her again prior to cycle 2  Dehydration She was clinically dehydrated when I saw her last week With additional daily IV fluids, her symptoms have resolved Recommend the patient to consider holding of diuretic therapy in the near future if she could not tolerate oral fluid  Cancer associated pain Her pain is well controlled She will continue to take pain medicine as needed  Nausea and vomiting She denies recent nausea or vomiting She has antiemetics to take as needed  Other constipation She denies recent constipation We discussed the importance of aggressive laxative therapy   No orders of the defined types were placed in this encounter.   All questions were answered. The patient knows to call the clinic with any problems, questions or concerns. The total time spent in the appointment was 20 minutes encounter with patients including review of chart and various tests results, discussions about plan of care and coordination of care plan   [name], MD 02/06/2021 10:26 AM  INTERVAL HISTORY: Please see below for problem oriented charting. She is seen in the infusion room She is feeling better She is able to eat No recent nausea vomiting Her pain is well controlled She denies recent constipation She felt so good that she does not think she needs to come back daily for IV fluids for the next few days   SUMMARY OF ONCOLOGIC HISTORY: Oncology History Overview Note  ER/PR neg, MSI stable   Endometrial cancer ([place])  06/15/2018 Surgery   Surgeon: Donaciano Eva     Operation: Robotic-assisted laparoscopic total hysterectomy with bilateral salpingoophorectomy, SLN biopsy     Operative Findings:  : 6cm uterus,  normal tubes and ovaries, omentum adherent to the anterior abdominal wall and uterine fundus. No suspicious nodes.   06/15/2018 Pathology Results   Procedure: Total hysterectomy with bilateral salpingo-oophorectomy. Bilateral obturator sentinel lymph node biopsies. Histologic type: Endometrioid adenocarcinoma. Histologic Grade: FIGO grade 1. Myometrial invasion: Depth of invasion: 15 mm Myometrial thickness: 20 mm Uterine Serosa Involvement: Not identified. Cervical stromal involvement: Not identified. Extent of involvement of other organs: Uninvolved. Lymphovascular invasion: Present. Regional Lymph Nodes: Examined: 2 Sentinel 0 Non-sentinel 2 Total Lymph nodes with metastasis: 0 Isolated tumor cells (< 0.2 mm): 2 Micrometastasis: (> 0.2 mm and < 2.0 mm): 0 Macrometastasis: (> 2.0 mm): 0 Extracapsular extension: N/A. Tumor block for ancillary studies: 4Q-S. MMR / MSI testing: Will be ordered. Pathologic Stage Classification (pTNM, AJCC 8th edition): pT1b, pN0(i+)   07/20/2018 Imaging   1. Surgical changes from recent hysterectomy. No findings suspicious for residual tumor or adenopathy in the pelvis. 2. No CT findings to suggest omental, peritoneal surface or solid organ metastatic disease in the abdomen. 3. Changes consistent with known sarcoidosis in the chest with bulky calcified mediastinal and hilar lymph nodes and interstitial lung disease. I do not see any definite CT findings to suggest metastatic disease involving the chest.   08/19/2018 - 09/17/2018 Radiation Therapy   08/19/18 - 09/17/18: Vaginal Cuff, 3 cm cylinder with treatment length of 3 cm / 30 Gy delivered in 5 fractions of 6 Gy, Brachytherapy / HDR, Iridium-192   05/2005: Left Chest Wall keloid   04/2004 - 05/2004: Anus, pelvis /inguinal ( Dr. Tammi Klippel)   01/19/2020  Imaging   1. No definite CT findings of the abdomen or pelvis to explain pain. 2. The sigmoid colon and rectum are decompressed although slightly thickened  appearing, suggestive of nonspecific infectious, inflammatory, or ischemic colitis. Correlate for referable clinical symptoms. 3. There is trace, nonspecific free fluid in the low pelvis, which may be reactive, although would be difficult to distinguish from postoperative and post treatment soft tissue change in the pelvis given history of hysterectomy and rectal cancer. 4. Unchanged prominent retroperitoneal and iliac lymph nodes. No new findings suspicious for metastatic disease in the abdomen or pelvis. 5. Bibasilar pulmonary fibrosis and mediastinal and hilar lymphadenopathy, better evaluated by prior CT chest and consistent with patient diagnosis of sarcoidosis.       04/18/2020 Imaging   Venous Doppler Left:  - No evidence of deep vein thrombosis seen in the left lower extremity, from the common femoral through the popliteal veins.  - No evidence of superficial venous thrombosis in the left lower extremity.  - There is no evidence of venous reflux seen in the left lower extremity.  - Rouleax flow noted in the superficial system.    05/18/2020 PET scan   1. Complex scan with a combination of hypermetabolic granulomatous disease (sarcoidosis) as well as hypermetabolic metastatic endometrial carcinoma. 2. Hypermetabolic mediastinal hilar lymph nodes and pulmonary nodularity with bronchovascular thickening is consistent with sarcoidosis. 3. Solitary hypermetabolic nodule in the medial LEFT lower lobe, new from prior with intense metabolic activity, is concerning for a metastatic deposit to the LEFT lower lobe. 4. Ill-defined hypermetabolic soft tissue along the LEFT operator space is most consistent with local recurrence of endometrial carcinoma. There is entrapment of the LEFT ureter by this hypermetabolic soft tissue with mild hydroureter and hydronephrosis on the LEFT. 5. Additionally there appears to be direct extension from the hypermetabolic left operator space soft tissue to the sigmoid  colon with there is a well-defined hypermetabolic lesion in the colon. 6. More distant hypermetabolic peritoneal metastasis along the ventral peritoneal surface of the RIGHT abdomen beneath RIGHT rectus abdominus muscle. 7. Hypermetabolic lymph nodes in the porta hepatis are favored related to sarcoidosis.   05/19/2020 Cancer Staging   Staging form: Corpus Uteri - Carcinoma and Carcinosarcoma, AJCC 8th Edition - Clinical stage from 05/19/2020: Stage IVB (rcT1, cN2a, cM1) - Signed by [name], MD on 05/19/2020   05/26/2020 Procedure   Successful placement of a right internal jugular approach power injectable Port-A-Cath. The catheter is ready for immediate use   05/29/2020 - 09/27/2020 Chemotherapy   The patient had carboplatin and taxol for chemotherapy treatment.     07/31/2020 Imaging   1. Asymmetric soft tissue mass extending from the left vaginal cuff has decreased in size in the interval. No residual peritoneal nodularity. Pulmonary metastases are stable with exception of a minimally improved left lower lobe nodule. 2. Stable soft tissue nodule associated with the right rectus abdominus muscle. 3. Interval resolution of left hydronephrosis. 4. Calcified mediastinal and hilar lymph nodes, together with pulmonary parenchymal fibrosis, findings which are in keeping with patient's known diagnosis of sarcoid. 5. Aortic atherosclerosis (ICD10-I70.0). Coronary artery calcification.   10/27/2020 Imaging   1. Essentially stable appearance of the soft tissue density nodule posteriorly in the left rectus abdominus muscle, currently 1.8 by 1.2 cm, previously 11 mm. 2. Stable calcified adenopathy in the mediastinum and hila, compatible with prior granulomatous disease. 3. Stable small right lower lobe pulmonary nodules. 4. Stable soft tissue density prominence along the left vaginal  cuff extending up towards the adnexa, probably therapy related. 5. Other imaging findings of potential clinical  significance: Coronary atherosclerosis. Small type 1 hiatal hernia. Stable subpleural reticulation favoring the lung bases, possibly from mild fibrosis. 6. Aortic atherosclerosis.   01/19/2021 Imaging   1. Status post hysterectomy. Interval enlargement of soft tissue adjacent to the left aspect of the vaginal cuff and bladder, measuring approximately 4.3 x 2.9 cm in axial section, previously previously previously 3.7 x 1.8 cm when measured similarly. Extensive soft tissue thickening and stranding throughout the low pelvis. 2. Interval enlargement of a metastatic nodule within the right rectus abdominus. New small peritoneal nodule anterior to the sigmoid colon. 3. Interval enlargement of retroperitoneal retroperitoneal lymph nodes. 4. Multiple small bilateral pulmonary nodules, several of which are new and enlarged. 5. Constellation of findings above is consistent with worsened metastatic disease. 6. New left hydronephrosis and hydroureter, the distal left ureter obstructed by soft tissue near the left aspect of the urinary bladder and vaginal cuff. 7. Unchanged enlarged, partially calcified mediastinal and hilar lymph nodes,, in keeping with prior granulomatous infection or sarcoidosis. 8. Mild underlying pulmonary fibrosis, in a UIP pattern. 9. Coronary artery disease.   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     02/02/2021 -  Chemotherapy    Patient is on Treatment Plan: OVARIAN CARBOPLATIN / DOCETAXEL Q21D        REVIEW OF SYSTEMS:   Constitutional: Denies fevers, chills or abnormal weight loss Eyes: Denies blurriness of vision Ears, nose, mouth, throat, and face: Denies mucositis or sore throat Respiratory: Denies cough, dyspnea or wheezes Cardiovascular: Denies palpitation, chest discomfort or lower extremity swelling Gastrointestinal:  Denies nausea, heartburn or change in bowel habits Skin: Denies abnormal skin rashes Lymphatics: Denies new lymphadenopathy or easy bruising Neurological:Denies numbness, tingling or new weaknesses Behavioral/Psych: Mood is stable, no new changes  All other systems were reviewed with the patient and are negative.  I have reviewed the past medical history, past surgical history, social history and family history with the patient and they are unchanged from previous note.  ALLERGIES:  is allergic to shellfish allergy and penicillins.  MEDICATIONS:  Current Outpatient Medications  Medication Sig Dispense Refill  . albuterol (VENTOLIN HFA) 108 (90 Base) MCG/ACT inhaler INHALE 2 PUFFS INTO THE LUNGS EVERY 6 HOURS AS NEEDED FOR WHEEZING OR SHORTNESS OF BREATH (NEED MD APPOINTMENT) 3 each 0  . bisoprolol (ZEBETA) 10 MG tablet Take 1 tablet (10 mg total) by mouth in the morning and at bedtime. 180 tablet 0  . budesonide-formoterol (SYMBICORT) 80-4.5 MCG/ACT inhaler Inhale 2 puffs into the lungs 2 (two) times daily. 3 Inhaler 3  . desonide (DESOWEN) 0.05 % lotion Apply topically 2 (two) times daily. Continue following with dermatologist 59 mL 0  . dexamethasone (DECADRON) 4 MG tablet  Take 2 tablets (8 mg total) by mouth 2 (two) times daily. Take decadron  Twice daily the day before taxotere then daily x 3 days after carboplatin  chemo 30 tablet 1  . dextromethorphan (DELSYM) 30  MG/5ML liquid Take by mouth as needed for cough.     . hydrochlorothiazide (HYDRODIURIL) 25 MG tablet Take 25 mg by mouth daily.    . hydrocortisone 2.5 % cream Apply 1 application topically 2 (two) times daily.    Marland Kitchen loratadine (CLARITIN) 10 MG tablet Take 10 mg by mouth daily as needed for allergies.    Marland Kitchen morphine (MSIR) 15 MG tablet Take 1 tablet (15 mg total) by mouth every 4 (four) hours as needed for severe pain. 90 tablet 0  . ondansetron (ZOFRAN) 8 MG tablet Take 1 tablet (8 mg total) by mouth every 8 (eight) hours as needed for nausea. 30 tablet 3  . pantoprazole (PROTONIX) 40 MG tablet Take 1 tablet (40 mg total) by mouth daily. Patient needs appt for further refills. 90 tablet 0  . polyethylene glycol (MIRALAX / GLYCOLAX) 17 g packet Take 17 g by mouth daily.    . predniSONE (DELTASONE) 10 MG tablet Take 0.5 tablets (5 mg total) by mouth daily with breakfast. Patient needs appt for further refills 90 tablet 0  . prochlorperazine (COMPAZINE) 10 MG tablet Take 1 tablet (10 mg total) by mouth every 6 (six) hours as needed for nausea or vomiting. 60 tablet 3  . RABEprazole (ACIPHEX) 20 MG tablet Take 1 tablet (20 mg total) by mouth daily before breakfast. 90 tablet 3  . spironolactone (ALDACTONE) 25 MG tablet Take 1 tablet (25 mg total) by mouth daily. 30 tablet 1  . vitamin C (ASCORBIC ACID) 500 MG tablet Take 500 mg by mouth daily.     No current facility-administered medications for this visit.   Facility-Administered Medications Ordered in Other Visits  Medication Dose Route Frequency Provider Last Rate Last Admin  . heparin lock flush 100 unit/mL  500 Units Intracatheter Once PRN Alvy Bimler, Larena [place], MD      . sodium chloride flush (NS) 0.9 % injection 10 mL  10 mL Intracatheter Once PRN Alvy Bimler, Royann Wildasin, MD        PHYSICAL EXAMINATION: ECOG PERFORMANCE STATUS: 1 - Symptomatic but completely ambulatory  GENERAL:alert, no distress and comfortable SKIN: skin color, texture, turgor are normal,  no rashes or significant lesions EYES: normal, Conjunctiva are pink and non-injected, sclera clear OROPHARYNX:no exudate, no erythema and lips, buccal mucosa, and tongue normal  NECK: supple, thyroid normal size, non-tender, without nodularity LYMPH:  no palpable lymphadenopathy in the cervical, axillary or inguinal LUNGS: clear to auscultation and percussion with normal breathing effort HEART: regular rate & rhythm and no murmurs and no lower extremity edema ABDOMEN:abdomen soft, non-tender and normal bowel sounds Musculoskeletal:no cyanosis of digits and no clubbing  NEURO: alert & oriented x 3 with fluent speech, no focal motor/sensory deficits  LABORATORY DATA:  I have reviewed the data as listed    Component Value Date/Time   NA 138 02/02/2021 1152   K 3.8 02/02/2021 1152   CL 102 02/02/2021 1152   CO2 24 02/02/2021 1152   GLUCOSE 214 (H) 02/02/2021 1152   BUN 20 02/02/2021 1152   CREATININE 1.07 (H) 02/02/2021 1152   CALCIUM 9.3 02/02/2021 1152   PROT 8.2 (H) 02/02/2021 1152   ALBUMIN 4.0 02/02/2021 1152   AST 13 (L) 02/02/2021 1152   ALT 14 02/02/2021 1152   ALKPHOS  89 02/02/2021 1152   BILITOT 0.5 02/02/2021 1152   GFRNONAA 59 (L) 02/02/2021 1152   GFRAA >60 09/08/2020 0819    No results found for: SPEP, UPEP  Lab Results  Component Value Date   WBC 7.0 02/02/2021   NEUTROABS 5.9 02/02/2021   HGB 10.1 (L) 02/02/2021   HCT 28.5 (L) 02/02/2021   MCV 92.5 02/02/2021   PLT 185 02/02/2021      Chemistry      Component Value Date/Time   NA 138 02/02/2021 1152   K 3.8 02/02/2021 1152   CL 102 02/02/2021 1152   CO2 24 02/02/2021 1152   BUN 20 02/02/2021 1152   CREATININE 1.07 (H) 02/02/2021 1152      Component Value Date/Time   CALCIUM 9.3 02/02/2021 1152   ALKPHOS 89 02/02/2021 1152   AST 13 (L) 02/02/2021 1152   ALT 14 02/02/2021 1152   BILITOT 0.5 02/02/2021 1152

## 2021-02-06 NOTE — Patient Instructions (Signed)
Rehydration, Adult Rehydration is the replacement of body fluids, salts, and minerals (electrolytes) that are lost during dehydration. Dehydration is when there is not enough water or other fluids in the body. This happens when you lose more fluids than you take in. Common causes of dehydration include:  Not drinking enough fluids. This can occur when you are ill or doing activities that require a lot of energy, especially in hot weather.  Conditions that cause loss of water or other fluids, such as diarrhea, vomiting, sweating, or urinating a lot.  Other illnesses, such as fever or infection.  Certain medicines, such as those that remove excess fluid from the body (diuretics). Symptoms of mild or moderate dehydration may include thirst, dry lips and mouth, and dizziness. Symptoms of severe dehydration may include increased heart rate, confusion, fainting, and not urinating. For severe dehydration, you may need to get fluids through an IV at the hospital. For mild or moderate dehydration, you can usually rehydrate at home by drinking certain fluids as told by your health care provider. What are the risks? Generally, rehydration is safe. However, taking in too much fluid (overhydration) can be a problem. This is rare. Overhydration can cause an electrolyte imbalance, kidney failure, or a decrease in salt (sodium) levels in the body. Supplies needed You will need an oral rehydration solution (ORS) if your health care provider tells you to use one. This is a drink to treat dehydration. It can be found in pharmacies and retail stores. How to rehydrate Fluids Follow instructions from your health care provider for rehydration. The kind of fluid and the amount you should drink depend on your condition. In general, you should choose drinks that you prefer.  If told by your health care provider, drink an ORS. ? Make an ORS by following instructions on the package. ? Start by drinking small amounts,  about  cup (120 mL) every 5-10 minutes. ? Slowly increase how much you drink until you have taken the amount recommended by your health care provider.  Drink enough clear fluids to keep your urine pale yellow. If you were told to drink an ORS, finish it first, then start slowly drinking other clear fluids. Drink fluids such as: ? Water. This includes sparkling water and flavored water. Drinking only water can lead to having too little sodium in your body (hyponatremia). Follow the advice of your health care provider. ? Water from ice chips you suck on. ? Fruit juice with water you add to it (diluted). ? Sports drinks. ? Hot or cold herbal teas. ? Broth-based soups. ? Milk or milk products. Food Follow instructions from your health care provider about what to eat while you rehydrate. Your health care provider may recommend that you slowly begin eating regular foods in small amounts.  Eat foods that contain a healthy balance of electrolytes, such as bananas, oranges, potatoes, tomatoes, and spinach.  Avoid foods that are greasy or contain a lot of sugar. In some cases, you may get nutrition through a feeding tube that is passed through your nose and into your stomach (nasogastric tube, or NG tube). This may be done if you have uncontrolled vomiting or diarrhea.   Beverages to avoid Certain beverages may make dehydration worse. While you rehydrate, avoid drinking alcohol.   How to tell if you are recovering from dehydration You may be recovering from dehydration if:  You are urinating more often than before you started rehydrating.  Your urine is pale yellow.  Your energy level   improves.  You vomit less frequently.  You have diarrhea less frequently.  Your appetite improves or returns to normal.  You feel less dizzy or less light-headed.  Your skin tone and color start to look more normal. Follow these instructions at home:  Take over-the-counter and prescription medicines only  as told by your health care provider.  Do not take sodium tablets. Doing this can lead to having too much sodium in your body (hypernatremia). Contact a health care provider if:  You continue to have symptoms of mild or moderate dehydration, such as: ? Thirst. ? Dry lips. ? Slightly dry mouth. ? Dizziness. ? Dark urine or less urine than normal. ? Muscle cramps.  You continue to vomit or have diarrhea. Get help right away if you:  Have symptoms of dehydration that get worse.  Have a fever.  Have a severe headache.  Have been vomiting and the following happens: ? Your vomiting gets worse or does not go away. ? Your vomit includes blood or green matter (bile). ? You cannot eat or drink without vomiting.  Have problems with urination or bowel movements, such as: ? Diarrhea that gets worse or does not go away. ? Blood in your stool (feces). This may cause stool to look black and tarry. ? Not urinating, or urinating only a small amount of very dark urine, within 6-8 hours.  Have trouble breathing.  Have symptoms that get worse with treatment. These symptoms may represent a serious problem that is an emergency. Do not wait to see if the symptoms will go away. Get medical help right away. Call your local emergency services (911 in the [place] Do not drive yourself to the hospital. Summary  Rehydration is the replacement of body fluids and minerals (electrolytes) that are lost during dehydration.  Follow instructions from your health care provider for rehydration. The kind of fluid and amount you should drink depend on your condition.  Slowly increase how much you drink until you have taken the amount recommended by your health care provider.  Contact your health care provider if you continue to show signs of mild or moderate dehydration. This information is not intended to replace advice given to you by your health care provider. Make sure you discuss any questions you have with  your health care provider. Document Revised: 02/02/2020 Document Reviewed: 12/13/2019 Elsevier Patient Education  2021 [employer] Inc.  

## 2021-02-06 NOTE — Assessment & Plan Note (Signed)
She tolerated cycle 1 of treatment well Overall, her symptoms are not improved I will see her again prior to cycle 2

## 2021-02-06 NOTE — Telephone Encounter (Signed)
Scheduled per 2/22 sch msg. Called pt and left a msg

## 2021-02-06 NOTE — Assessment & Plan Note (Signed)
She denies recent nausea or vomiting She has antiemetics to take as needed

## 2021-02-06 NOTE — Assessment & Plan Note (Signed)
She denies recent constipation We discussed the importance of aggressive laxative therapy

## 2021-02-06 NOTE — Assessment & Plan Note (Signed)
She was clinically dehydrated when I saw her last week With additional daily IV fluids, her symptoms have resolved Recommend the patient to consider holding of diuretic therapy in the near future if she could not tolerate oral fluid

## 2021-02-06 NOTE — Progress Notes (Signed)
Prior to completion of IVF completion, patient complained of some nausea. Dr. Alvy Bimler made aware and received orders for 8 mg of IV zofran. Patient received medication and completed their infusion. Patient's vitals retaken and patient stated that she felt a little better and was able to go home. Patient deaccessed and provided with paperwork. Upon leaving, patient asked to use the restroom and left belongings at seat. Nurse checked on patient and patient stated that she had vomited. Patient provided emesis bag and Sandi Mealy, PA-C notified. Patient continued to have active episodes of vomiting. Sandi Mealy, PA-C assessed patient and provided orders for 0.5 mg of sublingual ativan.  Patient reports feeling better. Patient transported via wheelchair to lobby. Family present.

## 2021-02-06 NOTE — Assessment & Plan Note (Signed)
Her pain is well controlled She will continue to take pain medicine as needed

## 2021-02-07 ENCOUNTER — Ambulatory Visit: Payer: Medicare HMO

## 2021-02-07 ENCOUNTER — Inpatient Hospital Stay: Payer: Medicare HMO

## 2021-02-07 ENCOUNTER — Telehealth: Payer: Self-pay

## 2021-02-07 DIAGNOSIS — R112 Nausea with vomiting, unspecified: Secondary | ICD-10-CM | POA: Diagnosis not present

## 2021-02-07 DIAGNOSIS — Z5111 Encounter for antineoplastic chemotherapy: Secondary | ICD-10-CM | POA: Diagnosis not present

## 2021-02-07 DIAGNOSIS — C541 Malignant neoplasm of endometrium: Secondary | ICD-10-CM | POA: Diagnosis not present

## 2021-02-07 MED ORDER — SODIUM CHLORIDE 0.9 % IV SOLN
Freq: Once | INTRAVENOUS | Status: AC
  Filled 2021-02-07: qty 250

## 2021-02-07 MED ORDER — ONDANSETRON HCL 4 MG/2ML IJ SOLN
8.0000 mg | Freq: Once | INTRAMUSCULAR | Status: AC
  Administered 2021-02-07: 8 mg via INTRAVENOUS

## 2021-02-07 MED ORDER — SODIUM CHLORIDE 0.9 % IV SOLN: Freq: Once | INTRAVENOUS | Status: DC

## 2021-02-07 MED ORDER — ONDANSETRON HCL 4 MG/2ML IJ SOLN
INTRAMUSCULAR | Status: AC
  Filled 2021-02-07: qty 4

## 2021-02-07 NOTE — Patient Instructions (Signed)
Rehydration, Adult Rehydration is the replacement of body fluids, salts, and minerals (electrolytes) that are lost during dehydration. Dehydration is when there is not enough water or other fluids in the body. This happens when you lose more fluids than you take in. Common causes of dehydration include:  Not drinking enough fluids. This can occur when you are ill or doing activities that require a lot of energy, especially in hot weather.  Conditions that cause loss of water or other fluids, such as diarrhea, vomiting, sweating, or urinating a lot.  Other illnesses, such as fever or infection.  Certain medicines, such as those that remove excess fluid from the body (diuretics). Symptoms of mild or moderate dehydration may include thirst, dry lips and mouth, and dizziness. Symptoms of severe dehydration may include increased heart rate, confusion, fainting, and not urinating. For severe dehydration, you may need to get fluids through an IV at the hospital. For mild or moderate dehydration, you can usually rehydrate at home by drinking certain fluids as told by your health care provider. What are the risks? Generally, rehydration is safe. However, taking in too much fluid (overhydration) can be a problem. This is rare. Overhydration can cause an electrolyte imbalance, kidney failure, or a decrease in salt (sodium) levels in the body. Supplies needed You will need an oral rehydration solution (ORS) if your health care provider tells you to use one. This is a drink to treat dehydration. It can be found in pharmacies and retail stores. How to rehydrate Fluids Follow instructions from your health care provider for rehydration. The kind of fluid and the amount you should drink depend on your condition. In general, you should choose drinks that you prefer.  If told by your health care provider, drink an ORS. ? Make an ORS by following instructions on the package. ? Start by drinking small amounts,  about  cup (120 mL) every 5-10 minutes. ? Slowly increase how much you drink until you have taken the amount recommended by your health care provider.  Drink enough clear fluids to keep your urine pale yellow. If you were told to drink an ORS, finish it first, then start slowly drinking other clear fluids. Drink fluids such as: ? Water. This includes sparkling water and flavored water. Drinking only water can lead to having too little sodium in your body (hyponatremia). Follow the advice of your health care provider. ? Water from ice chips you suck on. ? Fruit juice with water you add to it (diluted). ? Sports drinks. ? Hot or cold herbal teas. ? Broth-based soups. ? Milk or milk products. Food Follow instructions from your health care provider about what to eat while you rehydrate. Your health care provider may recommend that you slowly begin eating regular foods in small amounts.  Eat foods that contain a healthy balance of electrolytes, such as bananas, oranges, potatoes, tomatoes, and spinach.  Avoid foods that are greasy or contain a lot of sugar. In some cases, you may get nutrition through a feeding tube that is passed through your nose and into your stomach (nasogastric tube, or NG tube). This may be done if you have uncontrolled vomiting or diarrhea.   Beverages to avoid Certain beverages may make dehydration worse. While you rehydrate, avoid drinking alcohol.   How to tell if you are recovering from dehydration You may be recovering from dehydration if:  You are urinating more often than before you started rehydrating.  Your urine is pale yellow.  Your energy level   improves.  You vomit less frequently.  You have diarrhea less frequently.  Your appetite improves or returns to normal.  You feel less dizzy or less light-headed.  Your skin tone and color start to look more normal. Follow these instructions at home:  Take over-the-counter and prescription medicines only  as told by your health care provider.  Do not take sodium tablets. Doing this can lead to having too much sodium in your body (hypernatremia). Contact a health care provider if:  You continue to have symptoms of mild or moderate dehydration, such as: ? Thirst. ? Dry lips. ? Slightly dry mouth. ? Dizziness. ? Dark urine or less urine than normal. ? Muscle cramps.  You continue to vomit or have diarrhea. Get help right away if you:  Have symptoms of dehydration that get worse.  Have a fever.  Have a severe headache.  Have been vomiting and the following happens: ? Your vomiting gets worse or does not go away. ? Your vomit includes blood or green matter (bile). ? You cannot eat or drink without vomiting.  Have problems with urination or bowel movements, such as: ? Diarrhea that gets worse or does not go away. ? Blood in your stool (feces). This may cause stool to look black and tarry. ? Not urinating, or urinating only a small amount of very dark urine, within 6-8 hours.  Have trouble breathing.  Have symptoms that get worse with treatment. These symptoms may represent a serious problem that is an emergency. Do not wait to see if the symptoms will go away. Get medical help right away. Call your local emergency services (911 in the [place] Do not drive yourself to the hospital. Summary  Rehydration is the replacement of body fluids and minerals (electrolytes) that are lost during dehydration.  Follow instructions from your health care provider for rehydration. The kind of fluid and amount you should drink depend on your condition.  Slowly increase how much you drink until you have taken the amount recommended by your health care provider.  Contact your health care provider if you continue to show signs of mild or moderate dehydration. This information is not intended to replace advice given to you by your health care provider. Make sure you discuss any questions you have with  your health care provider. Document Revised: 02/02/2020 Document Reviewed: 12/13/2019 Elsevier Patient Education  2021 [employer] Inc.  

## 2021-02-07 NOTE — Telephone Encounter (Signed)
She called and left a message to call her.  Called back. She is complaining of diarrhea, x 4 since she got home. Complaining of vomiting, x 3 since she got home. She ate watermelon yesterday and then the vomiting and diarrhea started. She is able to drink Pedialyte, her daughter went to get her some Gatorade. Instructed to take Zofran and Compazine both, alternate them. She verbalzied understanding. Scheduled IV fluids for 2 pm today, instructed to arrive at 1:45. She verbalized understanding.

## 2021-02-08 ENCOUNTER — Ambulatory Visit: Payer: Medicare HMO

## 2021-02-09 ENCOUNTER — Ambulatory Visit: Payer: Medicare HMO

## 2021-02-09 NOTE — Progress Notes (Signed)
The patient was seen in the infusion room as she was receiving IV fluids.  She reported having nausea.  She was given Zofran 8 mg IV.  Despite this she continued to have nausea and vomiting.  She was given sublingual Ativan 0.5 mg x 1 with improvement in her nausea.  Sandi Mealy, MHS, PA-C Physician Assistant

## 2021-02-13 ENCOUNTER — Encounter: Payer: Self-pay | Admitting: General Practice

## 2021-02-13 NOTE — Progress Notes (Signed)
[facility] Spiritual Care Note  Followed up with Juliann Pulse by phone. She reports feeling much better physically after getting IV fluids, and, with the support of family and church members, feeling better emotionally since the death of her coworker. Having her church members and bishop call for support has been very encouraging, and she is continuing to look for meaning and purpose in her illness and treatment. Sira has my direct dial number and plans to reach out as needed.   [place], [place], [facility] Pager [phone] Voicemail [phone]

## 2021-02-19 ENCOUNTER — Telehealth: Payer: Self-pay

## 2021-02-19 NOTE — Telephone Encounter (Signed)
She called and left a message to call her to review appt date/times.  Called back and reviewed upcoming appts. She verbalized understanding.

## 2021-02-26 ENCOUNTER — Other Ambulatory Visit: Payer: Self-pay

## 2021-02-26 ENCOUNTER — Inpatient Hospital Stay: Payer: Medicare HMO | Attending: Hematology and Oncology

## 2021-02-26 ENCOUNTER — Inpatient Hospital Stay: Payer: Medicare HMO

## 2021-02-26 ENCOUNTER — Other Ambulatory Visit: Payer: Self-pay | Admitting: Hematology and Oncology

## 2021-02-26 ENCOUNTER — Inpatient Hospital Stay: Payer: Medicare HMO | Admitting: Hematology and Oncology

## 2021-02-26 ENCOUNTER — Encounter: Payer: Self-pay | Admitting: Hematology and Oncology

## 2021-02-26 ENCOUNTER — Telehealth: Payer: Self-pay | Admitting: Hematology and Oncology

## 2021-02-26 VITALS — BP 171/78 | HR 64

## 2021-02-26 DIAGNOSIS — C541 Malignant neoplasm of endometrium: Secondary | ICD-10-CM

## 2021-02-26 DIAGNOSIS — I10 Essential (primary) hypertension: Secondary | ICD-10-CM

## 2021-02-26 DIAGNOSIS — D6481 Anemia due to antineoplastic chemotherapy: Secondary | ICD-10-CM

## 2021-02-26 DIAGNOSIS — C7802 Secondary malignant neoplasm of left lung: Secondary | ICD-10-CM | POA: Insufficient documentation

## 2021-02-26 DIAGNOSIS — R600 Localized edema: Secondary | ICD-10-CM

## 2021-02-26 DIAGNOSIS — Z5111 Encounter for antineoplastic chemotherapy: Secondary | ICD-10-CM | POA: Insufficient documentation

## 2021-02-26 DIAGNOSIS — T451X5A Adverse effect of antineoplastic and immunosuppressive drugs, initial encounter: Secondary | ICD-10-CM

## 2021-02-26 DIAGNOSIS — G620 Drug-induced polyneuropathy: Secondary | ICD-10-CM | POA: Diagnosis not present

## 2021-02-26 DIAGNOSIS — G893 Neoplasm related pain (acute) (chronic): Secondary | ICD-10-CM | POA: Diagnosis not present

## 2021-02-26 DIAGNOSIS — C775 Secondary and unspecified malignant neoplasm of intrapelvic lymph nodes: Secondary | ICD-10-CM | POA: Diagnosis not present

## 2021-02-26 LAB — CBC WITH DIFFERENTIAL (CANCER CENTER ONLY)
Abs Immature Granulocytes: 0.09 10*3/uL — ABNORMAL HIGH (ref 0.00–0.07)
Basophils Absolute: 0 10*3/uL (ref 0.0–0.1)
Basophils Relative: 0 %
Eosinophils Absolute: 0 10*3/uL (ref 0.0–0.5)
Eosinophils Relative: 0 %
HCT: 26.6 % — ABNORMAL LOW (ref 36.0–46.0)
Hemoglobin: 9.2 g/dL — ABNORMAL LOW (ref 12.0–15.0)
Immature Granulocytes: 1 %
Lymphocytes Relative: 11 %
Lymphs Abs: 0.9 10*3/uL (ref 0.7–4.0)
MCH: 32.7 pg (ref 26.0–34.0)
MCHC: 34.6 g/dL (ref 30.0–36.0)
MCV: 94.7 fL (ref 80.0–100.0)
Monocytes Absolute: 0.3 10*3/uL (ref 0.1–1.0)
Monocytes Relative: 4 %
Neutro Abs: 6.9 10*3/uL (ref 1.7–7.7)
Neutrophils Relative %: 84 %
Platelet Count: 257 10*3/uL (ref 150–400)
RBC: 2.81 MIL/uL — ABNORMAL LOW (ref 3.87–5.11)
RDW: 15.5 % (ref 11.5–15.5)
WBC Count: 8.2 10*3/uL (ref 4.0–10.5)
nRBC: 0 % (ref 0.0–0.2)

## 2021-02-26 LAB — CMP (CANCER CENTER ONLY)
ALT: 25 U/L (ref 0–44)
AST: 15 U/L (ref 15–41)
Albumin: 3.7 g/dL (ref 3.5–5.0)
Alkaline Phosphatase: 109 U/L (ref 38–126)
Anion gap: 9 (ref 5–15)
BUN: 27 mg/dL — ABNORMAL HIGH (ref 8–23)
CO2: 24 mmol/L (ref 22–32)
Calcium: 9.5 mg/dL (ref 8.9–10.3)
Chloride: 105 mmol/L (ref 98–111)
Creatinine: 0.94 mg/dL (ref 0.44–1.00)
GFR, Estimated: >60 mL/min (ref >60)
Glucose, Bld: 165 mg/dL — ABNORMAL HIGH (ref 70–99)
Potassium: 4.5 mmol/L (ref 3.5–5.1)
Sodium: 138 mmol/L (ref 135–145)
Total Bilirubin: 0.3 mg/dL (ref 0.3–1.2)
Total Protein: 8.3 g/dL — ABNORMAL HIGH (ref 6.5–8.1)

## 2021-02-26 MED ORDER — SODIUM CHLORIDE 0.9 % IV SOLN
10.0000 mg | Freq: Once | INTRAVENOUS | Status: AC
  Administered 2021-02-26: 10 mg via INTRAVENOUS
  Filled 2021-02-26: qty 10

## 2021-02-26 MED ORDER — PALONOSETRON HCL INJECTION 0.25 MG/5ML
INTRAVENOUS | Status: AC
  Filled 2021-02-26: qty 5

## 2021-02-26 MED ORDER — DIPHENHYDRAMINE HCL 50 MG/ML IJ SOLN
INTRAMUSCULAR | Status: AC
  Filled 2021-02-26: qty 1

## 2021-02-26 MED ORDER — FAMOTIDINE IN NACL 20-0.9 MG/50ML-% IV SOLN
INTRAVENOUS | Status: AC
  Filled 2021-02-26: qty 50

## 2021-02-26 MED ORDER — SODIUM CHLORIDE 0.9% FLUSH
10.0000 mL | INTRAVENOUS | Status: DC | PRN
  Administered 2021-02-26: 10 mL
  Filled 2021-02-26: qty 10

## 2021-02-26 MED ORDER — DIPHENHYDRAMINE HCL 50 MG/ML IJ SOLN
25.0000 mg | Freq: Once | INTRAMUSCULAR | Status: AC
  Administered 2021-02-26: 25 mg via INTRAVENOUS

## 2021-02-26 MED ORDER — SODIUM CHLORIDE 0.9 % IV SOLN
60.0000 mg/m2 | Freq: Once | INTRAVENOUS | Status: AC
  Administered 2021-02-26: 130 mg via INTRAVENOUS
  Filled 2021-02-26: qty 13

## 2021-02-26 MED ORDER — PALONOSETRON HCL INJECTION 0.25 MG/5ML
0.2500 mg | Freq: Once | INTRAVENOUS | Status: AC
  Administered 2021-02-26: 0.25 mg via INTRAVENOUS

## 2021-02-26 MED ORDER — CARBOPLATIN CHEMO INJECTION 600 MG/60ML
601.0000 mg | Freq: Once | INTRAVENOUS | Status: AC
Start: 2021-02-26 — End: 2021-02-26
  Administered 2021-02-26: 600 mg via INTRAVENOUS
  Filled 2021-02-26: qty 60

## 2021-02-26 MED ORDER — HEPARIN SOD (PORK) LOCK FLUSH 100 UNIT/ML IV SOLN
500.0000 [IU] | Freq: Once | INTRAVENOUS | Status: AC | PRN
  Administered 2021-02-26: 500 [IU]
  Filled 2021-02-26: qty 5

## 2021-02-26 MED ORDER — SODIUM CHLORIDE 0.9 % IV SOLN
Freq: Once | INTRAVENOUS | Status: AC
  Filled 2021-02-26: qty 250

## 2021-02-26 MED ORDER — SODIUM CHLORIDE 0.9% FLUSH
10.0000 mL | Freq: Once | INTRAVENOUS | Status: AC
  Administered 2021-02-26: 10 mL
  Filled 2021-02-26: qty 10

## 2021-02-26 MED ORDER — SODIUM CHLORIDE 0.9 % IV SOLN
150.0000 mg | Freq: Once | INTRAVENOUS | Status: AC
  Administered 2021-02-26: 150 mg via INTRAVENOUS
  Filled 2021-02-26: qty 150

## 2021-02-26 MED ORDER — FAMOTIDINE IN NACL 20-0.9 MG/50ML-% IV SOLN
20.0000 mg | Freq: Once | INTRAVENOUS | Status: AC
  Administered 2021-02-26: 20 mg via INTRAVENOUS

## 2021-02-26 NOTE — Assessment & Plan Note (Signed)
Her pain is well controlled She will continue to take pain medicine as needed

## 2021-02-26 NOTE — Assessment & Plan Note (Signed)
Her leg swelling has improved Continue close observation

## 2021-02-26 NOTE — Assessment & Plan Note (Signed)
She denies worsening peripheral neuropathy from treatment.  Observe closely for now 

## 2021-02-26 NOTE — Assessment & Plan Note (Signed)
Her blood pressure is profoundly elevated but could be due to anxiety I recommend checking her blood pressure twice a day along with her heart rate and weight

## 2021-02-26 NOTE — Assessment & Plan Note (Signed)
She has persistent anemia, stable Observe closely for now

## 2021-02-26 NOTE — Assessment & Plan Note (Signed)
She has fully recovered from side effects of recent treatment Leg swelling has improved She has minimum abdominal pain or nausea Her appetite has improved We discussed modification to current cycle with additional IV fluid support for 2 days after chemotherapy to avoid complications from dehydration and nausea I also plan to schedule a virtual visit end of the week to follow-up on toxicity review and blood pressure monitoring at home

## 2021-02-26 NOTE — Telephone Encounter (Signed)
Scheduled appointments per 3/14 sch msg. Spoke to patient who is aware of appointments dates and times. Gave patient calendar print out.

## 2021-02-26 NOTE — Progress Notes (Signed)
[facility] OFFICE PROGRESS NOTE  Patient Care Team: [name], [name], MD as PCP - General (Family Medicine)  ASSESSMENT & PLAN:  Endometrial cancer [facility]) She has fully recovered from side effects of recent treatment Leg swelling has improved She has minimum abdominal pain or nausea Her appetite has improved We discussed modification to current cycle with additional IV fluid support for 2 days after chemotherapy to avoid complications from dehydration and nausea I also plan to schedule a virtual visit end of the week to follow-up on toxicity review and blood pressure monitoring at home  Anemia due to antineoplastic chemotherapy She has persistent anemia, stable Observe closely for now  Cancer associated pain Her pain is well controlled She will continue to take pain medicine as needed  Bilateral lower extremity edema Her leg swelling has improved Continue close observation  Essential hypertension Her blood pressure is profoundly elevated but could be due to anxiety I recommend checking her blood pressure twice a day along with her heart rate and weight  Peripheral neuropathy due to chemotherapy [facility]) She denies worsening peripheral neuropathy from treatment.  Observe closely for now   No orders of the defined types were placed in this encounter.   All questions were answered. The patient knows to call the clinic with any problems, questions or concerns. The total time spent in the appointment was 30 minutes encounter with patients including review of chart and various tests results, discussions about plan of care and coordination of care plan   [name], MD 02/26/2021 6:45 PM  INTERVAL HISTORY: Please see below for problem oriented charting. She returns with her son to be seen prior to cycle 2 of treatment She felt better since last time I saw her All the side effects from previous treatment has resolved She denies worsening peripheral neuropathy Her  appetite is fair No recent changes in bowel habits Her leg swelling has improved Her pain is well controlled  SUMMARY OF ONCOLOGIC HISTORY: Oncology History Overview Note  ER/PR neg, MSI stable   Endometrial cancer ([place])  06/15/2018 Surgery   Surgeon: Donaciano Eva     Operation: Robotic-assisted laparoscopic total hysterectomy with bilateral salpingoophorectomy, SLN biopsy     Operative Findings:  : 6cm uterus, normal tubes and ovaries, omentum adherent to the anterior abdominal wall and uterine fundus. No suspicious nodes.   06/15/2018 Pathology Results   Procedure: Total hysterectomy with bilateral salpingo-oophorectomy. Bilateral obturator sentinel lymph node biopsies. Histologic type: Endometrioid adenocarcinoma. Histologic Grade: FIGO grade 1. Myometrial invasion: Depth of invasion: 15 mm Myometrial thickness: 20 mm Uterine Serosa Involvement: Not identified. Cervical stromal involvement: Not identified. Extent of involvement of other organs: Uninvolved. Lymphovascular invasion: Present. Regional Lymph Nodes: Examined: 2 Sentinel 0 Non-sentinel 2 Total Lymph nodes with metastasis: 0 Isolated tumor cells (< 0.2 mm): 2 Micrometastasis: (> 0.2 mm and < 2.0 mm): 0 Macrometastasis: (> 2.0 mm): 0 Extracapsular extension: N/A. Tumor block for ancillary studies: 4Q-S. MMR / MSI testing: Will be ordered. Pathologic Stage Classification (pTNM, AJCC 8th edition): pT1b, pN0(i+)   07/20/2018 Imaging   1. Surgical changes from recent hysterectomy. No findings suspicious for residual tumor or adenopathy in the pelvis. 2. No CT findings to suggest omental, peritoneal surface or solid organ metastatic disease in the abdomen. 3. Changes consistent with known sarcoidosis in the chest with bulky calcified mediastinal and hilar lymph nodes and interstitial lung disease. I do not see any definite CT findings to suggest metastatic disease involving the chest.  08/19/2018 - 09/17/2018  Radiation Therapy   08/19/18 - 09/17/18: Vaginal Cuff, 3 cm cylinder with treatment length of 3 cm / 30 Gy delivered in 5 fractions of 6 Gy, Brachytherapy / HDR, Iridium-192   05/2005: Left Chest Wall keloid   04/2004 - 05/2004: Anus, pelvis /inguinal ( Dr. Tammi Klippel)   01/19/2020 Imaging   1. No definite CT findings of the abdomen or pelvis to explain pain. 2. The sigmoid colon and rectum are decompressed although slightly thickened appearing, suggestive of nonspecific infectious, inflammatory, or ischemic colitis. Correlate for referable clinical symptoms. 3. There is trace, nonspecific free fluid in the low pelvis, which may be reactive, although would be difficult to distinguish from postoperative and post treatment soft tissue change in the pelvis given history of hysterectomy and rectal cancer. 4. Unchanged prominent retroperitoneal and iliac lymph nodes. No new findings suspicious for metastatic disease in the abdomen or pelvis. 5. Bibasilar pulmonary fibrosis and mediastinal and hilar lymphadenopathy, better evaluated by prior CT chest and consistent with patient diagnosis of sarcoidosis.       04/18/2020 Imaging   Venous Doppler Left:  - No evidence of deep vein thrombosis seen in the left lower extremity, from the common femoral through the popliteal veins.  - No evidence of superficial venous thrombosis in the left lower extremity.  - There is no evidence of venous reflux seen in the left lower extremity.  - Rouleax flow noted in the superficial system.    05/18/2020 PET scan   1. Complex scan with a combination of hypermetabolic granulomatous disease (sarcoidosis) as well as hypermetabolic metastatic endometrial carcinoma. 2. Hypermetabolic mediastinal hilar lymph nodes and pulmonary nodularity with bronchovascular thickening is consistent with sarcoidosis. 3. Solitary hypermetabolic nodule in the medial LEFT lower lobe, new from prior with intense metabolic activity, is concerning for a  metastatic deposit to the LEFT lower lobe. 4. Ill-defined hypermetabolic soft tissue along the LEFT operator space is most consistent with local recurrence of endometrial carcinoma. There is entrapment of the LEFT ureter by this hypermetabolic soft tissue with mild hydroureter and hydronephrosis on the LEFT. 5. Additionally there appears to be direct extension from the hypermetabolic left operator space soft tissue to the sigmoid colon with there is a well-defined hypermetabolic lesion in the colon. 6. More distant hypermetabolic peritoneal metastasis along the ventral peritoneal surface of the RIGHT abdomen beneath RIGHT rectus abdominus muscle. 7. Hypermetabolic lymph nodes in the porta hepatis are favored related to sarcoidosis.   05/19/2020 Cancer Staging   Staging form: Corpus Uteri - Carcinoma and Carcinosarcoma, AJCC 8th Edition - Clinical stage from 05/19/2020: Stage IVB (rcT1, cN2a, cM1) - Signed by [name], MD on 05/19/2020   05/26/2020 Procedure   Successful placement of a right internal jugular approach power injectable Port-A-Cath. The catheter is ready for immediate use   05/29/2020 - 09/27/2020 Chemotherapy   The patient had carboplatin and taxol for chemotherapy treatment.     07/31/2020 Imaging   1. Asymmetric soft tissue mass extending from the left vaginal cuff has decreased in size in the interval. No residual peritoneal nodularity. Pulmonary metastases are stable with exception of a minimally improved left lower lobe nodule. 2. Stable soft tissue nodule associated with the right rectus abdominus muscle. 3. Interval resolution of left hydronephrosis. 4. Calcified mediastinal and hilar lymph nodes, together with pulmonary parenchymal fibrosis, findings which are in keeping with patient's known diagnosis of sarcoid. 5. Aortic atherosclerosis (ICD10-I70.0). Coronary artery calcification.   10/27/2020 Imaging   1.  Essentially stable appearance of the soft tissue density nodule  posteriorly in the left rectus abdominus muscle, currently 1.8 by 1.2 cm, previously 11 mm. 2. Stable calcified adenopathy in the mediastinum and hila, compatible with prior granulomatous disease. 3. Stable small right lower lobe pulmonary nodules. 4. Stable soft tissue density prominence along the left vaginal cuff extending up towards the adnexa, probably therapy related. 5. Other imaging findings of potential clinical significance: Coronary atherosclerosis. Small type 1 hiatal hernia. Stable subpleural reticulation favoring the lung bases, possibly from mild fibrosis. 6. Aortic atherosclerosis.   01/19/2021 Imaging   1. Status post hysterectomy. Interval enlargement of soft tissue adjacent to the left aspect of the vaginal cuff and bladder, measuring approximately 4.3 x 2.9 cm in axial section, previously previously previously 3.7 x 1.8 cm when measured similarly. Extensive soft tissue thickening and stranding throughout the low pelvis. 2. Interval enlargement of a metastatic nodule within the right rectus abdominus. New small peritoneal nodule anterior to the sigmoid colon. 3. Interval enlargement of retroperitoneal retroperitoneal lymph nodes. 4. Multiple small bilateral pulmonary nodules, several of which are new and enlarged. 5. Constellation of findings above is consistent with worsened metastatic disease. 6. New left hydronephrosis and hydroureter, the distal left ureter obstructed by soft tissue near the left aspect of the urinary bladder and vaginal cuff. 7. Unchanged enlarged, partially calcified mediastinal and hilar lymph nodes,, in keeping with prior granulomatous infection or sarcoidosis. 8. Mild underlying pulmonary fibrosis, in a UIP pattern. 9. Coronary artery disease.   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     02/02/2021 -  Chemotherapy    Patient is on Treatment Plan: OVARIAN CARBOPLATIN / DOCETAXEL Q21D        REVIEW OF SYSTEMS:   Constitutional: Denies fevers, chills or abnormal weight loss Eyes: Denies blurriness of vision Ears, nose, mouth, throat, and face: Denies mucositis or sore throat Respiratory: Denies cough, dyspnea or wheezes Cardiovascular: Denies palpitation, chest discomfort  Gastrointestinal:  Denies nausea, heartburn or change in bowel habits Skin: Denies abnormal skin rashes Lymphatics: Denies new lymphadenopathy or easy bruising Neurological:Denies numbness, tingling or new weaknesses Behavioral/Psych: Mood is stable, no new changes  All other systems were reviewed with the patient and are negative.  I have reviewed the past medical history, past surgical history, social history and family history with the patient and they are unchanged from previous note.  ALLERGIES:  is allergic to shellfish allergy and penicillins.  MEDICATIONS:  Current Outpatient Medications  Medication Sig Dispense Refill  . albuterol (VENTOLIN HFA) 108 (90 Base) MCG/ACT inhaler INHALE 2 PUFFS INTO THE LUNGS EVERY 6 HOURS AS NEEDED FOR WHEEZING OR SHORTNESS OF BREATH (NEED MD APPOINTMENT) 3 each 0  . bisoprolol (ZEBETA) 10 MG tablet Take 1 tablet (10 mg total) by mouth in the morning  and at bedtime. 180 tablet 0  . budesonide-formoterol (SYMBICORT) 80-4.5 MCG/ACT inhaler Inhale 2 puffs into  the lungs 2 (two) times daily. 3 Inhaler 3  . desonide (DESOWEN) 0.05 % lotion Apply topically 2 (two) times daily. Continue following with dermatologist 59 mL 0  . dexamethasone (DECADRON) 4 MG tablet Take 2 tablets (8 mg total) by mouth 2 (two) times daily. Take decadron  Twice daily the day before taxotere then daily x 3 days after carboplatin  chemo 30 tablet 1  . dextromethorphan (DELSYM) 30 MG/5ML liquid Take by mouth as needed for cough.     . hydrochlorothiazide (HYDRODIURIL) 25 MG tablet Take 25 mg by mouth daily.    . hydrocortisone 2.5 % cream Apply 1 application topically 2 (two) times daily.    Marland Kitchen loratadine (CLARITIN) 10 MG tablet Take 10 mg by mouth daily as needed for allergies.    Marland Kitchen morphine (MSIR) 15 MG tablet Take 1 tablet (15 mg total) by mouth every 4 (four) hours as needed for severe pain. 90 tablet 0  . ondansetron (ZOFRAN) 8 MG tablet Take 1 tablet (8 mg total) by mouth every 8 (eight) hours as needed for nausea. 30 tablet 3  . pantoprazole (PROTONIX) 40 MG tablet Take 1 tablet (40 mg total) by mouth daily. Patient needs appt for further refills. 90 tablet 0  . polyethylene glycol (MIRALAX / GLYCOLAX) 17 g packet Take 17 g by mouth daily.    . predniSONE (DELTASONE) 10 MG tablet Take 0.5 tablets (5 mg total) by mouth daily with breakfast. Patient needs appt for further refills 90 tablet 0  . prochlorperazine (COMPAZINE) 10 MG tablet Take 1 tablet (10 mg total) by mouth every 6 (six) hours as needed for nausea or vomiting. 60 tablet 3  . RABEprazole (ACIPHEX) 20 MG tablet Take 1 tablet (20 mg total) by mouth daily before breakfast. 90 tablet 3  . spironolactone (ALDACTONE) 25 MG tablet Take 1 tablet (25 mg total) by mouth daily. 30 tablet 1  . vitamin C (ASCORBIC ACID) 500 MG tablet Take 500 mg by mouth daily.     No current facility-administered medications for this visit.   Facility-Administered Medications Ordered in Other Visits  Medication Dose Route Frequency Provider  Last Rate Last Admin  . sodium chloride flush (NS) 0.9 % injection 10 mL  10 mL Intracatheter PRN Alvy Bimler, Esabella Stockinger, MD   10 mL at 02/26/21 1634    PHYSICAL EXAMINATION: ECOG PERFORMANCE STATUS: 2 - Symptomatic, <50% confined to bed  Vitals:   02/26/21 1210  BP: (!) 191/90  Pulse: 81  Resp: 18  Temp: 98 F (36.7 C)  SpO2: 98%   Filed Weights   02/26/21 1210  Weight: 214 lb 3.2 oz (97.2 kg)    GENERAL:alert, no distress and comfortable SKIN: skin color, texture, turgor are normal, no rashes or significant lesions EYES: normal, Conjunctiva are pink and non-injected, sclera clear OROPHARYNX:no exudate, no erythema and lips, buccal mucosa, and tongue normal  NECK: supple, thyroid normal size, non-tender, without nodularity LYMPH:  no palpable lymphadenopathy in the cervical, axillary or inguinal LUNGS: clear to auscultation and percussion with normal breathing effort HEART: regular rate & rhythm and no murmurs with moderate bilateral lower extremity edema ABDOMEN:abdomen soft, non-tender and normal bowel sounds Musculoskeletal:no cyanosis of digits and no clubbing  NEURO: alert & oriented x 3 with fluent speech, no focal motor/sensory deficits  LABORATORY DATA:  I have reviewed the data as listed    Component Value  Date/Time   NA 138 02/26/2021 1201   K 4.5 02/26/2021 1201   CL 105 02/26/2021 1201   CO2 24 02/26/2021 1201   GLUCOSE 165 (H) 02/26/2021 1201   BUN 27 (H) 02/26/2021 1201   CREATININE 0.94 02/26/2021 1201   CALCIUM 9.5 02/26/2021 1201   PROT 8.3 (H) 02/26/2021 1201   ALBUMIN 3.7 02/26/2021 1201   AST 15 02/26/2021 1201   ALT 25 02/26/2021 1201   ALKPHOS 109 02/26/2021 1201   BILITOT 0.3 02/26/2021 1201   GFRNONAA >60 02/26/2021 1201   GFRAA >60 09/08/2020 0819    No results found for: SPEP, UPEP  Lab Results  Component Value Date   WBC 8.2 02/26/2021   NEUTROABS 6.9 02/26/2021   HGB 9.2 (L) 02/26/2021   HCT 26.6 (L) 02/26/2021   MCV 94.7 02/26/2021    PLT 257 02/26/2021      Chemistry      Component Value Date/Time   NA 138 02/26/2021 1201   K 4.5 02/26/2021 1201   CL 105 02/26/2021 1201   CO2 24 02/26/2021 1201   BUN 27 (H) 02/26/2021 1201   CREATININE 0.94 02/26/2021 1201      Component Value Date/Time   CALCIUM 9.5 02/26/2021 1201   ALKPHOS 109 02/26/2021 1201   AST 15 02/26/2021 1201   ALT 25 02/26/2021 1201   BILITOT 0.3 02/26/2021 1201

## 2021-02-26 NOTE — Patient Instructions (Signed)
[facility] Discharge Instructions for Patients Receiving Chemotherapy  Today you received the following chemotherapy agents Docetaxol and Carboplatin  To help prevent nausea and vomiting after your treatment, we encourage you to take your nausea medication as prescibed   If you develop nausea and vomiting that is not controlled by your nausea medication, call the clinic.   BELOW ARE SYMPTOMS THAT SHOULD BE REPORTED IMMEDIATELY:  *FEVER GREATER THAN 100.5 F  *CHILLS WITH OR WITHOUT FEVER  NAUSEA AND VOMITING THAT IS NOT CONTROLLED WITH YOUR NAUSEA MEDICATION  *UNUSUAL SHORTNESS OF BREATH  *UNUSUAL BRUISING OR BLEEDING  TENDERNESS IN MOUTH AND THROAT WITH OR WITHOUT PRESENCE OF ULCERS  *URINARY PROBLEMS  *BOWEL PROBLEMS  UNUSUAL RASH Items with * indicate a potential emergency and should be followed up as soon as possible.  Feel free to call the clinic should you have any questions or concerns. The clinic phone number is [phone].  Please show the [place] at check-in to the Emergency Department and triage nurse.

## 2021-02-27 ENCOUNTER — Other Ambulatory Visit: Payer: Self-pay

## 2021-02-27 ENCOUNTER — Inpatient Hospital Stay: Payer: Medicare HMO

## 2021-02-27 VITALS — BP 156/81 | HR 62 | Temp 98.1°F | Resp 18

## 2021-02-27 DIAGNOSIS — C541 Malignant neoplasm of endometrium: Secondary | ICD-10-CM

## 2021-02-27 DIAGNOSIS — C7802 Secondary malignant neoplasm of left lung: Secondary | ICD-10-CM | POA: Diagnosis not present

## 2021-02-27 DIAGNOSIS — C775 Secondary and unspecified malignant neoplasm of intrapelvic lymph nodes: Secondary | ICD-10-CM | POA: Diagnosis not present

## 2021-02-27 DIAGNOSIS — Z5111 Encounter for antineoplastic chemotherapy: Secondary | ICD-10-CM | POA: Diagnosis not present

## 2021-02-27 MED ORDER — HEPARIN SOD (PORK) LOCK FLUSH 100 UNIT/ML IV SOLN
500.0000 [IU] | Freq: Once | INTRAVENOUS | Status: AC | PRN
  Administered 2021-02-27: 500 [IU]
  Filled 2021-02-27: qty 5

## 2021-02-27 MED ORDER — SODIUM CHLORIDE 0.9 % IV SOLN
Freq: Once | INTRAVENOUS | Status: AC
  Filled 2021-02-27: qty 250

## 2021-02-27 MED ORDER — SODIUM CHLORIDE 0.9% FLUSH
10.0000 mL | Freq: Once | INTRAVENOUS | Status: DC | PRN
  Filled 2021-02-27: qty 10

## 2021-02-27 NOTE — Patient Instructions (Signed)
Dehydration, Adult Dehydration is condition in which there is not enough water or other fluids in the body. This happens when a person loses more fluids than he or she takes in. Important body parts cannot work right without the right amount of fluids. Any loss of fluids from the body can cause dehydration. Dehydration can be mild, worse, or very bad. It should be treated right away to keep it from getting very bad. What are the causes? This condition may be caused by:  Conditions that cause loss of water or other fluids, such as: ? Watery poop (diarrhea). ? Vomiting. ? Sweating a lot. ? Peeing (urinating) a lot.  Not drinking enough fluids, especially when you: ? Are ill. ? Are doing things that take a lot of energy to do.  Other illnesses and conditions, such as fever or infection.  Certain medicines, such as medicines that take extra fluid out of the body (diuretics).  Lack of safe drinking water.  Not being able to get enough water and food. What increases the risk? The following factors may make you more likely to develop this condition:  Having a long-term (chronic) illness that has not been treated the right way, such as: ? Diabetes. ? Heart disease. ? Kidney disease.  Being 65 years of age or older.  Having a disability.  Living in a place that is high above the ground or sea (high in altitude). The thinner, dried air causes more fluid loss.  Doing exercises that put stress on your body for a long time. What are the signs or symptoms? Symptoms of dehydration depend on how bad it is. Mild or worse dehydration  Thirst.  Dry lips or dry mouth.  Feeling dizzy or light-headed, especially when you stand up from sitting.  Muscle cramps.  Your body making: ? Dark pee (urine). Pee may be the color of tea. ? Less pee than normal. ? Less tears than normal.  Headache. Very bad dehydration  Changes in skin. Skin may: ? Be cold to the touch (clammy). ? Be blotchy  or pale. ? Not go back to normal right after you lightly pinch it and let it go.  Little or no tears, pee, or sweat.  Changes in vital signs, such as: ? Fast breathing. ? Low blood pressure. ? Weak pulse. ? Pulse that is more than 100 beats a minute when you are sitting still.  Other changes, such as: ? Feeling very thirsty. ? Eyes that look hollow (sunken). ? Cold hands and feet. ? Being mixed up (confused). ? Being very tired (lethargic) or having trouble waking from sleep. ? Short-term weight loss. ? Loss of consciousness. How is this treated? Treatment for this condition depends on how bad it is. Treatment should start right away. Do not wait until your condition gets very bad. Very bad dehydration is an emergency. You will need to go to a hospital.  Mild or worse dehydration can be treated at home. You may be asked to: ? Drink more fluids. ? Drink an oral rehydration solution (ORS). This drink helps get the right amounts of fluids and salts and minerals in the blood (electrolytes).  Very bad dehydration can be treated: ? With fluids through an IV tube. ? By getting normal levels of salts and minerals in your blood. This is often done by giving salts and minerals through a tube. The tube is passed through your nose and into your stomach. ? By treating the root cause. Follow these instructions at   home: Oral rehydration solution If told by your doctor, drink an ORS:  Make an ORS. Use instructions on the package.  Start by drinking small amounts, about  cup (120 mL) every 5-10 minutes.  Slowly drink more until you have had the amount that your doctor said to have. Eating and drinking  Drink enough clear fluid to keep your pee pale yellow. If you were told to drink an ORS, finish the ORS first. Then, start slowly drinking other clear fluids. Drink fluids such as: ? Water. Do not drink only water. Doing that can make the salt (sodium) level in your body get too low. ? Water  from ice chips you suck on. ? Fruit juice that you have added water to (diluted). ? Low-calorie sports drinks.  Eat foods that have the right amounts of salts and minerals, such as: ? Bananas. ? Oranges. ? Potatoes. ? Tomatoes. ? Spinach.  Do not drink alcohol.  Avoid: ? Drinks that have a lot of sugar. These include:  High-calorie sports drinks.  Fruit juice that you did not add water to.  Soda.  Caffeine. ? Foods that are greasy or have a lot of fat or sugar.         General instructions  Take over-the-counter and prescription medicines only as told by your doctor.  Do not take salt tablets. Doing that can make the salt level in your body get too high.  Return to your normal activities as told by your doctor. Ask your doctor what activities are safe for you.  Keep all follow-up visits as told by your doctor. This is important. Contact a doctor if:  You have pain in your belly (abdomen) and the pain: ? Gets worse. ? Stays in one place.  You have a rash.  You have a stiff neck.  You get angry or annoyed (irritable) more easily than normal.  You are more tired or have a harder time waking than normal.  You feel: ? Weak or dizzy. ? Very thirsty. Get help right away if you have:  Any symptoms of very bad dehydration.  Symptoms of vomiting, such as: ? You cannot eat or drink without vomiting. ? Your vomiting gets worse or does not go away. ? Your vomit has blood or green stuff in it.  Symptoms that get worse with treatment.  A fever.  A very bad headache.  Problems with peeing or pooping (having a bowel movement), such as: ? Watery poop that gets worse or does not go away. ? Blood in your poop (stool). This may cause poop to look black and tarry. ? Not peeing in 6-8 hours. ? Peeing only a small amount of very dark pee in 6-8 hours.  Trouble breathing. These symptoms may be an emergency. Do not wait to see if the symptoms will go away. Get  medical help right away. Call your local emergency services (911 in the [place] Do not drive yourself to the hospital. Summary  Dehydration is a condition in which there is not enough water or other fluids in the body. This happens when a person loses more fluids than he or she takes in.  Treatment for this condition depends on how bad it is. Treatment should be started right away. Do not wait until your condition gets very bad.  Drink enough clear fluid to keep your pee pale yellow. If you were told to drink an oral rehydration solution (ORS), finish the ORS first. Then, start slowly drinking other clear fluids.    Take over-the-counter and prescription medicines only as told by your doctor.  Get help right away if you have any symptoms of very bad dehydration. This information is not intended to replace advice given to you by your health care provider. Make sure you discuss any questions you have with your health care provider. Document Revised: 07/15/2019 Document Reviewed: 07/15/2019 Elsevier Patient Education  2021 [employer] Inc[employer]  

## 2021-02-28 ENCOUNTER — Ambulatory Visit: Payer: Medicare HMO

## 2021-03-01 ENCOUNTER — Telehealth (HOSPITAL_BASED_OUTPATIENT_CLINIC_OR_DEPARTMENT_OTHER): Payer: Medicare HMO | Admitting: Hematology and Oncology

## 2021-03-01 ENCOUNTER — Ambulatory Visit: Payer: Medicare HMO | Admitting: Hematology and Oncology

## 2021-03-01 ENCOUNTER — Other Ambulatory Visit: Payer: Self-pay

## 2021-03-01 ENCOUNTER — Encounter: Payer: Self-pay | Admitting: Hematology and Oncology

## 2021-03-01 DIAGNOSIS — I10 Essential (primary) hypertension: Secondary | ICD-10-CM | POA: Diagnosis not present

## 2021-03-01 DIAGNOSIS — E860 Dehydration: Secondary | ICD-10-CM

## 2021-03-01 DIAGNOSIS — C541 Malignant neoplasm of endometrium: Secondary | ICD-10-CM | POA: Diagnosis not present

## 2021-03-01 NOTE — Assessment & Plan Note (Signed)
She tolerated recent cycle of treatment better Continue supportive care She is instructed to call if she suffer side effects such as uncontrolled nausea and vomiting again

## 2021-03-01 NOTE — Progress Notes (Signed)
HEMATOLOGY-ONCOLOGY ELECTRONIC VISIT PROGRESS NOTE  Patient Care Team: [name], [name], MD as PCP - General (Family Medicine)  I connected with by telephone visit.   ASSESSMENT & PLAN:  Endometrial cancer ([place]) She tolerated recent cycle of treatment better Continue supportive care She is instructed to call if she suffer side effects such as uncontrolled nausea and vomiting again  Essential hypertension Her documented blood pressure at home is improved I recommend resumption of hydrochlorothiazide I have asked her to continue blood pressure monitoring twice a day  Dehydration I have verified with the patient that she is able to hydrate herself adequately She does not feel that she needs to come in for IV fluid support   No orders of the defined types were placed in this encounter.   INTERVAL HISTORY: Please see below for problem oriented charting. The purpose of today's visit is to discuss symptom management due to recent uncontrolled nausea vomiting She is feeling much better She denies side effects from this cycle of treatment She has checked her blood pressure along with a heart rate and weight twice daily  On 315, morning blood pressure 156/81, heart rate 62, weight 214 pounds Evening blood pressure, 130/80, heart rate 56  On March 16, morning blood pressure 135/66, heart rate 59 Evening blood pressure 146/74, heart rate 66, weight 211 pounds  SUMMARY OF ONCOLOGIC HISTORY: Oncology History Overview Note  ER/PR neg, MSI stable   Endometrial cancer ([place])  06/15/2018 Surgery   Surgeon: Donaciano Eva     Operation: Robotic-assisted laparoscopic total hysterectomy with bilateral salpingoophorectomy, SLN biopsy     Operative Findings:  : 6cm uterus, normal tubes and ovaries, omentum adherent to the anterior abdominal wall and uterine fundus. No suspicious nodes.   06/15/2018 Pathology Results   Procedure: Total hysterectomy with bilateral salpingo-oophorectomy.  Bilateral obturator sentinel lymph node biopsies. Histologic type: Endometrioid adenocarcinoma. Histologic Grade: FIGO grade 1. Myometrial invasion: Depth of invasion: 15 mm Myometrial thickness: 20 mm Uterine Serosa Involvement: Not identified. Cervical stromal involvement: Not identified. Extent of involvement of other organs: Uninvolved. Lymphovascular invasion: Present. Regional Lymph Nodes: Examined: 2 Sentinel 0 Non-sentinel 2 Total Lymph nodes with metastasis: 0 Isolated tumor cells (< 0.2 mm): 2 Micrometastasis: (> 0.2 mm and < 2.0 mm): 0 Macrometastasis: (> 2.0 mm): 0 Extracapsular extension: N/A. Tumor block for ancillary studies: 4Q-S. MMR / MSI testing: Will be ordered. Pathologic Stage Classification (pTNM, AJCC 8th edition): pT1b, pN0(i+)   07/20/2018 Imaging   1. Surgical changes from recent hysterectomy. No findings suspicious for residual tumor or adenopathy in the pelvis. 2. No CT findings to suggest omental, peritoneal surface or solid organ metastatic disease in the abdomen. 3. Changes consistent with known sarcoidosis in the chest with bulky calcified mediastinal and hilar lymph nodes and interstitial lung disease. I do not see any definite CT findings to suggest metastatic disease involving the chest.   08/19/2018 - 09/17/2018 Radiation Therapy   08/19/18 - 09/17/18: Vaginal Cuff, 3 cm cylinder with treatment length of 3 cm / 30 Gy delivered in 5 fractions of 6 Gy, Brachytherapy / HDR, Iridium-192   05/2005: Left Chest Wall keloid   04/2004 - 05/2004: Anus, pelvis /inguinal ( Dr. Tammi Klippel)   01/19/2020 Imaging   1. No definite CT findings of the abdomen or pelvis to explain pain. 2. The sigmoid colon and rectum are decompressed although slightly thickened appearing, suggestive of nonspecific infectious, inflammatory, or ischemic colitis. Correlate for referable clinical symptoms. 3. There is trace, nonspecific  free fluid in the low pelvis, which may be reactive,  although would be difficult to distinguish from postoperative and post treatment soft tissue change in the pelvis given history of hysterectomy and rectal cancer. 4. Unchanged prominent retroperitoneal and iliac lymph nodes. No new findings suspicious for metastatic disease in the abdomen or pelvis. 5. Bibasilar pulmonary fibrosis and mediastinal and hilar lymphadenopathy, better evaluated by prior CT chest and consistent with patient diagnosis of sarcoidosis.       04/18/2020 Imaging   Venous Doppler Left:  - No evidence of deep vein thrombosis seen in the left lower extremity, from the common femoral through the popliteal veins.  - No evidence of superficial venous thrombosis in the left lower extremity.  - There is no evidence of venous reflux seen in the left lower extremity.  - Rouleax flow noted in the superficial system.    05/18/2020 PET scan   1. Complex scan with a combination of hypermetabolic granulomatous disease (sarcoidosis) as well as hypermetabolic metastatic endometrial carcinoma. 2. Hypermetabolic mediastinal hilar lymph nodes and pulmonary nodularity with bronchovascular thickening is consistent with sarcoidosis. 3. Solitary hypermetabolic nodule in the medial LEFT lower lobe, new from prior with intense metabolic activity, is concerning for a metastatic deposit to the LEFT lower lobe. 4. Ill-defined hypermetabolic soft tissue along the LEFT operator space is most consistent with local recurrence of endometrial carcinoma. There is entrapment of the LEFT ureter by this hypermetabolic soft tissue with mild hydroureter and hydronephrosis on the LEFT. 5. Additionally there appears to be direct extension from the hypermetabolic left operator space soft tissue to the sigmoid colon with there is a well-defined hypermetabolic lesion in the colon. 6. More distant hypermetabolic peritoneal metastasis along the ventral peritoneal surface of the RIGHT abdomen beneath RIGHT rectus abdominus  muscle. 7. Hypermetabolic lymph nodes in the porta hepatis are favored related to sarcoidosis.   05/19/2020 Cancer Staging   Staging form: Corpus Uteri - Carcinoma and Carcinosarcoma, AJCC 8th Edition - Clinical stage from 05/19/2020: Stage IVB (rcT1, cN2a, cM1) - Signed by [name], MD on 05/19/2020   05/26/2020 Procedure   Successful placement of a right internal jugular approach power injectable Port-A-Cath. The catheter is ready for immediate use   05/29/2020 - 09/27/2020 Chemotherapy   The patient had carboplatin and taxol for chemotherapy treatment.     07/31/2020 Imaging   1. Asymmetric soft tissue mass extending from the left vaginal cuff has decreased in size in the interval. No residual peritoneal nodularity. Pulmonary metastases are stable with exception of a minimally improved left lower lobe nodule. 2. Stable soft tissue nodule associated with the right rectus abdominus muscle. 3. Interval resolution of left hydronephrosis. 4. Calcified mediastinal and hilar lymph nodes, together with pulmonary parenchymal fibrosis, findings which are in keeping with patient's known diagnosis of sarcoid. 5. Aortic atherosclerosis (ICD10-I70.0). Coronary artery calcification.   10/27/2020 Imaging   1. Essentially stable appearance of the soft tissue density nodule posteriorly in the left rectus abdominus muscle, currently 1.8 by 1.2 cm, previously 11 mm. 2. Stable calcified adenopathy in the mediastinum and hila, compatible with prior granulomatous disease. 3. Stable small right lower lobe pulmonary nodules. 4. Stable soft tissue density prominence along the left vaginal cuff extending up towards the adnexa, probably therapy related. 5. Other imaging findings of potential clinical significance: Coronary atherosclerosis. Small type 1 hiatal hernia. Stable subpleural reticulation favoring the lung bases, possibly from mild fibrosis. 6. Aortic atherosclerosis.   01/19/2021 Imaging   1. Status  post  hysterectomy. Interval enlargement of soft tissue adjacent to the left aspect of the vaginal cuff and bladder, measuring approximately 4.3 x 2.9 cm in axial section, previously previously previously 3.7 x 1.8 cm when measured similarly. Extensive soft tissue thickening and stranding throughout the low pelvis. 2. Interval enlargement of a metastatic nodule within the right rectus abdominus. New small peritoneal nodule anterior to the sigmoid colon. 3. Interval enlargement of retroperitoneal retroperitoneal lymph nodes. 4. Multiple small bilateral pulmonary nodules, several of which are new and enlarged. 5. Constellation of findings above is consistent with worsened metastatic disease. 6. New left hydronephrosis and hydroureter, the distal left ureter obstructed by soft tissue near the left aspect of the urinary bladder and vaginal cuff. 7. Unchanged enlarged, partially calcified mediastinal and hilar lymph nodes,, in keeping with prior granulomatous infection or sarcoidosis. 8. Mild underlying pulmonary fibrosis, in a UIP pattern. 9. Coronary artery disease.   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     02/02/2021 -  Chemotherapy    Patient is on Treatment Plan: OVARIAN CARBOPLATIN / DOCETAXEL Q21D        REVIEW OF SYSTEMS:    Constitutional: Denies fevers, chills or abnormal weight loss Eyes: Denies blurriness of vision Ears, nose, mouth, throat, and face: Denies mucositis or sore throat Respiratory: Denies cough, dyspnea or wheezes Cardiovascular: Denies palpitation, chest discomfort Gastrointestinal:  Denies nausea, heartburn or change in bowel habits Skin: Denies abnormal skin rashes Lymphatics: Denies new lymphadenopathy or easy bruising Neurological:Denies numbness, tingling or new weaknesses Behavioral/Psych: Mood is stable, no new changes  Extremities: No lower extremity edema All other systems were reviewed with the patient and are negative.  I have reviewed the past medical history, past surgical history, social history and family history with the patient and they are unchanged from previous note.  ALLERGIES:  is allergic to shellfish allergy and penicillins.  MEDICATIONS:  Current Outpatient Medications  Medication Sig Dispense Refill  . albuterol (VENTOLIN HFA) 108 (90 Base) MCG/ACT inhaler INHALE 2 PUFFS INTO THE LUNGS EVERY 6 HOURS AS NEEDED FOR WHEEZING OR SHORTNESS OF BREATH (NEED MD APPOINTMENT) 3 each 0  . bisoprolol (ZEBETA) 10 MG tablet Take 1 tablet (10 mg total) by mouth in the morning and at bedtime. 180 tablet 0  . budesonide-formoterol (SYMBICORT) 80-4.5 MCG/ACT inhaler Inhale 2 puffs into the lungs 2 (two) times daily. 3 Inhaler 3  . desonide (DESOWEN) 0.05 % lotion Apply topically 2 (two) times daily. Continue following with dermatologist 59 mL 0  . dexamethasone (DECADRON) 4 MG tablet Take 2 tablets (8 mg total) by mouth 2 (two) times daily. Take decadron  Twice daily the day before taxotere then daily x 3 days after carboplatin  chemo 30 tablet 1  . dextromethorphan (DELSYM) 30 MG/5ML liquid Take by mouth as needed  for cough.     . hydrochlorothiazide (HYDRODIURIL) 25 MG tablet Take 25 mg by mouth daily.    . hydrocortisone 2.5 % cream Apply 1 application topically 2 (two) times  daily.    Marland Kitchen loratadine (CLARITIN) 10 MG tablet Take 10 mg by mouth daily as needed for allergies.    Marland Kitchen morphine (MSIR) 15 MG tablet Take 1 tablet (15 mg total) by mouth every 4 (four) hours as needed for severe pain. 90 tablet 0  . ondansetron (ZOFRAN) 8 MG tablet Take 1 tablet (8 mg total) by mouth every 8 (eight) hours as needed for nausea. 30 tablet 3  . pantoprazole (PROTONIX) 40 MG tablet Take 1 tablet (40 mg total) by mouth daily. Patient needs appt for further refills. 90 tablet 0  . polyethylene glycol (MIRALAX / GLYCOLAX) 17 g packet Take 17 g by mouth daily.    . predniSONE (DELTASONE) 10 MG tablet Take 0.5 tablets (5 mg total) by mouth daily with breakfast. Patient needs appt for further refills 90 tablet 0  . prochlorperazine (COMPAZINE) 10 MG tablet Take 1 tablet (10 mg total) by mouth every 6 (six) hours as needed for nausea or vomiting. 60 tablet 3  . RABEprazole (ACIPHEX) 20 MG tablet Take 1 tablet (20 mg total) by mouth daily before breakfast. 90 tablet 3  . spironolactone (ALDACTONE) 25 MG tablet Take 1 tablet (25 mg total) by mouth daily. 30 tablet 1  . vitamin C (ASCORBIC ACID) 500 MG tablet Take 500 mg by mouth daily.     No current facility-administered medications for this visit.    PHYSICAL EXAMINATION: ECOG PERFORMANCE STATUS: 1 - Symptomatic but completely ambulatory  LABORATORY DATA:  I have reviewed the data as listed CMP Latest Ref Rng & Units 02/26/2021 02/02/2021 01/09/2021  Glucose 70 - 99 mg/dL 165(H) 214(H) 157(H)  BUN 8 - 23 mg/dL 27(H) 20 18  Creatinine 0.44 - 1.00 mg/dL 0.94 1.07(H) 0.92  Sodium 135 - 145 mmol/L 138 138 144  Potassium 3.5 - 5.1 mmol/L 4.5 3.8 3.4(L)  Chloride 98 - 111 mmol/L 105 102 108  CO2 22 - 32 mmol/L 24 24 25   Calcium 8.9 - 10.3 mg/dL 9.5 9.3 9.3  Total Protein 6.5 - 8.1 g/dL 8.3(H) 8.2(H) 8.4(H)  Total Bilirubin 0.3 - 1.2 mg/dL 0.3 0.5 0.5  Alkaline Phos 38 - 126 U/L 109 89 94  AST 15 - 41 U/L 15 13(L) 14(L)  ALT 0 - 44 U/L 25  14 13     Lab Results  Component Value Date   WBC 8.2 02/26/2021   HGB 9.2 (L) 02/26/2021   HCT 26.6 (L) 02/26/2021   MCV 94.7 02/26/2021   PLT 257 02/26/2021   NEUTROABS 6.9 02/26/2021   I discussed the assessment and treatment plan with the patient. The patient was provided an opportunity to ask questions and all were answered. The patient agreed with the plan and demonstrated an understanding of the instructions. The patient was advised to call back or seek an in-person evaluation if the symptoms worsen or if the condition fails to improve as anticipated.    I spent 20 minutes for the appointment reviewing test results, discuss management and coordination of care.  [name], MD 03/01/2021 10:26 AM

## 2021-03-01 NOTE — Assessment & Plan Note (Signed)
Her documented blood pressure at home is improved I recommend resumption of hydrochlorothiazide I have asked her to continue blood pressure monitoring twice a day 

## 2021-03-01 NOTE — Assessment & Plan Note (Signed)
I have verified with the patient that she is able to hydrate herself adequately She does not feel that she needs to come in for IV fluid support

## 2021-03-20 ENCOUNTER — Encounter: Payer: Self-pay | Admitting: Hematology and Oncology

## 2021-03-20 ENCOUNTER — Inpatient Hospital Stay: Payer: Medicare HMO

## 2021-03-20 ENCOUNTER — Inpatient Hospital Stay: Payer: Medicare HMO | Attending: Hematology and Oncology

## 2021-03-20 ENCOUNTER — Inpatient Hospital Stay (HOSPITAL_BASED_OUTPATIENT_CLINIC_OR_DEPARTMENT_OTHER): Payer: Medicare HMO | Admitting: Hematology and Oncology

## 2021-03-20 ENCOUNTER — Other Ambulatory Visit: Payer: Self-pay

## 2021-03-20 DIAGNOSIS — C775 Secondary and unspecified malignant neoplasm of intrapelvic lymph nodes: Secondary | ICD-10-CM | POA: Insufficient documentation

## 2021-03-20 DIAGNOSIS — C541 Malignant neoplasm of endometrium: Secondary | ICD-10-CM | POA: Diagnosis not present

## 2021-03-20 DIAGNOSIS — Z5111 Encounter for antineoplastic chemotherapy: Secondary | ICD-10-CM | POA: Diagnosis not present

## 2021-03-20 DIAGNOSIS — T451X5A Adverse effect of antineoplastic and immunosuppressive drugs, initial encounter: Secondary | ICD-10-CM | POA: Diagnosis not present

## 2021-03-20 DIAGNOSIS — I10 Essential (primary) hypertension: Secondary | ICD-10-CM | POA: Diagnosis not present

## 2021-03-20 DIAGNOSIS — C7802 Secondary malignant neoplasm of left lung: Secondary | ICD-10-CM

## 2021-03-20 DIAGNOSIS — D61818 Other pancytopenia: Secondary | ICD-10-CM | POA: Diagnosis not present

## 2021-03-20 DIAGNOSIS — G620 Drug-induced polyneuropathy: Secondary | ICD-10-CM | POA: Diagnosis not present

## 2021-03-20 LAB — CBC WITH DIFFERENTIAL (CANCER CENTER ONLY)
Abs Immature Granulocytes: 0.09 10*3/uL — ABNORMAL HIGH (ref 0.00–0.07)
Basophils Absolute: 0 10*3/uL (ref 0.0–0.1)
Basophils Relative: 0 %
Eosinophils Absolute: 0 10*3/uL (ref 0.0–0.5)
Eosinophils Relative: 0 %
HCT: 24.7 % — ABNORMAL LOW (ref 36.0–46.0)
Hemoglobin: 8.6 g/dL — ABNORMAL LOW (ref 12.0–15.0)
Immature Granulocytes: 1 %
Lymphocytes Relative: 9 %
Lymphs Abs: 0.8 10*3/uL (ref 0.7–4.0)
MCH: 33.5 pg (ref 26.0–34.0)
MCHC: 34.8 g/dL (ref 30.0–36.0)
MCV: 96.1 fL (ref 80.0–100.0)
Monocytes Absolute: 0.4 10*3/uL (ref 0.1–1.0)
Monocytes Relative: 4 %
Neutro Abs: 7.9 10*3/uL — ABNORMAL HIGH (ref 1.7–7.7)
Neutrophils Relative %: 86 %
Platelet Count: 135 10*3/uL — ABNORMAL LOW (ref 150–400)
RBC: 2.57 MIL/uL — ABNORMAL LOW (ref 3.87–5.11)
RDW: 18.8 % — ABNORMAL HIGH (ref 11.5–15.5)
WBC Count: 9.2 10*3/uL (ref 4.0–10.5)
nRBC: 0.4 % — ABNORMAL HIGH (ref 0.0–0.2)

## 2021-03-20 LAB — CMP (CANCER CENTER ONLY)
ALT: 24 U/L (ref 0–44)
AST: 15 U/L (ref 15–41)
Albumin: 3.9 g/dL (ref 3.5–5.0)
Alkaline Phosphatase: 109 U/L (ref 38–126)
Anion gap: 15 (ref 5–15)
BUN: 23 mg/dL (ref 8–23)
CO2: 21 mmol/L — ABNORMAL LOW (ref 22–32)
Calcium: 9.4 mg/dL (ref 8.9–10.3)
Chloride: 104 mmol/L (ref 98–111)
Creatinine: 0.95 mg/dL (ref 0.44–1.00)
GFR, Estimated: >60 mL/min (ref >60)
Glucose, Bld: 162 mg/dL — ABNORMAL HIGH (ref 70–99)
Potassium: 4.4 mmol/L (ref 3.5–5.1)
Sodium: 140 mmol/L (ref 135–145)
Total Bilirubin: 0.4 mg/dL (ref 0.3–1.2)
Total Protein: 8.3 g/dL — ABNORMAL HIGH (ref 6.5–8.1)

## 2021-03-20 MED ORDER — DIPHENHYDRAMINE HCL 50 MG/ML IJ SOLN
25.0000 mg | Freq: Once | INTRAMUSCULAR | Status: AC
Start: 2021-03-20 — End: 2021-03-20
  Administered 2021-03-20: 25 mg via INTRAVENOUS

## 2021-03-20 MED ORDER — FAMOTIDINE IN NACL 20-0.9 MG/50ML-% IV SOLN
20.0000 mg | Freq: Once | INTRAVENOUS | Status: AC
  Administered 2021-03-20: 20 mg via INTRAVENOUS

## 2021-03-20 MED ORDER — HEPARIN SOD (PORK) LOCK FLUSH 100 UNIT/ML IV SOLN
500.0000 [IU] | Freq: Once | INTRAVENOUS | Status: AC | PRN
  Administered 2021-03-20: 500 [IU]
  Filled 2021-03-20: qty 5

## 2021-03-20 MED ORDER — PALONOSETRON HCL INJECTION 0.25 MG/5ML
0.2500 mg | Freq: Once | INTRAVENOUS | Status: AC
Start: 2021-03-20 — End: 2021-03-20
  Administered 2021-03-20: 0.25 mg via INTRAVENOUS

## 2021-03-20 MED ORDER — SODIUM CHLORIDE 0.9 % IV SOLN
Freq: Once | INTRAVENOUS | Status: AC
Start: 2021-03-20 — End: 2021-03-20
  Filled 2021-03-20: qty 250

## 2021-03-20 MED ORDER — FAMOTIDINE IN NACL 20-0.9 MG/50ML-% IV SOLN
INTRAVENOUS | Status: AC
  Filled 2021-03-20: qty 50

## 2021-03-20 MED ORDER — SODIUM CHLORIDE 0.9 % IV SOLN
60.0000 mg/m2 | Freq: Once | INTRAVENOUS | Status: AC
  Administered 2021-03-20: 130 mg via INTRAVENOUS
  Filled 2021-03-20: qty 13

## 2021-03-20 MED ORDER — SODIUM CHLORIDE 0.9 % IV SOLN
150.0000 mg | Freq: Once | INTRAVENOUS | Status: AC
  Administered 2021-03-20: 150 mg via INTRAVENOUS
  Filled 2021-03-20: qty 150

## 2021-03-20 MED ORDER — SODIUM CHLORIDE 0.9 % IV SOLN
596.0000 mg | Freq: Once | INTRAVENOUS | Status: AC
  Administered 2021-03-20: 600 mg via INTRAVENOUS
  Filled 2021-03-20: qty 60

## 2021-03-20 MED ORDER — SODIUM CHLORIDE 0.9 % IV SOLN
10.0000 mg | Freq: Once | INTRAVENOUS | Status: AC
  Administered 2021-03-20: 10 mg via INTRAVENOUS
  Filled 2021-03-20: qty 10

## 2021-03-20 MED ORDER — PALONOSETRON HCL INJECTION 0.25 MG/5ML
INTRAVENOUS | Status: AC
  Filled 2021-03-20: qty 5

## 2021-03-20 MED ORDER — SODIUM CHLORIDE 0.9% FLUSH
10.0000 mL | Freq: Once | INTRAVENOUS | Status: AC
Start: 2021-03-20 — End: 2021-03-20
  Administered 2021-03-20: 10 mL
  Filled 2021-03-20: qty 10

## 2021-03-20 MED ORDER — DIPHENHYDRAMINE HCL 50 MG/ML IJ SOLN
INTRAMUSCULAR | Status: AC
  Filled 2021-03-20: qty 1

## 2021-03-20 MED ORDER — SODIUM CHLORIDE 0.9% FLUSH
10.0000 mL | INTRAVENOUS | Status: DC | PRN
  Administered 2021-03-20: 10 mL
  Filled 2021-03-20: qty 10

## 2021-03-20 NOTE — Assessment & Plan Note (Signed)
She has persistent pancytopenia due to treatment but not symptomatic Observe closely for now She does not need transfusion support

## 2021-03-20 NOTE — Assessment & Plan Note (Signed)
She denies worsening peripheral neuropathy from treatment.  Observe closely for now

## 2021-03-20 NOTE — Patient Instructions (Signed)
[facility] Discharge Instructions for Patients Receiving Chemotherapy  Today you received the following chemotherapy agents Docetaxel(Taxotere), Carboplatin/.  To help prevent nausea and vomiting after your treatment, we encourage you to take your nausea medication as directed.    If you develop nausea and vomiting that is not controlled by your nausea medication, call the clinic.   BELOW ARE SYMPTOMS THAT SHOULD BE REPORTED IMMEDIATELY:  *FEVER GREATER THAN 100.5 F  *CHILLS WITH OR WITHOUT FEVER  NAUSEA AND VOMITING THAT IS NOT CONTROLLED WITH YOUR NAUSEA MEDICATION  *UNUSUAL SHORTNESS OF BREATH  *UNUSUAL BRUISING OR BLEEDING  TENDERNESS IN MOUTH AND THROAT WITH OR WITHOUT PRESENCE OF ULCERS  *URINARY PROBLEMS  *BOWEL PROBLEMS  UNUSUAL RASH Items with * indicate a potential emergency and should be followed up as soon as possible.  Feel free to call the clinic should you have any questions or concerns. The clinic phone number is [phone].  Please show the [facility] at check-in to the Emergency Department and triage nurse.

## 2021-03-20 NOTE — Assessment & Plan Note (Signed)
Her documented blood pressure at home is improved I recommend resumption of hydrochlorothiazide I have asked her to continue blood pressure monitoring twice a day

## 2021-03-20 NOTE — Progress Notes (Signed)
[facility] OFFICE PROGRESS NOTE  Patient Care Team: [name], [name], MD as PCP - General (Family Medicine)  ASSESSMENT & PLAN:  Endometrial cancer ([place]) Overall, she appears to be responding to treatment clinically She has no further abdominal pain and has not taken any pain medicine recently She denies worsening peripheral neuropathy We will continue treatment as scheduled  Pancytopenia, acquired ([place]) She has persistent pancytopenia due to treatment but not symptomatic Observe closely for now She does not need transfusion support  Peripheral neuropathy due to chemotherapy [facility]) She denies worsening peripheral neuropathy from treatment.  Observe closely for now  Essential hypertension Her documented blood pressure at home is improved I recommend resumption of hydrochlorothiazide I have asked her to continue blood pressure monitoring twice a day   No orders of the defined types were placed in this encounter.   All questions were answered. The patient knows to call the clinic with any problems, questions or concerns. The total time spent in the appointment was 20 minutes encounter with patients including review of chart and various tests results, discussions about plan of care and coordination of care plan   [name], MD 03/20/2021 12:38 PM  INTERVAL HISTORY: Please see below for problem oriented charting. She returns for further follow-up She is doing very well She tolerated chemo very well without further nausea or vomiting No worsening peripheral neuropathy Her documented blood pressure at home is satisfactory.  SUMMARY OF ONCOLOGIC HISTORY: Oncology History Overview Note  ER/PR neg, MSI stable   Endometrial cancer ([place])  06/15/2018 Surgery   Surgeon: Donaciano Eva     Operation: Robotic-assisted laparoscopic total hysterectomy with bilateral salpingoophorectomy, SLN biopsy     Operative Findings:  : 6cm uterus, normal tubes and ovaries,  omentum adherent to the anterior abdominal wall and uterine fundus. No suspicious nodes.   06/15/2018 Pathology Results   Procedure: Total hysterectomy with bilateral salpingo-oophorectomy. Bilateral obturator sentinel lymph node biopsies. Histologic type: Endometrioid adenocarcinoma. Histologic Grade: FIGO grade 1. Myometrial invasion: Depth of invasion: 15 mm Myometrial thickness: 20 mm Uterine Serosa Involvement: Not identified. Cervical stromal involvement: Not identified. Extent of involvement of other organs: Uninvolved. Lymphovascular invasion: Present. Regional Lymph Nodes: Examined: 2 Sentinel 0 Non-sentinel 2 Total Lymph nodes with metastasis: 0 Isolated tumor cells (< 0.2 mm): 2 Micrometastasis: (> 0.2 mm and < 2.0 mm): 0 Macrometastasis: (> 2.0 mm): 0 Extracapsular extension: N/A. Tumor block for ancillary studies: 4Q-S. MMR / MSI testing: Will be ordered. Pathologic Stage Classification (pTNM, AJCC 8th edition): pT1b, pN0(i+)   07/20/2018 Imaging   1. Surgical changes from recent hysterectomy. No findings suspicious for residual tumor or adenopathy in the pelvis. 2. No CT findings to suggest omental, peritoneal surface or solid organ metastatic disease in the abdomen. 3. Changes consistent with known sarcoidosis in the chest with bulky calcified mediastinal and hilar lymph nodes and interstitial lung disease. I do not see any definite CT findings to suggest metastatic disease involving the chest.   08/19/2018 - 09/17/2018 Radiation Therapy   08/19/18 - 09/17/18: Vaginal Cuff, 3 cm cylinder with treatment length of 3 cm / 30 Gy delivered in 5 fractions of 6 Gy, Brachytherapy / HDR, Iridium-192   05/2005: Left Chest Wall keloid   04/2004 - 05/2004: Anus, pelvis /inguinal ( Dr. Tammi Klippel)   01/19/2020 Imaging   1. No definite CT findings of the abdomen or pelvis to explain pain. 2. The sigmoid colon and rectum are decompressed although slightly thickened appearing, suggestive  of  nonspecific infectious, inflammatory, or ischemic colitis. Correlate for referable clinical symptoms. 3. There is trace, nonspecific free fluid in the low pelvis, which may be reactive, although would be difficult to distinguish from postoperative and post treatment soft tissue change in the pelvis given history of hysterectomy and rectal cancer. 4. Unchanged prominent retroperitoneal and iliac lymph nodes. No new findings suspicious for metastatic disease in the abdomen or pelvis. 5. Bibasilar pulmonary fibrosis and mediastinal and hilar lymphadenopathy, better evaluated by prior CT chest and consistent with patient diagnosis of sarcoidosis.       04/18/2020 Imaging   Venous Doppler Left:  - No evidence of deep vein thrombosis seen in the left lower extremity, from the common femoral through the popliteal veins.  - No evidence of superficial venous thrombosis in the left lower extremity.  - There is no evidence of venous reflux seen in the left lower extremity.  - Rouleax flow noted in the superficial system.    05/18/2020 PET scan   1. Complex scan with a combination of hypermetabolic granulomatous disease (sarcoidosis) as well as hypermetabolic metastatic endometrial carcinoma. 2. Hypermetabolic mediastinal hilar lymph nodes and pulmonary nodularity with bronchovascular thickening is consistent with sarcoidosis. 3. Solitary hypermetabolic nodule in the medial LEFT lower lobe, new from prior with intense metabolic activity, is concerning for a metastatic deposit to the LEFT lower lobe. 4. Ill-defined hypermetabolic soft tissue along the LEFT operator space is most consistent with local recurrence of endometrial carcinoma. There is entrapment of the LEFT ureter by this hypermetabolic soft tissue with mild hydroureter and hydronephrosis on the LEFT. 5. Additionally there appears to be direct extension from the hypermetabolic left operator space soft tissue to the sigmoid colon with there is a  well-defined hypermetabolic lesion in the colon. 6. More distant hypermetabolic peritoneal metastasis along the ventral peritoneal surface of the RIGHT abdomen beneath RIGHT rectus abdominus muscle. 7. Hypermetabolic lymph nodes in the porta hepatis are favored related to sarcoidosis.   05/19/2020 Cancer Staging   Staging form: Corpus Uteri - Carcinoma and Carcinosarcoma, AJCC 8th Edition - Clinical stage from 05/19/2020: Stage IVB (rcT1, cN2a, cM1) - Signed by [name], MD on 05/19/2020   05/26/2020 Procedure   Successful placement of a right internal jugular approach power injectable Port-A-Cath. The catheter is ready for immediate use   05/29/2020 - 09/27/2020 Chemotherapy   The patient had carboplatin and taxol for chemotherapy treatment.     07/31/2020 Imaging   1. Asymmetric soft tissue mass extending from the left vaginal cuff has decreased in size in the interval. No residual peritoneal nodularity. Pulmonary metastases are stable with exception of a minimally improved left lower lobe nodule. 2. Stable soft tissue nodule associated with the right rectus abdominus muscle. 3. Interval resolution of left hydronephrosis. 4. Calcified mediastinal and hilar lymph nodes, together with pulmonary parenchymal fibrosis, findings which are in keeping with patient's known diagnosis of sarcoid. 5. Aortic atherosclerosis (ICD10-I70.0). Coronary artery calcification.   10/27/2020 Imaging   1. Essentially stable appearance of the soft tissue density nodule posteriorly in the left rectus abdominus muscle, currently 1.8 by 1.2 cm, previously 11 mm. 2. Stable calcified adenopathy in the mediastinum and hila, compatible with prior granulomatous disease. 3. Stable small right lower lobe pulmonary nodules. 4. Stable soft tissue density prominence along the left vaginal cuff extending up towards the adnexa, probably therapy related. 5. Other imaging findings of potential clinical significance: Coronary  atherosclerosis. Small type 1 hiatal hernia. Stable subpleural reticulation favoring  the lung bases, possibly from mild fibrosis. 6. Aortic atherosclerosis.   01/19/2021 Imaging   1. Status post hysterectomy. Interval enlargement of soft tissue adjacent to the left aspect of the vaginal cuff and bladder, measuring approximately 4.3 x 2.9 cm in axial section, previously previously previously 3.7 x 1.8 cm when measured similarly. Extensive soft tissue thickening and stranding throughout the low pelvis. 2. Interval enlargement of a metastatic nodule within the right rectus abdominus. New small peritoneal nodule anterior to the sigmoid colon. 3. Interval enlargement of retroperitoneal retroperitoneal lymph nodes. 4. Multiple small bilateral pulmonary nodules, several of which are new and enlarged. 5. Constellation of findings above is consistent with worsened metastatic disease. 6. New left hydronephrosis and hydroureter, the distal left ureter obstructed by soft tissue near the left aspect of the urinary bladder and vaginal cuff. 7. Unchanged enlarged, partially calcified mediastinal and hilar lymph nodes,, in keeping with prior granulomatous infection or sarcoidosis. 8. Mild underlying pulmonary fibrosis, in a UIP pattern. 9. Coronary artery disease.   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     02/02/2021 -  Chemotherapy    Patient is on Treatment Plan: OVARIAN CARBOPLATIN / DOCETAXEL Q21D        REVIEW OF SYSTEMS:   Constitutional: Denies fevers, chills or abnormal weight loss Eyes: Denies blurriness of vision Ears, nose, mouth, throat, and face: Denies mucositis or sore throat Respiratory: Denies cough, dyspnea or wheezes Cardiovascular: Denies palpitation, chest discomfort or lower extremity swelling Gastrointestinal:  Denies nausea, heartburn or change in bowel habits Skin: Denies abnormal skin rashes Lymphatics: Denies new lymphadenopathy or easy bruising Behavioral/Psych: Mood is stable, no new changes  All other systems were reviewed with the patient and are negative.  I have reviewed the past medical history, past surgical history, social history and family history with the patient and they are unchanged from previous note.  ALLERGIES:  is allergic to shellfish allergy and penicillins.  MEDICATIONS:  Current Outpatient Medications  Medication Sig Dispense Refill  . albuterol (VENTOLIN HFA) 108 (90 Base) MCG/ACT inhaler INHALE 2 PUFFS INTO THE LUNGS EVERY 6 HOURS AS NEEDED FOR WHEEZING OR SHORTNESS OF BREATH (NEED MD APPOINTMENT) 3 each 0  . bisoprolol (ZEBETA) 10 MG tablet Take 1 tablet (10 mg total) by mouth in the morning and at bedtime. 180 tablet 0  . budesonide-formoterol (SYMBICORT) 80-4.5 MCG/ACT inhaler Inhale 2 puffs into the lungs 2 (two) times daily. 3 Inhaler 3  . desonide (DESOWEN) 0.05 % lotion Apply topically 2 (two) times daily. Continue following with dermatologist 59 mL 0  . dexamethasone (DECADRON) 4 MG tablet TAKE 2 TABLETS BY MOUTH 2 TIMES DAILY THE DAY BEFORE TAXOTERE THEN ONCE DAILY FOR 3 DAYS AFTER CARBOPLATIN CHEMO 30 tablet 1  . dextromethorphan (DELSYM) 30 MG/5ML liquid Take by mouth as  needed for cough.     . hydrochlorothiazide (HYDRODIURIL) 25 MG tablet Take 25 mg by mouth  daily.    . hydrocortisone 2.5 % cream Apply 1 application topically 2 (two) times daily.    Marland Kitchen loratadine (CLARITIN) 10 MG tablet Take 10 mg by mouth daily as needed for allergies.    Marland Kitchen morphine (MSIR) 15 MG tablet TAKE 1 TABLET BY MOUTH EVERY 4 HOURS AS NEEDED FOR SEVERE PAIN 90 tablet 0  . ondansetron (ZOFRAN) 8 MG tablet Take 1 tablet (8 mg total) by mouth every 8 (eight) hours as needed for nausea. 30 tablet 3  . pantoprazole (PROTONIX) 40 MG tablet Take 1 tablet (40 mg total) by mouth daily. Patient needs appt for further refills. 90 tablet 0  . polyethylene glycol (MIRALAX / GLYCOLAX) 17 g packet Take 17 g by mouth daily.    . predniSONE (DELTASONE) 10 MG tablet Take 0.5 tablets (5 mg total) by mouth daily with breakfast. Patient needs appt for further refills 90 tablet 0  . prochlorperazine (COMPAZINE) 10 MG tablet TAKE 1 TABLET BY MOUTH EVERY 6 HOURS AS NEEDED FOR NAUSEA OR VOMITING 60 tablet 3  . RABEprazole (ACIPHEX) 20 MG tablet Take 1 tablet (20 mg total) by mouth daily before breakfast. 90 tablet 3  . spironolactone (ALDACTONE) 25 MG tablet TAKE 1 TABLET BY MOUTH DAILY. 30 tablet 1  . vitamin C (ASCORBIC ACID) 500 MG tablet Take 500 mg by mouth daily.     No current facility-administered medications for this visit.    PHYSICAL EXAMINATION: ECOG PERFORMANCE STATUS: 1 - Symptomatic but completely ambulatory  Vitals:   03/20/21 1137 03/20/21 1140  BP: (!) 181/94 (!) 178/86  Pulse: 81   Resp: 19   Temp: 97.9 F (36.6 C)   SpO2: 98%    Filed Weights   03/20/21 1137  Weight: 217 lb 9.6 oz (98.7 kg)    GENERAL:alert, no distress and comfortable SKIN: skin color, texture, turgor are normal, no rashes or significant lesions EYES: normal, Conjunctiva are pink and non-injected, sclera clear OROPHARYNX:no exudate, no erythema and lips, buccal mucosa, and tongue normal  NECK: supple, thyroid normal size, non-tender, without nodularity LYMPH:  no palpable lymphadenopathy in the  cervical, axillary or inguinal LUNGS: clear to auscultation and percussion with normal breathing effort HEART: regular rate & rhythm and no murmurs with mild bilateral lower extremity edema ABDOMEN:abdomen soft, non-tender and normal bowel sounds Musculoskeletal:no cyanosis of digits and no clubbing  NEURO: alert & oriented x 3 with fluent speech, no focal motor/sensory deficits  LABORATORY DATA:  I have reviewed the data as listed    Component Value Date/Time   NA 140 03/20/2021 1130   K 4.4 03/20/2021 1130   CL 104 03/20/2021 1130   CO2 21 (L) 03/20/2021 1130   GLUCOSE 162 (H) 03/20/2021 1130   BUN 23 03/20/2021 1130   CREATININE 0.95 03/20/2021 1130   CALCIUM 9.4 03/20/2021 1130   PROT 8.3 (H) 03/20/2021 1130   ALBUMIN 3.9 03/20/2021 1130   AST 15 03/20/2021 1130   ALT 24 03/20/2021 1130   ALKPHOS 109 03/20/2021 1130   BILITOT 0.4 03/20/2021 1130   GFRNONAA >60 03/20/2021 1130   GFRAA >60 09/08/2020 0819    No results found for: SPEP, UPEP  Lab Results  Component Value Date   WBC 9.2 03/20/2021   NEUTROABS 7.9 (H) 03/20/2021   HGB 8.6 (L) 03/20/2021   HCT 24.7 (L) 03/20/2021   MCV 96.1 03/20/2021  PLT 135 (L) 03/20/2021      Chemistry      Component Value Date/Time   NA 140 03/20/2021 1130   K 4.4 03/20/2021 1130   CL 104 03/20/2021 1130   CO2 21 (L) 03/20/2021 1130   BUN 23 03/20/2021 1130   CREATININE 0.95 03/20/2021 1130      Component Value Date/Time   CALCIUM 9.4 03/20/2021 1130   ALKPHOS 109 03/20/2021 1130   AST 15 03/20/2021 1130   ALT 24 03/20/2021 1130   BILITOT 0.4 03/20/2021 1130

## 2021-03-20 NOTE — Assessment & Plan Note (Signed)
Overall, she appears to be responding to treatment clinically She has no further abdominal pain and has not taken any pain medicine recently She denies worsening peripheral neuropathy We will continue treatment as scheduled

## 2021-03-22 ENCOUNTER — Telehealth: Payer: Self-pay

## 2021-03-22 NOTE — Telephone Encounter (Signed)
She called and left a message. She is planning a trip on 5/31 for 1 week gong out of town.  She is asking for Dr. Alvy Bimler thoughts on vacation. Is it okay to start planning her trip.

## 2021-03-22 NOTE — Telephone Encounter (Signed)
Called and given below message. She verbalized understanding. 

## 2021-03-22 NOTE — Telephone Encounter (Signed)
She will get CT scan checked early May probably We can work around her schedule

## 2021-03-23 ENCOUNTER — Other Ambulatory Visit (HOSPITAL_COMMUNITY): Payer: Self-pay

## 2021-03-23 MED FILL — Spironolactone Tab 25 MG: ORAL | 30 days supply | Qty: 30 | Fill #0 | Status: AC

## 2021-04-10 ENCOUNTER — Other Ambulatory Visit: Payer: Self-pay

## 2021-04-10 ENCOUNTER — Other Ambulatory Visit: Payer: Medicare HMO

## 2021-04-10 ENCOUNTER — Inpatient Hospital Stay: Payer: Medicare HMO

## 2021-04-10 ENCOUNTER — Inpatient Hospital Stay: Payer: Medicare HMO | Admitting: Hematology and Oncology

## 2021-04-10 ENCOUNTER — Other Ambulatory Visit: Payer: Self-pay | Admitting: Hematology and Oncology

## 2021-04-10 ENCOUNTER — Encounter: Payer: Self-pay | Admitting: Hematology and Oncology

## 2021-04-10 VITALS — BP 180/87 | HR 63 | Temp 97.7°F | Resp 18

## 2021-04-10 VITALS — BP 162/80 | HR 87 | Temp 97.9°F | Resp 18 | Ht 65.5 in | Wt 219.4 lb

## 2021-04-10 DIAGNOSIS — T451X5A Adverse effect of antineoplastic and immunosuppressive drugs, initial encounter: Secondary | ICD-10-CM

## 2021-04-10 DIAGNOSIS — C7802 Secondary malignant neoplasm of left lung: Secondary | ICD-10-CM | POA: Diagnosis not present

## 2021-04-10 DIAGNOSIS — R911 Solitary pulmonary nodule: Secondary | ICD-10-CM | POA: Diagnosis not present

## 2021-04-10 DIAGNOSIS — C541 Malignant neoplasm of endometrium: Secondary | ICD-10-CM

## 2021-04-10 DIAGNOSIS — D6481 Anemia due to antineoplastic chemotherapy: Secondary | ICD-10-CM

## 2021-04-10 DIAGNOSIS — I10 Essential (primary) hypertension: Secondary | ICD-10-CM | POA: Diagnosis not present

## 2021-04-10 DIAGNOSIS — Z5111 Encounter for antineoplastic chemotherapy: Secondary | ICD-10-CM | POA: Diagnosis not present

## 2021-04-10 DIAGNOSIS — D61818 Other pancytopenia: Secondary | ICD-10-CM

## 2021-04-10 DIAGNOSIS — C775 Secondary and unspecified malignant neoplasm of intrapelvic lymph nodes: Secondary | ICD-10-CM | POA: Diagnosis not present

## 2021-04-10 DIAGNOSIS — G620 Drug-induced polyneuropathy: Secondary | ICD-10-CM

## 2021-04-10 LAB — CBC WITH DIFFERENTIAL (CANCER CENTER ONLY)
Abs Immature Granulocytes: 0.12 10*3/uL — ABNORMAL HIGH (ref 0.00–0.07)
Basophils Absolute: 0 10*3/uL (ref 0.0–0.1)
Basophils Relative: 0 %
Eosinophils Absolute: 0 10*3/uL (ref 0.0–0.5)
Eosinophils Relative: 0 %
HCT: 23 % — ABNORMAL LOW (ref 36.0–46.0)
Hemoglobin: 7.6 g/dL — ABNORMAL LOW (ref 12.0–15.0)
Immature Granulocytes: 2 %
Lymphocytes Relative: 11 %
Lymphs Abs: 0.9 10*3/uL (ref 0.7–4.0)
MCH: 33.8 pg (ref 26.0–34.0)
MCHC: 33 g/dL (ref 30.0–36.0)
MCV: 102.2 fL — ABNORMAL HIGH (ref 80.0–100.0)
Monocytes Absolute: 0.6 10*3/uL (ref 0.1–1.0)
Monocytes Relative: 8 %
Neutro Abs: 6.1 10*3/uL (ref 1.7–7.7)
Neutrophils Relative %: 79 %
Platelet Count: 116 10*3/uL — ABNORMAL LOW (ref 150–400)
RBC: 2.25 MIL/uL — ABNORMAL LOW (ref 3.87–5.11)
RDW: 20.1 % — ABNORMAL HIGH (ref 11.5–15.5)
WBC Count: 7.7 10*3/uL (ref 4.0–10.5)
nRBC: 0.6 % — ABNORMAL HIGH (ref 0.0–0.2)

## 2021-04-10 LAB — CMP (CANCER CENTER ONLY)
ALT: 14 U/L (ref 0–44)
AST: 13 U/L — ABNORMAL LOW (ref 15–41)
Albumin: 3.7 g/dL (ref 3.5–5.0)
Alkaline Phosphatase: 103 U/L (ref 38–126)
Anion gap: 11 (ref 5–15)
BUN: 24 mg/dL — ABNORMAL HIGH (ref 8–23)
CO2: 24 mmol/L (ref 22–32)
Calcium: 9.7 mg/dL (ref 8.9–10.3)
Chloride: 107 mmol/L (ref 98–111)
Creatinine: 1.14 mg/dL — ABNORMAL HIGH (ref 0.44–1.00)
GFR, Estimated: 54 mL/min — ABNORMAL LOW (ref >60)
Glucose, Bld: 154 mg/dL — ABNORMAL HIGH (ref 70–99)
Potassium: 4.1 mmol/L (ref 3.5–5.1)
Sodium: 142 mmol/L (ref 135–145)
Total Bilirubin: 0.4 mg/dL (ref 0.3–1.2)
Total Protein: 7.9 g/dL (ref 6.5–8.1)

## 2021-04-10 LAB — PREPARE RBC (CROSSMATCH)

## 2021-04-10 MED ORDER — SODIUM CHLORIDE 0.9% FLUSH
10.0000 mL | Freq: Once | INTRAVENOUS | Status: AC
  Administered 2021-04-10: 10 mL
  Filled 2021-04-10: qty 10

## 2021-04-10 MED ORDER — CLONIDINE HCL 0.1 MG PO TABS
0.1000 mg | ORAL_TABLET | Freq: Once | ORAL | Status: AC
  Administered 2021-04-10: 0.1 mg via ORAL

## 2021-04-10 MED ORDER — ACETAMINOPHEN 325 MG PO TABS
ORAL_TABLET | ORAL | Status: AC
  Filled 2021-04-10: qty 2

## 2021-04-10 MED ORDER — DIPHENHYDRAMINE HCL 25 MG PO CAPS
25.0000 mg | ORAL_CAPSULE | Freq: Once | ORAL | Status: AC
  Administered 2021-04-10: 25 mg via ORAL

## 2021-04-10 MED ORDER — CLONIDINE HCL 0.1 MG PO TABS
ORAL_TABLET | ORAL | Status: AC
  Filled 2021-04-10: qty 1

## 2021-04-10 MED ORDER — DIPHENHYDRAMINE HCL 50 MG/ML IJ SOLN
25.0000 mg | Freq: Once | INTRAMUSCULAR | Status: AC
  Administered 2021-04-10: 25 mg via INTRAVENOUS

## 2021-04-10 MED ORDER — PALONOSETRON HCL INJECTION 0.25 MG/5ML
INTRAVENOUS | Status: AC
  Filled 2021-04-10: qty 5

## 2021-04-10 MED ORDER — SODIUM CHLORIDE 0.9 % IV SOLN
10.0000 mg | Freq: Once | INTRAVENOUS | Status: AC
  Administered 2021-04-10: 10 mg via INTRAVENOUS
  Filled 2021-04-10: qty 10

## 2021-04-10 MED ORDER — FUROSEMIDE 10 MG/ML IJ SOLN
10.0000 mg | Freq: Once | INTRAMUSCULAR | Status: AC
  Administered 2021-04-10: 10 mg via INTRAVENOUS

## 2021-04-10 MED ORDER — DIPHENHYDRAMINE HCL 50 MG/ML IJ SOLN
INTRAMUSCULAR | Status: AC
  Filled 2021-04-10: qty 1

## 2021-04-10 MED ORDER — FUROSEMIDE 10 MG/ML IJ SOLN
INTRAMUSCULAR | Status: AC
  Filled 2021-04-10: qty 2

## 2021-04-10 MED ORDER — SODIUM CHLORIDE 0.9 % IV SOLN
Freq: Once | INTRAVENOUS | Status: AC
  Filled 2021-04-10: qty 250

## 2021-04-10 MED ORDER — FAMOTIDINE IN NACL 20-0.9 MG/50ML-% IV SOLN
INTRAVENOUS | Status: AC
  Filled 2021-04-10: qty 50

## 2021-04-10 MED ORDER — SODIUM CHLORIDE 0.9% FLUSH
10.0000 mL | INTRAVENOUS | Status: DC | PRN
  Administered 2021-04-10: 10 mL
  Filled 2021-04-10: qty 10

## 2021-04-10 MED ORDER — FAMOTIDINE IN NACL 20-0.9 MG/50ML-% IV SOLN
20.0000 mg | Freq: Once | INTRAVENOUS | Status: AC
  Administered 2021-04-10: 20 mg via INTRAVENOUS

## 2021-04-10 MED ORDER — SODIUM CHLORIDE 0.9% IV SOLUTION
250.0000 mL | Freq: Once | INTRAVENOUS | Status: DC
  Filled 2021-04-10: qty 250

## 2021-04-10 MED ORDER — PALONOSETRON HCL INJECTION 0.25 MG/5ML
0.2500 mg | Freq: Once | INTRAVENOUS | Status: AC
  Administered 2021-04-10: 0.25 mg via INTRAVENOUS

## 2021-04-10 MED ORDER — ACETAMINOPHEN 325 MG PO TABS
650.0000 mg | ORAL_TABLET | Freq: Once | ORAL | Status: AC
  Administered 2021-04-10: 650 mg via ORAL

## 2021-04-10 MED ORDER — SODIUM CHLORIDE 0.9 % IV SOLN
60.0000 mg/m2 | Freq: Once | INTRAVENOUS | Status: AC
  Administered 2021-04-10: 130 mg via INTRAVENOUS
  Filled 2021-04-10: qty 13

## 2021-04-10 MED ORDER — SODIUM CHLORIDE 0.9 % IV SOLN
150.0000 mg | Freq: Once | INTRAVENOUS | Status: AC
  Administered 2021-04-10: 150 mg via INTRAVENOUS
  Filled 2021-04-10: qty 150

## 2021-04-10 MED ORDER — DIPHENHYDRAMINE HCL 25 MG PO CAPS
ORAL_CAPSULE | ORAL | Status: AC
  Filled 2021-04-10: qty 1

## 2021-04-10 MED ORDER — SODIUM CHLORIDE 0.9 % IV SOLN
517.5000 mg | Freq: Once | INTRAVENOUS | Status: AC
  Administered 2021-04-10: 520 mg via INTRAVENOUS
  Filled 2021-04-10: qty 52

## 2021-04-10 MED ORDER — HEPARIN SOD (PORK) LOCK FLUSH 100 UNIT/ML IV SOLN
500.0000 [IU] | Freq: Once | INTRAVENOUS | Status: AC | PRN
  Administered 2021-04-10: 500 [IU]
  Filled 2021-04-10: qty 5

## 2021-04-10 NOTE — Patient Instructions (Signed)
[facility] ONCOLOGY  Discharge Instructions: Thank you for choosing [facility] to provide your oncology and hematology care.   If you have a lab appointment with the [facility], please go directly to the [facility] and check in at the registration area.   Wear comfortable clothing and clothing appropriate for easy access to any Portacath or PICC line.   We strive to give you quality time with your provider. You may need to reschedule your appointment if you arrive late (15 or more minutes).  Arriving late affects you and other patients whose appointments are after yours.  Also, if you miss three or more appointments without notifying the office, you may be dismissed from the clinic at the provider's discretion.      For prescription refill requests, have your pharmacy contact our office and allow 72 hours for refills to be completed.    Today you received the following chemotherapy and/or immunotherapy agents: Taxol, carboplatin     To help prevent nausea and vomiting after your treatment, we encourage you to take your nausea medication as directed.  BELOW ARE SYMPTOMS THAT SHOULD BE REPORTED IMMEDIATELY: . *FEVER GREATER THAN 100.4 F (38 C) OR HIGHER . *CHILLS OR SWEATING . *NAUSEA AND VOMITING THAT IS NOT CONTROLLED WITH YOUR NAUSEA MEDICATION . *UNUSUAL SHORTNESS OF BREATH . *UNUSUAL BRUISING OR BLEEDING . *URINARY PROBLEMS (pain or burning when urinating, or frequent urination) . *BOWEL PROBLEMS (unusual diarrhea, constipation, pain near the anus) . TENDERNESS IN MOUTH AND THROAT WITH OR WITHOUT PRESENCE OF ULCERS (sore throat, sores in mouth, or a toothache) . UNUSUAL RASH, SWELLING OR PAIN  . UNUSUAL VAGINAL DISCHARGE OR ITCHING   Items with * indicate a potential emergency and should be followed up as soon as possible or go to the Emergency Department if any problems should occur.  Please show the CHEMOTHERAPY ALERT CARD or  IMMUNOTHERAPY ALERT CARD at check-in to the Emergency Department and triage nurse.  Should you have questions after your visit or need to cancel or reschedule your appointment, please contact [facility]  Dept: [phone]  and follow the prompts.  Office hours are 8:00 a.m. to 4:30 p.m. Monday - Friday. Please note that voicemails left after 4:00 p.m. may not be returned until the following business day.  We are closed weekends and major holidays. You have access to a nurse at all times for urgent questions. Please call the main number to the clinic Dept: [phone] and follow the prompts.   For any non-urgent questions, you may also contact your provider using MyChart. We now offer e-Visits for anyone 37 and older to request care online for non-urgent symptoms. For details visit [email or web address].   Also download the MyChart app! Go to the app store, search "MyChart", open the app, select [facility], and log in with your MyChart username and password.  Due to Covid, a mask is required upon entering the hospital/clinic. If you do not have a mask, one will be given to you upon arrival. For doctor visits, patients may have 1 support person aged 58 or older with them. For treatment visits, patients cannot have anyone with them due to current Covid guidelines and our immunocompromised population.    Blood Transfusion, Adult, Care After This sheet gives you information about how to care for yourself after your procedure. Your doctor may also give you more specific instructions. If you have problems or questions, contact your doctor. What  can I expect after the procedure? After the procedure, it is common to have:  Bruising and soreness at the IV site.  A fever or chills on the day of the procedure. This may be your body's response to the new blood cells received.  A headache. Follow these instructions at home: Insertion site care  Follow instructions from  your doctor about how to take care of your insertion site. This is where an IV tube was put into your vein. Make sure you: ? Wash your hands with soap and water before and after you change your bandage (dressing). If you cannot use soap and water, use hand sanitizer. ? Change your bandage as told by your doctor.  Check your insertion site every day for signs of infection. Check for: ? Redness, swelling, or pain. ? Bleeding from the site. ? Warmth. ? Pus or a bad smell.      General instructions  Take over-the-counter and prescription medicines only as told by your doctor.  Rest as told by your doctor.  Go back to your normal activities as told by your doctor.  Keep all follow-up visits as told by your doctor. This is important. Contact a doctor if:  You have itching or red, swollen areas of skin (hives).  You feel worried or nervous (anxious).  You feel weak after doing your normal activities.  You have redness, swelling, warmth, or pain around the insertion site.  You have blood coming from the insertion site, and the blood does not stop with pressure.  You have pus or a bad smell coming from the insertion site. Get help right away if:  You have signs of a serious reaction. This may be coming from an allergy or the body's defense system (immune system). Signs include: ? Trouble breathing or shortness of breath. ? Swelling of the face or feeling warm (flushed). ? Fever or chills. ? Head, chest, or back pain. ? Dark pee (urine) or blood in the pee. ? Widespread rash. ? Fast heartbeat. ? Feeling dizzy or light-headed. You may receive your blood transfusion in an outpatient setting. If so, you will be told whom to contact to report any reactions. These symptoms may be an emergency. Do not wait to see if the symptoms will go away. Get medical help right away. Call your local emergency services (911 in the [place] Do not drive yourself to the hospital. Summary  Bruising and  soreness at the IV site are common.  Check your insertion site every day for signs of infection.  Rest as told by your doctor. Go back to your normal activities as told by your doctor.  Get help right away if you have signs of a serious reaction. This information is not intended to replace advice given to you by your health care provider. Make sure you discuss any questions you have with your health care provider. Document Revised: 05/27/2019 Document Reviewed: 05/27/2019 Elsevier Patient Education  [place].

## 2021-04-10 NOTE — Assessment & Plan Note (Signed)
I have reviewed the plan of care with the patient and family extensively Clinically, she appears to be responding well to treatment Her treatment course is complicated by mild neuropathy and severe anemia requiring transfusion support We will proceed with treatment along with 1 unit of blood transfusion I plan to order CT imaging next week for objective assessment of response to treatment

## 2021-04-10 NOTE — Assessment & Plan Note (Signed)
She has severe pancytopenia due to treatment Previously, I have ordered vitamin B12 and iron studies and they were adequate The most likely cause is due to chemotherapy  We discussed some of the risks, benefits, and alternatives of blood transfusions. The patient is symptomatic from anemia and the hemoglobin level is critically low.  Some of the side-effects to be expected including risks of transfusion reactions, chills, infection, syndrome of volume overload and risk of hospitalization from various reasons and the patient is willing to proceed and went ahead to sign consent today.

## 2021-04-10 NOTE — Progress Notes (Signed)
Per Dr. Alvy Bimler, okay to proceed with treatment today with hemoglobin 7.6. Will order unit of blood for later.

## 2021-04-10 NOTE — Assessment & Plan Note (Signed)
Her documented blood pressure at home is improved I recommend resumption of hydrochlorothiazide I have asked her to continue blood pressure monitoring twice a day 

## 2021-04-10 NOTE — Progress Notes (Signed)
[place] OFFICE PROGRESS NOTE  Patient Care Team: [name], [name], MD as PCP - General (Family Medicine)  ASSESSMENT & PLAN:  Endometrial cancer [facility]) I have reviewed the plan of care with the patient and family extensively Clinically, she appears to be responding well to treatment Her treatment course is complicated by mild neuropathy and severe anemia requiring transfusion support We will proceed with treatment along with 1 unit of blood transfusion I plan to order CT imaging next week for objective assessment of response to treatment  Pancytopenia, acquired [facility]) She has severe pancytopenia due to treatment Previously, I have ordered vitamin B12 and iron studies and they were adequate The most likely cause is due to chemotherapy  We discussed some of the risks, benefits, and alternatives of blood transfusions. The patient is symptomatic from anemia and the hemoglobin level is critically low.  Some of the side-effects to be expected including risks of transfusion reactions, chills, infection, syndrome of volume overload and risk of hospitalization from various reasons and the patient is willing to proceed and went ahead to sign consent today.   Lung nodule seen on imaging study She has lung nodules and lymphadenopathy seen on prior CT imaging of the chest I will order repeat CT imaging of the chest for assessment  Essential hypertension Her documented blood pressure at home is improved I recommend resumption of hydrochlorothiazide I have asked her to continue blood pressure monitoring twice a day  Peripheral neuropathy due to chemotherapy [facility]) She denies worsening peripheral neuropathy from treatment.  Observe closely for now   Orders Placed This Encounter  Procedures  . CT CHEST ABDOMEN PELVIS W CONTRAST    Standing Status:   Future    Standing Expiration Date:   04/10/2022    Order Specific Question:   Preferred imaging location?    Answer:   [facility]    Order Specific Question:   Radiology Contrast Protocol - do NOT remove file path    Answer:   [email or web address]  . Informed Consent Details: Physician/Practitioner Attestation; Transcribe to consent form and obtain patient signature    Standing Status:   Future    Standing Expiration Date:   04/10/2022    Order Specific Question:   Physician/Practitioner attestation of informed consent for blood and or blood product transfusion    Answer:   I, the physician/practitioner, attest that I have discussed with the patient the benefits, risks, side effects, alternatives, likelihood of achieving goals and potential problems during recovery for the procedure that I have provided informed consent.    Order Specific Question:   Product(s)    Answer:   All Product(s)  . Care order/instruction    Transfuse Parameters    Standing Status:   Future    Standing Expiration Date:   04/10/2022  . Type and screen    Standing Status:   Future    Number of Occurrences:   1    Standing Expiration Date:   04/10/2022  . Prepare RBC (crossmatch)    Standing Status:   Standing    Number of Occurrences:   1    Order Specific Question:   # of Units    Answer:   1 unit    Order Specific Question:   Transfusion Indications    Answer:   Symptomatic Anemia    Order Specific Question:   Special Requirements    Answer:   Irradiated    Order Specific Question:   Number  of Units to Keep Ahead    Answer:   NO units ahead    Order Specific Question:   Instructions:    Answer:   Transfuse    Order Specific Question:   If emergent release call blood bank    Answer:   Not emergent release  . Sample to Blood Bank    Standing Status:   Standing    Number of Occurrences:   33    Standing Expiration Date:   04/10/2022    All questions were answered. The patient knows to call the clinic with any problems, questions or concerns. The total time spent in the appointment was 30  minutes encounter with patients including review of chart and various tests results, discussions about plan of care and coordination of care plan   [name], MD 04/10/2021 1:13 PM  INTERVAL HISTORY: Please see below for problem oriented charting. She returns for further follow-up She is doing well She denies pain No recent nausea or constipation Her neuropathy is stable The patient denies any recent signs or symptoms of bleeding such as spontaneous epistaxis, hematuria or hematochezia. She complain of occasional shortness of breath on exertion  SUMMARY OF ONCOLOGIC HISTORY: Oncology History Overview Note  ER/PR neg, MSI stable   Endometrial cancer ([place])  06/15/2018 Surgery   Surgeon: Donaciano Eva     Operation: Robotic-assisted laparoscopic total hysterectomy with bilateral salpingoophorectomy, SLN biopsy     Operative Findings:  : 6cm uterus, normal tubes and ovaries, omentum adherent to the anterior abdominal wall and uterine fundus. No suspicious nodes.   06/15/2018 Pathology Results   Procedure: Total hysterectomy with bilateral salpingo-oophorectomy. Bilateral obturator sentinel lymph node biopsies. Histologic type: Endometrioid adenocarcinoma. Histologic Grade: FIGO grade 1. Myometrial invasion: Depth of invasion: 15 mm Myometrial thickness: 20 mm Uterine Serosa Involvement: Not identified. Cervical stromal involvement: Not identified. Extent of involvement of other organs: Uninvolved. Lymphovascular invasion: Present. Regional Lymph Nodes: Examined: 2 Sentinel 0 Non-sentinel 2 Total Lymph nodes with metastasis: 0 Isolated tumor cells (< 0.2 mm): 2 Micrometastasis: (> 0.2 mm and < 2.0 mm): 0 Macrometastasis: (> 2.0 mm): 0 Extracapsular extension: N/A. Tumor block for ancillary studies: 4Q-S. MMR / MSI testing: Will be ordered. Pathologic Stage Classification (pTNM, AJCC 8th edition): pT1b, pN0(i+)   07/20/2018 Imaging   1. Surgical changes from recent  hysterectomy. No findings suspicious for residual tumor or adenopathy in the pelvis. 2. No CT findings to suggest omental, peritoneal surface or solid organ metastatic disease in the abdomen. 3. Changes consistent with known sarcoidosis in the chest with bulky calcified mediastinal and hilar lymph nodes and interstitial lung disease. I do not see any definite CT findings to suggest metastatic disease involving the chest.   08/19/2018 - 09/17/2018 Radiation Therapy   08/19/18 - 09/17/18: Vaginal Cuff, 3 cm cylinder with treatment length of 3 cm / 30 Gy delivered in 5 fractions of 6 Gy, Brachytherapy / HDR, Iridium-192   05/2005: Left Chest Wall keloid   04/2004 - 05/2004: Anus, pelvis /inguinal ( Dr. Tammi Klippel)   01/19/2020 Imaging   1. No definite CT findings of the abdomen or pelvis to explain pain. 2. The sigmoid colon and rectum are decompressed although slightly thickened appearing, suggestive of nonspecific infectious, inflammatory, or ischemic colitis. Correlate for referable clinical symptoms. 3. There is trace, nonspecific free fluid in the low pelvis, which may be reactive, although would be difficult to distinguish from postoperative and post treatment soft tissue change in the pelvis given  history of hysterectomy and rectal cancer. 4. Unchanged prominent retroperitoneal and iliac lymph nodes. No new findings suspicious for metastatic disease in the abdomen or pelvis. 5. Bibasilar pulmonary fibrosis and mediastinal and hilar lymphadenopathy, better evaluated by prior CT chest and consistent with patient diagnosis of sarcoidosis.       04/18/2020 Imaging   Venous Doppler Left:  - No evidence of deep vein thrombosis seen in the left lower extremity, from the common femoral through the popliteal veins.  - No evidence of superficial venous thrombosis in the left lower extremity.  - There is no evidence of venous reflux seen in the left lower extremity.  - Rouleax flow noted in the superficial  system.    05/18/2020 PET scan   1. Complex scan with a combination of hypermetabolic granulomatous disease (sarcoidosis) as well as hypermetabolic metastatic endometrial carcinoma. 2. Hypermetabolic mediastinal hilar lymph nodes and pulmonary nodularity with bronchovascular thickening is consistent with sarcoidosis. 3. Solitary hypermetabolic nodule in the medial LEFT lower lobe, new from prior with intense metabolic activity, is concerning for a metastatic deposit to the LEFT lower lobe. 4. Ill-defined hypermetabolic soft tissue along the LEFT operator space is most consistent with local recurrence of endometrial carcinoma. There is entrapment of the LEFT ureter by this hypermetabolic soft tissue with mild hydroureter and hydronephrosis on the LEFT. 5. Additionally there appears to be direct extension from the hypermetabolic left operator space soft tissue to the sigmoid colon with there is a well-defined hypermetabolic lesion in the colon. 6. More distant hypermetabolic peritoneal metastasis along the ventral peritoneal surface of the RIGHT abdomen beneath RIGHT rectus abdominus muscle. 7. Hypermetabolic lymph nodes in the porta hepatis are favored related to sarcoidosis.   05/19/2020 Cancer Staging   Staging form: Corpus Uteri - Carcinoma and Carcinosarcoma, AJCC 8th Edition - Clinical stage from 05/19/2020: Stage IVB (rcT1, cN2a, cM1) - Signed by [name], MD on 05/19/2020   05/26/2020 Procedure   Successful placement of a right internal jugular approach power injectable Port-A-Cath. The catheter is ready for immediate use   05/29/2020 - 09/27/2020 Chemotherapy   The patient had carboplatin and taxol for chemotherapy treatment.     07/31/2020 Imaging   1. Asymmetric soft tissue mass extending from the left vaginal cuff has decreased in size in the interval. No residual peritoneal nodularity. Pulmonary metastases are stable with exception of a minimally improved left lower lobe nodule. 2. Stable  soft tissue nodule associated with the right rectus abdominus muscle. 3. Interval resolution of left hydronephrosis. 4. Calcified mediastinal and hilar lymph nodes, together with pulmonary parenchymal fibrosis, findings which are in keeping with patient's known diagnosis of sarcoid. 5. Aortic atherosclerosis (ICD10-I70.0). Coronary artery calcification.   10/27/2020 Imaging   1. Essentially stable appearance of the soft tissue density nodule posteriorly in the left rectus abdominus muscle, currently 1.8 by 1.2 cm, previously 11 mm. 2. Stable calcified adenopathy in the mediastinum and hila, compatible with prior granulomatous disease. 3. Stable small right lower lobe pulmonary nodules. 4. Stable soft tissue density prominence along the left vaginal cuff extending up towards the adnexa, probably therapy related. 5. Other imaging findings of potential clinical significance: Coronary atherosclerosis. Small type 1 hiatal hernia. Stable subpleural reticulation favoring the lung bases, possibly from mild fibrosis. 6. Aortic atherosclerosis.   01/19/2021 Imaging   1. Status post hysterectomy. Interval enlargement of soft tissue adjacent to the left aspect of the vaginal cuff and bladder, measuring approximately 4.3 x 2.9 cm in axial section, previously  previously previously 3.7 x 1.8 cm when measured similarly. Extensive soft tissue thickening and stranding throughout the low pelvis. 2. Interval enlargement of a metastatic nodule within the right rectus abdominus. New small peritoneal nodule anterior to the sigmoid colon. 3. Interval enlargement of retroperitoneal retroperitoneal lymph nodes. 4. Multiple small bilateral pulmonary nodules, several of which are new and enlarged. 5. Constellation of findings above is consistent with worsened metastatic disease. 6. New left hydronephrosis and hydroureter, the distal left ureter obstructed by soft tissue near the left aspect of the urinary bladder and vaginal  cuff. 7. Unchanged enlarged, partially calcified mediastinal and hilar lymph nodes,, in keeping with prior granulomatous infection or sarcoidosis. 8. Mild underlying pulmonary fibrosis, in a UIP pattern. 9. Coronary artery disease.   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     02/02/2021 -  Chemotherapy    Patient is on Treatment Plan: OVARIAN CARBOPLATIN / DOCETAXEL Q21D        REVIEW OF SYSTEMS:   Constitutional: Denies fevers, chills or abnormal weight loss Eyes: Denies blurriness of vision Ears, nose, mouth, throat, and face: Denies mucositis or sore throat Cardiovascular: Denies palpitation, chest discomfort  Gastrointestinal:  Denies nausea, heartburn or change in bowel habits Skin: Denies abnormal skin rashes Lymphatics: Denies new lymphadenopathy or easy bruising Behavioral/Psych: Mood is stable, no new changes  All other systems were reviewed with the patient and are negative.  I have reviewed the past medical history, past surgical history, social history and family history with the patient and they are unchanged from previous note.  ALLERGIES:  is allergic to shellfish allergy and penicillins.  MEDICATIONS:  Current Outpatient Medications  Medication Sig Dispense Refill  . albuterol (VENTOLIN HFA) 108 (90  Base) MCG/ACT inhaler INHALE 2 PUFFS INTO THE LUNGS EVERY 6 HOURS AS NEEDED FOR WHEEZING OR SHORTNESS OF BREATH (NEED MD APPOINTMENT) 3 each 0  . bisoprolol (ZEBETA) 10 MG tablet Take 1 tablet (10 mg total) by mouth in the morning and at bedtime. 180 tablet 0  . budesonide-formoterol (SYMBICORT) 80-4.5 MCG/ACT inhaler Inhale 2 puffs into the lungs 2 (two) times daily. 3 Inhaler 3  . desonide (DESOWEN) 0.05 % lotion Apply topically 2 (two) times daily. Continue following with dermatologist 59 mL 0  . dexamethasone (DECADRON) 4 MG tablet TAKE 2 TABLETS BY MOUTH 2 TIMES DAILY THE DAY BEFORE TAXOTERE THEN ONCE DAILY FOR 3 DAYS AFTER CARBOPLATIN CHEMO 30 tablet 1  . dextromethorphan (DELSYM) 30 MG/5ML liquid Take by mouth as needed for cough.     . hydrochlorothiazide (HYDRODIURIL) 25 MG tablet Take 25 mg by mouth daily.    . hydrocortisone 2.5 % cream Apply 1 application topically 2 (two) times daily.    Marland Kitchen loratadine (CLARITIN) 10 MG tablet Take 10 mg by mouth daily as needed for allergies.    Marland Kitchen  morphine (MSIR) 15 MG tablet TAKE 1 TABLET BY MOUTH EVERY 4 HOURS AS NEEDED FOR SEVERE PAIN 90 tablet 0  . ondansetron (ZOFRAN) 8 MG tablet Take 1 tablet (8 mg total) by mouth every 8 (eight) hours as needed for nausea. 30 tablet 3  . pantoprazole (PROTONIX) 40 MG tablet Take 1 tablet (40 mg total) by mouth daily. Patient needs appt for further refills. 90 tablet 0  . polyethylene glycol (MIRALAX / GLYCOLAX) 17 g packet Take 17 g by mouth daily.    . predniSONE (DELTASONE) 10 MG tablet Take 0.5 tablets (5 mg total) by mouth daily with breakfast. Patient needs appt for further refills 90 tablet 0  . prochlorperazine (COMPAZINE) 10 MG tablet TAKE 1 TABLET BY MOUTH EVERY 6 HOURS AS NEEDED FOR NAUSEA OR VOMITING 60 tablet 3  . RABEprazole (ACIPHEX) 20 MG tablet Take 1 tablet (20 mg total) by mouth daily before breakfast. 90 tablet 3  . spironolactone (ALDACTONE) 25 MG tablet TAKE 1 TABLET BY MOUTH DAILY. 30 tablet 1   . vitamin C (ASCORBIC ACID) 500 MG tablet Take 500 mg by mouth daily.     No current facility-administered medications for this visit.   Facility-Administered Medications Ordered in Other Visits  Medication Dose Route Frequency Provider Last Rate Last Admin  . CARBOplatin (PARAPLATIN) 520 mg in sodium chloride 0.9 % 250 mL chemo infusion  520 mg Intravenous Once Alvy Bimler, Jenille Laszlo, MD      . dexamethasone (DECADRON) 10 mg in sodium chloride 0.9 % 50 mL IVPB  10 mg Intravenous Once Alvy Bimler, Chalice Philbert, MD 204 mL/hr at 04/10/21 1312 10 mg at 04/10/21 1312  . DOCEtaxel (TAXOTERE) 130 mg in sodium chloride 0.9 % 250 mL chemo infusion  60 mg/m2 (Treatment Plan Recorded) Intravenous Once Alvy Bimler, Roran Wegner, MD      . famotidine (PEPCID) IVPB 20 mg premix  20 mg Intravenous Once Alvy Bimler, Okechukwu Regnier, MD 200 mL/hr at 04/10/21 1300 20 mg at 04/10/21 1300  . fosaprepitant (EMEND) 150 mg in sodium chloride 0.9 % 145 mL IVPB  150 mg Intravenous Once Alvy Bimler, Jacquette Canales, MD      . heparin lock flush 100 unit/mL  500 Units Intracatheter Once PRN Alvy Bimler, Savyon Loken, MD      . sodium chloride flush (NS) 0.9 % injection 10 mL  10 mL Intracatheter PRN Alvy Bimler, Loys Shugars, MD        PHYSICAL EXAMINATION: ECOG PERFORMANCE STATUS: 1 - Symptomatic but completely ambulatory  Vitals:   04/10/21 1150  BP: (!) 162/80  Pulse: 87  Resp: 18  Temp: 97.9 F (36.6 C)  SpO2: 99%   Filed Weights   04/10/21 1150  Weight: 219 lb 6.4 oz (99.5 kg)    GENERAL:alert, no distress and comfortable SKIN: skin color, texture, turgor are normal, no rashes or significant lesions EYES: normal, Conjunctiva are pink and non-injected, sclera clear OROPHARYNX:no exudate, no erythema and lips, buccal mucosa, and tongue normal  NECK: supple, thyroid normal size, non-tender, without nodularity LYMPH:  no palpable lymphadenopathy in the cervical, axillary or inguinal LUNGS: clear to auscultation and percussion with normal breathing effort HEART: regular rate & rhythm and no murmurs  with moderate bilateral lower extremity edema ABDOMEN:abdomen soft, non-tender and normal bowel sounds Musculoskeletal:no cyanosis of digits and no clubbing  NEURO: alert & oriented x 3 with fluent speech, no focal motor/sensory deficits  LABORATORY DATA:  I have reviewed the data as listed    Component Value Date/Time   NA 142 04/10/2021 1127  K 4.1 04/10/2021 1127   CL 107 04/10/2021 1127   CO2 24 04/10/2021 1127   GLUCOSE 154 (H) 04/10/2021 1127   BUN 24 (H) 04/10/2021 1127   CREATININE 1.14 (H) 04/10/2021 1127   CALCIUM 9.7 04/10/2021 1127   PROT 7.9 04/10/2021 1127   ALBUMIN 3.7 04/10/2021 1127   AST 13 (L) 04/10/2021 1127   ALT 14 04/10/2021 1127   ALKPHOS 103 04/10/2021 1127   BILITOT 0.4 04/10/2021 1127   GFRNONAA 54 (L) 04/10/2021 1127   GFRAA >60 09/08/2020 0819    No results found for: SPEP, UPEP  Lab Results  Component Value Date   WBC 7.7 04/10/2021   NEUTROABS 6.1 04/10/2021   HGB 7.6 (L) 04/10/2021   HCT 23.0 (L) 04/10/2021   MCV 102.2 (H) 04/10/2021   PLT 116 (L) 04/10/2021      Chemistry      Component Value Date/Time   NA 142 04/10/2021 1127   K 4.1 04/10/2021 1127   CL 107 04/10/2021 1127   CO2 24 04/10/2021 1127   BUN 24 (H) 04/10/2021 1127   CREATININE 1.14 (H) 04/10/2021 1127      Component Value Date/Time   CALCIUM 9.7 04/10/2021 1127   ALKPHOS 103 04/10/2021 1127   AST 13 (L) 04/10/2021 1127   ALT 14 04/10/2021 1127   BILITOT 0.4 04/10/2021 1127

## 2021-04-10 NOTE — Assessment & Plan Note (Signed)
She denies worsening peripheral neuropathy from treatment.  Observe closely for now 

## 2021-04-10 NOTE — Assessment & Plan Note (Signed)
She has lung nodules and lymphadenopathy seen on prior CT imaging of the chest I will order repeat CT imaging of the chest for assessment

## 2021-04-10 NOTE — Progress Notes (Signed)
Contacted [name] during blood administration for elevated BP. Received verbal order for 10mg  IV Lasix. Post infusion patient BP still elevated ( see doc) and received verbal order for clonidine 0.1 mg. Patient was also c/o headache at that time. Patient BP checked and patient denied headache. Patient discharged and aware to check her BP at home.

## 2021-04-11 ENCOUNTER — Telehealth: Payer: Self-pay

## 2021-04-11 LAB — TYPE AND SCREEN
ABO/RH(D): A POS
Antibody Screen: NEGATIVE
Unit division: 0

## 2021-04-11 LAB — BPAM RBC
Blood Product Expiration Date: 202205172359
ISSUE DATE / TIME: 202204261617
Unit Type and Rh: 6200

## 2021-04-11 NOTE — Telephone Encounter (Signed)
Spoke with pt per MD Alvy Bimler. Pt reports feeling "much better" today. She reports her BP this am 118/66 with HR 61. Pt denies s/s dizziness, headaches, dyspnea, chest pain. Pt verbalizes that she is in good spirits and that she "feels fine so far after my treatment yesterday". Pt verbalizes that she will call [facility] with any questions/concerns.

## 2021-04-11 NOTE — Telephone Encounter (Signed)
-----   Message from [name], MD sent at 04/11/2021  9:50 AM EDT ----- [name],  Can you call and check on how she is feeling today?

## 2021-04-11 NOTE — Telephone Encounter (Signed)
Edit: Pt reports that she will continue to monitor her BP and HR at home

## 2021-04-13 ENCOUNTER — Telehealth: Payer: Self-pay

## 2021-04-13 NOTE — Telephone Encounter (Signed)
Erroneousness entry.

## 2021-04-17 ENCOUNTER — Encounter (HOSPITAL_COMMUNITY): Payer: Self-pay

## 2021-04-17 ENCOUNTER — Other Ambulatory Visit: Payer: Self-pay

## 2021-04-17 ENCOUNTER — Ambulatory Visit (HOSPITAL_COMMUNITY)
Admission: RE | Admit: 2021-04-17 | Discharge: 2021-04-17 | Disposition: A | Discharge status: Home / Self Care | Payer: Medicare HMO | Source: Ambulatory Visit | Attending: Hematology and Oncology | Admitting: Hematology and Oncology

## 2021-04-17 ENCOUNTER — Telehealth: Payer: Self-pay

## 2021-04-17 DIAGNOSIS — Z85048 Personal history of other malignant neoplasm of rectum, rectosigmoid junction, and anus: Secondary | ICD-10-CM | POA: Diagnosis not present

## 2021-04-17 DIAGNOSIS — C541 Malignant neoplasm of endometrium: Secondary | ICD-10-CM | POA: Diagnosis not present

## 2021-04-17 DIAGNOSIS — I700 Atherosclerosis of aorta: Secondary | ICD-10-CM | POA: Diagnosis not present

## 2021-04-17 DIAGNOSIS — D6481 Anemia due to antineoplastic chemotherapy: Secondary | ICD-10-CM

## 2021-04-17 DIAGNOSIS — T451X5A Adverse effect of antineoplastic and immunosuppressive drugs, initial encounter: Secondary | ICD-10-CM | POA: Diagnosis not present

## 2021-04-17 DIAGNOSIS — J8410 Pulmonary fibrosis, unspecified: Secondary | ICD-10-CM | POA: Diagnosis not present

## 2021-04-17 DIAGNOSIS — R590 Localized enlarged lymph nodes: Secondary | ICD-10-CM | POA: Diagnosis not present

## 2021-04-17 DIAGNOSIS — J479 Bronchiectasis, uncomplicated: Secondary | ICD-10-CM | POA: Diagnosis not present

## 2021-04-17 MED ORDER — IOHEXOL 300 MG/ML  SOLN
100.0000 mL | Freq: Once | INTRAMUSCULAR | Status: AC | PRN
  Administered 2021-04-17: 100 mL via INTRAVENOUS

## 2021-04-17 MED ORDER — HEPARIN SOD (PORK) LOCK FLUSH 100 UNIT/ML IV SOLN
INTRAVENOUS | Status: AC
  Filled 2021-04-17: qty 5

## 2021-04-17 MED ORDER — SODIUM CHLORIDE (PF) 0.9 % IJ SOLN
INTRAMUSCULAR | Status: AC
  Filled 2021-04-17: qty 50

## 2021-04-17 MED ORDER — HEPARIN SOD (PORK) LOCK FLUSH 100 UNIT/ML IV SOLN
500.0000 [IU] | Freq: Once | INTRAVENOUS | Status: AC
  Administered 2021-04-17: 500 [IU] via INTRAVENOUS

## 2021-04-17 NOTE — Telephone Encounter (Signed)
Spoke with [name] per MD Alvy Bimler regarding her upcoming appt. [name] verbalizes correct appt date and time.

## 2021-04-19 ENCOUNTER — Other Ambulatory Visit: Payer: Self-pay

## 2021-04-19 ENCOUNTER — Encounter: Payer: Self-pay | Admitting: Hematology and Oncology

## 2021-04-19 ENCOUNTER — Inpatient Hospital Stay: Payer: Medicare HMO | Attending: Hematology and Oncology | Admitting: Hematology and Oncology

## 2021-04-19 DIAGNOSIS — R600 Localized edema: Secondary | ICD-10-CM

## 2021-04-19 DIAGNOSIS — C541 Malignant neoplasm of endometrium: Secondary | ICD-10-CM | POA: Diagnosis not present

## 2021-04-19 DIAGNOSIS — D869 Sarcoidosis, unspecified: Secondary | ICD-10-CM

## 2021-04-19 DIAGNOSIS — D6481 Anemia due to antineoplastic chemotherapy: Secondary | ICD-10-CM | POA: Diagnosis not present

## 2021-04-19 DIAGNOSIS — Z5111 Encounter for antineoplastic chemotherapy: Secondary | ICD-10-CM | POA: Diagnosis not present

## 2021-04-19 DIAGNOSIS — D860 Sarcoidosis of lung: Secondary | ICD-10-CM | POA: Insufficient documentation

## 2021-04-19 DIAGNOSIS — T451X5A Adverse effect of antineoplastic and immunosuppressive drugs, initial encounter: Secondary | ICD-10-CM | POA: Diagnosis not present

## 2021-04-19 DIAGNOSIS — Z7189 Other specified counseling: Secondary | ICD-10-CM

## 2021-04-19 NOTE — Assessment & Plan Note (Signed)
Her pulmonary sarcoidosis is stable However, the lung nodules is improved This lung nodules are most consistent with metastatic disease that had responded to treatment She will continue follow-up with her pulmonologist for sarcoidosis management

## 2021-04-19 NOTE — Assessment & Plan Note (Signed)
She felt better after receiving recent blood transfusion Observe closely for now

## 2021-04-19 NOTE — Assessment & Plan Note (Signed)
We reviewed the plan of care and goals of care She is aware about the goals of treatment Ultimately, she is in agreement to proceed with 3 more cycles of therapy

## 2021-04-19 NOTE — Assessment & Plan Note (Signed)
I have reviewed multiple imaging study with the patient She have excellent response to treatment However, I warned her that 4 doses of chemotherapy is not enough Recommend 3 more cycles of treatment before repeating another imaging study

## 2021-04-19 NOTE — Assessment & Plan Note (Signed)
Her leg swelling has improved This is likely due to chronic fluid retention from prednisone Continue close observation

## 2021-04-19 NOTE — Progress Notes (Signed)
[place] OFFICE PROGRESS NOTE  Patient Care Team: [name], Veronica G, MD as PCP - General (Family Medicine)  ASSESSMENT & PLAN:  Endometrial cancer [facility]) I have reviewed multiple imaging study with the patient She have excellent response to treatment However, I warned her that 4 doses of chemotherapy is not enough Recommend 3 more cycles of treatment before repeating another imaging study  Anemia due to antineoplastic chemotherapy She felt better after receiving recent blood transfusion Observe closely for now  Sarcoidosis [name] Her pulmonary sarcoidosis is stable However, the lung nodules is improved This lung nodules are most consistent with metastatic disease that had responded to treatment She will continue follow-up with her pulmonologist for sarcoidosis management  Bilateral lower extremity edema Her leg swelling has improved This is likely due to chronic fluid retention from prednisone Continue close observation  Goals of care, counseling/discussion We reviewed the plan of care and goals of care She is aware about the goals of treatment Ultimately, she is in agreement to proceed with 3 more cycles of therapy   No orders of the defined types were placed in this encounter.   All questions were answered. The patient knows to call the clinic with any problems, questions or concerns. The total time spent in the appointment was 30 minutes encounter with patients including review of chart and various tests results, discussions about plan of care and coordination of care plan   [name], MD 04/19/2021 2:07 PM  INTERVAL HISTORY: Please see below for problem oriented charting. She returns for further follow-up to review CT imaging results Since last time I saw her, she felt fine She felt better after receiving blood transfusion No worsening peripheral neuropathy  SUMMARY OF ONCOLOGIC HISTORY: Oncology History Overview Note  ER/PR neg, MSI stable    Endometrial cancer ([place])  06/15/2018 Surgery   Surgeon: Donaciano Eva     Operation: Robotic-assisted laparoscopic total hysterectomy with bilateral salpingoophorectomy, SLN biopsy     Operative Findings:  : 6cm uterus, normal tubes and ovaries, omentum adherent to the anterior abdominal wall and uterine fundus. No suspicious nodes.   06/15/2018 Pathology Results   Procedure: Total hysterectomy with bilateral salpingo-oophorectomy. Bilateral obturator sentinel lymph node biopsies. Histologic type: Endometrioid adenocarcinoma. Histologic Grade: FIGO grade 1. Myometrial invasion: Depth of invasion: 15 mm Myometrial thickness: 20 mm Uterine Serosa Involvement: Not identified. Cervical stromal involvement: Not identified. Extent of involvement of other organs: Uninvolved. Lymphovascular invasion: Present. Regional Lymph Nodes: Examined: 2 Sentinel 0 Non-sentinel 2 Total Lymph nodes with metastasis: 0 Isolated tumor cells (< 0.2 mm): 2 Micrometastasis: (> 0.2 mm and < 2.0 mm): 0 Macrometastasis: (> 2.0 mm): 0 Extracapsular extension: N/A. Tumor block for ancillary studies: 4Q-S. MMR / MSI testing: Will be ordered. Pathologic Stage Classification (pTNM, AJCC 8th edition): pT1b, pN0(i+)   07/20/2018 Imaging   1. Surgical changes from recent hysterectomy. No findings suspicious for residual tumor or adenopathy in the pelvis. 2. No CT findings to suggest omental, peritoneal surface or solid organ metastatic disease in the abdomen. 3. Changes consistent with known sarcoidosis in the chest with bulky calcified mediastinal and hilar lymph nodes and interstitial lung disease. I do not see any definite CT findings to suggest metastatic disease involving the chest.   08/19/2018 - 09/17/2018 Radiation Therapy   08/19/18 - 09/17/18: Vaginal Cuff, 3 cm cylinder with treatment length of 3 cm / 30 Gy delivered in 5 fractions of 6 Gy, Brachytherapy / HDR, Iridium-192   05/2005: Left  Chest Wall  keloid   04/2004 - 05/2004: Anus, pelvis /inguinal ( Dr. Tammi Klippel)   01/19/2020 Imaging   1. No definite CT findings of the abdomen or pelvis to explain pain. 2. The sigmoid colon and rectum are decompressed although slightly thickened appearing, suggestive of nonspecific infectious, inflammatory, or ischemic colitis. Correlate for referable clinical symptoms. 3. There is trace, nonspecific free fluid in the low pelvis, which may be reactive, although would be difficult to distinguish from postoperative and post treatment soft tissue change in the pelvis given history of hysterectomy and rectal cancer. 4. Unchanged prominent retroperitoneal and iliac lymph nodes. No new findings suspicious for metastatic disease in the abdomen or pelvis. 5. Bibasilar pulmonary fibrosis and mediastinal and hilar lymphadenopathy, better evaluated by prior CT chest and consistent with patient diagnosis of sarcoidosis.       04/18/2020 Imaging   Venous Doppler Left:  - No evidence of deep vein thrombosis seen in the left lower extremity, from the common femoral through the popliteal veins.  - No evidence of superficial venous thrombosis in the left lower extremity.  - There is no evidence of venous reflux seen in the left lower extremity.  - Rouleax flow noted in the superficial system.    05/18/2020 PET scan   1. Complex scan with a combination of hypermetabolic granulomatous disease (sarcoidosis) as well as hypermetabolic metastatic endometrial carcinoma. 2. Hypermetabolic mediastinal hilar lymph nodes and pulmonary nodularity with bronchovascular thickening is consistent with sarcoidosis. 3. Solitary hypermetabolic nodule in the medial LEFT lower lobe, new from prior with intense metabolic activity, is concerning for a metastatic deposit to the LEFT lower lobe. 4. Ill-defined hypermetabolic soft tissue along the LEFT operator space is most consistent with local recurrence of endometrial carcinoma. There is entrapment  of the LEFT ureter by this hypermetabolic soft tissue with mild hydroureter and hydronephrosis on the LEFT. 5. Additionally there appears to be direct extension from the hypermetabolic left operator space soft tissue to the sigmoid colon with there is a well-defined hypermetabolic lesion in the colon. 6. More distant hypermetabolic peritoneal metastasis along the ventral peritoneal surface of the RIGHT abdomen beneath RIGHT rectus abdominus muscle. 7. Hypermetabolic lymph nodes in the porta hepatis are favored related to sarcoidosis.   05/19/2020 Cancer Staging   Staging form: Corpus Uteri - Carcinoma and Carcinosarcoma, AJCC 8th Edition - Clinical stage from 05/19/2020: Stage IVB (rcT1, cN2a, cM1) - Signed by [name], MD on 05/19/2020   05/26/2020 Procedure   Successful placement of a right internal jugular approach power injectable Port-A-Cath. The catheter is ready for immediate use   05/29/2020 - 09/27/2020 Chemotherapy   The patient had carboplatin and taxol for chemotherapy treatment.     07/31/2020 Imaging   1. Asymmetric soft tissue mass extending from the left vaginal cuff has decreased in size in the interval. No residual peritoneal nodularity. Pulmonary metastases are stable with exception of a minimally improved left lower lobe nodule. 2. Stable soft tissue nodule associated with the right rectus abdominus muscle. 3. Interval resolution of left hydronephrosis. 4. Calcified mediastinal and hilar lymph nodes, together with pulmonary parenchymal fibrosis, findings which are in keeping with patient's known diagnosis of sarcoid. 5. Aortic atherosclerosis (ICD10-I70.0). Coronary artery calcification.   10/27/2020 Imaging   1. Essentially stable appearance of the soft tissue density nodule posteriorly in the left rectus abdominus muscle, currently 1.8 by 1.2 cm, previously 11 mm. 2. Stable calcified adenopathy in the mediastinum and hila, compatible with prior granulomatous disease. 3.  Stable small right lower lobe pulmonary nodules. 4. Stable soft tissue density prominence along the left vaginal cuff extending up towards the adnexa, probably therapy related. 5. Other imaging findings of potential clinical significance: Coronary atherosclerosis. Small type 1 hiatal hernia. Stable subpleural reticulation favoring the lung bases, possibly from mild fibrosis. 6. Aortic atherosclerosis.   01/19/2021 Imaging   1. Status post hysterectomy. Interval enlargement of soft tissue adjacent to the left aspect of the vaginal cuff and bladder, measuring approximately 4.3 x 2.9 cm in axial section, previously previously previously 3.7 x 1.8 cm when measured similarly. Extensive soft tissue thickening and stranding throughout the low pelvis. 2. Interval enlargement of a metastatic nodule within the right rectus abdominus. New small peritoneal nodule anterior to the sigmoid colon. 3. Interval enlargement of retroperitoneal retroperitoneal lymph nodes. 4. Multiple small bilateral pulmonary nodules, several of which are new and enlarged. 5. Constellation of findings above is consistent with worsened metastatic disease. 6. New left hydronephrosis and hydroureter, the distal left ureter obstructed by soft tissue near the left aspect of the urinary bladder and vaginal cuff. 7. Unchanged enlarged, partially calcified mediastinal and hilar lymph nodes,, in keeping with prior granulomatous infection or sarcoidosis. 8. Mild underlying pulmonary fibrosis, in a UIP pattern. 9. Coronary artery disease.   02/02/2021 -  Chemotherapy    Patient is on Treatment Plan: OVARIAN CARBOPLATIN / DOCETAXEL Q21D      04/17/2021 Imaging   1. Interval decrease in size of multiple pulmonary nodules, with resolution of some previously seen nodules. 2. Interval decrease in size of enlarged retroperitoneal lymph nodes. 3. Interval decrease in size of soft tissue at the left aspect of the vaginal cuff status post  hysterectomy. 4. Interval decrease in size of a soft tissue nodule within the posterior aspect of the right rectus abdominus muscle body. 5. Constellation of findings is consistent with treatment response of locally recurrent, nodal, and distant metastatic disease. 6. There has been interval resolution of previously seen left hydronephrosis and hydroureter. 7. 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     02/02/2021 -  Chemotherapy    Patient is on Treatment Plan: OVARIAN CARBOPLATIN / DOCETAXEL Q21D        REVIEW OF SYSTEMS:   Constitutional: Denies fevers, chills or abnormal weight loss Eyes: Denies blurriness of vision Ears, nose, mouth, throat, and face: Denies mucositis or sore throat Respiratory: Denies cough, dyspnea or wheezes Cardiovascular: Denies palpitation, chest discomfort  Gastrointestinal:  Denies nausea, heartburn or change in bowel habits Skin: Denies abnormal skin rashes Lymphatics: Denies new lymphadenopathy or easy bruising Neurological:Denies numbness, tingling or new weaknesses Behavioral/Psych: Mood is stable, no new changes  All other systems were reviewed with the patient and are negative.  I have reviewed the past medical history, past surgical history, social history and family history with the patient and they are unchanged from previous  note.  ALLERGIES:  is allergic to shellfish allergy and penicillins.  MEDICATIONS:  Current Outpatient Medications  Medication Sig Dispense Refill  . albuterol (VENTOLIN HFA) 108 (90 Base) MCG/ACT inhaler INHALE 2 PUFFS INTO THE LUNGS EVERY 6 HOURS AS NEEDED FOR WHEEZING OR SHORTNESS OF BREATH (NEED MD APPOINTMENT) 3 each 0  . bisoprolol (ZEBETA) 10 MG tablet Take 1 tablet (10 mg total) by mouth in the morning and at bedtime. 180 tablet 0  . budesonide-formoterol (SYMBICORT) 80-4.5 MCG/ACT inhaler Inhale 2 puffs into the lungs 2 (two) times daily. 3 Inhaler 3  . desonide (DESOWEN) 0.05 % lotion Apply topically 2 (two) times daily. Continue following with dermatologist 59 mL 0  . dexamethasone (DECADRON) 4 MG tablet TAKE 2 TABLETS BY MOUTH 2 TIMES DAILY THE DAY BEFORE TAXOTERE THEN ONCE DAILY FOR 3 DAYS AFTER CARBOPLATIN CHEMO 30 tablet 1  . dextromethorphan (DELSYM) 30 MG/5ML liquid Take by mouth as needed for cough.     . hydrochlorothiazide (HYDRODIURIL) 25 MG tablet Take 25 mg by mouth daily.    . hydrocortisone 2.5 % cream Apply 1 application topically 2 (two) times daily.    Marland Kitchen loratadine (CLARITIN) 10 MG tablet Take 10 mg by mouth daily as needed for allergies.    Marland Kitchen morphine (MSIR) 15 MG tablet TAKE 1 TABLET BY MOUTH EVERY 4 HOURS AS NEEDED FOR SEVERE PAIN 90 tablet 0  . ondansetron (ZOFRAN) 8 MG tablet Take 1 tablet (8 mg total) by mouth every 8 (eight) hours as needed for nausea. 30 tablet 3  . pantoprazole (PROTONIX) 40 MG tablet Take 1 tablet (40 mg total) by mouth daily. Patient needs appt for further refills. 90 tablet 0  . polyethylene glycol (MIRALAX / GLYCOLAX) 17 [name] packet Take 17 [name] by mouth daily.    . predniSONE (DELTASONE) 10 MG tablet Take 0.5 tablets (5 mg total) by mouth daily with breakfast. Patient needs appt for further refills 90 tablet 0  . prochlorperazine (COMPAZINE) 10 MG tablet TAKE 1 TABLET BY MOUTH EVERY 6 HOURS AS NEEDED FOR NAUSEA OR VOMITING 60 tablet 3  .  RABEprazole (ACIPHEX) 20 MG tablet Take 1 tablet (20 mg total) by mouth daily before breakfast. 90 tablet 3  . spironolactone (ALDACTONE) 25 MG tablet TAKE 1 TABLET BY MOUTH DAILY. 30 tablet 1  . vitamin C (ASCORBIC ACID) 500 MG tablet Take 500 mg by mouth daily.     No current facility-administered medications for this visit.    PHYSICAL EXAMINATION: ECOG PERFORMANCE STATUS: 1 - Symptomatic but completely ambulatory  Vitals:   04/19/21 1255  BP: (!) 161/79  Pulse: 88  Resp: 18  Temp: 99.1 F (37.3 C)  SpO2: 100%   Filed Weights   04/19/21 1255  Weight: 217 lb 3.2 oz (98.5 kg)    GENERAL:alert, no distress and comfortable NEURO: alert & oriented x 3 with fluent speech, no focal motor/sensory deficits  LABORATORY DATA:  I have reviewed the data as listed    Component Value Date/Time   NA 142 04/10/2021 1127   K 4.1 04/10/2021 1127   CL 107 04/10/2021 1127   CO2 24 04/10/2021 1127   GLUCOSE 154 (H) 04/10/2021 1127   BUN 24 (H) 04/10/2021 1127   CREATININE 1.14 (H) 04/10/2021 1127   CALCIUM 9.7 04/10/2021 1127   PROT 7.9 04/10/2021 1127   ALBUMIN 3.7 04/10/2021 1127   AST 13 (L) 04/10/2021 1127   ALT 14 04/10/2021 1127   ALKPHOS 103 04/10/2021 1127   BILITOT  0.4 04/10/2021 1127   GFRNONAA 54 (L) 04/10/2021 1127   GFRAA >60 09/08/2020 0819    No results found for: SPEP, UPEP  Lab Results  Component Value Date   WBC 7.7 04/10/2021   NEUTROABS 6.1 04/10/2021   HGB 7.6 (L) 04/10/2021   HCT 23.0 (L) 04/10/2021   MCV 102.2 (H) 04/10/2021   PLT 116 (L) 04/10/2021      Chemistry      Component Value Date/Time   NA 142 04/10/2021 1127   K 4.1 04/10/2021 1127   CL 107 04/10/2021 1127   CO2 24 04/10/2021 1127   BUN 24 (H) 04/10/2021 1127   CREATININE 1.14 (H) 04/10/2021 1127      Component Value Date/Time   CALCIUM 9.7 04/10/2021 1127   ALKPHOS 103 04/10/2021 1127   AST 13 (L) 04/10/2021 1127   ALT 14 04/10/2021 1127   BILITOT 0.4 04/10/2021 1127        RADIOGRAPHIC STUDIES: I have reviewed multiple imaging studies with the patient and family I have personally reviewed the radiological images as listed and agreed with the findings in the report. CT CHEST ABDOMEN PELVIS W CONTRAST  Result Date: 04/17/2021 CLINICAL DATA:  Endometrial cancer, ongoing chemotherapy, history of rectal cancer status post chemotherapy and XRT EXAM: CT CHEST, ABDOMEN, AND PELVIS WITH CONTRAST TECHNIQUE: Multidetector CT imaging of the chest, abdomen and pelvis was performed following the standard protocol during bolus administration of intravenous contrast. CONTRAST:  123m OMNIPAQUE IOHEXOL 300 MG/ML SOLN, additional oral enteric contrast COMPARISON:  01/19/2021 FINDINGS: CT CHEST FINDINGS Cardiovascular: Right chest port catheter. Normal heart size. No pericardial effusion. Mediastinum/Nodes: Unchanged enlarged, calcified mediastinal and hilar lymph nodes. Thyroid gland, trachea, and esophagus demonstrate no significant findings. Lungs/Pleura: Significant interval decrease in size of multiple pulmonary nodules, with resolution of some previously seen nodules, an index nodule of the dependent right lower lobe measuring 4 mm, previously 6 mm (series 6, image 90). Near complete resolution of other nodules in the adjacent right lower lobe, as well as near complete resolution of a previously seen 7 mm nodule of the lingula (series 6, image 92). Redemonstrated mild pulmonary fibrosis in a bibasilar predominant pattern featuring irregular peripheral interstitial opacity, subpleural bronchiolectasis, and probable honeycombing. No pleural effusion or pneumothorax. Musculoskeletal: No chest wall mass or suspicious bone lesions identified. CT ABDOMEN PELVIS FINDINGS Hepatobiliary: No focal liver abnormality is seen. Status post cholecystectomy. No biliary dilatation. Pancreas: Unremarkable. No pancreatic ductal dilatation or surrounding inflammatory changes. Spleen: Normal in size without  significant abnormality. Adrenals/Urinary Tract: Adrenal glands are unremarkable. Kidneys are normal, without renal calculi, solid lesion, or hydronephrosis. There has been interval resolution of previously seen left hydronephrosis and hydroureter. Bladder is unremarkable. Stomach/Bowel: Stomach is within normal limits. Appendix appears normal. No evidence of bowel wall thickening, distention, or inflammatory changes. Vascular/Lymphatic: Aortic atherosclerosis. Interval decrease in size of enlarged retroperitoneal lymph nodes, index left retroperitoneal node measuring 1.0 x 0.6 cm, previously 2.0 x 1.4 cm (series 2, image 71). Reproductive: Status post hysterectomy. Significant interval decrease in size of soft tissue at the left aspect of the vaginal cuff, measuring approximately 3.6 x 1.5 cm, previously 4.3 x 2.9 cm when measured similarly (series 2, image 103). Other: No abdominal wall hernia or abnormality. No abdominopelvic ascites. Musculoskeletal: No acute or significant osseous findings. Significant interval decrease in size of a soft tissue nodule within the posterior aspect of the right rectus abdominus muscle body, measuring approximately 1.6 x 1.1 cm, previously 2.0 x  2.0 cm (series 2, image 81). A previously seen soft tissue nodule anterior to the sigmoid colon is resolved. IMPRESSION: 1. Interval decrease in size of multiple pulmonary nodules, with resolution of some previously seen nodules. 2. Interval decrease in size of enlarged retroperitoneal lymph nodes. 3. Interval decrease in size of soft tissue at the left aspect of the vaginal cuff status post hysterectomy. 4. Interval decrease in size of a soft tissue nodule within the posterior aspect of the right rectus abdominus muscle body. 5. Constellation of findings is consistent with treatment response of locally recurrent, nodal, and distant metastatic disease. 6. There has been interval resolution of previously seen left hydronephrosis and  hydroureter. 7. 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 Aortic Atherosclerosis (ICD10-I70.0). Electronically Signed   By: Eddie Candle M.D.   On: 04/17/2021 13:07

## 2021-04-20 ENCOUNTER — Ambulatory Visit: Payer: Medicare HMO | Admitting: Hematology and Oncology

## 2021-04-24 ENCOUNTER — Telehealth: Payer: Self-pay | Admitting: Hematology and Oncology

## 2021-04-24 NOTE — Telephone Encounter (Signed)
sch per 5/5 los, pt aware.

## 2021-04-26 ENCOUNTER — Other Ambulatory Visit (HOSPITAL_COMMUNITY): Payer: Self-pay

## 2021-04-26 MED FILL — Dexamethasone Tab 4 MG: ORAL | 6 days supply | Qty: 30 | Fill #0 | Status: AC

## 2021-04-30 MED FILL — Dexamethasone Sodium Phosphate Inj 100 MG/10ML: INTRAMUSCULAR | Qty: 1 | Status: AC

## 2021-05-01 ENCOUNTER — Other Ambulatory Visit: Payer: Self-pay | Admitting: Hematology and Oncology

## 2021-05-01 ENCOUNTER — Inpatient Hospital Stay: Payer: Medicare HMO

## 2021-05-01 ENCOUNTER — Other Ambulatory Visit: Payer: Self-pay

## 2021-05-01 ENCOUNTER — Telehealth: Payer: Self-pay | Admitting: Hematology and Oncology

## 2021-05-01 ENCOUNTER — Inpatient Hospital Stay: Payer: Medicare HMO | Admitting: Hematology and Oncology

## 2021-05-01 ENCOUNTER — Encounter: Payer: Self-pay | Admitting: Hematology and Oncology

## 2021-05-01 DIAGNOSIS — C541 Malignant neoplasm of endometrium: Secondary | ICD-10-CM | POA: Diagnosis not present

## 2021-05-01 DIAGNOSIS — T451X5A Adverse effect of antineoplastic and immunosuppressive drugs, initial encounter: Secondary | ICD-10-CM

## 2021-05-01 DIAGNOSIS — D61818 Other pancytopenia: Secondary | ICD-10-CM | POA: Diagnosis not present

## 2021-05-01 DIAGNOSIS — C775 Secondary and unspecified malignant neoplasm of intrapelvic lymph nodes: Secondary | ICD-10-CM

## 2021-05-01 DIAGNOSIS — D6481 Anemia due to antineoplastic chemotherapy: Secondary | ICD-10-CM

## 2021-05-01 DIAGNOSIS — C7802 Secondary malignant neoplasm of left lung: Secondary | ICD-10-CM

## 2021-05-01 DIAGNOSIS — Z5111 Encounter for antineoplastic chemotherapy: Secondary | ICD-10-CM | POA: Diagnosis not present

## 2021-05-01 DIAGNOSIS — R600 Localized edema: Secondary | ICD-10-CM | POA: Diagnosis not present

## 2021-05-01 DIAGNOSIS — G620 Drug-induced polyneuropathy: Secondary | ICD-10-CM

## 2021-05-01 DIAGNOSIS — I10 Essential (primary) hypertension: Secondary | ICD-10-CM | POA: Diagnosis not present

## 2021-05-01 DIAGNOSIS — D860 Sarcoidosis of lung: Secondary | ICD-10-CM | POA: Diagnosis not present

## 2021-05-01 LAB — CBC WITH DIFFERENTIAL (CANCER CENTER ONLY)
Abs Immature Granulocytes: 0.12 10*3/uL — ABNORMAL HIGH (ref 0.00–0.07)
Basophils Absolute: 0 10*3/uL (ref 0.0–0.1)
Basophils Relative: 0 %
Eosinophils Absolute: 0 10*3/uL (ref 0.0–0.5)
Eosinophils Relative: 0 %
HCT: 23.8 % — ABNORMAL LOW (ref 36.0–46.0)
Hemoglobin: 8.2 g/dL — ABNORMAL LOW (ref 12.0–15.0)
Immature Granulocytes: 2 %
Lymphocytes Relative: 9 %
Lymphs Abs: 0.7 10*3/uL (ref 0.7–4.0)
MCH: 34.9 pg — ABNORMAL HIGH (ref 26.0–34.0)
MCHC: 34.5 g/dL (ref 30.0–36.0)
MCV: 101.3 fL — ABNORMAL HIGH (ref 80.0–100.0)
Monocytes Absolute: 0.3 10*3/uL (ref 0.1–1.0)
Monocytes Relative: 4 %
Neutro Abs: 6.5 10*3/uL (ref 1.7–7.7)
Neutrophils Relative %: 85 %
Platelet Count: 120 10*3/uL — ABNORMAL LOW (ref 150–400)
RBC: 2.35 MIL/uL — ABNORMAL LOW (ref 3.87–5.11)
RDW: 19.9 % — ABNORMAL HIGH (ref 11.5–15.5)
WBC Count: 7.6 10*3/uL (ref 4.0–10.5)
nRBC: 0.7 % — ABNORMAL HIGH (ref 0.0–0.2)

## 2021-05-01 LAB — CMP (CANCER CENTER ONLY)
ALT: 17 U/L (ref 0–44)
AST: 14 U/L — ABNORMAL LOW (ref 15–41)
Albumin: 3.6 g/dL (ref 3.5–5.0)
Alkaline Phosphatase: 102 U/L (ref 38–126)
Anion gap: 10 (ref 5–15)
BUN: 25 mg/dL — ABNORMAL HIGH (ref 8–23)
CO2: 23 mmol/L (ref 22–32)
Calcium: 9.9 mg/dL (ref 8.9–10.3)
Chloride: 104 mmol/L (ref 98–111)
Creatinine: 0.95 mg/dL (ref 0.44–1.00)
GFR, Estimated: >60 mL/min (ref >60)
Glucose, Bld: 229 mg/dL — ABNORMAL HIGH (ref 70–99)
Potassium: 4.6 mmol/L (ref 3.5–5.1)
Sodium: 137 mmol/L (ref 135–145)
Total Bilirubin: 0.4 mg/dL (ref 0.3–1.2)
Total Protein: 8 g/dL (ref 6.5–8.1)

## 2021-05-01 LAB — SAMPLE TO BLOOD BANK

## 2021-05-01 MED ORDER — DIPHENHYDRAMINE HCL 50 MG/ML IJ SOLN
INTRAMUSCULAR | Status: AC
  Filled 2021-05-01: qty 1

## 2021-05-01 MED ORDER — SODIUM CHLORIDE 0.9 % IV SOLN
60.0000 mg/m2 | Freq: Once | INTRAVENOUS | Status: AC
  Administered 2021-05-01: 130 mg via INTRAVENOUS
  Filled 2021-05-01: qty 13

## 2021-05-01 MED ORDER — DEXAMETHASONE SODIUM PHOSPHATE 10 MG/ML IJ SOLN
INTRAMUSCULAR | Status: AC
  Filled 2021-05-01: qty 1

## 2021-05-01 MED ORDER — PALONOSETRON HCL INJECTION 0.25 MG/5ML
0.2500 mg | Freq: Once | INTRAVENOUS | Status: AC
Start: 2021-05-01 — End: 2021-05-01
  Administered 2021-05-01: 0.25 mg via INTRAVENOUS

## 2021-05-01 MED ORDER — PALONOSETRON HCL INJECTION 0.25 MG/5ML
INTRAVENOUS | Status: AC
  Filled 2021-05-01: qty 5

## 2021-05-01 MED ORDER — SODIUM CHLORIDE 0.9 % IV SOLN
Freq: Once | INTRAVENOUS | Status: AC
  Filled 2021-05-01: qty 250

## 2021-05-01 MED ORDER — SODIUM CHLORIDE 0.9% FLUSH
10.0000 mL | Freq: Once | INTRAVENOUS | Status: AC
  Administered 2021-05-01: 10 mL
  Filled 2021-05-01: qty 10

## 2021-05-01 MED ORDER — FAMOTIDINE 20 MG IN NS 100 ML IVPB
INTRAVENOUS | Status: AC
  Filled 2021-05-01: qty 100

## 2021-05-01 MED ORDER — FAMOTIDINE 20 MG IN NS 100 ML IVPB
20.0000 mg | Freq: Once | INTRAVENOUS | Status: AC
Start: 2021-05-01 — End: 2021-05-01
  Administered 2021-05-01: 20 mg via INTRAVENOUS

## 2021-05-01 MED ORDER — SODIUM CHLORIDE 0.9% FLUSH
10.0000 mL | INTRAVENOUS | Status: DC | PRN
Start: 2021-05-01 — End: 2021-05-01
  Administered 2021-05-01: 10 mL
  Filled 2021-05-01: qty 10

## 2021-05-01 MED ORDER — DIPHENHYDRAMINE HCL 50 MG/ML IJ SOLN
25.0000 mg | Freq: Once | INTRAMUSCULAR | Status: AC
Start: 2021-05-01 — End: 2021-05-01
  Administered 2021-05-01: 25 mg via INTRAVENOUS

## 2021-05-01 MED ORDER — DEXAMETHASONE SODIUM PHOSPHATE 10 MG/ML IJ SOLN
5.0000 mg | Freq: Once | INTRAMUSCULAR | Status: AC
Start: 2021-05-01 — End: 2021-05-01
  Administered 2021-05-01: 5 mg via INTRAVENOUS

## 2021-05-01 MED ORDER — SODIUM CHLORIDE 0.9 % IV SOLN
490.0000 mg | Freq: Once | INTRAVENOUS | Status: AC
  Administered 2021-05-01: 490 mg via INTRAVENOUS
  Filled 2021-05-01: qty 49

## 2021-05-01 MED ORDER — HEPARIN SOD (PORK) LOCK FLUSH 100 UNIT/ML IV SOLN
500.0000 [IU] | Freq: Once | INTRAVENOUS | Status: AC | PRN
  Administered 2021-05-01: 500 [IU]
  Filled 2021-05-01: qty 5

## 2021-05-01 MED ORDER — SODIUM CHLORIDE 0.9 % IV SOLN: 5.0000 mg | Freq: Once | INTRAVENOUS | Status: DC

## 2021-05-01 MED ORDER — SODIUM CHLORIDE 0.9 % IV SOLN
150.0000 mg | Freq: Once | INTRAVENOUS | Status: AC
  Administered 2021-05-01: 150 mg via INTRAVENOUS
  Filled 2021-05-01: qty 150

## 2021-05-01 NOTE — Telephone Encounter (Signed)
Scheduled appointment per 05/17 schedule message. Patient received calender

## 2021-05-01 NOTE — Assessment & Plan Note (Signed)
She has severe pancytopenia due to treatment Previously, I have ordered vitamin B12 and iron studies and they were adequate The most likely cause is due to chemotherapy She does not need blood transfusion today With recent excellent response to treatment, I plan to reduce the dose of carboplatin a little bit

## 2021-05-01 NOTE — Assessment & Plan Note (Signed)
Her recent imaging show good response to treatment The patient had progressive weight gain The calculated dose came back very high and she has significant pancytopenia I plan to reduce the dose of carboplatin a little bit and will adjust based on her weight today We discussed the importance of weight control while on treatment

## 2021-05-01 NOTE — Assessment & Plan Note (Signed)
She denies worsening peripheral neuropathy from treatment.  Observe closely for now 

## 2021-05-01 NOTE — Assessment & Plan Note (Signed)
Her documented blood pressure here is very high but at home, her blood pressure were within normal limits She will continue her current prescribed antihypertensives

## 2021-05-01 NOTE — Progress Notes (Signed)
[place] OFFICE PROGRESS NOTE  Patient Care Team: [name], [name], MD as PCP - General (Family Medicine)  ASSESSMENT & PLAN:  Endometrial cancer [facility]) Her recent imaging show good response to treatment The patient had progressive weight gain The calculated dose came back very high and she has significant pancytopenia I plan to reduce the dose of carboplatin a little bit and will adjust based on her weight today We discussed the importance of weight control while on treatment  Pancytopenia, acquired ([place]) She has severe pancytopenia due to treatment Previously, I have ordered vitamin B12 and iron studies and they were adequate The most likely cause is due to chemotherapy She does not need blood transfusion today With recent excellent response to treatment, I plan to reduce the dose of carboplatin a little bit  Peripheral neuropathy due to chemotherapy [facility]) She denies worsening peripheral neuropathy from treatment.  Observe closely for now  Essential hypertension Her documented blood pressure here is very high but at home, her blood pressure were within normal limits She will continue her current prescribed antihypertensives   No orders of the defined types were placed in this encounter.   All questions were answered. The patient knows to call the clinic with any problems, questions or concerns. The total time spent in the appointment was 30 minutes encounter with patients including review of chart and various tests results, discussions about plan of care and coordination of care plan   [name], MD 05/01/2021 3:27 PM  INTERVAL HISTORY: Please see below for problem oriented charting. She returns here for further follow-up She has gained a little weight since last time I saw her due to poor dietary choices Her documented blood pressure from home were satisfactory, usually systolic blood pressure in the 130s She denies worsening peripheral neuropathy No  recent nausea, abdominal pain no changes in bowel habits The patient denies any recent signs or symptoms of bleeding such as spontaneous epistaxis, hematuria or hematochezia.  SUMMARY OF ONCOLOGIC HISTORY: Oncology History Overview Note  ER/PR neg, MSI stable   Endometrial cancer ([place])  06/15/2018 Surgery   Surgeon: Donaciano Eva     Operation: Robotic-assisted laparoscopic total hysterectomy with bilateral salpingoophorectomy, SLN biopsy     Operative Findings:  : 6cm uterus, normal tubes and ovaries, omentum adherent to the anterior abdominal wall and uterine fundus. No suspicious nodes.   06/15/2018 Pathology Results   Procedure: Total hysterectomy with bilateral salpingo-oophorectomy. Bilateral obturator sentinel lymph node biopsies. Histologic type: Endometrioid adenocarcinoma. Histologic Grade: FIGO grade 1. Myometrial invasion: Depth of invasion: 15 mm Myometrial thickness: 20 mm Uterine Serosa Involvement: Not identified. Cervical stromal involvement: Not identified. Extent of involvement of other organs: Uninvolved. Lymphovascular invasion: Present. Regional Lymph Nodes: Examined: 2 Sentinel 0 Non-sentinel 2 Total Lymph nodes with metastasis: 0 Isolated tumor cells (< 0.2 mm): 2 Micrometastasis: (> 0.2 mm and < 2.0 mm): 0 Macrometastasis: (> 2.0 mm): 0 Extracapsular extension: N/A. Tumor block for ancillary studies: 4Q-S. MMR / MSI testing: Will be ordered. Pathologic Stage Classification (pTNM, AJCC 8th edition): pT1b, pN0(i+)   07/20/2018 Imaging   1. Surgical changes from recent hysterectomy. No findings suspicious for residual tumor or adenopathy in the pelvis. 2. No CT findings to suggest omental, peritoneal surface or solid organ metastatic disease in the abdomen. 3. Changes consistent with known sarcoidosis in the chest with bulky calcified mediastinal and hilar lymph nodes and interstitial lung disease. I do not see any definite CT findings to suggest  metastatic disease involving the chest.   08/19/2018 - 09/17/2018 Radiation Therapy   08/19/18 - 09/17/18: Vaginal Cuff, 3 cm cylinder with treatment length of 3 cm / 30 Gy delivered in 5 fractions of 6 Gy, Brachytherapy / HDR, Iridium-192   05/2005: Left Chest Wall keloid   04/2004 - 05/2004: Anus, pelvis /inguinal ( Dr. Tammi Klippel)   01/19/2020 Imaging   1. No definite CT findings of the abdomen or pelvis to explain pain. 2. The sigmoid colon and rectum are decompressed although slightly thickened appearing, suggestive of nonspecific infectious, inflammatory, or ischemic colitis. Correlate for referable clinical symptoms. 3. There is trace, nonspecific free fluid in the low pelvis, which may be reactive, although would be difficult to distinguish from postoperative and post treatment soft tissue change in the pelvis given history of hysterectomy and rectal cancer. 4. Unchanged prominent retroperitoneal and iliac lymph nodes. No new findings suspicious for metastatic disease in the abdomen or pelvis. 5. Bibasilar pulmonary fibrosis and mediastinal and hilar lymphadenopathy, better evaluated by prior CT chest and consistent with patient diagnosis of sarcoidosis.       04/18/2020 Imaging   Venous Doppler Left:  - No evidence of deep vein thrombosis seen in the left lower extremity, from the common femoral through the popliteal veins.  - No evidence of superficial venous thrombosis in the left lower extremity.  - There is no evidence of venous reflux seen in the left lower extremity.  - Rouleax flow noted in the superficial system.    05/18/2020 PET scan   1. Complex scan with a combination of hypermetabolic granulomatous disease (sarcoidosis) as well as hypermetabolic metastatic endometrial carcinoma. 2. Hypermetabolic mediastinal hilar lymph nodes and pulmonary nodularity with bronchovascular thickening is consistent with sarcoidosis. 3. Solitary hypermetabolic nodule in the medial LEFT lower lobe, new  from prior with intense metabolic activity, is concerning for a metastatic deposit to the LEFT lower lobe. 4. Ill-defined hypermetabolic soft tissue along the LEFT operator space is most consistent with local recurrence of endometrial carcinoma. There is entrapment of the LEFT ureter by this hypermetabolic soft tissue with mild hydroureter and hydronephrosis on the LEFT. 5. Additionally there appears to be direct extension from the hypermetabolic left operator space soft tissue to the sigmoid colon with there is a well-defined hypermetabolic lesion in the colon. 6. More distant hypermetabolic peritoneal metastasis along the ventral peritoneal surface of the RIGHT abdomen beneath RIGHT rectus abdominus muscle. 7. Hypermetabolic lymph nodes in the porta hepatis are favored related to sarcoidosis.   05/19/2020 Cancer Staging   Staging form: Corpus Uteri - Carcinoma and Carcinosarcoma, AJCC 8th Edition - Clinical stage from 05/19/2020: Stage IVB (rcT1, cN2a, cM1) - Signed by [name], MD on 05/19/2020   05/26/2020 Procedure   Successful placement of a right internal jugular approach power injectable Port-A-Cath. The catheter is ready for immediate use   05/29/2020 - 09/27/2020 Chemotherapy   The patient had carboplatin and taxol for chemotherapy treatment.     07/31/2020 Imaging   1. Asymmetric soft tissue mass extending from the left vaginal cuff has decreased in size in the interval. No residual peritoneal nodularity. Pulmonary metastases are stable with exception of a minimally improved left lower lobe nodule. 2. Stable soft tissue nodule associated with the right rectus abdominus muscle. 3. Interval resolution of left hydronephrosis. 4. Calcified mediastinal and hilar lymph nodes, together with pulmonary parenchymal fibrosis, findings which are in keeping with patient's known diagnosis of sarcoid. 5. Aortic atherosclerosis (ICD10-I70.0). Coronary artery calcification.  10/27/2020 Imaging   1.  Essentially stable appearance of the soft tissue density nodule posteriorly in the left rectus abdominus muscle, currently 1.8 by 1.2 cm, previously 11 mm. 2. Stable calcified adenopathy in the mediastinum and hila, compatible with prior granulomatous disease. 3. Stable small right lower lobe pulmonary nodules. 4. Stable soft tissue density prominence along the left vaginal cuff extending up towards the adnexa, probably therapy related. 5. Other imaging findings of potential clinical significance: Coronary atherosclerosis. Small type 1 hiatal hernia. Stable subpleural reticulation favoring the lung bases, possibly from mild fibrosis. 6. Aortic atherosclerosis.   01/19/2021 Imaging   1. Status post hysterectomy. Interval enlargement of soft tissue adjacent to the left aspect of the vaginal cuff and bladder, measuring approximately 4.3 x 2.9 cm in axial section, previously previously previously 3.7 x 1.8 cm when measured similarly. Extensive soft tissue thickening and stranding throughout the low pelvis. 2. Interval enlargement of a metastatic nodule within the right rectus abdominus. New small peritoneal nodule anterior to the sigmoid colon. 3. Interval enlargement of retroperitoneal retroperitoneal lymph nodes. 4. Multiple small bilateral pulmonary nodules, several of which are new and enlarged. 5. Constellation of findings above is consistent with worsened metastatic disease. 6. New left hydronephrosis and hydroureter, the distal left ureter obstructed by soft tissue near the left aspect of the urinary bladder and vaginal cuff. 7. Unchanged enlarged, partially calcified mediastinal and hilar lymph nodes,, in keeping with prior granulomatous infection or sarcoidosis. 8. Mild underlying pulmonary fibrosis, in a UIP pattern. 9. Coronary artery disease.   02/02/2021 -  Chemotherapy    Patient is on Treatment Plan: OVARIAN CARBOPLATIN / DOCETAXEL Q21D      04/17/2021 Imaging   1. Interval decrease in  size of multiple pulmonary nodules, with resolution of some previously seen nodules. 2. Interval decrease in size of enlarged retroperitoneal lymph nodes. 3. Interval decrease in size of soft tissue at the left aspect of the vaginal cuff status post hysterectomy. 4. Interval decrease in size of a soft tissue nodule within the posterior aspect of the right rectus abdominus muscle body. 5. Constellation of findings is consistent with treatment response of locally recurrent, nodal, and distant metastatic disease. 6. There has been interval resolution of previously seen left hydronephrosis and hydroureter. 7. 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     02/02/2021 -  Chemotherapy    Patient is on Treatment Plan: OVARIAN CARBOPLATIN / DOCETAXEL Q21D        REVIEW OF SYSTEMS:   Constitutional: Denies fevers, chills or abnormal weight loss Eyes: Denies blurriness of vision Ears, nose, mouth, throat, and face: Denies mucositis or sore throat Respiratory: Denies cough, dyspnea or wheezes Cardiovascular: Denies palpitation, chest discomfort or lower extremity swelling Gastrointestinal:  Denies nausea, heartburn or change in bowel habits Skin: Denies abnormal skin rashes Lymphatics: Denies new lymphadenopathy or easy bruising Neurological:Denies numbness, tingling or  new  weaknesses Behavioral/Psych: Mood is stable, no new changes  All other systems were reviewed with the patient and are negative.  I have reviewed the past medical history, past surgical history, social history and family history with the patient and they are unchanged from previous note.  ALLERGIES:  is allergic to shellfish allergy and penicillins.  MEDICATIONS:  Current Outpatient Medications  Medication Sig Dispense Refill  . albuterol (VENTOLIN HFA) 108 (90 Base) MCG/ACT inhaler INHALE 2 PUFFS INTO THE LUNGS EVERY 6 HOURS AS NEEDED FOR WHEEZING OR SHORTNESS OF BREATH (NEED MD APPOINTMENT) 3 each 0  . bisoprolol (ZEBETA) 10 MG tablet Take 1 tablet (10 mg total) by mouth in the morning and at bedtime. 180 tablet 0  . budesonide-formoterol (SYMBICORT) 80-4.5 MCG/ACT inhaler Inhale 2 puffs into the lungs 2 (two) times daily. 3 Inhaler 3  . desonide (DESOWEN) 0.05 % lotion Apply topically 2 (two) times daily. Continue following with dermatologist 59 mL 0  . dexamethasone (DECADRON) 4 MG tablet TAKE 2 TABLETS BY MOUTH 2 TIMES DAILY THE DAY BEFORE TAXOTERE THEN ONCE DAILY FOR 3 DAYS AFTER CARBOPLATIN CHEMO 30 tablet 1  . dextromethorphan (DELSYM) 30 MG/5ML liquid Take by mouth as needed for cough.     . hydrochlorothiazide (HYDRODIURIL) 25 MG tablet Take 25 mg by mouth daily.    . hydrocortisone 2.5 % cream Apply 1 application topically 2 (two) times daily.    Marland Kitchen loratadine (CLARITIN) 10 MG tablet Take 10 mg by mouth daily as needed for allergies.    Marland Kitchen morphine (MSIR) 15 MG tablet TAKE 1 TABLET BY MOUTH EVERY 4 HOURS AS NEEDED FOR SEVERE PAIN 90 tablet 0  . ondansetron (ZOFRAN) 8 MG tablet Take 1 tablet (8 mg total) by mouth every 8 (eight) hours as needed for nausea. 30 tablet 3  . pantoprazole (PROTONIX) 40 MG tablet Take 1 tablet (40 mg total) by mouth daily. Patient needs appt for further refills. 90 tablet 0  . polyethylene glycol (MIRALAX / GLYCOLAX) 17 g packet Take 17 g by mouth daily.     . predniSONE (DELTASONE) 10 MG tablet Take 0.5 tablets (5 mg total) by mouth daily with breakfast. Patient needs appt for further refills 90 tablet 0  . prochlorperazine (COMPAZINE) 10 MG tablet TAKE 1 TABLET BY MOUTH EVERY 6 HOURS AS NEEDED FOR NAUSEA OR VOMITING 60 tablet 3  . RABEprazole (ACIPHEX) 20 MG tablet Take 1 tablet (20 mg total) by mouth daily before breakfast. 90 tablet 3  . spironolactone (ALDACTONE) 25 MG tablet TAKE 1 TABLET BY MOUTH DAILY. 30 tablet 1  . vitamin C (ASCORBIC ACID) 500 MG tablet Take 500 mg by mouth daily.     No current facility-administered medications for this visit.   Facility-Administered Medications Ordered in Other Visits  Medication Dose Route Frequency Provider Last Rate Last Admin  . CARBOplatin (PARAPLATIN) 490 mg in sodium chloride 0.9 % 250 mL chemo infusion  490 mg Intravenous Once Alvy Bimler, Edrick Whitehorn, MD      . DOCEtaxel (TAXOTERE) 130 mg in sodium chloride 0.9 % 250 mL chemo infusion  60 mg/m2 (Treatment Plan Recorded) Intravenous Once Alvy Bimler, Brinlee Gambrell, MD 263 mL/hr at 05/01/21 1446 130 mg at 05/01/21 1446  . heparin lock flush 100 unit/mL  500 Units Intracatheter Once PRN Schneider Warchol, MD      . sodium chloride flush (NS) 0.9 % injection 10 mL  10 mL Intracatheter PRN [name], MD        PHYSICAL EXAMINATION:  ECOG PERFORMANCE STATUS: 1 - Symptomatic but completely ambulatory  Vitals:   05/01/21 1159  BP: (!) 178/87  Pulse: 80  Resp: 18  Temp: 97.8 F (36.6 C)  SpO2: 100%   Filed Weights   05/01/21 1159  Weight: 224 lb 6.4 oz (101.8 kg)    GENERAL:alert, no distress and comfortable SKIN: skin color, texture, turgor are normal, no rashes or significant lesions EYES: normal, Conjunctiva are pink and non-injected, sclera clear OROPHARYNX:no exudate, no erythema and lips, buccal mucosa, and tongue normal  NECK: supple, thyroid normal size, non-tender, without nodularity LYMPH:  no palpable lymphadenopathy in the cervical, axillary or  inguinal LUNGS: clear to auscultation and percussion with normal breathing effort HEART: regular rate & rhythm and no murmurs with mild bilateral lower extremity edema ABDOMEN:abdomen soft, non-tender and normal bowel sounds Musculoskeletal:no cyanosis of digits and no clubbing  NEURO: alert & oriented x 3 with fluent speech, no focal motor/sensory deficits  LABORATORY DATA:  I have reviewed the data as listed    Component Value Date/Time   NA 137 05/01/2021 1143   K 4.6 05/01/2021 1143   CL 104 05/01/2021 1143   CO2 23 05/01/2021 1143   GLUCOSE 229 (H) 05/01/2021 1143   BUN 25 (H) 05/01/2021 1143   CREATININE 0.95 05/01/2021 1143   CALCIUM 9.9 05/01/2021 1143   PROT 8.0 05/01/2021 1143   ALBUMIN 3.6 05/01/2021 1143   AST 14 (L) 05/01/2021 1143   ALT 17 05/01/2021 1143   ALKPHOS 102 05/01/2021 1143   BILITOT 0.4 05/01/2021 1143   GFRNONAA >60 05/01/2021 1143   GFRAA >60 09/08/2020 0819    No results found for: SPEP, UPEP  Lab Results  Component Value Date   WBC 7.6 05/01/2021   NEUTROABS 6.5 05/01/2021   HGB 8.2 (L) 05/01/2021   HCT 23.8 (L) 05/01/2021   MCV 101.3 (H) 05/01/2021   PLT 120 (L) 05/01/2021      Chemistry      Component Value Date/Time   NA 137 05/01/2021 1143   K 4.6 05/01/2021 1143   CL 104 05/01/2021 1143   CO2 23 05/01/2021 1143   BUN 25 (H) 05/01/2021 1143   CREATININE 0.95 05/01/2021 1143      Component Value Date/Time   CALCIUM 9.9 05/01/2021 1143   ALKPHOS 102 05/01/2021 1143   AST 14 (L) 05/01/2021 1143   ALT 17 05/01/2021 1143   BILITOT 0.4 05/01/2021 1143

## 2021-05-01 NOTE — Patient Instructions (Signed)
[facility] MEDICAL ONCOLOGY  Discharge Instructions: ?Thank you for choosing [facility] to provide your oncology and hematology care.  ? ?If you have a lab appointment with the Cancer Center, please go directly to the Cancer Center and check in at the registration area. ?  ?Wear comfortable clothing and clothing appropriate for easy access to any Portacath or PICC line.  ? ?We strive to give you quality time with your provider. You may need to reschedule your appointment if you arrive late (15 or more minutes).  Arriving late affects you and other patients whose appointments are after yours.  Also, if you miss three or more appointments without notifying the office, you may be dismissed from the clinic at the provider?s discretion.    ?  ?For prescription refill requests, have your pharmacy contact our office and allow 72 hours for refills to be completed.   ? ?Today you received the following chemotherapy and/or immunotherapy agents: Taxol, carboplatin    ?  ?To help prevent nausea and vomiting after your treatment, we encourage you to take your nausea medication as directed. ? ?BELOW ARE SYMPTOMS THAT SHOULD BE REPORTED IMMEDIATELY: ?*FEVER GREATER THAN 100.4 F (38 ?C) OR HIGHER ?*CHILLS OR SWEATING ?*NAUSEA AND VOMITING THAT IS NOT CONTROLLED WITH YOUR NAUSEA MEDICATION ?*UNUSUAL SHORTNESS OF BREATH ?*UNUSUAL BRUISING OR BLEEDING ?*URINARY PROBLEMS (pain or burning when urinating, or frequent urination) ?*BOWEL PROBLEMS (unusual diarrhea, constipation, pain near the anus) ?TENDERNESS IN MOUTH AND THROAT WITH OR WITHOUT PRESENCE OF ULCERS (sore throat, sores in mouth, or a toothache) ?UNUSUAL RASH, SWELLING OR PAIN  ?UNUSUAL VAGINAL DISCHARGE OR ITCHING  ? ?Items with * indicate a potential emergency and should be followed up as soon as possible or go to the Emergency Department if any problems should occur. ? ?Please show the CHEMOTHERAPY ALERT CARD or IMMUNOTHERAPY ALERT CARD at  check-in to the Emergency Department and triage nurse. ? ?Should you have questions after your visit or need to cancel or reschedule your appointment, please contact [facility]  Dept: [phone]  and follow the prompts.  Office hours are 8:00 a.m. to 4:30 p.m. Monday - Friday. Please note that voicemails left after 4:00 p.m. may not be returned until the following business day.  We are closed weekends and major holidays. You have access to a nurse at all times for urgent questions. Please call the main number to the clinic Dept: [phone] and follow the prompts. ? ? ?For any non-urgent questions, you may also contact your provider using MyChart. We now offer e-Visits for anyone 18 and older to request care online for non-urgent symptoms. For details visit [email or web address]. ?  ?Also download the MyChart app! Go to the app store, search "MyChart", open the app, select [place], and log in with your MyChart username and password. ? ?Due to Covid, a mask is required upon entering the hospital/clinic. If you do not have a mask, one will be given to you upon arrival. For doctor visits, patients may have 1 support person aged 18 or older with them. For treatment visits, patients cannot have anyone with them due to current Covid guidelines and our immunocompromised population.  ? ?

## 2021-05-03 ENCOUNTER — Other Ambulatory Visit: Payer: Self-pay | Admitting: Internal Medicine

## 2021-05-07 ENCOUNTER — Ambulatory Visit: Payer: Medicare HMO | Admitting: Internal Medicine

## 2021-05-07 ENCOUNTER — Other Ambulatory Visit: Payer: Self-pay

## 2021-05-07 ENCOUNTER — Encounter: Payer: Self-pay | Admitting: Internal Medicine

## 2021-05-07 DIAGNOSIS — D869 Sarcoidosis, unspecified: Secondary | ICD-10-CM

## 2021-05-07 DIAGNOSIS — R058 Other specified cough: Secondary | ICD-10-CM

## 2021-05-07 NOTE — Progress Notes (Addendum)
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   Last worked  Prior to  [facility] surgery Nov 28 2017      History of Present Illness  Last ov 02/17/13 no evidence active sarcoid rec Aciphex 20 mg Take 30-60 min before first meal of the day also when coughing for any reason take Pepcid 20 mg one at bedtime  Take delsym two tsp every 12 hours and supplement if needed with  tramadol 50 mg up to 2 every 4 hours to suppress the urge to cough. Swallowing water or using ice chips/non mint and menthol containing candies (such as lifesavers or sugarless jolly ranchers) are also effective.  You should rest your voice and avoid activities that you know make you cough. Once you have eliminated the cough for 3 straight days try reducing the tramadol first,  then the delsym as tolerated.   GERD diet    01/28/2014  Pulmonary consultation / Joelle [facility]/ on no maint resp rx / rare baseline need for saba  Chief Complaint  Patient presents with  . Pulmonary Consult    Referred per Dr. Posey Pronto. [name] Cough is prod with moderate yellow sputum. She is using proair approx tid.  Onset was Jan 01 2014 while on aciphex with bfast with flu like illness but not follow contingency rx  On prednisone taper/ sp tamiflu/cough meds  Not limited by breathing from desired activities   rec Aciphex 20 mg Take 30-60 min before first meal of the day also when coughing for any reason take Pepcid 20 mg one at bedtime and chlortrimeton  4 mg at bedtime Take delsym two tsp every 12 hours and supplement if needed with  tramadol 50 mg up to 2 every 4 hours to suppress the urge to cough.   Once you have eliminated the cough for 3 straight days try reducing the tramadol first,  then the delsym as tolerated.   GERD diet         05/07/2017 acute extended ov/[name] re: cough > sob  Chief Complaint  Patient presents with  . Acute Visit    Cough not improving, non prod "feels like something in my throat". She is waking up in the night coughing.   was doing fine for months since last ov with me but not doing well as well since 02/18/17 when placed on lisinopril and gradually  downhill since on multple courses of prednisone and now symbicort (though very poor hfa) and much worse dry cough 24/7 no longer has med calendar and doesn't even recognize the copy we have / very confused with meds  rec Stop lotensin and start valasartan 325 mg one daily  Start amlodipine 10 mg daily  Work on inhaler technique:  albuterol as a rescue medication to be used if you can't catch your breath For cough use the flutter valve and Take delsym two tsp every 12 hours and supplement if needed with  tramadol 50 mg up to 2 every 4 hours to suppress the urge  to cough   Finish your prednisone Follow the med calendar daily and bring it back with you to all office visits   Surgery 09/23/17 [name] / [facility]:  - ANGIOKERATOMA.  10/21/2017  f/u ov/[name] re:  Sarcoid/ steroid dep / ocular involvmemnt  Chief Complaint  Patient presents with  . Follow-up    Increased cough x 2 wks- non prod. Her breathing is unchanged. She ran out of prednisone approx 2 wks ago and never had it refilled.   confused re details of care  ? 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 Airway involvement on pred 10 mg one half daily  Chief Complaint  Patient presents with  . Follow-up    Breathing is overall doing well and she denies any new co's today.   Dyspnea: not as active  Cough: none  Sleeping: 30 degrees fine  SABA use: none  02: none  rec Ok to try skipping a dose of prednisone  every other day to see what difference if any notice    05/07/2021  f/u ov/[name] re: sarcoid/ chemo for ovarian ca/ prednisone 10mg  one half qod  Chief Complaint  Patient presents with  . Follow-up    Doing ok.  Dyspnea:  No change / very sedentary/ mostly just tired Cough: none  Sleeping: 30 degree bed  SABA use: none  02: none  Covid status:   vax x 3 /  Plus had covid    No obvious day to day or daytime variability or assoc excess/ purulent sputum or mucus plugs or hemoptysis or cp or chest tightness, subjective wheeze or overt sinus or hb symptoms.   Sleeping as above  without nocturnal  or early am exacerbation  of respiratory  c/o's or need for noct saba. Also denies any obvious fluctuation of symptoms with weather or environmental changes or other aggravating or alleviating factors except as outlined above   No unusual exposure hx or h/o childhood pna/ asthma or knowledge of premature birth.  Current Allergies, Complete Past Medical History, Past Surgical History, Family History, and Social History were reviewed in [facility] record.  ROS  The following are not active complaints unless bolded Hoarseness, sore throat, dysphagia, dental problems, itching, sneezing,  nasal congestion or discharge of excess mucus or purulent secretions, ear ache,   fever, chills, sweats, unintended wt loss or wt gain, classically pleuritic or exertional cp,  orthopnea pnd or arm/hand swelling  or leg  swelling, presyncope, palpitations, abdominal pain, anorexia, nausea, vomiting, diarrhea  or change in bowel habits or change in bladder habits, change in stools or change in urine, dysuria, hematuria,  rash, arthralgias, visual complaints, headache, numbness, weakness or ataxia or problems with walking or coordination,  change in mood or  memory.        Current Meds  Medication Sig  . albuterol (VENTOLIN HFA) 108 (90 Base) MCG/ACT inhaler INHALE 2 PUFFS INTO THE LUNGS EVERY 6 HOURS AS NEEDED FOR WHEEZING OR SHORTNESS OF BREATH (NEED MD APPOINTMENT)  .  bisoprolol (ZEBETA) 10 MG tablet Take 1 tablet (10 mg total) by mouth in the morning and at bedtime.  . budesonide-formoterol (SYMBICORT) 80-4.5 MCG/ACT inhaler Inhale 2 puffs into the lungs 2 (two) times daily.  Marland Kitchen desonide (DESOWEN) 0.05 % lotion Apply topically 2 (two) times daily. Continue following with dermatologist  . dexamethasone (DECADRON) 4 MG tablet TAKE 2 TABLETS BY MOUTH 2 TIMES DAILY THE DAY BEFORE TAXOTERE THEN ONCE DAILY FOR 3 DAYS AFTER CARBOPLATIN CHEMO  . dextromethorphan (DELSYM) 30 MG/5ML liquid Take by mouth as needed for cough.   . hydrochlorothiazide (HYDRODIURIL) 25 MG tablet Take 25 mg by mouth daily.  . hydrocortisone 2.5 % cream Apply 1 application topically 2 (two) times daily.  Marland Kitchen loratadine (CLARITIN) 10 MG tablet Take 10 mg by mouth daily as needed for allergies.  Marland Kitchen morphine (MSIR) 15 MG tablet TAKE 1 TABLET BY MOUTH EVERY 4 HOURS AS NEEDED FOR SEVERE PAIN  . ondansetron (ZOFRAN) 8 MG tablet Take 1 tablet (8 mg total) by mouth every 8 (eight) hours as needed for nausea.  . pantoprazole (PROTONIX) 40 MG tablet Take 1 tablet (40 mg total) by mouth daily. Patient needs appt for further refills.  . polyethylene glycol (MIRALAX / GLYCOLAX) 17 g packet Take 17 g by mouth daily.  . predniSONE (DELTASONE) 10 MG tablet Take 0.5 tablets (5 mg total) by mouth daily with breakfast. Patient needs appt for further refills  .  prochlorperazine (COMPAZINE) 10 MG tablet TAKE 1 TABLET BY MOUTH EVERY 6 HOURS AS NEEDED FOR NAUSEA OR VOMITING  . RABEprazole (ACIPHEX) 20 MG tablet Take 1 tablet (20 mg total) by mouth daily before breakfast.  . spironolactone (ALDACTONE) 25 MG tablet TAKE 1 TABLET BY MOUTH DAILY.  . vitamin C (ASCORBIC ACID) 500 MG tablet Take 500 mg by mouth daily.                       Objective:   Physical Exam     05/07/2021     218 11/07/2020    221 09/30/2019    233  06/29/2019      231 01/25/2019      228   01/14/2019      229  10/23/2018      229  09/17/2018      227  06/16/2018        225  10/21/2017      224  07/07/2017      223  05/22/2017         219  05/07/2017       222  08/22/2016         216   05/17/2016        215   05/02/15 219 lb (99.338 kg)  04/07/15 219 lb 9.6 oz (99.61 kg)  03/24/15 216 lb (97.977 kg)     Vital signs reviewed  05/07/2021  - Note at rest 02 sats  98% on RA   General appearance:    Amb pleasant obese bf nad     HEENT : pt wearing mask not removed for exam due to covid -19 concerns.    NECK :  without JVD/Nodes/TM/ nl carotid upstrokes bilaterally   LUNGS: no acc muscle use,  Nl contour chest with a few insp crackles in bases  bilaterally without cough on insp or exp maneuvers   CV:  RRR  no s3 or murmur or increase in P2, and no edema   ABD:  soft  and nontender with nl inspiratory excursion in the supine position. No bruits or organomegaly appreciated, bowel sounds nl  MS:  Nl gait/ ext warm without deformities, calf tenderness, cyanosis or clubbing No obvious joint restrictions   SKIN: warm and dry without lesions    NEURO:  alert, approp, nl sensorium with  no motor or cerebellar deficits apparent.            I personally reviewed images and agree with radiology impression as follows:   Chest CT w contrast 04/17/21 resolution of some previously seen nodules. 2. Interval decrease in size of enlarged retroperitoneal lymph nodes. 3. Interval  decrease in size of soft tissue at the left aspect of the vaginal cuff status post hysterectomy. 4. Interval decrease in size of a soft tissue nodule within the posterior aspect of the right rectus abdominus muscle body. 5. Constellation of findings is consistent with treatment response of locally recurrent, nodal, and distant metastatic disease. 6. There has been interval resolution of previously seen left hydronephrosis and hydroureter. 7. 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             Assessment & Plan:

## 2021-05-07 NOTE — Patient Instructions (Signed)
Ok to change prednisone to 10 mg  One half daily until thru chemo then try again to reduce the dose  To one every other day   Please schedule a follow up visit in  6 months but call sooner if needed

## 2021-05-08 ENCOUNTER — Encounter: Payer: Self-pay | Admitting: Internal Medicine

## 2021-05-08 NOTE — Assessment & Plan Note (Signed)
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 05/02/2015 >>> - flare May 2017 p stopped aciphex > rec restart 05/17/2016  - flare since 02/2017 placed on lisinopril with no airflow obst 05/07/2017 by spirometry > d/c ACEi  Permanently  - improved 07/07/2017 but not on gerd rx > rec resume full gerd rx/ stay off acei  - 06/29/2019  After extensive coaching inhaler device,  effectiveness =    75% short ti but can't afford dulera 100 so change to symb 80> cough resolved as of 09/30/2019   Adequate control on present rx, reviewed in detail with pt > no change in rx needed

## 2021-05-08 NOTE — Assessment & Plan Note (Addendum)
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  - Chronic pred rx 2/09> 04/2008  - Restart Pred December 26, 2008 > June 12, 2009 stopped  > restarted 07/11/11>>pt stopped 10/15/11  - PFT's March 23, 2010 VC 1.77 (66%) no obst, ERV 34%, DLCO 53 > 115% corrected - PFT's 09/30/12       VC 2.02 (57%) ratio 78 , erv 13%  DLCO 55 > 118% corrected - Skin bx 05/19/14 bridge of nose > Pos NCG - 05/02/2015  Walked RA x 3 laps @ 185 ft each stopped due to end of study, no sob/ no desat at nl pace/ no cough provoked  - PFT's  08/22/2016 VC  1.81 ratio 82  with DLCO  59/55 % corrects to 88 % for alv volume  - Spirometry 05/07/2017   FVC  1.81  With no obst by fev1/fvc and no significant curvature  - 05/07/2017  After extensive coaching HFA effectiveness =    75% from a baseline of < 25%  - 05/22/2017   Walked RA x one lap @ 185 stopped due to  Sob at fast pace with sats 96% - 09/09/17 placed on floor dose of 5 mg daily and flared late Oct 2018 when ran out of rx  - Spirometry 06/16/2018  FVC  1.82 (62%) s obst  - 06/16/2018   Walked RA x one lap @ 185 stopped due to  Sob, mod fast pace, no desats  - 09/17/2018   Walked RA x one lap @ 185 stopped due to  Sob/ no desat on ? Dose prednisone with ESR 74 so rec pred 20 ub then taper to 5 mg   - 01/14/2019    try dulera 100 2bid x 2 week sample then return in 2 weeks to regroup/ count use > only 20 puffs gone, confused with 2bid instruction > 06/29/2019 some better on duera 100 but can't afford it > changed to symb 80  - 06/29/2019   Walked RA x one lap =  approx 250 ft - stopped due to  Sob avg pace, no desats - 09/30/2019   Walked RA  2 laps @  approx 228ft each @ moderate pace  stopped due to fatigue with sats still 91%  - 11/07/2020 rec reduce prednisone to 5 mg qod    >>> no active dz apparent now but since on chemo and feeling very tired rec 5 mg qd until completes it then back to $Remov'5mg'CFIMIz$  qod dosing using the "lowest dose tolerated" approach here  Discussed in detail all the  indications, usual  risks and  alternatives  relative to the benefits with patient who agrees to proceed with Rx as outlined.              Each maintenance medication was reviewed in detail including emphasizing most importantly the difference between maintenance and prns and under what circumstances the prns are to be triggered using an action plan format where appropriate.  Total time for H and P, chart review, counseling,  and generating customized AVS unique to this office visit / same day charting = 23 min

## 2021-05-21 ENCOUNTER — Inpatient Hospital Stay: Payer: Medicare HMO | Attending: Hematology and Oncology

## 2021-05-21 ENCOUNTER — Inpatient Hospital Stay: Payer: Medicare HMO

## 2021-05-21 ENCOUNTER — Inpatient Hospital Stay: Payer: Medicare HMO | Admitting: Hematology and Oncology

## 2021-05-21 ENCOUNTER — Encounter: Payer: Self-pay | Admitting: Hematology and Oncology

## 2021-05-21 ENCOUNTER — Other Ambulatory Visit: Payer: Self-pay | Admitting: Hematology and Oncology

## 2021-05-21 ENCOUNTER — Other Ambulatory Visit: Payer: Self-pay

## 2021-05-21 VITALS — BP 157/88

## 2021-05-21 DIAGNOSIS — Z5111 Encounter for antineoplastic chemotherapy: Secondary | ICD-10-CM | POA: Diagnosis not present

## 2021-05-21 DIAGNOSIS — D6481 Anemia due to antineoplastic chemotherapy: Secondary | ICD-10-CM

## 2021-05-21 DIAGNOSIS — G620 Drug-induced polyneuropathy: Secondary | ICD-10-CM

## 2021-05-21 DIAGNOSIS — C541 Malignant neoplasm of endometrium: Secondary | ICD-10-CM | POA: Insufficient documentation

## 2021-05-21 DIAGNOSIS — C775 Secondary and unspecified malignant neoplasm of intrapelvic lymph nodes: Secondary | ICD-10-CM

## 2021-05-21 DIAGNOSIS — R600 Localized edema: Secondary | ICD-10-CM | POA: Diagnosis not present

## 2021-05-21 DIAGNOSIS — Z7952 Long term (current) use of systemic steroids: Secondary | ICD-10-CM | POA: Diagnosis not present

## 2021-05-21 DIAGNOSIS — C7802 Secondary malignant neoplasm of left lung: Secondary | ICD-10-CM | POA: Insufficient documentation

## 2021-05-21 DIAGNOSIS — K449 Diaphragmatic hernia without obstruction or gangrene: Secondary | ICD-10-CM | POA: Diagnosis not present

## 2021-05-21 DIAGNOSIS — T451X5A Adverse effect of antineoplastic and immunosuppressive drugs, initial encounter: Secondary | ICD-10-CM | POA: Insufficient documentation

## 2021-05-21 DIAGNOSIS — R5383 Other fatigue: Secondary | ICD-10-CM | POA: Diagnosis not present

## 2021-05-21 DIAGNOSIS — Z880 Allergy status to penicillin: Secondary | ICD-10-CM | POA: Diagnosis not present

## 2021-05-21 DIAGNOSIS — D869 Sarcoidosis, unspecified: Secondary | ICD-10-CM | POA: Diagnosis not present

## 2021-05-21 DIAGNOSIS — D61818 Other pancytopenia: Secondary | ICD-10-CM | POA: Diagnosis not present

## 2021-05-21 DIAGNOSIS — Z79899 Other long term (current) drug therapy: Secondary | ICD-10-CM | POA: Diagnosis not present

## 2021-05-21 LAB — CMP (CANCER CENTER ONLY)
ALT: 17 U/L (ref 0–44)
AST: 16 U/L (ref 15–41)
Albumin: 3.8 g/dL (ref 3.5–5.0)
Alkaline Phosphatase: 83 U/L (ref 38–126)
Anion gap: 12 (ref 5–15)
BUN: 25 mg/dL — ABNORMAL HIGH (ref 8–23)
CO2: 20 mmol/L — ABNORMAL LOW (ref 22–32)
Calcium: 9.6 mg/dL (ref 8.9–10.3)
Chloride: 106 mmol/L (ref 98–111)
Creatinine: 1.08 mg/dL — ABNORMAL HIGH (ref 0.44–1.00)
GFR, Estimated: 58 mL/min — ABNORMAL LOW (ref >60)
Glucose, Bld: 207 mg/dL — ABNORMAL HIGH (ref 70–99)
Potassium: 5 mmol/L (ref 3.5–5.1)
Sodium: 138 mmol/L (ref 135–145)
Total Bilirubin: 0.4 mg/dL (ref 0.3–1.2)
Total Protein: 8.1 g/dL (ref 6.5–8.1)

## 2021-05-21 LAB — CBC WITH DIFFERENTIAL (CANCER CENTER ONLY)
Abs Immature Granulocytes: 0.14 10*3/uL — ABNORMAL HIGH (ref 0.00–0.07)
Basophils Absolute: 0 10*3/uL (ref 0.0–0.1)
Basophils Relative: 0 %
Eosinophils Absolute: 0 10*3/uL (ref 0.0–0.5)
Eosinophils Relative: 0 %
HCT: 25 % — ABNORMAL LOW (ref 36.0–46.0)
Hemoglobin: 8.3 g/dL — ABNORMAL LOW (ref 12.0–15.0)
Immature Granulocytes: 2 %
Lymphocytes Relative: 9 %
Lymphs Abs: 0.6 10*3/uL — ABNORMAL LOW (ref 0.7–4.0)
MCH: 35.3 pg — ABNORMAL HIGH (ref 26.0–34.0)
MCHC: 33.2 g/dL (ref 30.0–36.0)
MCV: 106.4 fL — ABNORMAL HIGH (ref 80.0–100.0)
Monocytes Absolute: 0.2 10*3/uL (ref 0.1–1.0)
Monocytes Relative: 3 %
Neutro Abs: 5.8 10*3/uL (ref 1.7–7.7)
Neutrophils Relative %: 86 %
Platelet Count: 141 10*3/uL — ABNORMAL LOW (ref 150–400)
RBC: 2.35 MIL/uL — ABNORMAL LOW (ref 3.87–5.11)
RDW: 21.2 % — ABNORMAL HIGH (ref 11.5–15.5)
WBC Count: 6.7 10*3/uL (ref 4.0–10.5)
nRBC: 1.2 % — ABNORMAL HIGH (ref 0.0–0.2)

## 2021-05-21 LAB — SAMPLE TO BLOOD BANK

## 2021-05-21 MED ORDER — HEPARIN SOD (PORK) LOCK FLUSH 100 UNIT/ML IV SOLN
500.0000 [IU] | Freq: Once | INTRAVENOUS | Status: AC | PRN
  Administered 2021-05-21: 500 [IU]
  Filled 2021-05-21: qty 5

## 2021-05-21 MED ORDER — SODIUM CHLORIDE 0.9 % IV SOLN
445.2000 mg | Freq: Once | INTRAVENOUS | Status: AC
  Administered 2021-05-21: 450 mg via INTRAVENOUS
  Filled 2021-05-21: qty 45

## 2021-05-21 MED ORDER — DIPHENHYDRAMINE HCL 50 MG/ML IJ SOLN
INTRAMUSCULAR | Status: AC
  Filled 2021-05-21: qty 1

## 2021-05-21 MED ORDER — PALONOSETRON HCL INJECTION 0.25 MG/5ML
INTRAVENOUS | Status: AC
  Filled 2021-05-21: qty 5

## 2021-05-21 MED ORDER — SODIUM CHLORIDE 0.9% FLUSH
10.0000 mL | INTRAVENOUS | Status: DC | PRN
  Administered 2021-05-21: 10 mL
  Filled 2021-05-21: qty 10

## 2021-05-21 MED ORDER — PREDNISONE 10 MG PO TABS: 10.0000 mg | ORAL_TABLET | Freq: Every day | ORAL | 0 refills | Status: DC

## 2021-05-21 MED ORDER — DEXAMETHASONE SODIUM PHOSPHATE 10 MG/ML IJ SOLN
5.0000 mg | Freq: Once | INTRAMUSCULAR | Status: AC
  Administered 2021-05-21: 5 mg via INTRAVENOUS

## 2021-05-21 MED ORDER — FAMOTIDINE 20 MG IN NS 100 ML IVPB
20.0000 mg | Freq: Once | INTRAVENOUS | Status: AC
  Administered 2021-05-21: 20 mg via INTRAVENOUS

## 2021-05-21 MED ORDER — PALONOSETRON HCL INJECTION 0.25 MG/5ML
0.2500 mg | Freq: Once | INTRAVENOUS | Status: AC
  Administered 2021-05-21: 0.25 mg via INTRAVENOUS

## 2021-05-21 MED ORDER — DEXAMETHASONE SODIUM PHOSPHATE 10 MG/ML IJ SOLN
INTRAMUSCULAR | Status: AC
  Filled 2021-05-21: qty 1

## 2021-05-21 MED ORDER — FAMOTIDINE 20 MG IN NS 100 ML IVPB
INTRAVENOUS | Status: AC
  Filled 2021-05-21: qty 100

## 2021-05-21 MED ORDER — SODIUM CHLORIDE 0.9 % IV SOLN
60.0000 mg/m2 | Freq: Once | INTRAVENOUS | Status: AC
  Administered 2021-05-21: 130 mg via INTRAVENOUS
  Filled 2021-05-21: qty 13

## 2021-05-21 MED ORDER — SODIUM CHLORIDE 0.9 % IV SOLN
Freq: Once | INTRAVENOUS | Status: AC
  Filled 2021-05-21: qty 250

## 2021-05-21 MED ORDER — DIPHENHYDRAMINE HCL 50 MG/ML IJ SOLN
25.0000 mg | Freq: Once | INTRAMUSCULAR | Status: AC
  Administered 2021-05-21: 25 mg via INTRAVENOUS

## 2021-05-21 MED ORDER — SODIUM CHLORIDE 0.9% FLUSH
10.0000 mL | Freq: Once | INTRAVENOUS | Status: AC
  Administered 2021-05-21: 10 mL
  Filled 2021-05-21: qty 10

## 2021-05-21 MED ORDER — SODIUM CHLORIDE 0.9 % IV SOLN
150.0000 mg | Freq: Once | INTRAVENOUS | Status: AC
  Administered 2021-05-21: 150 mg via INTRAVENOUS
  Filled 2021-05-21: qty 150

## 2021-05-21 NOTE — Patient Instructions (Signed)
[facility]  Discharge Instructions: Thank you for choosing [facility] to provide your oncology and hematology care.   If you have a lab appointment with the [facility], please go directly to the [facility] and check in at the registration area.   Wear comfortable clothing and clothing appropriate for easy access to any Portacath or PICC line.   We strive to give you quality time with your provider. You may need to reschedule your appointment if you arrive late (15 or more minutes).  Arriving late affects you and other patients whose appointments are after yours.  Also, if you miss three or more appointments without notifying the office, you may be dismissed from the clinic at the provider's discretion.      For prescription refill requests, have your pharmacy contact our office and allow 72 hours for refills to be completed.    Today you received the following chemotherapy and/or immunotherapy agents docetaxel, carboplatin     To help prevent nausea and vomiting after your treatment, we encourage you to take your nausea medication as directed.  BELOW ARE SYMPTOMS THAT SHOULD BE REPORTED IMMEDIATELY: . *FEVER GREATER THAN 100.4 F (38 C) OR HIGHER . *CHILLS OR SWEATING . *NAUSEA AND VOMITING THAT IS NOT CONTROLLED WITH YOUR NAUSEA MEDICATION . *UNUSUAL SHORTNESS OF BREATH . *UNUSUAL BRUISING OR BLEEDING . *URINARY PROBLEMS (pain or burning when urinating, or frequent urination) . *BOWEL PROBLEMS (unusual diarrhea, constipation, pain near the anus) . TENDERNESS IN MOUTH AND THROAT WITH OR WITHOUT PRESENCE OF ULCERS (sore throat, sores in mouth, or a toothache) . UNUSUAL RASH, SWELLING OR PAIN  . UNUSUAL VAGINAL DISCHARGE OR ITCHING   Items with * indicate a potential emergency and should be followed up as soon as possible or go to the Emergency Department if any problems should occur.  Please show the CHEMOTHERAPY ALERT CARD or  IMMUNOTHERAPY ALERT CARD at check-in to the Emergency Department and triage nurse.  Should you have questions after your visit or need to cancel or reschedule your appointment, please contact [facility]  Dept: [phone]  and follow the prompts.  Office hours are 8:00 a.m. to 4:30 p.m. Monday - Friday. Please note that voicemails left after 4:00 p.m. may not be returned until the following business day.  We are closed weekends and major holidays. You have access to a nurse at all times for urgent questions. Please call the main number to the clinic Dept: [phone] and follow the prompts.   For any non-urgent questions, you may also contact your provider using MyChart. We now offer e-Visits for anyone 64 and older to request care online for non-urgent symptoms. For details visit [email or web address].   Also download the MyChart app! Go to the app store, search "MyChart", open the app, select [facility], and log in with your MyChart username and password.  Due to Covid, a mask is required upon entering the hospital/clinic. If you do not have a mask, one will be given to you upon arrival. For doctor visits, patients may have 1 support person aged 26 or older with them. For treatment visits, patients cannot have anyone with them due to current Covid guidelines and our immunocompromised population.

## 2021-05-21 NOTE — Assessment & Plan Note (Signed)
Her leg swelling is stable This is likely due to chronic fluid retention from prednisone Continue close observation

## 2021-05-21 NOTE — Progress Notes (Signed)
[place] OFFICE PROGRESS NOTE  Patient Care Team: [name], [name], MD as PCP - General (Family Medicine)  ASSESSMENT & PLAN:  Endometrial cancer [facility]) Her recent imaging show good response to treatment The patient had progressive weight gain We discussed the importance of weight control while on treatment I plan to repeat imaging study again in August  Pancytopenia, acquired [facility]) She has severe pancytopenia due to treatment Previously, I have ordered vitamin B12 and iron studies and they were adequate The most likely cause is due to chemotherapy She does not need blood transfusion today With recent excellent response to treatment, I plan to continue on reduced dose carboplatin as before  Peripheral neuropathy due to chemotherapy [facility]) She denies worsening peripheral neuropathy We will continue at current dose of treatment  Bilateral lower extremity edema Her leg swelling is stable This is likely due to chronic fluid retention from prednisone Continue close observation   No orders of the defined types were placed in this encounter.   All questions were answered. The patient knows to call the clinic with any problems, questions or concerns. The total time spent in the appointment was 20 minutes encounter with patients including review of chart and various tests results, discussions about plan of care and coordination of care plan   [name], MD 05/21/2021 11:43 AM  INTERVAL HISTORY: Please see below for problem oriented charting. She returns for treatment and follow-up She returned from the beach She had a great time She had good energy level when she walked Her leg swelling is stable She denies worsening peripheral neuropathy from treatment  SUMMARY OF ONCOLOGIC HISTORY: Oncology History Overview Note  ER/PR neg, MSI stable   Endometrial cancer ([place])  06/15/2018 Surgery   Surgeon: Donaciano Eva     Operation: Robotic-assisted laparoscopic  total hysterectomy with bilateral salpingoophorectomy, SLN biopsy     Operative Findings:  : 6cm uterus, normal tubes and ovaries, omentum adherent to the anterior abdominal wall and uterine fundus. No suspicious nodes.   06/15/2018 Pathology Results   Procedure: Total hysterectomy with bilateral salpingo-oophorectomy. Bilateral obturator sentinel lymph node biopsies. Histologic type: Endometrioid adenocarcinoma. Histologic Grade: FIGO grade 1. Myometrial invasion: Depth of invasion: 15 mm Myometrial thickness: 20 mm Uterine Serosa Involvement: Not identified. Cervical stromal involvement: Not identified. Extent of involvement of other organs: Uninvolved. Lymphovascular invasion: Present. Regional Lymph Nodes: Examined: 2 Sentinel 0 Non-sentinel 2 Total Lymph nodes with metastasis: 0 Isolated tumor cells (< 0.2 mm): 2 Micrometastasis: (> 0.2 mm and < 2.0 mm): 0 Macrometastasis: (> 2.0 mm): 0 Extracapsular extension: N/A. Tumor block for ancillary studies: 4Q-S. MMR / MSI testing: Will be ordered. Pathologic Stage Classification (pTNM, AJCC 8th edition): pT1b, pN0(i+)   07/20/2018 Imaging   1. Surgical changes from recent hysterectomy. No findings suspicious for residual tumor or adenopathy in the pelvis. 2. No CT findings to suggest omental, peritoneal surface or solid organ metastatic disease in the abdomen. 3. Changes consistent with known sarcoidosis in the chest with bulky calcified mediastinal and hilar lymph nodes and interstitial lung disease. I do not see any definite CT findings to suggest metastatic disease involving the chest.   08/19/2018 - 09/17/2018 Radiation Therapy   08/19/18 - 09/17/18: Vaginal Cuff, 3 cm cylinder with treatment length of 3 cm / 30 Gy delivered in 5 fractions of 6 Gy, Brachytherapy / HDR, Iridium-192   05/2005: Left Chest Wall keloid   04/2004 - 05/2004: Anus, pelvis /inguinal ( Dr. Tammi Klippel)  01/19/2020 Imaging   1. No definite CT findings of the abdomen  or pelvis to explain pain. 2. The sigmoid colon and rectum are decompressed although slightly thickened appearing, suggestive of nonspecific infectious, inflammatory, or ischemic colitis. Correlate for referable clinical symptoms. 3. There is trace, nonspecific free fluid in the low pelvis, which may be reactive, although would be difficult to distinguish from postoperative and post treatment soft tissue change in the pelvis given history of hysterectomy and rectal cancer. 4. Unchanged prominent retroperitoneal and iliac lymph nodes. No new findings suspicious for metastatic disease in the abdomen or pelvis. 5. Bibasilar pulmonary fibrosis and mediastinal and hilar lymphadenopathy, better evaluated by prior CT chest and consistent with patient diagnosis of sarcoidosis.       04/18/2020 Imaging   Venous Doppler Left:  - No evidence of deep vein thrombosis seen in the left lower extremity, from the common femoral through the popliteal veins.  - No evidence of superficial venous thrombosis in the left lower extremity.  - There is no evidence of venous reflux seen in the left lower extremity.  - Rouleax flow noted in the superficial system.    05/18/2020 PET scan   1. Complex scan with a combination of hypermetabolic granulomatous disease (sarcoidosis) as well as hypermetabolic metastatic endometrial carcinoma. 2. Hypermetabolic mediastinal hilar lymph nodes and pulmonary nodularity with bronchovascular thickening is consistent with sarcoidosis. 3. Solitary hypermetabolic nodule in the medial LEFT lower lobe, new from prior with intense metabolic activity, is concerning for a metastatic deposit to the LEFT lower lobe. 4. Ill-defined hypermetabolic soft tissue along the LEFT operator space is most consistent with local recurrence of endometrial carcinoma. There is entrapment of the LEFT ureter by this hypermetabolic soft tissue with mild hydroureter and hydronephrosis on the LEFT. 5. Additionally there  appears to be direct extension from the hypermetabolic left operator space soft tissue to the sigmoid colon with there is a well-defined hypermetabolic lesion in the colon. 6. More distant hypermetabolic peritoneal metastasis along the ventral peritoneal surface of the RIGHT abdomen beneath RIGHT rectus abdominus muscle. 7. Hypermetabolic lymph nodes in the porta hepatis are favored related to sarcoidosis.   05/19/2020 Cancer Staging   Staging form: Corpus Uteri - Carcinoma and Carcinosarcoma, AJCC 8th Edition - Clinical stage from 05/19/2020: Stage IVB (rcT1, cN2a, cM1) - Signed by [name], MD on 05/19/2020   05/26/2020 Procedure   Successful placement of a right internal jugular approach power injectable Port-A-Cath. The catheter is ready for immediate use   05/29/2020 - 09/27/2020 Chemotherapy   The patient had carboplatin and taxol for chemotherapy treatment.     07/31/2020 Imaging   1. Asymmetric soft tissue mass extending from the left vaginal cuff has decreased in size in the interval. No residual peritoneal nodularity. Pulmonary metastases are stable with exception of a minimally improved left lower lobe nodule. 2. Stable soft tissue nodule associated with the right rectus abdominus muscle. 3. Interval resolution of left hydronephrosis. 4. Calcified mediastinal and hilar lymph nodes, together with pulmonary parenchymal fibrosis, findings which are in keeping with patient's known diagnosis of sarcoid. 5. Aortic atherosclerosis (ICD10-I70.0). Coronary artery calcification.   10/27/2020 Imaging   1. Essentially stable appearance of the soft tissue density nodule posteriorly in the left rectus abdominus muscle, currently 1.8 by 1.2 cm, previously 11 mm. 2. Stable calcified adenopathy in the mediastinum and hila, compatible with prior granulomatous disease. 3. Stable small right lower lobe pulmonary nodules. 4. Stable soft tissue density prominence along the left  vaginal cuff extending up  towards the adnexa, probably therapy related. 5. Other imaging findings of potential clinical significance: Coronary atherosclerosis. Small type 1 hiatal hernia. Stable subpleural reticulation favoring the lung bases, possibly from mild fibrosis. 6. Aortic atherosclerosis.   01/19/2021 Imaging   1. Status post hysterectomy. Interval enlargement of soft tissue adjacent to the left aspect of the vaginal cuff and bladder, measuring approximately 4.3 x 2.9 cm in axial section, previously previously previously 3.7 x 1.8 cm when measured similarly. Extensive soft tissue thickening and stranding throughout the low pelvis. 2. Interval enlargement of a metastatic nodule within the right rectus abdominus. New small peritoneal nodule anterior to the sigmoid colon. 3. Interval enlargement of retroperitoneal retroperitoneal lymph nodes. 4. Multiple small bilateral pulmonary nodules, several of which are new and enlarged. 5. Constellation of findings above is consistent with worsened metastatic disease. 6. New left hydronephrosis and hydroureter, the distal left ureter obstructed by soft tissue near the left aspect of the urinary bladder and vaginal cuff. 7. Unchanged enlarged, partially calcified mediastinal and hilar lymph nodes,, in keeping with prior granulomatous infection or sarcoidosis. 8. Mild underlying pulmonary fibrosis, in a UIP pattern. 9. Coronary artery disease.   02/02/2021 -  Chemotherapy    Patient is on Treatment Plan: OVARIAN CARBOPLATIN / DOCETAXEL Q21D      04/17/2021 Imaging   1. Interval decrease in size of multiple pulmonary nodules, with resolution of some previously seen nodules. 2. Interval decrease in size of enlarged retroperitoneal lymph nodes. 3. Interval decrease in size of soft tissue at the left aspect of the vaginal cuff status post hysterectomy. 4. Interval decrease in size of a soft tissue nodule within the posterior aspect of the right rectus abdominus muscle body. 5.  Constellation of findings is consistent with treatment response of locally recurrent, nodal, and distant metastatic disease. 6. There has been interval resolution of previously seen left hydronephrosis and hydroureter. 7. 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     02/02/2021 -  Chemotherapy    Patient is on Treatment Plan: OVARIAN CARBOPLATIN / DOCETAXEL Q21D        REVIEW OF SYSTEMS:   Constitutional: Denies fevers, chills or abnormal weight loss Eyes: Denies blurriness of vision Ears, nose, mouth, throat, and face: Denies mucositis or sore throat Respiratory: Denies cough, dyspnea or wheezes Cardiovascular: Denies palpitation, chest discomfort or lower extremity swelling Skin: Denies abnormal skin rashes Lymphatics: Denies new lymphadenopathy or easy bruising Behavioral/Psych: Mood is stable, no new changes  All other systems were reviewed with the patient and are negative.  I have reviewed the past medical history, past surgical history, social history and family history with the patient and they are unchanged from previous note.  ALLERGIES:  is allergic to shellfish allergy and penicillins.  MEDICATIONS:  Current Outpatient Medications  Medication Sig Dispense Refill  . albuterol (VENTOLIN HFA) 108 (90 Base)  MCG/ACT inhaler INHALE 2 PUFFS INTO THE LUNGS EVERY  6 HOURS AS NEEDED FOR WHEEZING OR SHORTNESS OF BREATH (NEED MD APPOINTMENT) 3 each 0  . bisoprolol (ZEBETA) 10 MG tablet Take 1 tablet (10 mg total) by mouth in the morning and at bedtime. 180 tablet 0  . budesonide-formoterol (SYMBICORT) 80-4.5 MCG/ACT inhaler Inhale 2 puffs into the lungs 2 (two) times daily. 3 Inhaler 3  . desonide (DESOWEN) 0.05 % lotion Apply topically 2 (two) times daily. Continue following with dermatologist 59 mL 0  . dexamethasone (DECADRON) 4 MG tablet TAKE 2 TABLETS BY MOUTH 2 TIMES DAILY THE DAY BEFORE TAXOTERE THEN ONCE DAILY FOR 3 DAYS AFTER CARBOPLATIN CHEMO 30 tablet 1  . dextromethorphan (DELSYM) 30 MG/5ML liquid Take by mouth as needed for cough.     . hydrochlorothiazide (HYDRODIURIL) 25 MG tablet Take 25 mg by mouth daily.    . hydrocortisone 2.5 % cream Apply 1 application topically 2 (two) times daily.    Marland Kitchen loratadine (CLARITIN) 10 MG tablet Take 10 mg by mouth daily as needed for allergies.    Marland Kitchen morphine (MSIR) 15 MG tablet TAKE 1 TABLET BY MOUTH EVERY 4 HOURS AS NEEDED FOR SEVERE PAIN 90 tablet 0  . ondansetron (ZOFRAN) 8 MG tablet Take 1 tablet (8 mg total) by mouth every 8 (eight) hours as needed for nausea. 30 tablet 3  . pantoprazole (PROTONIX) 40 MG tablet Take 1 tablet (40 mg total) by mouth daily. Patient needs appt for further refills. 90 tablet 0  . polyethylene glycol (MIRALAX / GLYCOLAX) 17 g packet Take 17 g by mouth daily.    . predniSONE (DELTASONE) 10 MG tablet Take 1 tablet (10 mg total) by mouth daily with breakfast. Patient needs appt for further refills 90 tablet 0  . prochlorperazine (COMPAZINE) 10 MG tablet TAKE 1 TABLET BY MOUTH EVERY 6 HOURS AS NEEDED FOR NAUSEA OR VOMITING 60 tablet 3  . RABEprazole (ACIPHEX) 20 MG tablet Take 1 tablet (20 mg total) by mouth daily before breakfast. 90 tablet 3  . spironolactone (ALDACTONE) 25 MG tablet TAKE 1 TABLET BY MOUTH DAILY. 30 tablet 1  . vitamin C (ASCORBIC ACID) 500 MG tablet Take 500 mg by  mouth daily.     No current facility-administered medications for this visit.    PHYSICAL EXAMINATION: ECOG PERFORMANCE STATUS: 1 - Symptomatic but completely ambulatory  Vitals:   05/21/21 1135  BP: (!) 171/90  Pulse: 89  Resp: 18  Temp: 99 F (37.2 C)  SpO2: 98%   Filed Weights   05/21/21 1135  Weight: 223 lb 3.2 oz (101.2 kg)    GENERAL:alert, no distress and comfortable SKIN: skin color, texture, turgor are normal, no rashes or significant lesions EYES: normal, Conjunctiva are pink and non-injected, sclera clear OROPHARYNX:no exudate, no erythema and lips, buccal mucosa, and tongue normal  NECK: supple, thyroid normal size, non-tender, without nodularity LYMPH:  no palpable lymphadenopathy in the cervical, axillary or inguinal LUNGS: clear to auscultation and percussion with normal breathing effort HEART: regular rate & rhythm and no murmurs with moderate bilateral lower extremity edema ABDOMEN:abdomen soft, non-tender and normal bowel sounds Musculoskeletal:no cyanosis of digits and no clubbing  NEURO: alert & oriented x 3 with fluent speech, no focal motor/sensory deficits  LABORATORY DATA:  I have reviewed the data as listed    Component Value Date/Time   NA 137 05/01/2021 1143   K 4.6 05/01/2021 1143   CL 104 05/01/2021 1143   CO2  23 05/01/2021 1143   GLUCOSE 229 (H) 05/01/2021 1143   BUN 25 (H) 05/01/2021 1143   CREATININE 0.95 05/01/2021 1143   CALCIUM 9.9 05/01/2021 1143   PROT 8.0 05/01/2021 1143   ALBUMIN 3.6 05/01/2021 1143   AST 14 (L) 05/01/2021 1143   ALT 17 05/01/2021 1143   ALKPHOS 102 05/01/2021 1143   BILITOT 0.4 05/01/2021 1143   GFRNONAA >60 05/01/2021 1143   GFRAA >60 09/08/2020 0819    No results found for: SPEP, UPEP  Lab Results  Component Value Date   WBC 6.7 05/21/2021   NEUTROABS 5.8 05/21/2021   HGB 8.3 (L) 05/21/2021   HCT 25.0 (L) 05/21/2021   MCV 106.4 (H) 05/21/2021   PLT 141 (L) 05/21/2021      Chemistry       Component Value Date/Time   NA 137 05/01/2021 1143   K 4.6 05/01/2021 1143   CL 104 05/01/2021 1143   CO2 23 05/01/2021 1143   BUN 25 (H) 05/01/2021 1143   CREATININE 0.95 05/01/2021 1143      Component Value Date/Time   CALCIUM 9.9 05/01/2021 1143   ALKPHOS 102 05/01/2021 1143   AST 14 (L) 05/01/2021 1143   ALT 17 05/01/2021 1143   BILITOT 0.4 05/01/2021 1143

## 2021-05-21 NOTE — Assessment & Plan Note (Signed)
She has severe pancytopenia due to treatment Previously, I have ordered vitamin B12 and iron studies and they were adequate The most likely cause is due to chemotherapy She does not need blood transfusion today With recent excellent response to treatment, I plan to continue on reduced dose carboplatin as before

## 2021-05-21 NOTE — Assessment & Plan Note (Signed)
She denies worsening peripheral neuropathy We will continue at current dose of treatment

## 2021-05-21 NOTE — Assessment & Plan Note (Signed)
Her recent imaging show good response to treatment The patient had progressive weight gain We discussed the importance of weight control while on treatment I plan to repeat imaging study again in August

## 2021-06-12 ENCOUNTER — Inpatient Hospital Stay: Payer: Medicare HMO

## 2021-06-12 ENCOUNTER — Inpatient Hospital Stay (HOSPITAL_BASED_OUTPATIENT_CLINIC_OR_DEPARTMENT_OTHER): Payer: Medicare HMO | Admitting: Hematology and Oncology

## 2021-06-12 ENCOUNTER — Encounter: Payer: Self-pay | Admitting: Hematology and Oncology

## 2021-06-12 ENCOUNTER — Telehealth: Payer: Self-pay | Admitting: Hematology and Oncology

## 2021-06-12 ENCOUNTER — Other Ambulatory Visit: Payer: Self-pay | Admitting: Hematology and Oncology

## 2021-06-12 ENCOUNTER — Other Ambulatory Visit: Payer: Self-pay

## 2021-06-12 VITALS — BP 188/89 | HR 86 | Temp 99.1°F | Resp 18 | Ht 65.5 in | Wt 226.8 lb

## 2021-06-12 VITALS — BP 184/85 | HR 75 | Temp 98.2°F | Resp 16

## 2021-06-12 DIAGNOSIS — Z5111 Encounter for antineoplastic chemotherapy: Secondary | ICD-10-CM | POA: Diagnosis not present

## 2021-06-12 DIAGNOSIS — D61818 Other pancytopenia: Secondary | ICD-10-CM | POA: Diagnosis not present

## 2021-06-12 DIAGNOSIS — D6481 Anemia due to antineoplastic chemotherapy: Secondary | ICD-10-CM

## 2021-06-12 DIAGNOSIS — C541 Malignant neoplasm of endometrium: Secondary | ICD-10-CM

## 2021-06-12 DIAGNOSIS — G620 Drug-induced polyneuropathy: Secondary | ICD-10-CM | POA: Diagnosis not present

## 2021-06-12 DIAGNOSIS — C775 Secondary and unspecified malignant neoplasm of intrapelvic lymph nodes: Secondary | ICD-10-CM | POA: Diagnosis not present

## 2021-06-12 DIAGNOSIS — C7802 Secondary malignant neoplasm of left lung: Secondary | ICD-10-CM

## 2021-06-12 DIAGNOSIS — D869 Sarcoidosis, unspecified: Secondary | ICD-10-CM | POA: Diagnosis not present

## 2021-06-12 DIAGNOSIS — T451X5A Adverse effect of antineoplastic and immunosuppressive drugs, initial encounter: Secondary | ICD-10-CM

## 2021-06-12 LAB — CMP (CANCER CENTER ONLY)
ALT: 19 U/L (ref 0–44)
AST: 18 U/L (ref 15–41)
Albumin: 3.5 g/dL (ref 3.5–5.0)
Alkaline Phosphatase: 95 U/L (ref 38–126)
Anion gap: 12 (ref 5–15)
BUN: 19 mg/dL (ref 8–23)
CO2: 23 mmol/L (ref 22–32)
Calcium: 9.4 mg/dL (ref 8.9–10.3)
Chloride: 107 mmol/L (ref 98–111)
Creatinine: 1 mg/dL (ref 0.44–1.00)
GFR, Estimated: >60 mL/min (ref >60)
Glucose, Bld: 165 mg/dL — ABNORMAL HIGH (ref 70–99)
Potassium: 4.7 mmol/L (ref 3.5–5.1)
Sodium: 142 mmol/L (ref 135–145)
Total Bilirubin: 0.6 mg/dL (ref 0.3–1.2)
Total Protein: 7.6 g/dL (ref 6.5–8.1)

## 2021-06-12 LAB — CBC WITH DIFFERENTIAL (CANCER CENTER ONLY)
Abs Immature Granulocytes: 0.03 10*3/uL (ref 0.00–0.07)
Basophils Absolute: 0 10*3/uL (ref 0.0–0.1)
Basophils Relative: 0 %
Eosinophils Absolute: 0 10*3/uL (ref 0.0–0.5)
Eosinophils Relative: 0 %
HCT: 22.8 % — ABNORMAL LOW (ref 36.0–46.0)
Hemoglobin: 7.6 g/dL — ABNORMAL LOW (ref 12.0–15.0)
Immature Granulocytes: 1 %
Lymphocytes Relative: 12 %
Lymphs Abs: 0.6 10*3/uL — ABNORMAL LOW (ref 0.7–4.0)
MCH: 35.3 pg — ABNORMAL HIGH (ref 26.0–34.0)
MCHC: 33.3 g/dL (ref 30.0–36.0)
MCV: 106 fL — ABNORMAL HIGH (ref 80.0–100.0)
Monocytes Absolute: 0.1 10*3/uL (ref 0.1–1.0)
Monocytes Relative: 2 %
Neutro Abs: 4.3 10*3/uL (ref 1.7–7.7)
Neutrophils Relative %: 85 %
Platelet Count: 112 10*3/uL — ABNORMAL LOW (ref 150–400)
RBC: 2.15 MIL/uL — ABNORMAL LOW (ref 3.87–5.11)
RDW: 19.9 % — ABNORMAL HIGH (ref 11.5–15.5)
WBC Count: 5 10*3/uL (ref 4.0–10.5)
nRBC: 0 % (ref 0.0–0.2)

## 2021-06-12 LAB — SAMPLE TO BLOOD BANK

## 2021-06-12 LAB — PREPARE RBC (CROSSMATCH)

## 2021-06-12 MED ORDER — DIPHENHYDRAMINE HCL 50 MG/ML IJ SOLN
INTRAMUSCULAR | Status: AC
  Filled 2021-06-12: qty 1

## 2021-06-12 MED ORDER — SODIUM CHLORIDE 0.9 % IV SOLN
Freq: Once | INTRAVENOUS | Status: AC
Start: 2021-06-12 — End: 2021-06-12
  Filled 2021-06-12: qty 250

## 2021-06-12 MED ORDER — SODIUM CHLORIDE 0.9 % IV SOLN
48.0000 mg/m2 | Freq: Once | INTRAVENOUS | Status: AC
  Administered 2021-06-12: 100 mg via INTRAVENOUS
  Filled 2021-06-12: qty 10

## 2021-06-12 MED ORDER — SODIUM CHLORIDE 0.9 % IV SOLN
360.0000 mg | Freq: Once | INTRAVENOUS | Status: AC
  Administered 2021-06-12: 360 mg via INTRAVENOUS
  Filled 2021-06-12: qty 36

## 2021-06-12 MED ORDER — DEXAMETHASONE SODIUM PHOSPHATE 10 MG/ML IJ SOLN
INTRAMUSCULAR | Status: AC
  Filled 2021-06-12: qty 1

## 2021-06-12 MED ORDER — DIPHENHYDRAMINE HCL 25 MG PO CAPS
25.0000 mg | ORAL_CAPSULE | Freq: Once | ORAL | Status: AC
  Administered 2021-06-12: 25 mg via ORAL

## 2021-06-12 MED ORDER — DIPHENHYDRAMINE HCL 25 MG PO CAPS
ORAL_CAPSULE | ORAL | Status: AC
  Filled 2021-06-12: qty 1

## 2021-06-12 MED ORDER — HEPARIN SOD (PORK) LOCK FLUSH 100 UNIT/ML IV SOLN
500.0000 [IU] | Freq: Once | INTRAVENOUS | Status: AC | PRN
  Administered 2021-06-12: 500 [IU]
  Filled 2021-06-12: qty 5

## 2021-06-12 MED ORDER — ACETAMINOPHEN 325 MG PO TABS
650.0000 mg | ORAL_TABLET | Freq: Once | ORAL | Status: AC
Start: 2021-06-12 — End: 2021-06-12
  Administered 2021-06-12: 650 mg via ORAL

## 2021-06-12 MED ORDER — PREDNISONE 2.5 MG PO TABS: 2.5000 mg | ORAL_TABLET | Freq: Every day | ORAL | 3 refills | Status: DC

## 2021-06-12 MED ORDER — FAMOTIDINE 20 MG IN NS 100 ML IVPB
20.0000 mg | Freq: Once | INTRAVENOUS | Status: AC
Start: 2021-06-12 — End: 2021-06-12
  Administered 2021-06-12: 20 mg via INTRAVENOUS

## 2021-06-12 MED ORDER — FOSAPREPITANT DIMEGLUMINE INJECTION 150 MG
150.0000 mg | Freq: Once | INTRAVENOUS | Status: AC
  Administered 2021-06-12: 150 mg via INTRAVENOUS
  Filled 2021-06-12: qty 150

## 2021-06-12 MED ORDER — SODIUM CHLORIDE 0.9% FLUSH
10.0000 mL | INTRAVENOUS | Status: DC | PRN
  Administered 2021-06-12: 10 mL
  Filled 2021-06-12: qty 10

## 2021-06-12 MED ORDER — SODIUM CHLORIDE 0.9% FLUSH
10.0000 mL | Freq: Once | INTRAVENOUS | Status: AC
  Administered 2021-06-12: 10 mL
  Filled 2021-06-12: qty 10

## 2021-06-12 MED ORDER — DEXAMETHASONE SODIUM PHOSPHATE 10 MG/ML IJ SOLN
5.0000 mg | Freq: Once | INTRAMUSCULAR | Status: AC
  Administered 2021-06-12: 5 mg via INTRAVENOUS

## 2021-06-12 MED ORDER — DIPHENHYDRAMINE HCL 50 MG/ML IJ SOLN
25.0000 mg | Freq: Once | INTRAMUSCULAR | Status: AC
  Administered 2021-06-12: 25 mg via INTRAVENOUS

## 2021-06-12 MED ORDER — ACETAMINOPHEN 325 MG PO TABS
ORAL_TABLET | ORAL | Status: AC
  Filled 2021-06-12: qty 2

## 2021-06-12 MED ORDER — PREDNISONE 10 MG PO TABS
5.0000 mg | ORAL_TABLET | Freq: Every day | ORAL | 0 refills | Status: DC
Start: 2021-06-12 — End: 2021-06-12

## 2021-06-12 MED ORDER — PALONOSETRON HCL INJECTION 0.25 MG/5ML
INTRAVENOUS | Status: AC
  Filled 2021-06-12: qty 5

## 2021-06-12 MED ORDER — SODIUM CHLORIDE 0.9 % IV SOLN: 598.7690 mg | Freq: Once | INTRAVENOUS | Status: DC

## 2021-06-12 MED ORDER — PALONOSETRON HCL INJECTION 0.25 MG/5ML
0.2500 mg | Freq: Once | INTRAVENOUS | Status: AC
Start: 2021-06-12 — End: 2021-06-12
  Administered 2021-06-12: 0.25 mg via INTRAVENOUS

## 2021-06-12 MED ORDER — FAMOTIDINE 20 MG IN NS 100 ML IVPB
INTRAVENOUS | Status: AC
  Filled 2021-06-12: qty 100

## 2021-06-12 MED ORDER — SODIUM CHLORIDE 0.9% IV SOLUTION
250.0000 mL | Freq: Once | INTRAVENOUS | Status: AC
Start: 2021-06-12 — End: 2021-06-12
  Administered 2021-06-12: 250 mL via INTRAVENOUS
  Filled 2021-06-12: qty 250

## 2021-06-12 NOTE — Progress Notes (Signed)
Decrease Carboplatin to 360mg  (AUC 3) per MD.  Acquanetta Belling, RPH, BCPS, BCOP 06/12/2021 12:59 PM

## 2021-06-12 NOTE — Assessment & Plan Note (Signed)
She complained of severe peripheral neuropathy I plan to reduce the dose of Taxotere little bit especially with recent weight gain and high calculated doses of chemo

## 2021-06-12 NOTE — Assessment & Plan Note (Signed)
This is due to side effects of treatment We will proceed with chemotherapy without delay with 1 unit of blood transfusion support As above, plan to reduce the carboplatin a little bit further  We discussed some of the risks, benefits, and alternatives of blood transfusions. The patient is symptomatic from anemia and the hemoglobin level is critically low.  Some of the side-effects to be expected including risks of transfusion reactions, chills, infection, syndrome of volume overload and risk of hospitalization from various reasons and the patient is willing to proceed and went ahead to sign consent today.

## 2021-06-12 NOTE — Patient Instructions (Addendum)
[facility] ONCOLOGY  Discharge Instructions: Thank you for choosing [facility] to provide your oncology and hematology care.   If you have a lab appointment with the [facility], please go directly to the [facility] and check in at the registration area.   Wear comfortable clothing and clothing appropriate for easy access to any Portacath or PICC line.   We strive to give you quality time with your provider. You may need to reschedule your appointment if you arrive late (15 or more minutes).  Arriving late affects you and other patients whose appointments are after yours.  Also, if you miss three or more appointments without notifying the office, you may be dismissed from the clinic at the provider's discretion.      For prescription refill requests, have your pharmacy contact our office and allow 72 hours for refills to be completed.    Today you received the following chemotherapy and/or immunotherapy agents: Taxotere/Carboplatin      To help prevent nausea and vomiting after your treatment, we encourage you to take your nausea medication as directed.  BELOW ARE SYMPTOMS THAT SHOULD BE REPORTED IMMEDIATELY: *FEVER GREATER THAN 100.4 F (38 C) OR HIGHER *CHILLS OR SWEATING *NAUSEA AND VOMITING THAT IS NOT CONTROLLED WITH YOUR NAUSEA MEDICATION *UNUSUAL SHORTNESS OF BREATH *UNUSUAL BRUISING OR BLEEDING *URINARY PROBLEMS (pain or burning when urinating, or frequent urination) *BOWEL PROBLEMS (unusual diarrhea, constipation, pain near the anus) TENDERNESS IN MOUTH AND THROAT WITH OR WITHOUT PRESENCE OF ULCERS (sore throat, sores in mouth, or a toothache) UNUSUAL RASH, SWELLING OR PAIN  UNUSUAL VAGINAL DISCHARGE OR ITCHING   Items with * indicate a potential emergency and should be followed up as soon as possible or go to the Emergency Department if any problems should occur.  Please show the CHEMOTHERAPY ALERT CARD or IMMUNOTHERAPY ALERT CARD at  check-in to the Emergency Department and triage nurse.  Should you have questions after your visit or need to cancel or reschedule your appointment, please contact [facility]  Dept: [phone]  and follow the prompts.  Office hours are 8:00 a.m. to 4:30 p.m. Monday - Friday. Please note that voicemails left after 4:00 p.m. may not be returned until the following business day.  We are closed weekends and major holidays. You have access to a nurse at all times for urgent questions. Please call the main number to the clinic Dept: [phone] and follow the prompts.   For any non-urgent questions, you may also contact your provider using MyChart. We now offer e-Visits for anyone 38 and older to request care online for non-urgent symptoms. For details visit [email or web address].   Also download the MyChart app! Go to the app store, search "MyChart", open the app, select [facility], and log in with your MyChart username and password.  Due to Covid, a mask is required upon entering the hospital/clinic. If you do not have a mask, one will be given to you upon arrival. For doctor visits, patients may have 1 support person aged 93 or older with them. For treatment visits, patients cannot have anyone with them due to current Covid guidelines and our immunocompromised population.   Blood Transfusion, Adult, Care After This sheet gives you information about how to care for yourself after your procedure. Your doctor may also give you more specific instructions. If youhave problems or questions, contact your doctor. What can I expect after the procedure? After the procedure, it is common  to have: Bruising and soreness at the IV site. A fever or chills on the day of the procedure. This may be your body's response to the new blood cells received. A headache. Follow these instructions at home: Insertion site care     Follow instructions from your doctor about how to take  care of your insertion site. This is where an IV tube was put into your vein. Make sure you: Wash your hands with soap and water before and after you change your bandage (dressing). If you cannot use soap and water, use hand sanitizer. Change your bandage as told by your doctor. Check your insertion site every day for signs of infection. Check for: Redness, swelling, or pain. Bleeding from the site. Warmth. Pus or a bad smell. General instructions Take over-the-counter and prescription medicines only as told by your doctor. Rest as told by your doctor. Go back to your normal activities as told by your doctor. Keep all follow-up visits as told by your doctor. This is important. Contact a doctor if: You have itching or red, swollen areas of skin (hives). You feel worried or nervous (anxious). You feel weak after doing your normal activities. You have redness, swelling, warmth, or pain around the insertion site. You have blood coming from the insertion site, and the blood does not stop with pressure. You have pus or a bad smell coming from the insertion site. Get help right away if: You have signs of a serious reaction. This may be coming from an allergy or the body's defense system (immune system). Signs include: Trouble breathing or shortness of breath. Swelling of the face or feeling warm (flushed). Fever or chills. Head, chest, or back pain. Dark pee (urine) or blood in the pee. Widespread rash. Fast heartbeat. Feeling dizzy or light-headed. You may receive your blood transfusion in an outpatient setting. If so, youwill be told whom to contact to report any reactions. These symptoms may be an emergency. Do not wait to see if the symptoms will go away. Get medical help right away. Call your local emergency services (911 in the [place] Do not drive yourself to the hospital. Summary Bruising and soreness at the IV site are common. Check your insertion site every day for signs of  infection. Rest as told by your doctor. Go back to your normal activities as told by your doctor. Get help right away if you have signs of a serious reaction. This information is not intended to replace advice given to you by your health care provider. Make sure you discuss any questions you have with your healthcare provider. Document Revised: 05/27/2019 Document Reviewed: 05/27/2019 Elsevier Patient Education  [place].

## 2021-06-12 NOTE — Assessment & Plan Note (Signed)
She has severe pancytopenia and neuropathy from treatment Plan to reduce the dose of carboplatin to AUC of 3 I plan to repeat imaging study in August We will provide transfusion support today

## 2021-06-12 NOTE — Progress Notes (Signed)
Per Dr. Gorsuch, okay to treat with elevated BP. 

## 2021-06-12 NOTE — Progress Notes (Signed)
[place] OFFICE PROGRESS NOTE  Patient Care Team: [name], [name], MD as PCP - General (Family Medicine)  ASSESSMENT & PLAN:  Endometrial cancer [facility]) She has severe pancytopenia and neuropathy from treatment Plan to reduce the dose of carboplatin to AUC of 3 I plan to repeat imaging study in August We will provide transfusion support today  Anemia due to antineoplastic chemotherapy This is due to side effects of treatment We will proceed with chemotherapy without delay with 1 unit of blood transfusion support As above, plan to reduce the carboplatin a little bit further  We discussed some of the risks, benefits, and alternatives of blood transfusions. The patient is symptomatic from anemia and the hemoglobin level is critically low.  Some of the side-effects to be expected including risks of transfusion reactions, chills, infection, syndrome of volume overload and risk of hospitalization from various reasons and the patient is willing to proceed and went ahead to sign consent today.   Peripheral neuropathy due to chemotherapy [facility]) She complained of severe peripheral neuropathy I plan to reduce the dose of Taxotere little bit especially with recent weight gain and high calculated doses of chemo  Sarcoidosis ([place]) She has profound weight gain due to side effects of treatment and prednisone After a lot of discussion, she is in agreement to reduce prednisone to 2.5 mg  Orders Placed This Encounter  Procedures   Care order/instruction    Transfuse Parameters    Standing Status:   Future    Standing Expiration Date:   06/12/2022   Informed Consent Details: Physician/Practitioner Attestation; Transcribe to consent form and obtain patient signature    Standing Status:   Future    Standing Expiration Date:   06/12/2022    Order Specific Question:   Physician/Practitioner attestation of informed consent for blood and or blood product transfusion    Answer:   I, the  physician/practitioner, attest that I have discussed with the patient the benefits, risks, side effects, alternatives, likelihood of achieving goals and potential problems during recovery for the procedure that I have provided informed consent.    Order Specific Question:   Product(s)    Answer:   All Product(s)   Type and screen         Standing Status:   Future    Number of Occurrences:   1    Standing Expiration Date:   06/12/2022   Prepare RBC (crossmatch)    Standing Status:   Standing    Number of Occurrences:   1    Order Specific Question:   # of Units    Answer:   1 unit    Order Specific Question:   Transfusion Indications    Answer:   Symptomatic Anemia    Order Specific Question:   Special Requirements    Answer:   Irradiated    Order Specific Question:   Number of Units to Keep Ahead    Answer:   NO units ahead    Order Specific Question:   Instructions:    Answer:   Transfuse    Order Specific Question:   If emergent release call blood bank    Answer:   Not emergent release    All questions were answered. The patient knows to call the clinic with any problems, questions or concerns. The total time spent in the appointment was 40 minutes encounter with patients including review of chart and various tests results, discussions about plan of care and coordination of  care plan   [name], MD 06/12/2021 1:05 PM  INTERVAL HISTORY: Please see below for problem oriented charting. She returns for treatment and follow-up She has gained some weight She complain of peripheral neuropathy especially at nighttime Complains of fatigue No recent nausea Denies recent constipation No recent infection, fever or chills The patient denies any recent signs or symptoms of bleeding such as spontaneous epistaxis, hematuria or hematochezia.   SUMMARY OF ONCOLOGIC HISTORY: Oncology History Overview Note  ER/PR neg, MSI stable   Endometrial cancer ([place])  06/15/2018 Surgery   Surgeon:  Donaciano Eva     Operation: Robotic-assisted laparoscopic total hysterectomy with bilateral salpingoophorectomy, SLN biopsy     Operative Findings:  : 6cm uterus, normal tubes and ovaries, omentum adherent to the anterior abdominal wall and uterine fundus. No suspicious nodes.   06/15/2018 Pathology Results   Procedure: Total hysterectomy with bilateral salpingo-oophorectomy. Bilateral obturator sentinel lymph node biopsies. Histologic type: Endometrioid adenocarcinoma. Histologic Grade: FIGO grade 1. Myometrial invasion: Depth of invasion: 15 mm Myometrial thickness: 20 mm Uterine Serosa Involvement: Not identified. Cervical stromal involvement: Not identified. Extent of involvement of other organs: Uninvolved. Lymphovascular invasion: Present. Regional Lymph Nodes: Examined: 2 Sentinel 0 Non-sentinel 2 Total Lymph nodes with metastasis: 0 Isolated tumor cells (< 0.2 mm): 2 Micrometastasis: (> 0.2 mm and < 2.0 mm): 0 Macrometastasis: (> 2.0 mm): 0 Extracapsular extension: N/A. Tumor block for ancillary studies: 4Q-S. MMR / MSI testing: Will be ordered. Pathologic Stage Classification (pTNM, AJCC 8th edition): pT1b, pN0(i+)   07/20/2018 Imaging   1. Surgical changes from recent hysterectomy. No findings suspicious for residual tumor or adenopathy in the pelvis. 2. No CT findings to suggest omental, peritoneal surface or solid organ metastatic disease in the abdomen. 3. Changes consistent with known sarcoidosis in the chest with bulky calcified mediastinal and hilar lymph nodes and interstitial lung disease. I do not see any definite CT findings to suggest metastatic disease involving the chest.   08/19/2018 - 09/17/2018 Radiation Therapy   08/19/18 - 09/17/18: Vaginal Cuff, 3 cm cylinder with treatment length of 3 cm / 30 Gy delivered in 5 fractions of 6 Gy, Brachytherapy / HDR, Iridium-192   05/2005: Left Chest Wall keloid   04/2004 - 05/2004: Anus, pelvis /inguinal ( Dr.  Tammi Klippel)   01/19/2020 Imaging   1. No definite CT findings of the abdomen or pelvis to explain pain. 2. The sigmoid colon and rectum are decompressed although slightly thickened appearing, suggestive of nonspecific infectious, inflammatory, or ischemic colitis. Correlate for referable clinical symptoms. 3. There is trace, nonspecific free fluid in the low pelvis, which may be reactive, although would be difficult to distinguish from postoperative and post treatment soft tissue change in the pelvis given history of hysterectomy and rectal cancer. 4. Unchanged prominent retroperitoneal and iliac lymph nodes. No new findings suspicious for metastatic disease in the abdomen or pelvis. 5. Bibasilar pulmonary fibrosis and mediastinal and hilar lymphadenopathy, better evaluated by prior CT chest and consistent with patient diagnosis of sarcoidosis.       04/18/2020 Imaging   Venous Doppler Left:  - No evidence of deep vein thrombosis seen in the left lower extremity, from the common femoral through the popliteal veins.  - No evidence of superficial venous thrombosis in the left lower extremity.  - There is no evidence of venous reflux seen in the left lower extremity.  - Rouleax flow noted in the superficial system.    05/18/2020 PET scan   1.  Complex scan with a combination of hypermetabolic granulomatous disease (sarcoidosis) as well as hypermetabolic metastatic endometrial carcinoma. 2. Hypermetabolic mediastinal hilar lymph nodes and pulmonary nodularity with bronchovascular thickening is consistent with sarcoidosis. 3. Solitary hypermetabolic nodule in the medial LEFT lower lobe, new from prior with intense metabolic activity, is concerning for a metastatic deposit to the LEFT lower lobe. 4. Ill-defined hypermetabolic soft tissue along the LEFT operator space is most consistent with local recurrence of endometrial carcinoma. There is entrapment of the LEFT ureter by this hypermetabolic soft tissue  with mild hydroureter and hydronephrosis on the LEFT. 5. Additionally there appears to be direct extension from the hypermetabolic left operator space soft tissue to the sigmoid colon with there is a well-defined hypermetabolic lesion in the colon. 6. More distant hypermetabolic peritoneal metastasis along the ventral peritoneal surface of the RIGHT abdomen beneath RIGHT rectus abdominus muscle. 7. Hypermetabolic lymph nodes in the porta hepatis are favored related to sarcoidosis.   05/19/2020 Cancer Staging   Staging form: Corpus Uteri - Carcinoma and Carcinosarcoma, AJCC 8th Edition - Clinical stage from 05/19/2020: Stage IVB (rcT1, cN2a, cM1) - Signed by [name], MD on 05/19/2020    05/26/2020 Procedure   Successful placement of a right internal jugular approach power injectable Port-A-Cath. The catheter is ready for immediate use   05/29/2020 - 09/27/2020 Chemotherapy   The patient had carboplatin and taxol for chemotherapy treatment.     07/31/2020 Imaging   1. Asymmetric soft tissue mass extending from the left vaginal cuff has decreased in size in the interval. No residual peritoneal nodularity. Pulmonary metastases are stable with exception of a minimally improved left lower lobe nodule. 2. Stable soft tissue nodule associated with the right rectus abdominus muscle. 3. Interval resolution of left hydronephrosis. 4. Calcified mediastinal and hilar lymph nodes, together with pulmonary parenchymal fibrosis, findings which are in keeping with patient's known diagnosis of sarcoid. 5. Aortic atherosclerosis (ICD10-I70.0). Coronary artery calcification.   10/27/2020 Imaging   1. Essentially stable appearance of the soft tissue density nodule posteriorly in the left rectus abdominus muscle, currently 1.8 by 1.2 cm, previously 11 mm. 2. Stable calcified adenopathy in the mediastinum and hila, compatible with prior granulomatous disease. 3. Stable small right lower lobe pulmonary nodules. 4.  Stable soft tissue density prominence along the left vaginal cuff extending up towards the adnexa, probably therapy related. 5. Other imaging findings of potential clinical significance: Coronary atherosclerosis. Small type 1 hiatal hernia. Stable subpleural reticulation favoring the lung bases, possibly from mild fibrosis. 6. Aortic atherosclerosis.   01/19/2021 Imaging   1. Status post hysterectomy. Interval enlargement of soft tissue adjacent to the left aspect of the vaginal cuff and bladder, measuring approximately 4.3 x 2.9 cm in axial section, previously previously previously 3.7 x 1.8 cm when measured similarly. Extensive soft tissue thickening and stranding throughout the low pelvis. 2. Interval enlargement of a metastatic nodule within the right rectus abdominus. New small peritoneal nodule anterior to the sigmoid colon. 3. Interval enlargement of retroperitoneal retroperitoneal lymph nodes. 4. Multiple small bilateral pulmonary nodules, several of which are new and enlarged. 5. Constellation of findings above is consistent with worsened metastatic disease. 6. New left hydronephrosis and hydroureter, the distal left ureter obstructed by soft tissue near the left aspect of the urinary bladder and vaginal cuff. 7. Unchanged enlarged, partially calcified mediastinal and hilar lymph nodes,, in keeping with prior granulomatous infection or sarcoidosis. 8. Mild underlying pulmonary fibrosis, in a UIP pattern. 9. Coronary artery disease.  02/02/2021 -  Chemotherapy    Patient is on Treatment Plan: OVARIAN CARBOPLATIN / DOCETAXEL Q21D       04/17/2021 Imaging   1. Interval decrease in size of multiple pulmonary nodules, with resolution of some previously seen nodules. 2. Interval decrease in size of enlarged retroperitoneal lymph nodes. 3. Interval decrease in size of soft tissue at the left aspect of the vaginal cuff status post hysterectomy. 4. Interval decrease in size of a soft tissue  nodule within the posterior aspect of the right rectus abdominus muscle body. 5. Constellation of findings is consistent with treatment response of locally recurrent, nodal, and distant metastatic disease. 6. There has been interval resolution of previously seen left hydronephrosis and hydroureter. 7. 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)    05/29/2020 - 09/27/2020 Chemotherapy   The patient had carboplatin and taxol for chemotherapy treatment.     02/02/2021 -  Chemotherapy    Patient is on Treatment Plan: OVARIAN CARBOPLATIN / DOCETAXEL Q21D         REVIEW OF SYSTEMS:   Constitutional: Denies fevers, chills or abnormal weight loss Eyes: Denies blurriness of vision Ears, nose, mouth, throat, and face: Denies mucositis or sore throat Respiratory: Denies cough, dyspnea or wheezes Cardiovascular: Denies palpitation, chest discomfort  Gastrointestinal:  Denies nausea, heartburn or change in bowel habits Skin: Denies abnormal skin rashes Lymphatics: Denies new lymphadenopathy or easy bruising Behavioral/Psych: Mood is stable, no new changes  All other systems were reviewed with the patient and are negative.  I have reviewed the past medical history, past surgical history, social history and family history with the patient and they are unchanged from previous note.  ALLERGIES:  is allergic to shellfish allergy and penicillins.  MEDICATIONS:  Current Outpatient  Medications  Medication Sig Dispense Refill   albuterol (VENTOLIN HFA) 108 (90 Base) MCG/ACT inhaler INHALE 2 PUFFS INTO THE LUNGS EVERY 6 HOURS AS NEEDED FOR WHEEZING OR SHORTNESS OF BREATH (NEED MD APPOINTMENT) 3 each 0   bisoprolol (ZEBETA) 10 MG tablet Take 1 tablet (10 mg total) by mouth in the morning and at bedtime. 180 tablet 0   budesonide-formoterol (SYMBICORT) 80-4.5 MCG/ACT inhaler Inhale 2 puffs into the lungs 2 (two) times daily. 3 Inhaler 3   desonide (DESOWEN) 0.05 % lotion Apply topically 2 (two) times daily. Continue following with dermatologist 59 mL 0   dexamethasone (DECADRON) 4 MG tablet TAKE 2 TABLETS BY MOUTH 2 TIMES DAILY THE DAY BEFORE TAXOTERE THEN ONCE DAILY FOR 3 DAYS AFTER CARBOPLATIN CHEMO 30 tablet 1   dextromethorphan (DELSYM) 30 MG/5ML liquid Take by mouth as needed for cough.      hydrochlorothiazide (HYDRODIURIL) 25 MG tablet Take 25 mg by mouth daily.     hydrocortisone 2.5 % cream Apply 1 application topically 2 (two) times daily.     loratadine (CLARITIN) 10 MG tablet Take 10 mg by mouth daily as needed for allergies.     morphine (MSIR) 15 MG  tablet TAKE 1 TABLET BY MOUTH EVERY 4 HOURS AS NEEDED FOR SEVERE PAIN 90 tablet 0   ondansetron (ZOFRAN) 8 MG tablet Take 1 tablet (8 mg total) by mouth every 8 (eight) hours as needed for nausea. 30 tablet 3   pantoprazole (PROTONIX) 40 MG tablet Take 1 tablet (40 mg total) by mouth daily. Patient needs appt for further refills. 90 tablet 0   polyethylene glycol (MIRALAX / GLYCOLAX) 17 g packet Take 17 g by mouth daily.     predniSONE (DELTASONE) 2.5 MG tablet Take 1 tablet (2.5 mg total) by mouth daily with breakfast. 90 tablet 3   prochlorperazine (COMPAZINE) 10 MG tablet TAKE 1 TABLET BY MOUTH EVERY 6 HOURS AS NEEDED FOR NAUSEA OR VOMITING 60 tablet 3   RABEprazole (ACIPHEX) 20 MG tablet Take 1 tablet (20 mg total) by mouth daily before breakfast. 90 tablet 3   spironolactone (ALDACTONE) 25 MG tablet TAKE 1 TABLET BY  MOUTH DAILY. 30 tablet 1   vitamin C (ASCORBIC ACID) 500 MG tablet Take 500 mg by mouth daily.     No current facility-administered medications for this visit.   Facility-Administered Medications Ordered in Other Visits  Medication Dose Route Frequency Provider Last Rate Last Admin   CARBOplatin (PARAPLATIN) 360 mg in sodium chloride 0.9 % 250 mL chemo infusion  360 mg Intravenous Once Alvy Bimler, Hokulani Rogel, MD       DOCEtaxel (TAXOTERE) 100 mg in sodium chloride 0.9 % 250 mL chemo infusion  48 mg/m2 (Treatment Plan Recorded) Intravenous Once Alvy Bimler, Ettore Trebilcock, MD       fosaprepitant (EMEND) 150 mg in sodium chloride 0.9 % 145 mL IVPB  150 mg Intravenous Once Alvy Bimler, Malin Sambrano, MD       heparin lock flush 100 unit/mL  500 Units Intracatheter Once PRN Alvy Bimler, Lyndsee Casa, MD       sodium chloride flush (NS) 0.9 % injection 10 mL  10 mL Intracatheter PRN Alvy Bimler, Yudit Modesitt, MD        PHYSICAL EXAMINATION: ECOG PERFORMANCE STATUS: 2 - Symptomatic, <50% confined to bed  Vitals:   06/12/21 1144  BP: (!) 188/89  Pulse: 86  Resp: 18  Temp: 99.1 F (37.3 C)  SpO2: 98%   Filed Weights   06/12/21 1144  Weight: 226 lb 12.8 oz (102.9 kg)    GENERAL:alert, no distress and comfortable SKIN: skin color, texture, turgor are normal, no rashes or significant lesions EYES: normal, Conjunctiva are pink and non-injected, sclera clear OROPHARYNX:no exudate, no erythema and lips, buccal mucosa, and tongue normal  NECK: supple, thyroid normal size, non-tender, without nodularity LYMPH:  no palpable lymphadenopathy in the cervical, axillary or inguinal LUNGS: clear to auscultation and percussion with normal breathing effort HEART: regular rate & rhythm and no murmurs with moderate bilateral lower extremity edema ABDOMEN:abdomen soft, non-tender and normal bowel sounds Musculoskeletal:no cyanosis of digits and no clubbing  NEURO: alert & oriented x 3 with fluent speech, no focal motor/sensory deficits  LABORATORY DATA:  I have  reviewed the data as listed    Component Value Date/Time   NA 142 06/12/2021 1121   K 4.7 06/12/2021 1121   CL 107 06/12/2021 1121   CO2 23 06/12/2021 1121   GLUCOSE 165 (H) 06/12/2021 1121   BUN 19 06/12/2021 1121   CREATININE 1.00 06/12/2021 1121   CALCIUM 9.4 06/12/2021 1121   PROT 7.6 06/12/2021 1121   ALBUMIN 3.5 06/12/2021 1121   AST 18 06/12/2021 1121   ALT 19 06/12/2021 1121  ALKPHOS 95 06/12/2021 1121   BILITOT 0.6 06/12/2021 1121   GFRNONAA >60 06/12/2021 1121   GFRAA >60 09/08/2020 0819    No results found for: SPEP, UPEP  Lab Results  Component Value Date   WBC 5.0 06/12/2021   NEUTROABS 4.3 06/12/2021   HGB 7.6 (L) 06/12/2021   HCT 22.8 (L) 06/12/2021   MCV 106.0 (H) 06/12/2021   PLT 112 (L) 06/12/2021      Chemistry      Component Value Date/Time   NA 142 06/12/2021 1121   K 4.7 06/12/2021 1121   CL 107 06/12/2021 1121   CO2 23 06/12/2021 1121   BUN 19 06/12/2021 1121   CREATININE 1.00 06/12/2021 1121      Component Value Date/Time   CALCIUM 9.4 06/12/2021 1121   ALKPHOS 95 06/12/2021 1121   AST 18 06/12/2021 1121   ALT 19 06/12/2021 1121   BILITOT 0.6 06/12/2021 1121

## 2021-06-12 NOTE — Assessment & Plan Note (Signed)
She has profound weight gain due to side effects of treatment and prednisone After a lot of discussion, she is in agreement to reduce prednisone to 2.5 mg

## 2021-06-12 NOTE — Progress Notes (Signed)
Per Dr. Alvy Bimler, okay to treat with low hgb.  Patient will receive 1 unit of PRBCs after chemotherapy treatment.

## 2021-06-12 NOTE — Telephone Encounter (Signed)
Scheduled patient per 06/28 sch msg. Patient is aware.

## 2021-06-13 LAB — BPAM RBC
Blood Product Expiration Date: 202207212359
ISSUE DATE / TIME: 202206281617
Unit Type and Rh: 6200

## 2021-06-13 LAB — TYPE AND SCREEN
ABO/RH(D): A POS
Antibody Screen: NEGATIVE
Unit division: 0

## 2021-06-15 ENCOUNTER — Encounter: Payer: Self-pay | Admitting: Hematology and Oncology

## 2021-06-15 ENCOUNTER — Other Ambulatory Visit (HOSPITAL_COMMUNITY): Payer: Self-pay

## 2021-06-26 ENCOUNTER — Other Ambulatory Visit: Payer: Self-pay | Admitting: Hematology and Oncology

## 2021-06-26 ENCOUNTER — Other Ambulatory Visit (HOSPITAL_COMMUNITY): Payer: Self-pay

## 2021-06-26 MED ORDER — SPIRONOLACTONE 25 MG PO TABS
ORAL_TABLET | Freq: Every day | ORAL | 1 refills | Status: DC
  Filled 2021-06-26: qty 30, 30d supply, fill #0
  Filled 2021-08-22: qty 30, 30d supply, fill #1

## 2021-06-28 ENCOUNTER — Other Ambulatory Visit (HOSPITAL_COMMUNITY): Payer: Self-pay

## 2021-07-13 ENCOUNTER — Inpatient Hospital Stay: Payer: Medicare HMO

## 2021-07-13 ENCOUNTER — Inpatient Hospital Stay: Payer: Medicare HMO | Attending: Hematology and Oncology

## 2021-07-13 ENCOUNTER — Other Ambulatory Visit: Payer: Self-pay

## 2021-07-13 ENCOUNTER — Inpatient Hospital Stay: Payer: Medicare HMO | Admitting: Hematology and Oncology

## 2021-07-13 ENCOUNTER — Other Ambulatory Visit: Payer: Self-pay | Admitting: Hematology and Oncology

## 2021-07-13 ENCOUNTER — Encounter: Payer: Self-pay | Admitting: Hematology and Oncology

## 2021-07-13 VITALS — BP 184/82 | HR 76 | Temp 98.6°F | Resp 18 | Ht 65.5 in | Wt 227.2 lb

## 2021-07-13 DIAGNOSIS — C7802 Secondary malignant neoplasm of left lung: Secondary | ICD-10-CM | POA: Insufficient documentation

## 2021-07-13 DIAGNOSIS — D6481 Anemia due to antineoplastic chemotherapy: Secondary | ICD-10-CM

## 2021-07-13 DIAGNOSIS — C775 Secondary and unspecified malignant neoplasm of intrapelvic lymph nodes: Secondary | ICD-10-CM | POA: Diagnosis not present

## 2021-07-13 DIAGNOSIS — T451X5A Adverse effect of antineoplastic and immunosuppressive drugs, initial encounter: Secondary | ICD-10-CM | POA: Diagnosis not present

## 2021-07-13 DIAGNOSIS — I10 Essential (primary) hypertension: Secondary | ICD-10-CM

## 2021-07-13 DIAGNOSIS — Z5111 Encounter for antineoplastic chemotherapy: Secondary | ICD-10-CM | POA: Insufficient documentation

## 2021-07-13 DIAGNOSIS — R739 Hyperglycemia, unspecified: Secondary | ICD-10-CM

## 2021-07-13 DIAGNOSIS — C541 Malignant neoplasm of endometrium: Secondary | ICD-10-CM

## 2021-07-13 DIAGNOSIS — G620 Drug-induced polyneuropathy: Secondary | ICD-10-CM

## 2021-07-13 DIAGNOSIS — R600 Localized edema: Secondary | ICD-10-CM | POA: Diagnosis not present

## 2021-07-13 DIAGNOSIS — T50905A Adverse effect of unspecified drugs, medicaments and biological substances, initial encounter: Secondary | ICD-10-CM | POA: Diagnosis not present

## 2021-07-13 LAB — CBC WITH DIFFERENTIAL (CANCER CENTER ONLY)
Abs Immature Granulocytes: 0.06 10*3/uL (ref 0.00–0.07)
Basophils Absolute: 0 10*3/uL (ref 0.0–0.1)
Basophils Relative: 0 %
Eosinophils Absolute: 0 10*3/uL (ref 0.0–0.5)
Eosinophils Relative: 0 %
HCT: 26.5 % — ABNORMAL LOW (ref 36.0–46.0)
Hemoglobin: 8.8 g/dL — ABNORMAL LOW (ref 12.0–15.0)
Immature Granulocytes: 1 %
Lymphocytes Relative: 9 %
Lymphs Abs: 0.6 10*3/uL — ABNORMAL LOW (ref 0.7–4.0)
MCH: 34.5 pg — ABNORMAL HIGH (ref 26.0–34.0)
MCHC: 33.2 g/dL (ref 30.0–36.0)
MCV: 103.9 fL — ABNORMAL HIGH (ref 80.0–100.0)
Monocytes Absolute: 0.2 10*3/uL (ref 0.1–1.0)
Monocytes Relative: 3 %
Neutro Abs: 5.8 10*3/uL (ref 1.7–7.7)
Neutrophils Relative %: 87 %
Platelet Count: 169 10*3/uL (ref 150–400)
RBC: 2.55 MIL/uL — ABNORMAL LOW (ref 3.87–5.11)
RDW: 19.5 % — ABNORMAL HIGH (ref 11.5–15.5)
WBC Count: 6.6 10*3/uL (ref 4.0–10.5)
nRBC: 0 % (ref 0.0–0.2)

## 2021-07-13 LAB — CMP (CANCER CENTER ONLY)
ALT: 22 U/L (ref 0–44)
AST: 17 U/L (ref 15–41)
Albumin: 3.6 g/dL (ref 3.5–5.0)
Alkaline Phosphatase: 105 U/L (ref 38–126)
Anion gap: 11 (ref 5–15)
BUN: 26 mg/dL — ABNORMAL HIGH (ref 8–23)
CO2: 22 mmol/L (ref 22–32)
Calcium: 10.3 mg/dL (ref 8.9–10.3)
Chloride: 106 mmol/L (ref 98–111)
Creatinine: 0.99 mg/dL (ref 0.44–1.00)
GFR, Estimated: >60 mL/min (ref >60)
Glucose, Bld: 247 mg/dL — ABNORMAL HIGH (ref 70–99)
Potassium: 5.2 mmol/L — ABNORMAL HIGH (ref 3.5–5.1)
Sodium: 139 mmol/L (ref 135–145)
Total Bilirubin: 0.4 mg/dL (ref 0.3–1.2)
Total Protein: 7.8 g/dL (ref 6.5–8.1)

## 2021-07-13 LAB — SAMPLE TO BLOOD BANK

## 2021-07-13 MED ORDER — PALONOSETRON HCL INJECTION 0.25 MG/5ML
0.2500 mg | Freq: Once | INTRAVENOUS | Status: AC
  Administered 2021-07-13: 0.25 mg via INTRAVENOUS

## 2021-07-13 MED ORDER — DEXAMETHASONE SODIUM PHOSPHATE 10 MG/ML IJ SOLN
5.0000 mg | Freq: Once | INTRAMUSCULAR | Status: AC
  Administered 2021-07-13: 5 mg via INTRAVENOUS

## 2021-07-13 MED ORDER — PALONOSETRON HCL INJECTION 0.25 MG/5ML
INTRAVENOUS | Status: AC
  Filled 2021-07-13: qty 5

## 2021-07-13 MED ORDER — DIPHENHYDRAMINE HCL 50 MG/ML IJ SOLN
INTRAMUSCULAR | Status: AC
  Filled 2021-07-13: qty 1

## 2021-07-13 MED ORDER — FAMOTIDINE 20 MG IN NS 100 ML IVPB
20.0000 mg | Freq: Once | INTRAVENOUS | Status: AC
  Administered 2021-07-13: 20 mg via INTRAVENOUS

## 2021-07-13 MED ORDER — HEPARIN SOD (PORK) LOCK FLUSH 100 UNIT/ML IV SOLN
500.0000 [IU] | Freq: Once | INTRAVENOUS | Status: AC | PRN
  Administered 2021-07-13: 500 [IU]
  Filled 2021-07-13: qty 5

## 2021-07-13 MED ORDER — FAMOTIDINE 20 MG IN NS 100 ML IVPB
INTRAVENOUS | Status: AC
  Filled 2021-07-13: qty 100

## 2021-07-13 MED ORDER — DEXAMETHASONE SODIUM PHOSPHATE 10 MG/ML IJ SOLN
INTRAMUSCULAR | Status: AC
  Filled 2021-07-13: qty 1

## 2021-07-13 MED ORDER — SODIUM CHLORIDE 0.9 % IV SOLN
150.0000 mg | Freq: Once | INTRAVENOUS | Status: AC
  Administered 2021-07-13: 150 mg via INTRAVENOUS
  Filled 2021-07-13: qty 150

## 2021-07-13 MED ORDER — SODIUM CHLORIDE 0.9 % IV SOLN
48.0000 mg/m2 | Freq: Once | INTRAVENOUS | Status: AC
  Administered 2021-07-13: 100 mg via INTRAVENOUS
  Filled 2021-07-13: qty 10

## 2021-07-13 MED ORDER — SODIUM CHLORIDE 0.9% FLUSH
10.0000 mL | Freq: Once | INTRAVENOUS | Status: AC
  Administered 2021-07-13: 10 mL
  Filled 2021-07-13: qty 10

## 2021-07-13 MED ORDER — SODIUM CHLORIDE 0.9 % IV SOLN
Freq: Once | INTRAVENOUS | Status: AC
  Filled 2021-07-13: qty 250

## 2021-07-13 MED ORDER — CARBOPLATIN CHEMO INJECTION 450 MG/45ML
362.1000 mg | Freq: Once | INTRAVENOUS | Status: AC
  Administered 2021-07-13: 360 mg via INTRAVENOUS
  Filled 2021-07-13: qty 36

## 2021-07-13 MED ORDER — DIPHENHYDRAMINE HCL 50 MG/ML IJ SOLN
25.0000 mg | Freq: Once | INTRAMUSCULAR | Status: AC
  Administered 2021-07-13: 25 mg via INTRAVENOUS

## 2021-07-13 MED ORDER — SODIUM CHLORIDE 0.9% FLUSH
10.0000 mL | INTRAVENOUS | Status: DC | PRN
  Administered 2021-07-13: 10 mL
  Filled 2021-07-13: qty 10

## 2021-07-13 NOTE — Assessment & Plan Note (Signed)
Her documented blood pressure here is usually elevated but at home were within normal range Observe close

## 2021-07-13 NOTE — Patient Instructions (Signed)
[facility]  Discharge Instructions: Thank you for choosing [facility] to provide your oncology and hematology care.   If you have a lab appointment with the [facility], please go directly to the [facility] and check in at the registration area.   Wear comfortable clothing and clothing appropriate for easy access to any Portacath or PICC line.   We strive to give you quality time with your provider. You may need to reschedule your appointment if you arrive late (15 or more minutes).  Arriving late affects you and other patients whose appointments are after yours.  Also, if you miss three or more appointments without notifying the office, you may be dismissed from the clinic at the provider's discretion.      For prescription refill requests, have your pharmacy contact our office and allow 72 hours for refills to be completed.    Today you received the following chemotherapy and/or immunotherapy agents Taxotere & Carboplatin      To help prevent nausea and vomiting after your treatment, we encourage you to take your nausea medication as directed.  BELOW ARE SYMPTOMS THAT SHOULD BE REPORTED IMMEDIATELY: *FEVER GREATER THAN 100.4 F (38 C) OR HIGHER *CHILLS OR SWEATING *NAUSEA AND VOMITING THAT IS NOT CONTROLLED WITH YOUR NAUSEA MEDICATION *UNUSUAL SHORTNESS OF BREATH *UNUSUAL BRUISING OR BLEEDING *URINARY PROBLEMS (pain or burning when urinating, or frequent urination) *BOWEL PROBLEMS (unusual diarrhea, constipation, pain near the anus) TENDERNESS IN MOUTH AND THROAT WITH OR WITHOUT PRESENCE OF ULCERS (sore throat, sores in mouth, or a toothache) UNUSUAL RASH, SWELLING OR PAIN  UNUSUAL VAGINAL DISCHARGE OR ITCHING   Items with * indicate a potential emergency and should be followed up as soon as possible or go to the Emergency Department if any problems should occur.  Please show the CHEMOTHERAPY ALERT CARD or IMMUNOTHERAPY ALERT CARD at  check-in to the Emergency Department and triage nurse.  Should you have questions after your visit or need to cancel or reschedule your appointment, please contact [facility]  Dept: [phone]  and follow the prompts.  Office hours are 8:00 a.m. to 4:30 p.m. Monday - Friday. Please note that voicemails left after 4:00 p.m. may not be returned until the following business day.  We are closed weekends and major holidays. You have access to a nurse at all times for urgent questions. Please call the main number to the clinic Dept: [phone] and follow the prompts.   For any non-urgent questions, you may also contact your provider using MyChart. We now offer e-Visits for anyone 73 and older to request care online for non-urgent symptoms. For details visit [email or web address].   Also download the MyChart app! Go to the app store, search "MyChart", open the app, select [facility], and log in with your MyChart username and password.  Due to Covid, a mask is required upon entering the hospital/clinic. If you do not have a mask, one will be given to you upon arrival. For doctor visits, patients may have 1 support person aged 73 or older with them. For treatment visits, patients cannot have anyone with them due to current Covid guidelines and our immunocompromised population.   Docetaxel injection What is this medication? DOCETAXEL (doe se TAX el) is a chemotherapy drug. It targets fast dividing cells, like cancer cells, and causes these cells to die. This medicine is used to treat many types of cancers like breast cancer, certain stomach cancers,head and neck  cancer, lung cancer, and prostate cancer. This medicine may be used for other purposes; ask your health care provider orpharmacist if you have questions. COMMON BRAND NAME(S): Docefrez, Taxotere What should I tell my care team before I take this medication? They need to know if you have any of these  conditions: infection (especially a virus infection such as chickenpox, cold sores, or herpes) liver disease low blood counts, like low white cell, platelet, or red cell counts an unusual or allergic reaction to docetaxel, polysorbate 80, other chemotherapy agents, other medicines, foods, dyes, or preservatives pregnant or trying to get pregnant breast-feeding How should I use this medication? This drug is given as an infusion into a vein. It is administered in a hospitalor clinic by a specially trained health care professional. Talk to your pediatrician regarding the use of this medicine in children.Special care may be needed. Overdosage: If you think you have taken too much of this medicine contact apoison control center or emergency room at once. NOTE: This medicine is only for you. Do not share this medicine with others. What if I miss a dose? It is important not to miss your dose. Call your doctor or health careprofessional if you are unable to keep an appointment. What may interact with this medication? Do not take this medicine with any of the following medications: live virus vaccines This medicine may also interact with the following medications: aprepitant certain antibiotics like erythromycin or clarithromycin certain antivirals for HIV or hepatitis certain medicines for fungal infections like fluconazole, itraconazole, ketoconazole, posaconazole, or voriconazole cimetidine ciprofloxacin conivaptan cyclosporine dronedarone fluvoxamine grapefruit juice imatinib verapamil This list may not describe all possible interactions. Give your health care provider a list of all the medicines, herbs, non-prescription drugs, or dietary supplements you use. Also tell them if you smoke, drink alcohol, or use illegaldrugs. Some items may interact with your medicine. What should I watch for while using this medication? Your condition will be monitored carefully while you are receiving this  medicine. You will need important blood work done while you are taking thismedicine. Call your doctor or health care professional for advice if you get a fever, chills or sore throat, or other symptoms of a cold or flu. Do not treat yourself. This drug decreases your body's ability to fight infections. Try toavoid being around people who are sick. Some products may contain alcohol. Ask your health care professional if this medicine contains alcohol. Be sure to tell all health care professionals you are taking this medicine. Certain medicines, like metronidazole and disulfiram, can cause an unpleasant reaction when taken with alcohol. The reaction includes flushing, headache, nausea, vomiting, sweating, and increased thirst. Thereaction can last from 30 minutes to several hours. You may get drowsy or dizzy. Do not drive, use machinery, or do anything that needs mental alertness until you know how this medicine affects you. Do not stand or sit up quickly, especially if you are an older patient. This reduces the risk of dizzy or fainting spells. Alcohol may interfere with the effect ofthis medicine. Talk to your health care professional about your risk of cancer. You may bemore at risk for certain types of cancer if you take this medicine. Do not become pregnant while taking this medicine or for 6 months after stopping it. Women should inform their doctor if they wish to become pregnant or think they might be pregnant. There is a potential for serious side effects to an unborn child. Talk to your health care professional or  pharmacist for more information. Do not breast-feed an infant while taking this medicine orfor 1 week after stopping it. Males who get this medicine must use a condom during sex with females who can get pregnant. If you get a woman pregnant, the baby could have birth defects. The baby could die before they are born. You will need to continue wearing a condom for 3 months after stopping the  medicine. Tell your health care providerright away if your partner becomes pregnant while you are taking this medicine. This may interfere with the ability to father a child. You should talk to yourdoctor or health care professional if you are concerned about your fertility. What side effects may I notice from receiving this medication? Side effects that you should report to your doctor or health care professionalas soon as possible: allergic reactions like skin rash, itching or hives, swelling of the face, lips, or tongue blurred vision breathing problems changes in vision low blood counts - This drug may decrease the number of white blood cells, red blood cells and platelets. You may be at increased risk for infections and bleeding. nausea and vomiting pain, redness or irritation at site where injected pain, tingling, numbness in the hands or feet redness, blistering, peeling, or loosening of the skin, including inside the mouth signs of decreased platelets or bleeding - bruising, pinpoint red spots on the skin, black, tarry stools, nosebleeds signs of decreased red blood cells - unusually weak or tired, fainting spells, lightheadedness signs of infection - fever or chills, cough, sore throat, pain or difficulty passing urine swelling of the ankle, feet, hands Side effects that usually do not require medical attention (report to yourdoctor or health care professional if they continue or are bothersome): constipation diarrhea fingernail or toenail changes hair loss loss of appetite mouth sores muscle pain This list may not describe all possible side effects. Call your doctor for medical advice about side effects. You may report side effects to FDA at1-800-FDA-1088. Where should I keep my medication? This drug is given in a hospital or clinic and will not be stored at home. NOTE: This sheet is a summary. It may not cover all possible information. If you have questions about this medicine,  talk to your doctor, pharmacist, orhealth care provider.  2022 Elsevier/Gold Standard (2019-11-01 19:50:31)  Carboplatin injection What is this medication? CARBOPLATIN (KAR boe pla tin) is a chemotherapy drug. It targets fast dividing cells, like cancer cells, and causes these cells to die. This medicine is usedto treat ovarian cancer and many other cancers. This medicine may be used for other purposes; ask your health care provider orpharmacist if you have questions. COMMON BRAND NAME(S): Paraplatin What should I tell my care team before I take this medication? They need to know if you have any of these conditions: blood disorders hearing problems kidney disease recent or ongoing radiation therapy an unusual or allergic reaction to carboplatin, cisplatin, other chemotherapy, other medicines, foods, dyes, or preservatives pregnant or trying to get pregnant breast-feeding How should I use this medication? This drug is usually given as an infusion into a vein. It is administered in [facility] or clinic by a specially trained health care professional. Talk to your pediatrician regarding the use of this medicine in children.Special care may be needed. Overdosage: If you think you have taken too much of this medicine contact apoison control center or emergency room at once. NOTE: This medicine is only for you. Do not share this medicine with others. What if  I miss a dose? It is important not to miss a dose. Call your doctor or health careprofessional if you are unable to keep an appointment. What may interact with this medication? medicines for seizures medicines to increase blood counts like filgrastim, pegfilgrastim, sargramostim some antibiotics like amikacin, gentamicin, neomycin, streptomycin, tobramycin vaccines Talk to your doctor or health care professional before taking any of thesemedicines: acetaminophen aspirin ibuprofen ketoprofen naproxen This list may not describe all  possible interactions. Give your health care provider a list of all the medicines, herbs, non-prescription drugs, or dietary supplements you use. Also tell them if you smoke, drink alcohol, or use illegaldrugs. Some items may interact with your medicine. What should I watch for while using this medication? Your condition will be monitored carefully while you are receiving this medicine. You will need important blood work done while you are taking thismedicine. This drug may make you feel generally unwell. This is not uncommon, as chemotherapy can affect healthy cells as well as cancer cells. Report any side effects. Continue your course of treatment even though you feel ill unless yourdoctor tells you to stop. In some cases, you may be given additional medicines to help with side effects.Follow all directions for their use. Call your doctor or health care professional for advice if you get a fever, chills or sore throat, or other symptoms of a cold or flu. Do not treat yourself. This drug decreases your body's ability to fight infections. Try toavoid being around people who are sick. This medicine may increase your risk to bruise or bleed. Call your doctor orhealth care professional if you notice any unusual bleeding. Be careful brushing and flossing your teeth or using a toothpick because you may get an infection or bleed more easily. If you have any dental work done,tell your dentist you are receiving this medicine. Avoid taking products that contain aspirin, acetaminophen, ibuprofen, naproxen, or ketoprofen unless instructed by your doctor. These medicines may hide afever. Do not become pregnant while taking this medicine. Women should inform their doctor if they wish to become pregnant or think they might be pregnant. There is a potential for serious side effects to an unborn child. Talk to your health care professional or pharmacist for more information. Do not breast-feed aninfant while taking this  medicine. What side effects may I notice from receiving this medication? Side effects that you should report to your doctor or health care professionalas soon as possible: allergic reactions like skin rash, itching or hives, swelling of the face, lips, or tongue signs of infection - fever or chills, cough, sore throat, pain or difficulty passing urine signs of decreased platelets or bleeding - bruising, pinpoint red spots on the skin, black, tarry stools, nosebleeds signs of decreased red blood cells - unusually weak or tired, fainting spells, lightheadedness breathing problems changes in hearing changes in vision chest pain high blood pressure low blood counts - This drug may decrease the number of white blood cells, red blood cells and platelets. You may be at increased risk for infections and bleeding. nausea and vomiting pain, swelling, redness or irritation at the injection site pain, tingling, numbness in the hands or feet problems with balance, talking, walking trouble passing urine or change in the amount of urine Side effects that usually do not require medical attention (report to yourdoctor or health care professional if they continue or are bothersome): hair loss loss of appetite metallic taste in the mouth or changes in taste This list may not describe  all possible side effects. Call your doctor for medical advice about side effects. You may report side effects to FDA at1-800-FDA-1088. Where should I keep my medication? This drug is given in a hospital or clinic and will not be stored at home. NOTE: This sheet is a summary. It may not cover all possible information. If you have questions about this medicine, talk to your doctor, pharmacist, orhealth care provider.  2022 Elsevier/Gold Standard (2008-03-08 14:38:05)

## 2021-07-13 NOTE — Assessment & Plan Note (Signed)
She has prior evaluation to rule out DVT She has significant fluid retention from steroid treatment

## 2021-07-13 NOTE — Progress Notes (Signed)
[place] OFFICE PROGRESS NOTE  Patient Care Team: [name], [name], MD as PCP - General (Family Medicine)  ASSESSMENT & PLAN:  Endometrial cancer [facility]) She has stable pancytopenia and neuropathy from treatment Plan to continue on reduced dose of carboplatin I plan to repeat imaging study in August  Anemia due to antineoplastic chemotherapy After significant transfusion support, her anemia is stable She does not need blood transfusion today but might needed in her next visit  Drug-induced hyperglycemia She has profound weight gain due to steroid treatment We discussed importance of dietary modification and weight loss  Bilateral lower extremity edema She has prior evaluation to rule out DVT She has significant fluid retention from steroid treatment  Essential hypertension Her documented blood pressure here is usually elevated but at home were within normal range Observe close  Peripheral neuropathy due to chemotherapy ([place]) Neuropathy is stable Observe closely  Orders Placed This Encounter  Procedures   CT CHEST ABDOMEN PELVIS W CONTRAST    Standing Status:   Future    Standing Expiration Date:   07/13/2022    Order Specific Question:   Preferred imaging location?    Answer:   [facility]    Order Specific Question:   Radiology Contrast Protocol - do NOT remove file path    Answer:   [email or web address]    All questions were answered. The patient knows to call the clinic with any problems, questions or concerns. The total time spent in the appointment was 20 minutes encounter with patients including review of chart and various tests results, discussions about plan of care and coordination of care plan   [name], MD 07/13/2021 11:57 AM  INTERVAL HISTORY: Please see below for problem oriented charting. She returns for treatment and follow-up She has persistent high blood pressure with each visit but her  documented blood pressure from home is usually better She is not able to lose any weight She have chronic bilateral lower extremity edema Her chronic neuropathy is stable  SUMMARY OF ONCOLOGIC HISTORY: Oncology History Overview Note  ER/PR neg, MSI stable   Endometrial cancer ([place])  06/15/2018 Surgery   Surgeon: Donaciano Eva     Operation: Robotic-assisted laparoscopic total hysterectomy with bilateral salpingoophorectomy, SLN biopsy     Operative Findings:  : 6cm uterus, normal tubes and ovaries, omentum adherent to the anterior abdominal wall and uterine fundus. No suspicious nodes.   06/15/2018 Pathology Results   Procedure: Total hysterectomy with bilateral salpingo-oophorectomy. Bilateral obturator sentinel lymph node biopsies. Histologic type: Endometrioid adenocarcinoma. Histologic Grade: FIGO grade 1. Myometrial invasion: Depth of invasion: 15 mm Myometrial thickness: 20 mm Uterine Serosa Involvement: Not identified. Cervical stromal involvement: Not identified. Extent of involvement of other organs: Uninvolved. Lymphovascular invasion: Present. Regional Lymph Nodes: Examined: 2 Sentinel 0 Non-sentinel 2 Total Lymph nodes with metastasis: 0 Isolated tumor cells (< 0.2 mm): 2 Micrometastasis: (> 0.2 mm and < 2.0 mm): 0 Macrometastasis: (> 2.0 mm): 0 Extracapsular extension: N/A. Tumor block for ancillary studies: 4Q-S. MMR / MSI testing: Will be ordered. Pathologic Stage Classification (pTNM, AJCC 8th edition): pT1b, pN0(i+)   07/20/2018 Imaging   1. Surgical changes from recent hysterectomy. No findings suspicious for residual tumor or adenopathy in the pelvis. 2. No CT findings to suggest omental, peritoneal surface or solid organ metastatic disease in the abdomen. 3. Changes consistent with known sarcoidosis in the chest with bulky calcified mediastinal and hilar lymph nodes and interstitial lung disease. I do not see  any definite CT findings to suggest  metastatic disease involving the chest.   08/19/2018 - 09/17/2018 Radiation Therapy   08/19/18 - 09/17/18: Vaginal Cuff, 3 cm cylinder with treatment length of 3 cm / 30 Gy delivered in 5 fractions of 6 Gy, Brachytherapy / HDR, Iridium-192   05/2005: Left Chest Wall keloid   04/2004 - 05/2004: Anus, pelvis /inguinal ( Dr. Tammi Klippel)   01/19/2020 Imaging   1. No definite CT findings of the abdomen or pelvis to explain pain. 2. The sigmoid colon and rectum are decompressed although slightly thickened appearing, suggestive of nonspecific infectious, inflammatory, or ischemic colitis. Correlate for referable clinical symptoms. 3. There is trace, nonspecific free fluid in the low pelvis, which may be reactive, although would be difficult to distinguish from postoperative and post treatment soft tissue change in the pelvis given history of hysterectomy and rectal cancer. 4. Unchanged prominent retroperitoneal and iliac lymph nodes. No new findings suspicious for metastatic disease in the abdomen or pelvis. 5. Bibasilar pulmonary fibrosis and mediastinal and hilar lymphadenopathy, better evaluated by prior CT chest and consistent with patient diagnosis of sarcoidosis.       04/18/2020 Imaging   Venous Doppler Left:  - No evidence of deep vein thrombosis seen in the left lower extremity, from the common femoral through the popliteal veins.  - No evidence of superficial venous thrombosis in the left lower extremity.  - There is no evidence of venous reflux seen in the left lower extremity.  - Rouleax flow noted in the superficial system.    05/18/2020 PET scan   1. Complex scan with a combination of hypermetabolic granulomatous disease (sarcoidosis) as well as hypermetabolic metastatic endometrial carcinoma. 2. Hypermetabolic mediastinal hilar lymph nodes and pulmonary nodularity with bronchovascular thickening is consistent with sarcoidosis. 3. Solitary hypermetabolic nodule in the medial LEFT lower lobe, new  from prior with intense metabolic activity, is concerning for a metastatic deposit to the LEFT lower lobe. 4. Ill-defined hypermetabolic soft tissue along the LEFT operator space is most consistent with local recurrence of endometrial carcinoma. There is entrapment of the LEFT ureter by this hypermetabolic soft tissue with mild hydroureter and hydronephrosis on the LEFT. 5. Additionally there appears to be direct extension from the hypermetabolic left operator space soft tissue to the sigmoid colon with there is a well-defined hypermetabolic lesion in the colon. 6. More distant hypermetabolic peritoneal metastasis along the ventral peritoneal surface of the RIGHT abdomen beneath RIGHT rectus abdominus muscle. 7. Hypermetabolic lymph nodes in the porta hepatis are favored related to sarcoidosis.   05/19/2020 Cancer Staging   Staging form: Corpus Uteri - Carcinoma and Carcinosarcoma, AJCC 8th Edition - Clinical stage from 05/19/2020: Stage IVB (rcT1, cN2a, cM1) - Signed by [name], MD on 05/19/2020    05/26/2020 Procedure   Successful placement of a right internal jugular approach power injectable Port-A-Cath. The catheter is ready for immediate use   05/29/2020 - 09/27/2020 Chemotherapy   The patient had carboplatin and taxol for chemotherapy treatment.     07/31/2020 Imaging   1. Asymmetric soft tissue mass extending from the left vaginal cuff has decreased in size in the interval. No residual peritoneal nodularity. Pulmonary metastases are stable with exception of a minimally improved left lower lobe nodule. 2. Stable soft tissue nodule associated with the right rectus abdominus muscle. 3. Interval resolution of left hydronephrosis. 4. Calcified mediastinal and hilar lymph nodes, together with pulmonary parenchymal fibrosis, findings which are in keeping with patient's known diagnosis of sarcoid.  5. Aortic atherosclerosis (ICD10-I70.0). Coronary artery calcification.   10/27/2020 Imaging   1.  Essentially stable appearance of the soft tissue density nodule posteriorly in the left rectus abdominus muscle, currently 1.8 by 1.2 cm, previously 11 mm. 2. Stable calcified adenopathy in the mediastinum and hila, compatible with prior granulomatous disease. 3. Stable small right lower lobe pulmonary nodules. 4. Stable soft tissue density prominence along the left vaginal cuff extending up towards the adnexa, probably therapy related. 5. Other imaging findings of potential clinical significance: Coronary atherosclerosis. Small type 1 hiatal hernia. Stable subpleural reticulation favoring the lung bases, possibly from mild fibrosis. 6. Aortic atherosclerosis.   01/19/2021 Imaging   1. Status post hysterectomy. Interval enlargement of soft tissue adjacent to the left aspect of the vaginal cuff and bladder, measuring approximately 4.3 x 2.9 cm in axial section, previously previously previously 3.7 x 1.8 cm when measured similarly. Extensive soft tissue thickening and stranding throughout the low pelvis. 2. Interval enlargement of a metastatic nodule within the right rectus abdominus. New small peritoneal nodule anterior to the sigmoid colon. 3. Interval enlargement of retroperitoneal retroperitoneal lymph nodes. 4. Multiple small bilateral pulmonary nodules, several of which are new and enlarged. 5. Constellation of findings above is consistent with worsened metastatic disease. 6. New left hydronephrosis and hydroureter, the distal left ureter obstructed by soft tissue near the left aspect of the urinary bladder and vaginal cuff. 7. Unchanged enlarged, partially calcified mediastinal and hilar lymph nodes,, in keeping with prior granulomatous infection or sarcoidosis. 8. Mild underlying pulmonary fibrosis, in a UIP pattern. 9. Coronary artery disease.   02/02/2021 -  Chemotherapy    Patient is on Treatment Plan: OVARIAN CARBOPLATIN / DOCETAXEL Q21D       04/17/2021 Imaging   1. Interval decrease  in size of multiple pulmonary nodules, with resolution of some previously seen nodules. 2. Interval decrease in size of enlarged retroperitoneal lymph nodes. 3. Interval decrease in size of soft tissue at the left aspect of the vaginal cuff status post hysterectomy. 4. Interval decrease in size of a soft tissue nodule within the posterior aspect of the right rectus abdominus muscle body. 5. Constellation of findings is consistent with treatment response of locally recurrent, nodal, and distant metastatic disease. 6. There has been interval resolution of previously seen left hydronephrosis and hydroureter. 7. 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     02/02/2021 -  Chemotherapy    Patient is on Treatment Plan: OVARIAN CARBOPLATIN / DOCETAXEL Q21D         REVIEW OF SYSTEMS:   Constitutional: Denies fevers, chills or abnormal weight loss Eyes: Denies blurriness of vision Ears, nose, mouth, throat, and face: Denies mucositis or sore throat Respiratory: Denies cough, dyspnea or wheezes Cardiovascular: Denies palpitation, chest discomfort  Gastrointestinal:  Denies nausea, heartburn or change in bowel habits Skin: Denies abnormal skin rashes  Lymphatics: Denies new lymphadenopathy or easy bruising Neurological:Denies numbness, tingling or new  weaknesses Behavioral/Psych: Mood is stable, no new changes  All other systems were reviewed with the patient and are negative.  I have reviewed the past medical history, past surgical history, social history and family history with the patient and they are unchanged from previous note.  ALLERGIES:  is allergic to shellfish allergy and penicillins.  MEDICATIONS:  Current Outpatient Medications  Medication Sig Dispense Refill   albuterol (VENTOLIN HFA) 108 (90 Base) MCG/ACT inhaler INHALE 2 PUFFS INTO THE LUNGS EVERY 6 HOURS AS NEEDED FOR WHEEZING OR SHORTNESS OF BREATH (NEED MD APPOINTMENT) 3 each 0   bisoprolol (ZEBETA) 10 MG tablet Take 1 tablet (10 mg total) by mouth in the morning and at bedtime. 180 tablet 0   budesonide-formoterol (SYMBICORT) 80-4.5 MCG/ACT inhaler Inhale 2 puffs into the lungs 2 (two) times daily. 3 Inhaler 3   desonide (DESOWEN) 0.05 % lotion Apply topically 2 (two) times daily. Continue following with dermatologist 59 mL 0   dexamethasone (DECADRON) 4 MG tablet TAKE 2 TABLETS BY MOUTH 2 TIMES DAILY THE DAY BEFORE TAXOTERE THEN ONCE DAILY FOR 3 DAYS AFTER CARBOPLATIN CHEMO 30 tablet 1   dextromethorphan (DELSYM) 30 MG/5ML liquid Take by mouth as needed for cough.      hydrochlorothiazide (HYDRODIURIL) 25 MG tablet Take 25 mg by mouth daily.     hydrocortisone 2.5 % cream Apply 1 application topically 2 (two) times daily.     loratadine (CLARITIN) 10 MG tablet Take 10 mg by mouth daily as needed for allergies.     morphine (MSIR) 15 MG tablet TAKE 1 TABLET BY MOUTH EVERY 4 HOURS AS NEEDED FOR SEVERE PAIN 90 tablet 0   ondansetron (ZOFRAN) 8 MG tablet Take 1 tablet (8 mg total) by mouth every 8 (eight) hours as needed for nausea. 30 tablet 3   pantoprazole (PROTONIX) 40 MG tablet Take 1 tablet (40 mg total) by mouth daily. Patient needs appt for further refills. 90 tablet 0   polyethylene glycol (MIRALAX / GLYCOLAX) 17 g packet Take 17 g by mouth daily.     predniSONE  (DELTASONE) 2.5 MG tablet Take 1 tablet (2.5 mg total) by mouth daily with breakfast. 90 tablet 3   prochlorperazine (COMPAZINE) 10 MG tablet TAKE 1 TABLET BY MOUTH EVERY 6 HOURS AS NEEDED FOR NAUSEA OR VOMITING 60 tablet 3   RABEprazole (ACIPHEX) 20 MG tablet Take 1 tablet (20 mg total) by mouth daily before breakfast. 90 tablet 3   spironolactone (ALDACTONE) 25 MG tablet Take 1 tablet by mouth daily. 30 tablet 1   vitamin C (ASCORBIC ACID) 500 MG tablet Take 500 mg by mouth daily.     No current facility-administered medications for this visit.   Facility-Administered Medications Ordered in Other Visits  Medication Dose Route Frequency Provider Last Rate Last Admin   CARBOplatin (PARAPLATIN) 360 mg in sodium chloride 0.9 % 250 mL chemo infusion  360 mg Intravenous Once Alvy Bimler, Toron Bowring, MD       dexamethasone (DECADRON) injection 5 mg  5 mg Intravenous Once Alvy Bimler, Keymani Glynn, MD       diphenhydrAMINE (BENADRYL) injection 25 mg  25 mg Intravenous Once Alvy Bimler, Kambryn Dapolito, MD       DOCEtaxel (TAXOTERE) 100 mg in sodium chloride 0.9 % 250 mL chemo infusion  48 mg/m2 (Treatment Plan Recorded) Intravenous Once Alvy Bimler, Martasia Talamante, MD       famotidine (PEPCID) IVPB 20 mg in NS 100 mL  IVPB  20 mg Intravenous Once Alvy Bimler, Kevia Zaucha, MD       fosaprepitant (EMEND) 150 mg in sodium chloride 0.9 % 145 mL IVPB  150 mg Intravenous Once Alvy Bimler, Nasif Bos, MD       heparin lock flush 100 unit/mL  500 Units Intracatheter Once PRN Alvy Bimler, Lashawndra Lampkins, MD       palonosetron (ALOXI) injection 0.25 mg  0.25 mg Intravenous Once Alvy Bimler, Khalif Stender, MD       sodium chloride flush (NS) 0.9 % injection 10 mL  10 mL Intracatheter PRN Alvy Bimler, Bellany Elbaum, MD        PHYSICAL EXAMINATION: ECOG PERFORMANCE STATUS: 2 - Symptomatic, <50% confined to bed  Vitals:   07/13/21 1048  BP: (!) 184/82  Pulse: 76  Resp: 18  Temp: 98.6 F (37 C)  SpO2: 100%   Filed Weights   07/13/21 1048  Weight: 227 lb 3.2 oz (103.1 kg)    GENERAL:alert, no distress and comfortable SKIN: skin  color, texture, turgor are normal, no rashes or significant lesions EYES: normal, Conjunctiva are pink and non-injected, sclera clear OROPHARYNX:no exudate, no erythema and lips, buccal mucosa, and tongue normal  NECK: supple, thyroid normal size, non-tender, without nodularity LYMPH:  no palpable lymphadenopathy in the cervical, axillary or inguinal LUNGS: clear to auscultation and percussion with normal breathing effort HEART: regular rate & rhythm and no murmurs with moderate bilateral lower extremity edema ABDOMEN:abdomen soft, non-tender and normal bowel sounds Musculoskeletal:no cyanosis of digits and no clubbing  NEURO: alert & oriented x 3 with fluent speech, no focal motor/sensory deficits  LABORATORY DATA:  I have reviewed the data as listed    Component Value Date/Time   NA 139 07/13/2021 1028   K 5.2 (H) 07/13/2021 1028   CL 106 07/13/2021 1028   CO2 22 07/13/2021 1028   GLUCOSE 247 (H) 07/13/2021 1028   BUN 26 (H) 07/13/2021 1028   CREATININE 0.99 07/13/2021 1028   CALCIUM 10.3 07/13/2021 1028   PROT 7.8 07/13/2021 1028   ALBUMIN 3.6 07/13/2021 1028   AST 17 07/13/2021 1028   ALT 22 07/13/2021 1028   ALKPHOS 105 07/13/2021 1028   BILITOT 0.4 07/13/2021 1028   GFRNONAA >60 07/13/2021 1028   GFRAA >60 09/08/2020 0819    No results found for: SPEP, UPEP  Lab Results  Component Value Date   WBC 6.6 07/13/2021   NEUTROABS 5.8 07/13/2021   HGB 8.8 (L) 07/13/2021   HCT 26.5 (L) 07/13/2021   MCV 103.9 (H) 07/13/2021   PLT 169 07/13/2021      Chemistry      Component Value Date/Time   NA 139 07/13/2021 1028   K 5.2 (H) 07/13/2021 1028   CL 106 07/13/2021 1028   CO2 22 07/13/2021 1028   BUN 26 (H) 07/13/2021 1028   CREATININE 0.99 07/13/2021 1028      Component Value Date/Time   CALCIUM 10.3 07/13/2021 1028   ALKPHOS 105 07/13/2021 1028   AST 17 07/13/2021 1028   ALT 22 07/13/2021 1028   BILITOT 0.4 07/13/2021 1028

## 2021-07-13 NOTE — Assessment & Plan Note (Signed)
She has stable pancytopenia and neuropathy from treatment Plan to continue on reduced dose of carboplatin I plan to repeat imaging study in August

## 2021-07-13 NOTE — Assessment & Plan Note (Signed)
Neuropathy is stable Observe closely

## 2021-07-13 NOTE — Assessment & Plan Note (Signed)
She has profound weight gain due to steroid treatment We discussed importance of dietary modification and weight loss

## 2021-07-13 NOTE — Assessment & Plan Note (Signed)
After significant transfusion support, her anemia is stable She does not need blood transfusion today but might needed in her next visit

## 2021-07-25 ENCOUNTER — Ambulatory Visit (HOSPITAL_COMMUNITY)
Admission: RE | Admit: 2021-07-25 | Discharge: 2021-07-25 | Disposition: A | Discharge status: Home / Self Care | Payer: Medicare HMO | Source: Ambulatory Visit | Attending: Hematology and Oncology | Admitting: Hematology and Oncology

## 2021-07-25 ENCOUNTER — Inpatient Hospital Stay: Payer: Medicare HMO | Attending: Hematology and Oncology

## 2021-07-25 ENCOUNTER — Other Ambulatory Visit: Payer: Self-pay

## 2021-07-25 DIAGNOSIS — C7802 Secondary malignant neoplasm of left lung: Secondary | ICD-10-CM

## 2021-07-25 DIAGNOSIS — Z79899 Other long term (current) drug therapy: Secondary | ICD-10-CM | POA: Diagnosis not present

## 2021-07-25 DIAGNOSIS — T451X5A Adverse effect of antineoplastic and immunosuppressive drugs, initial encounter: Secondary | ICD-10-CM | POA: Diagnosis not present

## 2021-07-25 DIAGNOSIS — J984 Other disorders of lung: Secondary | ICD-10-CM | POA: Insufficient documentation

## 2021-07-25 DIAGNOSIS — D869 Sarcoidosis, unspecified: Secondary | ICD-10-CM | POA: Diagnosis not present

## 2021-07-25 DIAGNOSIS — I700 Atherosclerosis of aorta: Secondary | ICD-10-CM | POA: Insufficient documentation

## 2021-07-25 DIAGNOSIS — Z880 Allergy status to penicillin: Secondary | ICD-10-CM | POA: Diagnosis not present

## 2021-07-25 DIAGNOSIS — I10 Essential (primary) hypertension: Secondary | ICD-10-CM | POA: Insufficient documentation

## 2021-07-25 DIAGNOSIS — R590 Localized enlarged lymph nodes: Secondary | ICD-10-CM | POA: Insufficient documentation

## 2021-07-25 DIAGNOSIS — C541 Malignant neoplasm of endometrium: Secondary | ICD-10-CM | POA: Insufficient documentation

## 2021-07-25 DIAGNOSIS — K449 Diaphragmatic hernia without obstruction or gangrene: Secondary | ICD-10-CM | POA: Diagnosis not present

## 2021-07-25 DIAGNOSIS — C775 Secondary and unspecified malignant neoplasm of intrapelvic lymph nodes: Secondary | ICD-10-CM | POA: Diagnosis not present

## 2021-07-25 DIAGNOSIS — I2510 Atherosclerotic heart disease of native coronary artery without angina pectoris: Secondary | ICD-10-CM | POA: Diagnosis not present

## 2021-07-25 DIAGNOSIS — R600 Localized edema: Secondary | ICD-10-CM | POA: Insufficient documentation

## 2021-07-25 DIAGNOSIS — Z9049 Acquired absence of other specified parts of digestive tract: Secondary | ICD-10-CM | POA: Insufficient documentation

## 2021-07-25 DIAGNOSIS — Z923 Personal history of irradiation: Secondary | ICD-10-CM | POA: Insufficient documentation

## 2021-07-25 DIAGNOSIS — G620 Drug-induced polyneuropathy: Secondary | ICD-10-CM | POA: Diagnosis not present

## 2021-07-25 DIAGNOSIS — D61818 Other pancytopenia: Secondary | ICD-10-CM | POA: Diagnosis not present

## 2021-07-25 DIAGNOSIS — D6481 Anemia due to antineoplastic chemotherapy: Secondary | ICD-10-CM

## 2021-07-25 LAB — CBC WITH DIFFERENTIAL (CANCER CENTER ONLY)
Abs Immature Granulocytes: 0.01 10*3/uL (ref 0.00–0.07)
Basophils Absolute: 0 10*3/uL (ref 0.0–0.1)
Basophils Relative: 0 %
Eosinophils Absolute: 0 10*3/uL (ref 0.0–0.5)
Eosinophils Relative: 0 %
HCT: 25.2 % — ABNORMAL LOW (ref 36.0–46.0)
Hemoglobin: 8.4 g/dL — ABNORMAL LOW (ref 12.0–15.0)
Immature Granulocytes: 0 %
Lymphocytes Relative: 25 %
Lymphs Abs: 0.9 10*3/uL (ref 0.7–4.0)
MCH: 34.7 pg — ABNORMAL HIGH (ref 26.0–34.0)
MCHC: 33.3 g/dL (ref 30.0–36.0)
MCV: 104.1 fL — ABNORMAL HIGH (ref 80.0–100.0)
Monocytes Absolute: 0.9 10*3/uL (ref 0.1–1.0)
Monocytes Relative: 25 %
Neutro Abs: 1.7 10*3/uL (ref 1.7–7.7)
Neutrophils Relative %: 50 %
Platelet Count: 124 10*3/uL — ABNORMAL LOW (ref 150–400)
RBC: 2.42 MIL/uL — ABNORMAL LOW (ref 3.87–5.11)
RDW: 18.8 % — ABNORMAL HIGH (ref 11.5–15.5)
WBC Count: 3.4 10*3/uL — ABNORMAL LOW (ref 4.0–10.5)
nRBC: 0.6 % — ABNORMAL HIGH (ref 0.0–0.2)

## 2021-07-25 LAB — CMP (CANCER CENTER ONLY)
ALT: 23 U/L (ref 0–44)
AST: 17 U/L (ref 15–41)
Albumin: 3.5 g/dL (ref 3.5–5.0)
Alkaline Phosphatase: 90 U/L (ref 38–126)
Anion gap: 12 (ref 5–15)
BUN: 21 mg/dL (ref 8–23)
CO2: 20 mmol/L — ABNORMAL LOW (ref 22–32)
Calcium: 9.5 mg/dL (ref 8.9–10.3)
Chloride: 109 mmol/L (ref 98–111)
Creatinine: 0.84 mg/dL (ref 0.44–1.00)
GFR, Estimated: >60 mL/min (ref >60)
Glucose, Bld: 129 mg/dL — ABNORMAL HIGH (ref 70–99)
Potassium: 4.3 mmol/L (ref 3.5–5.1)
Sodium: 141 mmol/L (ref 135–145)
Total Bilirubin: 0.4 mg/dL (ref 0.3–1.2)
Total Protein: 7.5 g/dL (ref 6.5–8.1)

## 2021-07-25 LAB — SAMPLE TO BLOOD BANK

## 2021-07-25 MED ORDER — SODIUM CHLORIDE 0.9% FLUSH
10.0000 mL | Freq: Once | INTRAVENOUS | Status: AC
  Administered 2021-07-25: 10 mL
  Filled 2021-07-25: qty 10

## 2021-07-25 MED ORDER — HEPARIN SOD (PORK) LOCK FLUSH 100 UNIT/ML IV SOLN: 500.0000 [IU] | Freq: Once | INTRAVENOUS | Status: AC

## 2021-07-25 MED ORDER — IOHEXOL 350 MG/ML SOLN
100.0000 mL | Freq: Once | INTRAVENOUS | Status: AC | PRN
  Administered 2021-07-25: 80 mL via INTRAVENOUS

## 2021-07-25 MED ORDER — HEPARIN SOD (PORK) LOCK FLUSH 100 UNIT/ML IV SOLN
INTRAVENOUS | Status: AC
  Administered 2021-07-25: 500 [IU] via INTRAVENOUS
  Filled 2021-07-25: qty 5

## 2021-07-27 ENCOUNTER — Encounter: Payer: Self-pay | Admitting: Hematology and Oncology

## 2021-07-27 ENCOUNTER — Inpatient Hospital Stay (HOSPITAL_BASED_OUTPATIENT_CLINIC_OR_DEPARTMENT_OTHER): Payer: Medicare HMO | Admitting: Hematology and Oncology

## 2021-07-27 ENCOUNTER — Other Ambulatory Visit: Payer: Self-pay

## 2021-07-27 ENCOUNTER — Inpatient Hospital Stay: Payer: Medicare HMO

## 2021-07-27 DIAGNOSIS — T451X5A Adverse effect of antineoplastic and immunosuppressive drugs, initial encounter: Secondary | ICD-10-CM

## 2021-07-27 DIAGNOSIS — D61818 Other pancytopenia: Secondary | ICD-10-CM

## 2021-07-27 DIAGNOSIS — C775 Secondary and unspecified malignant neoplasm of intrapelvic lymph nodes: Secondary | ICD-10-CM | POA: Diagnosis not present

## 2021-07-27 DIAGNOSIS — G620 Drug-induced polyneuropathy: Secondary | ICD-10-CM

## 2021-07-27 DIAGNOSIS — C541 Malignant neoplasm of endometrium: Secondary | ICD-10-CM

## 2021-07-27 DIAGNOSIS — C7802 Secondary malignant neoplasm of left lung: Secondary | ICD-10-CM | POA: Diagnosis not present

## 2021-07-27 DIAGNOSIS — R600 Localized edema: Secondary | ICD-10-CM | POA: Diagnosis not present

## 2021-07-27 DIAGNOSIS — I10 Essential (primary) hypertension: Secondary | ICD-10-CM

## 2021-07-27 DIAGNOSIS — D869 Sarcoidosis, unspecified: Secondary | ICD-10-CM

## 2021-07-27 NOTE — Assessment & Plan Note (Signed)
She is dependent on prednisone due to sarcoidosis We discussed prednisone taper If she is able to get her prednisone taper completed, she is a candidate for immunotherapy

## 2021-07-27 NOTE — Progress Notes (Signed)
[place] OFFICE PROGRESS NOTE  Patient Care Team: [name], [name], MD as PCP - General (Family Medicine)  ASSESSMENT & PLAN:  Endometrial cancer [facility]) She has achieved maximum response to therapy At this point in time, moving forward, I do not see much benefit to continue on current prescribed regimen She is getting significant, severe pancytopenia and worsening neuropathy She also have uncontrolled hypertension If we are able to get her taper off prednisone, I will prescribe Lenvima and pembrolizumab for long-term therapy I plan to see her again in a month for further follow-up We discussed briefly expected side effects of Lenvima and pembrolizumab  Pancytopenia, acquired ([place]) This is due to side effects of treatment She does not need transfusion support today  Peripheral neuropathy due to chemotherapy [facility]) She has slight worsening peripheral neuropathy I recommend discontinuation of chemotherapy  Sarcoidosis ([place]) She is dependent on prednisone due to sarcoidosis We discussed prednisone taper If she is able to get her prednisone taper completed, she is a candidate for immunotherapy  Essential hypertension She has chronic bilateral lower extremity edema and uncontrolled hypertension I am hopeful with prednisone taper, this will improve  No orders of the defined types were placed in this encounter.   All questions were answered. The patient knows to call the clinic with any problems, questions or concerns. The total time spent in the appointment was 30 minutes encounter with patients including review of chart and various tests results, discussions about plan of care and coordination of care plan   [name], MD 07/27/2021 10:05 AM  INTERVAL HISTORY: Please see below for problem oriented charting. She returns with family for further follow-up She complained of worsening peripheral neuropathy No recent falls Her blood pressure remains elevated No  recent nausea or vomiting  SUMMARY OF ONCOLOGIC HISTORY: Oncology History Overview Note  ER/PR neg, MSI stable   Endometrial cancer ([place])  06/15/2018 Surgery   Surgeon: Donaciano Eva     Operation: Robotic-assisted laparoscopic total hysterectomy with bilateral salpingoophorectomy, SLN biopsy     Operative Findings:  : 6cm uterus, normal tubes and ovaries, omentum adherent to the anterior abdominal wall and uterine fundus. No suspicious nodes.   06/15/2018 Pathology Results   Procedure: Total hysterectomy with bilateral salpingo-oophorectomy. Bilateral obturator sentinel lymph node biopsies. Histologic type: Endometrioid adenocarcinoma. Histologic Grade: FIGO grade 1. Myometrial invasion: Depth of invasion: 15 mm Myometrial thickness: 20 mm Uterine Serosa Involvement: Not identified. Cervical stromal involvement: Not identified. Extent of involvement of other organs: Uninvolved. Lymphovascular invasion: Present. Regional Lymph Nodes: Examined: 2 Sentinel 0 Non-sentinel 2 Total Lymph nodes with metastasis: 0 Isolated tumor cells (< 0.2 mm): 2 Micrometastasis: (> 0.2 mm and < 2.0 mm): 0 Macrometastasis: (> 2.0 mm): 0 Extracapsular extension: N/A. Tumor block for ancillary studies: 4Q-S. MMR / MSI testing: Will be ordered. Pathologic Stage Classification (pTNM, AJCC 8th edition): pT1b, pN0(i+)   07/20/2018 Imaging   1. Surgical changes from recent hysterectomy. No findings suspicious for residual tumor or adenopathy in the pelvis. 2. No CT findings to suggest omental, peritoneal surface or solid organ metastatic disease in the abdomen. 3. Changes consistent with known sarcoidosis in the chest with bulky calcified mediastinal and hilar lymph nodes and interstitial lung disease. I do not see any definite CT findings to suggest metastatic disease involving the chest.   08/19/2018 - 09/17/2018 Radiation Therapy   08/19/18 - 09/17/18: Vaginal Cuff, 3 cm cylinder with treatment length  of 3 cm / 30 Gy delivered  in 5 fractions of 6 Gy, Brachytherapy / HDR, Iridium-192   05/2005: Left Chest Wall keloid   04/2004 - 05/2004: Anus, pelvis /inguinal ( Dr. Tammi Klippel)   01/19/2020 Imaging   1. No definite CT findings of the abdomen or pelvis to explain pain. 2. The sigmoid colon and rectum are decompressed although slightly thickened appearing, suggestive of nonspecific infectious, inflammatory, or ischemic colitis. Correlate for referable clinical symptoms. 3. There is trace, nonspecific free fluid in the low pelvis, which may be reactive, although would be difficult to distinguish from postoperative and post treatment soft tissue change in the pelvis given history of hysterectomy and rectal cancer. 4. Unchanged prominent retroperitoneal and iliac lymph nodes. No new findings suspicious for metastatic disease in the abdomen or pelvis. 5. Bibasilar pulmonary fibrosis and mediastinal and hilar lymphadenopathy, better evaluated by prior CT chest and consistent with patient diagnosis of sarcoidosis.       04/18/2020 Imaging   Venous Doppler Left:  - No evidence of deep vein thrombosis seen in the left lower extremity, from the common femoral through the popliteal veins.  - No evidence of superficial venous thrombosis in the left lower extremity.  - There is no evidence of venous reflux seen in the left lower extremity.  - Rouleax flow noted in the superficial system.    05/18/2020 PET scan   1. Complex scan with a combination of hypermetabolic granulomatous disease (sarcoidosis) as well as hypermetabolic metastatic endometrial carcinoma. 2. Hypermetabolic mediastinal hilar lymph nodes and pulmonary nodularity with bronchovascular thickening is consistent with sarcoidosis. 3. Solitary hypermetabolic nodule in the medial LEFT lower lobe, new from prior with intense metabolic activity, is concerning for a metastatic deposit to the LEFT lower lobe. 4. Ill-defined hypermetabolic soft tissue along  the LEFT operator space is most consistent with local recurrence of endometrial carcinoma. There is entrapment of the LEFT ureter by this hypermetabolic soft tissue with mild hydroureter and hydronephrosis on the LEFT. 5. Additionally there appears to be direct extension from the hypermetabolic left operator space soft tissue to the sigmoid colon with there is a well-defined hypermetabolic lesion in the colon. 6. More distant hypermetabolic peritoneal metastasis along the ventral peritoneal surface of the RIGHT abdomen beneath RIGHT rectus abdominus muscle. 7. Hypermetabolic lymph nodes in the porta hepatis are favored related to sarcoidosis.   05/19/2020 Cancer Staging   Staging form: Corpus Uteri - Carcinoma and Carcinosarcoma, AJCC 8th Edition - Clinical stage from 05/19/2020: Stage IVB (rcT1, cN2a, cM1) - Signed by [name], MD on 05/19/2020   05/26/2020 Procedure   Successful placement of a right internal jugular approach power injectable Port-A-Cath. The catheter is ready for immediate use   05/29/2020 - 09/27/2020 Chemotherapy   The patient had carboplatin and taxol for chemotherapy treatment.     07/31/2020 Imaging   1. Asymmetric soft tissue mass extending from the left vaginal cuff has decreased in size in the interval. No residual peritoneal nodularity. Pulmonary metastases are stable with exception of a minimally improved left lower lobe nodule. 2. Stable soft tissue nodule associated with the right rectus abdominus muscle. 3. Interval resolution of left hydronephrosis. 4. Calcified mediastinal and hilar lymph nodes, together with pulmonary parenchymal fibrosis, findings which are in keeping with patient's known diagnosis of sarcoid. 5. Aortic atherosclerosis (ICD10-I70.0). Coronary artery calcification.   10/27/2020 Imaging   1. Essentially stable appearance of the soft tissue density nodule posteriorly in the left rectus abdominus muscle, currently 1.8 by 1.2 cm, previously 11 mm. 2.  Stable calcified adenopathy in the mediastinum and hila, compatible with prior granulomatous disease. 3. Stable small right lower lobe pulmonary nodules. 4. Stable soft tissue density prominence along the left vaginal cuff extending up towards the adnexa, probably therapy related. 5. Other imaging findings of potential clinical significance: Coronary atherosclerosis. Small type 1 hiatal hernia. Stable subpleural reticulation favoring the lung bases, possibly from mild fibrosis. 6. Aortic atherosclerosis.   01/19/2021 Imaging   1. Status post hysterectomy. Interval enlargement of soft tissue adjacent to the left aspect of the vaginal cuff and bladder, measuring approximately 4.3 x 2.9 cm in axial section, previously previously previously 3.7 x 1.8 cm when measured similarly. Extensive soft tissue thickening and stranding throughout the low pelvis. 2. Interval enlargement of a metastatic nodule within the right rectus abdominus. New small peritoneal nodule anterior to the sigmoid colon. 3. Interval enlargement of retroperitoneal retroperitoneal lymph nodes. 4. Multiple small bilateral pulmonary nodules, several of which are new and enlarged. 5. Constellation of findings above is consistent with worsened metastatic disease. 6. New left hydronephrosis and hydroureter, the distal left ureter obstructed by soft tissue near the left aspect of the urinary bladder and vaginal cuff. 7. Unchanged enlarged, partially calcified mediastinal and hilar lymph nodes,, in keeping with prior granulomatous infection or sarcoidosis. 8. Mild underlying pulmonary fibrosis, in a UIP pattern. 9. Coronary artery disease.   02/02/2021 - 07/13/2021 Chemotherapy    Patient is on carboplatin and docetaxel    04/17/2021 Imaging   1. Interval decrease in size of multiple pulmonary nodules, with resolution of some previously seen nodules. 2. Interval decrease in size of enlarged retroperitoneal lymph nodes. 3. Interval decrease in  size of soft tissue at the left aspect of the vaginal cuff status post hysterectomy. 4. Interval decrease in size of a soft tissue nodule within the posterior aspect of the right rectus abdominus muscle body. 5. Constellation of findings is consistent with treatment response of locally recurrent, nodal, and distant metastatic disease. 6. There has been interval resolution of previously seen left hydronephrosis and hydroureter. 7. 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   Stable mild partially calcified mediastinal and bilateral hilar lymphadenopathy.   Stable sub-cm abdominal retroperitoneal lymph nodes.   Aortic Atherosclerosis (ICD10-I70.0).     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     02/02/2021 - 07/13/2021 Chemotherapy    Patient is on carboplatin and docetaxel      REVIEW OF SYSTEMS:   Constitutional: Denies fevers, chills or abnormal weight loss Eyes: Denies blurriness of vision Ears, nose, mouth, throat, and face: Denies mucositis or sore throat Respiratory: Denies cough, dyspnea or wheezes Cardiovascular: Denies palpitation, chest discomfort or lower extremity swelling Gastrointestinal:  Denies nausea, heartburn or change in bowel habits Skin: Denies abnormal skin rashes Lymphatics: Denies new lymphadenopathy or easy  bruising Behavioral/Psych: Mood is stable, no new changes  All  other systems were reviewed with the patient and are negative.  I have reviewed the past medical history, past surgical history, social history and family history with the patient and they are unchanged from previous note.  ALLERGIES:  is allergic to shellfish allergy and penicillins.  MEDICATIONS:  Current Outpatient Medications  Medication Sig Dispense Refill   albuterol (VENTOLIN HFA) 108 (90 Base) MCG/ACT inhaler INHALE 2 PUFFS INTO THE LUNGS EVERY 6 HOURS AS NEEDED FOR WHEEZING OR SHORTNESS OF BREATH (NEED MD APPOINTMENT) 3 each 0   bisoprolol (ZEBETA) 10 MG tablet Take 1 tablet (10 mg total) by mouth in the morning and at bedtime. 180 tablet 0   budesonide-formoterol (SYMBICORT) 80-4.5 MCG/ACT inhaler Inhale 2 puffs into the lungs 2 (two) times daily. 3 Inhaler 3   desonide (DESOWEN) 0.05 % lotion Apply topically 2 (two) times daily. Continue following with dermatologist 59 mL 0   dextromethorphan (DELSYM) 30 MG/5ML liquid Take by mouth as needed for cough.      hydrochlorothiazide (HYDRODIURIL) 25 MG tablet Take 25 mg by mouth daily.     hydrocortisone 2.5 % cream Apply 1 application topically 2 (two) times daily.     loratadine (CLARITIN) 10 MG tablet Take 10 mg by mouth daily as needed for allergies.     ondansetron (ZOFRAN) 8 MG tablet Take 1 tablet (8 mg total) by mouth every 8 (eight) hours as needed for nausea. 30 tablet 3   pantoprazole (PROTONIX) 40 MG tablet Take 1 tablet (40 mg total) by mouth daily. Patient needs appt for further refills. 90 tablet 0   polyethylene glycol (MIRALAX / GLYCOLAX) 17 g packet Take 17 g by mouth daily.     predniSONE (DELTASONE) 2.5 MG tablet Take 1 tablet (2.5 mg total) by mouth daily with breakfast. 90 tablet 3   prochlorperazine (COMPAZINE) 10 MG tablet TAKE 1 TABLET BY MOUTH EVERY 6 HOURS AS NEEDED FOR NAUSEA OR VOMITING 60 tablet 3   RABEprazole (ACIPHEX) 20 MG tablet Take 1  tablet (20 mg total) by mouth daily before breakfast. 90 tablet 3   spironolactone (ALDACTONE) 25 MG tablet Take 1 tablet by mouth daily. 30 tablet 1   vitamin C (ASCORBIC ACID) 500 MG tablet Take 500 mg by mouth daily.     No current facility-administered medications for this visit.    PHYSICAL EXAMINATION: ECOG PERFORMANCE STATUS: 1 - Symptomatic but completely ambulatory  Vitals:   07/27/21 0833  BP: (!) 176/73  Pulse: 78  Resp: 18  Temp: 98.5 F (36.9 C)  SpO2: 100%   Filed Weights   07/27/21 0833  Weight: 227 lb 3.2 oz (103.1 kg)    GENERAL:alert, no distress and comfortable HEART: she has moderate bilateral lower extremity edema NEURO: alert & oriented x 3 with fluent speech, no focal motor/sensory deficits  LABORATORY DATA:  I have reviewed the data as listed    Component Value Date/Time   NA 141 07/25/2021 0938   K 4.3 07/25/2021 0938   CL 109 07/25/2021 0938   CO2 20 (L) 07/25/2021 0938   GLUCOSE 129 (H) 07/25/2021 0938   BUN 21 07/25/2021 0938   CREATININE 0.84 07/25/2021 0938   CALCIUM 9.5 07/25/2021 0938   PROT 7.5 07/25/2021 0938   ALBUMIN 3.5 07/25/2021 0938   AST 17 07/25/2021 0938   ALT 23 07/25/2021 0938   ALKPHOS 90 07/25/2021 0938   BILITOT 0.4 07/25/2021 0938   GFRNONAA >60 07/25/2021 0938   GFRAA >60 09/08/2020 2542  No results found for: SPEP, UPEP  Lab Results  Component Value Date   WBC 3.4 (L) 07/25/2021   NEUTROABS 1.7 07/25/2021   HGB 8.4 (L) 07/25/2021   HCT 25.2 (L) 07/25/2021   MCV 104.1 (H) 07/25/2021   PLT 124 (L) 07/25/2021      Chemistry      Component Value Date/Time   NA 141 07/25/2021 0938   K 4.3 07/25/2021 0938   CL 109 07/25/2021 0938   CO2 20 (L) 07/25/2021 0938   BUN 21 07/25/2021 0938   CREATININE 0.84 07/25/2021 0938      Component Value Date/Time   CALCIUM 9.5 07/25/2021 0938   ALKPHOS 90 07/25/2021 0938   AST 17 07/25/2021 0938   ALT 23 07/25/2021 0938   BILITOT 0.4 07/25/2021 0938        RADIOGRAPHIC STUDIES: I have reviewed multiple CT imaging with the patient I have personally reviewed the radiological images as listed and agreed with the findings in the report. CT CHEST ABDOMEN PELVIS W CONTRAST  Result Date: 07/26/2021 CLINICAL DATA:  Follow-up endometrial carcinoma. Ongoing chemotherapy. Personal history of rectal carcinoma. EXAM: CT CHEST, ABDOMEN, AND PELVIS WITH CONTRAST TECHNIQUE: Multidetector CT imaging of the chest, abdomen and pelvis was performed following the standard protocol during bolus administration of intravenous contrast. CONTRAST:  55m OMNIPAQUE IOHEXOL 350 MG/ML SOLN COMPARISON:  04/17/2021 FINDINGS: CT CHEST FINDINGS Cardiovascular: No acute findings. Aortic and coronary atherosclerotic calcification noted. Mediastinum/Lymph Nodes: Partially calcified mild mediastinal and bilateral hilar lymphadenopathy remain stable since prior study. No new or increased sites of lymphadenopathy identified. Lungs/Pleura: Bilateral pleural-parenchymal scarring remains stable. No suspicious pulmonary nodules or masses identified. No evidence of infiltrate or pleural effusion. Musculoskeletal:  No suspicious bone lesions identified. CT ABDOMEN AND PELVIS FINDINGS Hepatobiliary: No masses identified. Prior cholecystectomy. No evidence of biliary obstruction. Pancreas:  No mass or inflammatory changes. Spleen:  Within normal limits in size and appearance. Adrenals/Urinary tract:  No masses or hydronephrosis. Stomach/Bowel: No evidence of obstruction, inflammatory process, or abnormal fluid collections. Normal appendix visualized. Vascular/Lymphatic: Stable sub-cm retroperitoneal lymph nodes, largest in right pericaval region measuring 9 mm. No pathologically enlarged lymph nodes identified. No acute vascular findings. Aortic atherosclerotic calcification noted. Reproductive: Prior hysterectomy. Post radiation changes in the pelvis are stable. No evidence of pelvic mass or free  fluid. Other:  None. Musculoskeletal:  No suspicious bone lesions identified. IMPRESSION: No new or progressive disease within the chest, abdomen, or pelvis. Stable mild partially calcified mediastinal and bilateral hilar lymphadenopathy. Stable sub-cm abdominal retroperitoneal lymph nodes. Aortic Atherosclerosis (ICD10-I70.0). Electronically Signed   By: JMarlaine HindM.D.   On: 07/26/2021 15:49

## 2021-07-27 NOTE — Assessment & Plan Note (Signed)
She has achieved maximum response to therapy At this point in time, moving forward, I do not see much benefit to continue on current prescribed regimen She is getting significant, severe pancytopenia and worsening neuropathy She also have uncontrolled hypertension If we are able to get her taper off prednisone, I will prescribe Lenvima and pembrolizumab for long-term therapy I plan to see her again in a month for further follow-up We discussed briefly expected side effects of Lenvima and pembrolizumab

## 2021-07-27 NOTE — Assessment & Plan Note (Signed)
She has slight worsening peripheral neuropathy I recommend discontinuation of chemotherapy

## 2021-07-27 NOTE — Assessment & Plan Note (Signed)
She has chronic bilateral lower extremity edema and uncontrolled hypertension I am hopeful with prednisone taper, this will improve

## 2021-07-27 NOTE — Assessment & Plan Note (Signed)
This is due to side effects of treatment She does not need transfusion support today

## 2021-08-09 ENCOUNTER — Telehealth: Payer: Self-pay

## 2021-08-09 NOTE — Telephone Encounter (Signed)
-----   Message from [name], MD sent at 08/09/2021 10:31 AM EDT ----- How is her BP doing? Is she able to be off prednisone completely without problems?

## 2021-08-09 NOTE — Telephone Encounter (Signed)
Called and left a message asking her to call the office back. 

## 2021-08-10 ENCOUNTER — Encounter: Payer: Self-pay | Admitting: Hematology and Oncology

## 2021-08-10 NOTE — Telephone Encounter (Signed)
Called and left another message asking her to call the office back.  

## 2021-08-13 ENCOUNTER — Telehealth: Payer: Self-pay | Admitting: Oncology

## 2021-08-13 NOTE — Telephone Encounter (Signed)
OK, thanks for the update, will call her next week

## 2021-08-13 NOTE — Telephone Encounter (Signed)
[name] and she said she was at the beach last [place] and did not have good cell reception.  She said she is doing good and has not had any problems tapering the prednisone.  She will take her last tablet on Wednesday.  She did have an episode of shortness of breath yesterday and used her inhaler with "knocked it out."    She said her BP has been good.  She last checked it on Wednesday and thinks it was 150/68.  She is going to start checking it daily.

## 2021-08-16 ENCOUNTER — Encounter: Payer: Self-pay | Admitting: Hematology and Oncology

## 2021-08-21 ENCOUNTER — Telehealth: Payer: Self-pay

## 2021-08-21 NOTE — Telephone Encounter (Signed)
Called and left below message. Ask her to call the office back. 

## 2021-08-21 NOTE — Telephone Encounter (Signed)
She called back. Given below message. She verbalized understanding. She is off the prednisone and doing well. She has a cough at times. She uses a inhaler prn. BP today am 123/61 9/5 bp 134/75 9/4 bp 179/90, stressed from death in the family. 09-11-2023 bp 180/90, she went to funeral 9/2 bp 134/80

## 2021-08-21 NOTE — Telephone Encounter (Signed)
Called and left another message asking her to call the office back.  

## 2021-08-21 NOTE — Telephone Encounter (Signed)
-----   Message from [name], MD sent at 08/21/2021  9:47 AM EDT ----- Can you call and ask if she did ok without prednisone? If so, we can get her Lenvima and pemborlizumab

## 2021-08-22 ENCOUNTER — Telehealth: Payer: Self-pay

## 2021-08-22 ENCOUNTER — Encounter: Payer: Self-pay | Admitting: Hematology and Oncology

## 2021-08-22 ENCOUNTER — Other Ambulatory Visit: Payer: Self-pay | Admitting: Hematology and Oncology

## 2021-08-22 ENCOUNTER — Other Ambulatory Visit (HOSPITAL_COMMUNITY): Payer: Self-pay

## 2021-08-22 DIAGNOSIS — C541 Malignant neoplasm of endometrium: Secondary | ICD-10-CM

## 2021-08-22 DIAGNOSIS — C7802 Secondary malignant neoplasm of left lung: Secondary | ICD-10-CM

## 2021-08-22 DIAGNOSIS — C775 Secondary and unspecified malignant neoplasm of intrapelvic lymph nodes: Secondary | ICD-10-CM

## 2021-08-22 MED ORDER — LENVATINIB (20 MG DAILY DOSE) 2 X 10 MG PO CPPK
10.0000 mg | ORAL_CAPSULE | Freq: Every day | ORAL | 11 refills | Status: DC
Start: 2021-08-22 — End: 2022-02-20
  Filled 2021-08-22: qty 30, 30d supply, fill #0

## 2021-08-22 NOTE — Telephone Encounter (Signed)
Called and given below. She verbalized understanding.

## 2021-08-22 NOTE — Progress Notes (Signed)
DISCONTINUE OFF PATHWAY REGIMEN - Uterine   [id number]:Carboplatin + Docetaxel (5/75) every 21 days:   A cycle is every 21 days:     Docetaxel      Carboplatin   **Always confirm dose/schedule in your pharmacy ordering system**  REASON: Toxicities / Adverse Event PRIOR TREATMENT: Off Pathway: Carboplatin + Docetaxel (5/75) every 21 days TREATMENT RESPONSE: Partial Response ([facility])  START ON PATHWAY REGIMEN - Uterine     A cycle is every 21 days:     Lenvatinib      Pembrolizumab   **Always confirm dose/schedule in your pharmacy ordering system**  Patient Characteristics: Endometrioid, Recurrent/Progressive Disease, Third Line and Beyond, MSS/pMMR and TMB-L/Unknown Histology: Endometrioid Therapeutic Status: Recurrent or Progressive Disease Line of Therapy: Third Engineer, civil (consulting) Status: MSS/pMMR Tumor Mutational Burden (TMB): Unknown Intent of Therapy: Non-Curative / Palliative Intent, Discussed with Patient

## 2021-08-22 NOTE — Telephone Encounter (Signed)
Oral Oncology Pharmacist Encounter  Received new prescription for Lenvima (lenvatinib) for the treatment of endometrial cancer in conjunction with pembrolizumab, planned duration until disease progression or toxicity.  Prescription dose and frequency assessed, no adjustments needed.   Labs from 07/25/2021 assessed, no dose adjustments needed. Dose reduced to '10mg'$  daily for tolerability. Patient had previous uncontrolled hypertension on previous therapy.   Patient was tapering off prednisone and has been off since 08/21/21.  Current medication list in Epic reviewed, DDIs with albuterol and symbicort identified: Albuterol and Symbicort are QT prolonging agents with intermediate risk. Michel Santee is a high risk for QT prolongation. Last EKG from 06/19/2018 resulting in a QT/Qtc 448/476.   Evaluated chart and no patient barriers to medication adherence noted.   Patient agreement for treatment documented in MD note on 07/27/2021.  Prescription has been e-scribed to the [facility] for benefits analysis and approval.  Oral Oncology Clinic will continue to follow for insurance authorization, copayment issues, initial counseling and start date.  Drema Halon, PharmD Hematology/Oncology Clinical Pharmacist [facility] [phone]

## 2021-08-22 NOTE — Telephone Encounter (Signed)
I'm going to work on prior [place] for [employer] Tell her a pharmacist will be contacting her, do not start until after I see her

## 2021-08-22 NOTE — Telephone Encounter (Signed)
Oral Oncology Patient Advocate Encounter  Prior Authorization for [name] has been approved.    PA# [id number] Effective dates: 08/22/21 through 02/18/22  Patients co-pay is $3526.33  Oral Oncology Clinic will continue to follow.   [place] Patient [place] Phone [phone] Fax [phone] 08/22/2021 8:57 AM

## 2021-08-22 NOTE — Telephone Encounter (Signed)
Oral Oncology Patient Advocate Encounter   Received notification from [facility] that prior authorization for [name] is required.   PA submitted on CoverMyMeds Key [id number] Status is pending   Oral Oncology Clinic will continue to follow.  [place] Patient [place] Phone [phone] Fax [phone] 08/22/2021 8:42 AM

## 2021-08-24 ENCOUNTER — Telehealth: Payer: Self-pay

## 2021-08-24 NOTE — Telephone Encounter (Signed)
Patient is approved for Lenvima at no cost from [facility] 08/24/21-12/15/21.  [place] uses [employer].  [place] Patient [name] Phone [phone] Fax [phone] 08/24/2021 12:04 PM

## 2021-08-24 NOTE — Telephone Encounter (Signed)
Oral Oncology Patient Advocate Encounter  Met patient in lobby room to complete application for [employer] in an effort to reduce patient's out of pocket expense for Lenvima to $0.    Application completed and faxed to [phone].   E[employer] patient assistance phone number for follow up is [phone].   This encounter will be updated until final determination.   [place] Patient [place] Phone [phone] Fax [phone] 08/24/2021 7:49 AM

## 2021-08-28 ENCOUNTER — Other Ambulatory Visit: Payer: Self-pay | Admitting: Hematology and Oncology

## 2021-08-28 ENCOUNTER — Telehealth: Payer: Self-pay

## 2021-08-28 ENCOUNTER — Other Ambulatory Visit: Payer: Medicare HMO

## 2021-08-28 ENCOUNTER — Ambulatory Visit: Payer: Medicare HMO

## 2021-08-28 ENCOUNTER — Ambulatory Visit: Payer: Medicare HMO | Admitting: Hematology and Oncology

## 2021-08-28 NOTE — Telephone Encounter (Signed)
Appts canceled for today for labs and Dr. Alvy Bimler. She needs to be at home for delivery of Lenvima today. Instructed to call the office back once she has the medication and appts will be rescheduled. She verbalized understanding.

## 2021-09-03 ENCOUNTER — Ambulatory Visit: Payer: Medicare HMO | Admitting: Hematology and Oncology

## 2021-09-03 ENCOUNTER — Encounter: Payer: Self-pay | Admitting: Hematology and Oncology

## 2021-09-03 ENCOUNTER — Inpatient Hospital Stay: Payer: Medicare HMO | Admitting: Hematology and Oncology

## 2021-09-03 ENCOUNTER — Other Ambulatory Visit: Payer: Medicare HMO

## 2021-09-03 ENCOUNTER — Inpatient Hospital Stay: Payer: Medicare HMO

## 2021-09-03 ENCOUNTER — Other Ambulatory Visit: Payer: Self-pay

## 2021-09-03 ENCOUNTER — Inpatient Hospital Stay: Payer: Medicare HMO | Attending: Hematology and Oncology

## 2021-09-03 VITALS — BP 144/58 | HR 78 | Temp 99.9°F | Resp 18 | Ht 65.5 in | Wt 223.4 lb

## 2021-09-03 DIAGNOSIS — C775 Secondary and unspecified malignant neoplasm of intrapelvic lymph nodes: Secondary | ICD-10-CM

## 2021-09-03 DIAGNOSIS — C541 Malignant neoplasm of endometrium: Secondary | ICD-10-CM

## 2021-09-03 DIAGNOSIS — C7802 Secondary malignant neoplasm of left lung: Secondary | ICD-10-CM | POA: Insufficient documentation

## 2021-09-03 DIAGNOSIS — Z79899 Other long term (current) drug therapy: Secondary | ICD-10-CM | POA: Insufficient documentation

## 2021-09-03 DIAGNOSIS — I10 Essential (primary) hypertension: Secondary | ICD-10-CM

## 2021-09-03 DIAGNOSIS — D539 Nutritional anemia, unspecified: Secondary | ICD-10-CM | POA: Diagnosis not present

## 2021-09-03 DIAGNOSIS — D6481 Anemia due to antineoplastic chemotherapy: Secondary | ICD-10-CM

## 2021-09-03 DIAGNOSIS — Z5112 Encounter for antineoplastic immunotherapy: Secondary | ICD-10-CM | POA: Diagnosis not present

## 2021-09-03 LAB — CMP (CANCER CENTER ONLY)
ALT: 20 U/L (ref 0–44)
AST: 18 U/L (ref 15–41)
Albumin: 3.7 g/dL (ref 3.5–5.0)
Alkaline Phosphatase: 112 U/L (ref 38–126)
Anion gap: 12 (ref 5–15)
BUN: 19 mg/dL (ref 8–23)
CO2: 24 mmol/L (ref 22–32)
Calcium: 9.5 mg/dL (ref 8.9–10.3)
Chloride: 105 mmol/L (ref 98–111)
Creatinine: 1.03 mg/dL — ABNORMAL HIGH (ref 0.44–1.00)
GFR, Estimated: >60 mL/min (ref >60)
Glucose, Bld: 171 mg/dL — ABNORMAL HIGH (ref 70–99)
Potassium: 4.2 mmol/L (ref 3.5–5.1)
Sodium: 141 mmol/L (ref 135–145)
Total Bilirubin: 0.5 mg/dL (ref 0.3–1.2)
Total Protein: 8 g/dL (ref 6.5–8.1)

## 2021-09-03 LAB — CBC WITH DIFFERENTIAL (CANCER CENTER ONLY)
Abs Immature Granulocytes: 0.02 10*3/uL (ref 0.00–0.07)
Basophils Absolute: 0 10*3/uL (ref 0.0–0.1)
Basophils Relative: 0 %
Eosinophils Absolute: 0.1 10*3/uL (ref 0.0–0.5)
Eosinophils Relative: 2 %
HCT: 26.3 % — ABNORMAL LOW (ref 36.0–46.0)
Hemoglobin: 8.8 g/dL — ABNORMAL LOW (ref 12.0–15.0)
Immature Granulocytes: 0 %
Lymphocytes Relative: 17 %
Lymphs Abs: 1.1 10*3/uL (ref 0.7–4.0)
MCH: 34.2 pg — ABNORMAL HIGH (ref 26.0–34.0)
MCHC: 33.5 g/dL (ref 30.0–36.0)
MCV: 102.3 fL — ABNORMAL HIGH (ref 80.0–100.0)
Monocytes Absolute: 0.7 10*3/uL (ref 0.1–1.0)
Monocytes Relative: 11 %
Neutro Abs: 4.4 10*3/uL (ref 1.7–7.7)
Neutrophils Relative %: 70 %
Platelet Count: 184 10*3/uL (ref 150–400)
RBC: 2.57 MIL/uL — ABNORMAL LOW (ref 3.87–5.11)
RDW: 17.2 % — ABNORMAL HIGH (ref 11.5–15.5)
WBC Count: 6.3 10*3/uL (ref 4.0–10.5)
nRBC: 0 % (ref 0.0–0.2)

## 2021-09-03 LAB — TOTAL PROTEIN, URINE DIPSTICK: Protein, ur: 30 mg/dL — AB

## 2021-09-03 LAB — SAMPLE TO BLOOD BANK

## 2021-09-03 LAB — TSH: TSH: 2.98 u[IU]/mL (ref 0.308–3.960)

## 2021-09-03 MED ORDER — HEPARIN SOD (PORK) LOCK FLUSH 100 UNIT/ML IV SOLN
500.0000 [IU] | Freq: Once | INTRAVENOUS | Status: AC | PRN
  Administered 2021-09-03: 500 [IU]

## 2021-09-03 MED ORDER — SODIUM CHLORIDE 0.9 % IV SOLN
200.0000 mg | Freq: Once | INTRAVENOUS | Status: AC
  Administered 2021-09-03: 200 mg via INTRAVENOUS
  Filled 2021-09-03: qty 8

## 2021-09-03 MED ORDER — SODIUM CHLORIDE 0.9% FLUSH
10.0000 mL | Freq: Once | INTRAVENOUS | Status: AC
  Administered 2021-09-03: 10 mL

## 2021-09-03 MED ORDER — SODIUM CHLORIDE 0.9% FLUSH
10.0000 mL | INTRAVENOUS | Status: DC | PRN
  Administered 2021-09-03: 10 mL

## 2021-09-03 MED ORDER — SODIUM CHLORIDE 0.9 % IV SOLN: Freq: Once | INTRAVENOUS | Status: AC

## 2021-09-03 NOTE — Assessment & Plan Note (Signed)
Previously, we have extensive discussions about risks and benefits of combination of pembrolizumab and Lenvima She is able to get herself off prednisone without significant complications We will proceed with treatment as scheduled I will see her again in 3 weeks for further follow-up

## 2021-09-03 NOTE — Assessment & Plan Note (Signed)
Her blood pressure stable We will monitor carefully while on lenvatinib

## 2021-09-03 NOTE — Progress Notes (Signed)
[place] OFFICE PROGRESS NOTE  Patient Care Team: [name], [name], MD as PCP - General (Family Medicine)  ASSESSMENT & PLAN:  Endometrial cancer [facility]) Previously, we have extensive discussions about risks and benefits of combination of pembrolizumab and Lenvima She is able to get herself off prednisone without significant complications We will proceed with treatment as scheduled I will see her again in 3 weeks for further follow-up  Deficiency anemia She has significant persistent pancytopenia despite stopping treatment I plan to check B12 and iron studies in her next visit  Essential hypertension Her blood pressure stable We will monitor carefully while on lenvatinib  Orders Placed This Encounter  Procedures   Iron and TIBC    Standing Status:   Future    Standing Expiration Date:   09/17/2022   Vitamin B12    Standing Status:   Future    Standing Expiration Date:   09/17/2022   Ferritin    Standing Status:   Future    Standing Expiration Date:   09/17/2022    All questions were answered. The patient knows to call the clinic with any problems, questions or concerns. The total time spent in the appointment was 20 minutes encounter with patients including review of chart and various tests results, discussions about plan of care and coordination of care plan   [name], MD 09/03/2021 3:11 PM  INTERVAL HISTORY: Please see below for problem oriented charting. she returns for treatment follow-up to start on pembrolizumab with lenvatinib She tolerated recent prednisone taper well Denies cough, chest pain or shortness of breath No recent bleeding No abdominal bloating or changes in bowel habits Her documented blood pressure at home is within normal range  REVIEW OF SYSTEMS:   Constitutional: Denies fevers, chills or abnormal weight loss Eyes: Denies blurriness of vision Ears, nose, mouth, throat, and face: Denies mucositis or sore throat Respiratory:  Denies cough, dyspnea or wheezes Cardiovascular: Denies palpitation, chest discomfort or lower extremity swelling Gastrointestinal:  Denies nausea, heartburn or change in bowel habits Skin: Denies abnormal skin rashes Lymphatics: Denies new lymphadenopathy or easy bruising Neurological:Denies numbness, tingling or new weaknesses Behavioral/Psych: Mood is stable, no new changes  All other systems were reviewed with the patient and are negative.  I have reviewed the past medical history, past surgical history, social history and family history with the patient and they are unchanged from previous note.  ALLERGIES:  is allergic to shellfish allergy and penicillins.  MEDICATIONS:  Current Outpatient Medications  Medication Sig Dispense Refill   albuterol (VENTOLIN HFA) 108 (90 Base) MCG/ACT inhaler INHALE 2 PUFFS INTO THE LUNGS EVERY 6 HOURS AS NEEDED FOR WHEEZING OR SHORTNESS OF BREATH (NEED MD APPOINTMENT) 3 each 0   bisoprolol (ZEBETA) 10 MG tablet Take 1 tablet (10 mg total) by mouth in the morning and at bedtime. 180 tablet 0   budesonide-formoterol (SYMBICORT) 80-4.5 MCG/ACT inhaler Inhale 2 puffs into the lungs 2 (two) times daily. 3 Inhaler 3   desonide (DESOWEN) 0.05 % lotion Apply topically 2 (two) times daily. Continue following with dermatologist 59 mL 0   dextromethorphan (DELSYM) 30 MG/5ML liquid Take by mouth as needed for cough.      hydrochlorothiazide (HYDRODIURIL) 25 MG tablet Take 25 mg by mouth daily.     hydrocortisone 2.5 % cream Apply 1 application topically 2 (two) times daily.     lenvatinib 20 mg daily dose (LENVIMA) 2 x 10 MG capsule Take 1 capsule (10 mg total) by  mouth daily. 30 capsule 11   loratadine (CLARITIN) 10 MG tablet Take 10 mg by mouth daily as needed for allergies.     ondansetron (ZOFRAN) 8 MG tablet Take 1 tablet (8 mg total) by mouth every 8 (eight) hours as needed for nausea. 30 tablet 3   pantoprazole (PROTONIX) 40 MG tablet Take 1 tablet (40 mg  total) by mouth daily. Patient needs appt for further refills. 90 tablet 0   polyethylene glycol (MIRALAX / GLYCOLAX) 17 g packet Take 17 g by mouth daily.     prochlorperazine (COMPAZINE) 10 MG tablet TAKE 1 TABLET BY MOUTH EVERY 6 HOURS AS NEEDED FOR NAUSEA OR VOMITING 60 tablet 3   RABEprazole (ACIPHEX) 20 MG tablet Take 1 tablet (20 mg total) by mouth daily before breakfast. 90 tablet 3   spironolactone (ALDACTONE) 25 MG tablet Take 1 tablet by mouth daily. 30 tablet 1   vitamin C (ASCORBIC ACID) 500 MG tablet Take 500 mg by mouth daily.     No current facility-administered medications for this visit.   Facility-Administered Medications Ordered in Other Visits  Medication Dose Route Frequency Provider Last Rate Last Admin   heparin lock flush 100 unit/mL  500 Units Intracatheter Once PRN Alvy Bimler, Walida Cajas, MD       pembrolizumab (KEYTRUDA) 200 mg in sodium chloride 0.9 % 50 mL chemo infusion  200 mg Intravenous Once Alvy Bimler, Theresa Dohrman, MD 116 mL/hr at 09/03/21 1501 200 mg at 09/03/21 1501   sodium chloride flush (NS) 0.9 % injection 10 mL  10 mL Intracatheter PRN [name], MD        SUMMARY OF ONCOLOGIC HISTORY: Oncology History Overview Note  ER/PR neg, MSI stable   Endometrial cancer ([place])  06/15/2018 Surgery   Surgeon: Donaciano Eva     Operation: Robotic-assisted laparoscopic total hysterectomy with bilateral salpingoophorectomy, SLN biopsy     Operative Findings:  : 6cm uterus, normal tubes and ovaries, omentum adherent to the anterior abdominal wall and uterine fundus. No suspicious nodes.   06/15/2018 Pathology Results   Procedure: Total hysterectomy with bilateral salpingo-oophorectomy. Bilateral obturator sentinel lymph node biopsies. Histologic type: Endometrioid adenocarcinoma. Histologic Grade: FIGO grade 1. Myometrial invasion: Depth of invasion: 15 mm Myometrial thickness: 20 mm Uterine Serosa Involvement: Not identified. Cervical stromal involvement: Not  identified. Extent of involvement of other organs: Uninvolved. Lymphovascular invasion: Present. Regional Lymph Nodes: Examined: 2 Sentinel 0 Non-sentinel 2 Total Lymph nodes with metastasis: 0 Isolated tumor cells (< 0.2 mm): 2 Micrometastasis: (> 0.2 mm and < 2.0 mm): 0 Macrometastasis: (> 2.0 mm): 0 Extracapsular extension: N/A. Tumor block for ancillary studies: 4Q-S. MMR / MSI testing: Will be ordered. Pathologic Stage Classification (pTNM, AJCC 8th edition): pT1b, pN0(i+)   07/20/2018 Imaging   1. Surgical changes from recent hysterectomy. No findings suspicious for residual tumor or adenopathy in the pelvis. 2. No CT findings to suggest omental, peritoneal surface or solid organ metastatic disease in the abdomen. 3. Changes consistent with known sarcoidosis in the chest with bulky calcified mediastinal and hilar lymph nodes and interstitial lung disease. I do not see any definite CT findings to suggest metastatic disease involving the chest.   08/19/2018 - 09/17/2018 Radiation Therapy   08/19/18 - 09/17/18: Vaginal Cuff, 3 cm cylinder with treatment length of 3 cm / 30 Gy delivered in 5 fractions of 6 Gy, Brachytherapy / HDR, Iridium-192   05/2005: Left Chest Wall keloid   04/2004 - 05/2004: Anus, pelvis /inguinal ( Dr. Tammi Klippel)  01/19/2020 Imaging   1. No definite CT findings of the abdomen or pelvis to explain pain. 2. The sigmoid colon and rectum are decompressed although slightly thickened appearing, suggestive of nonspecific infectious, inflammatory, or ischemic colitis. Correlate for referable clinical symptoms. 3. There is trace, nonspecific free fluid in the low pelvis, which may be reactive, although would be difficult to distinguish from postoperative and post treatment soft tissue change in the pelvis given history of hysterectomy and rectal cancer. 4. Unchanged prominent retroperitoneal and iliac lymph nodes. No new findings suspicious for metastatic disease in the abdomen or  pelvis. 5. Bibasilar pulmonary fibrosis and mediastinal and hilar lymphadenopathy, better evaluated by prior CT chest and consistent with patient diagnosis of sarcoidosis.       04/18/2020 Imaging   Venous Doppler Left:  - No evidence of deep vein thrombosis seen in the left lower extremity, from the common femoral through the popliteal veins.  - No evidence of superficial venous thrombosis in the left lower extremity.  - There is no evidence of venous reflux seen in the left lower extremity.  - Rouleax flow noted in the superficial system.    05/18/2020 PET scan   1. Complex scan with a combination of hypermetabolic granulomatous disease (sarcoidosis) as well as hypermetabolic metastatic endometrial carcinoma. 2. Hypermetabolic mediastinal hilar lymph nodes and pulmonary nodularity with bronchovascular thickening is consistent with sarcoidosis. 3. Solitary hypermetabolic nodule in the medial LEFT lower lobe, new from prior with intense metabolic activity, is concerning for a metastatic deposit to the LEFT lower lobe. 4. Ill-defined hypermetabolic soft tissue along the LEFT operator space is most consistent with local recurrence of endometrial carcinoma. There is entrapment of the LEFT ureter by this hypermetabolic soft tissue with mild hydroureter and hydronephrosis on the LEFT. 5. Additionally there appears to be direct extension from the hypermetabolic left operator space soft tissue to the sigmoid colon with there is a well-defined hypermetabolic lesion in the colon. 6. More distant hypermetabolic peritoneal metastasis along the ventral peritoneal surface of the RIGHT abdomen beneath RIGHT rectus abdominus muscle. 7. Hypermetabolic lymph nodes in the porta hepatis are favored related to sarcoidosis.   05/19/2020 Cancer Staging   Staging form: Corpus Uteri - Carcinoma and Carcinosarcoma, AJCC 8th Edition - Clinical stage from 05/19/2020: Stage IVB (rcT1, cN2a, cM1) - Signed by [name], MD on  05/19/2020   05/26/2020 Procedure   Successful placement of a right internal jugular approach power injectable Port-A-Cath. The catheter is ready for immediate use   05/29/2020 - 09/27/2020 Chemotherapy   The patient had carboplatin and taxol for chemotherapy treatment.     07/31/2020 Imaging   1. Asymmetric soft tissue mass extending from the left vaginal cuff has decreased in size in the interval. No residual peritoneal nodularity. Pulmonary metastases are stable with exception of a minimally improved left lower lobe nodule. 2. Stable soft tissue nodule associated with the right rectus abdominus muscle. 3. Interval resolution of left hydronephrosis. 4. Calcified mediastinal and hilar lymph nodes, together with pulmonary parenchymal fibrosis, findings which are in keeping with patient's known diagnosis of sarcoid. 5. Aortic atherosclerosis (ICD10-I70.0). Coronary artery calcification.   10/27/2020 Imaging   1. Essentially stable appearance of the soft tissue density nodule posteriorly in the left rectus abdominus muscle, currently 1.8 by 1.2 cm, previously 11 mm. 2. Stable calcified adenopathy in the mediastinum and hila, compatible with prior granulomatous disease. 3. Stable small right lower lobe pulmonary nodules. 4. Stable soft tissue density prominence along the left  vaginal cuff extending up towards the adnexa, probably therapy related. 5. Other imaging findings of potential clinical significance: Coronary atherosclerosis. Small type 1 hiatal hernia. Stable subpleural reticulation favoring the lung bases, possibly from mild fibrosis. 6. Aortic atherosclerosis.   01/19/2021 Imaging   1. Status post hysterectomy. Interval enlargement of soft tissue adjacent to the left aspect of the vaginal cuff and bladder, measuring approximately 4.3 x 2.9 cm in axial section, previously previously previously 3.7 x 1.8 cm when measured similarly. Extensive soft tissue thickening and stranding throughout the  low pelvis. 2. Interval enlargement of a metastatic nodule within the right rectus abdominus. New small peritoneal nodule anterior to the sigmoid colon. 3. Interval enlargement of retroperitoneal retroperitoneal lymph nodes. 4. Multiple small bilateral pulmonary nodules, several of which are new and enlarged. 5. Constellation of findings above is consistent with worsened metastatic disease. 6. New left hydronephrosis and hydroureter, the distal left ureter obstructed by soft tissue near the left aspect of the urinary bladder and vaginal cuff. 7. Unchanged enlarged, partially calcified mediastinal and hilar lymph nodes,, in keeping with prior granulomatous infection or sarcoidosis. 8. Mild underlying pulmonary fibrosis, in a UIP pattern. 9. Coronary artery disease.   02/02/2021 - 07/13/2021 Chemotherapy    Patient is on carboplatin and docetaxel    04/17/2021 Imaging   1. Interval decrease in size of multiple pulmonary nodules, with resolution of some previously seen nodules. 2. Interval decrease in size of enlarged retroperitoneal lymph nodes. 3. Interval decrease in size of soft tissue at the left aspect of the vaginal cuff status post hysterectomy. 4. Interval decrease in size of a soft tissue nodule within the posterior aspect of the right rectus abdominus muscle body. 5. Constellation of findings is consistent with treatment response of locally recurrent, nodal, and distant metastatic disease. 6. There has been interval resolution of previously seen left hydronephrosis and hydroureter. 7. 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   Stable mild partially calcified mediastinal and bilateral hilar lymphadenopathy.   Stable sub-cm abdominal retroperitoneal lymph nodes.   Aortic Atherosclerosis (ICD10-I70.0).     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     02/02/2021 - 07/13/2021 Chemotherapy    Patient is on carboplatin and docetaxel    09/03/2021 -  Chemotherapy    Patient is on Treatment Plan: UTERINE LENVATINIB + PEMBROLIZUMAB Q21D         PHYSICAL EXAMINATION: ECOG PERFORMANCE STATUS: 1 - Symptomatic but completely ambulatory  Vitals:   09/03/21 1345  BP: (!) 144/58  Pulse: 78  Resp: 18  Temp: 99.9 F (37.7 C)  SpO2: 98%  Filed Weights   09/03/21 1345  Weight: 223 lb 6.4 oz (101.3 kg)    GENERAL:alert, no distress and comfortable SKIN: skin color, texture, turgor are normal, no rashes or significant lesions EYES: normal, Conjunctiva are pink and non-injected, sclera clear OROPHARYNX:no exudate, no erythema and lips, buccal mucosa, and tongue normal  NECK: supple, thyroid normal size, non-tender, without nodularity LYMPH:  no palpable lymphadenopathy in the cervical, axillary or inguinal LUNGS: clear to auscultation and percussion with normal breathing effort HEART: regular rate & rhythm and no murmurs and no lower extremity edema ABDOMEN:abdomen soft, non-tender and normal bowel sounds Musculoskeletal:no cyanosis of digits and no clubbing  NEURO: alert & oriented x 3 with fluent speech,  no focal motor/sensory deficits  LABORATORY DATA:  I have reviewed the data as listed    Component Value Date/Time   NA 141 09/03/2021 1319   K 4.2 09/03/2021 1319   CL 105 09/03/2021 1319   CO2 24 09/03/2021 1319   GLUCOSE 171 (H) 09/03/2021 1319   BUN 19 09/03/2021 1319   CREATININE 1.03 (H) 09/03/2021 1319   CALCIUM 9.5 09/03/2021 1319   PROT 8.0 09/03/2021 1319   ALBUMIN 3.7 09/03/2021 1319   AST 18 09/03/2021 1319   ALT 20 09/03/2021 1319   ALKPHOS 112 09/03/2021 1319   BILITOT 0.5 09/03/2021 1319   GFRNONAA >60 09/03/2021 1319   GFRAA >60 09/08/2020 0819    No results found for: SPEP, UPEP  Lab Results  Component Value Date   WBC 6.3 09/03/2021   NEUTROABS 4.4 09/03/2021   HGB 8.8 (L) 09/03/2021   HCT 26.3 (L) 09/03/2021   MCV 102.3 (H) 09/03/2021   PLT 184 09/03/2021      Chemistry      Component Value Date/Time   NA 141 09/03/2021 1319   K 4.2 09/03/2021 1319   CL 105 09/03/2021 1319   CO2 24 09/03/2021 1319   BUN 19 09/03/2021 1319   CREATININE 1.03 (H) 09/03/2021 1319      Component Value Date/Time   CALCIUM 9.5 09/03/2021 1319   ALKPHOS 112 09/03/2021 1319   AST 18 09/03/2021 1319   ALT 20 09/03/2021 1319   BILITOT 0.5 09/03/2021 1319

## 2021-09-03 NOTE — Assessment & Plan Note (Signed)
She has significant persistent pancytopenia despite stopping treatment I plan to check B12 and iron studies in her next visit

## 2021-09-03 NOTE — Telephone Encounter (Addendum)
Oral Chemotherapy Pharmacist Encounter  I spoke with patient in the clinic for overview of: Lenvima (lenvatinib) for the treatment of endometrial cancer in conjunction with pembrolizumab, planned duration until disease progression or unacceptable toxicity.   Counseled patient on administration, dosing, side effects, monitoring, drug-food interactions, safe handling, storage, and disposal.  Patient will take Lenvima 10mg  capsules, 1 capsules (10mg ) by mouth once daily, with or without food, at approximately the same time each day.  Lenvima start date: 09/03/2021  Current medication list in Epic reviewed, DDIs with albuterol and symbicort identified although neither medication prescription has been renewed since 2020 and 2021.  Patient has medication and will monitor her blood pressure throughout especially at the beginning of treatment and will notify the clinic of any high readings.    Adverse effects include but are not limited to: hypertension, hand-foot syndrome, diarrhea, joint pain, fatigue, headache, decreased calcium, proteinuria, increased risk of blood clots, and cardiac conduction issues.   Patient has anti diarrheal and alert the office of 4 or more loose stools above baseline.  Patient instructed to notify office of any upcoming invasive procedures.  Michel Santee will be held for 6 days prior to scheduled surgery, restart based on healing and clinical judgement.   Reviewed with patient importance of keeping a medication schedule and plan for any missed doses. No barriers to medication adherence identified.  Medication reconciliation performed and medication/allergy list updated.  Insurance authorization for Michel Santee has been obtained. Patient receives the medication through [place] at [employer]. I informed patient to reach out to [place] at least one week prior to her last week of medication if the pharmacy does not reach out to her.   All questions answered.  Mrs.  Riviera voiced understanding and appreciation.   Medication education handout placed in mail for patient. Patient knows to call the office with questions or concerns. Oral Chemotherapy Clinic phone number provided to patient.   Drema Halon, PharmD Hematology/Oncology Clinical Pharmacist [facility] [phone]

## 2021-09-03 NOTE — Patient Instructions (Signed)
[facility]  Discharge Instructions: Thank you for choosing [facility] to provide your oncology and hematology care.   If you have a lab appointment with the Cancer Center, please go directly to the Cancer Center and check in at the registration area.   Wear comfortable clothing and clothing appropriate for easy access to any Portacath or PICC line.   We strive to give you quality time with your provider. You may need to reschedule your appointment if you arrive late (15 or more minutes).  Arriving late affects you and other patients whose appointments are after yours.  Also, if you miss three or more appointments without notifying the office, you may be dismissed from the clinic at the provider's discretion.      For prescription refill requests, have your pharmacy contact our office and allow 72 hours for refills to be completed.    Today you received the following chemotherapy and/or immunotherapy agents Keytruda      To help prevent nausea and vomiting after your treatment, we encourage you to take your nausea medication as directed.  BELOW ARE SYMPTOMS THAT SHOULD BE REPORTED IMMEDIATELY: *FEVER GREATER THAN 100.4 F (38 C) OR HIGHER *CHILLS OR SWEATING *NAUSEA AND VOMITING THAT IS NOT CONTROLLED WITH YOUR NAUSEA MEDICATION *UNUSUAL SHORTNESS OF BREATH *UNUSUAL BRUISING OR BLEEDING *URINARY PROBLEMS (pain or burning when urinating, or frequent urination) *BOWEL PROBLEMS (unusual diarrhea, constipation, pain near the anus) TENDERNESS IN MOUTH AND THROAT WITH OR WITHOUT PRESENCE OF ULCERS (sore throat, sores in mouth, or a toothache) UNUSUAL RASH, SWELLING OR PAIN  UNUSUAL VAGINAL DISCHARGE OR ITCHING   Items with * indicate a potential emergency and should be followed up as soon as possible or go to the Emergency Department if any problems should occur.  Please show the CHEMOTHERAPY ALERT CARD or IMMUNOTHERAPY ALERT CARD at check-in to  the Emergency Department and triage nurse.  Should you have questions after your visit or need to cancel or reschedule your appointment, please contact [facility]  Dept: [phone]  and follow the prompts.  Office hours are 8:00 a.m. to 4:30 p.m. Monday - Friday. Please note that voicemails left after 4:00 p.m. may not be returned until the following business day.  We are closed weekends and major holidays. You have access to a nurse at all times for urgent questions. Please call the main number to the clinic Dept: [phone] and follow the prompts.   For any non-urgent questions, you may also contact your provider using MyChart. We now offer e-Visits for anyone 18 and older to request care online for non-urgent symptoms. For details visit [email or web address].   Also download the MyChart app! Go to the app store, search "MyChart", open the app, select [facility], and log in with your MyChart username and password.  Due to Covid, a mask is required upon entering the hospital/clinic. If you do not have a mask, one will be given to you upon arrival. For doctor visits, patients may have 1 support person aged 18 or older with them. For treatment visits, patients cannot have anyone with them due to current Covid guidelines and our immunocompromised population.   Pembrolizumab injection What is this medication? PEMBROLIZUMAB (pem broe liz ue mab) is a monoclonal antibody. It is used totreat certain types of cancer. This medicine may be used for other purposes; ask your health care provider orpharmacist if you have questions. COMMON BRAND NAME(S): Keytruda What should I   tell my care team before I take this medication? They need to know if you have any of these conditions: autoimmune diseases like Crohn's disease, ulcerative colitis, or lupus have had or planning to have an allogeneic stem cell transplant (uses someone else's stem cells) history of organ  transplant history of chest radiation nervous system problems like myasthenia gravis or Guillain-Barre syndrome an unusual or allergic reaction to pembrolizumab, other medicines, foods, dyes, or preservatives pregnant or trying to get pregnant breast-feeding How should I use this medication? This medicine is for infusion into a vein. It is given by a health careprofessional in a hospital or clinic setting. A special MedGuide will be given to you before each treatment. Be sure to readthis information carefully each time. Talk to your pediatrician regarding the use of this medicine in children. While this drug may be prescribed for children as young as 6 months for selectedconditions, precautions do apply. Overdosage: If you think you have taken too much of this medicine contact apoison control center or emergency room at once. NOTE: This medicine is only for you. Do not share this medicine with others. What if I miss a dose? It is important not to miss your dose. Call your doctor or health careprofessional if you are unable to keep an appointment. What may interact with this medication? Interactions have not been studied. This list may not describe all possible interactions. Give your health care provider a list of all the medicines, herbs, non-prescription drugs, or dietary supplements you use. Also tell them if you smoke, drink alcohol, or use illegaldrugs. Some items may interact with your medicine. What should I watch for while using this medication? Your condition will be monitored carefully while you are receiving thismedicine. You may need blood work done while you are taking this medicine. Do not become pregnant while taking this medicine or for 4 months after stopping it. Women should inform their doctor if they wish to become pregnant or think they might be pregnant. There is a potential for serious side effects to an unborn child. Talk to your health care professional or pharmacist for  more information. Do not breast-feed an infant while taking this medicine orfor 4 months after the last dose. What side effects may I notice from receiving this medication? Side effects that you should report to your doctor or health care professionalas soon as possible: allergic reactions like skin rash, itching or hives, swelling of the face, lips, or tongue bloody or black, tarry breathing problems changes in vision chest pain chills confusion constipation cough diarrhea dizziness or feeling faint or lightheaded fast or irregular heartbeat fever flushing joint pain low blood counts - this medicine may decrease the number of white blood cells, red blood cells and platelets. You may be at increased risk for infections and bleeding. muscle pain muscle weakness pain, tingling, numbness in the hands or feet persistent headache redness, blistering, peeling or loosening of the skin, including inside the mouth signs and symptoms of high blood sugar such as dizziness; dry mouth; dry skin; fruity breath; nausea; stomach pain; increased hunger or thirst; increased urination signs and symptoms of kidney injury like trouble passing urine or change in the amount of urine signs and symptoms of liver injury like dark urine, light-colored stools, loss of appetite, nausea, right upper belly pain, yellowing of the eyes or skin sweating swollen lymph nodes weight loss Side effects that usually do not require medical attention (report to yourdoctor or health care professional if they continue   or are bothersome): decreased appetite hair loss tiredness This list may not describe all possible side effects. Call your doctor for medical advice about side effects. You may report side effects to FDA at1-800-FDA-1088. Where should I keep my medication? This drug is given in a hospital or clinic and will not be stored at home. NOTE: This sheet is a summary. It may not cover all possible information. If you  have questions about this medicine, talk to your doctor, pharmacist, orhealth care provider.  2022 Elsevier/Gold Standard (2019-11-03 21:44:53)   

## 2021-09-04 ENCOUNTER — Other Ambulatory Visit (HOSPITAL_COMMUNITY): Payer: Self-pay

## 2021-09-04 LAB — T4: T4, Total: 7.7 ug/dL (ref 4.5–12.0)

## 2021-09-05 ENCOUNTER — Telehealth: Payer: Self-pay

## 2021-09-05 NOTE — Telephone Encounter (Signed)
Received phone call from Patient regarding concern for her elevated blood pressure. Blood pressure is 168/90. She is taking Lenvima by mouth daily and received her first dose of Keytuda on 9/19. She takes Spironolactctone 25mg  PO daily and Bisprolol 10mg  PO daily. Per Dr. Alvy Bimler Patient instructed to hold Lenvima for the next 2 days. This note will be forwarded to Dr. Alvy Bimler and Dr. Alvy Bimler will follow up.

## 2021-09-07 ENCOUNTER — Encounter: Payer: Self-pay | Admitting: Hematology and Oncology

## 2021-09-07 NOTE — Telephone Encounter (Signed)
BP readings given to MD of 124/64; 123/62; and 112/58.  Per MD okay for patient to resume Lenvima.  Pt notified, verbalized understanding and agreement.

## 2021-09-07 NOTE — Telephone Encounter (Signed)
Hi Kathlee Nations,  Can you call her today and ask how she is and get BP readings?

## 2021-09-12 ENCOUNTER — Other Ambulatory Visit (HOSPITAL_COMMUNITY): Payer: Self-pay

## 2021-09-12 DIAGNOSIS — H2000 Unspecified acute and subacute iridocyclitis: Secondary | ICD-10-CM | POA: Diagnosis not present

## 2021-09-12 DIAGNOSIS — H5213 Myopia, bilateral: Secondary | ICD-10-CM | POA: Diagnosis not present

## 2021-09-18 ENCOUNTER — Other Ambulatory Visit (HOSPITAL_COMMUNITY): Payer: Self-pay

## 2021-09-24 ENCOUNTER — Other Ambulatory Visit (HOSPITAL_COMMUNITY): Payer: Self-pay

## 2021-09-25 ENCOUNTER — Inpatient Hospital Stay: Payer: Medicare HMO

## 2021-09-25 ENCOUNTER — Inpatient Hospital Stay (HOSPITAL_BASED_OUTPATIENT_CLINIC_OR_DEPARTMENT_OTHER): Payer: Medicare HMO | Admitting: Hematology and Oncology

## 2021-09-25 ENCOUNTER — Other Ambulatory Visit: Payer: Self-pay

## 2021-09-25 ENCOUNTER — Encounter: Payer: Self-pay | Admitting: Hematology and Oncology

## 2021-09-25 ENCOUNTER — Other Ambulatory Visit (HOSPITAL_COMMUNITY): Payer: Self-pay

## 2021-09-25 ENCOUNTER — Inpatient Hospital Stay: Payer: Medicare HMO | Attending: Hematology and Oncology

## 2021-09-25 DIAGNOSIS — C541 Malignant neoplasm of endometrium: Secondary | ICD-10-CM | POA: Diagnosis not present

## 2021-09-25 DIAGNOSIS — C775 Secondary and unspecified malignant neoplasm of intrapelvic lymph nodes: Secondary | ICD-10-CM

## 2021-09-25 DIAGNOSIS — H9202 Otalgia, left ear: Secondary | ICD-10-CM

## 2021-09-25 DIAGNOSIS — Z5112 Encounter for antineoplastic immunotherapy: Secondary | ICD-10-CM | POA: Insufficient documentation

## 2021-09-25 DIAGNOSIS — C7802 Secondary malignant neoplasm of left lung: Secondary | ICD-10-CM | POA: Diagnosis not present

## 2021-09-25 DIAGNOSIS — E039 Hypothyroidism, unspecified: Secondary | ICD-10-CM | POA: Diagnosis not present

## 2021-09-25 DIAGNOSIS — D539 Nutritional anemia, unspecified: Secondary | ICD-10-CM

## 2021-09-25 DIAGNOSIS — B372 Candidiasis of skin and nail: Secondary | ICD-10-CM

## 2021-09-25 LAB — CMP (CANCER CENTER ONLY)
ALT: 31 U/L (ref 0–44)
AST: 29 U/L (ref 15–41)
Albumin: 3.6 g/dL (ref 3.5–5.0)
Alkaline Phosphatase: 121 U/L (ref 38–126)
Anion gap: 12 (ref 5–15)
BUN: 22 mg/dL (ref 8–23)
CO2: 24 mmol/L (ref 22–32)
Calcium: 9.9 mg/dL (ref 8.9–10.3)
Chloride: 104 mmol/L (ref 98–111)
Creatinine: 1.09 mg/dL — ABNORMAL HIGH (ref 0.44–1.00)
GFR, Estimated: 57 mL/min — ABNORMAL LOW (ref >60)
Glucose, Bld: 122 mg/dL — ABNORMAL HIGH (ref 70–99)
Potassium: 3.9 mmol/L (ref 3.5–5.1)
Sodium: 140 mmol/L (ref 135–145)
Total Bilirubin: 0.6 mg/dL (ref 0.3–1.2)
Total Protein: 8 g/dL (ref 6.5–8.1)

## 2021-09-25 LAB — CBC WITH DIFFERENTIAL (CANCER CENTER ONLY)
Abs Immature Granulocytes: 0.02 10*3/uL (ref 0.00–0.07)
Basophils Absolute: 0 10*3/uL (ref 0.0–0.1)
Basophils Relative: 1 %
Eosinophils Absolute: 0.4 10*3/uL (ref 0.0–0.5)
Eosinophils Relative: 6 %
HCT: 28.4 % — ABNORMAL LOW (ref 36.0–46.0)
Hemoglobin: 10 g/dL — ABNORMAL LOW (ref 12.0–15.0)
Immature Granulocytes: 0 %
Lymphocytes Relative: 20 %
Lymphs Abs: 1.3 10*3/uL (ref 0.7–4.0)
MCH: 34.2 pg — ABNORMAL HIGH (ref 26.0–34.0)
MCHC: 35.2 g/dL (ref 30.0–36.0)
MCV: 97.3 fL (ref 80.0–100.0)
Monocytes Absolute: 0.7 10*3/uL (ref 0.1–1.0)
Monocytes Relative: 11 %
Neutro Abs: 4 10*3/uL (ref 1.7–7.7)
Neutrophils Relative %: 62 %
Platelet Count: 163 10*3/uL (ref 150–400)
RBC: 2.92 MIL/uL — ABNORMAL LOW (ref 3.87–5.11)
RDW: 16.7 % — ABNORMAL HIGH (ref 11.5–15.5)
WBC Count: 6.4 10*3/uL (ref 4.0–10.5)
nRBC: 0 % (ref 0.0–0.2)

## 2021-09-25 LAB — IRON AND TIBC
Iron: 94 ug/dL (ref 41–142)
Saturation Ratios: 26 % (ref 21–57)
TIBC: 364 ug/dL (ref 236–444)
UIBC: 270 ug/dL (ref 120–384)

## 2021-09-25 LAB — FERRITIN: Ferritin: 323 ng/mL — ABNORMAL HIGH (ref 11–307)

## 2021-09-25 LAB — VITAMIN B12: Vitamin B-12: 749 pg/mL (ref 180–914)

## 2021-09-25 LAB — TOTAL PROTEIN, URINE DIPSTICK: Protein, ur: 30 mg/dL — AB

## 2021-09-25 LAB — TSH: TSH: 12.077 u[IU]/mL — ABNORMAL HIGH (ref 0.308–3.960)

## 2021-09-25 MED ORDER — NEOMYCIN-POLYMYXIN-HC 3.5-10000-1 OT SOLN
3.0000 [drp] | Freq: Four times a day (QID) | OTIC | 0 refills | Status: DC
  Filled 2021-09-25: qty 10, 13d supply, fill #0

## 2021-09-25 MED ORDER — SODIUM CHLORIDE 0.9% FLUSH
10.0000 mL | Freq: Once | INTRAVENOUS | Status: AC
  Administered 2021-09-25: 10 mL

## 2021-09-25 MED ORDER — HEPARIN SOD (PORK) LOCK FLUSH 100 UNIT/ML IV SOLN
500.0000 [IU] | Freq: Once | INTRAVENOUS | Status: AC | PRN
  Administered 2021-09-25: 500 [IU]

## 2021-09-25 MED ORDER — SODIUM CHLORIDE 0.9 % IV SOLN
200.0000 mg | Freq: Once | INTRAVENOUS | Status: AC
  Administered 2021-09-25: 200 mg via INTRAVENOUS
  Filled 2021-09-25: qty 8

## 2021-09-25 MED ORDER — CIPRO HC 0.2-1 % OT SUSP
3.0000 [drp] | Freq: Two times a day (BID) | OTIC | 0 refills | Status: DC
  Filled 2021-09-25: qty 10, 26d supply, fill #0

## 2021-09-25 MED ORDER — SODIUM CHLORIDE 0.9% FLUSH
10.0000 mL | INTRAVENOUS | Status: DC | PRN
  Administered 2021-09-25: 10 mL

## 2021-09-25 MED ORDER — LIDOCAINE-PRILOCAINE 2.5-2.5 % EX CREA
1.0000 "application " | TOPICAL_CREAM | CUTANEOUS | 3 refills | Status: AC | PRN
  Filled 2021-09-25: qty 30, 30d supply, fill #0

## 2021-09-25 MED ORDER — LEVOTHYROXINE SODIUM 50 MCG PO TABS
50.0000 ug | ORAL_TABLET | Freq: Every day | ORAL | 3 refills | Status: DC
  Filled 2021-09-25: qty 30, 30d supply, fill #0
  Filled 2021-11-02: qty 30, 30d supply, fill #1

## 2021-09-25 MED ORDER — HEPARIN SOD (PORK) LOCK FLUSH 100 UNIT/ML IV SOLN: 500.0000 [IU] | Freq: Once | INTRAVENOUS | Status: DC

## 2021-09-25 MED ORDER — SODIUM CHLORIDE 0.9 % IV SOLN: Freq: Once | INTRAVENOUS | Status: AC

## 2021-09-25 MED ORDER — NYSTATIN 100000 UNIT/GM EX POWD
1.0000 "application " | Freq: Two times a day (BID) | CUTANEOUS | 0 refills | Status: DC
  Filled 2021-09-25: qty 60, 15d supply, fill #0

## 2021-09-25 NOTE — Assessment & Plan Note (Signed)
She has signs of yeast infection under the skin fold of the breast I recommend keeping her skin dry I will start her on nystatin powder

## 2021-09-25 NOTE — Progress Notes (Signed)
Left patient accessed for infusion tomorrow.

## 2021-09-25 NOTE — Assessment & Plan Note (Signed)
She is tender to palpation in the anterior tragus area The exterior auditory canal appears normal but there is some opacity changes on her tympanic membrane I recommend a short course of topical eardrop If it does not improve over the next few days, she needs to see her primary care doctor for management

## 2021-09-25 NOTE — Assessment & Plan Note (Signed)
Overall, she tolerated pembrolizumab with Lenvima well except for multiple expected side effects such as acquired hypothyroidism, elevated blood pressure and others We will continue treatment for minimum 3 months of treatment before repeating imaging study

## 2021-09-25 NOTE — Assessment & Plan Note (Signed)
This is a well-known side effects from treatment We will start her on synthroid and adjust the dose of treatment accordingly

## 2021-09-25 NOTE — Patient Instructions (Signed)
[facility]  Discharge Instructions: ?Thank you for choosing [facility] to provide your oncology and hematology care.  ? ?If you have a lab appointment with the Cancer Center, please go directly to the Cancer Center and check in at the registration area. ?  ?Wear comfortable clothing and clothing appropriate for easy access to any Portacath or PICC line.  ? ?We strive to give you quality time with your provider. You may need to reschedule your appointment if you arrive late (15 or more minutes).  Arriving late affects you and other patients whose appointments are after yours.  Also, if you miss three or more appointments without notifying the office, you may be dismissed from the clinic at the provider?s discretion.    ?  ?For prescription refill requests, have your pharmacy contact our office and allow 72 hours for refills to be completed.   ? ?Today you received the following chemotherapy and/or immunotherapy agents: Keytruda ?  ?To help prevent nausea and vomiting after your treatment, we encourage you to take your nausea medication as directed. ? ?BELOW ARE SYMPTOMS THAT SHOULD BE REPORTED IMMEDIATELY: ?*FEVER GREATER THAN 100.4 F (38 ?C) OR HIGHER ?*CHILLS OR SWEATING ?*NAUSEA AND VOMITING THAT IS NOT CONTROLLED WITH YOUR NAUSEA MEDICATION ?*UNUSUAL SHORTNESS OF BREATH ?*UNUSUAL BRUISING OR BLEEDING ?*URINARY PROBLEMS (pain or burning when urinating, or frequent urination) ?*BOWEL PROBLEMS (unusual diarrhea, constipation, pain near the anus) ?TENDERNESS IN MOUTH AND THROAT WITH OR WITHOUT PRESENCE OF ULCERS (sore throat, sores in mouth, or a toothache) ?UNUSUAL RASH, SWELLING OR PAIN  ?UNUSUAL VAGINAL DISCHARGE OR ITCHING  ? ?Items with * indicate a potential emergency and should be followed up as soon as possible or go to the Emergency Department if any problems should occur. ? ?Please show the CHEMOTHERAPY ALERT CARD or IMMUNOTHERAPY ALERT CARD at check-in to the  Emergency Department and triage nurse. ? ?Should you have questions after your visit or need to cancel or reschedule your appointment, please contact [facility]  Dept: [phone]  and follow the prompts.  Office hours are 8:00 a.m. to 4:30 p.m. Monday - Friday. Please note that voicemails left after 4:00 p.m. may not be returned until the following business day.  We are closed weekends and major holidays. You have access to a nurse at all times for urgent questions. Please call the main number to the clinic Dept: [phone] and follow the prompts. ? ? ?For any non-urgent questions, you may also contact your provider using MyChart. We now offer e-Visits for anyone 18 and older to request care online for non-urgent symptoms. For details visit [email or web address]. ?  ?Also download the MyChart app! Go to the app store, search "MyChart", open the app, select [facility], and log in with your MyChart username and password. ? ?Due to Covid, a mask is required upon entering the hospital/clinic. If you do not have a mask, one will be given to you upon arrival. For doctor visits, patients may have 1 support person aged 18 or older with them. For treatment visits, patients cannot have anyone with them due to current Covid guidelines and our immunocompromised population.  ? ?

## 2021-09-25 NOTE — Progress Notes (Signed)
[place] OFFICE PROGRESS NOTE  Patient Care Team: [name], [name], MD as PCP - General (Family Medicine)  ASSESSMENT & PLAN:  Endometrial cancer ([name]) Overall, she tolerated pembrolizumab with Lenvima well except for multiple expected side effects such as acquired hypothyroidism, elevated blood pressure and others We will continue treatment for minimum 3 months of treatment before repeating imaging study  Left ear pain She is tender to palpation in the anterior tragus area The exterior auditory canal appears normal but there is some opacity changes on her tympanic membrane I recommend a short course of topical eardrop If it does not improve over the next few days, she needs to see her primary care doctor for management  Acquired hypothyroidism This is a well-known side effects from treatment We will start her on synthroid and adjust the dose of treatment accordingly  Yeast infection of the skin She has signs of yeast infection under the skin fold of the breast I recommend keeping her skin dry I will start her on nystatin powder  No orders of the defined types were placed in this encounter.   All questions were answered. The patient knows to call the clinic with any problems, questions or concerns. The total time spent in the appointment was 40 minutes encounter with patients including review of chart and various tests results, discussions about plan of care and coordination of care plan   [name], MD 09/25/2021 4:55 PM  INTERVAL HISTORY: Please see below for problem oriented charting. she returns for treatment follow-up on pembrolizumab and Lenvima She did not bring her blood pressure documentation from home but recall they were within normal range She has bilateral lower extremity edema She noticed some skin rashes under her breasts She complains of pain on the left side of the year Her appetite is fair Denies recent cough, chest pain or shortness of  breath No recent changes in her bowel habits  REVIEW OF SYSTEMS:   Constitutional: Denies fevers, chills or abnormal weight loss Eyes: Denies blurriness of vision Ears, nose, mouth, throat, and face: Denies mucositis or sore throat Respiratory: Denies cough, dyspnea or wheezes Cardiovascular: Denies palpitation, chest discomfort  Gastrointestinal:  Denies nausea, heartburn or change in bowel habits Lymphatics: Denies new lymphadenopathy or easy bruising Neurological:Denies numbness, tingling or new weaknesses Behavioral/Psych: Mood is stable, no new changes  All other systems were reviewed with the patient and are negative.  I have reviewed the past medical history, past surgical history, social history and family history with the patient and they are unchanged from previous note.  ALLERGIES:  is allergic to shellfish allergy and penicillins.  MEDICATIONS:  Current Outpatient Medications  Medication Sig Dispense Refill   levothyroxine (SYNTHROID) 50 MCG tablet Take 1 tablet (50 mcg total) by mouth daily before breakfast. 30 tablet 3   lidocaine-prilocaine (EMLA) cream Apply 1 application topically as needed. 30 g 3   neomycin-polymyxin-hydrocortisone (CORTISPORIN) OTIC solution Place 3 drops into the left ear 4 (four) times daily. 10 mL 0   nystatin (MYCOSTATIN/NYSTOP) powder Apply to affected area 2 (two) times daily. 60 g 0   albuterol (VENTOLIN HFA) 108 (90 Base) MCG/ACT inhaler INHALE 2 PUFFS INTO THE LUNGS EVERY 6 HOURS AS NEEDED FOR WHEEZING OR SHORTNESS OF BREATH (NEED MD APPOINTMENT) 3 each 0   bisoprolol (ZEBETA) 10 MG tablet Take 1 tablet (10 mg total) by mouth in the morning and at bedtime. 180 tablet 0   budesonide-formoterol (SYMBICORT) 80-4.5 MCG/ACT inhaler Inhale 2 puffs  into the lungs 2 (two) times daily. 3 Inhaler 3   desonide (DESOWEN) 0.05 % lotion Apply topically 2 (two) times daily. Continue following with dermatologist 59 mL 0   dextromethorphan (DELSYM) 30 MG/5ML  liquid Take by mouth as needed for cough.      hydrochlorothiazide (HYDRODIURIL) 25 MG tablet Take 25 mg by mouth daily.     lenvatinib 20 mg daily dose (LENVIMA) 2 x 10 MG capsule Take 1 capsule (10 mg total) by mouth daily. 30 capsule 11   loratadine (CLARITIN) 10 MG tablet Take 10 mg by mouth daily as needed for allergies.     ondansetron (ZOFRAN) 8 MG tablet Take 1 tablet (8 mg total) by mouth every 8 (eight) hours as needed for nausea. 30 tablet 3   pantoprazole (PROTONIX) 40 MG tablet Take 1 tablet (40 mg total) by mouth daily. Patient needs appt for further refills. 90 tablet 0   polyethylene glycol (MIRALAX / GLYCOLAX) 17 g packet Take 17 g by mouth daily.     prochlorperazine (COMPAZINE) 10 MG tablet TAKE 1 TABLET BY MOUTH EVERY 6 HOURS AS NEEDED FOR NAUSEA OR VOMITING 60 tablet 3   RABEprazole (ACIPHEX) 20 MG tablet Take 1 tablet (20 mg total) by mouth daily before breakfast. 90 tablet 3   spironolactone (ALDACTONE) 25 MG tablet Take 1 tablet by mouth daily. 30 tablet 1   vitamin C (ASCORBIC ACID) 500 MG tablet Take 500 mg by mouth daily.     No current facility-administered medications for this visit.   Facility-Administered Medications Ordered in Other Visits  Medication Dose Route Frequency Provider Last Rate Last Admin   sodium chloride flush (NS) 0.9 % injection 10 mL  10 mL Intracatheter PRN Alvy Bimler, Bina Veenstra, MD   10 mL at 09/25/21 1357    SUMMARY OF ONCOLOGIC HISTORY: Oncology History Overview Note  ER/PR neg, MSI stable   Endometrial cancer ([place])  06/15/2018 Surgery   Surgeon: Donaciano Eva     Operation: Robotic-assisted laparoscopic total hysterectomy with bilateral salpingoophorectomy, SLN biopsy     Operative Findings:  : 6cm uterus, normal tubes and ovaries, omentum adherent to the anterior abdominal wall and uterine fundus. No suspicious nodes.   06/15/2018 Pathology Results   Procedure: Total hysterectomy with bilateral salpingo-oophorectomy. Bilateral obturator  sentinel lymph node biopsies. Histologic type: Endometrioid adenocarcinoma. Histologic Grade: FIGO grade 1. Myometrial invasion: Depth of invasion: 15 mm Myometrial thickness: 20 mm Uterine Serosa Involvement: Not identified. Cervical stromal involvement: Not identified. Extent of involvement of other organs: Uninvolved. Lymphovascular invasion: Present. Regional Lymph Nodes: Examined: 2 Sentinel 0 Non-sentinel 2 Total Lymph nodes with metastasis: 0 Isolated tumor cells (< 0.2 mm): 2 Micrometastasis: (> 0.2 mm and < 2.0 mm): 0 Macrometastasis: (> 2.0 mm): 0 Extracapsular extension: N/A. Tumor block for ancillary studies: 4Q-S. MMR / MSI testing: Will be ordered. Pathologic Stage Classification (pTNM, AJCC 8th edition): pT1b, pN0(i+)   07/20/2018 Imaging   1. Surgical changes from recent hysterectomy. No findings suspicious for residual tumor or adenopathy in the pelvis. 2. No CT findings to suggest omental, peritoneal surface or solid organ metastatic disease in the abdomen. 3. Changes consistent with known sarcoidosis in the chest with bulky calcified mediastinal and hilar lymph nodes and interstitial lung disease. I do not see any definite CT findings to suggest metastatic disease involving the chest.   08/19/2018 - 09/17/2018 Radiation Therapy   08/19/18 - 09/17/18: Vaginal Cuff, 3 cm cylinder with treatment length of 3 cm / 30  Gy delivered in 5 fractions of 6 Gy, Brachytherapy / HDR, Iridium-192   05/2005: Left Chest Wall keloid   04/2004 - 05/2004: Anus, pelvis /inguinal ( Dr. Tammi Klippel)   01/19/2020 Imaging   1. No definite CT findings of the abdomen or pelvis to explain pain. 2. The sigmoid colon and rectum are decompressed although slightly thickened appearing, suggestive of nonspecific infectious, inflammatory, or ischemic colitis. Correlate for referable clinical symptoms. 3. There is trace, nonspecific free fluid in the low pelvis, which may be reactive, although would be difficult  to distinguish from postoperative and post treatment soft tissue change in the pelvis given history of hysterectomy and rectal cancer. 4. Unchanged prominent retroperitoneal and iliac lymph nodes. No new findings suspicious for metastatic disease in the abdomen or pelvis. 5. Bibasilar pulmonary fibrosis and mediastinal and hilar lymphadenopathy, better evaluated by prior CT chest and consistent with patient diagnosis of sarcoidosis.       04/18/2020 Imaging   Venous Doppler Left:  - No evidence of deep vein thrombosis seen in the left lower extremity, from the common femoral through the popliteal veins.  - No evidence of superficial venous thrombosis in the left lower extremity.  - There is no evidence of venous reflux seen in the left lower extremity.  - Rouleax flow noted in the superficial system.    05/18/2020 PET scan   1. Complex scan with a combination of hypermetabolic granulomatous disease (sarcoidosis) as well as hypermetabolic metastatic endometrial carcinoma. 2. Hypermetabolic mediastinal hilar lymph nodes and pulmonary nodularity with bronchovascular thickening is consistent with sarcoidosis. 3. Solitary hypermetabolic nodule in the medial LEFT lower lobe, new from prior with intense metabolic activity, is concerning for a metastatic deposit to the LEFT lower lobe. 4. Ill-defined hypermetabolic soft tissue along the LEFT operator space is most consistent with local recurrence of endometrial carcinoma. There is entrapment of the LEFT ureter by this hypermetabolic soft tissue with mild hydroureter and hydronephrosis on the LEFT. 5. Additionally there appears to be direct extension from the hypermetabolic left operator space soft tissue to the sigmoid colon with there is a well-defined hypermetabolic lesion in the colon. 6. More distant hypermetabolic peritoneal metastasis along the ventral peritoneal surface of the RIGHT abdomen beneath RIGHT rectus abdominus muscle. 7. Hypermetabolic  lymph nodes in the porta hepatis are favored related to sarcoidosis.   05/19/2020 Cancer Staging   Staging form: Corpus Uteri - Carcinoma and Carcinosarcoma, AJCC 8th Edition - Clinical stage from 05/19/2020: Stage IVB (rcT1, cN2a, cM1) - Signed by [name], MD on 05/19/2020   05/26/2020 Procedure   Successful placement of a right internal jugular approach power injectable Port-A-Cath. The catheter is ready for immediate use   05/29/2020 - 09/27/2020 Chemotherapy   The patient had carboplatin and taxol for chemotherapy treatment.     07/31/2020 Imaging   1. Asymmetric soft tissue mass extending from the left vaginal cuff has decreased in size in the interval. No residual peritoneal nodularity. Pulmonary metastases are stable with exception of a minimally improved left lower lobe nodule. 2. Stable soft tissue nodule associated with the right rectus abdominus muscle. 3. Interval resolution of left hydronephrosis. 4. Calcified mediastinal and hilar lymph nodes, together with pulmonary parenchymal fibrosis, findings which are in keeping with patient's known diagnosis of sarcoid. 5. Aortic atherosclerosis (ICD10-I70.0). Coronary artery calcification.   10/27/2020 Imaging   1. Essentially stable appearance of the soft tissue density nodule posteriorly in the left rectus abdominus muscle, currently 1.8 by 1.2 cm, previously 11  mm. 2. Stable calcified adenopathy in the mediastinum and hila, compatible with prior granulomatous disease. 3. Stable small right lower lobe pulmonary nodules. 4. Stable soft tissue density prominence along the left vaginal cuff extending up towards the adnexa, probably therapy related. 5. Other imaging findings of potential clinical significance: Coronary atherosclerosis. Small type 1 hiatal hernia. Stable subpleural reticulation favoring the lung bases, possibly from mild fibrosis. 6. Aortic atherosclerosis.   01/19/2021 Imaging   1. Status post hysterectomy. Interval  enlargement of soft tissue adjacent to the left aspect of the vaginal cuff and bladder, measuring approximately 4.3 x 2.9 cm in axial section, previously previously previously 3.7 x 1.8 cm when measured similarly. Extensive soft tissue thickening and stranding throughout the low pelvis. 2. Interval enlargement of a metastatic nodule within the right rectus abdominus. New small peritoneal nodule anterior to the sigmoid colon. 3. Interval enlargement of retroperitoneal retroperitoneal lymph nodes. 4. Multiple small bilateral pulmonary nodules, several of which are new and enlarged. 5. Constellation of findings above is consistent with worsened metastatic disease. 6. New left hydronephrosis and hydroureter, the distal left ureter obstructed by soft tissue near the left aspect of the urinary bladder and vaginal cuff. 7. Unchanged enlarged, partially calcified mediastinal and hilar lymph nodes,, in keeping with prior granulomatous infection or sarcoidosis. 8. Mild underlying pulmonary fibrosis, in a UIP pattern. 9. Coronary artery disease.   02/02/2021 - 07/13/2021 Chemotherapy    Patient is on carboplatin and docetaxel    04/17/2021 Imaging   1. Interval decrease in size of multiple pulmonary nodules, with resolution of some previously seen nodules. 2. Interval decrease in size of enlarged retroperitoneal lymph nodes. 3. Interval decrease in size of soft tissue at the left aspect of the vaginal cuff status post hysterectomy. 4. Interval decrease in size of a soft tissue nodule within the posterior aspect of the right rectus abdominus muscle body. 5. Constellation of findings is consistent with treatment response of locally recurrent, nodal, and distant metastatic disease. 6. There has been interval resolution of previously seen left hydronephrosis and hydroureter. 7. Redemonstrated mild pulmonary fibrosis in a UIP pattern   07/26/2021 Imaging   No new or progressive disease within the chest, abdomen,  or pelvis.   Stable mild partially calcified mediastinal and bilateral hilar lymphadenopathy.   Stable sub-cm abdominal retroperitoneal lymph nodes.   Aortic Atherosclerosis (ICD10-I70.0).     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     02/02/2021 - 07/13/2021 Chemotherapy    Patient is on carboplatin and docetaxel    09/03/2021 -  Chemotherapy   Patient is on Treatment Plan : UTERINE Lenvatinib + Pembrolizumab q21d       PHYSICAL EXAMINATION: ECOG PERFORMANCE STATUS: 1 - Symptomatic but completely  ambulatory  Vitals:   09/25/21 1127  BP: (!) 180/86  Pulse: 64  Resp: 18  Temp: 98.1 F (36.7 C)  SpO2: 99%   Filed Weights   09/25/21 1127  Weight: 220 lb 3.2 oz (99.9 kg)    GENERAL:alert, no distress and comfortable SKIN: She has yeast infection on her skin under the skin fold on her breast EYES: normal, Conjunctiva are pink and non-injected, sclera clear OROPHARYNX:no exudate, no erythema and lips, buccal mucosa, and tongue normal  NECK: supple, thyroid normal size, non-tender, without nodularity LYMPH:  no palpable lymphadenopathy in the cervical, axillary or inguinal LUNGS: clear to auscultation and percussion with normal  breathing effort HEART: regular rate & rhythm and no murmurs with moderate bilateral lower extremity edema ABDOMEN:abdomen soft, non-tender and normal bowel sounds Musculoskeletal:no cyanosis of digits and no clubbing  NEURO: alert & oriented x 3 with fluent speech, no focal motor/sensory deficits  LABORATORY DATA:  I have reviewed the data as listed    Component Value Date/Time   NA 140 09/25/2021 1052   K 3.9 09/25/2021 1052   CL 104 09/25/2021 1052   CO2 24 09/25/2021 1052   GLUCOSE 122 (H) 09/25/2021 1052   BUN 22 09/25/2021 1052   CREATININE 1.09 (H) 09/25/2021 1052   CALCIUM 9.9 09/25/2021 1052   PROT 8.0 09/25/2021 1052   ALBUMIN 3.6 09/25/2021 1052   AST 29 09/25/2021 1052   ALT 31 09/25/2021 1052   ALKPHOS 121 09/25/2021 1052   BILITOT 0.6 09/25/2021 1052   GFRNONAA 57 (L) 09/25/2021 1052   GFRAA >60 09/08/2020 0819    No results found for: SPEP, UPEP  Lab Results  Component Value Date   WBC 6.4 09/25/2021   NEUTROABS 4.0 09/25/2021   HGB 10.0 (L) 09/25/2021   HCT 28.4 (L) 09/25/2021   MCV 97.3 09/25/2021   PLT 163 09/25/2021      Chemistry      Component Value Date/Time   NA 140 09/25/2021 1052   K 3.9 09/25/2021 1052   CL 104 09/25/2021 1052   CO2 24 09/25/2021 1052   BUN 22 09/25/2021 1052   CREATININE 1.09 (H) 09/25/2021 1052      Component Value Date/Time   CALCIUM 9.9 09/25/2021 1052   ALKPHOS 121 09/25/2021 1052   AST 29 09/25/2021 1052   ALT 31 09/25/2021 1052   BILITOT 0.6 09/25/2021 1052

## 2021-09-26 ENCOUNTER — Ambulatory Visit: Payer: Medicare HMO

## 2021-09-26 LAB — T4: T4, Total: 6.4 ug/dL (ref 4.5–12.0)

## 2021-09-27 DIAGNOSIS — H2013 Chronic iridocyclitis, bilateral: Secondary | ICD-10-CM | POA: Diagnosis not present

## 2021-10-01 ENCOUNTER — Other Ambulatory Visit (HOSPITAL_COMMUNITY): Payer: Self-pay

## 2021-10-02 ENCOUNTER — Other Ambulatory Visit (HOSPITAL_COMMUNITY): Payer: Self-pay

## 2021-10-03 ENCOUNTER — Other Ambulatory Visit (HOSPITAL_COMMUNITY): Payer: Self-pay

## 2021-10-05 ENCOUNTER — Other Ambulatory Visit (HOSPITAL_COMMUNITY): Payer: Self-pay

## 2021-10-09 ENCOUNTER — Ambulatory Visit: Payer: Medicare HMO

## 2021-10-10 ENCOUNTER — Telehealth: Payer: Self-pay

## 2021-10-10 NOTE — Telephone Encounter (Signed)
She has yeast infection under skin fold, I have advised her to keep those areas dry The hand and face is probably due to mild dermatatis Benadryl is ok

## 2021-10-10 NOTE — Telephone Encounter (Signed)
Called back and given below message. She verbalized understanding and will call the office back if needed.

## 2021-10-10 NOTE — Telephone Encounter (Signed)
Returned her call. She is complaining of itching/ rash under breasts, hands and face. Face rash better today after taking benadryl last night. She has been using the nystatin powder and it is not helping. Asking if she should continue to the benadryl. Instructed to continue the benadryl prn. She verbalized understanding.  Any other instructions?

## 2021-10-16 ENCOUNTER — Inpatient Hospital Stay: Payer: Medicare HMO | Attending: Hematology and Oncology

## 2021-10-16 ENCOUNTER — Inpatient Hospital Stay (HOSPITAL_BASED_OUTPATIENT_CLINIC_OR_DEPARTMENT_OTHER): Payer: Medicare HMO | Admitting: Hematology and Oncology

## 2021-10-16 ENCOUNTER — Other Ambulatory Visit: Payer: Self-pay

## 2021-10-16 ENCOUNTER — Encounter: Payer: Self-pay | Admitting: Hematology and Oncology

## 2021-10-16 ENCOUNTER — Inpatient Hospital Stay: Payer: Medicare HMO

## 2021-10-16 DIAGNOSIS — C541 Malignant neoplasm of endometrium: Secondary | ICD-10-CM | POA: Insufficient documentation

## 2021-10-16 DIAGNOSIS — Z5112 Encounter for antineoplastic immunotherapy: Secondary | ICD-10-CM | POA: Insufficient documentation

## 2021-10-16 DIAGNOSIS — E860 Dehydration: Secondary | ICD-10-CM | POA: Insufficient documentation

## 2021-10-16 DIAGNOSIS — E039 Hypothyroidism, unspecified: Secondary | ICD-10-CM | POA: Diagnosis not present

## 2021-10-16 DIAGNOSIS — R197 Diarrhea, unspecified: Secondary | ICD-10-CM | POA: Diagnosis not present

## 2021-10-16 DIAGNOSIS — R509 Fever, unspecified: Secondary | ICD-10-CM | POA: Insufficient documentation

## 2021-10-16 DIAGNOSIS — Z79899 Other long term (current) drug therapy: Secondary | ICD-10-CM | POA: Diagnosis not present

## 2021-10-16 DIAGNOSIS — C775 Secondary and unspecified malignant neoplasm of intrapelvic lymph nodes: Secondary | ICD-10-CM

## 2021-10-16 DIAGNOSIS — B372 Candidiasis of skin and nail: Secondary | ICD-10-CM | POA: Diagnosis not present

## 2021-10-16 DIAGNOSIS — C7802 Secondary malignant neoplasm of left lung: Secondary | ICD-10-CM

## 2021-10-16 LAB — CBC WITH DIFFERENTIAL (CANCER CENTER ONLY)
Abs Immature Granulocytes: 0.02 10*3/uL (ref 0.00–0.07)
Basophils Absolute: 0 10*3/uL (ref 0.0–0.1)
Basophils Relative: 0 %
Eosinophils Absolute: 0.3 10*3/uL (ref 0.0–0.5)
Eosinophils Relative: 5 %
HCT: 31.1 % — ABNORMAL LOW (ref 36.0–46.0)
Hemoglobin: 10.9 g/dL — ABNORMAL LOW (ref 12.0–15.0)
Immature Granulocytes: 0 %
Lymphocytes Relative: 21 %
Lymphs Abs: 1.3 10*3/uL (ref 0.7–4.0)
MCH: 33.5 pg (ref 26.0–34.0)
MCHC: 35 g/dL (ref 30.0–36.0)
MCV: 95.7 fL (ref 80.0–100.0)
Monocytes Absolute: 0.7 10*3/uL (ref 0.1–1.0)
Monocytes Relative: 12 %
Neutro Abs: 3.9 10*3/uL (ref 1.7–7.7)
Neutrophils Relative %: 62 %
Platelet Count: 167 10*3/uL (ref 150–400)
RBC: 3.25 MIL/uL — ABNORMAL LOW (ref 3.87–5.11)
RDW: 16.2 % — ABNORMAL HIGH (ref 11.5–15.5)
WBC Count: 6.3 10*3/uL (ref 4.0–10.5)
nRBC: 0 % (ref 0.0–0.2)

## 2021-10-16 LAB — CMP (CANCER CENTER ONLY)
ALT: 33 U/L (ref 0–44)
AST: 27 U/L (ref 15–41)
Albumin: 3.5 g/dL (ref 3.5–5.0)
Alkaline Phosphatase: 114 U/L (ref 38–126)
Anion gap: 11 (ref 5–15)
BUN: 18 mg/dL (ref 8–23)
CO2: 24 mmol/L (ref 22–32)
Calcium: 9 mg/dL (ref 8.9–10.3)
Chloride: 105 mmol/L (ref 98–111)
Creatinine: 0.96 mg/dL (ref 0.44–1.00)
GFR, Estimated: >60 mL/min (ref >60)
Glucose, Bld: 122 mg/dL — ABNORMAL HIGH (ref 70–99)
Potassium: 3.9 mmol/L (ref 3.5–5.1)
Sodium: 140 mmol/L (ref 135–145)
Total Bilirubin: 0.6 mg/dL (ref 0.3–1.2)
Total Protein: 7.9 g/dL (ref 6.5–8.1)

## 2021-10-16 LAB — TSH: TSH: 10.3 u[IU]/mL — ABNORMAL HIGH (ref 0.308–3.960)

## 2021-10-16 LAB — TOTAL PROTEIN, URINE DIPSTICK: Protein, ur: 30 mg/dL — AB

## 2021-10-16 MED ORDER — SODIUM CHLORIDE 0.9 % IV SOLN
200.0000 mg | Freq: Once | INTRAVENOUS | Status: AC
  Administered 2021-10-16: 200 mg via INTRAVENOUS
  Filled 2021-10-16: qty 8

## 2021-10-16 MED ORDER — HEPARIN SOD (PORK) LOCK FLUSH 100 UNIT/ML IV SOLN
500.0000 [IU] | Freq: Once | INTRAVENOUS | Status: AC | PRN
  Administered 2021-10-16: 500 [IU]

## 2021-10-16 MED ORDER — SODIUM CHLORIDE 0.9 % IV SOLN: Freq: Once | INTRAVENOUS | Status: AC

## 2021-10-16 MED ORDER — SODIUM CHLORIDE 0.9% FLUSH
10.0000 mL | Freq: Once | INTRAVENOUS | Status: AC
  Administered 2021-10-16: 10 mL

## 2021-10-16 MED ORDER — SODIUM CHLORIDE 0.9% FLUSH
10.0000 mL | INTRAVENOUS | Status: DC | PRN
  Administered 2021-10-16: 10 mL

## 2021-10-16 MED ORDER — SODIUM CHLORIDE 0.9 % IV SOLN: Freq: Once | INTRAVENOUS | Status: DC

## 2021-10-16 MED ORDER — ONDANSETRON HCL 4 MG/2ML IJ SOLN
8.0000 mg | Freq: Once | INTRAMUSCULAR | Status: AC
  Administered 2021-10-16: 8 mg via INTRAVENOUS
  Filled 2021-10-16: qty 4

## 2021-10-16 NOTE — Assessment & Plan Note (Signed)
The cause of the diarrhea is not from her treatment I recommend Imodium as needed and to avoid dairy and to drink plenty of fluids

## 2021-10-16 NOTE — Assessment & Plan Note (Signed)
She was recently started on Synthroid TSH is improving Observe for now

## 2021-10-16 NOTE — Assessment & Plan Note (Signed)
She has signs of yeast infection under the skin fold of the breast I recommend keeping her skin dry She will continue to use nystatin powder The skin rash on her back is not due to this

## 2021-10-16 NOTE — Assessment & Plan Note (Signed)
She is at risk of severe dehydration with her recent diarrhea and lost some weight I recommend discontinuation of hydrochlorothiazide and spironolactone until she improves/recovers from diarrhea

## 2021-10-16 NOTE — Patient Instructions (Signed)
[facility]  Discharge Instructions: Thank you for choosing [facility] to provide your oncology and hematology care.   If you have a lab appointment with the [facility], please go directly to the [facility] and check in at the registration area.   Wear comfortable clothing and clothing appropriate for easy access to any Portacath or PICC line.   We strive to give you quality time with your provider. You may need to reschedule your appointment if you arrive late (15 or more minutes).  Arriving late affects you and other patients whose appointments are after yours.  Also, if you miss three or more appointments without notifying the office, you may be dismissed from the clinic at the provider's discretion.      For prescription refill requests, have your pharmacy contact our office and allow 72 hours for refills to be completed.    Today you received the following chemotherapy and/or immunotherapy agent: Pembrolizumab (Keytruda).   To help prevent nausea and vomiting after your treatment, we encourage you to take your nausea medication as directed.  BELOW ARE SYMPTOMS THAT SHOULD BE REPORTED IMMEDIATELY: *FEVER GREATER THAN 100.4 F (38 C) OR HIGHER *CHILLS OR SWEATING *NAUSEA AND VOMITING THAT IS NOT CONTROLLED WITH YOUR NAUSEA MEDICATION *UNUSUAL SHORTNESS OF BREATH *UNUSUAL BRUISING OR BLEEDING *URINARY PROBLEMS (pain or burning when urinating, or frequent urination) *BOWEL PROBLEMS (unusual diarrhea, constipation, pain near the anus) TENDERNESS IN MOUTH AND THROAT WITH OR WITHOUT PRESENCE OF ULCERS (sore throat, sores in mouth, or a toothache) UNUSUAL RASH, SWELLING OR PAIN  UNUSUAL VAGINAL DISCHARGE OR ITCHING   Items with * indicate a potential emergency and should be followed up as soon as possible or go to the Emergency Department if any problems should occur.  Please show the CHEMOTHERAPY ALERT CARD or IMMUNOTHERAPY ALERT CARD at  check-in to the Emergency Department and triage nurse.  Should you have questions after your visit or need to cancel or reschedule your appointment, please contact [facility]  Dept: [phone]  and follow the prompts.  Office hours are 8:00 a.m. to 4:30 p.m. Monday - Friday. Please note that voicemails left after 4:00 p.m. may not be returned until the following business day.  We are closed weekends and major holidays. You have access to a nurse at all times for urgent questions. Please call the main number to the clinic Dept: [phone] and follow the prompts.   For any non-urgent questions, you may also contact your provider using MyChart. We now offer e-Visits for anyone 40 and older to request care online for non-urgent symptoms. For details visit [email or web address].   Rehydration, Adult Rehydration is the replacement of body fluids, salts, and minerals (electrolytes) that are lost during dehydration. Dehydration is when there is not enough water or other fluids in the body. This happens when you lose more fluids than you take in. Common causes of dehydration include: Not drinking enough fluids. This can occur when you are ill or doing activities that require a lot of energy, especially in hot weather. Conditions that cause loss of water or other fluids, such as diarrhea, vomiting, sweating, or urinating a lot. Other illnesses, such as fever or infection. Certain medicines, such as those that remove excess fluid from the body (diuretics). Symptoms of mild or moderate dehydration may include thirst, dry lips and mouth, and dizziness. Symptoms of severe dehydration may include increased heart rate, confusion, fainting, and not urinating. For  severe dehydration, you may need to get fluids through an IV at the hospital. For mild or moderate dehydration, you can usually rehydrate at home by drinking certain fluids as told by your health care provider. What are  the risks? Generally, rehydration is safe. However, taking in too much fluid (overhydration) can be a problem. This is rare. Overhydration can cause an electrolyte imbalance, kidney failure, or a decrease in salt (sodium) levels in the body. Supplies needed You will need an oral rehydration solution (ORS) if your health care provider tells you to use one. This is a drink to treat dehydration. It can be found in pharmacies and retail stores. How to rehydrate Fluids Follow instructions from your health care provider for rehydration. The kind of fluid and the amount you should drink depend on your condition. In general, you should choose drinks that you prefer. If told by your health care provider, drink an ORS. Make an ORS by following instructions on the package. Start by drinking small amounts, about  cup (120 mL) every 5-10 minutes. Slowly increase how much you drink until you have taken the amount recommended by your health care provider. Drink enough clear fluids to keep your urine pale yellow. If you were told to drink an ORS, finish it first, then start slowly drinking other clear fluids. Drink fluids such as: Water. This includes sparkling water and flavored water. Drinking only water can lead to having too little sodium in your body (hyponatremia). Follow the advice of your health care provider. Water from ice chips you suck on. Fruit juice with water you add to it (diluted). Sports drinks. Hot or cold herbal teas. Broth-based soups. Milk or milk products. Food Follow instructions from your health care provider about what to eat while you rehydrate. Your health care provider may recommend that you slowly begin eating regular foods in small amounts. Eat foods that contain a healthy balance of electrolytes, such as bananas, oranges, potatoes, tomatoes, and spinach. Avoid foods that are greasy or contain a lot of sugar. In some cases, you may get nutrition through a feeding tube that is  passed through your nose and into your stomach (nasogastric tube, or NG tube). This may be done if you have uncontrolled vomiting or diarrhea. Beverages to avoid Certain beverages may make dehydration worse. While you rehydrate, avoid drinking alcohol. How to tell if you are recovering from dehydration You may be recovering from dehydration if: You are urinating more often than before you started rehydrating. Your urine is pale yellow. Your energy level improves. You vomit less frequently. You have diarrhea less frequently. Your appetite improves or returns to normal. You feel less dizzy or less light-headed. Your skin tone and color start to look more normal. Follow these instructions at home: Take over-the-counter and prescription medicines only as told by your health care provider. Do not take sodium tablets. Doing this can lead to having too much sodium in your body (hypernatremia). Contact a health care provider if: You continue to have symptoms of mild or moderate dehydration, such as: Thirst. Dry lips. Slightly dry mouth. Dizziness. Dark urine or less urine than normal. Muscle cramps. You continue to vomit or have diarrhea. Get help right away if you: Have symptoms of dehydration that get worse. Have a fever. Have a severe headache. Have been vomiting and the following happens: Your vomiting gets worse or does not go away. Your vomit includes blood or green matter (bile). You cannot eat or drink without vomiting. Have problems  with urination or bowel movements, such as: Diarrhea that gets worse or does not go away. Blood in your stool (feces). This may cause stool to look black and tarry. Not urinating, or urinating only a small amount of very dark urine, within 6-8 hours. Have trouble breathing. Have symptoms that get worse with treatment. These symptoms may represent a serious problem that is an emergency. Do not wait to see if the symptoms will go away. Get medical  help right away. Call your local emergency services (911 in the [place] Do not drive yourself to the hospital. Summary Rehydration is the replacement of body fluids and minerals (electrolytes) that are lost during dehydration. Follow instructions from your health care provider for rehydration. The kind of fluid and amount you should drink depend on your condition. Slowly increase how much you drink until you have taken the amount recommended by your health care provider. Contact your health care provider if you continue to show signs of mild or moderate dehydration. This information is not intended to replace advice given to you by your health care provider. Make sure you discuss any questions you have with your health care provider. Document Revised: 02/02/2020 Document Reviewed: 12/13/2019 Elsevier Patient Education  2022 [place]! Go to the app store, search "MyChart", open the app, select [facility], and log in with your MyChart username and password.  Due to Covid, a mask is required upon entering the hospital/clinic. If you do not have a mask, one will be given to you upon arrival. For doctor visits, patients may have 1 support person aged 59 or older with them. For treatment visits, patients cannot have anyone with them due to current Covid guidelines and our immunocompromised population.

## 2021-10-16 NOTE — Assessment & Plan Note (Signed)
Overall, she tolerated pembrolizumab with Lenvima well except for multiple expected side effects such as acquired hypothyroidism, elevated blood pressure and others We will continue treatment for minimum 3 months of treatment before repeating imaging study

## 2021-10-16 NOTE — Progress Notes (Signed)
[facility] OFFICE PROGRESS NOTE  Patient Care Team: [name], [name], MD as PCP - General (Family Medicine)  ASSESSMENT & PLAN:  Endometrial cancer ([place]) Overall, she tolerated pembrolizumab with Lenvima well except for multiple expected side effects such as acquired hypothyroidism, elevated blood pressure and others We will continue treatment for minimum 3 months of treatment before repeating imaging study  Yeast infection of the skin She has signs of yeast infection under the skin fold of the breast I recommend keeping her skin dry She will continue to use nystatin powder The skin rash on her back is not due to this  Dehydration She is at risk of severe dehydration with her recent diarrhea and lost some weight I recommend discontinuation of hydrochlorothiazide and spironolactone until she improves/recovers from diarrhea  Diarrhea The cause of the diarrhea is not from her treatment I recommend Imodium as needed and to avoid dairy and to drink plenty of fluids  Acquired hypothyroidism She was recently started on Synthroid TSH is improving Observe for now  No orders of the defined types were placed in this encounter.   All questions were answered. The patient knows to call the clinic with any problems, questions or concerns. The total time spent in the appointment was 30 minutes encounter with patients including review of chart and various tests results, discussions about plan of care and coordination of care plan   [name], MD 10/16/2021 11:28 AM  INTERVAL HISTORY: Please see below for problem oriented charting. she returns for treatment follow-up on pembrolizumab and Lenvima for recurrent uterine cancer She had significant diarrhea over the past 4 to 5 days since her trip from the beach Usually, her diarrhea happens right after food She felt weak She continues to battle with a skin rash under the breasts area She has been using nystatin  powder Documented blood pressure from home were within normal range Denies recent nausea vomiting  REVIEW OF SYSTEMS:   Constitutional: Denies fevers, chills or abnormal weight loss Eyes: Denies blurriness of vision Ears, nose, mouth, throat, and face: Denies mucositis or sore throat Respiratory: Denies cough, dyspnea or wheezes Cardiovascular: Denies palpitation, chest discomfort or lower extremity swelling Lymphatics: Denies new lymphadenopathy or easy bruising Neurological:Denies numbness, tingling or new weaknesses Behavioral/Psych: Mood is stable, no new changes  All other systems were reviewed with the patient and are negative.  I have reviewed the past medical history, past surgical history, social history and family history with the patient and they are unchanged from previous note.  ALLERGIES:  is allergic to shellfish allergy and penicillins.  MEDICATIONS:  Current Outpatient Medications  Medication Sig Dispense Refill   albuterol (VENTOLIN HFA) 108 (90 Base) MCG/ACT inhaler INHALE 2 PUFFS INTO THE LUNGS EVERY 6 HOURS AS NEEDED FOR WHEEZING OR SHORTNESS OF BREATH (NEED MD APPOINTMENT) 3 each 0   bisoprolol (ZEBETA) 10 MG tablet Take 1 tablet (10 mg total) by mouth in the morning and at bedtime. 180 tablet 0   budesonide-formoterol (SYMBICORT) 80-4.5 MCG/ACT inhaler Inhale 2 puffs into the lungs 2 (two) times daily. 3 Inhaler 3   desonide (DESOWEN) 0.05 % lotion Apply topically 2 (two) times daily. Continue following with dermatologist 59 mL 0   dextromethorphan (DELSYM) 30 MG/5ML liquid Take by mouth as needed for cough.      hydrochlorothiazide (HYDRODIURIL) 25 MG tablet Take 25 mg by mouth daily.     lenvatinib 20 mg daily dose (LENVIMA) 2 x 10 MG capsule Take 1 capsule (  10 mg total) by mouth daily. 30 capsule 11   levothyroxine (SYNTHROID) 50 MCG tablet Take 1 tablet (50 mcg total) by mouth daily before breakfast. 30 tablet 3   lidocaine-prilocaine (EMLA) cream Apply 1  application topically as needed. 30 g 3   loratadine (CLARITIN) 10 MG tablet Take 10 mg by mouth daily as needed for allergies.     neomycin-polymyxin-hydrocortisone (CORTISPORIN) OTIC solution Place 3 drops into the left ear 4 (four) times daily. 10 mL 0   nystatin (MYCOSTATIN/NYSTOP) powder Apply to affected area 2 (two) times daily. 60 g 0   ondansetron (ZOFRAN) 8 MG tablet Take 1 tablet (8 mg total) by mouth every 8 (eight) hours as needed for nausea. 30 tablet 3   pantoprazole (PROTONIX) 40 MG tablet Take 1 tablet (40 mg total) by mouth daily. Patient needs appt for further refills. 90 tablet 0   polyethylene glycol (MIRALAX / GLYCOLAX) 17 g packet Take 17 g by mouth daily.     prochlorperazine (COMPAZINE) 10 MG tablet TAKE 1 TABLET BY MOUTH EVERY 6 HOURS AS NEEDED FOR NAUSEA OR VOMITING 60 tablet 3   RABEprazole (ACIPHEX) 20 MG tablet Take 1 tablet (20 mg total) by mouth daily before breakfast. 90 tablet 3   spironolactone (ALDACTONE) 25 MG tablet Take 1 tablet by mouth daily. 30 tablet 1   vitamin C (ASCORBIC ACID) 500 MG tablet Take 500 mg by mouth daily.     No current facility-administered medications for this visit.   Facility-Administered Medications Ordered in Other Visits  Medication Dose Route Frequency Provider Last Rate Last Admin   heparin lock flush 100 unit/mL  500 Units Intracatheter Once PRN Alvy Bimler, Berman Grainger, MD       pembrolizumab (KEYTRUDA) 200 mg in sodium chloride 0.9 % 50 mL chemo infusion  200 mg Intravenous Once Alvy Bimler, Rakeisha Nyce, MD       sodium chloride flush (NS) 0.9 % injection 10 mL  10 mL Intracatheter PRN [name], MD        SUMMARY OF ONCOLOGIC HISTORY: Oncology History Overview Note  ER/PR neg, MSI stable   Endometrial cancer ([place])  06/15/2018 Surgery   Surgeon: Donaciano Eva     Operation: Robotic-assisted laparoscopic total hysterectomy with bilateral salpingoophorectomy, SLN biopsy     Operative Findings:  : 6cm uterus, normal tubes and ovaries,  omentum adherent to the anterior abdominal wall and uterine fundus. No suspicious nodes.   06/15/2018 Pathology Results   Procedure: Total hysterectomy with bilateral salpingo-oophorectomy. Bilateral obturator sentinel lymph node biopsies. Histologic type: Endometrioid adenocarcinoma. Histologic Grade: FIGO grade 1. Myometrial invasion: Depth of invasion: 15 mm Myometrial thickness: 20 mm Uterine Serosa Involvement: Not identified. Cervical stromal involvement: Not identified. Extent of involvement of other organs: Uninvolved. Lymphovascular invasion: Present. Regional Lymph Nodes: Examined: 2 Sentinel 0 Non-sentinel 2 Total Lymph nodes with metastasis: 0 Isolated tumor cells (< 0.2 mm): 2 Micrometastasis: (> 0.2 mm and < 2.0 mm): 0 Macrometastasis: (> 2.0 mm): 0 Extracapsular extension: N/A. Tumor block for ancillary studies: 4Q-S. MMR / MSI testing: Will be ordered. Pathologic Stage Classification (pTNM, AJCC 8th edition): pT1b, pN0(i+)   07/20/2018 Imaging   1. Surgical changes from recent hysterectomy. No findings suspicious for residual tumor or adenopathy in the pelvis. 2. No CT findings to suggest omental, peritoneal surface or solid organ metastatic disease in the abdomen. 3. Changes consistent with known sarcoidosis in the chest with bulky calcified mediastinal and hilar lymph nodes and interstitial lung disease. I do not  see any definite CT findings to suggest metastatic disease involving the chest.   08/19/2018 - 09/17/2018 Radiation Therapy   08/19/18 - 09/17/18: Vaginal Cuff, 3 cm cylinder with treatment length of 3 cm / 30 Gy delivered in 5 fractions of 6 Gy, Brachytherapy / HDR, Iridium-192   05/2005: Left Chest Wall keloid   04/2004 - 05/2004: Anus, pelvis /inguinal ( Dr. Tammi Klippel)   01/19/2020 Imaging   1. No definite CT findings of the abdomen or pelvis to explain pain. 2. The sigmoid colon and rectum are decompressed although slightly thickened appearing, suggestive of  nonspecific infectious, inflammatory, or ischemic colitis. Correlate for referable clinical symptoms. 3. There is trace, nonspecific free fluid in the low pelvis, which may be reactive, although would be difficult to distinguish from postoperative and post treatment soft tissue change in the pelvis given history of hysterectomy and rectal cancer. 4. Unchanged prominent retroperitoneal and iliac lymph nodes. No new findings suspicious for metastatic disease in the abdomen or pelvis. 5. Bibasilar pulmonary fibrosis and mediastinal and hilar lymphadenopathy, better evaluated by prior CT chest and consistent with patient diagnosis of sarcoidosis.       04/18/2020 Imaging   Venous Doppler Left:  - No evidence of deep vein thrombosis seen in the left lower extremity, from the common femoral through the popliteal veins.  - No evidence of superficial venous thrombosis in the left lower extremity.  - There is no evidence of venous reflux seen in the left lower extremity.  - Rouleax flow noted in the superficial system.    05/18/2020 PET scan   1. Complex scan with a combination of hypermetabolic granulomatous disease (sarcoidosis) as well as hypermetabolic metastatic endometrial carcinoma. 2. Hypermetabolic mediastinal hilar lymph nodes and pulmonary nodularity with bronchovascular thickening is consistent with sarcoidosis. 3. Solitary hypermetabolic nodule in the medial LEFT lower lobe, new from prior with intense metabolic activity, is concerning for a metastatic deposit to the LEFT lower lobe. 4. Ill-defined hypermetabolic soft tissue along the LEFT operator space is most consistent with local recurrence of endometrial carcinoma. There is entrapment of the LEFT ureter by this hypermetabolic soft tissue with mild hydroureter and hydronephrosis on the LEFT. 5. Additionally there appears to be direct extension from the hypermetabolic left operator space soft tissue to the sigmoid colon with there is a  well-defined hypermetabolic lesion in the colon. 6. More distant hypermetabolic peritoneal metastasis along the ventral peritoneal surface of the RIGHT abdomen beneath RIGHT rectus abdominus muscle. 7. Hypermetabolic lymph nodes in the porta hepatis are favored related to sarcoidosis.   05/19/2020 Cancer Staging   Staging form: Corpus Uteri - Carcinoma and Carcinosarcoma, AJCC 8th Edition - Clinical stage from 05/19/2020: Stage IVB (rcT1, cN2a, cM1) - Signed by [name], MD on 05/19/2020    05/26/2020 Procedure   Successful placement of a right internal jugular approach power injectable Port-A-Cath. The catheter is ready for immediate use   05/29/2020 - 09/27/2020 Chemotherapy   The patient had carboplatin and taxol for chemotherapy treatment.     07/31/2020 Imaging   1. Asymmetric soft tissue mass extending from the left vaginal cuff has decreased in size in the interval. No residual peritoneal nodularity. Pulmonary metastases are stable with exception of a minimally improved left lower lobe nodule. 2. Stable soft tissue nodule associated with the right rectus abdominus muscle. 3. Interval resolution of left hydronephrosis. 4. Calcified mediastinal and hilar lymph nodes, together with pulmonary parenchymal fibrosis, findings which are in keeping with patient's known diagnosis of  sarcoid. 5. Aortic atherosclerosis (ICD10-I70.0). Coronary artery calcification.   10/27/2020 Imaging   1. Essentially stable appearance of the soft tissue density nodule posteriorly in the left rectus abdominus muscle, currently 1.8 by 1.2 cm, previously 11 mm. 2. Stable calcified adenopathy in the mediastinum and hila, compatible with prior granulomatous disease. 3. Stable small right lower lobe pulmonary nodules. 4. Stable soft tissue density prominence along the left vaginal cuff extending up towards the adnexa, probably therapy related. 5. Other imaging findings of potential clinical significance: Coronary  atherosclerosis. Small type 1 hiatal hernia. Stable subpleural reticulation favoring the lung bases, possibly from mild fibrosis. 6. Aortic atherosclerosis.   01/19/2021 Imaging   1. Status post hysterectomy. Interval enlargement of soft tissue adjacent to the left aspect of the vaginal cuff and bladder, measuring approximately 4.3 x 2.9 cm in axial section, previously previously previously 3.7 x 1.8 cm when measured similarly. Extensive soft tissue thickening and stranding throughout the low pelvis. 2. Interval enlargement of a metastatic nodule within the right rectus abdominus. New small peritoneal nodule anterior to the sigmoid colon. 3. Interval enlargement of retroperitoneal retroperitoneal lymph nodes. 4. Multiple small bilateral pulmonary nodules, several of which are new and enlarged. 5. Constellation of findings above is consistent with worsened metastatic disease. 6. New left hydronephrosis and hydroureter, the distal left ureter obstructed by soft tissue near the left aspect of the urinary bladder and vaginal cuff. 7. Unchanged enlarged, partially calcified mediastinal and hilar lymph nodes,, in keeping with prior granulomatous infection or sarcoidosis. 8. Mild underlying pulmonary fibrosis, in a UIP pattern. 9. Coronary artery disease.   02/02/2021 - 07/13/2021 Chemotherapy    Patient is on carboplatin and docetaxel    04/17/2021 Imaging   1. Interval decrease in size of multiple pulmonary nodules, with resolution of some previously seen nodules. 2. Interval decrease in size of enlarged retroperitoneal lymph nodes. 3. Interval decrease in size of soft tissue at the left aspect of the vaginal cuff status post hysterectomy. 4. Interval decrease in size of a soft tissue nodule within the posterior aspect of the right rectus abdominus muscle body. 5. Constellation of findings is consistent with treatment response of locally recurrent, nodal, and distant metastatic disease. 6. There has  been interval resolution of previously seen left hydronephrosis and hydroureter. 7. 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   Stable mild partially calcified mediastinal and bilateral hilar lymphadenopathy.   Stable sub-cm abdominal retroperitoneal lymph nodes.   Aortic Atherosclerosis (ICD10-I70.0).     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     02/02/2021 - 07/13/2021 Chemotherapy    Patient is on carboplatin  and docetaxel    09/03/2021 -  Chemotherapy   Patient is on Treatment Plan : UTERINE Lenvatinib + Pembrolizumab q21d       PHYSICAL EXAMINATION: ECOG PERFORMANCE STATUS: 1 - Symptomatic but completely ambulatory  Vitals:   10/16/21 1040  BP: (!) 145/77  Pulse: 70  Resp: 18  Temp: 98.1 F (36.7 C)  SpO2: 98%   Filed Weights   10/16/21 1040  Weight: 213 lb 12.8 oz (97 kg)    GENERAL:alert, no distress and comfortable SKIN: She has persistent yeast infection under the skin fold of her breast EYES: normal, Conjunctiva are pink and non-injected, sclera clear OROPHARYNX:no exudate, no erythema and lips, buccal mucosa,  and tongue normal  NECK: supple, thyroid normal size, non-tender, without nodularity LYMPH:  no palpable lymphadenopathy in the cervical, axillary or inguinal LUNGS: clear to auscultation and percussion with normal breathing effort HEART: regular rate & rhythm and no murmurs with stable moderate lower extremity edema ABDOMEN:abdomen soft, non-tender and normal bowel sounds Musculoskeletal:no cyanosis of digits and no clubbing  NEURO: alert & oriented x 3 with fluent speech, no focal motor/sensory deficits  LABORATORY DATA:  I have reviewed the data as listed    Component Value Date/Time   NA 140 10/16/2021 1006   K 3.9 10/16/2021 1006   CL 105 10/16/2021 1006   CO2 24 10/16/2021 1006   GLUCOSE 122 (H) 10/16/2021 1006   BUN 18 10/16/2021 1006   CREATININE 0.96 10/16/2021 1006   CALCIUM 9.0 10/16/2021 1006   PROT 7.9 10/16/2021 1006   ALBUMIN 3.5 10/16/2021 1006   AST 27 10/16/2021 1006   ALT 33 10/16/2021 1006   ALKPHOS 114 10/16/2021 1006   BILITOT 0.6 10/16/2021 1006   GFRNONAA >60 10/16/2021 1006   GFRAA >60 09/08/2020 0819    No results found for: SPEP, UPEP  Lab Results  Component Value Date   WBC 6.3 10/16/2021   NEUTROABS 3.9 10/16/2021   HGB 10.9 (L) 10/16/2021   HCT 31.1 (L) 10/16/2021   MCV 95.7 10/16/2021   PLT 167 10/16/2021      Chemistry      Component Value Date/Time   NA 140 10/16/2021 1006   K 3.9 10/16/2021 1006   CL 105 10/16/2021 1006   CO2 24 10/16/2021 1006   BUN 18 10/16/2021 1006   CREATININE 0.96 10/16/2021 1006      Component Value Date/Time   CALCIUM 9.0 10/16/2021 1006   ALKPHOS 114 10/16/2021 1006   AST 27 10/16/2021 1006   ALT 33 10/16/2021 1006   BILITOT 0.6 10/16/2021 1006

## 2021-10-17 LAB — T4: T4, Total: 6.7 ug/dL (ref 4.5–12.0)

## 2021-10-19 ENCOUNTER — Telehealth: Payer: Self-pay

## 2021-10-19 NOTE — Telephone Encounter (Signed)
She called back. She is feeling good. Diarrhea has resolved and normal bm yesterday. She appreciated the call and will call the office back if needed.

## 2021-10-19 NOTE — Telephone Encounter (Signed)
-----   Message from [name], MD sent at 10/19/2021  8:29 AM EDT ----- Can you check on her? Is her diarrhea better?

## 2021-10-19 NOTE — Telephone Encounter (Signed)
Called and left a message asking her to call the office back. 

## 2021-10-22 ENCOUNTER — Ambulatory Visit (INDEPENDENT_AMBULATORY_CARE_PROVIDER_SITE_OTHER): Payer: Medicare HMO

## 2021-10-22 VITALS — Ht 65.5 in | Wt 213.0 lb

## 2021-10-22 DIAGNOSIS — Z0000 Encounter for general adult medical examination without abnormal findings: Secondary | ICD-10-CM

## 2021-10-22 NOTE — Progress Notes (Signed)
I connected with [name] today by telephone and verified that I am speaking with the correct person using two identifiers. Location patient: home Location provider: work Persons participating in the virtual visit: Derika, Eckles LPN.   I discussed the limitations, risks, security and privacy concerns of performing an evaluation and management service by telephone and the availability of in person appointments. I also discussed with the patient that there may be a patient responsible charge related to this service. The patient expressed understanding and verbally consented to this telephonic visit.    Interactive audio and video telecommunications were attempted between this provider and patient, however failed, due to patient having technical difficulties OR patient did not have access to video capability.  We continued and completed visit with audio only.     Vital signs may be patient reported or missing.  Subjective:   [name] is a 65 y.o. female who presents for Medicare Annual (Subsequent) preventive examination.  Review of Systems     Cardiac Risk Factors include: hypertension;obesity (BMI >30kg/m2)     Objective:    Today's Vitals   10/22/21 0942  Weight: 213 lb (96.6 kg)  Height: 5' 5.5" (1.664 m)   Body mass index is 34.91 kg/m.  Advanced Directives 10/22/2021 06/12/2021 02/06/2021 02/05/2021 02/02/2021 10/03/2020 09/08/2020  Does Patient Have a Medical Advance Directive? No No No No No No No  Would patient like information on creating a medical advance directive? - No - Patient declined No - Patient declined No - Patient declined No - Patient declined Yes (MAU/Ambulatory/Procedural Areas - Information given) No - Patient declined    Current Medications (verified) Outpatient Encounter Medications as of 10/22/2021  Medication Sig   albuterol (VENTOLIN HFA) 108 (90 Base) MCG/ACT inhaler INHALE 2 PUFFS INTO THE LUNGS EVERY 6 HOURS AS NEEDED FOR  WHEEZING OR SHORTNESS OF BREATH (NEED MD APPOINTMENT)   bisoprolol (ZEBETA) 10 MG tablet Take 1 tablet (10 mg total) by mouth in the morning and at bedtime.   budesonide-formoterol (SYMBICORT) 80-4.5 MCG/ACT inhaler Inhale 2 puffs into the lungs 2 (two) times daily.   desonide (DESOWEN) 0.05 % lotion Apply topically 2 (two) times daily. Continue following with dermatologist   dextromethorphan (DELSYM) 30 MG/5ML liquid Take by mouth as needed for cough.    lenvatinib 20 mg daily dose (LENVIMA) 2 x 10 MG capsule Take 1 capsule (10 mg total) by mouth daily.   levothyroxine (SYNTHROID) 50 MCG tablet Take 1 tablet (50 mcg total) by mouth daily before breakfast.   lidocaine-prilocaine (EMLA) cream Apply 1 application topically as needed.   loratadine (CLARITIN) 10 MG tablet Take 10 mg by mouth daily as needed for allergies.   neomycin-polymyxin-hydrocortisone (CORTISPORIN) OTIC solution Place 3 drops into the left ear 4 (four) times daily.   nystatin (MYCOSTATIN/NYSTOP) powder Apply to affected area 2 (two) times daily.   ondansetron (ZOFRAN) 8 MG tablet Take 1 tablet (8 mg total) by mouth every 8 (eight) hours as needed for nausea.   pantoprazole (PROTONIX) 40 MG tablet Take 1 tablet (40 mg total) by mouth daily. Patient needs appt for further refills.   polyethylene glycol (MIRALAX / GLYCOLAX) 17 g packet Take 17 g by mouth daily.   prochlorperazine (COMPAZINE) 10 MG tablet TAKE 1 TABLET BY MOUTH EVERY 6 HOURS AS NEEDED FOR NAUSEA OR VOMITING   RABEprazole (ACIPHEX) 20 MG tablet Take 1 tablet (20 mg total) by mouth daily before breakfast.   spironolactone (ALDACTONE) 25 MG tablet Take  1 tablet by mouth daily.   vitamin C (ASCORBIC ACID) 500 MG tablet Take 500 mg by mouth daily.   hydrochlorothiazide (HYDRODIURIL) 25 MG tablet Take 25 mg by mouth daily. (Patient not taking: Reported on 10/22/2021)   No facility-administered encounter medications on file as of 10/22/2021.    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  ChemoXRT 2005 Dr Collier Salina Rubin/[facility]   Upper airway cough syndrome    Wears glasses    Past Surgical History:  Procedure Laterality Date   BRONCHOSCOPY  01/22/2008   w/ Lavage and bronchial bx   CARDIOVASCULAR STRESS TEST  06/27/2006   normal nuclear study w/ no ischemia/  normal LV function and wall motion , ef 61%   CARPAL TUNNEL RELEASE Right 01/17/2016   and finger trigger release   CLEFT LIP REPAIR     CO2 LASER ABLATION VULVA     EUA/  EXCISIONAL MULTIPLE PERINEAL/ANAL BX'S/  SIGMOIDSCOPY/  PORT-A-CATH PLACEMENT  03/27/2004   Local excision - Dr Lennie Hummer   IR IMAGING GUIDED PORT INSERTION  05/26/2020   LAPAROSCOPIC CHOLECYSTECTOMY  08/14/2008   MULTIPLE ANAL BX'S  10/10/2006   RELEASE A1 PULLEY THUMB Bilateral left 12-17-2006/  right 09-05-2009   REMOVAL PORT-A-CATH/  EUA/  MULTIPLE ANAL BX'S  10/16/2004   ROBOTIC ASSISTED TOTAL HYSTERECTOMY WITH BILATERAL SALPINGO OOPHERECTOMY Bilateral 06/25/2018   Procedure: XI ROBOTIC ASSISTED TOTAL HYSTERECTOMY WITH BILATERAL SALPINGO OOPHORECTOMY;  Surgeon: Everitt Amber, MD;  Location: [facility];   Service: Gynecology;  Laterality: Bilateral;   SENTINEL NODE BIOPSY N/A 06/25/2018   Procedure: SENTINEL NODE BIOPSY;  Surgeon: Everitt Amber, MD;  Location: [facility];  Service: Gynecology;  Laterality: N/A;   TENNIS ELBOW RELEASE/NIRSCHEL PROCEDURE Right 11/28/2016   Procedure: RIGHT ELBOW ULNER NERVE RELEASE AND OR TRANSPOSITION;  Surgeon: Iran Planas, MD;  Location: [place];  Service: Orthopedics;  Laterality: Right;   Family History  Problem Relation Age of Onset   Allergies Mother    Asthma Mother    Lung cancer Mother    Allergies Sister    Allergies Brother    Asthma Brother    Asthma Sister    Sarcoidosis Neg Hx    Colon cancer Neg Hx    Liver cancer Neg Hx    Stomach cancer Neg Hx    Esophageal cancer Neg Hx    Rectal cancer Neg Hx    Social History   Socioeconomic History   Marital status: Single    Spouse name: Not on  file   Number of children: 2   Years of education: Not on file   Highest education level: Not on file  Occupational History   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[employer] [place] Use   Smoking status: Former    Packs/day: 0.50    Years: 1.00    Pack years: 0.50    Types: Cigarettes    Quit date: 12/16/1993    Years since quitting: 27.8   Smokeless tobacco: Never  Vaping Use   Vaping Use: Never used  Substance and Sexual Activity   Alcohol use: Yes    Comment: occasional   Drug use: No   Sexual activity: Not Currently  Other Topics Concern   Not on file  Social History Narrative   Not on file   Social Determinants of Health   Financial Resource Strain: Low Risk    Difficulty of Paying Living Expenses: Not hard at all  Food Insecurity: No Food Insecurity   Worried About Charity fundraiser in the Last Year: Never true   [place] in the Last Year: Never true  Transportation Needs: No Transportation Needs   Lack of Transportation (Medical): No   Lack of Transportation (Non-Medical): No  Physical Activity:  Sufficiently Active   Days of Exercise per Week: 3 days   Minutes of Exercise per Session: 60 min  Stress: No Stress Concern Present   Feeling of Stress : Not at all  Social Connections: Not on file    Tobacco Counseling Counseling given: Not Answered   Clinical Intake:  Pre-visit preparation completed: Yes  Pain : No/denies pain     Nutritional Status: BMI > 30  Obese Nutritional Risks: Nausea/ vomitting/ diarrhea (diarrhea last week has since resolved) Diabetes: No  How often do you need to have someone help you when you read instructions, pamphlets, or other written materials from your doctor or pharmacy?: 1 - Never What is the last grade level you completed in school?: BS degree  Diabetic? no  Interpreter Needed?: No  Information entered by :: NAllen LPN   Activities of Daily Living In your present state of health, do you have any difficulty performing the following activities: 10/22/2021  Hearing? N  Vision? N  Difficulty concentrating or making decisions? N  Walking or climbing stairs? N  Dressing or bathing? N  Doing errands, shopping? N  Preparing Food and eating ? N  Using the Toilet? N  In the past six months, have you accidently leaked urine? Y  Do you have problems with loss of bowel control? N  Managing your Medications? N  Managing your Finances? N  Housekeeping or managing your Housekeeping? N  Some recent data might be hidden    Patient Care Team: [name], [name], MD as PCP - General (Family Medicine)  Indicate any recent Medical Services you may have received from other than [facility] providers in the past year (date may be approximate).     Assessment:   This is a routine wellness examination for [place].  Hearing/Vision screen Vision Screening - Comments:: Regular eye exams, [facility]  Dietary issues and exercise activities discussed: Current Exercise Habits: Home exercise routine, Type of exercise: treadmill, Time (Minutes):  60, Frequency (Times/Week): 3, Weekly Exercise (Minutes/Week): 180   Goals Addressed             This Visit's Progress    Patient Stated       10/22/2021, beat cancer  Depression Screen PHQ 2/9 Scores 10/22/2021 10/03/2020 09/29/2019 10/22/2018 07/22/2018 09/12/2016 08/12/2016  PHQ - 2 Score 0 0 0 0 1 0 0    Fall Risk Fall Risk  10/22/2021 10/03/2020 09/29/2019 10/22/2018 07/22/2018  Falls in the past year? 0 0 0 0 No  Number falls in past yr: - 0 0 - -  Injury with Fall? - 0 0 - -  Risk for fall due to : Medication side effect Impaired vision;Impaired balance/gait - - -  Follow up Falls evaluation completed;Education provided;Falls prevention discussed Falls prevention discussed Education provided - -    FALL RISK PREVENTION PERTAINING TO THE HOME:  Any stairs in or around the home? Yes  If so, are there any without handrails? No  Home free of loose throw rugs in walkways, pet beds, electrical cords, etc? Yes  Adequate lighting in your home to reduce risk of falls? Yes   ASSISTIVE DEVICES UTILIZED TO PREVENT FALLS:  Life alert? No  Use of a cane, walker or w/c? No  Grab bars in the bathroom? Yes  Shower chair or bench in shower? Yes  Elevated toilet seat or a handicapped toilet? Yes   TIMED UP AND GO:  Was the test performed? No .  .     Cognitive Function:     6CIT Screen 10/22/2021 10/03/2020  What Year? 0 points 0 points  What month? 0 points 0 points  What time? 0 points -  Count back from 20 0 points 0 points  Months in reverse 2 points 0 points  Repeat phrase 8 points 0 points  Total Score 10 -    Immunizations Immunization History  Administered Date(s) Administered   Influenza Split 10/01/2011, 09/23/2012   Influenza Whole 09/15/2010   PFIZER(Purple Top)SARS-COV-2 Vaccination 03/10/2020, 03/31/2020, 09/12/2020   Tdap 07/06/2013    TDAP status: Up to date  Flu Vaccine status: Declined, Education has been provided regarding the importance of  this vaccine but patient still declined. Advised may receive this vaccine at local pharmacy or Health Dept. Aware to provide a copy of the vaccination record if obtained from local pharmacy or Health Dept. Verbalized acceptance and understanding.  Pneumococcal vaccine status: Declined,  Education has been provided regarding the importance of this vaccine but patient still declined. Advised may receive this vaccine at local pharmacy or Health Dept. Aware to provide a copy of the vaccination record if obtained from local pharmacy or Health Dept. Verbalized acceptance and understanding.   Covid-19 vaccine status: Completed vaccines  Qualifies for Shingles Vaccine? Yes   Zostavax completed No   Shingrix Completed?: No.    Education has been provided regarding the importance of this vaccine. Patient has been advised to call insurance company to determine out of pocket expense if they have not yet received this vaccine. Advised may also receive vaccine at local pharmacy or Health Dept. Verbalized acceptance and understanding.  Screening Tests Health Maintenance  Topic Date Due   Zoster Vaccines- Shingrix (1 of 2) Never done   PAP SMEAR-Modifier  09/13/2019   COVID-19 Vaccine (4 - Booster for Pfizer series) 11/07/2020   Pneumococcal Vaccine 15-62 Years old (1 - PCV) 10/22/2022 (Originally 12/06/1962)   MAMMOGRAM  10/22/2022 (Originally 11/07/2016)   COLONOSCOPY (Pts 45-58yrs Insurance coverage will need to be confirmed)  02/21/2022   TETANUS/TDAP  07/07/2023   Hepatitis C Screening  Completed   HIV Screening  Completed   HPV VACCINES  Aged Out   INFLUENZA VACCINE  Discontinued  Health Maintenance  Health Maintenance Due  Topic Date Due   Zoster Vaccines- Shingrix (1 of 2) Never done   PAP SMEAR-Modifier  09/13/2019   COVID-19 Vaccine (4 - Booster for Pfizer series) 11/07/2020    Colorectal cancer screening: Type of screening: Colonoscopy. Completed 02/22/2020. Repeat every 2  years  Mammogram status: No longer required due to oncologist.  Bone Density status: Completed 11/29/2019.   Lung Cancer Screening: (Low Dose CT Chest recommended if Age 66-80 years, 30 pack-year currently smoking OR have quit w/in 15years.) does not qualify.   Lung Cancer Screening Referral: no  Additional Screening:  Hepatitis C Screening: does qualify; Completed 01/20/2008  Vision Screening: Recommended annual ophthalmology exams for early detection of glaucoma and other disorders of the eye. Is the patient up to date with their annual eye exam?  Yes  Who is the provider or what is the name of the office in which the patient attends annual eye exams? [facility] If pt is not established with a provider, would they like to be referred to a provider to establish care? No .   Dental Screening: Recommended annual dental exams for proper oral hygiene  Community Resource Referral / Chronic Care Management: CRR required this visit?  No   CCM required this visit?  No      Plan:     I have personally reviewed and noted the following in the patient's chart:   Medical and social history Use of alcohol, tobacco or illicit drugs  Current medications and supplements including opioid prescriptions.  Functional ability and status Nutritional status Physical activity Advanced directives List of other physicians Hospitalizations, surgeries, and ER visits in previous 12 months Vitals Screenings to include cognitive, depression, and falls Referrals and appointments  In addition, I have reviewed and discussed with patient certain preventive protocols, quality metrics, and best practice recommendations. A written personalized care plan for preventive services as well as general preventive health recommendations were provided to patient.     Kellie Simmering, LPN   [id number]/[id number]   Nurse Notes: none

## 2021-10-22 NOTE — Patient Instructions (Signed)
[name] , Thank you for taking time to come for your Medicare Wellness Visit. I appreciate your ongoing commitment to your health goals. Please review the following plan we discussed and let me know if I can assist you in the future.   Screening recommendations/referrals: Colonoscopy: completed 02/22/2020, due 02/21/2022 Mammogram: hold per oncologist Bone Density: completed 11/29/2019 Recommended yearly ophthalmology/optometry visit for glaucoma screening and checkup Recommended yearly dental visit for hygiene and checkup  Vaccinations: Influenza vaccine: decline Pneumococcal vaccine: decline Tdap vaccine: completed 07/06/2013, due 07/07/2023 Shingles vaccine: discussed  Covid-19:  09/12/2020, 03/31/2020, 03/10/2020  Advanced directives: Advance directive discussed with you today.   Conditions/risks identified: none  Next appointment: Follow up in one year for your annual wellness visit.   Preventive Care 40-64 Years, Female Preventive care refers to lifestyle choices and visits with your health care provider that can promote health and wellness. What does preventive care include? A yearly physical exam. This is also called an annual well check. Dental exams once or twice a year. Routine eye exams. Ask your health care provider how often you should have your eyes checked. Personal lifestyle choices, including: Daily care of your teeth and gums. Regular physical activity. Eating a healthy diet. Avoiding tobacco and drug use. Limiting alcohol use. Practicing safe sex. Taking low-dose aspirin daily starting at age 66. Taking vitamin and mineral supplements as recommended by your health care provider. What happens during an annual well check? The services and screenings done by your health care provider during your annual well check will depend on your age, overall health, lifestyle risk factors, and family history of disease. Counseling  Your health care provider may ask you  questions about your: Alcohol use. Tobacco use. Drug use. Emotional well-being. Home and relationship well-being. Sexual activity. Eating habits. Work and work Statistician. Method of birth control. Menstrual cycle. Pregnancy history. Screening  You may have the following tests or measurements: Height, weight, and BMI. Blood pressure. Lipid and cholesterol levels. These may be checked every 5 years, or more frequently if you are over 48 years old. Skin check. Lung cancer screening. You may have this screening every year starting at age 51 if you have a 30-pack-year history of smoking and currently smoke or have quit within the past 15 years. Fecal occult blood test (FOBT) of the stool. You may have this test every year starting at age 35. Flexible sigmoidoscopy or colonoscopy. You may have a sigmoidoscopy every 5 years or a colonoscopy every 10 years starting at age 60. Hepatitis C blood test. Hepatitis B blood test. Sexually transmitted disease (STD) testing. Diabetes screening. This is done by checking your blood sugar (glucose) after you have not eaten for a while (fasting). You may have this done every 1-3 years. Mammogram. This may be done every 1-2 years. Talk to your health care provider about when you should start having regular mammograms. This may depend on whether you have a family history of breast cancer. BRCA-related cancer screening. This may be done if you have a family history of breast, ovarian, tubal, or peritoneal cancers. Pelvic exam and Pap test. This may be done every 3 years starting at age 63. Starting at age 26, this may be done every 5 years if you have a Pap test in combination with an HPV test. Bone density scan. This is done to screen for osteoporosis. You may have this scan if you are at high risk for osteoporosis. Discuss your test results, treatment options, and if necessary,  the need for more tests with your health care provider. Vaccines  Your health  care provider may recommend certain vaccines, such as: Influenza vaccine. This is recommended every year. Tetanus, diphtheria, and acellular pertussis (Tdap, Td) vaccine. You may need a Td booster every 10 years. Zoster vaccine. You may need this after age 60. Pneumococcal 13-valent conjugate (PCV13) vaccine. You may need this if you have certain conditions and were not previously vaccinated. Pneumococcal polysaccharide (PPSV23) vaccine. You may need one or two doses if you smoke cigarettes or if you have certain conditions. Talk to your health care provider about which screenings and vaccines you need and how often you need them. This information is not intended to replace advice given to you by your health care provider. Make sure you discuss any questions you have with your health care provider. Document Released: 12/29/2015 Document Revised: 08/21/2016 Document Reviewed: 10/03/2015 Elsevier Interactive Patient Education  2017 Else[employer] Inc[employer]    Fall Prevention in the Home Falls can cause injuries. They can happen to people of all ages. There are many things you can do to make your home safe and to help prevent falls. What can I do on the outside of my home? Regularly fix the edges of walkways and driveways and fix any cracks. Remove anything that might make you trip as you walk through a door, such as a raised step or threshold. Trim any bushes or trees on the path to your home. Use bright outdoor lighting. Clear any walking paths of anything that might make someone trip, such as rocks or tools. Regularly check to see if handrails are loose or broken. Make sure that both sides of any steps have handrails. Any raised decks and porches should have guardrails on the edges. Have any leaves, snow, or ice cleared regularly. Use sand or salt on walking paths during winter. Clean up any spills in your garage right away. This includes oil or grease spills. What can I do in the bathroom? Use  night lights. Install grab bars by the toilet and in the tub and shower. Do not use towel bars as grab bars. Use non-skid mats or decals in the tub or shower. If you need to sit down in the shower, use a plastic, non-slip stool. Keep the floor dry. Clean up any water that spills on the floor as soon as it happens. Remove soap buildup in the tub or shower regularly. Attach bath mats securely with double-sided non-slip rug tape. Do not have throw rugs and other things on the floor that can make you trip. What can I do in the bedroom? Use night lights. Make sure that you have a light by your bed that is easy to reach. Do not use any sheets or blankets that are too big for your bed. They should not hang down onto the floor. Have a firm chair that has side arms. You can use this for support while you get dressed. Do not have throw rugs and other things on the floor that can make you trip. What can I do in the kitchen? Clean up any spills right away. Avoid walking on wet floors. Keep items that you use a lot in easy-to-reach places. If you need to reach something above you, use a strong step stool that has a grab bar. Keep electrical cords out of the way. Do not use floor polish or wax that makes floors slippery. If you must use wax, use non-skid floor wax. Do not have throw rugs   and other things on the floor that can make you trip. What can I do with my stairs? Do not leave any items on the stairs. Make sure that there are handrails on both sides of the stairs and use them. Fix handrails that are broken or loose. Make sure that handrails are as long as the stairways. Check any carpeting to make sure that it is firmly attached to the stairs. Fix any carpet that is loose or worn. Avoid having throw rugs at the top or bottom of the stairs. If you do have throw rugs, attach them to the floor with carpet tape. Make sure that you have a light switch at the top of the stairs and the bottom of the  stairs. If you do not have them, ask someone to add them for you. What else can I do to help prevent falls? Wear shoes that: Do not have high heels. Have rubber bottoms. Are comfortable and fit you well. Are closed at the toe. Do not wear sandals. If you use a stepladder: Make sure that it is fully opened. Do not climb a closed stepladder. Make sure that both sides of the stepladder are locked into place. Ask someone to hold it for you, if possible. Clearly mark and make sure that you can see: Any grab bars or handrails. First and last steps. Where the edge of each step is. Use tools that help you move around (mobility aids) if they are needed. These include: Canes. Walkers. Scooters. Crutches. Turn on the lights when you go into a dark area. Replace any light bulbs as soon as they burn out. Set up your furniture so you have a clear path. Avoid moving your furniture around. If any of your floors are uneven, fix them. If there are any pets around you, be aware of where they are. Review your medicines with your doctor. Some medicines can make you feel dizzy. This can increase your chance of falling. Ask your doctor what other things that you can do to help prevent falls. This information is not intended to replace advice given to you by your health care provider. Make sure you discuss any questions you have with your health care provider. Document Released: 09/28/2009 Document Revised: 05/09/2016 Document Reviewed: 01/06/2015 Elsevier Interactive Patient Education  2017 [employer] Inc.  

## 2021-10-24 ENCOUNTER — Ambulatory Visit: Payer: Medicare HMO

## 2021-10-30 ENCOUNTER — Ambulatory Visit: Payer: Medicare HMO | Admitting: Internal Medicine

## 2021-10-31 ENCOUNTER — Other Ambulatory Visit: Payer: Self-pay

## 2021-10-31 ENCOUNTER — Ambulatory Visit (INDEPENDENT_AMBULATORY_CARE_PROVIDER_SITE_OTHER): Payer: Medicare HMO | Admitting: Internal Medicine

## 2021-10-31 ENCOUNTER — Encounter: Payer: Self-pay | Admitting: Internal Medicine

## 2021-10-31 ENCOUNTER — Ambulatory Visit: Payer: Medicare HMO | Admitting: Internal Medicine

## 2021-10-31 DIAGNOSIS — D869 Sarcoidosis, unspecified: Secondary | ICD-10-CM | POA: Diagnosis not present

## 2021-10-31 DIAGNOSIS — R058 Other specified cough: Secondary | ICD-10-CM | POA: Diagnosis not present

## 2021-10-31 MED ORDER — ALBUTEROL SULFATE HFA 108 (90 BASE) MCG/ACT IN AERS: INHALATION_SPRAY | RESPIRATORY_TRACT | 2 refills | Status: AC

## 2021-10-31 MED ORDER — MOMETASONE FURO-FORMOTEROL FUM 100-5 MCG/ACT IN AERO: INHALATION_SPRAY | RESPIRATORY_TRACT | 3 refills | Status: DC

## 2021-10-31 NOTE — Patient Instructions (Signed)
Plan A = Automatic = Always=    Dulera 100 up to 2 puffs every 12 hours if have breathing problems or cough   Work on inhaler technique:  relax and gently blow all the way out then take a nice smooth full deep breath back in, triggering the inhaler at same time you start breathing in.  Hold for up to 5 seconds if you can. Blow out thru nose. Rinse and gargle with water when done.  If mouth or throat bother you at all,  try brushing teeth/gums/tongue with arm and hammer toothpaste/ make a slurry and gargle and spit out.     Plan B = Backup (to supplement plan A, not to replace it) Only use your albuterol inhaler (RED for emergency) as a rescue medication to be used if you can't catch your breath by resting or doing a relaxed purse lip breathing pattern.  - The less you use it, the better it will work when you need it. - Ok to use the inhaler up to 2 puffs  every 4 hours if you must but call for appointment if use goes up over your usual need - Don't leave home without it !!  (think of it like the spare tire for your car)    Please schedule a follow up visit in 3 months but call sooner if needed

## 2021-10-31 NOTE — Assessment & Plan Note (Addendum)
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  - Chronic pred rx 2/09> 04/2008  - Restart Pred December 26, 2008 > June 12, 2009 stopped  > restarted 07/11/11>>pt stopped 10/15/11  - PFT's March 23, 2010 VC 1.77 (66%) no obst, ERV 34%, DLCO 53 > 115% corrected - PFT's 09/30/12       VC 2.02 (57%) ratio 78 , erv 13%  DLCO 55 > 118% corrected - Skin bx 05/19/14 bridge of nose > Pos NCG - 05/02/2015  Walked RA x 3 laps @ 185 ft each stopped due to end of study, no sob/ no desat at nl pace/ no cough provoked  - PFT's  08/22/2016 VC  1.81 ratio 82  with DLCO  59/55 % corrects to 88 % for alv volume  - Spirometry 05/07/2017   FVC  1.81  With no obst by fev1/fvc and no significant curvature  - 05/07/2017  After extensive coaching HFA effectiveness =    75% from a baseline of < 25%  - 05/22/2017   Walked RA x one lap @ 185 stopped due to  Sob at fast pace with sats 96% - 09/09/17 placed on floor dose of 5 mg daily and flared late Oct 2018 when ran out of rx  - Spirometry 06/16/2018  FVC  1.82 (62%) s obst  - 06/16/2018   Walked RA x one lap @ 185 stopped due to  Sob, mod fast pace, no desats  - 09/17/2018   Walked RA x one lap @ 185 stopped due to  Sob/ no desat on ? Dose prednisone with ESR 74 so rec pred 20 ub then taper to 5 mg   - 01/14/2019    try dulera 100 2bid x 2 week sample then return in 2 weeks to regroup/ count use > only 20 puffs gone, confused with 2bid instruction > 06/29/2019 some better on duera 100 but can't afford it > changed to symb 80  - 06/29/2019   Walked RA x one lap =  approx 250 ft - stopped due to  Sob avg pace, no desats - 09/30/2019   Walked RA  2 laps @  approx 238ft each @ moderate pace  stopped due to fatigue with sats still 91%  - 11/07/2020 rec reduce prednisone to 5 mg qod > d/c'd by Alvy Bimler  08/22/21 to start on immunotherapy  - Chest CT with contrast 07/25/21 Mediastinum/Lymph Nodes: Partially calcified mild mediastinal and bilateral hilar lymphadenopathy remain stable since prior study. No new or increased  sites of lymphadenopathy identified. Lungs/Pleura: Bilateral pleural-parenchymal scarring remains stable. No suspicious pulmonary nodules or masses identified. No evidence of infiltrate or pleural effusion.  No evidence of a significant flare of airway or parenchymal dz so far and tol off steroids s apparent  adrenal insuff > no change recs          Each maintenance medication was reviewed in detail including emphasizing most importantly the difference between maintenance and prns and under what circumstances the prns are to be triggered using an action plan format where appropriate.  Total time for H and P, chart review, counseling, reviewing hfa device(s) and generating customized AVS unique to this office visit / same day charting  > 30 min

## 2021-10-31 NOTE — Progress Notes (Signed)
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   Last worked  Prior to  [facility] surgery Nov 28 2017     History of Present Illness  Last ov 02/17/13 no evidence active sarcoid rec Aciphex 20 mg Take 30-60 min before first meal of the day also when coughing for any reason take Pepcid 20 mg one at bedtime  Take delsym two tsp every 12 hours and supplement if needed with  tramadol 50 mg up to 2 every 4 hours to suppress the urge to cough. Swallowing water or using ice chips/non mint and menthol containing candies (such as lifesavers or sugarless jolly ranchers) are also effective.  You should rest your voice and avoid activities that you know make you cough. Once you have eliminated the cough for 3 straight days try reducing the tramadol first,  then the delsym as tolerated.   GERD diet    01/28/2014  Pulmonary consultation / Milisa Kimbell/ on no maint resp rx / rare baseline need for saba  Chief Complaint  Patient presents with   Pulmonary Consult    Referred per Dr. Posey Pronto. [name] Cough is prod with moderate yellow sputum. She is using proair approx tid.  Onset was Jan 01 2014 while on aciphex with bfast with flu like illness but not follow contingency rx  On prednisone taper/ sp tamiflu/cough meds  Not limited by breathing from desired activities   rec Aciphex 20 mg Take 30-60 min before first meal of the day also when coughing for any reason take Pepcid 20 mg one at bedtime and chlortrimeton 4  mg at bedtime Take delsym two tsp every 12 hours and supplement if needed with  tramadol 50 mg up to 2 every 4 hours to suppress the urge to cough.   Once you have eliminated the cough for 3 straight days try reducing the tramadol first,  then the delsym as tolerated.   GERD diet         05/07/2017 acute extended ov/[facility] re: cough > sob  Chief Complaint  Patient presents with   Acute Visit    Cough not improving, non prod "feels like something in my throat". She is waking up in the night coughing.   was doing fine for months since last ov with me but not doing well as well since 02/18/17 when placed on lisinopril and gradually  downhill since on multple courses of prednisone and now symbicort (though very poor hfa) and much worse dry cough 24/7 no longer has med calendar and doesn't even recognize the copy we have / very confused with meds  rec Stop lotensin and start valasartan 325 mg one daily  Start amlodipine 10 mg daily  Work on inhaler technique:  albuterol as a rescue medication to be used if you can't catch your breath For cough use the flutter valve and Take delsym two tsp every 12 hours and supplement if needed with  tramadol 50 mg up to 2 every 4 hours to suppress the urge to  cough   Finish your prednisone Follow the med calendar daily and bring it back with you to all office visits   Surgery 09/23/17 [name] / [facility]:  - ANGIOKERATOMA.  10/21/2017  f/u ov/Adarius Tigges re:  Sarcoid/ steroid dep / ocular involvmemnt  Chief Complaint  Patient presents with   Follow-up    Increased cough x 2 wks- non prod. Her breathing is unchanged. She ran out of prednisone approx 2 wks ago and never had it refilled.   confused re details of care  ? 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 Airway involvement on pred 10 mg one half daily  Chief Complaint  Patient presents with   Follow-up    Breathing is overall doing well and she denies any new co's today.   Dyspnea: not as active  Cough: none  Sleeping: 30 degrees fine  SABA use: none  02: none  rec Ok to try skipping a dose of prednisone  every other day to see what difference if any notice    05/07/2021  f/u ov/Samwise Eckardt re: sarcoid/ chemo for ovarian ca/ prednisone 10mg  one half qod  Chief Complaint  Patient presents with   Follow-up    Doing ok.  Dyspnea:  No change / very sedentary/ mostly just tired Cough: none  Sleeping: 30 degree bed  SABA use: none  02: none  Covid status:   vax x 3 /  Plus had covid  Rec Ok to change prednisone to 10 mg  One half daily until thru chemo then try again to reduce the dose  To one every other day   10/31/2021  f/u ov/Vahan Wadsworth re: sarcoid  maint on off prednisone per Dr Alvy Bimler  x 2 months Chief Complaint  Patient presents with   Sarcoidosis   Dyspnea:  fatigue not sob doing gym track x 5 min then stop every 5 min due to tired / also doing eliptical x 10 min s stopping  Cough: none  Sleeping: bed 30 degrees  SABA use: confused between dulera and proair  02: none  Covid status:   covid x 3 vax / Jan 2021 infected    No obvious day to day or daytime variability or assoc excess/ purulent sputum or mucus plugs or hemoptysis or cp or chest tightness, subjective wheeze or overt sinus or hb symptoms.   Sleeping  without nocturnal  or early am exacerbation  of respiratory  c/o's or need for noct saba. Also denies any obvious fluctuation of symptoms with weather or environmental changes or other aggravating or alleviating factors except as outlined above   No unusual exposure hx or h/o childhood pna/ asthma or knowledge  of premature birth.  Current Allergies, Complete Past Medical History, Past Surgical History, Family History, and Social History were reviewed in [employer] record.  ROS  The following are not active complaints unless bolded Hoarseness, sore throat, dysphagia, dental problems, itching, sneezing,  nasal congestion or discharge of excess mucus or purulent secretions, ear ache,   fever,  chills, sweats, unintended wt loss or wt gain, classically pleuritic or exertional cp,  orthopnea pnd or arm/hand swelling  or leg swelling, presyncope, palpitations, abdominal pain, anorexia, nausea, vomiting, diarrhea  or change in bowel habits or change in bladder habits, change in stools or change in urine, dysuria, hematuria,  rash, arthralgias, visual complaints, headache, numbness, weakness or ataxia or problems with walking or coordination,  change in mood or  memory.        Current Meds  Medication Sig   albuterol (VENTOLIN HFA) 108 (90 Base) MCG/ACT inhaler INHALE 2 PUFFS INTO THE LUNGS EVERY 6 HOURS AS NEEDED FOR WHEEZING OR SHORTNESS OF BREATH (NEED MD APPOINTMENT)   amLODipine (NORVASC) 10 MG tablet    bisoprolol (ZEBETA) 10 MG tablet Take 1 tablet (10 mg total) by mouth in the morning and at bedtime.   budesonide-formoterol (SYMBICORT) 80-4.5 MCG/ACT inhaler Inhale 2 puffs into the lungs 2 (two) times daily.   desonide (DESOWEN) 0.05 % lotion Apply topically 2 (two) times daily. Continue following with dermatologist   dextromethorphan (DELSYM) 30 MG/5ML liquid Take by mouth as needed for cough.    hydrochlorothiazide (HYDRODIURIL) 25 MG tablet Take 25 mg by mouth daily.   lenvatinib 20 mg daily dose (LENVIMA) 2 x 10 MG capsule Take 1 capsule (10 mg total) by mouth daily.   levothyroxine (SYNTHROID) 50 MCG tablet Take 1 tablet (50 mcg total) by mouth daily before breakfast.   lidocaine-prilocaine (EMLA) cream Apply 1 application topically as needed.   loratadine (CLARITIN) 10 MG  tablet Take 10 mg by mouth daily as needed for allergies.   neomycin-polymyxin-hydrocortisone (CORTISPORIN) OTIC solution Place 3 drops into the left ear 4 (four) times daily.   nystatin (MYCOSTATIN/NYSTOP) powder Apply to affected area 2 (two) times daily.   ondansetron (ZOFRAN) 8 MG tablet Take 1 tablet (8 mg total) by mouth every 8 (eight) hours as needed for nausea.   pantoprazole (PROTONIX) 40 MG tablet Take 1 tablet (40 mg total) by mouth daily. Patient needs appt for further refills.   polyethylene glycol (MIRALAX / GLYCOLAX) 17 g packet Take 17 g by mouth daily.   prochlorperazine (COMPAZINE) 10 MG tablet TAKE 1 TABLET BY MOUTH EVERY 6 HOURS AS NEEDED FOR NAUSEA OR VOMITING   RABEprazole (ACIPHEX) 20 MG tablet Take 1 tablet (20 mg total) by mouth daily before breakfast.   spironolactone (ALDACTONE) 25 MG tablet Take 1 tablet by mouth daily.   vitamin C (ASCORBIC ACID) 500 MG tablet Take 500 mg by mouth daily.                         Objective:   Physical Exam   10/31/2021    211   05/07/2021     218 11/07/2020    221 09/30/2019    233  06/29/2019      231 01/25/2019      228   01/14/2019      229  10/23/2018      229  09/17/2018      227  06/16/2018        225  10/21/2017      224  07/07/2017      223  05/22/2017         219  05/07/2017       222  08/22/2016         216   05/17/2016        215   05/02/15 219  lb (99.338 kg)  04/07/15 219 lb 9.6 oz (99.61 kg)  03/24/15 216 lb (97.977 kg)       Vital signs reviewed  10/31/2021  - Note at rest 02 sats  94% on RA   General appearance:    obese bf very easily confused re details of care     HEENT : pt wearing mask not removed for exam due to covid -19 concerns.    NECK :  without JVD/Nodes/TM/ nl carotid upstrokes bilaterally   LUNGS: no acc muscle use,  Nl contour chest with distant BS bilaterally without cough on insp or exp maneuvers   CV:  RRR  no s3 or murmur or increase in P2, and no edema   ABD:  soft and  nontender with nl inspiratory excursion in the supine position. No bruits or organomegaly appreciated, bowel sounds nl  MS:  Nl gait/ ext warm without deformities, calf tenderness, cyanosis or clubbing No obvious joint restrictions   SKIN: warm and dry without lesions    NEURO:  alert, approp, nl sensorium with  no motor or cerebellar deficits apparent.      I personally reviewed images and agree with radiology impression as follows:   Chest CT with contrast 07/25/21 Mediastinum/Lymph Nodes: Partially calcified mild mediastinal and bilateral hilar lymphadenopathy remain stable since prior study. No new or increased sites of lymphadenopathy identified.   Lungs/Pleura: Bilateral pleural-parenchymal scarring remains stable. No suspicious pulmonary nodules or masses identified. No evidence of infiltrate or pleural effusion.     Assessment & Plan:

## 2021-10-31 NOTE — Assessment & Plan Note (Addendum)
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 05/02/2015 >>> - flare May 2017 p stopped aciphex > rec restart 05/17/2016  - flare since 02/2017 placed on lisinopril with no airflow obst 05/07/2017 by spirometry > d/c ACEi  Permanently  - improved 07/07/2017 but not on gerd rx > rec resume full gerd rx/ stay off acei  - 06/29/2019  After extensive coaching inhaler device,  effectiveness =    75% short ti but can't afford dulera 100 so change to symb 80> cough resolved as of 09/30/2019   Not clear how much if any asthma is present but note now off pred x months s flare while on dulera 100 "prn" which I think is reasonable Based on two studies from NEJM  378; 20 p 1865 (2018) and 380 : p2020-30 (2019) in pts with mild asthma it is reasonable to use low dose symbicort eg 80 (and by inference dulera 100) 2bid "prn" flare in this setting but I emphasized this was only shown with symbicort and takes advantage of the rapid onset of action but is not the same as "rescue therapy" but can be stopped once the acute symptoms have resolved and the need for rescue has been minimized (< 2 x weekly)     - The proper method of use, as well as anticipated side effects, of a metered-dose inhaler were discussed and demonstrated to the patient using teach back method. Improved effectiveness after extensive coaching during this visit to a level of approximately 75 % from a baseline of 25 % > continue dulera hfa 100 assuming insurance now covering  Advised:  formulary restrictions will be an ongoing challenge for the forseable future and I would be happy to pick an alternative if the pt will first  provide me a list of them -  pt  will need to return here for training for any new device that is required eg dpi vs hfa vs respimat.

## 2021-11-02 ENCOUNTER — Other Ambulatory Visit (HOSPITAL_COMMUNITY): Payer: Self-pay

## 2021-11-03 ENCOUNTER — Other Ambulatory Visit (HOSPITAL_COMMUNITY): Payer: Self-pay

## 2021-11-07 ENCOUNTER — Ambulatory Visit: Payer: Medicare HMO | Admitting: Internal Medicine

## 2021-11-12 ENCOUNTER — Other Ambulatory Visit (HOSPITAL_COMMUNITY): Payer: Self-pay

## 2021-11-12 ENCOUNTER — Inpatient Hospital Stay: Payer: Medicare HMO

## 2021-11-12 ENCOUNTER — Ambulatory Visit (HOSPITAL_COMMUNITY)
Admission: RE | Admit: 2021-11-12 | Discharge: 2021-11-12 | Disposition: A | Discharge status: Home / Self Care | Payer: Medicare HMO | Source: Ambulatory Visit | Attending: Hematology and Oncology | Admitting: Hematology and Oncology

## 2021-11-12 ENCOUNTER — Other Ambulatory Visit: Payer: Self-pay | Admitting: Hematology and Oncology

## 2021-11-12 ENCOUNTER — Ambulatory Visit: Payer: Medicare HMO

## 2021-11-12 ENCOUNTER — Encounter: Payer: Self-pay | Admitting: Hematology and Oncology

## 2021-11-12 ENCOUNTER — Inpatient Hospital Stay (HOSPITAL_BASED_OUTPATIENT_CLINIC_OR_DEPARTMENT_OTHER): Payer: Medicare HMO | Admitting: Hematology and Oncology

## 2021-11-12 ENCOUNTER — Other Ambulatory Visit: Payer: Self-pay

## 2021-11-12 ENCOUNTER — Telehealth: Payer: Self-pay | Admitting: *Deleted

## 2021-11-12 ENCOUNTER — Other Ambulatory Visit: Payer: Self-pay | Admitting: *Deleted

## 2021-11-12 VITALS — BP 140/85 | HR 89 | Temp 99.4°F | Resp 18 | Ht 65.0 in | Wt 206.8 lb

## 2021-11-12 DIAGNOSIS — C541 Malignant neoplasm of endometrium: Secondary | ICD-10-CM

## 2021-11-12 DIAGNOSIS — C775 Secondary and unspecified malignant neoplasm of intrapelvic lymph nodes: Secondary | ICD-10-CM

## 2021-11-12 DIAGNOSIS — E860 Dehydration: Secondary | ICD-10-CM

## 2021-11-12 DIAGNOSIS — C7802 Secondary malignant neoplasm of left lung: Secondary | ICD-10-CM | POA: Diagnosis not present

## 2021-11-12 DIAGNOSIS — R509 Fever, unspecified: Secondary | ICD-10-CM | POA: Diagnosis not present

## 2021-11-12 DIAGNOSIS — Z79899 Other long term (current) drug therapy: Secondary | ICD-10-CM | POA: Diagnosis not present

## 2021-11-12 DIAGNOSIS — Z5112 Encounter for antineoplastic immunotherapy: Secondary | ICD-10-CM | POA: Diagnosis not present

## 2021-11-12 DIAGNOSIS — R5081 Fever presenting with conditions classified elsewhere: Secondary | ICD-10-CM

## 2021-11-12 LAB — CMP (CANCER CENTER ONLY)
ALT: 37 U/L (ref 0–44)
AST: 39 U/L (ref 15–41)
Albumin: 3.5 g/dL (ref 3.5–5.0)
Alkaline Phosphatase: 100 U/L (ref 38–126)
Anion gap: 12 (ref 5–15)
BUN: 18 mg/dL (ref 8–23)
CO2: 23 mmol/L (ref 22–32)
Calcium: 8.9 mg/dL (ref 8.9–10.3)
Chloride: 105 mmol/L (ref 98–111)
Creatinine: 1.09 mg/dL — ABNORMAL HIGH (ref 0.44–1.00)
GFR, Estimated: 57 mL/min — ABNORMAL LOW (ref >60)
Glucose, Bld: 112 mg/dL — ABNORMAL HIGH (ref 70–99)
Potassium: 4 mmol/L (ref 3.5–5.1)
Sodium: 140 mmol/L (ref 135–145)
Total Bilirubin: 0.8 mg/dL (ref 0.3–1.2)
Total Protein: 7.8 g/dL (ref 6.5–8.1)

## 2021-11-12 LAB — CBC WITH DIFFERENTIAL (CANCER CENTER ONLY)
Abs Immature Granulocytes: 0.02 10*3/uL (ref 0.00–0.07)
Basophils Absolute: 0 10*3/uL (ref 0.0–0.1)
Basophils Relative: 0 %
Eosinophils Absolute: 0.1 10*3/uL (ref 0.0–0.5)
Eosinophils Relative: 3 %
HCT: 31.4 % — ABNORMAL LOW (ref 36.0–46.0)
Hemoglobin: 10.8 g/dL — ABNORMAL LOW (ref 12.0–15.0)
Immature Granulocytes: 0 %
Lymphocytes Relative: 28 %
Lymphs Abs: 1.4 10*3/uL (ref 0.7–4.0)
MCH: 32.8 pg (ref 26.0–34.0)
MCHC: 34.4 g/dL (ref 30.0–36.0)
MCV: 95.4 fL (ref 80.0–100.0)
Monocytes Absolute: 0.7 10*3/uL (ref 0.1–1.0)
Monocytes Relative: 14 %
Neutro Abs: 2.7 10*3/uL (ref 1.7–7.7)
Neutrophils Relative %: 55 %
Platelet Count: 145 10*3/uL — ABNORMAL LOW (ref 150–400)
RBC: 3.29 MIL/uL — ABNORMAL LOW (ref 3.87–5.11)
RDW: 15.8 % — ABNORMAL HIGH (ref 11.5–15.5)
WBC Count: 4.9 10*3/uL (ref 4.0–10.5)
nRBC: 0 % (ref 0.0–0.2)

## 2021-11-12 LAB — TSH: TSH: 10.477 u[IU]/mL — ABNORMAL HIGH (ref 0.308–3.960)

## 2021-11-12 LAB — TOTAL PROTEIN, URINE DIPSTICK: Protein, ur: 30 mg/dL — AB

## 2021-11-12 MED ORDER — LEVOTHYROXINE SODIUM 75 MCG PO TABS
75.0000 ug | ORAL_TABLET | Freq: Every day | ORAL | 3 refills | Status: DC
  Filled 2021-11-12: qty 30, 30d supply, fill #0

## 2021-11-12 MED ORDER — DOXYCYCLINE HYCLATE 100 MG PO TABS
100.0000 mg | ORAL_TABLET | Freq: Two times a day (BID) | ORAL | 0 refills | Status: DC
  Filled 2021-11-12: qty 14, 7d supply, fill #0

## 2021-11-12 MED ORDER — SODIUM CHLORIDE 0.9% FLUSH: 10.0000 mL | INTRAVENOUS | Status: DC | PRN

## 2021-11-12 MED ORDER — HEPARIN SOD (PORK) LOCK FLUSH 100 UNIT/ML IV SOLN: 500.0000 [IU] | Freq: Once | INTRAVENOUS | Status: DC

## 2021-11-12 MED ORDER — SODIUM CHLORIDE 0.9% FLUSH
10.0000 mL | Freq: Once | INTRAVENOUS | Status: AC
  Administered 2021-11-12: 09:00:00 10 mL

## 2021-11-12 MED ORDER — VALACYCLOVIR HCL 1 G PO TABS
1000.0000 mg | ORAL_TABLET | Freq: Three times a day (TID) | ORAL | 0 refills | Status: DC
  Filled 2021-11-12: qty 21, 7d supply, fill #0

## 2021-11-12 NOTE — Assessment & Plan Note (Signed)
She had recent fever and chills and cold sore, all indicative of upper respiratory tract infection I will order urine culture, blood culture, chest x-ray and hold treatment I will prescribe a course of doxycycline as well as Valtrex for herpes simplex I will call the patient tomorrow for further assessment I instructed patient to perform COVID testing at home

## 2021-11-12 NOTE — Assessment & Plan Note (Signed)
She has lost weight and have clinical signs of dehydration I recommend the patient to hold her diuretic therapy and increase oral fluid intake

## 2021-11-12 NOTE — Progress Notes (Signed)
[facility] OFFICE PROGRESS NOTE  Patient Care Team: [name], [name], MD as PCP - General (Family Medicine)  ASSESSMENT & PLAN:  Endometrial cancer ([place]) Overall, I am very concerned about her wellbeing Her poor appetite and weight loss could be due to malignancy Her sensation of sore throat, recent chills could be indicated from infection I will order urine culture, blood culture, chest x-ray and recommend COVID testing at home as well as CT imaging next week for objective assessment of response to therapy We will hold treatment today and reschedule appointment next week pending further test results We will focus on supportive care today  Fever and chills She had recent fever and chills and cold sore, all indicative of upper respiratory tract infection I will order urine culture, blood culture, chest x-ray and hold treatment I will prescribe a course of doxycycline as well as Valtrex for herpes simplex I will call the patient tomorrow for further assessment I instructed patient to perform COVID testing at home  Dehydration She has lost weight and have clinical signs of dehydration I recommend the patient to hold her diuretic therapy and increase oral fluid intake  Orders Placed This Encounter  Procedures   Culture, blood (single) w Reflex to ID Panel    Standing Status:   Future    Number of Occurrences:   1    Standing Expiration Date:   11/12/2022   Urine Culture    Standing Status:   Future    Number of Occurrences:   1    Standing Expiration Date:   11/12/2022   DG Chest 2 View    Standing Status:   Future    Number of Occurrences:   1    Standing Expiration Date:   11/12/2022    Order Specific Question:   Reason for exam:    Answer:   fever, chills    Order Specific Question:   Preferred imaging location?    Answer:   [facility]   CT CHEST ABDOMEN PELVIS W CONTRAST    Standing Status:   Future    Standing Expiration Date:   11/12/2022     Order Specific Question:   Preferred imaging location?    Answer:   [facility]    Order Specific Question:   Radiology Contrast Protocol - do NOT remove file path    Answer:   [email or web address]   Urinalysis, Complete w Microscopic    Standing Status:   Future    Standing Expiration Date:   11/12/2022    All questions were answered. The patient knows to call the clinic with any problems, questions or concerns. The total time spent in the appointment was 40 minutes encounter with patients including review of chart and various tests results, discussions about plan of care and coordination of care plan   [name], MD 11/12/2021 10:47 AM  INTERVAL HISTORY: Please see below for problem oriented charting. Last week before Thanksgiving, she has started to feel unwell She had fever and chills as well as sensation of sore throat She also have cold sore developing her leg Her intake was very poor and she has lost some weight She did not seek any medical attention Denies nausea, changes in bowel habits or new onset of pain No recent cough  REVIEW OF SYSTEMS:   Eyes: Denies blurriness of vision Respiratory: Denies cough, dyspnea or wheezes Cardiovascular: Denies palpitation, chest discomfort or lower extremity swelling Gastrointestinal:  Denies nausea, heartburn or  change in bowel habits Skin: Denies abnormal skin rashes Lymphatics: Denies new lymphadenopathy or easy bruising Neurological:Denies numbness, tingling or new weaknesses Behavioral/Psych: Mood is stable, no new changes  All other systems were reviewed with the patient and are negative.  I have reviewed the past medical history, past surgical history, social history and family history with the patient and they are unchanged from previous note.  ALLERGIES:  is allergic to shellfish allergy and penicillins.  MEDICATIONS:  Current Outpatient Medications  Medication Sig Dispense  Refill   doxycycline (VIBRA-TABS) 100 MG tablet Take 1 tablet (100 mg total) by mouth 2 (two) times daily. 14 tablet 0   valACYclovir (VALTREX) 1000 MG tablet Take 1 tablet (1,000 mg total) by mouth 3 (three) times daily. 21 tablet 0   albuterol (PROAIR HFA) 108 (90 Base) MCG/ACT inhaler 2 puffs every 4 hours as needed only  if your can't catch your breath 18 g 2   amLODipine (NORVASC) 10 MG tablet      bisoprolol (ZEBETA) 10 MG tablet Take 1 tablet (10 mg total) by mouth in the morning and at bedtime. 180 tablet 0   desonide (DESOWEN) 0.05 % lotion Apply topically 2 (two) times daily. Continue following with dermatologist 59 mL 0   dextromethorphan (DELSYM) 30 MG/5ML liquid Take by mouth as needed for cough.      hydrochlorothiazide (HYDRODIURIL) 25 MG tablet Take 25 mg by mouth daily.     lenvatinib 20 mg daily dose (LENVIMA) 2 x 10 MG capsule Take 1 capsule (10 mg total) by mouth daily. 30 capsule 11   levothyroxine (SYNTHROID) 75 MCG tablet Take 1 tablet (75 mcg total) by mouth daily before breakfast. 30 tablet 3   lidocaine-prilocaine (EMLA) cream Apply 1 application topically as needed. 30 g 3   loratadine (CLARITIN) 10 MG tablet Take 10 mg by mouth daily as needed for allergies.     mometasone-formoterol (DULERA) 100-5 MCG/ACT AERO Take 2 puffs first thing in am and then another 2 puffs about 12 hours later. 3 each 3   neomycin-polymyxin-hydrocortisone (CORTISPORIN) OTIC solution Place 3 drops into the left ear 4 (four) times daily. 10 mL 0   nystatin (MYCOSTATIN/NYSTOP) powder Apply to affected area 2 (two) times daily. 60 g 0   ondansetron (ZOFRAN) 8 MG tablet Take 1 tablet (8 mg total) by mouth every 8 (eight) hours as needed for nausea. 30 tablet 3   pantoprazole (PROTONIX) 40 MG tablet Take 1 tablet (40 mg total) by mouth daily. Patient needs appt for further refills. 90 tablet 0   polyethylene glycol (MIRALAX / GLYCOLAX) 17 g packet Take 17 g by mouth daily.     prochlorperazine  (COMPAZINE) 10 MG tablet TAKE 1 TABLET BY MOUTH EVERY 6 HOURS AS NEEDED FOR NAUSEA OR VOMITING 60 tablet 3   RABEprazole (ACIPHEX) 20 MG tablet Take 1 tablet (20 mg total) by mouth daily before breakfast. 90 tablet 3   spironolactone (ALDACTONE) 25 MG tablet Take 1 tablet by mouth daily. 30 tablet 1   vitamin C (ASCORBIC ACID) 500 MG tablet Take 500 mg by mouth daily.     No current facility-administered medications for this visit.    SUMMARY OF ONCOLOGIC HISTORY: Oncology History Overview Note  ER/PR neg, MSI stable   Endometrial cancer ([place])  06/15/2018 Surgery   Surgeon: Donaciano Eva     Operation: Robotic-assisted laparoscopic total hysterectomy with bilateral salpingoophorectomy, SLN biopsy     Operative Findings:  : 6cm uterus, normal  tubes and ovaries, omentum adherent to the anterior abdominal wall and uterine fundus. No suspicious nodes.   06/15/2018 Pathology Results   Procedure: Total hysterectomy with bilateral salpingo-oophorectomy. Bilateral obturator sentinel lymph node biopsies. Histologic type: Endometrioid adenocarcinoma. Histologic Grade: FIGO grade 1. Myometrial invasion: Depth of invasion: 15 mm Myometrial thickness: 20 mm Uterine Serosa Involvement: Not identified. Cervical stromal involvement: Not identified. Extent of involvement of other organs: Uninvolved. Lymphovascular invasion: Present. Regional Lymph Nodes: Examined: 2 Sentinel 0 Non-sentinel 2 Total Lymph nodes with metastasis: 0 Isolated tumor cells (< 0.2 mm): 2 Micrometastasis: (> 0.2 mm and < 2.0 mm): 0 Macrometastasis: (> 2.0 mm): 0 Extracapsular extension: N/A. Tumor block for ancillary studies: 4Q-S. MMR / MSI testing: Will be ordered. Pathologic Stage Classification (pTNM, AJCC 8th edition): pT1b, pN0(i+)   07/20/2018 Imaging   1. Surgical changes from recent hysterectomy. No findings suspicious for residual tumor or adenopathy in the pelvis. 2. No CT findings to suggest omental,  peritoneal surface or solid organ metastatic disease in the abdomen. 3. Changes consistent with known sarcoidosis in the chest with bulky calcified mediastinal and hilar lymph nodes and interstitial lung disease. I do not see any definite CT findings to suggest metastatic disease involving the chest.   08/19/2018 - 09/17/2018 Radiation Therapy   08/19/18 - 09/17/18: Vaginal Cuff, 3 cm cylinder with treatment length of 3 cm / 30 Gy delivered in 5 fractions of 6 Gy, Brachytherapy / HDR, Iridium-192   05/2005: Left Chest Wall keloid   04/2004 - 05/2004: Anus, pelvis /inguinal ( Dr. Tammi Klippel)   01/19/2020 Imaging   1. No definite CT findings of the abdomen or pelvis to explain pain. 2. The sigmoid colon and rectum are decompressed although slightly thickened appearing, suggestive of nonspecific infectious, inflammatory, or ischemic colitis. Correlate for referable clinical symptoms. 3. There is trace, nonspecific free fluid in the low pelvis, which may be reactive, although would be difficult to distinguish from postoperative and post treatment soft tissue change in the pelvis given history of hysterectomy and rectal cancer. 4. Unchanged prominent retroperitoneal and iliac lymph nodes. No new findings suspicious for metastatic disease in the abdomen or pelvis. 5. Bibasilar pulmonary fibrosis and mediastinal and hilar lymphadenopathy, better evaluated by prior CT chest and consistent with patient diagnosis of sarcoidosis.       04/18/2020 Imaging   Venous Doppler Left:  - No evidence of deep vein thrombosis seen in the left lower extremity, from the common femoral through the popliteal veins.  - No evidence of superficial venous thrombosis in the left lower extremity.  - There is no evidence of venous reflux seen in the left lower extremity.  - Rouleax flow noted in the superficial system.    05/18/2020 PET scan   1. Complex scan with a combination of hypermetabolic granulomatous disease (sarcoidosis) as well  as hypermetabolic metastatic endometrial carcinoma. 2. Hypermetabolic mediastinal hilar lymph nodes and pulmonary nodularity with bronchovascular thickening is consistent with sarcoidosis. 3. Solitary hypermetabolic nodule in the medial LEFT lower lobe, new from prior with intense metabolic activity, is concerning for a metastatic deposit to the LEFT lower lobe. 4. Ill-defined hypermetabolic soft tissue along the LEFT operator space is most consistent with local recurrence of endometrial carcinoma. There is entrapment of the LEFT ureter by this hypermetabolic soft tissue with mild hydroureter and hydronephrosis on the LEFT. 5. Additionally there appears to be direct extension from the hypermetabolic left operator space soft tissue to the sigmoid colon with there is a well-defined  hypermetabolic lesion in the colon. 6. More distant hypermetabolic peritoneal metastasis along the ventral peritoneal surface of the RIGHT abdomen beneath RIGHT rectus abdominus muscle. 7. Hypermetabolic lymph nodes in the porta hepatis are favored related to sarcoidosis.   05/19/2020 Cancer Staging   Staging form: Corpus Uteri - Carcinoma and Carcinosarcoma, AJCC 8th Edition - Clinical stage from 05/19/2020: Stage IVB (rcT1, cN2a, cM1) - Signed by [name], MD on 05/19/2020    05/26/2020 Procedure   Successful placement of a right internal jugular approach power injectable Port-A-Cath. The catheter is ready for immediate use   05/29/2020 - 09/27/2020 Chemotherapy   The patient had carboplatin and taxol for chemotherapy treatment.     07/31/2020 Imaging   1. Asymmetric soft tissue mass extending from the left vaginal cuff has decreased in size in the interval. No residual peritoneal nodularity. Pulmonary metastases are stable with exception of a minimally improved left lower lobe nodule. 2. Stable soft tissue nodule associated with the right rectus abdominus muscle. 3. Interval resolution of left hydronephrosis. 4.  Calcified mediastinal and hilar lymph nodes, together with pulmonary parenchymal fibrosis, findings which are in keeping with patient's known diagnosis of sarcoid. 5. Aortic atherosclerosis (ICD10-I70.0). Coronary artery calcification.   10/27/2020 Imaging   1. Essentially stable appearance of the soft tissue density nodule posteriorly in the left rectus abdominus muscle, currently 1.8 by 1.2 cm, previously 11 mm. 2. Stable calcified adenopathy in the mediastinum and hila, compatible with prior granulomatous disease. 3. Stable small right lower lobe pulmonary nodules. 4. Stable soft tissue density prominence along the left vaginal cuff extending up towards the adnexa, probably therapy related. 5. Other imaging findings of potential clinical significance: Coronary atherosclerosis. Small type 1 hiatal hernia. Stable subpleural reticulation favoring the lung bases, possibly from mild fibrosis. 6. Aortic atherosclerosis.   01/19/2021 Imaging   1. Status post hysterectomy. Interval enlargement of soft tissue adjacent to the left aspect of the vaginal cuff and bladder, measuring approximately 4.3 x 2.9 cm in axial section, previously previously previously 3.7 x 1.8 cm when measured similarly. Extensive soft tissue thickening and stranding throughout the low pelvis. 2. Interval enlargement of a metastatic nodule within the right rectus abdominus. New small peritoneal nodule anterior to the sigmoid colon. 3. Interval enlargement of retroperitoneal retroperitoneal lymph nodes. 4. Multiple small bilateral pulmonary nodules, several of which are new and enlarged. 5. Constellation of findings above is consistent with worsened metastatic disease. 6. New left hydronephrosis and hydroureter, the distal left ureter obstructed by soft tissue near the left aspect of the urinary bladder and vaginal cuff. 7. Unchanged enlarged, partially calcified mediastinal and hilar lymph nodes,, in keeping with prior granulomatous  infection or sarcoidosis. 8. Mild underlying pulmonary fibrosis, in a UIP pattern. 9. Coronary artery disease.   02/02/2021 - 07/13/2021 Chemotherapy    Patient is on carboplatin and docetaxel    04/17/2021 Imaging   1. Interval decrease in size of multiple pulmonary nodules, with resolution of some previously seen nodules. 2. Interval decrease in size of enlarged retroperitoneal lymph nodes. 3. Interval decrease in size of soft tissue at the left aspect of the vaginal cuff status post hysterectomy. 4. Interval decrease in size of a soft tissue nodule within the posterior aspect of the right rectus abdominus muscle body. 5. Constellation of findings is consistent with treatment response of locally recurrent, nodal, and distant metastatic disease. 6. There has been interval resolution of previously seen left hydronephrosis and hydroureter. 7. Redemonstrated mild pulmonary fibrosis in a  UIP pattern   07/26/2021 Imaging   No new or progressive disease within the chest, abdomen, or pelvis.   Stable mild partially calcified mediastinal and bilateral hilar lymphadenopathy.   Stable sub-cm abdominal retroperitoneal lymph nodes.   Aortic Atherosclerosis (ICD10-I70.0).     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     02/02/2021 - 07/13/2021 Chemotherapy    Patient is on carboplatin and docetaxel    09/03/2021 -  Chemotherapy   Patient is on Treatment Plan : UTERINE Lenvatinib + Pembrolizumab q21d       PHYSICAL EXAMINATION: ECOG PERFORMANCE STATUS: 2 - Symptomatic, <50% confined to bed  Vitals:   11/12/21 0851  BP: 140/85  Pulse: 89  Resp: 18  Temp: 99.4 F (37.4 C)  SpO2: 97%   Filed Weights   11/12/21 0851  Weight: 206 lb 12.8 oz (93.8 kg)    GENERAL:alert, no distress and comfortable SKIN: She has herpes simplex on the right side of her lip EYES: normal, Conjunctiva are pink and non-injected, sclera clear OROPHARYNX:no exudate, no erythema and lips, buccal mucosa, and tongue normal.  Dry mucous membrane is noted NECK: supple, thyroid normal size, non-tender, without nodularity LYMPH:  no palpable lymphadenopathy in the cervical, axillary or inguinal LUNGS: Noted bilateral wheezes at the bottom of her lung bases  HEART: regular rate & rhythm and no murmurs and no lower extremity edema ABDOMEN:abdomen soft, non-tender and normal bowel sounds Musculoskeletal:no cyanosis of digits and no clubbing  NEURO: alert & oriented x 3 with fluent speech, no focal motor/sensory deficits  LABORATORY DATA:  I have reviewed the data as listed    Component Value Date/Time   NA 140 11/12/2021 0831   K 4.0 11/12/2021 0831   CL 105 11/12/2021 0831   CO2 23 11/12/2021 0831   GLUCOSE 112 (H) 11/12/2021 0831   BUN 18 11/12/2021 0831   CREATININE 1.09 (H) 11/12/2021 0831   CALCIUM 8.9 11/12/2021 0831   PROT 7.8 11/12/2021 0831   ALBUMIN 3.5 11/12/2021 0831   AST 39 11/12/2021 0831   ALT 37 11/12/2021 0831   ALKPHOS 100 11/12/2021 0831   BILITOT 0.8 11/12/2021 0831   GFRNONAA 57 (L) 11/12/2021 0831   GFRAA >60 09/08/2020 0819    No results found for: SPEP, UPEP  Lab Results  Component Value Date   WBC 4.9 11/12/2021    NEUTROABS 2.7 11/12/2021   HGB 10.8 (L) 11/12/2021   HCT 31.4 (L) 11/12/2021   MCV 95.4 11/12/2021   PLT 145 (L) 11/12/2021      Chemistry      Component Value Date/Time   NA 140 11/12/2021 0831   K 4.0 11/12/2021 0831   CL 105 11/12/2021 0831   CO2  23 11/12/2021 0831   BUN 18 11/12/2021 0831   CREATININE 1.09 (H) 11/12/2021 0831      Component Value Date/Time   CALCIUM 8.9 11/12/2021 0831   ALKPHOS 100 11/12/2021 0831   AST 39 11/12/2021 0831   ALT 37 11/12/2021 0831   BILITOT 0.8 11/12/2021 0831       RADIOGRAPHIC STUDIES: I have personally reviewed the radiological images as listed and agreed with the findings in the report. DG Chest 2 View  Result Date: 11/12/2021 CLINICAL DATA:  Fever and chills.  History of endometrial cancer. EXAM: CHEST - 2 VIEW COMPARISON:  07/26/2021 CT.  09/30/2019 chest radiograph. FINDINGS: Hyperinflation.  Right Port-A-Cath tip at high right atrium. Midline trachea. Normal heart size. Tortuous thoracic aorta. Soft tissue fullness in the hila is likely related to adenopathy when correlated with prior CT. No pleural effusion or pneumothorax. Clear lungs. IMPRESSION: No evidence of pneumonia or explanation for  fevers. Electronically Signed   By: Abigail Miyamoto M.D.   On: 11/12/2021 10:26

## 2021-11-12 NOTE — Assessment & Plan Note (Signed)
Overall, I am very concerned about her wellbeing Her poor appetite and weight loss could be due to malignancy Her sensation of sore throat, recent chills could be indicated from infection I will order urine culture, blood culture, chest x-ray and recommend COVID testing at home as well as CT imaging next week for objective assessment of response to therapy We will hold treatment today and reschedule appointment next week pending further test results We will focus on supportive care today

## 2021-11-12 NOTE — Telephone Encounter (Signed)
Patient contacted office to report she took Covid test at home as requested by Dr. Alvy Bimler today. Test was negative for Covid. Call routed to MD as Juluis Rainier

## 2021-11-13 ENCOUNTER — Other Ambulatory Visit: Payer: Self-pay | Admitting: Hematology and Oncology

## 2021-11-13 LAB — URINE CULTURE: Culture: 80000 — AB

## 2021-11-13 LAB — T4: T4, Total: 8.3 ug/dL (ref 4.5–12.0)

## 2021-11-13 NOTE — Telephone Encounter (Signed)
Her CXR is ok, blood cultures so far ok Are her symptoms better? She is supposed to schedule CT scan

## 2021-11-14 ENCOUNTER — Telehealth: Payer: Self-pay | Admitting: Oncology

## 2021-11-14 ENCOUNTER — Encounter: Payer: Self-pay | Admitting: Hematology and Oncology

## 2021-11-14 NOTE — Telephone Encounter (Signed)
[name] said she is feeling a little better and her Covid test was negative.  Advised her that her cultures have been negative per Dr. Christiana Pellant. Also notified her of CT appointment and instructions on 11/21/21 at 4:30. She verbalized understanding and agreement in teach back mode.

## 2021-11-14 NOTE — Telephone Encounter (Signed)
Left a message regarding scheduling a CT scan.  Requested a return call.

## 2021-11-16 ENCOUNTER — Encounter: Payer: Self-pay | Admitting: Hematology and Oncology

## 2021-11-17 LAB — CULTURE, BLOOD (SINGLE)
Culture: NO GROWTH
Special Requests: ADEQUATE

## 2021-11-21 ENCOUNTER — Ambulatory Visit (HOSPITAL_COMMUNITY)
Admission: RE | Admit: 2021-11-21 | Discharge: 2021-11-21 | Disposition: A | Discharge status: Home / Self Care | Payer: Medicare HMO | Source: Ambulatory Visit | Attending: Hematology and Oncology | Admitting: Hematology and Oncology

## 2021-11-21 ENCOUNTER — Other Ambulatory Visit: Payer: Self-pay

## 2021-11-21 DIAGNOSIS — C775 Secondary and unspecified malignant neoplasm of intrapelvic lymph nodes: Secondary | ICD-10-CM | POA: Diagnosis not present

## 2021-11-21 DIAGNOSIS — K449 Diaphragmatic hernia without obstruction or gangrene: Secondary | ICD-10-CM | POA: Diagnosis not present

## 2021-11-21 DIAGNOSIS — J479 Bronchiectasis, uncomplicated: Secondary | ICD-10-CM | POA: Diagnosis not present

## 2021-11-21 DIAGNOSIS — C541 Malignant neoplasm of endometrium: Secondary | ICD-10-CM | POA: Diagnosis not present

## 2021-11-21 DIAGNOSIS — D869 Sarcoidosis, unspecified: Secondary | ICD-10-CM | POA: Diagnosis not present

## 2021-11-21 DIAGNOSIS — C7802 Secondary malignant neoplasm of left lung: Secondary | ICD-10-CM | POA: Diagnosis not present

## 2021-11-21 DIAGNOSIS — I700 Atherosclerosis of aorta: Secondary | ICD-10-CM | POA: Diagnosis not present

## 2021-11-21 MED ORDER — HEPARIN SOD (PORK) LOCK FLUSH 100 UNIT/ML IV SOLN
500.0000 [IU] | Freq: Once | INTRAVENOUS | Status: AC
  Administered 2021-11-21: 500 [IU] via INTRAVENOUS

## 2021-11-21 MED ORDER — HEPARIN SOD (PORK) LOCK FLUSH 100 UNIT/ML IV SOLN
INTRAVENOUS | Status: AC
  Filled 2021-11-21: qty 5

## 2021-11-21 MED ORDER — SODIUM CHLORIDE (PF) 0.9 % IJ SOLN
INTRAMUSCULAR | Status: AC
  Filled 2021-11-21: qty 50

## 2021-11-21 MED ORDER — IOHEXOL 350 MG/ML SOLN
80.0000 mL | Freq: Once | INTRAVENOUS | Status: AC | PRN
  Administered 2021-11-21: 80 mL via INTRAVENOUS

## 2021-11-22 ENCOUNTER — Ambulatory Visit: Payer: Medicare HMO

## 2021-11-22 ENCOUNTER — Inpatient Hospital Stay: Payer: Medicare HMO | Attending: Hematology and Oncology | Admitting: Hematology and Oncology

## 2021-11-22 ENCOUNTER — Inpatient Hospital Stay: Payer: Medicare HMO

## 2021-11-22 ENCOUNTER — Telehealth: Payer: Self-pay

## 2021-11-22 ENCOUNTER — Other Ambulatory Visit (HOSPITAL_COMMUNITY): Payer: Self-pay

## 2021-11-22 DIAGNOSIS — C541 Malignant neoplasm of endometrium: Secondary | ICD-10-CM

## 2021-11-22 DIAGNOSIS — C7802 Secondary malignant neoplasm of left lung: Secondary | ICD-10-CM | POA: Insufficient documentation

## 2021-11-22 DIAGNOSIS — Z79899 Other long term (current) drug therapy: Secondary | ICD-10-CM | POA: Diagnosis not present

## 2021-11-22 DIAGNOSIS — T451X5A Adverse effect of antineoplastic and immunosuppressive drugs, initial encounter: Secondary | ICD-10-CM | POA: Diagnosis not present

## 2021-11-22 DIAGNOSIS — C775 Secondary and unspecified malignant neoplasm of intrapelvic lymph nodes: Secondary | ICD-10-CM | POA: Insufficient documentation

## 2021-11-22 DIAGNOSIS — Z5112 Encounter for antineoplastic immunotherapy: Secondary | ICD-10-CM | POA: Diagnosis not present

## 2021-11-22 DIAGNOSIS — D6481 Anemia due to antineoplastic chemotherapy: Secondary | ICD-10-CM | POA: Diagnosis not present

## 2021-11-22 DIAGNOSIS — R5081 Fever presenting with conditions classified elsewhere: Secondary | ICD-10-CM

## 2021-11-22 DIAGNOSIS — I10 Essential (primary) hypertension: Secondary | ICD-10-CM | POA: Diagnosis not present

## 2021-11-22 DIAGNOSIS — R638 Other symptoms and signs concerning food and fluid intake: Secondary | ICD-10-CM | POA: Insufficient documentation

## 2021-11-22 LAB — CBC WITH DIFFERENTIAL (CANCER CENTER ONLY)
Abs Immature Granulocytes: 0.02 10*3/uL (ref 0.00–0.07)
Basophils Absolute: 0 10*3/uL (ref 0.0–0.1)
Basophils Relative: 0 %
Eosinophils Absolute: 0.1 10*3/uL (ref 0.0–0.5)
Eosinophils Relative: 3 %
HCT: 30.5 % — ABNORMAL LOW (ref 36.0–46.0)
Hemoglobin: 10.2 g/dL — ABNORMAL LOW (ref 12.0–15.0)
Immature Granulocytes: 0 %
Lymphocytes Relative: 21 %
Lymphs Abs: 1 10*3/uL (ref 0.7–4.0)
MCH: 32.5 pg (ref 26.0–34.0)
MCHC: 33.4 g/dL (ref 30.0–36.0)
MCV: 97.1 fL (ref 80.0–100.0)
Monocytes Absolute: 0.6 10*3/uL (ref 0.1–1.0)
Monocytes Relative: 14 %
Neutro Abs: 2.9 10*3/uL (ref 1.7–7.7)
Neutrophils Relative %: 62 %
Platelet Count: 180 10*3/uL (ref 150–400)
RBC: 3.14 MIL/uL — ABNORMAL LOW (ref 3.87–5.11)
RDW: 16 % — ABNORMAL HIGH (ref 11.5–15.5)
WBC Count: 4.7 10*3/uL (ref 4.0–10.5)
nRBC: 0 % (ref 0.0–0.2)

## 2021-11-22 LAB — CMP (CANCER CENTER ONLY)
ALT: 21 U/L (ref 0–44)
AST: 21 U/L (ref 15–41)
Albumin: 3.6 g/dL (ref 3.5–5.0)
Alkaline Phosphatase: 92 U/L (ref 38–126)
Anion gap: 12 (ref 5–15)
BUN: 15 mg/dL (ref 8–23)
CO2: 23 mmol/L (ref 22–32)
Calcium: 9.3 mg/dL (ref 8.9–10.3)
Chloride: 107 mmol/L (ref 98–111)
Creatinine: 0.92 mg/dL (ref 0.44–1.00)
GFR, Estimated: >60 mL/min (ref >60)
Glucose, Bld: 102 mg/dL — ABNORMAL HIGH (ref 70–99)
Potassium: 3.7 mmol/L (ref 3.5–5.1)
Sodium: 142 mmol/L (ref 135–145)
Total Bilirubin: 0.7 mg/dL (ref 0.3–1.2)
Total Protein: 7.4 g/dL (ref 6.5–8.1)

## 2021-11-22 LAB — URINALYSIS, COMPLETE (UACMP) WITH MICROSCOPIC
Bilirubin Urine: NEGATIVE
Glucose, UA: NEGATIVE mg/dL
Hgb urine dipstick: NEGATIVE
Ketones, ur: 5 mg/dL — AB
Nitrite: NEGATIVE
Protein, ur: 30 mg/dL — AB
Specific Gravity, Urine: 1.033 — ABNORMAL HIGH (ref 1.005–1.030)
pH: 5 (ref 5.0–8.0)

## 2021-11-22 LAB — TSH: TSH: 2.65 u[IU]/mL (ref 0.308–3.960)

## 2021-11-22 MED ORDER — HEPARIN SOD (PORK) LOCK FLUSH 100 UNIT/ML IV SOLN
500.0000 [IU] | Freq: Once | INTRAVENOUS | Status: AC | PRN
  Administered 2021-11-22: 500 [IU]

## 2021-11-22 MED ORDER — HEPARIN SOD (PORK) LOCK FLUSH 100 UNIT/ML IV SOLN: 500.0000 [IU] | Freq: Once | INTRAVENOUS | Status: DC

## 2021-11-22 MED ORDER — TRIAMCINOLONE ACETONIDE 55 MCG/ACT NA AERO
2.0000 | INHALATION_SPRAY | Freq: Every day | NASAL | 12 refills | Status: DC
Start: 2021-11-22 — End: 2022-04-11
  Filled 2021-11-22: qty 1, 1d supply, fill #0

## 2021-11-22 MED ORDER — SODIUM CHLORIDE 0.9 % IV SOLN: Freq: Once | INTRAVENOUS | Status: AC

## 2021-11-22 MED ORDER — SODIUM CHLORIDE 0.9% FLUSH
10.0000 mL | Freq: Once | INTRAVENOUS | Status: AC
Start: 2021-11-22 — End: 2021-11-22
  Administered 2021-11-22: 10 mL

## 2021-11-22 MED ORDER — SODIUM CHLORIDE 0.9 % IV SOLN
200.0000 mg | Freq: Once | INTRAVENOUS | Status: AC
  Administered 2021-11-22: 200 mg via INTRAVENOUS
  Filled 2021-11-22: qty 8

## 2021-11-22 MED ORDER — SODIUM CHLORIDE 0.9% FLUSH
10.0000 mL | INTRAVENOUS | Status: DC | PRN
  Administered 2021-11-22: 10 mL

## 2021-11-22 NOTE — Telephone Encounter (Signed)
Called per Dr. Alvy Bimler, CT scan looks good. She verbalized understanding and appreciated the call.

## 2021-11-22 NOTE — Patient Instructions (Signed)
[facility]  Discharge Instructions: ?Thank you for choosing [facility] to provide your oncology and hematology care.  ? ?If you have a lab appointment with the Cancer Center, please go directly to the Cancer Center and check in at the registration area. ?  ?Wear comfortable clothing and clothing appropriate for easy access to any Portacath or PICC line.  ? ?We strive to give you quality time with your provider. You may need to reschedule your appointment if you arrive late (15 or more minutes).  Arriving late affects you and other patients whose appointments are after yours.  Also, if you miss three or more appointments without notifying the office, you may be dismissed from the clinic at the provider?s discretion.    ?  ?For prescription refill requests, have your pharmacy contact our office and allow 72 hours for refills to be completed.   ? ?Today you received the following chemotherapy and/or immunotherapy agents: Keytruda ?  ?To help prevent nausea and vomiting after your treatment, we encourage you to take your nausea medication as directed. ? ?BELOW ARE SYMPTOMS THAT SHOULD BE REPORTED IMMEDIATELY: ?*FEVER GREATER THAN 100.4 F (38 ?C) OR HIGHER ?*CHILLS OR SWEATING ?*NAUSEA AND VOMITING THAT IS NOT CONTROLLED WITH YOUR NAUSEA MEDICATION ?*UNUSUAL SHORTNESS OF BREATH ?*UNUSUAL BRUISING OR BLEEDING ?*URINARY PROBLEMS (pain or burning when urinating, or frequent urination) ?*BOWEL PROBLEMS (unusual diarrhea, constipation, pain near the anus) ?TENDERNESS IN MOUTH AND THROAT WITH OR WITHOUT PRESENCE OF ULCERS (sore throat, sores in mouth, or a toothache) ?UNUSUAL RASH, SWELLING OR PAIN  ?UNUSUAL VAGINAL DISCHARGE OR ITCHING  ? ?Items with * indicate a potential emergency and should be followed up as soon as possible or go to the Emergency Department if any problems should occur. ? ?Please show the CHEMOTHERAPY ALERT CARD or IMMUNOTHERAPY ALERT CARD at check-in to the  Emergency Department and triage nurse. ? ?Should you have questions after your visit or need to cancel or reschedule your appointment, please contact [facility]  Dept: [phone]  and follow the prompts.  Office hours are 8:00 a.m. to 4:30 p.m. Monday - Friday. Please note that voicemails left after 4:00 p.m. may not be returned until the following business day.  We are closed weekends and major holidays. You have access to a nurse at all times for urgent questions. Please call the main number to the clinic Dept: [phone] and follow the prompts. ? ? ?For any non-urgent questions, you may also contact your provider using MyChart. We now offer e-Visits for anyone 18 and older to request care online for non-urgent symptoms. For details visit [email or web address]. ?  ?Also download the MyChart app! Go to the app store, search "MyChart", open the app, select [facility], and log in with your MyChart username and password. ? ?Due to Covid, a mask is required upon entering the hospital/clinic. If you do not have a mask, one will be given to you upon arrival. For doctor visits, patients may have 1 support person aged 18 or older with them. For treatment visits, patients cannot have anyone with them due to current Covid guidelines and our immunocompromised population.  ? ?

## 2021-11-23 ENCOUNTER — Encounter: Payer: Self-pay | Admitting: Hematology and Oncology

## 2021-11-23 ENCOUNTER — Ambulatory Visit: Payer: Medicare HMO

## 2021-11-23 LAB — T4: T4, Total: 9.5 ug/dL (ref 4.5–12.0)

## 2021-11-23 NOTE — Assessment & Plan Note (Signed)
I did not appreciate any new lung findings The mediastinal lymphadenopathy and changes in the lung is most likely due to sarcoidosis

## 2021-11-23 NOTE — Progress Notes (Signed)
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 The patient denies recent history of bleeding such as epistaxis, hematuria or hematochezia. She is asymptomatic from the anemia. I will observe for now.  She does not require transfusion now. I will continue the chemotherapy at current dose without dosage adjustment.  If the anemia gets progressive worse in the future, I might have to delay her treatment or adjust the chemotherapy dose.   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   All questions were answered. The patient knows to call the clinic with any problems, questions or concerns. The total time spent in the appointment was 40 minutes encounter with patients including review of chart and various tests results, discussions about plan of care and coordination of care plan   [name], MD 11/23/2021 4:43 PM  INTERVAL HISTORY: Please see below for problem oriented charting. she returns for treatment follow-up  Treatment was placed on hold recently due to feeling unwell Since then, she started  eating and drinking well No further nausea, vomiting or diarrhea Her appetite is fair No new lymphadenopathy Her chronic lower extremity edema is stable  REVIEW OF SYSTEMS:   Constitutional: Denies fevers, chills or abnormal weight loss Eyes: Denies blurriness of vision Ears, nose, mouth, throat, and face: Denies mucositis or sore throat Respiratory: Denies cough, dyspnea or wheezes Cardiovascular: Denies palpitation, chest discomfort or lower extremity swelling Gastrointestinal:  Denies nausea, heartburn or change in bowel habits Skin: Denies abnormal skin rashes Lymphatics: Denies new lymphadenopathy or easy bruising Neurological:Denies numbness, tingling or new weaknesses Behavioral/Psych: Mood is stable, no new changes  All other systems were reviewed with the patient and are negative.  I have reviewed the past medical history, past surgical history, social history and family history with the patient and they are unchanged from previous note.  ALLERGIES:  is allergic to shellfish allergy and penicillins.  MEDICATIONS:  Current Outpatient Medications  Medication Sig Dispense Refill   triamcinolone (NASACORT) 55 MCG/ACT AERO nasal inhaler Place 2 sprays into each nostril daily. 16.9 mL 12   albuterol (PROAIR HFA) 108 (90 Base) MCG/ACT inhaler 2 puffs every 4 hours as needed only  if your can't catch your breath 18 g 2   amLODipine (NORVASC) 10 MG tablet      bisoprolol (ZEBETA) 10 MG tablet Take 1 tablet (10 mg total) by mouth in the morning and at bedtime. 180 tablet 0   desonide (DESOWEN) 0.05 % lotion Apply topically 2 (two) times daily. Continue following with dermatologist 59 mL 0   dextromethorphan (DELSYM) 30 MG/5ML liquid Take by mouth as needed for cough.      hydrochlorothiazide (  HYDRODIURIL) 25 MG tablet Take 25 mg by mouth daily.     lenvatinib 20 mg daily dose (LENVIMA) 2 x 10 MG capsule Take 1 capsule (10 mg total) by mouth daily. 30 capsule 11   levothyroxine  (SYNTHROID) 75 MCG tablet Take 1 tablet (75 mcg total) by mouth daily before breakfast. 30 tablet 3   lidocaine-prilocaine (EMLA) cream Apply 1 application topically as needed. 30 g 3   loratadine (CLARITIN) 10 MG tablet Take 10 mg by mouth daily as needed for allergies.     mometasone-formoterol (DULERA) 100-5 MCG/ACT AERO Take 2 puffs first thing in am and then another 2 puffs about 12 hours later. 3 each 3   neomycin-polymyxin-hydrocortisone (CORTISPORIN) OTIC solution Place 3 drops into the left ear 4 (four) times daily. 10 mL 0   nystatin (MYCOSTATIN/NYSTOP) powder Apply to affected area 2 (two) times daily. 60 g 0   ondansetron (ZOFRAN) 8 MG tablet Take 1 tablet (8 mg total) by mouth every 8 (eight) hours as needed for nausea. 30 tablet 3   pantoprazole (PROTONIX) 40 MG tablet Take 1 tablet (40 mg total) by mouth daily. Patient needs appt for further refills. 90 tablet 0   polyethylene glycol (MIRALAX / GLYCOLAX) 17 g packet Take 17 g by mouth daily.     prochlorperazine (COMPAZINE) 10 MG tablet TAKE 1 TABLET BY MOUTH EVERY 6 HOURS AS NEEDED FOR NAUSEA OR VOMITING 60 tablet 3   RABEprazole (ACIPHEX) 20 MG tablet Take 1 tablet (20 mg total) by mouth daily before breakfast. 90 tablet 3   spironolactone (ALDACTONE) 25 MG tablet Take 1 tablet by mouth daily. 30 tablet 1   valACYclovir (VALTREX) 1000 MG tablet Take 1 tablet (1,000 mg total) by mouth 3 (three) times daily. 21 tablet 0   vitamin C (ASCORBIC ACID) 500 MG tablet Take 500 mg by mouth daily.     No current facility-administered medications for this visit.    SUMMARY OF ONCOLOGIC HISTORY: Oncology History Overview Note  ER/PR neg, MSI stable   Endometrial cancer ([place])  06/15/2018 Surgery   Surgeon: Donaciano Eva     Operation: Robotic-assisted laparoscopic total hysterectomy with bilateral salpingoophorectomy, SLN biopsy     Operative Findings:  : 6cm uterus, normal tubes and ovaries, omentum adherent to the anterior  abdominal wall and uterine fundus. No suspicious nodes.   06/15/2018 Pathology Results   Procedure: Total hysterectomy with bilateral salpingo-oophorectomy. Bilateral obturator sentinel lymph node biopsies. Histologic type: Endometrioid adenocarcinoma. Histologic Grade: FIGO grade 1. Myometrial invasion: Depth of invasion: 15 mm Myometrial thickness: 20 mm Uterine Serosa Involvement: Not identified. Cervical stromal involvement: Not identified. Extent of involvement of other organs: Uninvolved. Lymphovascular invasion: Present. Regional Lymph Nodes: Examined: 2 Sentinel 0 Non-sentinel 2 Total Lymph nodes with metastasis: 0 Isolated tumor cells (< 0.2 mm): 2 Micrometastasis: (> 0.2 mm and < 2.0 mm): 0 Macrometastasis: (> 2.0 mm): 0 Extracapsular extension: N/A. Tumor block for ancillary studies: 4Q-S. MMR / MSI testing: Will be ordered. Pathologic Stage Classification (pTNM, AJCC 8th edition): pT1b, pN0(i+)   07/20/2018 Imaging   1. Surgical changes from recent hysterectomy. No findings suspicious for residual tumor or adenopathy in the pelvis. 2. No CT findings to suggest omental, peritoneal surface or solid organ metastatic disease in the abdomen. 3. Changes consistent with known sarcoidosis in the chest with bulky calcified mediastinal and hilar lymph nodes and interstitial lung disease. I do not see any definite CT findings to suggest metastatic disease involving the chest.  08/19/2018 - 09/17/2018 Radiation Therapy   08/19/18 - 09/17/18: Vaginal Cuff, 3 cm cylinder with treatment length of 3 cm / 30 Gy delivered in 5 fractions of 6 Gy, Brachytherapy / HDR, Iridium-192   05/2005: Left Chest Wall keloid   04/2004 - 05/2004: Anus, pelvis /inguinal ( Dr. Tammi Klippel)   01/19/2020 Imaging   1. No definite CT findings of the abdomen or pelvis to explain pain. 2. The sigmoid colon and rectum are decompressed although slightly thickened appearing, suggestive of nonspecific infectious,  inflammatory, or ischemic colitis. Correlate for referable clinical symptoms. 3. There is trace, nonspecific free fluid in the low pelvis, which may be reactive, although would be difficult to distinguish from postoperative and post treatment soft tissue change in the pelvis given history of hysterectomy and rectal cancer. 4. Unchanged prominent retroperitoneal and iliac lymph nodes. No new findings suspicious for metastatic disease in the abdomen or pelvis. 5. Bibasilar pulmonary fibrosis and mediastinal and hilar lymphadenopathy, better evaluated by prior CT chest and consistent with patient diagnosis of sarcoidosis.       04/18/2020 Imaging   Venous Doppler Left:  - No evidence of deep vein thrombosis seen in the left lower extremity, from the common femoral through the popliteal veins.  - No evidence of superficial venous thrombosis in the left lower extremity.  - There is no evidence of venous reflux seen in the left lower extremity.  - Rouleax flow noted in the superficial system.    05/18/2020 PET scan   1. Complex scan with a combination of hypermetabolic granulomatous disease (sarcoidosis) as well as hypermetabolic metastatic endometrial carcinoma. 2. Hypermetabolic mediastinal hilar lymph nodes and pulmonary nodularity with bronchovascular thickening is consistent with sarcoidosis. 3. Solitary hypermetabolic nodule in the medial LEFT lower lobe, new from prior with intense metabolic activity, is concerning for a metastatic deposit to the LEFT lower lobe. 4. Ill-defined hypermetabolic soft tissue along the LEFT operator space is most consistent with local recurrence of endometrial carcinoma. There is entrapment of the LEFT ureter by this hypermetabolic soft tissue with mild hydroureter and hydronephrosis on the LEFT. 5. Additionally there appears to be direct extension from the hypermetabolic left operator space soft tissue to the sigmoid colon with there is a well-defined hypermetabolic  lesion in the colon. 6. More distant hypermetabolic peritoneal metastasis along the ventral peritoneal surface of the RIGHT abdomen beneath RIGHT rectus abdominus muscle. 7. Hypermetabolic lymph nodes in the porta hepatis are favored related to sarcoidosis.   05/19/2020 Cancer Staging   Staging form: Corpus Uteri - Carcinoma and Carcinosarcoma, AJCC 8th Edition - Clinical stage from 05/19/2020: Stage IVB (rcT1, cN2a, cM1) - Signed by [name], MD on 05/19/2020    05/26/2020 Procedure   Successful placement of a right internal jugular approach power injectable Port-A-Cath. The catheter is ready for immediate use   05/29/2020 - 09/27/2020 Chemotherapy   The patient had carboplatin and taxol for chemotherapy treatment.     07/31/2020 Imaging   1. Asymmetric soft tissue mass extending from the left vaginal cuff has decreased in size in the interval. No residual peritoneal nodularity. Pulmonary metastases are stable with exception of a minimally improved left lower lobe nodule. 2. Stable soft tissue nodule associated with the right rectus abdominus muscle. 3. Interval resolution of left hydronephrosis. 4. Calcified mediastinal and hilar lymph nodes, together with pulmonary parenchymal fibrosis, findings which are in keeping with patient's known diagnosis of sarcoid. 5. Aortic atherosclerosis (ICD10-I70.0). Coronary artery calcification.   10/27/2020 Imaging  1. Essentially stable appearance of the soft tissue density nodule posteriorly in the left rectus abdominus muscle, currently 1.8 by 1.2 cm, previously 11 mm. 2. Stable calcified adenopathy in the mediastinum and hila, compatible with prior granulomatous disease. 3. Stable small right lower lobe pulmonary nodules. 4. Stable soft tissue density prominence along the left vaginal cuff extending up towards the adnexa, probably therapy related. 5. Other imaging findings of potential clinical significance: Coronary atherosclerosis. Small type 1  hiatal hernia. Stable subpleural reticulation favoring the lung bases, possibly from mild fibrosis. 6. Aortic atherosclerosis.   01/19/2021 Imaging   1. Status post hysterectomy. Interval enlargement of soft tissue adjacent to the left aspect of the vaginal cuff and bladder, measuring approximately 4.3 x 2.9 cm in axial section, previously previously previously 3.7 x 1.8 cm when measured similarly. Extensive soft tissue thickening and stranding throughout the low pelvis. 2. Interval enlargement of a metastatic nodule within the right rectus abdominus. New small peritoneal nodule anterior to the sigmoid colon. 3. Interval enlargement of retroperitoneal retroperitoneal lymph nodes. 4. Multiple small bilateral pulmonary nodules, several of which are new and enlarged. 5. Constellation of findings above is consistent with worsened metastatic disease. 6. New left hydronephrosis and hydroureter, the distal left ureter obstructed by soft tissue near the left aspect of the urinary bladder and vaginal cuff. 7. Unchanged enlarged, partially calcified mediastinal and hilar lymph nodes,, in keeping with prior granulomatous infection or sarcoidosis. 8. Mild underlying pulmonary fibrosis, in a UIP pattern. 9. Coronary artery disease.   02/02/2021 - 07/13/2021 Chemotherapy    Patient is on carboplatin and docetaxel    04/17/2021 Imaging   1. Interval decrease in size of multiple pulmonary nodules, with resolution of some previously seen nodules. 2. Interval decrease in size of enlarged retroperitoneal lymph nodes. 3. Interval decrease in size of soft tissue at the left aspect of the vaginal cuff status post hysterectomy. 4. Interval decrease in size of a soft tissue nodule within the posterior aspect of the right rectus abdominus muscle body. 5. Constellation of findings is consistent with treatment response of locally recurrent, nodal, and distant metastatic disease. 6. There has been interval resolution of  previously seen left hydronephrosis and hydroureter. 7. 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   Stable mild partially calcified mediastinal and bilateral hilar lymphadenopathy.   Stable sub-cm abdominal retroperitoneal lymph nodes.   Aortic Atherosclerosis (ICD10-I70.0).     09/03/2021 -  Chemotherapy   Patient is on Treatment Plan : UTERINE Lenvatinib + Pembrolizumab q21d     11/21/2021 Imaging   Soft tissue nodule in the RIGHT rectus muscle with enhancement (image 79/2) this measures 1.7 x 1.8 is cm not substantially changed compared to prior imaging from August and with enlargement since May of 2022 where it measured approximately 16 x 11 mm.    Signs of hysterectomy and post treatment related changes in the pelvis. No new signs of disease.   Septal thickening and ground-glass in the chest with similar appearance aside from a new area of bandlike changes in the RIGHT upper lobe favored to be post infectious or inflammatory, attention on follow-up.   Stable enlarged mediastinal and bihilar lymph nodes, partially calcified. Patient has reported history of sarcoidosis. Some of the pulmonary findings above may be related underlying sarcoidosis despite atypical distribution.   Aortic atherosclerosis.   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     02/02/2021 - 07/13/2021 Chemotherapy    Patient is on carboplatin and docetaxel    09/03/2021 -  Chemotherapy   Patient is on Treatment Plan : UTERINE Lenvatinib + Pembrolizumab q21d       PHYSICAL EXAMINATION: ECOG PERFORMANCE STATUS: 2 - Symptomatic, <50% confined to bed  Vitals:   11/22/21 1059  BP: (!) 169/83  Pulse: 85  Resp: 18  Temp: 97.9 F (36.6 C)  SpO2: 98%   Filed Weights   11/22/21 1059  Weight: 206 lb 1.6 oz (93.5 kg)    GENERAL:alert, no distress and comfortable SKIN: skin color, texture, turgor are normal, no rashes or significant lesions EYES: normal, Conjunctiva are pink and non-injected, sclera clear OROPHARYNX:no exudate, no erythema and lips, buccal mucosa, and tongue normal  NECK: supple, thyroid normal size, non-tender, without nodularity LYMPH:  no palpable lymphadenopathy in the cervical, axillary or inguinal LUNGS: clear to auscultation and percussion with normal breathing effort HEART: regular rate & rhythm and no murmurs with persistent mild bilateral lower extremity edema ABDOMEN:abdomen soft, non-tender and normal bowel sounds Musculoskeletal:no cyanosis of digits and no clubbing  NEURO: alert & oriented x 3 with fluent speech, no focal motor/sensory deficits  LABORATORY DATA:  I have reviewed the data as listed    Component Value Date/Time   NA 142 11/22/2021 1037   K 3.7 11/22/2021 1037   CL 107 11/22/2021 1037   CO2 23 11/22/2021 1037   GLUCOSE 102 (H) 11/22/2021 1037   BUN 15 11/22/2021 1037   CREATININE 0.92 11/22/2021 1037   CALCIUM 9.3 11/22/2021 1037   PROT 7.4 11/22/2021 1037   ALBUMIN 3.6 11/22/2021 1037   AST 21 11/22/2021 1037   ALT 21 11/22/2021 1037   ALKPHOS 92 11/22/2021 1037   BILITOT 0.7 11/22/2021 1037   GFRNONAA >60 11/22/2021 1037   GFRAA >60 09/08/2020 0819    No results found for: SPEP,  UPEP  Lab Results  Component Value Date   WBC 4.7 11/22/2021   NEUTROABS 2.9 11/22/2021   HGB 10.2 (L) 11/22/2021   HCT 30.5 (L) 11/22/2021   MCV 97.1 11/22/2021   PLT 180 11/22/2021      Chemistry      Component Value Date/Time   NA 142 11/22/2021 1037   K 3.7 11/22/2021 1037   CL 107 11/22/2021 1037   CO2 23 11/22/2021 1037   BUN 15 11/22/2021 1037   CREATININE 0.92 11/22/2021 1037      Component Value Date/Time   CALCIUM 9.3 11/22/2021 1037   ALKPHOS 92 11/22/2021 1037   AST 21 11/22/2021 1037   ALT 21 11/22/2021 1037   BILITOT 0.7 11/22/2021 1037       RADIOGRAPHIC STUDIES: I have reviewed multiple CT imaging with patient and family I have personally reviewed the radiological images as listed and agreed with the  findings in the report. DG Chest 2 View  Result Date: 11/12/2021 CLINICAL DATA:  Fever and chills.  History of endometrial cancer. EXAM: CHEST - 2 VIEW COMPARISON:  07/26/2021 CT.  09/30/2019 chest radiograph. FINDINGS: Hyperinflation.  Right Port-A-Cath tip at high right atrium. Midline trachea. Normal heart size. Tortuous thoracic aorta. Soft tissue fullness in the hila is likely related to adenopathy when correlated with prior CT. No pleural effusion or pneumothorax. Clear lungs. IMPRESSION: No evidence of pneumonia or explanation for  fevers. Electronically Signed   By: Abigail Miyamoto M.D.   On: 11/12/2021 10:26   CT CHEST ABDOMEN PELVIS W CONTRAST  Result Date: 11/22/2021 CLINICAL DATA:  A 65 year old female presents for evaluation of endometrial carcinoma with restaging evaluation. EXAM: CT CHEST, ABDOMEN, AND PELVIS WITH CONTRAST TECHNIQUE: Multidetector CT imaging of the chest, abdomen and pelvis was performed following the standard protocol during bolus administration of intravenous contrast. CONTRAST:  39m OMNIPAQUE IOHEXOL 350 MG/ML SOLN COMPARISON:  Comparison is made with July 25, 2021. FINDINGS: CT CHEST FINDINGS Cardiovascular: Calcified aortic  atherosclerosis and noncalcified plaque in the thoracic aorta. Central pulmonary vasculature is unremarkable. Heart size normal with signs of coronary artery calcification. No substantial pericardial effusion. RIGHT-sided Port-A-Cath terminates at the caval to atrial junction. Mediastinum/Nodes: Enlarged lymph nodes in the chest with added density show no substantial change from previous imaging For instance, a precarinal lymph node 23 mm previously between 23 and 25 mm short axis. Subcarinal lymph node (image 27/2) 20 mm short axis, unchanged. Bi hilar nodal tissue with calcification similarly stable. No new signs of nodal disease. Esophagus grossly normal. Lungs/Pleura: Mild ground-glass and septal thickening shows a similar appearance to previous imaging. No discrete nodule. No sign of pleural effusion. Minimal bronchiectasis and bronchiolectasis in the RIGHT middle lobe. RIGHT paramediastinal ground-glass is new and bandlike (image 31/4) Musculoskeletal: See below for full musculoskeletal details. CT ABDOMEN PELVIS FINDINGS Hepatobiliary: Post cholecystectomy, no focal hepatic lesion. Portal vein is patent. Pancreas: Normal, without mass, inflammation or ductal dilatation. Spleen: Normal size and contour. Adrenals/Urinary Tract: Adrenal glands are unremarkable. Symmetric renal enhancement. No sign of hydronephrosis. No suspicious renal lesion or perinephric stranding. Urinary bladder is grossly unremarkable. Stomach/Bowel: Small hiatal hernia. No acute gastrointestinal process. The appendix is normal. Vascular/Lymphatic: Aortic atherosclerosis. No sign of aneurysm. Smooth contour of the IVC. There is no gastrohepatic or hepatoduodenal ligament lymphadenopathy. No retroperitoneal or mesenteric lymphadenopathy. No pelvic sidewall lymphadenopathy. Reproductive: Signs of hysterectomy. Fascial thickening is present in the pelvis without substantial change from previous imaging. Urinary bladder is collapsed in the  pelvis also without gross change in terms of surrounding fascial thickening presumably a combination of postoperative and post treatment related changes. Other: No ascites. Musculoskeletal: No acute bone finding. No destructive bone process. Spinal degenerative changes. Soft tissue nodule in the RIGHT rectus muscle similar to prior imaging along the posterior margin of the RIGHT rectus IMPRESSION: Soft tissue nodule in the RIGHT rectus muscle with enhancement (image 79/2) this measures 1.7 x 1.8 is cm not substantially changed compared to prior imaging from August and with enlargement since May of 2022 where it measured approximately 16 x 11 mm. Signs of hysterectomy and post treatment related changes in the pelvis. No new signs of disease. Septal thickening and ground-glass in the chest with similar appearance aside from a new area of bandlike changes in the RIGHT upper lobe favored to be post infectious or inflammatory, attention on follow-up. Stable enlarged mediastinal and bihilar lymph nodes, partially calcified.  Patient has reported history of sarcoidosis. Some of the pulmonary findings above may be related underlying sarcoidosis despite atypical distribution. Aortic atherosclerosis. Aortic Atherosclerosis (ICD10-I70.0). Electronically Signed   By: Zetta Bills M.D.   On: 11/22/2021 10:54

## 2021-11-23 NOTE — Assessment & Plan Note (Signed)
She is recovering from recent upper respiratory tract infection I am still concerned about her ability to monitor for side effects of treatment Thankfully, CT imaging showed stable disease We will continue treatment and resume therapy today I plan to repeat imaging study again in 4 months, due April 2023

## 2021-11-23 NOTE — Assessment & Plan Note (Signed)

## 2021-11-23 NOTE — Assessment & Plan Note (Signed)
Her blood pressure is intermittently elevated She will continue close monitoring of blood pressure at home

## 2021-11-26 ENCOUNTER — Other Ambulatory Visit (HOSPITAL_COMMUNITY): Payer: Self-pay

## 2021-11-27 ENCOUNTER — Telehealth: Payer: Self-pay

## 2021-11-27 NOTE — Telephone Encounter (Signed)
Called and given below message. She verbalized understanding and will call the office back if needed. ?

## 2021-11-27 NOTE — Telephone Encounter (Signed)
-----   Message from [name], MD sent at 11/27/2021  8:58 AM EST ----- Regarding: BP Can you call and ask for BP readings?

## 2021-11-27 NOTE — Telephone Encounter (Signed)
Called and given below message.  BP readings. Am 12/9 138/76, midday 139/79 and 4 pm 140/80. Am 12/10 138/74 midday 137/77 and 6 pm 137/74. Am 12/11 14477, midday 14176 and 6 pm 143/77. Am 12/12 139/79, midday 138/80 and 5 pm 139/80 She has not checked her bp today.  She is complaining of lower mid abdominal pain that started yesterday. Denies constipation. Denies fever. Complaining of urinary frequency. She does not think that she is drinking enough and she has a hard time starting to urinate at times.  Instructed to take tylenol as needed and drink more fluids. She verbalized understanding.

## 2021-11-27 NOTE — Telephone Encounter (Signed)
We just have a normal CT, so I suspect her abdominal pain is not related to cancer Continue to monitor BP

## 2021-11-30 ENCOUNTER — Telehealth: Payer: Self-pay

## 2021-11-30 NOTE — Telephone Encounter (Signed)
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

## 2021-11-30 NOTE — Telephone Encounter (Signed)
Called and given below message. She verbalized understanding. She will call the office back if needed.

## 2021-11-30 NOTE — Telephone Encounter (Signed)
Returned her call. Yesterday she started having right hand numbness and dizziness when she gets up to walk. She usually has numbness in her finger tips. She is eating/drinking with no problems. No other complaints.  Bp today 143/86 and she is being careful when she gets up to walk. She said she does not take anything for neuropathy.

## 2021-12-02 ENCOUNTER — Other Ambulatory Visit: Payer: Self-pay

## 2021-12-03 ENCOUNTER — Telehealth: Payer: Self-pay

## 2021-12-03 NOTE — Telephone Encounter (Signed)
Called back again regarding after hours call. She had left her phone in the bedroom and missed the previous calls.   BP yesterday am 168/92 and 1 pm bp 166/88. Today bp am 169/92 and 2:21 pm bp 188/92. She is taking bisoprolol 20 mg daily am.  She is is complaining of dizziness and feeling off balance. Still having nasal congestion, which maybe a little better after starting the the Nasacort.   Instructed to keep taking medications as instructed, monitor bp and I will call her back in the am. For worsening symptoms to go the ER to be evaluated. She verbalized understanding.

## 2021-12-03 NOTE — Telephone Encounter (Signed)
Called and left a message for her to call the office back. She called the after hours phone # with a elevated bp on 12/17.

## 2021-12-03 NOTE — Telephone Encounter (Signed)
Called and left another message to call the office.

## 2021-12-04 ENCOUNTER — Other Ambulatory Visit: Payer: Self-pay | Admitting: Hematology and Oncology

## 2021-12-04 ENCOUNTER — Other Ambulatory Visit (HOSPITAL_COMMUNITY): Payer: Self-pay

## 2021-12-04 ENCOUNTER — Encounter: Payer: Self-pay | Admitting: Hematology and Oncology

## 2021-12-04 MED ORDER — HYDROCHLOROTHIAZIDE 25 MG PO TABS
25.0000 mg | ORAL_TABLET | Freq: Every day | ORAL | 3 refills | Status: DC
  Filled 2021-12-04: qty 30, 30d supply, fill #0

## 2021-12-04 NOTE — Telephone Encounter (Signed)
Called and given below message. She verbralzied understanding. She has left over HCTZ at home from a previous and took one while on the phone. Her daughter will pick up Rx. Bp last pm 163/84 and this am 200/104. She will call the office back if needed and continue to monitor bp.

## 2021-12-04 NOTE — Telephone Encounter (Signed)
I will add back HCTZ, will send to her pharmacy Take HCTZ in the morning

## 2021-12-05 ENCOUNTER — Inpatient Hospital Stay (HOSPITAL_BASED_OUTPATIENT_CLINIC_OR_DEPARTMENT_OTHER): Payer: Medicare HMO | Admitting: Physician Assistant

## 2021-12-05 ENCOUNTER — Other Ambulatory Visit: Payer: Self-pay

## 2021-12-05 ENCOUNTER — Telehealth: Payer: Self-pay | Admitting: Oncology

## 2021-12-05 ENCOUNTER — Inpatient Hospital Stay: Payer: Medicare HMO

## 2021-12-05 ENCOUNTER — Other Ambulatory Visit: Payer: Medicare HMO

## 2021-12-05 VITALS — BP 179/98 | HR 64 | Temp 97.4°F | Resp 16

## 2021-12-05 DIAGNOSIS — C541 Malignant neoplasm of endometrium: Secondary | ICD-10-CM | POA: Diagnosis not present

## 2021-12-05 DIAGNOSIS — R638 Other symptoms and signs concerning food and fluid intake: Secondary | ICD-10-CM | POA: Diagnosis not present

## 2021-12-05 DIAGNOSIS — C775 Secondary and unspecified malignant neoplasm of intrapelvic lymph nodes: Secondary | ICD-10-CM

## 2021-12-05 DIAGNOSIS — Z5112 Encounter for antineoplastic immunotherapy: Secondary | ICD-10-CM | POA: Diagnosis not present

## 2021-12-05 DIAGNOSIS — E039 Hypothyroidism, unspecified: Secondary | ICD-10-CM | POA: Diagnosis not present

## 2021-12-05 DIAGNOSIS — Z79899 Other long term (current) drug therapy: Secondary | ICD-10-CM | POA: Diagnosis not present

## 2021-12-05 DIAGNOSIS — W19XXXA Unspecified fall, initial encounter: Secondary | ICD-10-CM

## 2021-12-05 DIAGNOSIS — I10 Essential (primary) hypertension: Secondary | ICD-10-CM

## 2021-12-05 DIAGNOSIS — C7802 Secondary malignant neoplasm of left lung: Secondary | ICD-10-CM | POA: Diagnosis not present

## 2021-12-05 LAB — CMP (CANCER CENTER ONLY)
ALT: 28 U/L (ref 0–44)
AST: 27 U/L (ref 15–41)
Albumin: 3.9 g/dL (ref 3.5–5.0)
Alkaline Phosphatase: 93 U/L (ref 38–126)
Anion gap: 10 (ref 5–15)
BUN: 11 mg/dL (ref 8–23)
CO2: 26 mmol/L (ref 22–32)
Calcium: 9.1 mg/dL (ref 8.9–10.3)
Chloride: 103 mmol/L (ref 98–111)
Creatinine: 0.95 mg/dL (ref 0.44–1.00)
GFR, Estimated: >60 mL/min (ref >60)
Glucose, Bld: 118 mg/dL — ABNORMAL HIGH (ref 70–99)
Potassium: 3.4 mmol/L — ABNORMAL LOW (ref 3.5–5.1)
Sodium: 139 mmol/L (ref 135–145)
Total Bilirubin: 0.8 mg/dL (ref 0.3–1.2)
Total Protein: 7.7 g/dL (ref 6.5–8.1)

## 2021-12-05 LAB — CBC WITH DIFFERENTIAL (CANCER CENTER ONLY)
Abs Immature Granulocytes: 0.02 10*3/uL (ref 0.00–0.07)
Basophils Absolute: 0 10*3/uL (ref 0.0–0.1)
Basophils Relative: 0 %
Eosinophils Absolute: 0.2 10*3/uL (ref 0.0–0.5)
Eosinophils Relative: 3 %
HCT: 32.2 % — ABNORMAL LOW (ref 36.0–46.0)
Hemoglobin: 11.3 g/dL — ABNORMAL LOW (ref 12.0–15.0)
Immature Granulocytes: 0 %
Lymphocytes Relative: 19 %
Lymphs Abs: 1.1 10*3/uL (ref 0.7–4.0)
MCH: 32.7 pg (ref 26.0–34.0)
MCHC: 35.1 g/dL (ref 30.0–36.0)
MCV: 93.1 fL (ref 80.0–100.0)
Monocytes Absolute: 0.7 10*3/uL (ref 0.1–1.0)
Monocytes Relative: 11 %
Neutro Abs: 4.1 10*3/uL (ref 1.7–7.7)
Neutrophils Relative %: 67 %
Platelet Count: 161 10*3/uL (ref 150–400)
RBC: 3.46 MIL/uL — ABNORMAL LOW (ref 3.87–5.11)
RDW: 15.4 % (ref 11.5–15.5)
WBC Count: 6.1 10*3/uL (ref 4.0–10.5)
nRBC: 0 % (ref 0.0–0.2)

## 2021-12-05 LAB — TSH: TSH: 8.124 u[IU]/mL — ABNORMAL HIGH (ref 0.308–3.960)

## 2021-12-05 LAB — TOTAL PROTEIN, URINE DIPSTICK: Protein, ur: NEGATIVE mg/dL

## 2021-12-05 MED ORDER — SODIUM CHLORIDE 0.9 % IV SOLN: Freq: Once | INTRAVENOUS | Status: AC

## 2021-12-05 MED ORDER — SODIUM CHLORIDE 0.9% FLUSH
10.0000 mL | Freq: Once | INTRAVENOUS | Status: AC
  Administered 2021-12-05: 13:00:00 10 mL

## 2021-12-05 NOTE — Patient Instructions (Addendum)
Keep follow up appointment with Dr. Alvy Bimler and talk about thyroid levels.   Increase fluid intake.   If new symptoms develop or anything worsens you could seek treatment in the Emergency Department or call 911.   Rehydration, Adult Rehydration is the replacement of body fluids, salts, and minerals (electrolytes) that are lost during dehydration. Dehydration is when there is not enough water or other fluids in the body. This happens when you lose more fluids than you take in. Common causes of dehydration include: Not drinking enough fluids. This can occur when you are ill or doing activities that require a lot of energy, especially in hot weather. Conditions that cause loss of water or other fluids, such as diarrhea, vomiting, sweating, or urinating a lot. Other illnesses, such as fever or infection. Certain medicines, such as those that remove excess fluid from the body (diuretics). Symptoms of mild or moderate dehydration may include thirst, dry lips and mouth, and dizziness. Symptoms of severe dehydration may include increased heart rate, confusion, fainting, and not urinating. For severe dehydration, you may need to get fluids through an IV at the hospital. For mild or moderate dehydration, you can usually rehydrate at home by drinking certain fluids as told by your health care provider. What are the risks? Generally, rehydration is safe. However, taking in too much fluid (overhydration) can be a problem. This is rare. Overhydration can cause an electrolyte imbalance, kidney failure, or a decrease in salt (sodium) levels in the body. Supplies needed You will need an oral rehydration solution (ORS) if your health care provider tells you to use one. This is a drink to treat dehydration. It can be found in pharmacies and retail stores. How to rehydrate Fluids Follow instructions from your health care provider for rehydration. The kind of fluid and the amount you should drink depend on your  condition. In general, you should choose drinks that you prefer. If told by your health care provider, drink an ORS. Make an ORS by following instructions on the package. Start by drinking small amounts, about  cup (120 mL) every 5-10 minutes. Slowly increase how much you drink until you have taken the amount recommended by your health care provider. Drink enough clear fluids to keep your urine pale yellow. If you were told to drink an ORS, finish it first, then start slowly drinking other clear fluids. Drink fluids such as: Water. This includes sparkling water and flavored water. Drinking only water can lead to having too little sodium in your body (hyponatremia). Follow the advice of your health care provider. Water from ice chips you suck on. Fruit juice with water you add to it (diluted). Sports drinks. Hot or cold herbal teas. Broth-based soups. Milk or milk products. Food Follow instructions from your health care provider about what to eat while you rehydrate. Your health care provider may recommend that you slowly begin eating regular foods in small amounts. Eat foods that contain a healthy balance of electrolytes, such as bananas, oranges, potatoes, tomatoes, and spinach. Avoid foods that are greasy or contain a lot of sugar. In some cases, you may get nutrition through a feeding tube that is passed through your nose and into your stomach (nasogastric tube, or NG tube). This may be done if you have uncontrolled vomiting or diarrhea. Beverages to avoid Certain beverages may make dehydration worse. While you rehydrate, avoid drinking alcohol. How to tell if you are recovering from dehydration You may be recovering from dehydration if: You are urinating more  often than before you started rehydrating. Your urine is pale yellow. Your energy level improves. You vomit less frequently. You have diarrhea less frequently. Your appetite improves or returns to normal. You feel less dizzy or  less light-headed. Your skin tone and color start to look more normal. Follow these instructions at home: Take over-the-counter and prescription medicines only as told by your health care provider. Do not take sodium tablets. Doing this can lead to having too much sodium in your body (hypernatremia). Contact a health care provider if: You continue to have symptoms of mild or moderate dehydration, such as: Thirst. Dry lips. Slightly dry mouth. Dizziness. Dark urine or less urine than normal. Muscle cramps. You continue to vomit or have diarrhea. Get help right away if you: Have symptoms of dehydration that get worse. Have a fever. Have a severe headache. Have been vomiting and the following happens: Your vomiting gets worse or does not go away. Your vomit includes blood or green matter (bile). You cannot eat or drink without vomiting. Have problems with urination or bowel movements, such as: Diarrhea that gets worse or does not go away. Blood in your stool (feces). This may cause stool to look black and tarry. Not urinating, or urinating only a small amount of very dark urine, within 6-8 hours. Have trouble breathing. Have symptoms that get worse with treatment. These symptoms may represent a serious problem that is an emergency. Do not wait to see if the symptoms will go away. Get medical help right away. Call your local emergency services (911 in the [place] Do not drive yourself to the hospital. Summary Rehydration is the replacement of body fluids and minerals (electrolytes) that are lost during dehydration. Follow instructions from your health care provider for rehydration. The kind of fluid and amount you should drink depend on your condition. Slowly increase how much you drink until you have taken the amount recommended by your health care provider. Contact your health care provider if you continue to show signs of mild or moderate dehydration. This information is not intended  to replace advice given to you by your health care provider. Make sure you discuss any questions you have with your health care provider. Document Revised: 02/02/2020 Document Reviewed: 12/13/2019 [employer] Patient Education  2022 [employer].

## 2021-12-05 NOTE — Progress Notes (Signed)
Symptom Management Consult note [facility]    Patient Care Team: [name], [name], MD as PCP - General (Family Medicine) Awanda Mink Craige Cotta, RN as Oncology Nurse Navigator (Oncology)    Name of the patient: [name]  [id number]  05-30-1956   Date of visit: 12/05/2021    Chief complaint/ Reason for visit- elevated blood pressure, dizzy, fall  Oncology History Overview Note  ER/PR neg, MSI stable   Endometrial cancer ([place])  06/15/2018 Surgery   Surgeon: Donaciano Eva     Operation: Robotic-assisted laparoscopic total hysterectomy with bilateral salpingoophorectomy, SLN biopsy     Operative Findings:  : 6cm uterus, normal tubes and ovaries, omentum adherent to the anterior abdominal wall and uterine fundus. No suspicious nodes.   06/15/2018 Pathology Results   Procedure: Total hysterectomy with bilateral salpingo-oophorectomy. Bilateral obturator sentinel lymph node biopsies. Histologic type: Endometrioid adenocarcinoma. Histologic Grade: FIGO grade 1. Myometrial invasion: Depth of invasion: 15 mm Myometrial thickness: 20 mm Uterine Serosa Involvement: Not identified. Cervical stromal involvement: Not identified. Extent of involvement of other organs: Uninvolved. Lymphovascular invasion: Present. Regional Lymph Nodes: Examined: 2 Sentinel 0 Non-sentinel 2 Total Lymph nodes with metastasis: 0 Isolated tumor cells (< 0.2 mm): 2 Micrometastasis: (> 0.2 mm and < 2.0 mm): 0 Macrometastasis: (> 2.0 mm): 0 Extracapsular extension: N/A. Tumor block for ancillary studies: 4Q-S. MMR / MSI testing: Will be ordered. Pathologic Stage Classification (pTNM, AJCC 8th edition): pT1b, pN0(i+)   07/20/2018 Imaging   1. Surgical changes from recent hysterectomy. No findings suspicious for residual tumor or adenopathy in the pelvis. 2. No CT findings to suggest omental, peritoneal surface or solid organ metastatic disease in the abdomen. 3. Changes consistent with  known sarcoidosis in the chest with bulky calcified mediastinal and hilar lymph nodes and interstitial lung disease. I do not see any definite CT findings to suggest metastatic disease involving the chest.   08/19/2018 - 09/17/2018 Radiation Therapy   08/19/18 - 09/17/18: Vaginal Cuff, 3 cm cylinder with treatment length of 3 cm / 30 Gy delivered in 5 fractions of 6 Gy, Brachytherapy / HDR, Iridium-192   05/2005: Left Chest Wall keloid   04/2004 - 05/2004: Anus, pelvis /inguinal ( Dr. Tammi Klippel)   01/19/2020 Imaging   1. No definite CT findings of the abdomen or pelvis to explain pain. 2. The sigmoid colon and rectum are decompressed although slightly thickened appearing, suggestive of nonspecific infectious, inflammatory, or ischemic colitis. Correlate for referable clinical symptoms. 3. There is trace, nonspecific free fluid in the low pelvis, which may be reactive, although would be difficult to distinguish from postoperative and post treatment soft tissue change in the pelvis given history of hysterectomy and rectal cancer. 4. Unchanged prominent retroperitoneal and iliac lymph nodes. No new findings suspicious for metastatic disease in the abdomen or pelvis. 5. Bibasilar pulmonary fibrosis and mediastinal and hilar lymphadenopathy, better evaluated by prior CT chest and consistent with patient diagnosis of sarcoidosis.       04/18/2020 Imaging   Venous Doppler Left:  - No evidence of deep vein thrombosis seen in the left lower extremity, from the common femoral through the popliteal veins.  - No evidence of superficial venous thrombosis in the left lower extremity.  - There is no evidence of venous reflux seen in the left lower extremity.  - Rouleax flow noted in the superficial system.    05/18/2020 PET scan   1. Complex scan with a combination of hypermetabolic granulomatous disease (  sarcoidosis) as well as hypermetabolic metastatic endometrial carcinoma. 2. Hypermetabolic mediastinal hilar lymph  nodes and pulmonary nodularity with bronchovascular thickening is consistent with sarcoidosis. 3. Solitary hypermetabolic nodule in the medial LEFT lower lobe, new from prior with intense metabolic activity, is concerning for a metastatic deposit to the LEFT lower lobe. 4. Ill-defined hypermetabolic soft tissue along the LEFT operator space is most consistent with local recurrence of endometrial carcinoma. There is entrapment of the LEFT ureter by this hypermetabolic soft tissue with mild hydroureter and hydronephrosis on the LEFT. 5. Additionally there appears to be direct extension from the hypermetabolic left operator space soft tissue to the sigmoid colon with there is a well-defined hypermetabolic lesion in the colon. 6. More distant hypermetabolic peritoneal metastasis along the ventral peritoneal surface of the RIGHT abdomen beneath RIGHT rectus abdominus muscle. 7. Hypermetabolic lymph nodes in the porta hepatis are favored related to sarcoidosis.   05/19/2020 Cancer Staging   Staging form: Corpus Uteri - Carcinoma and Carcinosarcoma, AJCC 8th Edition - Clinical stage from 05/19/2020: Stage IVB (rcT1, cN2a, cM1) - Signed by [name], MD on 05/19/2020    05/26/2020 Procedure   Successful placement of a right internal jugular approach power injectable Port-A-Cath. The catheter is ready for immediate use   05/29/2020 - 09/27/2020 Chemotherapy   The patient had carboplatin and taxol for chemotherapy treatment.     07/31/2020 Imaging   1. Asymmetric soft tissue mass extending from the left vaginal cuff has decreased in size in the interval. No residual peritoneal nodularity. Pulmonary metastases are stable with exception of a minimally improved left lower lobe nodule. 2. Stable soft tissue nodule associated with the right rectus abdominus muscle. 3. Interval resolution of left hydronephrosis. 4. Calcified mediastinal and hilar lymph nodes, together with pulmonary parenchymal fibrosis, findings  which are in keeping with patient's known diagnosis of sarcoid. 5. Aortic atherosclerosis (ICD10-I70.0). Coronary artery calcification.   10/27/2020 Imaging   1. Essentially stable appearance of the soft tissue density nodule posteriorly in the left rectus abdominus muscle, currently 1.8 by 1.2 cm, previously 11 mm. 2. Stable calcified adenopathy in the mediastinum and hila, compatible with prior granulomatous disease. 3. Stable small right lower lobe pulmonary nodules. 4. Stable soft tissue density prominence along the left vaginal cuff extending up towards the adnexa, probably therapy related. 5. Other imaging findings of potential clinical significance: Coronary atherosclerosis. Small type 1 hiatal hernia. Stable subpleural reticulation favoring the lung bases, possibly from mild fibrosis. 6. Aortic atherosclerosis.   01/19/2021 Imaging   1. Status post hysterectomy. Interval enlargement of soft tissue adjacent to the left aspect of the vaginal cuff and bladder, measuring approximately 4.3 x 2.9 cm in axial section, previously previously previously 3.7 x 1.8 cm when measured similarly. Extensive soft tissue thickening and stranding throughout the low pelvis. 2. Interval enlargement of a metastatic nodule within the right rectus abdominus. New small peritoneal nodule anterior to the sigmoid colon. 3. Interval enlargement of retroperitoneal retroperitoneal lymph nodes. 4. Multiple small bilateral pulmonary nodules, several of which are new and enlarged. 5. Constellation of findings above is consistent with worsened metastatic disease. 6. New left hydronephrosis and hydroureter, the distal left ureter obstructed by soft tissue near the left aspect of the urinary bladder and vaginal cuff. 7. Unchanged enlarged, partially calcified mediastinal and hilar lymph nodes,, in keeping with prior granulomatous infection or sarcoidosis. 8. Mild underlying pulmonary fibrosis, in a UIP pattern. 9. Coronary  artery disease.   02/02/2021 - 07/13/2021 Chemotherapy  Patient is on carboplatin and docetaxel    04/17/2021 Imaging   1. Interval decrease in size of multiple pulmonary nodules, with resolution of some previously seen nodules. 2. Interval decrease in size of enlarged retroperitoneal lymph nodes. 3. Interval decrease in size of soft tissue at the left aspect of the vaginal cuff status post hysterectomy. 4. Interval decrease in size of a soft tissue nodule within the posterior aspect of the right rectus abdominus muscle body. 5. Constellation of findings is consistent with treatment response of locally recurrent, nodal, and distant metastatic disease. 6. There has been interval resolution of previously seen left hydronephrosis and hydroureter. 7. 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   Stable mild partially calcified mediastinal and bilateral hilar lymphadenopathy.   Stable sub-cm abdominal retroperitoneal lymph nodes.   Aortic Atherosclerosis (ICD10-I70.0).     09/03/2021 -  Chemotherapy   Patient is on Treatment Plan : UTERINE Lenvatinib + Pembrolizumab q21d     11/21/2021 Imaging   Soft tissue nodule in the RIGHT rectus muscle with enhancement (image 79/2) this measures 1.7 x 1.8 is cm not substantially changed compared to prior imaging from August and with enlargement since May of 2022 where it measured approximately 16 x 11 mm.    Signs of hysterectomy and post treatment related changes in the pelvis. No new signs of disease.   Septal thickening and ground-glass in the chest with similar appearance aside from a new area of bandlike changes in the RIGHT upper lobe favored to be post infectious or inflammatory, attention on follow-up.   Stable enlarged mediastinal and bihilar lymph nodes, partially calcified. Patient has reported history of sarcoidosis. Some of the pulmonary  findings above may be related underlying sarcoidosis despite atypical distribution.   Aortic atherosclerosis.   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     02/02/2021 - 07/13/2021 Chemotherapy    Patient is on carboplatin and docetaxel    09/03/2021 -  Chemotherapy   Patient is on Treatment Plan : UTERINE Lenvatinib + Pembrolizumab q21d       Current Therapy: PO Lenvatinib daily  Interval history- [name] is a 65 yo female with oncologic history as stated above presenting [facility] today with sister accompanying her presenting with chief complaint of elevated blood pressure, feeling dizzy and mechanical fall.  Patient states she has been feeling dizzy for the last x1 week.  She describes it as a room spinning sensation when changing positions.  Patient states she had a mechanical fall yesterday.  She was standing in the kitchen turned around and is unsure if she lost her footing or got dizzy causing her to fall to the ground.  She landed on her backside.  She denies hitting her head or loss  of consciousness.  Patient was alone and able to get up without assistance.  She has ambulated since the fall.  She does not use a cane or walker at baseline.  Patient does live alone however family checks on her frequently.  Patient states she has had decreased  appetite and fluid intake for the last several weeks. Patient was taking HCTZ for hypertension in the past however it was held when she developed diarrhea, is still on bisoprolol for hypertension. She recently restarted the HCTZ yesterday per oncologist's recommendation. Patient called oncologist x 5 days ago and report right hand numbness and dizziness when getting up to walk. It was thought to be related to previous chemo and plan was to discuss this further at next office visit. She denies any fever, chills, weight gain, headache, visual changes, chest pain, abdominal pain, nausea, emesis, urinary symptoms, diarrhea, leg swelling.  ROS  All other systems are reviewed and are negative for acute change except as noted in the HPI.    Allergies  Allergen Reactions   Shellfish Allergy Hives, Itching and Swelling   Penicillins Hives and Itching    Has patient had a PCN reaction causing immediate rash, facial/tongue/throat swelling, SOB or lightheadedness with hypotension: Yes Has patient had a PCN reaction causing severe rash involving mucus membranes or skin necrosis: Yes Has patient had a PCN reaction that required hospitalization: Yes Has patient had a PCN reaction occurring within the last 10 years: No If all of the above answers are "NO", then may proceed with Cephalosporin use.      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  ChemoXRT 2005 Dr Collier Salina Rubin/[facility]   Upper airway cough syndrome    Wears glasses      Past Surgical History:  Procedure Laterality Date   BRONCHOSCOPY  01/22/2008   w/ Lavage and bronchial bx   CARDIOVASCULAR STRESS TEST  06/27/2006   normal nuclear study w/ no ischemia/  normal LV function and wall motion , ef 61%   CARPAL TUNNEL RELEASE Right 01/17/2016   and finger trigger release   CLEFT LIP REPAIR     CO2 LASER ABLATION VULVA     EUA/  EXCISIONAL MULTIPLE PERINEAL/ANAL BX'S/  SIGMOIDSCOPY/  PORT-A-CATH PLACEMENT  03/27/2004   Local excision - Dr Lennie Hummer   IR IMAGING GUIDED PORT INSERTION  05/26/2020   LAPAROSCOPIC CHOLECYSTECTOMY  08/14/2008   MULTIPLE ANAL BX'S  10/10/2006   RELEASE A1 PULLEY THUMB Bilateral left 12-17-2006/  right 09-05-2009   REMOVAL PORT-A-CATH/  EUA/  MULTIPLE ANAL BX'S  10/16/2004  ROBOTIC ASSISTED TOTAL HYSTERECTOMY WITH BILATERAL SALPINGO OOPHERECTOMY Bilateral 06/25/2018   Procedure: XI ROBOTIC ASSISTED TOTAL HYSTERECTOMY WITH BILATERAL SALPINGO OOPHORECTOMY;  Surgeon: Everitt Amber, MD;  Location: [facility];  Service: Gynecology;  Laterality: Bilateral;   SENTINEL NODE BIOPSY N/A 06/25/2018   Procedure: SENTINEL NODE BIOPSY;  Surgeon: Everitt Amber, MD;  Location: [facility];  Service: Gynecology;  Laterality: N/A;   TENNIS ELBOW RELEASE/NIRSCHEL PROCEDURE Right 11/28/2016   Procedure: RIGHT ELBOW ULNER NERVE RELEASE AND OR TRANSPOSITION;  Surgeon: Iran Planas, MD;  Location: [facility];  Service: Orthopedics;  Laterality: Right;    Social History   Socioeconomic History   Marital status: Single    Spouse name: Not on file   Number of children: 2   Years of education: Not on file   Highest education level: Not on file  Occupational History    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[employer]  Tobacco Use   Smoking status: Former    Packs/day: 0.50    Years: 1.00    Pack years: 0.50    Types: Cigarettes    Quit date: 12/16/1993    Years since quitting: 27.9   Smokeless tobacco: Never  Vaping Use   Vaping Use: Never used  Substance and Sexual Activity   Alcohol use: Yes    Comment: occasional   Drug use: No   Sexual activity: Not Currently  Other Topics Concern   Not on file  Social History Narrative   Not on file   Social Determinants of Health   Financial Resource Strain: Low Risk    Difficulty of Paying Living Expenses: Not hard at all  Food Insecurity: No Food Insecurity   Worried About Charity fundraiser in the Last Year: Never true   [place] in the Last Year: Never true  Transportation Needs: No Transportation Needs   Lack of Transportation (Medical): No   Lack of Transportation (Non-Medical): No  Physical Activity: Sufficiently Active   Days of Exercise per Week: 3 days   Minutes of Exercise per Session: 60 min  Stress: No Stress Concern Present   Feeling of Stress : Not at all  Social Connections: Not on file  Intimate Partner Violence: Not on file    Family History  Problem Relation Age of Onset   Allergies Mother    Asthma Mother    Lung cancer Mother    Allergies Sister    Allergies Brother    Asthma Brother    Asthma Sister    Sarcoidosis Neg Hx    Colon cancer Neg Hx    Liver cancer Neg Hx    Stomach cancer Neg Hx    Esophageal cancer Neg Hx    Rectal cancer Neg Hx      Current Outpatient Medications:    albuterol (PROAIR HFA) 108 (90 Base) MCG/ACT inhaler, 2 puffs every 4 hours as needed only  if your can't catch your breath, Disp: 18 g, Rfl: 2   amLODipine (NORVASC) 10 MG tablet, , Disp: , Rfl:    bisoprolol (ZEBETA) 10 MG tablet, Take 1 tablet (10 mg total) by mouth in the morning and at bedtime., Disp: 180 tablet, Rfl: 0   desonide (DESOWEN) 0.05 % lotion, Apply  topically 2 (two) times daily. Continue following with dermatologist, Disp: 59 mL, Rfl: 0   dextromethorphan (DELSYM) 30 MG/5ML liquid, Take by mouth as needed for cough. , Disp: , Rfl:    hydrochlorothiazide (HYDRODIURIL) 25 MG  tablet, Take 1 tablet (25 mg total) by mouth daily., Disp: 30 tablet, Rfl: 3   lenvatinib 20 mg daily dose (LENVIMA) 2 x 10 MG capsule, Take 1 capsule (10 mg total) by mouth daily., Disp: 30 capsule, Rfl: 11   levothyroxine (SYNTHROID) 75 MCG tablet, Take 1 tablet (75 mcg total) by mouth daily before breakfast., Disp: 30 tablet, Rfl: 3   lidocaine-prilocaine (EMLA) cream, Apply 1 application topically as needed., Disp: 30 g, Rfl: 3   loratadine (CLARITIN) 10 MG tablet, Take 10 mg by mouth daily as needed for allergies., Disp: , Rfl:    mometasone-formoterol (DULERA) 100-5 MCG/ACT AERO, Take 2 puffs first thing in am and then another 2 puffs about 12 hours later., Disp: 3 each, Rfl: 3   neomycin-polymyxin-hydrocortisone (CORTISPORIN) OTIC solution, Place 3 drops into the left ear 4 (four) times daily., Disp: 10 mL, Rfl: 0   nystatin (MYCOSTATIN/NYSTOP) powder, Apply to affected area 2 (two) times daily., Disp: 60 g, Rfl: 0   ondansetron (ZOFRAN) 8 MG tablet, Take 1 tablet (8 mg total) by mouth every 8 (eight) hours as needed for nausea., Disp: 30 tablet, Rfl: 3   pantoprazole (PROTONIX) 40 MG tablet, Take 1 tablet (40 mg total) by mouth daily. Patient needs appt for further refills., Disp: 90 tablet, Rfl: 0   polyethylene glycol (MIRALAX / GLYCOLAX) 17 g packet, Take 17 g by mouth daily., Disp: , Rfl:    prochlorperazine (COMPAZINE) 10 MG tablet, TAKE 1 TABLET BY MOUTH EVERY 6 HOURS AS NEEDED FOR NAUSEA OR VOMITING, Disp: 60 tablet, Rfl: 3   RABEprazole (ACIPHEX) 20 MG tablet, Take 1 tablet (20 mg total) by mouth daily before breakfast., Disp: 90 tablet, Rfl: 3   spironolactone (ALDACTONE) 25 MG tablet, Take 1 tablet by mouth daily., Disp: 30 tablet, Rfl: 1   triamcinolone  (NASACORT) 55 MCG/ACT AERO nasal inhaler, Place 2 sprays into each nostril daily., Disp: 16.9 mL, Rfl: 12   valACYclovir (VALTREX) 1000 MG tablet, Take 1 tablet (1,000 mg total) by mouth 3 (three) times daily., Disp: 21 tablet, Rfl: 0   vitamin C (ASCORBIC ACID) 500 MG tablet, Take 500 mg by mouth daily., Disp: , Rfl:   PHYSICAL EXAM: ECOG FS:2 - Symptomatic, <50% confined to bed    Vitals:   12/05/21 1308 12/05/21 1314 12/05/21 1554  BP: (!) 175/104 (!) 176/90 (!) 179/98  Pulse: 68  64  Resp: 17  16  Temp: (!) 97.4 F (36.3 C)    TempSrc: Oral    SpO2: 98%  98%   Physical Exam Vitals and nursing note reviewed.  Constitutional:      General: She is not in acute distress.    Appearance: She is not ill-appearing.  HENT:     Head: Normocephalic and atraumatic.     Comments: No tenderness to palpation of skull. No deformities or crepitus noted. No open wounds, abrasions or lacerations.      Right Ear: Tympanic membrane and external ear normal.     Left Ear: Tympanic membrane and external ear normal.     Nose: Nose normal.     Mouth/Throat:     Mouth: Mucous membranes are dry.     Pharynx: Oropharynx is clear.  Eyes:     General: No scleral icterus.       Right eye: No discharge.        Left eye: No discharge.     Extraocular Movements: Extraocular movements intact.     Conjunctiva/sclera: Conjunctivae  normal.     Pupils: Pupils are equal, round, and reactive to light.     Comments: No nystagmus  Neck:     Vascular: No JVD.  Cardiovascular:     Rate and Rhythm: Normal rate and regular rhythm.     Pulses: Normal pulses.          Radial pulses are 2+ on the right side and 2+ on the left side.     Heart sounds: Normal heart sounds.  Pulmonary:     Comments: Lungs clear to auscultation in all fields. Symmetric chest rise. No wheezing, rales, or rhonchi. Abdominal:     Comments: Abdomen is soft, non-distended, and non-tender in all quadrants. No rigidity, no guarding. No  peritoneal signs.  Musculoskeletal:        General: Normal range of motion.     Cervical back: Normal range of motion.     Right lower leg: No edema.     Left lower leg: No edema.     Comments: Compartments soft in all extremities. Moving all extremities without signs of injury. Palpated from head to toe without any bony tenderness.  Skin:    General: Skin is warm and dry.     Capillary Refill: Capillary refill takes less than 2 seconds.     Findings: No bruising.  Neurological:     Mental Status: She is oriented to person, place, and time.     GCS: GCS eye subscore is 4. GCS verbal subscore is 5. GCS motor subscore is 6.     Comments: Speech is clear and goal oriented, follows commands CN III-XII intact, no facial droop Normal strength in upper and lower extremities bilaterally including dorsiflexion and plantar flexion, strong and equal grip strength Sensation normal to light and sharp touch Moves extremities without ataxia, coordination intact Normal finger to nose and rapid alternating movements Normal gait and balance  Psychiatric:        Behavior: Behavior normal.       LABORATORY DATA: I have reviewed the data as listed CBC Latest Ref Rng & Units 12/05/2021 11/22/2021 11/12/2021  WBC 4.0 - 10.5 K/uL 6.1 4.7 4.9  Hemoglobin 12.0 - 15.0 g/dL 11.3(L) 10.2(L) 10.8(L)  Hematocrit 36.0 - 46.0 % 32.2(L) 30.5(L) 31.4(L)  Platelets 150 - 400 K/uL 161 180 145(L)     CMP Latest Ref Rng & Units 12/05/2021 11/22/2021 11/12/2021  Glucose 70 - 99 mg/dL 118(H) 102(H) 112(H)  BUN 8 - 23 mg/dL 11 15 18   Creatinine 0.44 - 1.00 mg/dL 0.95 0.92 1.09(H)  Sodium 135 - 145 mmol/L 139 142 140  Potassium 3.5 - 5.1 mmol/L 3.4(L) 3.7 4.0  Chloride 98 - 111 mmol/L 103 107 105  CO2 22 - 32 mmol/L 26 23 23   Calcium 8.9 - 10.3 mg/dL 9.1 9.3 8.9  Total Protein 6.5 - 8.1 g/dL 7.7 7.4 7.8  Total Bilirubin 0.3 - 1.2 mg/dL 0.8 0.7 0.8  Alkaline Phos 38 - 126 U/L 93 92 100  AST 15 - 41 U/L 27 21 39   ALT 0 - 44 U/L 28 21 37       RADIOGRAPHIC STUDIES: I have personally reviewed the radiological images as listed and agreed with the findings in the report. No images are attached to the encounter. DG Chest 2 View  Result Date: 11/12/2021 CLINICAL DATA:  Fever and chills.  History of endometrial cancer. EXAM: CHEST - 2 VIEW COMPARISON:  07/26/2021 CT.  09/30/2019 chest radiograph. FINDINGS: Hyperinflation.  Right Port-A-Cath  tip at high right atrium. Midline trachea. Normal heart size. Tortuous thoracic aorta. Soft tissue fullness in the hila is likely related to adenopathy when correlated with prior CT. No pleural effusion or pneumothorax. Clear lungs. IMPRESSION: No evidence of pneumonia or explanation for  fevers. Electronically Signed   By: Abigail Miyamoto M.D.   On: 11/12/2021 10:26   CT CHEST ABDOMEN PELVIS W CONTRAST  Result Date: 11/22/2021 CLINICAL DATA:  A 65 year old female presents for evaluation of endometrial carcinoma with restaging evaluation. EXAM: CT CHEST, ABDOMEN, AND PELVIS WITH CONTRAST TECHNIQUE: Multidetector CT imaging of the chest, abdomen and pelvis was performed following the standard protocol during bolus administration of intravenous contrast. CONTRAST:  84m OMNIPAQUE IOHEXOL 350 MG/ML SOLN COMPARISON:  Comparison is made with July 25, 2021. FINDINGS: CT CHEST FINDINGS Cardiovascular: Calcified aortic atherosclerosis and noncalcified plaque in the thoracic aorta. Central pulmonary vasculature is unremarkable. Heart size normal with signs of coronary artery calcification. No substantial pericardial effusion. RIGHT-sided Port-A-Cath terminates at the caval to atrial junction. Mediastinum/Nodes: Enlarged lymph nodes in the chest with added density show no substantial change from previous imaging For instance, a precarinal lymph node 23 mm previously between 23 and 25 mm short axis. Subcarinal lymph node (image 27/2) 20 mm short axis, unchanged. Bi hilar nodal tissue with  calcification similarly stable. No new signs of nodal disease. Esophagus grossly normal. Lungs/Pleura: Mild ground-glass and septal thickening shows a similar appearance to previous imaging. No discrete nodule. No sign of pleural effusion. Minimal bronchiectasis and bronchiolectasis in the RIGHT middle lobe. RIGHT paramediastinal ground-glass is new and bandlike (image 31/4) Musculoskeletal: See below for full musculoskeletal details. CT ABDOMEN PELVIS FINDINGS Hepatobiliary: Post cholecystectomy, no focal hepatic lesion. Portal vein is patent. Pancreas: Normal, without mass, inflammation or ductal dilatation. Spleen: Normal size and contour. Adrenals/Urinary Tract: Adrenal glands are unremarkable. Symmetric renal enhancement. No sign of hydronephrosis. No suspicious renal lesion or perinephric stranding. Urinary bladder is grossly unremarkable. Stomach/Bowel: Small hiatal hernia. No acute gastrointestinal process. The appendix is normal. Vascular/Lymphatic: Aortic atherosclerosis. No sign of aneurysm. Smooth contour of the IVC. There is no gastrohepatic or hepatoduodenal ligament lymphadenopathy. No retroperitoneal or mesenteric lymphadenopathy. No pelvic sidewall lymphadenopathy. Reproductive: Signs of hysterectomy. Fascial thickening is present in the pelvis without substantial change from previous imaging. Urinary bladder is collapsed in the pelvis also without gross change in terms of surrounding fascial thickening presumably a combination of postoperative and post treatment related changes. Other: No ascites. Musculoskeletal: No acute bone finding. No destructive bone process. Spinal degenerative changes. Soft tissue nodule in the RIGHT rectus muscle similar to prior imaging along the posterior margin of the RIGHT rectus IMPRESSION: Soft tissue nodule in the RIGHT rectus muscle with enhancement (image 79/2) this measures 1.7 x 1.8 is cm not substantially changed compared to prior imaging from August and with  enlargement since May of 2022 where it measured approximately 16 x 11 mm. Signs of hysterectomy and post treatment related changes in the pelvis. No new signs of disease. Septal thickening and ground-glass in the chest with similar appearance aside from a new area of bandlike changes in the RIGHT upper lobe favored to be post infectious or inflammatory, attention on follow-up. Stable enlarged mediastinal and bihilar lymph nodes, partially calcified. Patient has reported history of sarcoidosis. Some of the pulmonary findings above may be related underlying sarcoidosis despite atypical distribution. Aortic atherosclerosis. Aortic Atherosclerosis (ICD10-I70.0). Electronically Signed   By: GZetta BillsM.D.   On: 11/22/2021 10:54  ASSESSMENT & PLAN: Patient is a 65 y.o. female with history of recurrent uterine cancer followed by oncologist Dr. Alvy Bimler.   #) Poor fluid intake- Patient appears clinically dehydrated.  Patient given liter of IV fluids here with improvement of symptoms.  Encouraged her to increase fluid intake at home and try boost shakes to help with nutrient support.  #) Essential hypertension-patient's blood pressure is elevated here in clinic. 176/90 and 176/98.  Patient has normal neurologic exam.  I viewed CMP which shows no signs of endorgan damage.  Patient just restarted HCTZ yesterday, recommend she continue to take this to help control blood pressure.  Patient will continue to monitor blood pressure at home and is currently keeping a log of daily measurements.  CBC is without anemia or leukocytosis.  Normal platelets and ANC.  #)acquired hypothyroidism 2/2 treatment- TSH elevated today 8.124.T4 is in process Patient's oncologist is out of the office over the holidays, will send message to inform her of labs today. Patient will continue levothyroxine dose 75 mcg as prescribed for now and will defer to oncologist for medication changes if necessary depending on T4 and future labs.  This is likely contributing to her fatigue, encouraged patient to closely monitor symptoms.    #)Mechanical fall-  Patient without signs of trauma on exam. Moving all extremities without sings of injury. She is alert and oriented. Did not hit her head and was able to get herself up. Engaged in shared decision making with patient and sister who agree with plan for not needing head CT at this time.  #)Uterine cancer- Next appointment with oncologist is 12/21/2021, recommend she keep that as scheduled. Treatment to continue per oncologist.  Discussed ED precautions with patient should symptoms worsen or anything new develops.   Visit Diagnosis: 1. Poor fluid intake   2. Acquired hypothyroidism   3. Essential hypertension   4. Endometrial cancer ([place])   5. Fall, initial encounter      Orders Placed This Encounter  Procedures   Total Protein, Urine dipstick    All questions were answered. The patient knows to call the clinic with any problems, questions or concerns. No barriers to learning was detected.  I have spent a total of 30 minutes minutes of face-to-face and non-face-to-face time, preparing to see the patient, obtaining and/or reviewing separately obtained history, performing a medically appropriate examination, counseling and educating the patient, ordering tests,  documenting clinical information in the electronic health record, and care coordination.     Thank you for allowing me to participate in the care of this patient.    Barrie Folk, PA-C Department of Hematology/Oncology [facility] Phone: [phone]  [phone]    12/05/2021 4:14 PM

## 2021-12-05 NOTE — Telephone Encounter (Signed)
[name] (sister) called and emailed saying that Lajeana has been dizzy and unsteady.  She did fall yesterday but did not have any injuries.  She is concerned about Chaundra staying by herself and also said her color is off but wasn't sure if it was due to seeing her on Facetime or if her oxygen sat was low.  Charleen's bp last night was 178/88.  Asked if they would like to see the symptom management PA if available and [name] said she would like an appointment.

## 2021-12-05 NOTE — Telephone Encounter (Signed)
Called [name] back and scheduled labs for 12:30 and appointment with Symptom Management at 1:00.  She verbalized understanding and agreement of appointments.

## 2021-12-06 LAB — T4: T4, Total: 8.4 ug/dL (ref 4.5–12.0)

## 2021-12-11 ENCOUNTER — Encounter: Payer: Self-pay | Admitting: Hematology and Oncology

## 2021-12-12 ENCOUNTER — Other Ambulatory Visit (HOSPITAL_COMMUNITY): Payer: Self-pay

## 2021-12-12 ENCOUNTER — Telehealth: Payer: Self-pay

## 2021-12-12 ENCOUNTER — Other Ambulatory Visit: Payer: Self-pay

## 2021-12-12 MED ORDER — BISOPROLOL FUMARATE 10 MG PO TABS
10.0000 mg | ORAL_TABLET | Freq: Two times a day (BID) | ORAL | 1 refills | Status: DC
Start: 2021-12-12 — End: 2022-04-25
  Filled 2021-12-12: qty 60, 30d supply, fill #0
  Filled 2022-03-14: qty 60, 30d supply, fill #1

## 2021-12-12 NOTE — Telephone Encounter (Signed)
-----   Message from [name], MD sent at 12/12/2021  9:49 AM EST ----- She was seen recently at [facility] for dehydration Can you call and check on her? Also BP?

## 2021-12-12 NOTE — Telephone Encounter (Signed)
Called  and given below message. She is feeling a little better. Still having dizziness when she gets up. C/o of ear popping and she has finished up ear drop Rx. Her daughter and son are staying with her. Drinking and eating with no problems. She does not feel dehydrated.  Requesting Rx for a rolling walker. Rx sent for Bisoprolol to pharmacy.  She has checked her BP today. 12/27 am bp 194/100 and pm 141/70 12/26 am bp 134/80 and pm 138/80. She does not have anymore readings.

## 2021-12-13 ENCOUNTER — Encounter: Payer: Self-pay | Admitting: Hematology and Oncology

## 2021-12-13 NOTE — Telephone Encounter (Signed)
I will give her prescription for walker when I see her She needs to check BP twice daily Please get her to bring all pill bottles in her next visit

## 2021-12-13 NOTE — Telephone Encounter (Signed)
Called and given below message. She verbalized understanding. 

## 2021-12-18 ENCOUNTER — Telehealth: Payer: Self-pay

## 2021-12-18 NOTE — Telephone Encounter (Signed)
Called regarding call to the after hours over the weekend. She was having diarrhea that started Saturday after going to the beach. She started taking more imodium, up to 8 tabs yesterday. Denies diarrhea today and feeling better today. She is back home and feeling better. She drinking Gatorade and drinking lots of fluids. Able to eat with no problems. Reminded her of appts 1/6 and bring medication bottles. She has not checked her bp today. Yesterday her bp was 134/70 and has been in that same range when she checks it. Reminded to check bp BID. She verbalized understanding.

## 2021-12-19 ENCOUNTER — Encounter: Payer: Self-pay | Admitting: Hematology and Oncology

## 2021-12-21 ENCOUNTER — Inpatient Hospital Stay: Payer: Medicare HMO

## 2021-12-21 ENCOUNTER — Other Ambulatory Visit (HOSPITAL_COMMUNITY): Payer: Self-pay

## 2021-12-21 ENCOUNTER — Other Ambulatory Visit: Payer: Self-pay | Admitting: Hematology and Oncology

## 2021-12-21 ENCOUNTER — Inpatient Hospital Stay: Payer: Medicare HMO | Attending: Hematology and Oncology

## 2021-12-21 ENCOUNTER — Inpatient Hospital Stay (HOSPITAL_BASED_OUTPATIENT_CLINIC_OR_DEPARTMENT_OTHER): Payer: Medicare HMO | Admitting: Hematology and Oncology

## 2021-12-21 ENCOUNTER — Other Ambulatory Visit: Payer: Self-pay

## 2021-12-21 ENCOUNTER — Encounter: Payer: Self-pay | Admitting: Hematology and Oncology

## 2021-12-21 VITALS — BP 165/89

## 2021-12-21 DIAGNOSIS — E039 Hypothyroidism, unspecified: Secondary | ICD-10-CM

## 2021-12-21 DIAGNOSIS — C541 Malignant neoplasm of endometrium: Secondary | ICD-10-CM | POA: Diagnosis not present

## 2021-12-21 DIAGNOSIS — Z5112 Encounter for antineoplastic immunotherapy: Secondary | ICD-10-CM | POA: Diagnosis not present

## 2021-12-21 DIAGNOSIS — I10 Essential (primary) hypertension: Secondary | ICD-10-CM

## 2021-12-21 DIAGNOSIS — Z79899 Other long term (current) drug therapy: Secondary | ICD-10-CM | POA: Diagnosis not present

## 2021-12-21 DIAGNOSIS — R197 Diarrhea, unspecified: Secondary | ICD-10-CM | POA: Diagnosis not present

## 2021-12-21 DIAGNOSIS — C775 Secondary and unspecified malignant neoplasm of intrapelvic lymph nodes: Secondary | ICD-10-CM | POA: Insufficient documentation

## 2021-12-21 DIAGNOSIS — R5381 Other malaise: Secondary | ICD-10-CM

## 2021-12-21 DIAGNOSIS — C7802 Secondary malignant neoplasm of left lung: Secondary | ICD-10-CM | POA: Diagnosis not present

## 2021-12-21 DIAGNOSIS — E860 Dehydration: Secondary | ICD-10-CM

## 2021-12-21 DIAGNOSIS — R531 Weakness: Secondary | ICD-10-CM | POA: Insufficient documentation

## 2021-12-21 LAB — CMP (CANCER CENTER ONLY)
ALT: 26 U/L (ref 0–44)
AST: 23 U/L (ref 15–41)
Albumin: 3.9 g/dL (ref 3.5–5.0)
Alkaline Phosphatase: 111 U/L (ref 38–126)
Anion gap: 9 (ref 5–15)
BUN: 14 mg/dL (ref 8–23)
CO2: 25 mmol/L (ref 22–32)
Calcium: 9 mg/dL (ref 8.9–10.3)
Chloride: 106 mmol/L (ref 98–111)
Creatinine: 0.97 mg/dL (ref 0.44–1.00)
GFR, Estimated: >60 mL/min (ref >60)
Glucose, Bld: 111 mg/dL — ABNORMAL HIGH (ref 70–99)
Potassium: 4.1 mmol/L (ref 3.5–5.1)
Sodium: 140 mmol/L (ref 135–145)
Total Bilirubin: 0.6 mg/dL (ref 0.3–1.2)
Total Protein: 7.7 g/dL (ref 6.5–8.1)

## 2021-12-21 LAB — CBC WITH DIFFERENTIAL (CANCER CENTER ONLY)
Abs Immature Granulocytes: 0.02 10*3/uL (ref 0.00–0.07)
Basophils Absolute: 0 10*3/uL (ref 0.0–0.1)
Basophils Relative: 0 %
Eosinophils Absolute: 0.2 10*3/uL (ref 0.0–0.5)
Eosinophils Relative: 4 %
HCT: 31.2 % — ABNORMAL LOW (ref 36.0–46.0)
Hemoglobin: 10.8 g/dL — ABNORMAL LOW (ref 12.0–15.0)
Immature Granulocytes: 0 %
Lymphocytes Relative: 19 %
Lymphs Abs: 1.2 10*3/uL (ref 0.7–4.0)
MCH: 33.1 pg (ref 26.0–34.0)
MCHC: 34.6 g/dL (ref 30.0–36.0)
MCV: 95.7 fL (ref 80.0–100.0)
Monocytes Absolute: 0.6 10*3/uL (ref 0.1–1.0)
Monocytes Relative: 10 %
Neutro Abs: 4.1 10*3/uL (ref 1.7–7.7)
Neutrophils Relative %: 67 %
Platelet Count: 176 10*3/uL (ref 150–400)
RBC: 3.26 MIL/uL — ABNORMAL LOW (ref 3.87–5.11)
RDW: 16.2 % — ABNORMAL HIGH (ref 11.5–15.5)
WBC Count: 6.2 10*3/uL (ref 4.0–10.5)
nRBC: 0 % (ref 0.0–0.2)

## 2021-12-21 LAB — TOTAL PROTEIN, URINE DIPSTICK: Protein, ur: 30 mg/dL — AB

## 2021-12-21 LAB — TSH: TSH: 8.643 u[IU]/mL — ABNORMAL HIGH (ref 0.308–3.960)

## 2021-12-21 MED ORDER — SODIUM CHLORIDE 0.9 % IV SOLN: Freq: Once | INTRAVENOUS | Status: DC

## 2021-12-21 MED ORDER — HEPARIN SOD (PORK) LOCK FLUSH 100 UNIT/ML IV SOLN
500.0000 [IU] | Freq: Once | INTRAVENOUS | Status: AC | PRN
  Administered 2021-12-21: 500 [IU]

## 2021-12-21 MED ORDER — SODIUM CHLORIDE 0.9 % IV SOLN: Freq: Once | INTRAVENOUS | Status: AC

## 2021-12-21 MED ORDER — ONDANSETRON HCL 4 MG/2ML IJ SOLN: 8.0000 mg | Freq: Once | INTRAMUSCULAR | Status: DC

## 2021-12-21 MED ORDER — LEVOTHYROXINE SODIUM 100 MCG PO TABS
100.0000 ug | ORAL_TABLET | Freq: Every day | ORAL | 3 refills | Status: AC
Start: 2021-12-21 — End: ?
  Filled 2021-12-21: qty 30, 30d supply, fill #0
  Filled 2022-01-30: qty 30, 30d supply, fill #1
  Filled 2022-03-14: qty 30, 30d supply, fill #2
  Filled 2022-04-16: qty 30, 30d supply, fill #3

## 2021-12-21 MED ORDER — SODIUM CHLORIDE 0.9% FLUSH
10.0000 mL | Freq: Once | INTRAVENOUS | Status: AC
  Administered 2021-12-21: 10 mL

## 2021-12-21 MED ORDER — SODIUM CHLORIDE 0.9% FLUSH
10.0000 mL | INTRAVENOUS | Status: DC | PRN
  Administered 2021-12-21: 10 mL

## 2021-12-21 MED ORDER — SODIUM CHLORIDE 0.9 % IV SOLN
200.0000 mg | Freq: Once | INTRAVENOUS | Status: AC
  Administered 2021-12-21: 200 mg via INTRAVENOUS
  Filled 2021-12-21: qty 8

## 2021-12-21 NOTE — Assessment & Plan Note (Signed)
Her TSH remains elevated and she has fatigue I plan to increase the dose of her Synthroid

## 2021-12-21 NOTE — Progress Notes (Signed)
[place] OFFICE PROGRESS NOTE  Patient Care Team: [name], [name], MD as PCP - General (Family Medicine) Awanda Mink Craige Cotta, RN as Oncology Nurse Navigator (Oncology)  ASSESSMENT & PLAN:  Endometrial cancer [facility]) I have extensive discussion with the patient and family I went through all her prescription pills More than half of her pills are either expired or no longer actively used There were multiple duplication of medications Her imaging studies suggest that she responded well to treatment but the patient is not able to function due to multiple different complications that may or may not be directly related to the treatment I plan aggressive supportive care I plan to have virtual visit with her every week to manage her symptoms  Essential hypertension She has intermittent, uncontrolled hypertension Her blood pressure fluctuated significantly between 1 extreme to another Today, her systolic blood pressure is 185 but 2 days ago, her blood pressure was normal I suspect her chronic dizziness is related to wide fluctuation of blood pressure and dehydration The patient is instructed to weigh herself daily and to document blood pressure with heart rate twice daily I will review blood pressure control with her with virtual visit next week  Dehydration She is prone to dehydration Her oral intake is very poor I recommend the patient to aim for 90 ounces of liquid per day Family members is wondering about IV fluid hydration on a regular basis There is no barrier for the patient to drink water except she appears nonmotivated We have discussed various strategies to encourage her and to document oral fluid intake We will try that first  Physical debility She is very debilitated and functionally limited She has shortness of breath on minimal exertion Her lifestyle is very sedentary We discussed importance of simple exercises I do recommend outpatient physical therapy and rehab  but at her current state, she would not qualify due to her poor mobility I will give the patient a chance to get her oral intake improved, get better control of blood pressure and frequent mobility at home When she is stronger, we will refer her for outpatient physical therapy and rehab  Diarrhea The cause is unknown I recommend documentation of oral intake She will continue Imodium as needed  Acquired hypothyroidism Her TSH remains elevated and she has fatigue I plan to increase the dose of her Synthroid  No orders of the defined types were placed in this encounter.   All questions were answered. The patient knows to call the clinic with any problems, questions or concerns. The total time spent in the appointment was 40 minutes encounter with patients including review of chart and various tests results, discussions about plan of care and coordination of care plan   [name], MD 12/21/2021 4:44 PM  INTERVAL HISTORY: Please see below for problem oriented charting. she returns for treatment follow-up with her family She brought with her a medicine box that contained approximately 20 different pill bottles of which half of them were not used She had multiple duplication of hydrochlorothiazide in her pill bottles She complain of intermittent dizziness She has intermittent diarrhea Her blood pressure widely fluctuated between high to within normal range She had to receive IV fluids recently due to dehydration She is not able to quantify her oral intake She complain of profound weakness and is sedentary at home No recent falls  REVIEW OF SYSTEMS:   Constitutional: Denies fevers, chills or abnormal weight loss Eyes: Denies blurriness of vision Ears, nose, mouth,  throat, and face: Denies mucositis or sore throat Cardiovascular: Denies palpitation, chest discomfort  Skin: Denies abnormal skin rashes Lymphatics: Denies new lymphadenopathy or easy bruising Behavioral/Psych: Mood is  stable, no new changes  All other systems were reviewed with the patient and are negative.  I have reviewed the past medical history, past surgical history, social history and family history with the patient and they are unchanged from previous note.  ALLERGIES:  is allergic to shellfish allergy and penicillins.  MEDICATIONS:  Current Outpatient Medications  Medication Sig Dispense Refill   diphenhydrAMINE (BENADRYL) 25 MG tablet Take 25 mg by mouth every 6 (six) hours as needed.     albuterol (PROAIR HFA) 108 (90 Base) MCG/ACT inhaler 2 puffs every 4 hours as needed only  if your can't catch your breath 18 g 2   bisoprolol (ZEBETA) 10 MG tablet Take 1 tablet (10 mg total) by mouth in the morning and at bedtime. 60 tablet 1   desonide (DESOWEN) 0.05 % lotion Apply topically 2 (two) times daily. Continue following with dermatologist 59 mL 0   hydrochlorothiazide (HYDRODIURIL) 25 MG tablet Take 1 tablet (25 mg total) by mouth daily. 30 tablet 3   lenvatinib 20 mg daily dose (LENVIMA) 2 x 10 MG capsule Take 1 capsule (10 mg total) by mouth daily. 30 capsule 11   levothyroxine (SYNTHROID) 100 MCG tablet Take 1 tablet (100 mcg total) by mouth daily before breakfast. 30 tablet 3   lidocaine-prilocaine (EMLA) cream Apply 1 application topically as needed. 30 g 3   loratadine (CLARITIN) 10 MG tablet Take 10 mg by mouth daily as needed for allergies.     mometasone-formoterol (DULERA) 100-5 MCG/ACT AERO Take 2 puffs first thing in am and then another 2 puffs about 12 hours later. 3 each 3   nystatin (MYCOSTATIN/NYSTOP) powder Apply to affected area 2 (two) times daily. 60 g 0   ondansetron (ZOFRAN) 8 MG tablet Take 1 tablet (8 mg total) by mouth every 8 (eight) hours as needed for nausea. 30 tablet 3   prochlorperazine (COMPAZINE) 10 MG tablet TAKE 1 TABLET BY MOUTH EVERY 6 HOURS AS NEEDED FOR NAUSEA OR VOMITING 60 tablet 3   triamcinolone (NASACORT) 55 MCG/ACT AERO nasal inhaler Place 2 sprays into  each nostril daily. 16.9 mL 12   vitamin C (ASCORBIC ACID) 500 MG tablet Take 500 mg by mouth daily.     No current facility-administered medications for this visit.   Facility-Administered Medications Ordered in Other Visits  Medication Dose Route Frequency Provider Last Rate Last Admin   ondansetron (ZOFRAN) injection 8 mg  8 mg Intravenous Once Alvy Bimler, Maryam Feely, MD       sodium chloride flush (NS) 0.9 % injection 10 mL  10 mL Intracatheter PRN Alvy Bimler, Benjiman Sedgwick, MD   10 mL at 12/21/21 1519    SUMMARY OF ONCOLOGIC HISTORY: Oncology History Overview Note  ER/PR neg, MSI stable   Endometrial cancer ([place])  06/15/2018 Surgery   Surgeon: Donaciano Eva     Operation: Robotic-assisted laparoscopic total hysterectomy with bilateral salpingoophorectomy, SLN biopsy     Operative Findings:  : 6cm uterus, normal tubes and ovaries, omentum adherent to the anterior abdominal wall and uterine fundus. No suspicious nodes.   06/15/2018 Pathology Results   Procedure: Total hysterectomy with bilateral salpingo-oophorectomy. Bilateral obturator sentinel lymph node biopsies. Histologic type: Endometrioid adenocarcinoma. Histologic Grade: FIGO grade 1. Myometrial invasion: Depth of invasion: 15 mm Myometrial thickness: 20 mm Uterine Serosa Involvement: Not identified. Cervical  stromal involvement: Not identified. Extent of involvement of other organs: Uninvolved. Lymphovascular invasion: Present. Regional Lymph Nodes: Examined: 2 Sentinel 0 Non-sentinel 2 Total Lymph nodes with metastasis: 0 Isolated tumor cells (< 0.2 mm): 2 Micrometastasis: (> 0.2 mm and < 2.0 mm): 0 Macrometastasis: (> 2.0 mm): 0 Extracapsular extension: N/A. Tumor block for ancillary studies: 4Q-S. MMR / MSI testing: Will be ordered. Pathologic Stage Classification (pTNM, AJCC 8th edition): pT1b, pN0(i+)   07/20/2018 Imaging   1. Surgical changes from recent hysterectomy. No findings suspicious for residual tumor or adenopathy  in the pelvis. 2. No CT findings to suggest omental, peritoneal surface or solid organ metastatic disease in the abdomen. 3. Changes consistent with known sarcoidosis in the chest with bulky calcified mediastinal and hilar lymph nodes and interstitial lung disease. I do not see any definite CT findings to suggest metastatic disease involving the chest.   08/19/2018 - 09/17/2018 Radiation Therapy   08/19/18 - 09/17/18: Vaginal Cuff, 3 cm cylinder with treatment length of 3 cm / 30 Gy delivered in 5 fractions of 6 Gy, Brachytherapy / HDR, Iridium-192   05/2005: Left Chest Wall keloid   04/2004 - 05/2004: Anus, pelvis /inguinal ( Dr. Tammi Klippel)   01/19/2020 Imaging   1. No definite CT findings of the abdomen or pelvis to explain pain. 2. The sigmoid colon and rectum are decompressed although slightly thickened appearing, suggestive of nonspecific infectious, inflammatory, or ischemic colitis. Correlate for referable clinical symptoms. 3. There is trace, nonspecific free fluid in the low pelvis, which may be reactive, although would be difficult to distinguish from postoperative and post treatment soft tissue change in the pelvis given history of hysterectomy and rectal cancer. 4. Unchanged prominent retroperitoneal and iliac lymph nodes. No new findings suspicious for metastatic disease in the abdomen or pelvis. 5. Bibasilar pulmonary fibrosis and mediastinal and hilar lymphadenopathy, better evaluated by prior CT chest and consistent with patient diagnosis of sarcoidosis.       04/18/2020 Imaging   Venous Doppler Left:  - No evidence of deep vein thrombosis seen in the left lower extremity, from the common femoral through the popliteal veins.  - No evidence of superficial venous thrombosis in the left lower extremity.  - There is no evidence of venous reflux seen in the left lower extremity.  - Rouleax flow noted in the superficial system.    05/18/2020 PET scan   1. Complex scan with a combination of  hypermetabolic granulomatous disease (sarcoidosis) as well as hypermetabolic metastatic endometrial carcinoma. 2. Hypermetabolic mediastinal hilar lymph nodes and pulmonary nodularity with bronchovascular thickening is consistent with sarcoidosis. 3. Solitary hypermetabolic nodule in the medial LEFT lower lobe, new from prior with intense metabolic activity, is concerning for a metastatic deposit to the LEFT lower lobe. 4. Ill-defined hypermetabolic soft tissue along the LEFT operator space is most consistent with local recurrence of endometrial carcinoma. There is entrapment of the LEFT ureter by this hypermetabolic soft tissue with mild hydroureter and hydronephrosis on the LEFT. 5. Additionally there appears to be direct extension from the hypermetabolic left operator space soft tissue to the sigmoid colon with there is a well-defined hypermetabolic lesion in the colon. 6. More distant hypermetabolic peritoneal metastasis along the ventral peritoneal surface of the RIGHT abdomen beneath RIGHT rectus abdominus muscle. 7. Hypermetabolic lymph nodes in the porta hepatis are favored related to sarcoidosis.   05/19/2020 Cancer Staging   Staging form: Corpus Uteri - Carcinoma and Carcinosarcoma, AJCC 8th Edition - Clinical stage from 05/19/2020:  Stage IVB (rcT1, cN2a, cM1) - Signed by [name], MD on 05/19/2020    05/26/2020 Procedure   Successful placement of a right internal jugular approach power injectable Port-A-Cath. The catheter is ready for immediate use   05/29/2020 - 09/27/2020 Chemotherapy   The patient had carboplatin and taxol for chemotherapy treatment.     07/31/2020 Imaging   1. Asymmetric soft tissue mass extending from the left vaginal cuff has decreased in size in the interval. No residual peritoneal nodularity. Pulmonary metastases are stable with exception of a minimally improved left lower lobe nodule. 2. Stable soft tissue nodule associated with the right rectus abdominus  muscle. 3. Interval resolution of left hydronephrosis. 4. Calcified mediastinal and hilar lymph nodes, together with pulmonary parenchymal fibrosis, findings which are in keeping with patient's known diagnosis of sarcoid. 5. Aortic atherosclerosis (ICD10-I70.0). Coronary artery calcification.   10/27/2020 Imaging   1. Essentially stable appearance of the soft tissue density nodule posteriorly in the left rectus abdominus muscle, currently 1.8 by 1.2 cm, previously 11 mm. 2. Stable calcified adenopathy in the mediastinum and hila, compatible with prior granulomatous disease. 3. Stable small right lower lobe pulmonary nodules. 4. Stable soft tissue density prominence along the left vaginal cuff extending up towards the adnexa, probably therapy related. 5. Other imaging findings of potential clinical significance: Coronary atherosclerosis. Small type 1 hiatal hernia. Stable subpleural reticulation favoring the lung bases, possibly from mild fibrosis. 6. Aortic atherosclerosis.   01/19/2021 Imaging   1. Status post hysterectomy. Interval enlargement of soft tissue adjacent to the left aspect of the vaginal cuff and bladder, measuring approximately 4.3 x 2.9 cm in axial section, previously previously previously 3.7 x 1.8 cm when measured similarly. Extensive soft tissue thickening and stranding throughout the low pelvis. 2. Interval enlargement of a metastatic nodule within the right rectus abdominus. New small peritoneal nodule anterior to the sigmoid colon. 3. Interval enlargement of retroperitoneal retroperitoneal lymph nodes. 4. Multiple small bilateral pulmonary nodules, several of which are new and enlarged. 5. Constellation of findings above is consistent with worsened metastatic disease. 6. New left hydronephrosis and hydroureter, the distal left ureter obstructed by soft tissue near the left aspect of the urinary bladder and vaginal cuff. 7. Unchanged enlarged, partially calcified mediastinal  and hilar lymph nodes,, in keeping with prior granulomatous infection or sarcoidosis. 8. Mild underlying pulmonary fibrosis, in a UIP pattern. 9. Coronary artery disease.   02/02/2021 - 07/13/2021 Chemotherapy    Patient is on carboplatin and docetaxel    04/17/2021 Imaging   1. Interval decrease in size of multiple pulmonary nodules, with resolution of some previously seen nodules. 2. Interval decrease in size of enlarged retroperitoneal lymph nodes. 3. Interval decrease in size of soft tissue at the left aspect of the vaginal cuff status post hysterectomy. 4. Interval decrease in size of a soft tissue nodule within the posterior aspect of the right rectus abdominus muscle body. 5. Constellation of findings is consistent with treatment response of locally recurrent, nodal, and distant metastatic disease. 6. There has been interval resolution of previously seen left hydronephrosis and hydroureter. 7. 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   Stable mild partially calcified mediastinal and bilateral hilar lymphadenopathy.   Stable sub-cm abdominal retroperitoneal lymph nodes.   Aortic Atherosclerosis (ICD10-I70.0).     09/03/2021 -  Chemotherapy   Patient is on Treatment Plan : UTERINE Lenvatinib +  Pembrolizumab q21d     11/21/2021 Imaging   Soft tissue nodule in the RIGHT rectus muscle with enhancement (image 79/2) this measures 1.7 x 1.8 is cm not substantially changed compared to prior imaging from August and with enlargement since May of 2022 where it measured approximately 16 x 11 mm.    Signs of hysterectomy and post treatment related changes in the pelvis. No new signs of disease.   Septal thickening and ground-glass in the chest with similar appearance aside from a new area of bandlike changes in the RIGHT upper lobe favored to be post infectious or inflammatory, attention on  follow-up.   Stable enlarged mediastinal and bihilar lymph nodes, partially calcified. Patient has reported history of sarcoidosis. Some of the pulmonary findings above may be related underlying sarcoidosis despite atypical distribution.   Aortic atherosclerosis.   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     02/02/2021 - 07/13/2021 Chemotherapy    Patient is on carboplatin and docetaxel    09/03/2021 -  Chemotherapy   Patient is on Treatment Plan : UTERINE Lenvatinib + Pembrolizumab q21d       PHYSICAL EXAMINATION: ECOG PERFORMANCE STATUS: 2 - Symptomatic, <50% confined to bed  Vitals:   12/21/21 1140  BP: (!) 185/85  Pulse: 67  Resp: 18  Temp: 98.8 F (37.1 C)  SpO2: 97%   Filed Weights   12/21/21 1140  Weight: 202 lb 4.8 oz (91.8 kg)    GENERAL:alert, no distress and comfortable SKIN: skin color, texture, turgor are normal, no rashes or significant lesions EYES: normal, Conjunctiva are pink and non-injected, sclera clear OROPHARYNX:no exudate, no erythema and lips, buccal mucosa, and tongue normal  NECK: supple, thyroid normal size, non-tender, without nodularity LYMPH:  no palpable lymphadenopathy in the cervical, axillary or inguinal LUNGS: clear to auscultation and percussion with normal breathing effort HEART: regular rate &  rhythm and no murmurs with moderate lower extremity edema ABDOMEN:abdomen soft, non-tender and normal bowel sounds Musculoskeletal:no cyanosis of digits and no clubbing  NEURO: alert & oriented x 3 with fluent speech, no focal motor/sensory deficits  LABORATORY DATA:  I have reviewed the data as listed    Component Value Date/Time   NA 140 12/21/2021 1059   K 4.1 12/21/2021 1059   CL 106 12/21/2021 1059   CO2 25 12/21/2021 1059   GLUCOSE 111 (H) 12/21/2021 1059   BUN 14 12/21/2021 1059   CREATININE 0.97 12/21/2021 1059   CALCIUM 9.0 12/21/2021 1059   PROT 7.7 12/21/2021 1059   ALBUMIN 3.9 12/21/2021 1059   AST 23 12/21/2021 1059   ALT 26 12/21/2021 1059   ALKPHOS 111 12/21/2021 1059   BILITOT 0.6 12/21/2021 1059   GFRNONAA >60 12/21/2021 1059   GFRAA >60 09/08/2020 0819    No results found for: SPEP, UPEP  Lab Results  Component Value Date  WBC 6.2 12/21/2021   NEUTROABS 4.1 12/21/2021   HGB 10.8 (L) 12/21/2021   HCT 31.2 (L) 12/21/2021   MCV 95.7 12/21/2021   PLT 176 12/21/2021      Chemistry      Component Value Date/Time   NA 140 12/21/2021 1059   K 4.1 12/21/2021 1059   CL 106 12/21/2021 1059   CO2 25 12/21/2021 1059   BUN 14 12/21/2021 1059   CREATININE 0.97 12/21/2021 1059      Component Value Date/Time   CALCIUM 9.0 12/21/2021 1059   ALKPHOS 111 12/21/2021 1059   AST 23 12/21/2021 1059   ALT 26 12/21/2021 1059   BILITOT 0.6 12/21/2021 1059

## 2021-12-21 NOTE — Assessment & Plan Note (Signed)
The cause is unknown I recommend documentation of oral intake She will continue Imodium as needed

## 2021-12-21 NOTE — Assessment & Plan Note (Signed)
She has intermittent, uncontrolled hypertension Her blood pressure fluctuated significantly between 1 extreme to another Today, her systolic blood pressure is 185 but 2 days ago, her blood pressure was normal I suspect her chronic dizziness is related to wide fluctuation of blood pressure and dehydration The patient is instructed to weigh herself daily and to document blood pressure with heart rate twice daily I will review blood pressure control with her with virtual visit next week

## 2021-12-21 NOTE — Patient Instructions (Addendum)
[facility]  Discharge Instructions: Thank you for choosing [facility] to provide your oncology and hematology care.   If you have a lab appointment with the [facility], please go directly to the [facility] and check in at the registration area.   Wear comfortable clothing and clothing appropriate for easy access to any Portacath or PICC line.   We strive to give you quality time with your provider. You may need to reschedule your appointment if you arrive late (15 or more minutes).  Arriving late affects you and other patients whose appointments are after yours.  Also, if you miss three or more appointments without notifying the office, you may be dismissed from the clinic at the providers discretion.      For prescription refill requests, have your pharmacy contact our office and allow 72 hours for refills to be completed.    Today you received the following chemotherapy and/or immunotherapy agent: Pembrolizumab (Keytruda).     To help prevent nausea and vomiting after your treatment, we encourage you to take your nausea medication as directed.  BELOW ARE SYMPTOMS THAT SHOULD BE REPORTED IMMEDIATELY: *FEVER GREATER THAN 100.4 F (38 C) OR HIGHER *CHILLS OR SWEATING *NAUSEA AND VOMITING THAT IS NOT CONTROLLED WITH YOUR NAUSEA MEDICATION *UNUSUAL SHORTNESS OF BREATH *UNUSUAL BRUISING OR BLEEDING *URINARY PROBLEMS (pain or burning when urinating, or frequent urination) *BOWEL PROBLEMS (unusual diarrhea, constipation, pain near the anus) TENDERNESS IN MOUTH AND THROAT WITH OR WITHOUT PRESENCE OF ULCERS (sore throat, sores in mouth, or a toothache) UNUSUAL RASH, SWELLING OR PAIN  UNUSUAL VAGINAL DISCHARGE OR ITCHING   Items with * indicate a potential emergency and should be followed up as soon as possible or go to the Emergency Department if any problems should occur.  Please show the CHEMOTHERAPY ALERT CARD or IMMUNOTHERAPY ALERT CARD  at check-in to the Emergency Department and triage nurse.  Should you have questions after your visit or need to cancel or reschedule your appointment, please contact [facility]  Dept: [phone]  and follow the prompts.  Office hours are 8:00 a.m. to 4:30 p.m. Monday - Friday. Please note that voicemails left after 4:00 p.m. may not be returned until the following business day.  We are closed weekends and major holidays. You have access to a nurse at all times for urgent questions. Please call the main number to the clinic Dept: [phone] and follow the prompts.   For any non-urgent questions, you may also contact your provider using MyChart. We now offer e-Visits for anyone 69 and older to request care online for non-urgent symptoms. For details visit [email or web address].   Also download the MyChart app! Go to the app store, search "MyChart", open the app, select [facility], and log in with your MyChart username and password.  Due to Covid, a mask is required upon entering the hospital/clinic. If you do not have a mask, one will be given to you upon arrival. For doctor visits, patients may have 1 support person aged 67 or older with them. For treatment visits, patients cannot have anyone with them due to current Covid guidelines and our immunocompromised population.   Rehydration, Adult Rehydration is the replacement of body fluids, salts, and minerals (electrolytes) that are lost during dehydration. Dehydration is when there is not enough water or other fluids in the body. This happens when you lose more fluids than you take in. Common causes of dehydration include: Not  drinking enough fluids. This can occur when you are ill or doing activities that require a lot of energy, especially in hot weather. Conditions that cause loss of water or other fluids, such as diarrhea, vomiting, sweating, or urinating a lot. Other illnesses, such as fever or  infection. Certain medicines, such as those that remove excess fluid from the body (diuretics). Symptoms of mild or moderate dehydration may include thirst, dry lips and mouth, and dizziness. Symptoms of severe dehydration may include increased heart rate, confusion, fainting, and not urinating. For severe dehydration, you may need to get fluids through an IV at the hospital. For mild or moderate dehydration, you can usually rehydrate at home by drinking certain fluids as told by your health care provider. What are the risks? Generally, rehydration is safe. However, taking in too much fluid (overhydration) can be a problem. This is rare. Overhydration can cause an electrolyte imbalance, kidney failure, or a decrease in salt (sodium) levels in the body. Supplies needed You will need an oral rehydration solution (ORS) if your health care provider tells you to use one. This is a drink to treat dehydration. It can be found in pharmacies and retail stores. How to rehydrate Fluids Follow instructions from your health care provider for rehydration. The kind of fluid and the amount you should drink depend on your condition. In general, you should choose drinks that you prefer. If told by your health care provider, drink an ORS. Make an ORS by following instructions on the package. Start by drinking small amounts, about  cup (120 mL) every 5-10 minutes. Slowly increase how much you drink until you have taken the amount recommended by your health care provider. Drink enough clear fluids to keep your urine pale yellow. If you were told to drink an ORS, finish it first, then start slowly drinking other clear fluids. Drink fluids such as: Water. This includes sparkling water and flavored water. Drinking only water can lead to having too little sodium in your body (hyponatremia). Follow the advice of your health care provider. Water from ice chips you suck on. Fruit juice with water you add to it  (diluted). Sports drinks. Hot or cold herbal teas. Broth-based soups. Milk or milk products. Food Follow instructions from your health care provider about what to eat while you rehydrate. Your health care provider may recommend that you slowly begin eating regular foods in small amounts. Eat foods that contain a healthy balance of electrolytes, such as bananas, oranges, potatoes, tomatoes, and spinach. Avoid foods that are greasy or contain a lot of sugar. In some cases, you may get nutrition through a feeding tube that is passed through your nose and into your stomach (nasogastric tube, or NG tube). This may be done if you have uncontrolled vomiting or diarrhea. Beverages to avoid Certain beverages may make dehydration worse. While you rehydrate, avoid drinking alcohol. How to tell if you are recovering from dehydration You may be recovering from dehydration if: You are urinating more often than before you started rehydrating. Your urine is pale yellow. Your energy level improves. You vomit less frequently. You have diarrhea less frequently. Your appetite improves or returns to normal. You feel less dizzy or less light-headed. Your skin tone and color start to look more normal. Follow these instructions at home: Take over-the-counter and prescription medicines only as told by your health care provider. Do not take sodium tablets. Doing this can lead to having too much sodium in your body (hypernatremia). Contact a health  care provider if: You continue to have symptoms of mild or moderate dehydration, such as: Thirst. Dry lips. Slightly dry mouth. Dizziness. Dark urine or less urine than normal. Muscle cramps. You continue to vomit or have diarrhea. Get help right away if you: Have symptoms of dehydration that get worse. Have a fever. Have a severe headache. Have been vomiting and the following happens: Your vomiting gets worse or does not go away. Your vomit includes blood or  green matter (bile). You cannot eat or drink without vomiting. Have problems with urination or bowel movements, such as: Diarrhea that gets worse or does not go away. Blood in your stool (feces). This may cause stool to look black and tarry. Not urinating, or urinating only a small amount of very dark urine, within 6-8 hours. Have trouble breathing. Have symptoms that get worse with treatment. These symptoms may represent a serious problem that is an emergency. Do not wait to see if the symptoms will go away. Get medical help right away. Call your local emergency services (911 in the [place] Do not drive yourself to the hospital. Summary Rehydration is the replacement of body fluids and minerals (electrolytes) that are lost during dehydration. Follow instructions from your health care provider for rehydration. The kind of fluid and amount you should drink depend on your condition. Slowly increase how much you drink until you have taken the amount recommended by your health care provider. Contact your health care provider if you continue to show signs of mild or moderate dehydration. This information is not intended to replace advice given to you by your health care provider. Make sure you discuss any questions you have with your health care provider. Document Revised: 02/02/2020 Document Reviewed: 12/13/2019 Elsevier Patient Education  2022 [employer].

## 2021-12-21 NOTE — Assessment & Plan Note (Signed)
She is prone to dehydration Her oral intake is very poor I recommend the patient to aim for 90 ounces of liquid per day Family members is wondering about IV fluid hydration on a regular basis There is no barrier for the patient to drink water except she appears nonmotivated We have discussed various strategies to encourage her and to document oral fluid intake We will try that first

## 2021-12-21 NOTE — Assessment & Plan Note (Signed)
I have extensive discussion with the patient and family I went through all her prescription pills More than half of her pills are either expired or no longer actively used There were multiple duplication of medications Her imaging studies suggest that she responded well to treatment but the patient is not able to function due to multiple different complications that may or may not be directly related to the treatment I plan aggressive supportive care I plan to have virtual visit with her every week to manage her symptoms

## 2021-12-21 NOTE — Assessment & Plan Note (Signed)
She is very debilitated and functionally limited She has shortness of breath on minimal exertion Her lifestyle is very sedentary We discussed importance of simple exercises I do recommend outpatient physical therapy and rehab but at her current state, she would not qualify due to her poor mobility I will give the patient a chance to get her oral intake improved, get better control of blood pressure and frequent mobility at home When she is stronger, we will refer her for outpatient physical therapy and rehab

## 2021-12-22 LAB — T4: T4, Total: 8.3 ug/dL (ref 4.5–12.0)

## 2021-12-24 ENCOUNTER — Other Ambulatory Visit: Payer: Self-pay | Admitting: Hematology and Oncology

## 2021-12-24 ENCOUNTER — Telehealth: Payer: Self-pay

## 2021-12-24 ENCOUNTER — Other Ambulatory Visit (HOSPITAL_COMMUNITY): Payer: Self-pay

## 2021-12-24 DIAGNOSIS — C541 Malignant neoplasm of endometrium: Secondary | ICD-10-CM

## 2021-12-24 DIAGNOSIS — R5381 Other malaise: Secondary | ICD-10-CM

## 2021-12-24 NOTE — Telephone Encounter (Signed)
Pt called and asks for Rx for walker. Per MD, pt will need to show she can go to o/p PT and be evaluated for a walker. Pt states she is eating, drinking, taking her meds and is ambulating around her home and she is willing to do PT. MD will place PT referral. Pt knows to call with any concerns.

## 2021-12-28 ENCOUNTER — Other Ambulatory Visit: Payer: Self-pay

## 2021-12-28 ENCOUNTER — Encounter: Payer: Self-pay | Admitting: Hematology and Oncology

## 2021-12-28 ENCOUNTER — Telehealth (HOSPITAL_BASED_OUTPATIENT_CLINIC_OR_DEPARTMENT_OTHER): Payer: Medicare HMO | Admitting: Hematology and Oncology

## 2021-12-28 DIAGNOSIS — C541 Malignant neoplasm of endometrium: Secondary | ICD-10-CM

## 2021-12-28 DIAGNOSIS — E860 Dehydration: Secondary | ICD-10-CM | POA: Diagnosis not present

## 2021-12-28 DIAGNOSIS — I10 Essential (primary) hypertension: Secondary | ICD-10-CM | POA: Diagnosis not present

## 2021-12-28 NOTE — Assessment & Plan Note (Signed)
She is able to document her oral intake consistently She average about 53 ounces of fluid yesterday and she is trying very hard to drink more liquids I encouraged her to continue to document oral fluid intake with a goal of at least 80 ounces of fluid if possible

## 2021-12-28 NOTE — Progress Notes (Signed)
HEMATOLOGY-ONCOLOGY ELECTRONIC VISIT PROGRESS NOTE  Patient Care Team: [name], [name], MD as PCP - General (Family Medicine) Awanda Mink Craige Cotta, RN as Oncology Nurse Navigator (Oncology)  I connected with the patient via telephone conference and verified that I am speaking with the correct person using two identifiers. The patient's location is at home and I am providing care from the [facility] I discussed the limitations, risks, security and privacy concerns of performing an evaluation and management service by e-visits and the availability of in person appointments.  I also discussed with the patient that there may be a patient responsible charge related to this service. The patient expressed understanding and agreed to proceed.   ASSESSMENT & PLAN:  Endometrial cancer ([name]) Since last time I saw her, she is doing better Her documented blood pressure has improved She is drinking more liquids She continues to feel dizzy but overall improved I continue to encourage the patient to increase oral fluid intake and to document her blood pressure twice daily Will call her again next week for further follow-up  Essential hypertension She has elevated blood pressure on January 9 but over the last few days, her systolic blood pressure average around the 140s She is feeling well although she complained of persistent dizziness For now, I do not recommend changes to her blood pressure medications  Dehydration She is able to document her oral intake consistently She average about 53 ounces of fluid yesterday and she is trying very hard to drink more liquids I encouraged her to continue to document oral fluid intake with a goal of at least 80 ounces of fluid if possible  No orders of the defined types were placed in this encounter.   INTERVAL HISTORY: Please see below for problem oriented charting. The purpose of today's discussion is to follow on recent outpatient evaluation, ongoing  treatment with pembrolizumab and Lenvima She is currently enjoying time with [place] at the beach She felt much better She denies nausea or diarrhea She is attempting to drink more liquids and average approximately 53 ounces of water yesterday Her documented blood pressure for the last 2 days, on average, systolic blood pressure in the 341D and diastolic in the 62I She continues to have mild dizziness  SUMMARY OF ONCOLOGIC HISTORY: Oncology History Overview Note  ER/PR neg, MSI stable   Endometrial cancer ([place])  06/15/2018 Surgery   Surgeon: Donaciano Eva     Operation: Robotic-assisted laparoscopic total hysterectomy with bilateral salpingoophorectomy, SLN biopsy     Operative Findings:  : 6cm uterus, normal tubes and ovaries, omentum adherent to the anterior abdominal wall and uterine fundus. No suspicious nodes.   06/15/2018 Pathology Results   Procedure: Total hysterectomy with bilateral salpingo-oophorectomy. Bilateral obturator sentinel lymph node biopsies. Histologic type: Endometrioid adenocarcinoma. Histologic Grade: FIGO grade 1. Myometrial invasion: Depth of invasion: 15 mm Myometrial thickness: 20 mm Uterine Serosa Involvement: Not identified. Cervical stromal involvement: Not identified. Extent of involvement of other organs: Uninvolved. Lymphovascular invasion: Present. Regional Lymph Nodes: Examined: 2 Sentinel 0 Non-sentinel 2 Total Lymph nodes with metastasis: 0 Isolated tumor cells (< 0.2 mm): 2 Micrometastasis: (> 0.2 mm and < 2.0 mm): 0 Macrometastasis: (> 2.0 mm): 0 Extracapsular extension: N/A. Tumor block for ancillary studies: 4Q-S. MMR / MSI testing: Will be ordered. Pathologic Stage Classification (pTNM, AJCC 8th edition): pT1b, pN0(i+)   07/20/2018 Imaging   1. Surgical changes from recent hysterectomy. No findings suspicious for residual tumor or adenopathy in the pelvis. 2. No  CT findings to suggest omental, peritoneal surface or solid organ  metastatic disease in the abdomen. 3. Changes consistent with known sarcoidosis in the chest with bulky calcified mediastinal and hilar lymph nodes and interstitial lung disease. I do not see any definite CT findings to suggest metastatic disease involving the chest.   08/19/2018 - 09/17/2018 Radiation Therapy   08/19/18 - 09/17/18: Vaginal Cuff, 3 cm cylinder with treatment length of 3 cm / 30 Gy delivered in 5 fractions of 6 Gy, Brachytherapy / HDR, Iridium-192   05/2005: Left Chest Wall keloid   04/2004 - 05/2004: Anus, pelvis /inguinal ( Dr. Tammi Klippel)   01/19/2020 Imaging   1. No definite CT findings of the abdomen or pelvis to explain pain. 2. The sigmoid colon and rectum are decompressed although slightly thickened appearing, suggestive of nonspecific infectious, inflammatory, or ischemic colitis. Correlate for referable clinical symptoms. 3. There is trace, nonspecific free fluid in the low pelvis, which may be reactive, although would be difficult to distinguish from postoperative and post treatment soft tissue change in the pelvis given history of hysterectomy and rectal cancer. 4. Unchanged prominent retroperitoneal and iliac lymph nodes. No new findings suspicious for metastatic disease in the abdomen or pelvis. 5. Bibasilar pulmonary fibrosis and mediastinal and hilar lymphadenopathy, better evaluated by prior CT chest and consistent with patient diagnosis of sarcoidosis.       04/18/2020 Imaging   Venous Doppler Left:  - No evidence of deep vein thrombosis seen in the left lower extremity, from the common femoral through the popliteal veins.  - No evidence of superficial venous thrombosis in the left lower extremity.  - There is no evidence of venous reflux seen in the left lower extremity.  - Rouleax flow noted in the superficial system.    05/18/2020 PET scan   1. Complex scan with a combination of hypermetabolic granulomatous disease (sarcoidosis) as well as hypermetabolic metastatic  endometrial carcinoma. 2. Hypermetabolic mediastinal hilar lymph nodes and pulmonary nodularity with bronchovascular thickening is consistent with sarcoidosis. 3. Solitary hypermetabolic nodule in the medial LEFT lower lobe, new from prior with intense metabolic activity, is concerning for a metastatic deposit to the LEFT lower lobe. 4. Ill-defined hypermetabolic soft tissue along the LEFT operator space is most consistent with local recurrence of endometrial carcinoma. There is entrapment of the LEFT ureter by this hypermetabolic soft tissue with mild hydroureter and hydronephrosis on the LEFT. 5. Additionally there appears to be direct extension from the hypermetabolic left operator space soft tissue to the sigmoid colon with there is a well-defined hypermetabolic lesion in the colon. 6. More distant hypermetabolic peritoneal metastasis along the ventral peritoneal surface of the RIGHT abdomen beneath RIGHT rectus abdominus muscle. 7. Hypermetabolic lymph nodes in the porta hepatis are favored related to sarcoidosis.   05/19/2020 Cancer Staging   Staging form: Corpus Uteri - Carcinoma and Carcinosarcoma, AJCC 8th Edition - Clinical stage from 05/19/2020: Stage IVB (rcT1, cN2a, cM1) - Signed by [name], MD on 05/19/2020    05/26/2020 Procedure   Successful placement of a right internal jugular approach power injectable Port-A-Cath. The catheter is ready for immediate use   05/29/2020 - 09/27/2020 Chemotherapy   The patient had carboplatin and taxol for chemotherapy treatment.     07/31/2020 Imaging   1. Asymmetric soft tissue mass extending from the left vaginal cuff has decreased in size in the interval. No residual peritoneal nodularity. Pulmonary metastases are stable with exception of a minimally improved left lower lobe nodule. 2.  Stable soft tissue nodule associated with the right rectus abdominus muscle. 3. Interval resolution of left hydronephrosis. 4. Calcified mediastinal and hilar  lymph nodes, together with pulmonary parenchymal fibrosis, findings which are in keeping with patient's known diagnosis of sarcoid. 5. Aortic atherosclerosis (ICD10-I70.0). Coronary artery calcification.   10/27/2020 Imaging   1. Essentially stable appearance of the soft tissue density nodule posteriorly in the left rectus abdominus muscle, currently 1.8 by 1.2 cm, previously 11 mm. 2. Stable calcified adenopathy in the mediastinum and hila, compatible with prior granulomatous disease. 3. Stable small right lower lobe pulmonary nodules. 4. Stable soft tissue density prominence along the left vaginal cuff extending up towards the adnexa, probably therapy related. 5. Other imaging findings of potential clinical significance: Coronary atherosclerosis. Small type 1 hiatal hernia. Stable subpleural reticulation favoring the lung bases, possibly from mild fibrosis. 6. Aortic atherosclerosis.   01/19/2021 Imaging   1. Status post hysterectomy. Interval enlargement of soft tissue adjacent to the left aspect of the vaginal cuff and bladder, measuring approximately 4.3 x 2.9 cm in axial section, previously previously previously 3.7 x 1.8 cm when measured similarly. Extensive soft tissue thickening and stranding throughout the low pelvis. 2. Interval enlargement of a metastatic nodule within the right rectus abdominus. New small peritoneal nodule anterior to the sigmoid colon. 3. Interval enlargement of retroperitoneal retroperitoneal lymph nodes. 4. Multiple small bilateral pulmonary nodules, several of which are new and enlarged. 5. Constellation of findings above is consistent with worsened metastatic disease. 6. New left hydronephrosis and hydroureter, the distal left ureter obstructed by soft tissue near the left aspect of the urinary bladder and vaginal cuff. 7. Unchanged enlarged, partially calcified mediastinal and hilar lymph nodes,, in keeping with prior granulomatous infection or sarcoidosis. 8.  Mild underlying pulmonary fibrosis, in a UIP pattern. 9. Coronary artery disease.   02/02/2021 - 07/13/2021 Chemotherapy    Patient is on carboplatin and docetaxel    04/17/2021 Imaging   1. Interval decrease in size of multiple pulmonary nodules, with resolution of some previously seen nodules. 2. Interval decrease in size of enlarged retroperitoneal lymph nodes. 3. Interval decrease in size of soft tissue at the left aspect of the vaginal cuff status post hysterectomy. 4. Interval decrease in size of a soft tissue nodule within the posterior aspect of the right rectus abdominus muscle body. 5. Constellation of findings is consistent with treatment response of locally recurrent, nodal, and distant metastatic disease. 6. There has been interval resolution of previously seen left hydronephrosis and hydroureter. 7. 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   Stable mild partially calcified mediastinal and bilateral hilar lymphadenopathy.   Stable sub-cm abdominal retroperitoneal lymph nodes.   Aortic Atherosclerosis (ICD10-I70.0).     09/03/2021 -  Chemotherapy   Patient is on Treatment Plan : UTERINE Lenvatinib + Pembrolizumab q21d     11/21/2021 Imaging   Soft tissue nodule in the RIGHT rectus muscle with enhancement (image 79/2) this measures 1.7 x 1.8 is cm not substantially changed compared to prior imaging from August and with enlargement since May of 2022 where it measured approximately 16 x 11 mm.    Signs of hysterectomy and post treatment related changes in the pelvis. No new signs of disease.   Septal thickening and ground-glass in the chest with similar appearance aside from a new area of bandlike changes in the RIGHT upper lobe favored to  be post infectious or inflammatory, attention on follow-up.   Stable enlarged mediastinal and bihilar lymph nodes, partially calcified.  Patient has reported history of sarcoidosis. Some of the pulmonary findings above may be related underlying sarcoidosis despite atypical distribution.   Aortic atherosclerosis.   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     02/02/2021 - 07/13/2021 Chemotherapy    Patient is on carboplatin and docetaxel    09/03/2021 -  Chemotherapy   Patient is on Treatment Plan : UTERINE Lenvatinib + Pembrolizumab q21d       REVIEW OF SYSTEMS:   Constitutional: Denies fevers, chills or abnormal weight loss Eyes: Denies blurriness of vision Ears, nose, mouth, throat, and face: Denies mucositis or sore throat Respiratory: Denies cough, dyspnea or wheezes Cardiovascular: Denies palpitation, chest discomfort Gastrointestinal:  Denies nausea, heartburn or change in bowel habits Skin: Denies abnormal skin rashes Lymphatics: Denies new lymphadenopathy or easy bruising Neurological:Denies numbness, tingling or new weaknesses Behavioral/Psych: Mood is stable, no new changes  Extremities: No lower extremity edema All other systems were reviewed with the patient and are negative.  I have reviewed the past medical history, past surgical history, social history and family history with the patient and they are unchanged from previous note.  ALLERGIES:  is allergic to  shellfish allergy and penicillins.  MEDICATIONS:  Current Outpatient Medications  Medication Sig Dispense Refill   albuterol (PROAIR HFA) 108 (90 Base) MCG/ACT inhaler 2 puffs every 4 hours as needed only  if your can't catch your breath 18 g 2   bisoprolol (ZEBETA) 10 MG tablet Take 1 tablet (10 mg total) by mouth in the morning and at bedtime. 60 tablet 1   desonide (DESOWEN) 0.05 % lotion Apply topically 2 (two) times daily. Continue following with dermatologist 59 mL 0   diphenhydrAMINE (BENADRYL) 25 MG tablet Take 25 mg by mouth every 6 (six) hours as needed.     hydrochlorothiazide (HYDRODIURIL) 25 MG tablet Take 1 tablet (25 mg total) by mouth daily. 30 tablet 3   lenvatinib 20 mg daily dose (LENVIMA) 2 x 10 MG capsule Take 1 capsule (10 mg total) by mouth daily. 30 capsule 11   levothyroxine (SYNTHROID) 100 MCG tablet Take 1 tablet (100 mcg total) by mouth daily before breakfast. 30 tablet 3   lidocaine-prilocaine (EMLA) cream Apply 1 application topically as needed. 30 g 3   loratadine (CLARITIN) 10 MG tablet Take 10 mg by mouth daily as needed for allergies.     mometasone-formoterol (DULERA) 100-5 MCG/ACT AERO Take 2 puffs first thing in am and then another 2 puffs about 12 hours later. 3 each 3   nystatin (MYCOSTATIN/NYSTOP) powder Apply to affected area 2 (two) times daily. 60 g 0   ondansetron (ZOFRAN) 8 MG tablet Take 1 tablet (8 mg total) by mouth every 8 (eight) hours as needed for  nausea. 30 tablet 3   prochlorperazine (COMPAZINE) 10 MG tablet TAKE 1 TABLET BY MOUTH EVERY 6 HOURS AS NEEDED FOR NAUSEA OR VOMITING 60 tablet 3   triamcinolone (NASACORT) 55 MCG/ACT AERO nasal inhaler Place 2 sprays into each nostril daily. 16.9 mL 12   vitamin C (ASCORBIC ACID) 500 MG tablet Take 500 mg by mouth daily.     No current facility-administered medications for this visit.    PHYSICAL EXAMINATION: ECOG PERFORMANCE STATUS: 1 - Symptomatic but completely ambulatory  LABORATORY DATA:   I have reviewed the data as listed CMP Latest Ref Rng & Units 12/21/2021 12/05/2021 11/22/2021  Glucose 70 - 99 mg/dL 111(H) 118(H) 102(H)  BUN 8 - 23 mg/dL 14 11 15   Creatinine 0.44 - 1.00 mg/dL 0.97 0.95 0.92  Sodium 135 - 145 mmol/L 140 139 142  Potassium 3.5 - 5.1 mmol/L 4.1 3.4(L) 3.7  Chloride 98 - 111 mmol/L 106 103 107  CO2 22 - 32 mmol/L 25 26 23   Calcium 8.9 - 10.3 mg/dL 9.0 9.1 9.3  Total Protein 6.5 - 8.1 g/dL 7.7 7.7 7.4  Total Bilirubin 0.3 - 1.2 mg/dL 0.6 0.8 0.7  Alkaline Phos 38 - 126 U/L 111 93 92  AST 15 - 41 U/L 23 27 21   ALT 0 - 44 U/L 26 28 21     Lab Results  Component Value Date   WBC 6.2 12/21/2021   HGB 10.8 (L) 12/21/2021   HCT 31.2 (L) 12/21/2021   MCV 95.7 12/21/2021   PLT 176 12/21/2021   NEUTROABS 4.1 12/21/2021    I discussed the assessment and treatment plan with the patient. The patient was provided an opportunity to ask questions and all were answered. The patient agreed with the plan and demonstrated an understanding of the instructions. The patient was advised to call back or seek an in-person evaluation if the symptoms worsen or if the condition fails to improve as anticipated.    I spent 20 minutes for the appointment reviewing test results, discuss management and coordination of care.  [name], MD 12/28/2021 3:18 PM

## 2021-12-28 NOTE — Assessment & Plan Note (Signed)
Since last time I saw her, she is doing better Her documented blood pressure has improved She is drinking more liquids She continues to feel dizzy but overall improved I continue to encourage the patient to increase oral fluid intake and to document her blood pressure twice daily Will call her again next week for further follow-up

## 2021-12-28 NOTE — Assessment & Plan Note (Signed)
She has elevated blood pressure on January 9 but over the last few days, her systolic blood pressure average around the 140s She is feeling well although she complained of persistent dizziness For now, I do not recommend changes to her blood pressure medications

## 2022-01-08 ENCOUNTER — Telehealth: Payer: Self-pay

## 2022-01-08 NOTE — Telephone Encounter (Signed)
Oral Oncology Patient Advocate Encounter  Met patient in [place] room to complete re-enrollment application for [employer] in an effort to reduce patient's out of pocket expense for Lenvima to $0.    Application completed and faxed to [phone].   [employer] patient assistance phone number for follow up is [phone].   This encounter will be updated until final determination.   [place] Patient [place] Phone [phone] Fax [phone] 01/08/2022 10:28 AM

## 2022-01-08 NOTE — Telephone Encounter (Signed)
Patient is approved for Lenvima at no cost from [facility] 12/16/21-12/15/22  [place] uses [place] Patient [place] Phone [phone] Fax [phone] 01/08/2022 10:29 AM

## 2022-01-14 ENCOUNTER — Other Ambulatory Visit: Payer: Self-pay

## 2022-01-14 ENCOUNTER — Inpatient Hospital Stay: Payer: Medicare HMO

## 2022-01-14 ENCOUNTER — Encounter: Payer: Self-pay | Admitting: Hematology and Oncology

## 2022-01-14 ENCOUNTER — Inpatient Hospital Stay (HOSPITAL_BASED_OUTPATIENT_CLINIC_OR_DEPARTMENT_OTHER): Payer: Medicare HMO | Admitting: Hematology and Oncology

## 2022-01-14 DIAGNOSIS — C7802 Secondary malignant neoplasm of left lung: Secondary | ICD-10-CM

## 2022-01-14 DIAGNOSIS — C775 Secondary and unspecified malignant neoplasm of intrapelvic lymph nodes: Secondary | ICD-10-CM

## 2022-01-14 DIAGNOSIS — I10 Essential (primary) hypertension: Secondary | ICD-10-CM | POA: Diagnosis not present

## 2022-01-14 DIAGNOSIS — C541 Malignant neoplasm of endometrium: Secondary | ICD-10-CM | POA: Diagnosis not present

## 2022-01-14 DIAGNOSIS — Z5112 Encounter for antineoplastic immunotherapy: Secondary | ICD-10-CM | POA: Diagnosis not present

## 2022-01-14 DIAGNOSIS — Z79899 Other long term (current) drug therapy: Secondary | ICD-10-CM | POA: Diagnosis not present

## 2022-01-14 DIAGNOSIS — E860 Dehydration: Secondary | ICD-10-CM | POA: Diagnosis not present

## 2022-01-14 LAB — CBC WITH DIFFERENTIAL (CANCER CENTER ONLY)
Abs Immature Granulocytes: 0.01 10*3/uL (ref 0.00–0.07)
Basophils Absolute: 0 10*3/uL (ref 0.0–0.1)
Basophils Relative: 0 %
Eosinophils Absolute: 0.3 10*3/uL (ref 0.0–0.5)
Eosinophils Relative: 4 %
HCT: 33.6 % — ABNORMAL LOW (ref 36.0–46.0)
Hemoglobin: 11.6 g/dL — ABNORMAL LOW (ref 12.0–15.0)
Immature Granulocytes: 0 %
Lymphocytes Relative: 19 %
Lymphs Abs: 1.4 10*3/uL (ref 0.7–4.0)
MCH: 33.3 pg (ref 26.0–34.0)
MCHC: 34.5 g/dL (ref 30.0–36.0)
MCV: 96.6 fL (ref 80.0–100.0)
Monocytes Absolute: 0.9 10*3/uL (ref 0.1–1.0)
Monocytes Relative: 12 %
Neutro Abs: 4.6 10*3/uL (ref 1.7–7.7)
Neutrophils Relative %: 65 %
Platelet Count: 221 10*3/uL (ref 150–400)
RBC: 3.48 MIL/uL — ABNORMAL LOW (ref 3.87–5.11)
RDW: 16.7 % — ABNORMAL HIGH (ref 11.5–15.5)
WBC Count: 7.2 10*3/uL (ref 4.0–10.5)
nRBC: 0 % (ref 0.0–0.2)

## 2022-01-14 LAB — CMP (CANCER CENTER ONLY)
ALT: 22 U/L (ref 0–44)
AST: 22 U/L (ref 15–41)
Albumin: 4.1 g/dL (ref 3.5–5.0)
Alkaline Phosphatase: 111 U/L (ref 38–126)
Anion gap: 10 (ref 5–15)
BUN: 31 mg/dL — ABNORMAL HIGH (ref 8–23)
CO2: 25 mmol/L (ref 22–32)
Calcium: 9.5 mg/dL (ref 8.9–10.3)
Chloride: 104 mmol/L (ref 98–111)
Creatinine: 1.33 mg/dL — ABNORMAL HIGH (ref 0.44–1.00)
GFR, Estimated: 45 mL/min — ABNORMAL LOW (ref >60)
Glucose, Bld: 148 mg/dL — ABNORMAL HIGH (ref 70–99)
Potassium: 3.7 mmol/L (ref 3.5–5.1)
Sodium: 139 mmol/L (ref 135–145)
Total Bilirubin: 0.7 mg/dL (ref 0.3–1.2)
Total Protein: 8.2 g/dL — ABNORMAL HIGH (ref 6.5–8.1)

## 2022-01-14 LAB — TOTAL PROTEIN, URINE DIPSTICK: Protein, ur: 30 mg/dL — AB

## 2022-01-14 LAB — TSH: TSH: 2.74 u[IU]/mL (ref 0.308–3.960)

## 2022-01-14 MED ORDER — HEPARIN SOD (PORK) LOCK FLUSH 100 UNIT/ML IV SOLN
500.0000 [IU] | Freq: Once | INTRAVENOUS | Status: AC | PRN
  Administered 2022-01-14: 500 [IU]

## 2022-01-14 MED ORDER — SODIUM CHLORIDE 0.9% FLUSH
10.0000 mL | INTRAVENOUS | Status: DC | PRN
  Administered 2022-01-14: 10 mL

## 2022-01-14 MED ORDER — SODIUM CHLORIDE 0.9 % IV SOLN
200.0000 mg | Freq: Once | INTRAVENOUS | Status: AC
  Administered 2022-01-14: 200 mg via INTRAVENOUS
  Filled 2022-01-14: qty 200

## 2022-01-14 MED ORDER — HEPARIN SOD (PORK) LOCK FLUSH 100 UNIT/ML IV SOLN
500.0000 [IU] | Freq: Once | INTRAVENOUS | Status: AC
  Administered 2022-01-14: 500 [IU]

## 2022-01-14 MED ORDER — SODIUM CHLORIDE 0.9% FLUSH
10.0000 mL | Freq: Once | INTRAVENOUS | Status: AC
  Administered 2022-01-14: 10 mL

## 2022-01-14 MED ORDER — SODIUM CHLORIDE 0.9 % IV SOLN: Freq: Once | INTRAVENOUS | Status: AC

## 2022-01-14 NOTE — Assessment & Plan Note (Signed)
Since last time I saw her, she is doing better Her blood pressure has improved and she is attempting to drink more liquids We will continue treatment as scheduled I will see her again in 3 weeks for further follow-up

## 2022-01-14 NOTE — Assessment & Plan Note (Signed)
She has intermittent elevated creatinine secondary to dehydration I reinforced the importance of adequate fluid hydration

## 2022-01-14 NOTE — Progress Notes (Signed)
[name] OFF[facility] PROGRESS NOTE  Patient Care Team: [name], [name], MD as PCP - General (Family Medicine) Awanda Mink Craige Cotta, RN as Oncology Nurse Navigator (Oncology)  ASSESSMENT & PLAN:  Endometrial cancer [facility]) Since last time I saw her, she is doing better Her blood pressure has improved and she is attempting to drink more liquids We will continue treatment as scheduled I will see her again in 3 weeks for further follow-up  Essential hypertension She did not bring her blood pressure readings with her but according to the patient, her systolic blood pressure is in the 130s to 140s We will monitor closely  Dehydration She has intermittent elevated creatinine secondary to dehydration I reinforced the importance of adequate fluid hydration  No orders of the defined types were placed in this encounter.   All questions were answered. The patient knows to call the clinic with any problems, questions or concerns. The total time spent in the appointment was 20 minutes encounter with patients including review of chart and various tests results, discussions about plan of care and coordination of care plan   [name], MD 01/14/2022 1:39 PM  INTERVAL HISTORY: Please see below for problem oriented charting. she returns for treatment follow-up on Lenvima and pembrolizumab She did not bring her documented blood pressure from home She is attempting to drink at least 60 ounces or more liquid per day Denies nausea or diarrhea Her energy level is fair  REVIEW OF SYSTEMS:   Constitutional: Denies fevers, chills or abnormal weight loss Eyes: Denies blurriness of vision Ears, nose, mouth, throat, and face: Denies mucositis or sore throat Respiratory: Denies cough, dyspnea or wheezes Cardiovascular: Denies palpitation, chest discomfort or lower extremity swelling Gastrointestinal:  Denies nausea, heartburn or change in bowel habits Skin: Denies abnormal skin rashes Lymphatics:  Denies new lymphadenopathy or easy bruising Neurological:Denies numbness, tingling or new weaknesses Behavioral/Psych: Mood is stable, no new changes  All other systems were reviewed with the patient and are negative.  I have reviewed the past medical history, past surgical history, social history and family history with the patient and they are unchanged from previous note.  ALLERGIES:  is allergic to shellfish allergy and penicillins.  MEDICATIONS:  Current Outpatient Medications  Medication Sig Dispense Refill   albuterol (PROAIR HFA) 108 (90 Base) MCG/ACT inhaler 2 puffs every 4 hours as needed only  if your can't catch your breath 18 g 2   bisoprolol (ZEBETA) 10 MG tablet Take 1 tablet (10 mg total) by mouth in the morning and at bedtime. 60 tablet 1   desonide (DESOWEN) 0.05 % lotion Apply topically 2 (two) times daily. Continue following with dermatologist 59 mL 0   diphenhydrAMINE (BENADRYL) 25 MG tablet Take 25 mg by mouth every 6 (six) hours as needed.     hydrochlorothiazide (HYDRODIURIL) 25 MG tablet Take 1 tablet (25 mg total) by mouth daily. 30 tablet 3   lenvatinib 20 mg daily dose (LENVIMA) 2 x 10 MG capsule Take 1 capsule (10 mg total) by mouth daily. 30 capsule 11   levothyroxine (SYNTHROID) 100 MCG tablet Take 1 tablet (100 mcg total) by mouth daily before breakfast. 30 tablet 3   lidocaine-prilocaine (EMLA) cream Apply 1 application topically as needed. 30 g 3   loratadine (CLARITIN) 10 MG tablet Take 10 mg by mouth daily as needed for allergies.     mometasone-formoterol (DULERA) 100-5 MCG/ACT AERO Take 2 puffs first thing in am and then another 2 puffs about  12 hours later. 3 each 3   nystatin (MYCOSTATIN/NYSTOP) powder Apply to affected area 2 (two) times daily. 60 g 0   ondansetron (ZOFRAN) 8 MG tablet Take 1 tablet (8 mg total) by mouth every 8 (eight) hours as needed for nausea. 30 tablet 3   prochlorperazine (COMPAZINE) 10 MG tablet TAKE 1 TABLET BY MOUTH EVERY 6  HOURS AS NEEDED FOR NAUSEA OR VOMITING 60 tablet 3   triamcinolone (NASACORT) 55 MCG/ACT AERO nasal inhaler Place 2 sprays into each nostril daily. 16.9 mL 12   vitamin C (ASCORBIC ACID) 500 MG tablet Take 500 mg by mouth daily.     No current facility-administered medications for this visit.   Facility-Administered Medications Ordered in Other Visits  Medication Dose Route Frequency Provider Last Rate Last Admin   heparin lock flush 100 unit/mL  500 Units Intracatheter Once PRN Alvy Bimler, , MD       pembrolizumab (KEYTRUDA) 200 mg in sodium chloride 0.9 % 50 mL chemo infusion  200 mg Intravenous Once [name], MD 130 mL/hr at 01/14/22 1337 Infusion Verify at 01/14/22 1337   sodium chloride flush (NS) 0.9 % injection 10 mL  10 mL Intracatheter PRN [name], MD        SUMMARY OF ONCOLOGIC HISTORY: Oncology History Overview Note  ER/PR neg, MSI stable   Endometrial cancer ([place])  06/15/2018 Surgery   Surgeon: Donaciano Eva     Operation: Robotic-assisted laparoscopic total hysterectomy with bilateral salpingoophorectomy, SLN biopsy     Operative Findings:  : 6cm uterus, normal tubes and ovaries, omentum adherent to the anterior abdominal wall and uterine fundus. No suspicious nodes.   06/15/2018 Pathology Results   Procedure: Total hysterectomy with bilateral salpingo-oophorectomy. Bilateral obturator sentinel lymph node biopsies. Histologic type: Endometrioid adenocarcinoma. Histologic Grade: FIGO grade 1. Myometrial invasion: Depth of invasion: 15 mm Myometrial thickness: 20 mm Uterine Serosa Involvement: Not identified. Cervical stromal involvement: Not identified. Extent of involvement of other organs: Uninvolved. Lymphovascular invasion: Present. Regional Lymph Nodes: Examined: 2 Sentinel 0 Non-sentinel 2 Total Lymph nodes with metastasis: 0 Isolated tumor cells (< 0.2 mm): 2 Micrometastasis: (> 0.2 mm and < 2.0 mm): 0 Macrometastasis: (> 2.0 mm):  0 Extracapsular extension: N/A. Tumor block for ancillary studies: 4Q-S. MMR / MSI testing: Will be ordered. Pathologic Stage Classification (pTNM, AJCC 8th edition): pT1b, pN0(i+)   07/20/2018 Imaging   1. Surgical changes from recent hysterectomy. No findings suspicious for residual tumor or adenopathy in the pelvis. 2. No CT findings to suggest omental, peritoneal surface or solid organ metastatic disease in the abdomen. 3. Changes consistent with known sarcoidosis in the chest with bulky calcified mediastinal and hilar lymph nodes and interstitial lung disease. I do not see any definite CT findings to suggest metastatic disease involving the chest.   08/19/2018 - 09/17/2018 Radiation Therapy   08/19/18 - 09/17/18: Vaginal Cuff, 3 cm cylinder with treatment length of 3 cm / 30 Gy delivered in 5 fractions of 6 Gy, Brachytherapy / HDR, Iridium-192   05/2005: Left Chest Wall keloid   04/2004 - 05/2004: Anus, pelvis /inguinal ( Dr. Tammi Klippel)   01/19/2020 Imaging   1. No definite CT findings of the abdomen or pelvis to explain pain. 2. The sigmoid colon and rectum are decompressed although slightly thickened appearing, suggestive of nonspecific infectious, inflammatory, or ischemic colitis. Correlate for referable clinical symptoms. 3. There is trace, nonspecific free fluid in the low pelvis, which may be reactive, although would be difficult to distinguish  from postoperative and post treatment soft tissue change in the pelvis given history of hysterectomy and rectal cancer. 4. Unchanged prominent retroperitoneal and iliac lymph nodes. No new findings suspicious for metastatic disease in the abdomen or pelvis. 5. Bibasilar pulmonary fibrosis and mediastinal and hilar lymphadenopathy, better evaluated by prior CT chest and consistent with patient diagnosis of sarcoidosis.       04/18/2020 Imaging   Venous Doppler Left:  - No evidence of deep vein thrombosis seen in the left lower extremity, from the  common femoral through the popliteal veins.  - No evidence of superficial venous thrombosis in the left lower extremity.  - There is no evidence of venous reflux seen in the left lower extremity.  - Rouleax flow noted in the superficial system.    05/18/2020 PET scan   1. Complex scan with a combination of hypermetabolic granulomatous disease (sarcoidosis) as well as hypermetabolic metastatic endometrial carcinoma. 2. Hypermetabolic mediastinal hilar lymph nodes and pulmonary nodularity with bronchovascular thickening is consistent with sarcoidosis. 3. Solitary hypermetabolic nodule in the medial LEFT lower lobe, new from prior with intense metabolic activity, is concerning for a metastatic deposit to the LEFT lower lobe. 4. Ill-defined hypermetabolic soft tissue along the LEFT operator space is most consistent with local recurrence of endometrial carcinoma. There is entrapment of the LEFT ureter by this hypermetabolic soft tissue with mild hydroureter and hydronephrosis on the LEFT. 5. Additionally there appears to be direct extension from the hypermetabolic left operator space soft tissue to the sigmoid colon with there is a well-defined hypermetabolic lesion in the colon. 6. More distant hypermetabolic peritoneal metastasis along the ventral peritoneal surface of the RIGHT abdomen beneath RIGHT rectus abdominus muscle. 7. Hypermetabolic lymph nodes in the porta hepatis are favored related to sarcoidosis.   05/19/2020 Cancer Staging   Staging form: Corpus Uteri - Carcinoma and Carcinosarcoma, AJCC 8th Edition - Clinical stage from 05/19/2020: Stage IVB (rcT1, cN2a, cM1) - Signed by [name], MD on 05/19/2020    05/26/2020 Procedure   Successful placement of a right internal jugular approach power injectable Port-A-Cath. The catheter is ready for immediate use   05/29/2020 - 09/27/2020 Chemotherapy   The patient had carboplatin and taxol for chemotherapy treatment.     07/31/2020 Imaging   1.  Asymmetric soft tissue mass extending from the left vaginal cuff has decreased in size in the interval. No residual peritoneal nodularity. Pulmonary metastases are stable with exception of a minimally improved left lower lobe nodule. 2. Stable soft tissue nodule associated with the right rectus abdominus muscle. 3. Interval resolution of left hydronephrosis. 4. Calcified mediastinal and hilar lymph nodes, together with pulmonary parenchymal fibrosis, findings which are in keeping with patient's known diagnosis of sarcoid. 5. Aortic atherosclerosis (ICD10-I70.0). Coronary artery calcification.   10/27/2020 Imaging   1. Essentially stable appearance of the soft tissue density nodule posteriorly in the left rectus abdominus muscle, currently 1.8 by 1.2 cm, previously 11 mm. 2. Stable calcified adenopathy in the mediastinum and hila, compatible with prior granulomatous disease. 3. Stable small right lower lobe pulmonary nodules. 4. Stable soft tissue density prominence along the left vaginal cuff extending up towards the adnexa, probably therapy related. 5. Other imaging findings of potential clinical significance: Coronary atherosclerosis. Small type 1 hiatal hernia. Stable subpleural reticulation favoring the lung bases, possibly from mild fibrosis. 6. Aortic atherosclerosis.   01/19/2021 Imaging   1. Status post hysterectomy. Interval enlargement of soft tissue adjacent to the left aspect of the vaginal  cuff and bladder, measuring approximately 4.3 x 2.9 cm in axial section, previously previously previously 3.7 x 1.8 cm when measured similarly. Extensive soft tissue thickening and stranding throughout the low pelvis. 2. Interval enlargement of a metastatic nodule within the right rectus abdominus. New small peritoneal nodule anterior to the sigmoid colon. 3. Interval enlargement of retroperitoneal retroperitoneal lymph nodes. 4. Multiple small bilateral pulmonary nodules, several of which are new and  enlarged. 5. Constellation of findings above is consistent with worsened metastatic disease. 6. New left hydronephrosis and hydroureter, the distal left ureter obstructed by soft tissue near the left aspect of the urinary bladder and vaginal cuff. 7. Unchanged enlarged, partially calcified mediastinal and hilar lymph nodes,, in keeping with prior granulomatous infection or sarcoidosis. 8. Mild underlying pulmonary fibrosis, in a UIP pattern. 9. Coronary artery disease.   02/02/2021 - 07/13/2021 Chemotherapy    Patient is on carboplatin and docetaxel    04/17/2021 Imaging   1. Interval decrease in size of multiple pulmonary nodules, with resolution of some previously seen nodules. 2. Interval decrease in size of enlarged retroperitoneal lymph nodes. 3. Interval decrease in size of soft tissue at the left aspect of the vaginal cuff status post hysterectomy. 4. Interval decrease in size of a soft tissue nodule within the posterior aspect of the right rectus abdominus muscle body. 5. Constellation of findings is consistent with treatment response of locally recurrent, nodal, and distant metastatic disease. 6. There has been interval resolution of previously seen left hydronephrosis and hydroureter. 7. 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   Stable mild partially calcified mediastinal and bilateral hilar lymphadenopathy.   Stable sub-cm abdominal retroperitoneal lymph nodes.   Aortic Atherosclerosis (ICD10-I70.0).     09/03/2021 -  Chemotherapy   Patient is on Treatment Plan : UTERINE Lenvatinib + Pembrolizumab q21d     11/21/2021 Imaging   Soft tissue nodule in the RIGHT rectus muscle with enhancement (image 79/2) this measures 1.7 x 1.8 is cm not substantially changed compared to prior imaging from August and with enlargement since May of 2022 where it measured approximately 16 x 11  mm.    Signs of hysterectomy and post treatment related changes in the pelvis. No new signs of disease.   Septal thickening and ground-glass in the chest with similar appearance aside from a new area of bandlike changes in the RIGHT upper lobe favored to be post infectious or inflammatory, attention on follow-up.   Stable enlarged mediastinal and bihilar lymph nodes, partially calcified. Patient has reported history of sarcoidosis. Some of the pulmonary findings above may be related underlying sarcoidosis despite atypical distribution.   Aortic atherosclerosis.   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     02/02/2021 - 07/13/2021 Chemotherapy  Patient is on carboplatin and docetaxel    09/03/2021 -  Chemotherapy   Patient is on Treatment Plan : UTERINE Lenvatinib + Pembrolizumab q21d       PHYSICAL EXAMINATION: ECOG PERFORMANCE STATUS: 1 - Symptomatic but completely ambulatory  Vitals:   01/14/22 1220  BP: (!) 147/76  Pulse: 66  Resp: 18  SpO2: 98%   Filed Weights   01/14/22 1220  Weight: 196 lb 9.6 oz (89.2 kg)    GENERAL:alert, no distress and comfortable SKIN: skin color, texture, turgor are normal, no rashes or significant lesions EYES: normal, Conjunctiva are pink and non-injected, sclera clear OROPHARYNX:no exudate, no  erythema and lips, buccal mucosa, and tongue normal  NECK: supple, thyroid normal size, non-tender, without nodularity LYMPH:  no palpable lymphadenopathy in the cervical, axillary or inguinal LUNGS: clear to auscultation and percussion with normal breathing effort HEART: regular rate & rhythm and no murmurs and no lower extremity edema ABDOMEN:abdomen soft, non-tender and normal bowel sounds Musculoskeletal:no cyanosis of digits and no clubbing  NEURO: alert & oriented x 3 with fluent speech, no focal motor/sensory deficits  LABORATORY DATA:  I have reviewed the data as listed    Component Value Date/Time   NA 139 01/14/2022 1159   K 3.7 01/14/2022 1159   CL 104 01/14/2022 1159   CO2 25 01/14/2022 1159   GLUCOSE 148 (H) 01/14/2022 1159   BUN 31 (H) 01/14/2022 1159   CREATININE 1.33 (H) 01/14/2022 1159   CALCIUM 9.5 01/14/2022 1159   PROT 8.2 (H) 01/14/2022 1159   ALBUMIN 4.1 01/14/2022 1159   AST 22 01/14/2022 1159   ALT 22 01/14/2022 1159   ALKPHOS 111 01/14/2022 1159   BILITOT 0.7 01/14/2022 1159   GFRNONAA 45 (L) 01/14/2022 1159   GFRAA >60 09/08/2020 0819    No results found for: SPEP, UPEP  Lab Results  Component Value Date   WBC 7.2 01/14/2022   NEUTROABS 4.6 01/14/2022   HGB 11.6 (L) 01/14/2022   HCT 33.6 (L) 01/14/2022   MCV 96.6 01/14/2022   PLT 221 01/14/2022      Chemistry      Component Value Date/Time   NA 139 01/14/2022 1159   K 3.7 01/14/2022 1159   CL 104 01/14/2022 1159   CO2 25 01/14/2022 1159   BUN 31 (H) 01/14/2022 1159   CREATININE 1.33 (H) 01/14/2022 1159      Component Value Date/Time   CALCIUM 9.5 01/14/2022 1159   ALKPHOS 111 01/14/2022 1159   AST 22 01/14/2022 1159   ALT 22 01/14/2022 1159   BILITOT 0.7 01/14/2022 1159

## 2022-01-14 NOTE — Patient Instructions (Signed)
[facility] MEDICAL ONCOLOGY  Discharge Instructions: ?Thank you for choosing [facility] to provide your oncology and hematology care.  ? ?If you have a lab appointment with the Cancer Center, please go directly to the Cancer Center and check in at the registration area. ?  ?Wear comfortable clothing and clothing appropriate for easy access to any Portacath or PICC line.  ? ?We strive to give you quality time with your provider. You may need to reschedule your appointment if you arrive late (15 or more minutes).  Arriving late affects you and other patients whose appointments are after yours.  Also, if you miss three or more appointments without notifying the office, you may be dismissed from the clinic at the provider?s discretion.    ?  ?For prescription refill requests, have your pharmacy contact our office and allow 72 hours for refills to be completed.   ? ?Today you received the following chemotherapy and/or immunotherapy agents: Keytruda ?  ?To help prevent nausea and vomiting after your treatment, we encourage you to take your nausea medication as directed. ? ?BELOW ARE SYMPTOMS THAT SHOULD BE REPORTED IMMEDIATELY: ?*FEVER GREATER THAN 100.4 F (38 ?C) OR HIGHER ?*CHILLS OR SWEATING ?*NAUSEA AND VOMITING THAT IS NOT CONTROLLED WITH YOUR NAUSEA MEDICATION ?*UNUSUAL SHORTNESS OF BREATH ?*UNUSUAL BRUISING OR BLEEDING ?*URINARY PROBLEMS (pain or burning when urinating, or frequent urination) ?*BOWEL PROBLEMS (unusual diarrhea, constipation, pain near the anus) ?TENDERNESS IN MOUTH AND THROAT WITH OR WITHOUT PRESENCE OF ULCERS (sore throat, sores in mouth, or a toothache) ?UNUSUAL RASH, SWELLING OR PAIN  ?UNUSUAL VAGINAL DISCHARGE OR ITCHING  ? ?Items with * indicate a potential emergency and should be followed up as soon as possible or go to the Emergency Department if any problems should occur. ? ?Please show the CHEMOTHERAPY ALERT CARD or IMMUNOTHERAPY ALERT CARD at check-in to the  Emergency Department and triage nurse. ? ?Should you have questions after your visit or need to cancel or reschedule your appointment, please contact [facility]  Dept: [phone]  and follow the prompts.  Office hours are 8:00 a.m. to 4:30 p.m. Monday - Friday. Please note that voicemails left after 4:00 p.m. may not be returned until the following business day.  We are closed weekends and major holidays. You have access to a nurse at all times for urgent questions. Please call the main number to the clinic Dept: [phone] and follow the prompts. ? ? ?For any non-urgent questions, you may also contact your provider using MyChart. We now offer e-Visits for anyone 18 and older to request care online for non-urgent symptoms. For details visit [email or web address]. ?  ?Also download the MyChart app! Go to the app store, search "MyChart", open the app, select [facility], and log in with your MyChart username and password. ? ?Due to Covid, a mask is required upon entering the hospital/clinic. If you do not have a mask, one will be given to you upon arrival. For doctor visits, patients may have 1 support person aged 18 or older with them. For treatment visits, patients cannot have anyone with them due to current Covid guidelines and our immunocompromised population.  ? ?

## 2022-01-14 NOTE — Assessment & Plan Note (Signed)
She did not bring her blood pressure readings with her but according to the patient, her systolic blood pressure is in the 130s to 140s We will monitor closely

## 2022-01-15 LAB — T4: T4, Total: 10.2 ug/dL (ref 4.5–12.0)

## 2022-01-30 ENCOUNTER — Other Ambulatory Visit (HOSPITAL_COMMUNITY): Payer: Self-pay

## 2022-02-05 ENCOUNTER — Other Ambulatory Visit: Payer: Self-pay

## 2022-02-05 ENCOUNTER — Inpatient Hospital Stay: Payer: Medicare HMO | Attending: Hematology and Oncology

## 2022-02-05 ENCOUNTER — Other Ambulatory Visit (HOSPITAL_COMMUNITY): Payer: Self-pay

## 2022-02-05 ENCOUNTER — Inpatient Hospital Stay: Payer: Medicare HMO

## 2022-02-05 ENCOUNTER — Inpatient Hospital Stay (HOSPITAL_BASED_OUTPATIENT_CLINIC_OR_DEPARTMENT_OTHER): Payer: Medicare HMO | Admitting: Hematology and Oncology

## 2022-02-05 ENCOUNTER — Encounter: Payer: Self-pay | Admitting: Hematology and Oncology

## 2022-02-05 VITALS — BP 148/55 | HR 70 | Temp 99.4°F | Resp 18 | Ht 65.0 in | Wt 200.8 lb

## 2022-02-05 DIAGNOSIS — L03211 Cellulitis of face: Secondary | ICD-10-CM | POA: Diagnosis not present

## 2022-02-05 DIAGNOSIS — C541 Malignant neoplasm of endometrium: Secondary | ICD-10-CM | POA: Diagnosis not present

## 2022-02-05 DIAGNOSIS — C7802 Secondary malignant neoplasm of left lung: Secondary | ICD-10-CM

## 2022-02-05 DIAGNOSIS — Z5112 Encounter for antineoplastic immunotherapy: Secondary | ICD-10-CM | POA: Insufficient documentation

## 2022-02-05 DIAGNOSIS — C775 Secondary and unspecified malignant neoplasm of intrapelvic lymph nodes: Secondary | ICD-10-CM | POA: Diagnosis not present

## 2022-02-05 DIAGNOSIS — L0390 Cellulitis, unspecified: Secondary | ICD-10-CM | POA: Insufficient documentation

## 2022-02-05 DIAGNOSIS — I10 Essential (primary) hypertension: Secondary | ICD-10-CM

## 2022-02-05 DIAGNOSIS — Z79899 Other long term (current) drug therapy: Secondary | ICD-10-CM | POA: Insufficient documentation

## 2022-02-05 DIAGNOSIS — D539 Nutritional anemia, unspecified: Secondary | ICD-10-CM

## 2022-02-05 LAB — CBC WITH DIFFERENTIAL (CANCER CENTER ONLY)
Abs Immature Granulocytes: 0.01 10*3/uL (ref 0.00–0.07)
Basophils Absolute: 0 10*3/uL (ref 0.0–0.1)
Basophils Relative: 0 %
Eosinophils Absolute: 0.3 10*3/uL (ref 0.0–0.5)
Eosinophils Relative: 6 %
HCT: 30.6 % — ABNORMAL LOW (ref 36.0–46.0)
Hemoglobin: 10.6 g/dL — ABNORMAL LOW (ref 12.0–15.0)
Immature Granulocytes: 0 %
Lymphocytes Relative: 17 %
Lymphs Abs: 0.9 10*3/uL (ref 0.7–4.0)
MCH: 33.3 pg (ref 26.0–34.0)
MCHC: 34.6 g/dL (ref 30.0–36.0)
MCV: 96.2 fL (ref 80.0–100.0)
Monocytes Absolute: 0.6 10*3/uL (ref 0.1–1.0)
Monocytes Relative: 12 %
Neutro Abs: 3.6 10*3/uL (ref 1.7–7.7)
Neutrophils Relative %: 65 %
Platelet Count: 159 10*3/uL (ref 150–400)
RBC: 3.18 MIL/uL — ABNORMAL LOW (ref 3.87–5.11)
RDW: 15.7 % — ABNORMAL HIGH (ref 11.5–15.5)
WBC Count: 5.5 10*3/uL (ref 4.0–10.5)
nRBC: 0 % (ref 0.0–0.2)

## 2022-02-05 LAB — CMP (CANCER CENTER ONLY)
ALT: 24 U/L (ref 0–44)
AST: 24 U/L (ref 15–41)
Albumin: 3.6 g/dL (ref 3.5–5.0)
Alkaline Phosphatase: 109 U/L (ref 38–126)
Anion gap: 6 (ref 5–15)
BUN: 15 mg/dL (ref 8–23)
CO2: 28 mmol/L (ref 22–32)
Calcium: 8.9 mg/dL (ref 8.9–10.3)
Chloride: 106 mmol/L (ref 98–111)
Creatinine: 0.82 mg/dL (ref 0.44–1.00)
GFR, Estimated: >60 mL/min (ref >60)
Glucose, Bld: 103 mg/dL — ABNORMAL HIGH (ref 70–99)
Potassium: 3.6 mmol/L (ref 3.5–5.1)
Sodium: 140 mmol/L (ref 135–145)
Total Bilirubin: 0.8 mg/dL (ref 0.3–1.2)
Total Protein: 7.2 g/dL (ref 6.5–8.1)

## 2022-02-05 LAB — TOTAL PROTEIN, URINE DIPSTICK

## 2022-02-05 LAB — TSH: TSH: 3.9 u[IU]/mL (ref 0.308–3.960)

## 2022-02-05 MED ORDER — DOXYCYCLINE HYCLATE 100 MG PO TABS
100.0000 mg | ORAL_TABLET | Freq: Two times a day (BID) | ORAL | 0 refills | Status: DC
  Filled 2022-02-05: qty 20, 10d supply, fill #0

## 2022-02-05 MED ORDER — SODIUM CHLORIDE 0.9% FLUSH
10.0000 mL | Freq: Once | INTRAVENOUS | Status: AC
  Administered 2022-02-05: 10 mL

## 2022-02-05 MED ORDER — SODIUM CHLORIDE 0.9% FLUSH
10.0000 mL | INTRAVENOUS | Status: DC | PRN
  Administered 2022-02-05: 10 mL

## 2022-02-05 MED ORDER — SODIUM CHLORIDE 0.9 % IV SOLN: Freq: Once | INTRAVENOUS | Status: AC

## 2022-02-05 MED ORDER — SODIUM CHLORIDE 0.9 % IV SOLN
200.0000 mg | Freq: Once | INTRAVENOUS | Status: AC
  Administered 2022-02-05: 200 mg via INTRAVENOUS
  Filled 2022-02-05: qty 200

## 2022-02-05 MED ORDER — HEPARIN SOD (PORK) LOCK FLUSH 100 UNIT/ML IV SOLN
500.0000 [IU] | Freq: Once | INTRAVENOUS | Status: AC | PRN
  Administered 2022-02-05: 500 [IU]

## 2022-02-05 NOTE — Assessment & Plan Note (Signed)
The patient has signs of cellulitis on her face which I suspect could be due to skin infection We will proceed with treatment without delay as this infection is mild Plan to repeat imaging study next month prior to her next treatment

## 2022-02-05 NOTE — Patient Instructions (Signed)
[facility]  Discharge Instructions: ?Thank you for choosing [facility] to provide your oncology and hematology care.  ? ?If you have a lab appointment with the Cancer Center, please go directly to the Cancer Center and check in at the registration area. ?  ?Wear comfortable clothing and clothing appropriate for easy access to any Portacath or PICC line.  ? ?We strive to give you quality time with your provider. You may need to reschedule your appointment if you arrive late (15 or more minutes).  Arriving late affects you and other patients whose appointments are after yours.  Also, if you miss three or more appointments without notifying the office, you may be dismissed from the clinic at the provider?s discretion.    ?  ?For prescription refill requests, have your pharmacy contact our office and allow 72 hours for refills to be completed.   ? ?Today you received the following chemotherapy and/or immunotherapy agents: Keytruda ?  ?To help prevent nausea and vomiting after your treatment, we encourage you to take your nausea medication as directed. ? ?BELOW ARE SYMPTOMS THAT SHOULD BE REPORTED IMMEDIATELY: ?*FEVER GREATER THAN 100.4 F (38 ?C) OR HIGHER ?*CHILLS OR SWEATING ?*NAUSEA AND VOMITING THAT IS NOT CONTROLLED WITH YOUR NAUSEA MEDICATION ?*UNUSUAL SHORTNESS OF BREATH ?*UNUSUAL BRUISING OR BLEEDING ?*URINARY PROBLEMS (pain or burning when urinating, or frequent urination) ?*BOWEL PROBLEMS (unusual diarrhea, constipation, pain near the anus) ?TENDERNESS IN MOUTH AND THROAT WITH OR WITHOUT PRESENCE OF ULCERS (sore throat, sores in mouth, or a toothache) ?UNUSUAL RASH, SWELLING OR PAIN  ?UNUSUAL VAGINAL DISCHARGE OR ITCHING  ? ?Items with * indicate a potential emergency and should be followed up as soon as possible or go to the Emergency Department if any problems should occur. ? ?Please show the CHEMOTHERAPY ALERT CARD or IMMUNOTHERAPY ALERT CARD at check-in to the  Emergency Department and triage nurse. ? ?Should you have questions after your visit or need to cancel or reschedule your appointment, please contact [facility]  Dept: [phone]  and follow the prompts.  Office hours are 8:00 a.m. to 4:30 p.m. Monday - Friday. Please note that voicemails left after 4:00 p.m. may not be returned until the following business day.  We are closed weekends and major holidays. You have access to a nurse at all times for urgent questions. Please call the main number to the clinic Dept: [phone] and follow the prompts. ? ? ?For any non-urgent questions, you may also contact your provider using MyChart. We now offer e-Visits for anyone 18 and older to request care online for non-urgent symptoms. For details visit [email or web address]. ?  ?Also download the MyChart app! Go to the app store, search "MyChart", open the app, select , and log in with your MyChart username and password. ? ?Due to Covid, a mask is required upon entering the hospital/clinic. If you do not have a mask, one will be given to you upon arrival. For doctor visits, patients may have 1 support person aged 18 or older with them. For treatment visits, patients cannot have anyone with them due to current Covid guidelines and our immunocompromised population.  ? ?

## 2022-02-05 NOTE — Assessment & Plan Note (Signed)
She has history of uncontrolled hypertension Her blood pressure is elevated today but she is not symptomatic Observe closely She will continue current dose of antihypertensives

## 2022-02-05 NOTE — Progress Notes (Signed)
[place] OFFICE PROGRESS NOTE  Patient Care Team: [name], [name], MD as PCP - General (Family Medicine) Awanda Mink Craige Cotta, RN as Oncology Nurse Navigator (Oncology)  ASSESSMENT & PLAN:  Endometrial cancer [facility]) The patient has signs of cellulitis on her face which I suspect could be due to skin infection We will proceed with treatment without delay as this infection is mild Plan to repeat imaging study next month prior to her next treatment  Cellulitis She has signs of skin cellulitis I recommend a course of doxycycline We discussed the risk and benefits of treatment  Essential hypertension She has history of uncontrolled hypertension Her blood pressure is elevated today but she is not symptomatic Observe closely She will continue current dose of antihypertensives  Deficiency anemia Her blood count is stable It is multifactorial, likely anemia of chronic illness Observe closely  Orders Placed This Encounter  Procedures   CT CHEST ABDOMEN PELVIS W CONTRAST    Standing Status:   Future    Standing Expiration Date:   02/05/2023    Order Specific Question:   Preferred imaging location?    Answer:   [facility]    Order Specific Question:   Radiology Contrast Protocol - do NOT remove file path    Answer:   [email or web address]    All questions were answered. The patient knows to call the clinic with any problems, questions or concerns. The total time spent in the appointment was 30 minutes encounter with patients including review of chart and various tests results, discussions about plan of care and coordination of care plan   [name], MD 02/05/2022 12:43 PM  INTERVAL HISTORY: Please see below for problem oriented charting. she returns for treatment follow-up  She is drinking more liquids at home She stated she had bug bites and started to have facial swelling and redness She denies fever or chills No recent  abdominal pain or changes in bowel habits  REVIEW OF SYSTEMS:   Constitutional: Denies fevers, chills or abnormal weight loss Eyes: Denies blurriness of vision Ears, nose, mouth, throat, and face: Denies mucositis or sore throat Respiratory: Denies cough, dyspnea or wheezes Gastrointestinal:  Denies nausea, heartburn or change in bowel habits Lymphatics: Denies new lymphadenopathy or easy bruising Neurological:Denies numbness, tingling or new weaknesses Behavioral/Psych: Mood is stable, no new changes  All other systems were reviewed with the patient and are negative.  I have reviewed the past medical history, past surgical history, social history and family history with the patient and they are unchanged from previous note.  ALLERGIES:  is allergic to shellfish allergy and penicillins.  MEDICATIONS:  Current Outpatient Medications  Medication Sig Dispense Refill   doxycycline (VIBRA-TABS) 100 MG tablet Take 1 tablet (100 mg total) by mouth 2 (two) times daily. 20 tablet 0   albuterol (PROAIR HFA) 108 (90 Base) MCG/ACT inhaler 2 puffs every 4 hours as needed only  if your can't catch your breath 18 g 2   bisoprolol (ZEBETA) 10 MG tablet Take 1 tablet (10 mg total) by mouth in the morning and at bedtime. 60 tablet 1   desonide (DESOWEN) 0.05 % lotion Apply topically 2 (two) times daily. Continue following with dermatologist 59 mL 0   diphenhydrAMINE (BENADRYL) 25 MG tablet Take 25 mg by mouth every 6 (six) hours as needed.     hydrochlorothiazide (HYDRODIURIL) 25 MG tablet Take 1 tablet (25 mg total) by mouth daily. 30 tablet 3   lenvatinib 20 mg daily  dose (LENVIMA) 2 x 10 MG capsule Take 1 capsule (10 mg total) by mouth daily. 30 capsule 11   levothyroxine (SYNTHROID) 100 MCG tablet Take 1 tablet (100 mcg total) by mouth daily before breakfast. 30 tablet 3   lidocaine-prilocaine (EMLA) cream Apply 1 application topically as needed. 30 g 3   loratadine (CLARITIN) 10 MG tablet Take 10 mg  by mouth daily as needed for allergies.     mometasone-formoterol (DULERA) 100-5 MCG/ACT AERO Take 2 puffs first thing in am and then another 2 puffs about 12 hours later. 3 each 3   nystatin (MYCOSTATIN/NYSTOP) powder Apply to affected area 2 (two) times daily. 60 g 0   ondansetron (ZOFRAN) 8 MG tablet Take 1 tablet (8 mg total) by mouth every 8 (eight) hours as needed for nausea. 30 tablet 3   prochlorperazine (COMPAZINE) 10 MG tablet TAKE 1 TABLET BY MOUTH EVERY 6 HOURS AS NEEDED FOR NAUSEA OR VOMITING 60 tablet 3   triamcinolone (NASACORT) 55 MCG/ACT AERO nasal inhaler Place 2 sprays into each nostril daily. 16.9 mL 12   vitamin C (ASCORBIC ACID) 500 MG tablet Take 500 mg by mouth daily.     No current facility-administered medications for this visit.   Facility-Administered Medications Ordered in Other Visits  Medication Dose Route Frequency Provider Last Rate Last Admin   sodium chloride flush (NS) 0.9 % injection 10 mL  10 mL Intracatheter PRN Alvy Bimler, Imagine Nest, MD   10 mL at 02/05/22 1231    SUMMARY OF ONCOLOGIC HISTORY: Oncology History Overview Note  ER/PR neg, MSI stable   Endometrial cancer ([place])  06/15/2018 Surgery   Surgeon: Donaciano Eva     Operation: Robotic-assisted laparoscopic total hysterectomy with bilateral salpingoophorectomy, SLN biopsy     Operative Findings:  : 6cm uterus, normal tubes and ovaries, omentum adherent to the anterior abdominal wall and uterine fundus. No suspicious nodes.   06/15/2018 Pathology Results   Procedure: Total hysterectomy with bilateral salpingo-oophorectomy. Bilateral obturator sentinel lymph node biopsies. Histologic type: Endometrioid adenocarcinoma. Histologic Grade: FIGO grade 1. Myometrial invasion: Depth of invasion: 15 mm Myometrial thickness: 20 mm Uterine Serosa Involvement: Not identified. Cervical stromal involvement: Not identified. Extent of involvement of other organs: Uninvolved. Lymphovascular invasion:  Present. Regional Lymph Nodes: Examined: 2 Sentinel 0 Non-sentinel 2 Total Lymph nodes with metastasis: 0 Isolated tumor cells (< 0.2 mm): 2 Micrometastasis: (> 0.2 mm and < 2.0 mm): 0 Macrometastasis: (> 2.0 mm): 0 Extracapsular extension: N/A. Tumor block for ancillary studies: 4Q-S. MMR / MSI testing: Will be ordered. Pathologic Stage Classification (pTNM, AJCC 8th edition): pT1b, pN0(i+)   07/20/2018 Imaging   1. Surgical changes from recent hysterectomy. No findings suspicious for residual tumor or adenopathy in the pelvis. 2. No CT findings to suggest omental, peritoneal surface or solid organ metastatic disease in the abdomen. 3. Changes consistent with known sarcoidosis in the chest with bulky calcified mediastinal and hilar lymph nodes and interstitial lung disease. I do not see any definite CT findings to suggest metastatic disease involving the chest.   08/19/2018 - 09/17/2018 Radiation Therapy   08/19/18 - 09/17/18: Vaginal Cuff, 3 cm cylinder with treatment length of 3 cm / 30 Gy delivered in 5 fractions of 6 Gy, Brachytherapy / HDR, Iridium-192   05/2005: Left Chest Wall keloid   04/2004 - 05/2004: Anus, pelvis /inguinal ( Dr. Tammi Klippel)   01/19/2020 Imaging   1. No definite CT findings of the abdomen or pelvis to explain pain. 2. The sigmoid  colon and rectum are decompressed although slightly thickened appearing, suggestive of nonspecific infectious, inflammatory, or ischemic colitis. Correlate for referable clinical symptoms. 3. There is trace, nonspecific free fluid in the low pelvis, which may be reactive, although would be difficult to distinguish from postoperative and post treatment soft tissue change in the pelvis given history of hysterectomy and rectal cancer. 4. Unchanged prominent retroperitoneal and iliac lymph nodes. No new findings suspicious for metastatic disease in the abdomen or pelvis. 5. Bibasilar pulmonary fibrosis and mediastinal and hilar lymphadenopathy, better  evaluated by prior CT chest and consistent with patient diagnosis of sarcoidosis.       04/18/2020 Imaging   Venous Doppler Left:  - No evidence of deep vein thrombosis seen in the left lower extremity, from the common femoral through the popliteal veins.  - No evidence of superficial venous thrombosis in the left lower extremity.  - There is no evidence of venous reflux seen in the left lower extremity.  - Rouleax flow noted in the superficial system.    05/18/2020 PET scan   1. Complex scan with a combination of hypermetabolic granulomatous disease (sarcoidosis) as well as hypermetabolic metastatic endometrial carcinoma. 2. Hypermetabolic mediastinal hilar lymph nodes and pulmonary nodularity with bronchovascular thickening is consistent with sarcoidosis. 3. Solitary hypermetabolic nodule in the medial LEFT lower lobe, new from prior with intense metabolic activity, is concerning for a metastatic deposit to the LEFT lower lobe. 4. Ill-defined hypermetabolic soft tissue along the LEFT operator space is most consistent with local recurrence of endometrial carcinoma. There is entrapment of the LEFT ureter by this hypermetabolic soft tissue with mild hydroureter and hydronephrosis on the LEFT. 5. Additionally there appears to be direct extension from the hypermetabolic left operator space soft tissue to the sigmoid colon with there is a well-defined hypermetabolic lesion in the colon. 6. More distant hypermetabolic peritoneal metastasis along the ventral peritoneal surface of the RIGHT abdomen beneath RIGHT rectus abdominus muscle. 7. Hypermetabolic lymph nodes in the porta hepatis are favored related to sarcoidosis.   05/19/2020 Cancer Staging   Staging form: Corpus Uteri - Carcinoma and Carcinosarcoma, AJCC 8th Edition - Clinical stage from 05/19/2020: Stage IVB (rcT1, cN2a, cM1) - Signed by [name], MD on 05/19/2020    05/26/2020 Procedure   Successful placement of a right internal jugular  approach power injectable Port-A-Cath. The catheter is ready for immediate use   05/29/2020 - 09/27/2020 Chemotherapy   The patient had carboplatin and taxol for chemotherapy treatment.     07/31/2020 Imaging   1. Asymmetric soft tissue mass extending from the left vaginal cuff has decreased in size in the interval. No residual peritoneal nodularity. Pulmonary metastases are stable with exception of a minimally improved left lower lobe nodule. 2. Stable soft tissue nodule associated with the right rectus abdominus muscle. 3. Interval resolution of left hydronephrosis. 4. Calcified mediastinal and hilar lymph nodes, together with pulmonary parenchymal fibrosis, findings which are in keeping with patient's known diagnosis of sarcoid. 5. Aortic atherosclerosis (ICD10-I70.0). Coronary artery calcification.   10/27/2020 Imaging   1. Essentially stable appearance of the soft tissue density nodule posteriorly in the left rectus abdominus muscle, currently 1.8 by 1.2 cm, previously 11 mm. 2. Stable calcified adenopathy in the mediastinum and hila, compatible with prior granulomatous disease. 3. Stable small right lower lobe pulmonary nodules. 4. Stable soft tissue density prominence along the left vaginal cuff extending up towards the adnexa, probably therapy related. 5. Other imaging findings of potential clinical significance: Coronary  atherosclerosis. Small type 1 hiatal hernia. Stable subpleural reticulation favoring the lung bases, possibly from mild fibrosis. 6. Aortic atherosclerosis.   01/19/2021 Imaging   1. Status post hysterectomy. Interval enlargement of soft tissue adjacent to the left aspect of the vaginal cuff and bladder, measuring approximately 4.3 x 2.9 cm in axial section, previously previously previously 3.7 x 1.8 cm when measured similarly. Extensive soft tissue thickening and stranding throughout the low pelvis. 2. Interval enlargement of a metastatic nodule within the right rectus  abdominus. New small peritoneal nodule anterior to the sigmoid colon. 3. Interval enlargement of retroperitoneal retroperitoneal lymph nodes. 4. Multiple small bilateral pulmonary nodules, several of which are new and enlarged. 5. Constellation of findings above is consistent with worsened metastatic disease. 6. New left hydronephrosis and hydroureter, the distal left ureter obstructed by soft tissue near the left aspect of the urinary bladder and vaginal cuff. 7. Unchanged enlarged, partially calcified mediastinal and hilar lymph nodes,, in keeping with prior granulomatous infection or sarcoidosis. 8. Mild underlying pulmonary fibrosis, in a UIP pattern. 9. Coronary artery disease.   02/02/2021 - 07/13/2021 Chemotherapy    Patient is on carboplatin and docetaxel    04/17/2021 Imaging   1. Interval decrease in size of multiple pulmonary nodules, with resolution of some previously seen nodules. 2. Interval decrease in size of enlarged retroperitoneal lymph nodes. 3. Interval decrease in size of soft tissue at the left aspect of the vaginal cuff status post hysterectomy. 4. Interval decrease in size of a soft tissue nodule within the posterior aspect of the right rectus abdominus muscle body. 5. Constellation of findings is consistent with treatment response of locally recurrent, nodal, and distant metastatic disease. 6. There has been interval resolution of previously seen left hydronephrosis and hydroureter. 7. 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   Stable mild partially calcified mediastinal and bilateral hilar lymphadenopathy.   Stable sub-cm abdominal retroperitoneal lymph nodes.   Aortic Atherosclerosis (ICD10-I70.0).     09/03/2021 -  Chemotherapy   Patient is on Treatment Plan : UTERINE Lenvatinib + Pembrolizumab q21d     11/21/2021 Imaging   Soft tissue nodule in the RIGHT  rectus muscle with enhancement (image 79/2) this measures 1.7 x 1.8 is cm not substantially changed compared to prior imaging from August and with enlargement since May of 2022 where it measured approximately 16 x 11 mm.    Signs of hysterectomy and post treatment related changes in the pelvis. No new signs of disease.   Septal thickening and ground-glass in the chest with similar appearance aside from a new area of bandlike changes in the RIGHT upper lobe favored to be post infectious or inflammatory, attention on follow-up.   Stable enlarged mediastinal and bihilar lymph nodes, partially calcified. Patient has reported history of sarcoidosis. Some of the pulmonary findings above may be related underlying sarcoidosis despite atypical distribution.   Aortic atherosclerosis.   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     02/02/2021 - 07/13/2021 Chemotherapy    Patient is on carboplatin and docetaxel    09/03/2021 -  Chemotherapy   Patient is on Treatment Plan : UTERINE Lenvatinib + Pembrolizumab q21d       PHYSICAL EXAMINATION: ECOG PERFORMANCE STATUS: 2 - Symptomatic, <50% confined to bed  Vitals:   02/05/22 1038  BP: (!) 148/55  Pulse: 70  Resp: 18  Temp: 99.4 F (37.4 C)  SpO2: 100%   Filed Weights   02/05/22 1038  Weight: 200 lb 12.8  oz (91.1 kg)    GENERAL:alert, no distress and comfortable SKIN: Noted cellulitis on her face EYES: normal, Conjunctiva are pink and non-injected, sclera clear OROPHARYNX:no exudate, no erythema and lips, buccal mucosa, and tongue normal  NECK: supple, thyroid normal size, non-tender, without nodularity LYMPH:  no palpable lymphadenopathy in the cervical, axillary or inguinal LUNGS: clear to auscultation and percussion with normal breathing effort HEART: regular rate & rhythm and no murmurs and no lower extremity edema ABDOMEN:abdomen soft, non-tender and normal bowel sounds Musculoskeletal:no cyanosis of digits and no clubbing  NEURO: alert & oriented x 3 with fluent speech, no focal motor/sensory deficits  LABORATORY DATA:  I have reviewed the data as listed    Component Value Date/Time   NA 140 02/05/2022 1005   K 3.6 02/05/2022 1005   CL 106 02/05/2022 1005   CO2 28 02/05/2022 1005   GLUCOSE 103 (H) 02/05/2022 1005   BUN 15 02/05/2022 1005   CREATININE 0.82 02/05/2022 1005   CALCIUM 8.9 02/05/2022 1005   PROT 7.2 02/05/2022 1005   ALBUMIN 3.6 02/05/2022 1005   AST 24 02/05/2022 1005   ALT 24 02/05/2022 1005   ALKPHOS 109 02/05/2022 1005   BILITOT 0.8 02/05/2022 1005   GFRNONAA >60 02/05/2022 1005   GFRAA >60 09/08/2020 0819    No results found for: SPEP, UPEP  Lab Results  Component Value Date   WBC 5.5 02/05/2022   NEUTROABS 3.6 02/05/2022   HGB 10.6 (L) 02/05/2022   HCT 30.6 (L) 02/05/2022   MCV 96.2 02/05/2022   PLT 159 02/05/2022      Chemistry      Component Value Date/Time   NA 140 02/05/2022 1005   K 3.6 02/05/2022 1005   CL 106 02/05/2022 1005   CO2 28 02/05/2022 1005   BUN 15 02/05/2022 1005   CREATININE 0.82 02/05/2022 1005      Component Value Date/Time   CALCIUM 8.9 02/05/2022 1005   ALKPHOS 109 02/05/2022 1005   AST 24 02/05/2022 1005   ALT 24 02/05/2022 1005   BILITOT 0.8 02/05/2022 1005

## 2022-02-05 NOTE — Progress Notes (Signed)
Given phone # [phone] for [employer] for patient assistance to get the medication. Instructed to call for Lenvima refill, call when she has less than 7 pills left. She verbalized understanding.

## 2022-02-05 NOTE — Assessment & Plan Note (Signed)
She has signs of skin cellulitis I recommend a course of doxycycline We discussed the risk and benefits of treatment

## 2022-02-05 NOTE — Assessment & Plan Note (Signed)
Her blood count is stable It is multifactorial, likely anemia of chronic illness Observe closely

## 2022-02-06 LAB — T4: T4, Total: 8.9 ug/dL (ref 4.5–12.0)

## 2022-02-15 ENCOUNTER — Other Ambulatory Visit: Payer: Self-pay

## 2022-02-15 ENCOUNTER — Encounter (HOSPITAL_COMMUNITY): Payer: Self-pay

## 2022-02-15 ENCOUNTER — Telehealth: Payer: Self-pay

## 2022-02-15 ENCOUNTER — Other Ambulatory Visit (HOSPITAL_COMMUNITY): Payer: Self-pay

## 2022-02-15 ENCOUNTER — Ambulatory Visit (HOSPITAL_COMMUNITY)
Admission: EM | Admit: 2022-02-15 | Discharge: 2022-02-15 | Disposition: A | Discharge status: Home / Self Care | Payer: Medicare HMO | Attending: Nurse Practitioner | Admitting: Nurse Practitioner

## 2022-02-15 DIAGNOSIS — R21 Rash and other nonspecific skin eruption: Secondary | ICD-10-CM

## 2022-02-15 MED ORDER — CETIRIZINE HCL 10 MG PO TABS
10.0000 mg | ORAL_TABLET | Freq: Every day | ORAL | 0 refills | Status: AC
  Filled 2022-02-15: qty 30, 30d supply, fill #0

## 2022-02-15 MED ORDER — TRIAMCINOLONE ACETONIDE 0.025 % EX CREA
1.0000 "application " | TOPICAL_CREAM | Freq: Two times a day (BID) | CUTANEOUS | 0 refills | Status: DC
  Filled 2022-02-15: qty 30, 15d supply, fill #0

## 2022-02-15 MED ORDER — FAMOTIDINE 20 MG PO TABS
20.0000 mg | ORAL_TABLET | Freq: Every day | ORAL | 0 refills | Status: DC
  Filled 2022-02-15: qty 30, 30d supply, fill #0

## 2022-02-15 NOTE — Telephone Encounter (Signed)
Called and given below message. She verbalized understanding and will go to urgent care today. ?

## 2022-02-15 NOTE — ED Provider Notes (Signed)
[facility]    CSN: [id number] Arrival date & time: 02/15/22  1213      History   Chief Complaint Chief Complaint  Patient presents with   Allergic Reaction    HPI [name] is a 66 y.o. female.   Patient is a 66 year old female who presents with a rash to her face.  Symptoms have been present for the past week.  States that she went to her oncologist about a week ago and had an infection on her skin she states that her face was already like this prior to going to the oncologist.  She was prescribed doxycycline but symptoms have not improved.  She continues to have redness, swelling, and itching to both her cheeks around her eyes and on her forehead.  She states she has tried Neosporin on a couple of occasions without any relief.  She states that she did try to contact her oncologist this morning but they were unable to see her she was told to follow-up here.  She also informs that she was told to take Benadryl to help with itching.  She denies shortness of breath, difficulty breathing, throat swelling, or tongue swelling.  She reports this is happened to her once in the past and she was given doxycycline which helped her symptoms at that time.  Her oncologist has chosen not to prescribe steroids because the patient has a history of endometrial cancer and is immunocompromised.   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   Other fatigue 06/22/2020   Pancytopenia, acquired ([place]) 06/20/2020   Goals of care, counseling/discussion 06/01/2020   Lung nodule seen on imaging study 05/19/2020   Nausea and vomiting 05/19/2020   Hydroureter on left 05/18/2020   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   Cancer associated pain 05/11/2020   Other constipation 05/11/2020  Squamous cell cancer, anus ([facility])    Sinusitis, acute maxillary 12/15/2018   DOE (dyspnea on exertion) 09/17/2018   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   Endometrial cancer ([place]) 05/26/2018   Pain in right hand 05/12/2018   Cervical intraepithelial neoplasia 05/06/2018   Poor dentition 07/07/2017   Morbid obesity due to excess calories ([place]) complicated by hbp 67/11/4579   Cough 03/17/2017   Rash and nonspecific skin eruption 02/18/2017   Hyperlipidemia, mixed 02/17/2017   Perianal condylomata 08/12/2016   Genital herpes 02/09/2015   Fever and chills 01/03/2014   Wheezing 01/03/2014   Bifid uvula    Bilateral lower  extremity edema 09/09/2011   Osteopenia 07/18/2011   Upper airway cough syndrome vs cough variant asthma  06/24/2011   Sarcoidosis ([place]) 03/07/2008   BMI 38.0-38.9,adult 03/07/2008   Essential hypertension 03/07/2008   G E R D 03/07/2008   History of anal cancer 12/17/2003    Past Surgical History:  Procedure Laterality Date   BRONCHOSCOPY  01/22/2008   w/ Lavage and bronchial bx   CARDIOVASCULAR STRESS TEST  06/27/2006   normal nuclear study w/ no ischemia/  normal LV function and wall motion , ef 61%   CARPAL TUNNEL RELEASE Right 01/17/2016   and finger trigger release   CLEFT LIP REPAIR     CO2 LASER ABLATION VULVA     EUA/  EXCISIONAL MULTIPLE PERINEAL/ANAL BX'S/  SIGMOIDSCOPY/  PORT-A-CATH PLACEMENT  03/27/2004   Local excision - Dr Lennie Hummer   IR IMAGING GUIDED PORT INSERTION  05/26/2020   LAPAROSCOPIC CHOLECYSTECTOMY  08/14/2008   MULTIPLE ANAL BX'S  10/10/2006   RELEASE A1 PULLEY THUMB Bilateral left 12-17-2006/  right 09-05-2009   REMOVAL PORT-A-CATH/  EUA/  MULTIPLE ANAL BX'S  10/16/2004   ROBOTIC ASSISTED TOTAL HYSTERECTOMY WITH BILATERAL SALPINGO OOPHERECTOMY Bilateral 06/25/2018   Procedure: XI ROBOTIC ASSISTED TOTAL HYSTERECTOMY WITH BILATERAL SALPINGO OOPHORECTOMY;  Surgeon: Everitt Amber, MD;  Location: [facility];  Service: Gynecology;  Laterality: Bilateral;   SENTINEL NODE BIOPSY N/A 06/25/2018   Procedure: SENTINEL NODE BIOPSY;  Surgeon: Everitt Amber, MD;  Location: [facility];  Service: Gynecology;  Laterality: N/A;   TENNIS ELBOW RELEASE/NIRSCHEL PROCEDURE Right 11/28/2016   Procedure: RIGHT ELBOW ULNER NERVE RELEASE AND OR TRANSPOSITION;  Surgeon: Iran Planas, MD;  Location: [facility];  Service: Orthopedics;  Laterality: Right;    OB History   No obstetric history on file.      Home Medications    Prior to Admission medications   Medication Sig Start Date End Date Taking? Authorizing Provider  albuterol (PROAIR HFA) 108 (90 Base) MCG/ACT inhaler  2 puffs every 4 hours as needed only  if your can't catch your breath 10/31/21   Tanda Rockers, MD  bisoprolol (ZEBETA) 10 MG tablet Take 1 tablet (10 mg total) by mouth in the morning and at bedtime. 12/12/21   [name], MD  desonide (DESOWEN) 0.05 % lotion Apply topically 2 (two) times daily. Continue following with dermatologist 09/29/19   [name], [name], MD  diphenhydrAMINE (BENADRYL) 25 MG tablet Take 25 mg by mouth every 6 (six) hours as needed.    [provider]  doxycycline (VIBRA-TABS) 100 MG tablet Take 1 tablet (100 mg total) by mouth 2 (two) times daily. 02/05/22   [name], MD  hydrochlorothiazide (HYDRODIURIL) 25 MG tablet Take 1 tablet (25 mg total) by mouth daily. 12/04/21   [name], MD  lenvatinib 20 mg daily dose (LENVIMA) 2 x 10 MG capsule  Take 1 capsule (10 mg total) by mouth daily. 08/22/21   [name], MD  levothyroxine (SYNTHROID) 100 MCG tablet Take 1 tablet (100 mcg total) by mouth daily before breakfast. 12/21/21   [name], MD  lidocaine-prilocaine (EMLA) cream Apply 1 application topically as needed. 09/25/21   [name], MD  loratadine (CLARITIN) 10 MG tablet Take 10 mg by mouth daily as needed for allergies.    [provider]  mometasone-formoterol (DULERA) 100-5 MCG/ACT AERO Take 2 puffs first thing in am and then another 2 puffs about 12 hours later. 10/31/21   Tanda Rockers, MD  nystatin (MYCOSTATIN/NYSTOP) powder Apply to affected area 2 (two) times daily. 09/25/21   [name], MD  ondansetron (ZOFRAN) 8 MG tablet Take 1 tablet (8 mg total) by mouth every 8 (eight) hours as needed for nausea. 05/19/20   [name], MD  prochlorperazine (COMPAZINE) 10 MG tablet TAKE 1 TABLET BY MOUTH EVERY 6 HOURS AS NEEDED FOR NAUSEA OR VOMITING 02/02/21 02/02/22  [name], MD  triamcinolone (NASACORT) 55 MCG/ACT AERO nasal inhaler Place 2 sprays into each nostril daily. 11/22/21   [name], MD  vitamin C (ASCORBIC ACID) 500 MG tablet Take 500  mg by mouth daily.    [provider]    Family History Family History  Problem Relation Age of Onset   Allergies Mother    Asthma Mother    Lung cancer Mother    Allergies Sister    Allergies Brother    Asthma Brother    Asthma Sister    Sarcoidosis Neg Hx    Colon cancer Neg Hx    Liver cancer Neg Hx    Stomach cancer Neg Hx    Esophageal cancer Neg Hx    Rectal cancer Neg Hx     Social History Social History   Tobacco Use   Smoking status: Former    Packs/day: 0.50    Years: 1.00    Pack years: 0.50    Types: Cigarettes    Quit date: 12/16/1993    Years since quitting: 28.1   Smokeless tobacco: Never  Vaping Use   Vaping Use: Never used  Substance Use Topics   Alcohol use: Yes    Comment: occasional   Drug use: No     Allergies   Shellfish allergy and Penicillins   Review of Systems Review of Systems  Constitutional: Negative.   HENT:  Positive for facial swelling.   Eyes:  Negative for photophobia, pain, discharge and redness.       Redness and erythema around both eyes.  Skin:  Positive for color change and rash.  Allergic/Immunologic: Positive for immunocompromised state.  Psychiatric/Behavioral: Negative.      Physical Exam Triage Vital Signs ED Triage Vitals  Enc Vitals Group     BP 02/15/22 1340 (!) 197/87     Pulse Rate 02/15/22 1340 63     Resp 02/15/22 1340 18     Temp 02/15/22 1340 98.1 F (36.7 C)     Temp Source 02/15/22 1340 Oral     SpO2 02/15/22 1340 97 %     Weight --      Height --      Head Circumference --      Peak Flow --      Pain Score 02/15/22 1339 0     Pain Loc --      Pain Edu? --      Excl. in [place]? --  No data found.  Updated Vital Signs BP (!) 171/94 (BP Location: Left Arm) Comment: Provider made aware   Pulse 63    Temp 98.1 F (36.7 C) (Oral)    Resp 18    LMP 12/17/2003    SpO2 97%   Visual Acuity Right Eye Distance:   Left Eye Distance:   Bilateral Distance:    Right Eye Near:   Left  Eye Near:    Bilateral Near:     Physical Exam Constitutional:      General: She is not in acute distress.    Appearance: She is normal weight.  HENT:     Head: Normocephalic and atraumatic.     Mouth/Throat:     Mouth: Mucous membranes are moist.  Eyes:     Extraocular Movements: Extraocular movements intact.     Pupils: Pupils are equal, round, and reactive to light.  Pulmonary:     Effort: Pulmonary effort is normal.     Breath sounds: Normal breath sounds.  Musculoskeletal:     Cervical back: Normal range of motion.  Skin:    General: Skin is warm and dry.     Findings: Erythema and rash present. Rash is urticarial.     Comments: Urticarial rash to bilateral infraorbital regions, bilateral periorbital regions,and the forehead. Rash is erythematous with defined borders. Pattern is congruent on the bilateral infraorbital and periorbital regions. No lesions, scaling, purpura or crusting.   Neurological:     Mental Status: She is alert.     UC Treatments / Results  Labs (all labs ordered are listed, but only abnormal results are displayed) Labs Reviewed - No data to display  EKG   Radiology No results found.  Procedures Procedures (including critical care time)  Medications Ordered in UC Medications - No data to display  Initial Impression / Assessment and Plan / UC Course  I have reviewed the triage vital signs and the nursing notes.  Pertinent labs & imaging results that were available during my care of the patient were reviewed by me and considered in my medical decision making (see chart for details).  Symptoms do not appear to be related to the doxycycline as she had the rash prior to starting the medication.  It is difficult to determine the trigger for her symptoms, but noting that she is immunocompromised which may contribute.  Will prescribe famotidine, Zyrtec, and a topical steroid to help with her symptoms.  Discussion with the patient regarding the use of  the topical steroid to the face and how prolonged use will call lightening of these areas.  Use sparingly, and as needed.  Patient can take Benadryl for more severe itching if needed.  May also apply cool compresses to the affected areas to help with itching and for comfort. Patient encouraged to follow-up with her primary care or oncology if his symptoms worsen or do not improve.  Patient with elevated blood pressure today, will recheck prior to discharge.  We will also have patient follow-up with her primary care regarding her elevated blood pressure.  She is asymptomatic, as she denies chest pain, shortness of breath, difficulty breathing, or blurred vision. Final Clinical Impressions(s) / UC Diagnoses   Final diagnoses:  None   Discharge Instructions   None    ED Prescriptions   None    PDMP not reviewed this encounter.   Tish Men, NP 02/15/22 1420

## 2022-02-15 NOTE — ED Triage Notes (Addendum)
Pt c/o allergic reaction (facial redness, and itching). Patient states she is an oncology patient and was prescribed doxycycline.  ?Started: a week ago ? ?Pt denies SOB and denies itching to throat.  ?

## 2022-02-15 NOTE — Telephone Encounter (Signed)
She can go to urgent care or PCP if not better  ?I do not have room to add her in to evaluate until her appt ?

## 2022-02-15 NOTE — Telephone Encounter (Signed)
Returned her call. Her face is red, swollen and has a rash all over. She said that today her face looks bad. She is complaining of itching to her face and thinks she scratched it during the night.  She is still taking the antibiotics and taking benadryl just at night. ?Instructed to take benadryl now and I will call her back. She verbalized understanding. ?

## 2022-02-15 NOTE — Discharge Instructions (Addendum)
Take medication as prescribed. ?May apply cool compresses to the affected areas to help with comfort and itching. ?As discussed, may consider mixing Aquaphor with the triamcinolone cream and applying to the face to avoid lightening of the skin. ?Follow-up with PCP regarding elevated blood pressure. ?Follow-up if symptoms worsen or do not improve. ? ?

## 2022-02-16 ENCOUNTER — Emergency Department (HOSPITAL_COMMUNITY)
Admission: EM | Admit: 2022-02-16 | Discharge: 2022-02-16 | Disposition: A | Discharge status: Home / Self Care | Payer: Medicare HMO | Attending: Emergency Medicine | Admitting: Emergency Medicine

## 2022-02-16 ENCOUNTER — Encounter (HOSPITAL_COMMUNITY): Payer: Self-pay | Admitting: Emergency Medicine

## 2022-02-16 ENCOUNTER — Other Ambulatory Visit (HOSPITAL_COMMUNITY): Payer: Self-pay

## 2022-02-16 ENCOUNTER — Other Ambulatory Visit: Payer: Self-pay

## 2022-02-16 DIAGNOSIS — Z79899 Other long term (current) drug therapy: Secondary | ICD-10-CM | POA: Diagnosis not present

## 2022-02-16 DIAGNOSIS — R21 Rash and other nonspecific skin eruption: Secondary | ICD-10-CM | POA: Diagnosis not present

## 2022-02-16 MED ORDER — PREDNISONE 10 MG (21) PO TBPK: ORAL_TABLET | Freq: Every day | ORAL | 0 refills | Status: DC

## 2022-02-16 MED ORDER — PREDNISONE 10 MG PO TABS
ORAL_TABLET | ORAL | 0 refills | Status: AC
  Filled 2022-02-16: qty 42, 12d supply, fill #0

## 2022-02-16 NOTE — ED Triage Notes (Signed)
Patient reports a facial rash that has been ongoing, has tried creams and Pepcid w/o relief, it just made it worse.  ?

## 2022-02-16 NOTE — Discharge Instructions (Signed)
Take prednisone as prescribed ?Discontinue the topical steroid cream. ?Follow up with your oncologist on Monday ?

## 2022-02-16 NOTE — ED Provider Notes (Signed)
?[facility] DEPT ?Provider Note ? ? ?CSN: [id number] ?Arrival date & time: 02/16/22  1347 ? ?  ? ?History ? ?Chief Complaint  ?Patient presents with  ? facial rash  ? ? ?[name] is a 66 y.o. female. ? ?66 year old female currently on chemo, last dose 02/12/2022 presents with rash to the face.  Patient states her rash started on the 28th on her cheeks, has since spread up to her eyelids and down towards her jaw.  Patient's oncologist started her on doxycycline which was not helping.  Patient presented to urgent care yesterday was given topical steroid as well as advised to take Pepcid and Zyrtec.  Reports burning and itching pain which is not relieved with medications provided already.  Denies difficulty breathing or swallowing, no new medications otherwise.  No other complaints or concerns. ? ? ?  ? ?Home Medications ?Prior to Admission medications   ?Medication Sig Start Date End Date Taking? Authorizing Provider  ?predniSONE (STERAPRED UNI-PAK 21 TAB) 10 MG (21) TBPK tablet Take by mouth daily. Take 6 tabs by mouth daily  for 2 days, then 5 tabs for 2 days, then 4 tabs for 2 days, then 3 tabs for 2 days, 2 tabs for 2 days, then 1 tab by mouth daily for 2 days 02/16/22  Yes Tacy Learn, PA-C  ?albuterol (PROAIR HFA) 108 (90 Base) MCG/ACT inhaler 2 puffs every 4 hours as needed only  if your can't catch your breath 10/31/21   Tanda Rockers, MD  ?bisoprolol (ZEBETA) 10 MG tablet Take 1 tablet (10 mg total) by mouth in the morning and at bedtime. 12/12/21   [name], MD  ?cetirizine (ALLERGY, CETIRIZINE,) 10 MG tablet Take 1 tablet (10 mg total) by mouth daily. 02/15/22 03/17/22  Leath-Warren, Alda Lea, NP  ?desonide (DESOWEN) 0.05 % lotion Apply topically 2 (two) times daily. Continue following with dermatologist 09/29/19   [name], [name], MD  ?diphenhydrAMINE (BENADRYL) 25 MG tablet Take 25 mg by mouth every 6 (six) hours as needed.    [provider]  ?doxycycline  (VIBRA-TABS) 100 MG tablet Take 1 tablet (100 mg total) by mouth 2 (two) times daily. 02/05/22   [name], MD  ?famotidine (PEPCID) 20 MG tablet Take 1 tablet (20 mg total) by mouth daily for 10 days. 02/15/22 03/17/22  Leath-Warren, Alda Lea, NP  ?hydrochlorothiazide (HYDRODIURIL) 25 MG tablet Take 1 tablet (25 mg total) by mouth daily. 12/04/21   [name], MD  ?lenvatinib 20 mg daily dose (LENVIMA) 2 x 10 MG capsule Take 1 capsule (10 mg total) by mouth daily. 08/22/21   [name], MD  ?levothyroxine (SYNTHROID) 100 MCG tablet Take 1 tablet (100 mcg total) by mouth daily before breakfast. 12/21/21   [name], MD  ?lidocaine-prilocaine (EMLA) cream Apply 1 application topically as needed. 09/25/21   [name], MD  ?loratadine (CLARITIN) 10 MG tablet Take 10 mg by mouth daily as needed for allergies.    [provider]  ?mometasone-formoterol (DULERA) 100-5 MCG/ACT AERO Take 2 puffs first thing in am and then another 2 puffs about 12 hours later. 10/31/21   Tanda Rockers, MD  ?nystatin (MYCOSTATIN/NYSTOP) powder Apply to affected area 2 (two) times daily. 09/25/21   [name], MD  ?ondansetron (ZOFRAN) 8 MG tablet Take 1 tablet (8 mg total) by mouth every 8 (eight) hours as needed for nausea. 05/19/20   [name], MD  ?prochlorperazine (COMPAZINE) 10 MG tablet TAKE 1 TABLET BY  MOUTH EVERY 6 HOURS AS NEEDED FOR NAUSEA OR VOMITING 02/02/21 02/02/22  [name], MD  ?triamcinolone (NASACORT) 55 MCG/ACT AERO nasal inhaler Place 2 sprays into each nostril daily. 11/22/21   [name], MD  ?vitamin C (ASCORBIC ACID) 500 MG tablet Take 500 mg by mouth daily.    [provider]  ?   ? ?Allergies    ?Shellfish allergy and Penicillins   ? ?Review of Systems   ?Review of Systems ?Negative except as per HPI ?Physical Exam ?Updated Vital Signs ?BP 140/90 (BP Location: Left Arm)   Pulse 86   Temp 98.7 ?F (37.1 ?C) (Oral)   Resp 16   LMP 12/17/2003   SpO2 100%  ?Physical Exam ?Vitals and nursing  note reviewed.  ?Constitutional:   ?   General: She is not in acute distress. ?   Appearance: She is well-developed. She is not diaphoretic.  ?HENT:  ?   Head: Normocephalic and atraumatic.  ?   Nose: Nose normal.  ?   Mouth/Throat:  ?   Mouth: Mucous membranes are moist.  ?Eyes:  ?   Extraocular Movements: Extraocular movements intact.  ?   Pupils: Pupils are equal, round, and reactive to light.  ?Pulmonary:  ?   Effort: Pulmonary effort is normal.  ?Musculoskeletal:  ?   Cervical back: Neck supple.  ?Lymphadenopathy:  ?   Cervical: No cervical adenopathy.  ?Skin: ?   General: Skin is warm and dry.  ?   Findings: Rash present.  ?   Comments: Rash to face as pictured, spares central [place], extending up towards forehead and down towards left and right jaw areas.  No oropharyngeal swelling.  ?Neurological:  ?   Mental Status: She is alert and oriented to person, place, and time.  ?Psychiatric:     ?   Behavior: Behavior normal.  ? ? ? ? ? ?ED Results / Procedures / Treatments   ?Labs ?(all labs ordered are listed, but only abnormal results are displayed) ?Labs Reviewed - No data to display ? ?EKG ?None ? ?Radiology ?No results found. ? ?Procedures ?Procedures  ? ? ?Medications Ordered in ED ?Medications - No data to display ? ?ED Course/ Medical Decision Making/ A&P ?  ?                        ?Medical Decision Making ? ?66 year old female presents with rash on face as above.  No new medications, last chemo was earlier this week. No improvement with Doxy, topical steroid, zyrtec, pepcid. ?Discussed with Dr. Alvino Chapel and Eulis Foster, ER attending, rash thought to be likely related to her chemo, advised to dc the topical steroid, start oral steroid and follow up with her oncologist.  ? ? ? ? ? ? ? ?Final Clinical Impression(s) / ED Diagnoses ?Final diagnoses:  ?Facial rash  ? ? ?Rx / DC Orders ?ED Discharge Orders   ? ?      Ordered  ?  predniSONE (STERAPRED UNI-PAK 21 TAB) 10 MG (21) TBPK tablet  Daily       ? 02/16/22  1418  ? ?  ?  ? ?  ? ? ?  ?Tacy Learn, PA-C ?02/16/22 1444 ? ?  ?Davonna Belling, MD ?02/16/22 1525 ? ?

## 2022-02-17 ENCOUNTER — Emergency Department (HOSPITAL_COMMUNITY): Payer: Medicare HMO

## 2022-02-17 ENCOUNTER — Other Ambulatory Visit: Payer: Self-pay

## 2022-02-17 ENCOUNTER — Observation Stay (HOSPITAL_COMMUNITY)
Admission: EM | Admit: 2022-02-17 | Discharge: 2022-02-18 | Disposition: A | Discharge status: Home / Self Care | Payer: Medicare HMO | Attending: Internal Medicine | Admitting: Internal Medicine

## 2022-02-17 ENCOUNTER — Encounter (HOSPITAL_COMMUNITY): Payer: Self-pay | Admitting: Emergency Medicine

## 2022-02-17 DIAGNOSIS — Z85048 Personal history of other malignant neoplasm of rectum, rectosigmoid junction, and anus: Secondary | ICD-10-CM | POA: Insufficient documentation

## 2022-02-17 DIAGNOSIS — Z79630 Long term (current) use of alkylating agent: Secondary | ICD-10-CM | POA: Diagnosis not present

## 2022-02-17 DIAGNOSIS — R600 Localized edema: Secondary | ICD-10-CM | POA: Insufficient documentation

## 2022-02-17 DIAGNOSIS — D6859 Other primary thrombophilia: Secondary | ICD-10-CM | POA: Insufficient documentation

## 2022-02-17 DIAGNOSIS — I639 Cerebral infarction, unspecified: Secondary | ICD-10-CM | POA: Diagnosis not present

## 2022-02-17 DIAGNOSIS — R4701 Aphasia: Secondary | ICD-10-CM | POA: Diagnosis present

## 2022-02-17 DIAGNOSIS — I10 Essential (primary) hypertension: Secondary | ICD-10-CM | POA: Diagnosis not present

## 2022-02-17 DIAGNOSIS — R2681 Unsteadiness on feet: Secondary | ICD-10-CM | POA: Diagnosis not present

## 2022-02-17 DIAGNOSIS — Z20822 Contact with and (suspected) exposure to covid-19: Secondary | ICD-10-CM | POA: Insufficient documentation

## 2022-02-17 DIAGNOSIS — I6781 Acute cerebrovascular insufficiency: Principal | ICD-10-CM | POA: Insufficient documentation

## 2022-02-17 DIAGNOSIS — Z79899 Other long term (current) drug therapy: Secondary | ICD-10-CM | POA: Insufficient documentation

## 2022-02-17 DIAGNOSIS — C541 Malignant neoplasm of endometrium: Secondary | ICD-10-CM | POA: Insufficient documentation

## 2022-02-17 DIAGNOSIS — D869 Sarcoidosis, unspecified: Secondary | ICD-10-CM | POA: Insufficient documentation

## 2022-02-17 DIAGNOSIS — R21 Rash and other nonspecific skin eruption: Secondary | ICD-10-CM

## 2022-02-17 DIAGNOSIS — L03211 Cellulitis of face: Secondary | ICD-10-CM | POA: Insufficient documentation

## 2022-02-17 DIAGNOSIS — D860 Sarcoidosis of lung: Secondary | ICD-10-CM | POA: Diagnosis present

## 2022-02-17 DIAGNOSIS — R29818 Other symptoms and signs involving the nervous system: Secondary | ICD-10-CM | POA: Diagnosis not present

## 2022-02-17 DIAGNOSIS — Z87891 Personal history of nicotine dependence: Secondary | ICD-10-CM | POA: Insufficient documentation

## 2022-02-17 DIAGNOSIS — Z8673 Personal history of transient ischemic attack (TIA), and cerebral infarction without residual deficits: Secondary | ICD-10-CM | POA: Diagnosis present

## 2022-02-17 LAB — COMPREHENSIVE METABOLIC PANEL
ALT: 24 U/L (ref 0–44)
AST: 23 U/L (ref 15–41)
Albumin: 3.9 g/dL (ref 3.5–5.0)
Alkaline Phosphatase: 107 U/L (ref 38–126)
Anion gap: 10 (ref 5–15)
BUN: 18 mg/dL (ref 8–23)
CO2: 27 mmol/L (ref 22–32)
Calcium: 8.9 mg/dL (ref 8.9–10.3)
Chloride: 100 mmol/L (ref 98–111)
Creatinine, Ser: 0.89 mg/dL (ref 0.44–1.00)
GFR, Estimated: >60 mL/min (ref >60)
Glucose, Bld: 141 mg/dL — ABNORMAL HIGH (ref 70–99)
Potassium: 3.5 mmol/L (ref 3.5–5.1)
Sodium: 137 mmol/L (ref 135–145)
Total Bilirubin: 0.5 mg/dL (ref 0.3–1.2)
Total Protein: 8 g/dL (ref 6.5–8.1)

## 2022-02-17 LAB — URINALYSIS, ROUTINE W REFLEX MICROSCOPIC
Bilirubin Urine: NEGATIVE
Glucose, UA: NEGATIVE mg/dL
Hgb urine dipstick: NEGATIVE
Ketones, ur: NEGATIVE mg/dL
Nitrite: NEGATIVE
Protein, ur: NEGATIVE mg/dL
Specific Gravity, Urine: 1.015 (ref 1.005–1.030)
pH: 6 (ref 5.0–8.0)

## 2022-02-17 LAB — CBC
HCT: 34.7 % — ABNORMAL LOW (ref 36.0–46.0)
Hemoglobin: 12 g/dL (ref 12.0–15.0)
MCH: 33.7 pg (ref 26.0–34.0)
MCHC: 34.6 g/dL (ref 30.0–36.0)
MCV: 97.5 fL (ref 80.0–100.0)
Platelets: 214 10*3/uL (ref 150–400)
RBC: 3.56 MIL/uL — ABNORMAL LOW (ref 3.87–5.11)
RDW: 15.8 % — ABNORMAL HIGH (ref 11.5–15.5)
WBC: 7 10*3/uL (ref 4.0–10.5)
nRBC: 0 % (ref 0.0–0.2)

## 2022-02-17 LAB — DIFFERENTIAL
Abs Immature Granulocytes: 0.02 10*3/uL (ref 0.00–0.07)
Basophils Absolute: 0 10*3/uL (ref 0.0–0.1)
Basophils Relative: 0 %
Eosinophils Absolute: 0 10*3/uL (ref 0.0–0.5)
Eosinophils Relative: 0 %
Immature Granulocytes: 0 %
Lymphocytes Relative: 15 %
Lymphs Abs: 1.1 10*3/uL (ref 0.7–4.0)
Monocytes Absolute: 0.5 10*3/uL (ref 0.1–1.0)
Monocytes Relative: 7 %
Neutro Abs: 5.4 10*3/uL (ref 1.7–7.7)
Neutrophils Relative %: 78 %

## 2022-02-17 LAB — RESP PANEL BY RT-PCR (FLU A&B, COVID) ARPGX2
Influenza A by PCR: NEGATIVE
Influenza B by PCR: NEGATIVE
SARS Coronavirus 2 by RT PCR: NEGATIVE

## 2022-02-17 LAB — RAPID URINE DRUG SCREEN, HOSP PERFORMED
Amphetamines: NOT DETECTED
Barbiturates: NOT DETECTED
Benzodiazepines: NOT DETECTED
Cocaine: NOT DETECTED
Opiates: NOT DETECTED
Tetrahydrocannabinol: NOT DETECTED

## 2022-02-17 LAB — APTT: aPTT: 27 seconds (ref 24–36)

## 2022-02-17 LAB — PROTIME-INR
INR: 1 (ref 0.8–1.2)
Prothrombin Time: 13.5 seconds (ref 11.4–15.2)

## 2022-02-17 LAB — ETHANOL: Alcohol, Ethyl (B): <10 mg/dL (ref <10)

## 2022-02-17 MED ORDER — ALBUTEROL SULFATE (2.5 MG/3ML) 0.083% IN NEBU: 2.5000 mg | INHALATION_SOLUTION | RESPIRATORY_TRACT | Status: DC | PRN

## 2022-02-17 MED ORDER — ACETAMINOPHEN 500 MG PO TABS: 500.0000 mg | ORAL_TABLET | Freq: Four times a day (QID) | ORAL | Status: DC | PRN

## 2022-02-17 MED ORDER — PREDNISONE 50 MG PO TABS
50.0000 mg | ORAL_TABLET | Freq: Every day | ORAL | Status: DC
  Administered 2022-02-18: 50 mg via ORAL
  Filled 2022-02-17: qty 1

## 2022-02-17 MED ORDER — HYDROCHLOROTHIAZIDE 25 MG PO TABS
25.0000 mg | ORAL_TABLET | Freq: Every day | ORAL | Status: DC
  Administered 2022-02-17 – 2022-02-18 (×2): 25 mg via ORAL
  Filled 2022-02-17: qty 2
  Filled 2022-02-17: qty 1

## 2022-02-17 MED ORDER — LEVOTHYROXINE SODIUM 100 MCG PO TABS
100.0000 ug | ORAL_TABLET | Freq: Every day | ORAL | Status: DC
  Administered 2022-02-18: 100 ug via ORAL
  Filled 2022-02-17: qty 1

## 2022-02-17 MED ORDER — PREDNISONE 5 MG PO TABS: 30.0000 mg | ORAL_TABLET | Freq: Every day | ORAL | Status: DC

## 2022-02-17 MED ORDER — LENVATINIB (20 MG DAILY DOSE) 2 X 10 MG PO CPPK: 10.0000 mg | ORAL_CAPSULE | Freq: Every day | ORAL | Status: DC

## 2022-02-17 MED ORDER — CLOPIDOGREL BISULFATE 75 MG PO TABS
75.0000 mg | ORAL_TABLET | Freq: Once | ORAL | Status: AC
  Administered 2022-02-17: 75 mg via ORAL
  Filled 2022-02-17: qty 1

## 2022-02-17 MED ORDER — PREDNISONE 20 MG PO TABS: 20.0000 mg | ORAL_TABLET | Freq: Every day | ORAL | Status: DC

## 2022-02-17 MED ORDER — ENOXAPARIN SODIUM 40 MG/0.4ML IJ SOSY
40.0000 mg | PREFILLED_SYRINGE | INTRAMUSCULAR | Status: DC
  Administered 2022-02-17: 40 mg via SUBCUTANEOUS
  Filled 2022-02-17: qty 0.4

## 2022-02-17 MED ORDER — TRIAMCINOLONE ACETONIDE 55 MCG/ACT NA AERO
2.0000 | INHALATION_SPRAY | Freq: Two times a day (BID) | NASAL | Status: DC | PRN
  Filled 2022-02-17: qty 21.6

## 2022-02-17 MED ORDER — PREDNISONE 5 MG PO TABS: 10.0000 mg | ORAL_TABLET | Freq: Every day | ORAL | Status: DC

## 2022-02-17 MED ORDER — HYDRALAZINE HCL 10 MG PO TABS: 10.0000 mg | ORAL_TABLET | Freq: Three times a day (TID) | ORAL | Status: DC | PRN

## 2022-02-17 MED ORDER — BISOPROLOL FUMARATE 5 MG PO TABS
10.0000 mg | ORAL_TABLET | Freq: Two times a day (BID) | ORAL | Status: DC
  Filled 2022-02-17: qty 2

## 2022-02-17 MED ORDER — PREDNISONE 20 MG PO TABS: 40.0000 mg | ORAL_TABLET | Freq: Every day | ORAL | Status: DC

## 2022-02-17 MED ORDER — LORATADINE 10 MG PO TABS
10.0000 mg | ORAL_TABLET | Freq: Every day | ORAL | Status: DC
  Administered 2022-02-18: 10 mg via ORAL
  Filled 2022-02-17: qty 1

## 2022-02-17 MED ORDER — ASPIRIN 81 MG PO CHEW
81.0000 mg | CHEWABLE_TABLET | Freq: Once | ORAL | Status: AC
  Administered 2022-02-17: 81 mg via ORAL
  Filled 2022-02-17: qty 1

## 2022-02-17 MED ORDER — DIPHENHYDRAMINE HCL 25 MG PO CAPS
25.0000 mg | ORAL_CAPSULE | Freq: Four times a day (QID) | ORAL | Status: DC | PRN
  Administered 2022-02-17: 25 mg via ORAL
  Filled 2022-02-17: qty 1

## 2022-02-17 MED ORDER — MOMETASONE FURO-FORMOTEROL FUM 100-5 MCG/ACT IN AERO
2.0000 | INHALATION_SPRAY | Freq: Two times a day (BID) | RESPIRATORY_TRACT | Status: DC
  Filled 2022-02-17: qty 8.8

## 2022-02-17 MED ORDER — DOXYCYCLINE HYCLATE 100 MG PO TABS
100.0000 mg | ORAL_TABLET | Freq: Two times a day (BID) | ORAL | Status: DC
  Administered 2022-02-17 – 2022-02-18 (×2): 100 mg via ORAL
  Filled 2022-02-17 (×2): qty 1

## 2022-02-17 MED ORDER — LABETALOL HCL 5 MG/ML IV SOLN
10.0000 mg | Freq: Once | INTRAVENOUS | Status: AC
  Administered 2022-02-17: 10 mg via INTRAVENOUS
  Filled 2022-02-17: qty 4

## 2022-02-17 MED ORDER — DESONIDE 0.05 % EX LOTN: 1.0000 "application " | TOPICAL_LOTION | Freq: Two times a day (BID) | CUTANEOUS | Status: DC | PRN

## 2022-02-17 NOTE — ED Notes (Signed)
3W not ready for reports, call back to [facility] to notify them ?

## 2022-02-17 NOTE — H&P (Addendum)
History and Physical    [name] KKX:[id number] DOB: 18-Apr-1956 DOA: 02/17/2022  PCP: [name], [name], MD Patient coming from: home  I have personally briefly reviewed patient's old medical records in [place]  Chief Complaint: right side facial numbness and right hand numbness for 62mns and slurred speech while at the restaurant  HPI: Veronica Vanburenis a 66y.o. female with medical history significant of rectal cancer s/p resection and XRT in 2005, sarcoidosis followed with Dr WMelvyn Novas stable ,not on any medicatin, h/o HTN, HLD was on statin in the past, h/o endometrial cancer s/p TAH BSO in 2019, with intrapelvic lymph nodes, getting chemo sicne 05/2020 to 06/2021 then started lenvatinib/pembrolizumab q21d since 08/2021, last  dose pembrolizumab on 2/21 came to the ED due to right side facial numbness and right hand numbness for 326ms and slurred speech while at the restaurant this am, resolved once got to the ED.  ED Course: Blood pressure (!) 186/80, pulse 72, temperature 97.7 F (36.5 C), temperature source Oral, resp. rate 15, last menstrual period 12/17/2003, SpO2 100 %.   Data reviewed: MRI brain: Subcentimeter acute infarct within the left corona radiata/caudate nucleus.  Additional punctate acute infarct within the mid left frontal lobe.  Mild chronic small vessel ischemic changes within the cerebral white matter and pons.  Paranasal sinus disease, as described.  Trace fluid within the right mastoid air cells.  UDS negative COVID screening negative Blood glucose 141 Otherwise CBC/CMP unremarkable   EKG: Independently reviewed. Sinus rhythm, chronic RBBB, no acute ST/T changes   Review of Systems: As per HPI otherwise all other systems reviewed and are negative.   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  ChemoXRT 2005 Dr PeCollier Salinaubin/[facility]   Upper airway cough syndrome    Wears glasses     Past Surgical History:  Procedure Laterality Date   BRONCHOSCOPY  01/22/2008   w/ Lavage and bronchial bx   CARDIOVASCULAR STRESS TEST  06/27/2006   normal nuclear study w/ no ischemia/  normal LV function and wall motion , ef 61%   CARPAL TUNNEL RELEASE Right 01/17/2016   and finger trigger release   CLEFT LIP REPAIR     CO2 LASER ABLATION VULVA     EUA/  EXCISIONAL MULTIPLE PERINEAL/ANAL BX'S/  SIGMOIDSCOPY/  PORT-A-CATH PLACEMENT  03/27/2004   Local excision - Dr TiLennie Hummer IR IMAGING GUIDED PORT INSERTION  05/26/2020   LAPAROSCOPIC CHOLECYSTECTOMY  08/14/2008   MULTIPLE ANAL BX'S  10/10/2006   RELEASE A1 PULLEY THUMB Bilateral left 12-17-2006/  right 09-05-2009  REMOVAL PORT-A-CATH/  EUA/  MULTIPLE ANAL BX'S  10/16/2004   ROBOTIC ASSISTED TOTAL HYSTERECTOMY WITH BILATERAL SALPINGO OOPHERECTOMY Bilateral 06/25/2018   Procedure: XI ROBOTIC ASSISTED TOTAL HYSTERECTOMY WITH BILATERAL SALPINGO OOPHORECTOMY;  Surgeon: Everitt Amber, MD;  Location: [facility];  Service: Gynecology;  Laterality: Bilateral;   SENTINEL NODE BIOPSY N/A 06/25/2018   Procedure: SENTINEL NODE BIOPSY;  Surgeon: Everitt Amber, MD;  Location: [facility];  Service: Gynecology;  Laterality: N/A;   TENNIS ELBOW RELEASE/NIRSCHEL PROCEDURE Right 11/28/2016    Procedure: RIGHT ELBOW ULNER NERVE RELEASE AND OR TRANSPOSITION;  Surgeon: Iran Planas, MD;  Location: [facility];  Service: Orthopedics;  Laterality: Right;    Social History  reports that she quit smoking about 28 years ago. Her smoking use included cigarettes. She has a 0.50 pack-year smoking history. She has never used smokeless tobacco. She reports current alcohol use. She reports that she does not use drugs.  Allergies  Allergen Reactions   Shellfish Allergy Hives, Itching and Swelling   Penicillins Hives and Itching    Has patient had a PCN reaction causing immediate rash, facial/tongue/throat swelling, SOB or lightheadedness with hypotension: Yes Has patient had a PCN reaction causing severe rash involving mucus membranes or skin necrosis: Yes Has patient had a PCN reaction that required hospitalization: Yes Has patient had a PCN reaction occurring within the last 10 years: No If all of the above answers are "NO", then may proceed with Cephalosporin use.     Family History  Problem Relation Age of Onset   Allergies Mother    Asthma Mother    Lung cancer Mother    Allergies Sister    Allergies Brother    Asthma Brother    Asthma Sister    Sarcoidosis Neg Hx    Colon cancer Neg Hx    Liver cancer Neg Hx    Stomach cancer Neg Hx    Esophageal cancer Neg Hx    Rectal cancer Neg Hx     Prior to Admission medications   Medication Sig Start Date End Date Taking? Authorizing Provider  acetaminophen (TYLENOL) 500 MG tablet Take 500 mg by mouth every 6 (six) hours as needed for mild pain.   Yes [provider]  albuterol (PROAIR HFA) 108 (90 Base) MCG/ACT inhaler 2 puffs every 4 hours as needed only  if your can't catch your breath Patient taking differently: Inhale 2 puffs into the lungs every 4 (four) hours as needed for wheezing or shortness of breath. 10/31/21  Yes Tanda Rockers, MD  bisoprolol (ZEBETA) 10 MG tablet Take 1 tablet (10 mg total)  by mouth in the morning and at bedtime. 12/12/21  Yes Gorsuch, Ni, MD  cetirizine (ALLERGY, CETIRIZINE,) 10 MG tablet Take 1 tablet (10 mg total) by mouth daily. 02/15/22 03/17/22 Yes Leath-Warren, Alda Lea, NP  desonide (DESOWEN) 0.05 % lotion Apply topically 2 (two) times daily. Continue following with dermatologist Patient taking differently: Apply 1 application topically 2 (two) times daily as needed (skin irritation). Continue following with dermatologist 09/29/19  Yes [name], [name], MD  diphenhydrAMINE (BENADRYL) 25 MG tablet Take 25 mg by mouth every 6 (six) hours as needed for allergies.   Yes [provider]  doxycycline (VIBRA-TABS) 100 MG tablet Take 1 tablet (100 mg total) by mouth 2 (two) times daily. 02/05/22  Yes Gorsuch, Ni, MD  famotidine (PEPCID) 20 MG tablet Take 1 tablet (20 mg total) by mouth daily for 10 days. 02/15/22 03/17/22 Yes Leath-Warren,  Alda Lea, NP  hydrochlorothiazide (HYDRODIURIL) 25 MG tablet Take 1 tablet (25 mg total) by mouth daily. 12/04/21  Yes [name], MD  lenvatinib 20 mg daily dose (LENVIMA) 2 x 10 MG capsule Take 1 capsule (10 mg total) by mouth daily. 08/22/21  Yes [name], MD  levothyroxine (SYNTHROID) 100 MCG tablet Take 1 tablet (100 mcg total) by mouth daily before breakfast. 12/21/21  Yes Gorsuch, Ni, MD  lidocaine-prilocaine (EMLA) cream Apply 1 application topically as needed. 09/25/21  Yes Gorsuch, Ni, MD  loperamide (IMODIUM) 2 MG capsule Take 2 mg by mouth daily as needed for diarrhea or loose stools.   Yes [provider]  loratadine (CLARITIN) 10 MG tablet Take 10 mg by mouth daily as needed for allergies.   Yes [provider]  mometasone-formoterol (DULERA) 100-5 MCG/ACT AERO Take 2 puffs first thing in am and then another 2 puffs about 12 hours later. Patient taking differently: 2 puffs 2 (two) times daily as needed for wheezing or shortness of breath. 10/31/21  Yes Tanda Rockers, MD  predniSONE (DELTASONE) 10 MG  tablet Take 6 tablets by mouth daily for 2 days, THEN 5 tablets daily for 2 days, THEN 4 tablets daily for 2 days, THEN 3 tablets daily for 2 days, THEN 2 tablets daily for 2 days, THEN 1 tablet daily for 2 days. 02/16/22 02/28/22 Yes Tacy Learn, PA-C  prochlorperazine (COMPAZINE) 10 MG tablet TAKE 1 TABLET BY MOUTH EVERY 6 HOURS AS NEEDED FOR NAUSEA OR VOMITING Patient taking differently: Take 10 mg by mouth every 6 (six) hours as needed for nausea or vomiting. 02/02/21 02/17/22 Yes Gorsuch, Ni, MD  triamcinolone (NASACORT) 55 MCG/ACT AERO nasal inhaler Place 2 sprays into each nostril daily. Patient taking differently: Place 2 sprays into the nose 2 (two) times daily as needed (congestion). 11/22/21  Yes Gorsuch, Ernst Spell, MD  vitamin C (ASCORBIC ACID) 500 MG tablet Take 500 mg by mouth daily.   Yes [provider]  ondansetron (ZOFRAN) 8 MG tablet Take 1 tablet (8 mg total) by mouth every 8 (eight) hours as needed for nausea. 05/19/20   [name], MD  predniSONE (STERAPRED UNI-PAK 21 TAB) 10 MG (21) TBPK tablet Take by mouth daily. Take 6 tabs by mouth daily  for 2 days, then 5 tabs for 2 days, then 4 tabs for 2 days, then 3 tabs for 2 days, 2 tabs for 2 days, then 1 tab by mouth daily for 2 days 02/16/22   Tacy Learn, PA-C    Physical Exam: Vitals:   02/17/22 1649 02/17/22 1700 02/17/22 1730 02/17/22 1745  BP: (!) 201/94 (!) 207/86 (!) 194/89 (!) 179/94  Pulse: 65 64 63 74  Resp: '20 14 17 17  '$ Temp:      TempSrc:      SpO2: 100% 99% 100% 100%    Constitutional: NAD, calm, comfortable Eyes: PERRL, lids and conjunctivae normal ENMT: Mucous membranes are moist.  Respiratory: clear to auscultation bilaterally, no wheezing, no crackles. Normal respiratory effort. No accessory muscle use.  Cardiovascular: Regular rate and rhythm,  No extremity edema. 2+ pedal pulses. No carotid bruits.  Abdomen: no tenderness, not distended, Bowel sounds positive.  Musculoskeletal: bilateral lower  extremity edema, left worse than right , which is chronic for the last two yrs, was told due to her endometrial cancer   Skin: facial rash Neurologic: CN 2-12 grossly intact. Sensation intact, Strength 5/5 in all 4.  Psychiatric: Normal judgment and insight.  Alert and oriented x 3. Normal mood.    Labs on Admission: I have personally reviewed following labs and imaging studies  CBC: Recent Labs  Lab 02/17/22 1445  WBC 7.0  NEUTROABS 5.4  HGB 12.0  HCT 34.7*  MCV 97.5  PLT 132    Basic Metabolic Panel: Recent Labs  Lab 02/17/22 1445  NA 137  K 3.5  CL 100  CO2 27  GLUCOSE 141*  BUN 18  CREATININE 0.89  CALCIUM 8.9    GFR: Estimated Creatinine Clearance: 72.1 mL/min (by C-G formula based on SCr of 0.89 mg/dL).  Liver Function Tests: Recent Labs  Lab 02/17/22 1445  AST 23  ALT 24  ALKPHOS 107  BILITOT 0.5  PROT 8.0  ALBUMIN 3.9    Urine analysis:    Component Value Date/Time   COLORURINE YELLOW 02/17/2022 [place] 02/17/2022 1545   LABSPEC 1.015 02/17/2022 1545   PHURINE 6.0 02/17/2022 1545   GLUCOSEU NEGATIVE 02/17/2022 1545   HGBUR NEGATIVE 02/17/2022 1545   BILIRUBINUR NEGATIVE 02/17/2022 1545   BILIRUBINUR n 06/27/2015 1107   KETONESUR NEGATIVE 02/17/2022 1545   PROTEINUR NEGATIVE 02/17/2022 1545   UROBILINOGEN 0.2 06/27/2015 1107   UROBILINOGEN 0.2 02/08/2014 0920   NITRITE NEGATIVE 02/17/2022 1545   LEUKOCYTESUR SMALL (A) 02/17/2022 1545    Radiological Exams on Admission: CT HEAD WO CONTRAST  Result Date: 02/17/2022 CLINICAL DATA:  Acute neurologic deficit. EXAM: CT HEAD WITHOUT CONTRAST TECHNIQUE: Contiguous axial images were obtained from the base of the skull through the vertex without intravenous contrast. RADIATION DOSE REDUCTION: This exam was performed according to the departmental dose-optimization program which includes automated exposure control, adjustment of the mA and/or kV according to patient size and/or use of  iterative reconstruction technique. COMPARISON:  04/14/2010 FINDINGS: Brain: No evidence of acute infarction, hemorrhage, hydrocephalus, extra-axial collection or mass lesion/mass effect. Vascular: No hyperdense vessel or unexpected calcification. Skull: Normal. Negative for fracture or focal lesion. Sinuses/Orbits: Mucosal thickening is noted involving the ethmoid air cells and left maxillary sinus. Other: None IMPRESSION: 1. No acute intracranial abnormalities. 2. Chronic sinus inflammation. Electronically Signed   By: Kerby Moors M.D.   On: 02/17/2022 14:57   MR BRAIN WO CONTRAST  Result Date: 02/17/2022 CLINICAL DATA:  Neuro deficit, acute, stroke suspected. Right-sided facial numbness and left-sided facial droop. EXAM: MRI HEAD WITHOUT CONTRAST TECHNIQUE: Multiplanar, multiecho pulse sequences of the brain and surrounding structures were obtained without intravenous contrast. COMPARISON:  Head CT 02/17/2022. FINDINGS: Brain: Cerebral volume appears normal for age. Subcentimeter acute infarct within the left corona radiata/caudate nucleus (series 6, image 29). Additional punctate acute infarct within the mid left frontal lobe (series 5, image 85). Mild multifocal T2 FLAIR hyperintense signal abnormality within the cerebral white matter and pons, nonspecific but compatible with chronic small vessel ischemic disease. No evidence of an intracranial mass. No chronic intracranial blood products. No extra-axial fluid collection. No midline shift. Vascular: Maintained flow voids within the proximal large arterial vessels. Skull and upper cervical spine: No focal suspicious marrow lesion. Sinuses/Orbits: Visualized orbits show no acute finding. Mild mucosal thickening within the left frontal sinus. Mild-to-moderate mucosal thickening and scattered fluid within the bilateral ethmoid air cells. Mucous retention cyst within a posterior right ethmoid air cell. Small volume frothy secretions within the left sphenoid  sinus. Mild-to-moderate mucosal thickening within the bilateral maxillary sinuses. Other: Trace fluid within the right mastoid air cells. 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 Additional  punctate acute infarct within the mid left frontal lobe. Mild chronic small vessel ischemic changes within the cerebral white matter and pons. Paranasal sinus disease, as described. Trace fluid within the right mastoid air cells. Electronically Signed   By: Kellie Simmering D.O.   On: 02/17/2022 17:10     Assessment/Plan Principal Problem:   Acute CVA (cerebrovascular accident) ([place])    Acute CVA -Present with right side numbness/slurred speech, symptom lasted for 72mns, appear has resolved  -Observation -To [place] for neurology consult/work up -Allow permissive hypertension -Check lipid panel, A1c -Echocardiogram -PT OT speech -Per neurology start aspirin 81 mg daily/Plavix 75 mg daily -will leave neurology and medical oncology to decide on lenvatinib management in the setting of acute CVA  H/o cleft lip s/p repair, reports chronic left nasolabial fold flattening  Facial rash , recently treated with doxy and steroid, much improved   HTN Allow permissive hypertension for first 24 hours post acute stroke, as needed hydralazine for SBP greater than 220 for now  HLD Report not taking statin anymore, denies statin intolerance  check lipid panel Likely will need to resume statin   endometrial cancer s/p TAH BSO in 2019, with malignant intrapelvic lymph nodes, getting chemo sicne 05/2020 to 06/2021 then started lenvatinib/pembrolizumab q21d since 08/2021, last  dose pembrolizumab on 2/21 -will leave neurology and medical oncology to decide on lenvatinib management in the setting of acute CVA  Chronic bilateral lower extremity edema, left greater than right Suspect related to endometrial cancer with lymph node involvement update venous Doppler Continue  HCTZ Elevate legs Compression stocking  H/o sarcoidosis followed with Dr WMelvyn Novas stable ,not on any medication   DVT prophylaxis:   Lovenox   Code Status:   Full Family Communication:  Patient  Patient is from: Home    Anticipated DC to: Home   Anticipated DC date: Possible tomorrow if cleared by neurology    Consults called:  neurology Admission status:  Observation  Severity of Illness:    Voice Recognition /Dragon dictation system was used to create this note, attempts have been made to correct errors. Please contact the author with questions and/or clarifications.  FFlorencia ReasonsMD PhD FACP [facility]  How to contact the [facility] Surgery[facility]ending or Consulting provider [place] covering provider during after hours 7[place] for this patient?   Check the care team in [facility] look for a) attending/consulting TR[facility] provider listed and b) the T[facility]team listed Log into [email or web address] and use [place]'s universal password to access. If you do not have the password, please contact the hospital operator. Locate the TP[facility] Care LLCprovider you are looking for under [facility] and page to a number that you can be directly reached. If you still have difficulty reaching the provider, please page the [facility](Director on Call) for the Hospitalists listed on amion for assistance.  02/17/2022, 6:01 PM

## 2022-02-17 NOTE — ED Notes (Signed)
Call to 3W to give report 

## 2022-02-17 NOTE — ED Notes (Signed)
Pt BP elevated.  EDP notified via DM ? ?

## 2022-02-17 NOTE — ED Notes (Signed)
Pt was seen by Dr. Erlinda Hong, she is now eating dinner tray.  No distress noted.  ?

## 2022-02-17 NOTE — ED Notes (Signed)
Patient transported to MRI 

## 2022-02-17 NOTE — ED Notes (Signed)
Report given to Levada Dy RN at [facility].  [facility] notified and they will be here for tansport in 36mn ?

## 2022-02-17 NOTE — ED Notes (Signed)
ED TO INPATIENT HANDOFF REPORT  ED Nurse Name and Phone #: Orpah Cobb Name/Age/Gender [name] 67 y.o. female Room/Bed: WA08/WA08  Code Status   Code Status: Prior  Home/SNF/Other Home Patient oriented to: self, place, time, and situation Is this baseline? Yes   Triage Complete: Triage complete  Chief Complaint Acute CVA (cerebrovascular accident) [facility]) [I63.9]  Triage Note Pt reports right sided facial numbness and left sided facial droop that started at 1300 today. Denies any other neurological symptoms.    Allergies Allergies  Allergen Reactions   Shellfish Allergy Hives, Itching and Swelling   Penicillins Hives and Itching    Has patient had a PCN reaction causing immediate rash, facial/tongue/throat swelling, SOB or lightheadedness with hypotension: Yes Has patient had a PCN reaction causing severe rash involving mucus membranes or skin necrosis: Yes Has patient had a PCN reaction that required hospitalization: Yes Has patient had a PCN reaction occurring within the last 10 years: No If all of the above answers are "NO", then may proceed with Cephalosporin use.     Level of Care/Admitting Diagnosis ED Disposition     ED Disposition  Admit   Condition  --   [place]: [place] [[id number]]  Level of Care: Telemetry Medical [104]  May place patient in observation at [facility] or [place] if equivalent level of care is available:: No  Covid Evaluation: Confirmed COVID Negative  Diagnosis: Acute CVA (cerebrovascular accident) [facility]) [[id number]]  Admitting Physician: Florencia Reasons [[id number]]  Attending Physician: Florencia Reasons [[id number]]          B Medical/Surgery History Past Medical History:  Diagnosis Date   Bifid uvula    Blood transfusion without reported diagnosis    Endometrial ca ([place]) dx'd 06/2018   chemo   GERD (gastroesophageal reflux disease)    History of cervical dysplasia    History of chemotherapy    06/ 2005    History of cleft lip    CORRECTED   History of condyloma acuminatum    PERIANAL   History of radiation therapy    completed 06/ 2005   History of vulvar dysplasia    Hypertension    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  ChemoXRT 2005 Dr Collier [place]ina Rubin/[facility]   Upper airway cough syndrome    Wears glasses    Past Surgical History:  Procedure Laterality Date   BRONCHOSCOPY  01/22/2008   w/ Lavage and bronchial bx   CARDIOVASCULAR STRESS TEST  06/27/2006   normal nuclear study w/ no ischemia/  normal LV function and wall motion , ef 61%   CARPAL TUNNEL RELEASE Right 01/17/2016   and finger trigger release   CLEFT LIP REPAIR     CO2 LASER ABLATION VULVA     EUA/  EXCISIONAL MULTIPLE PERINEAL/ANAL BX'S/  SIGMOIDSCOPY/  PORT-A-CATH PLACEMENT  03/27/2004   Local excision - Dr Lennie Hummer   IR IMAGING GUIDED PORT INSERTION  05/26/2020   LAPAROSCOPIC CHOLECYSTECTOMY  08/14/2008   MULTIPLE ANAL BX'S  10/10/2006   RELEASE A1 PULLEY THUMB Bilateral left 12-17-2006/  right 09-05-2009   REMOVAL PORT-A-CATH/  EUA/  MULTIPLE ANAL BX'S  10/16/2004   ROBOTIC ASSISTED TOTAL HYSTERECTOMY WITH BILATERAL SALPINGO OOPHERECTOMY Bilateral 06/25/2018   Procedure: XI ROBOTIC ASSISTED TOTAL HYSTERECTOMY WITH BILATERAL SALPINGO OOPHORECTOMY;  Surgeon: Everitt Amber, MD;  Location: [facility];  Service: Gynecology;  Laterality: Bilateral;   SENTINEL NODE BIOPSY N/A 06/25/2018   Procedure: SENTINEL NODE BIOPSY;  Surgeon: Everitt Amber, MD;  Location: [facility];  Service: Gynecology;  Laterality: N/A;   TENNIS ELBOW RELEASE/NIRSCHEL PROCEDURE Right 11/28/2016   Procedure: RIGHT ELBOW ULNER NERVE RELEASE AND OR  TRANSPOSITION;  Surgeon: Iran Planas, MD;  Location: [place];  Service: Orthopedics;  Laterality: Right;     A IV Location/Drains/Wounds Patient Lines/Drains/Airways Status     Active Line/Drains/Airways     Name Placement date Placement time Site Days   Implanted Port 05/26/20 Right Chest 05/26/20  1158  Chest  632   Peripheral IV 02/17/22 20 G 1" Anterior;Proximal;Right Forearm 02/17/22  1454  Forearm  less than 1   Incision (Closed) 11/28/16 Elbow Right 11/28/16  1645  -- 1907   Incision - 5 Ports Abdomen Right;Lateral Right;Medial Left;Medial Left;Lateral Left;Upper 06/25/18  1110  -- 1333            Intake/Output Last 24 hours No intake or output data in the 24 hours ending 02/17/22 1827  Labs/Imaging Results for orders placed or performed during the hospital encounter of 02/17/22 (from the past 48 hour(s))  Resp Panel by RT-PCR (Flu A&B, Covid) Nasopharyngeal Swab     Status: None   Collection Time: 02/17/22  2:31 PM   Specimen: Nasopharyngeal Swab; Nasopharyngeal(NP) swabs in vial transport medium  Result Value Ref Range   SARS Coronavirus 2 by RT PCR NEGATIVE NEGATIVE    Comment: (NOTE) SARS-CoV-2 target nucleic acids are NOT DETECTED.  The SARS-CoV-2 RNA is generally detectable in upper respiratory specimens during the acute phase of infection. The lowest concentration of SARS-CoV-2 viral copies this assay can detect is 138 copies/mL. A negative result does not preclude SARS-Cov-2 infection and should not be used as the sole basis for treatment or other patient management decisions. A negative result may occur with  improper specimen collection/handling, submission of specimen other than nasopharyngeal swab, presence of viral mutation(s) within the areas targeted by this assay, and inadequate number of viral copies(<138 copies/mL). A negative result must be combined with clinical observations, patient history, and epidemiological information.  The expected result is Negative.  Fact Sheet for Patients:  EntrepreneurPulse.com.au  Fact Sheet for Healthcare Providers:  IncredibleEmployment.be  This test is no t yet approved or cleared by the [place] FDA and  has been authorized for detection and/or diagnosis of SARS-CoV-2 by FDA under an Emergency Use Authorization (EUA). This EUA will remain  in effect (meaning this test can be used) for the duration of the COVID-19 declaration under Section 564(b)(1) of the Act, 21 U.S.C.section 360bbb-3(b)(1), unless the authorization is terminated  or revoked sooner.       Influenza A by PCR NEGATIVE NEGATIVE   Influenza B by PCR NEGATIVE NEGATIVE    Comment: (NOTE) The Xpert Xpress SARS-CoV-2/FLU/RSV plus assay is intended as an aid in the diagnosis of influenza from Nasopharyngeal swab specimens and should not be used as a sole basis for treatment. Nasal washings and aspirates are unacceptable for Xpert Xpress SARS-CoV-2/FLU/RSV testing.  Fact Sheet for Patients: EntrepreneurPulse.com.au  Fact Sheet for Healthcare Providers: IncredibleEmployment.be  This test is not yet approved or cleared by the [place]  FDA and has been authorized for detection and/or diagnosis of SARS-CoV-2 by FDA under an Emergency Use Authorization (EUA). This EUA will remain in effect (meaning this test can be used) for the duration of the COVID-19 declaration under Section 564(b)(1) of the Act, 21 U.S.C. section 360bbb-3(b)(1), unless the authorization is terminated or revoked.  Performed at [facility], [place] [address], [place], [place] [address]   Ethanol     Status: None   Collection Time: 02/17/22  2:45 PM  Result Value Ref Range   Alcohol, Ethyl (B) <10 <10 mg/dL    Comment: (NOTE) Lowest detectable limit for serum alcohol is 10 mg/dL.  For medical purposes only. Performed at [facility], [place] [address] [place], [place] [address]   Protime-INR     Status: None   Collection Time: 02/17/22  2:45 PM  Result Value Ref Range   Prothrombin Time 13.5 11.4 - 15.2 seconds   INR 1.0 0.8 - 1.2    Comment: (NOTE) INR goal varies based on device and disease states. Performed at [facility], [place] [address], [place], [place] [address]   APTT     Status: None   Collection Time: 02/17/22  2:45 PM  Result Value Ref Range   aPTT 27 24 - 36 seconds    Comment: Performed at [facility], [place] [address], [place], [place] [address]  CBC     Status: Abnormal   Collection Time: 02/17/22  2:45 PM  Result Value Ref Range   WBC 7.0 4.0 - 10.5 K/uL   RBC 3.56 (L) 3.87 - 5.11 MIL/uL   Hemoglobin 12.0 12.0 - 15.0 g/dL   HCT 34.7 (L) 36.0 - 46.0 %   MCV 97.5 80.0 - 100.0 fL   MCH 33.7 26.0 - 34.0 pg   MCHC 34.6 30.0 - 36.0 g/dL   RDW 15.8 (H) 11.5 - 15.5 %   Platelets 214 150 - 400 K/uL   nRBC 0.0 0.0 - 0.2 %    Comment: Performed at [facility], [place] [address] [place], [place] [address]  Differential     Status: None   Collection Time: 02/17/22  2:45 PM  Result Value Ref Range   Neutrophils Relative % 78 %   Neutro Abs 5.4 1.7 - 7.7 K/uL   Lymphocytes Relative 15 %   Lymphs Abs 1.1 0.7 - 4.0 K/uL   Monocytes Relative 7 %   Monocytes Absolute 0.5 0.1 - 1.0 K/uL   Eosinophils Relative 0 %   Eosinophils Absolute 0.0 0.0 - 0.5 K/uL   Basophils Relative 0 %   Basophils Absolute 0.0 0.0 - 0.1 K/uL   Immature Granulocytes 0 %   Abs Immature Granulocytes 0.02 0.00 - 0.07 K/uL    Comment: Performed at [facility], [place] [address], [place], [place] [address]  Comprehensive metabolic panel     Status: Abnormal   Collection Time: 02/17/22  2:45 PM  Result Value Ref Range   Sodium 137 135 - 145 mmol/L   Potassium 3.5 3.5 - 5.1 mmol/L   Chloride 100 98 - 111 mmol/L   CO2 27 22 - 32 mmol/L    Glucose, Bld 141 (H) 70 - 99 mg/dL    Comment: Glucose reference range applies only to samples taken after fasting for at least 8 hours.   BUN 18 8 - 23 mg/dL   Creatinine, Ser 0.89 0.44 - 1.00 mg/dL   Calcium  8.9 8.9 - 10.3 mg/dL   Total Protein 8.0 6.5 - 8.1 g/dL   Albumin 3.9 3.5 - 5.0 g/dL   AST 23 15 - 41 U/L   ALT 24 0 - 44 U/L   Alkaline Phosphatase 107 38 - 126 U/L   Total Bilirubin 0.5 0.3 - 1.2 mg/dL   GFR, Estimated >60 >60 mL/min    Comment: (NOTE) Calculated using the CKD-EPI Creatinine Equation (2021)    Anion gap 10 5 - 15    Comment: Performed at [facility], [place] [address], [place], [place] [address]  Urine rapid drug screen (hosp performed)     Status: None   Collection Time: 02/17/22  3:45 PM  Result Value Ref Range   Opiates NONE DETECTED NONE DETECTED   Cocaine NONE DETECTED NONE DETECTED   Benzodiazepines NONE DETECTED NONE DETECTED   Amphetamines NONE DETECTED NONE DETECTED   Tetrahydrocannabinol NONE DETECTED NONE DETECTED   Barbiturates NONE DETECTED NONE DETECTED    Comment: (NOTE) DRUG SCREEN FOR MEDICAL PURPOSES ONLY.  IF CONFIRMATION IS NEEDED FOR ANY PURPOSE, NOTIFY LAB WITHIN 5 DAYS.  LOWEST DETECTABLE LIMITS FOR URINE DRUG SCREEN Drug Class                     Cutoff (ng/mL) Amphetamine and metabolites    1000 Barbiturate and metabolites    200 Benzodiazepine                 546 Tricyclics and metabolites     300 Opiates and metabolites        300 Cocaine and metabolites        300 THC                            50 Performed at [facility], [place] [address], [place], [place] [address]   Urinalysis, Routine w reflex microscopic     Status: Abnormal   Collection Time: 02/17/22  3:45 PM  Result Value Ref Range   Color, Urine YELLOW YELLOW   APPearance CLEAR CLEAR   Specific Gravity, Urine 1.015 1.005 - 1.030   pH 6.0 5.0 - 8.0   Glucose, UA NEGATIVE NEGATIVE mg/dL   Hgb urine dipstick NEGATIVE  NEGATIVE   Bilirubin Urine NEGATIVE NEGATIVE   Ketones, ur NEGATIVE NEGATIVE mg/dL   Protein, ur NEGATIVE NEGATIVE mg/dL   Nitrite NEGATIVE NEGATIVE   Leukocytes,Ua SMALL (A) NEGATIVE   RBC / HPF 0-5 0 - 5 RBC/hpf   WBC, UA 11-20 0 - 5 WBC/hpf   Bacteria, UA RARE (A) NONE SEEN   Squamous Epithelial / LPF 0-5 0 - 5   Mucus PRESENT     Comment: Performed at [facility], [place] [address] [place], [place] [address]   CT HEAD WO CONTRAST  Result Date: 02/17/2022 CLINICAL DATA:  Acute neurologic deficit. EXAM: CT HEAD WITHOUT CONTRAST TECHNIQUE: Contiguous axial images were obtained from the base of the skull through the vertex without intravenous contrast. RADIATION DOSE REDUCTION: This exam was performed according to the departmental dose-optimization program which includes automated exposure control, adjustment of the mA and/or kV according to patient size and/or use of iterative reconstruction technique. COMPARISON:  04/14/2010 FINDINGS: Brain: No evidence of acute infarction, hemorrhage, hydrocephalus, extra-axial collection or mass lesion/mass effect. Vascular: No hyperdense vessel or unexpected calcification. Skull: Normal. Negative for fracture or focal lesion. Sinuses/Orbits: Mucosal thickening is noted involving the  ethmoid air cells and left maxillary sinus. Other: None IMPRESSION: 1. No acute intracranial abnormalities. 2. Chronic sinus inflammation. Electronically Signed   By: Kerby Moors M.D.   On: 02/17/2022 14:57   MR BRAIN WO CONTRAST  Result Date: 02/17/2022 CLINICAL DATA:  Neuro deficit, acute, stroke suspected. Right-sided facial numbness and left-sided facial droop. EXAM: MRI HEAD WITHOUT CONTRAST TECHNIQUE: Multiplanar, multiecho pulse sequences of the brain and surrounding structures were obtained without intravenous contrast. COMPARISON:  Head CT 02/17/2022. FINDINGS: Brain: Cerebral volume appears normal for age. Subcentimeter acute infarct within the left  corona radiata/caudate nucleus (series 6, image 29). Additional punctate acute infarct within the mid left frontal lobe (series 5, image 85). Mild multifocal T2 FLAIR hyperintense signal abnormality within the cerebral white matter and pons, nonspecific but compatible with chronic small vessel ischemic disease. No evidence of an intracranial mass. No chronic intracranial blood products. No extra-axial fluid collection. No midline shift. Vascular: Maintained flow voids within the proximal large arterial vessels. Skull and upper cervical spine: No focal suspicious marrow lesion. Sinuses/Orbits: Visualized orbits show no acute finding. Mild mucosal thickening within the left frontal sinus. Mild-to-moderate mucosal thickening and scattered fluid within the bilateral ethmoid air cells. Mucous retention cyst within a posterior right ethmoid air cell. Small volume frothy secretions within the left sphenoid sinus. Mild-to-moderate mucosal thickening within the bilateral maxillary sinuses. Other: Trace fluid within the right mastoid air cells. 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 Additional punctate acute infarct within the mid left frontal lobe. Mild chronic small vessel ischemic changes within the cerebral white matter and pons. Paranasal sinus disease, as described. Trace fluid within the right mastoid air cells. Electronically Signed   By: Kellie Simmering D.O.   On: 02/17/2022 17:10    Pending Labs Unresulted Labs (From admission, onward)     Start     Ordered   02/18/22 0500  Lipid panel  Tomorrow morning,   R        02/17/22 1813            Vitals/Pain Today's Vitals   02/17/22 1700 02/17/22 1730 02/17/22 1745 02/17/22 1800  BP: (!) 207/86 (!) 194/89 (!) 179/94 (!) 185/97  Pulse: 64 63 74 75  Resp: '14 17 17 15  '$ Temp:      TempSrc:      SpO2: 99% 100% 100%   PainSc:        Isolation Precautions No active isolations  Medications Medications   hydrochlorothiazide (HYDRODIURIL) tablet 25 mg (25 mg Oral Given 02/17/22 1738)  aspirin chewable tablet 81 mg (has no administration in time range)  clopidogrel (PLAVIX) tablet 75 mg (has no administration in time range)  labetalol (NORMODYNE) injection 10 mg (10 mg Intravenous Given 02/17/22 1739)    Mobility walks Moderate fall risk   Focused Assessments Neuro Assessment Handoff:  Swallow screen pass? Yes    NIH Stroke Scale ( + Modified Stroke Scale Criteria)  LOC Questions (1b. )   +: Answers both questions correctly LOC Commands (1c. )   + : Performs both tasks correctly Best Gaze (2. )  +: Normal Visual (3. )  +: No visual loss Motor Arm, Left (5a. )   +: No drift Motor Arm, Right (5b. )   +: No drift (minimally/mildly noticeable reduced hand grip on right) Motor Leg, Left (6a. )   +: No drift Motor Leg, Right (6b. )   +: No drift Sensory (8. )   +:  Normal, no sensory loss Best Language (9. )   +: No aphasia Extinction/Inattention (11.)   +: No Abnormality Modified SS Total  +: 0 Last date known well: 02/17/22 Last time known well: 1300 Neuro Assessment:   Neuro Checks:      Last Documented NIHSS Modified Score: 0 (02/17/22 1600) Has TPA been given? No If patient is a Neuro Trauma and patient is going to OR before floor call report to [place] nurse: [phone] or [phone]   R Recommendations: See Admitting Provider Note  Report given to:   Additional Notes:  Pt is alert and oriented and ambulatory to bathroom

## 2022-02-17 NOTE — ED Triage Notes (Signed)
Pt reports right sided facial numbness and left sided facial droop that started at 1300 today. Denies any other neurological symptoms.  ?

## 2022-02-17 NOTE — ED Provider Notes (Signed)
[facility] DEPT Provider Note   CSN: [id number] Arrival date & time: 02/17/22  1328     History  Chief Complaint  Patient presents with   Aphasia    Slurred speech  Patient evaluated by me at 2:15 PM  [name] is a 66 y.o. female.  HPI She presents for evaluation of numbness in right hand and right face and slurred speech, which occurred, while she was eating lunch today.  Both of these symptoms have resolved at the time she was evaluated by me.  Patient is receiving ongoing treatment for cancer, was seen yesterday in the ED for enlarged rash and felt to be secondary to chemotherapy.  She reports improvement of the rash of her face since starting prednisone.  Patient's sister was talked to the patient on FaceTime at the time I saw the patient and sister states that the patient did have dysarthria earlier but no dysarthria at 2:15 PM.  Her symptoms have improved.  No prior similar problems or history of stroke.  Per sister patient has ongoing disability of the left hip and left leg, and walks with a "shuffling."    Home Medications Prior to Admission medications   Medication Sig Start Date End Date Taking? Authorizing Provider  acetaminophen (TYLENOL) 500 MG tablet Take 500 mg by mouth every 6 (six) hours as needed for mild pain.   Yes [provider]  albuterol (PROAIR HFA) 108 (90 Base) MCG/ACT inhaler 2 puffs every 4 hours as needed only  if your can't catch your breath Patient taking differently: Inhale 2 puffs into the lungs every 4 (four) hours as needed for wheezing or shortness of breath. 10/31/21  Yes Tanda Rockers, MD  bisoprolol (ZEBETA) 10 MG tablet Take 1 tablet (10 mg total) by mouth in the morning and at bedtime. 12/12/21  Yes Gorsuch, Ni, MD  cetirizine (ALLERGY, CETIRIZINE,) 10 MG tablet Take 1 tablet (10 mg total) by mouth daily. 02/15/22 03/17/22 Yes Leath-Warren, Alda Lea, NP  desonide (DESOWEN) 0.05 % lotion Apply topically  2 (two) times daily. Continue following with dermatologist Patient taking differently: Apply 1 application topically 2 (two) times daily as needed (skin irritation). Continue following with dermatologist 09/29/19  Yes [name], [name], MD  diphenhydrAMINE (BENADRYL) 25 MG tablet Take 25 mg by mouth every 6 (six) hours as needed for allergies.   Yes [provider]  doxycycline (VIBRA-TABS) 100 MG tablet Take 1 tablet (100 mg total) by mouth 2 (two) times daily. 02/05/22  Yes Gorsuch, Ni, MD  famotidine (PEPCID) 20 MG tablet Take 1 tablet (20 mg total) by mouth daily for 10 days. 02/15/22 03/17/22 Yes Leath-Warren, Alda Lea, NP  hydrochlorothiazide (HYDRODIURIL) 25 MG tablet Take 1 tablet (25 mg total) by mouth daily. 12/04/21  Yes [name], MD  lenvatinib 20 mg daily dose (LENVIMA) 2 x 10 MG capsule Take 1 capsule (10 mg total) by mouth daily. 08/22/21  Yes [name], MD  levothyroxine (SYNTHROID) 100 MCG tablet Take 1 tablet (100 mcg total) by mouth daily before breakfast. 12/21/21  Yes Gorsuch, Ni, MD  lidocaine-prilocaine (EMLA) cream Apply 1 application topically as needed. 09/25/21  Yes Gorsuch, Ni, MD  loperamide (IMODIUM) 2 MG capsule Take 2 mg by mouth daily as needed for diarrhea or loose stools.   Yes [provider]  loratadine (CLARITIN) 10 MG tablet Take 10 mg by mouth daily as needed for allergies.   Yes [provider]  mometasone-formoterol (DULERA) 100-5 MCG/ACT  AERO Take 2 puffs first thing in am and then another 2 puffs about 12 hours later. Patient taking differently: 2 puffs 2 (two) times daily as needed for wheezing or shortness of breath. 10/31/21  Yes Tanda Rockers, MD  predniSONE (DELTASONE) 10 MG tablet Take 6 tablets by mouth daily for 2 days, THEN 5 tablets daily for 2 days, THEN 4 tablets daily for 2 days, THEN 3 tablets daily for 2 days, THEN 2 tablets daily for 2 days, THEN 1 tablet daily for 2 days. 02/16/22 02/28/22 Yes Tacy Learn, PA-C   prochlorperazine (COMPAZINE) 10 MG tablet TAKE 1 TABLET BY MOUTH EVERY 6 HOURS AS NEEDED FOR NAUSEA OR VOMITING Patient taking differently: Take 10 mg by mouth every 6 (six) hours as needed for nausea or vomiting. 02/02/21 02/17/22 Yes Gorsuch, Ni, MD  triamcinolone (NASACORT) 55 MCG/ACT AERO nasal inhaler Place 2 sprays into each nostril daily. Patient taking differently: Place 2 sprays into the nose 2 (two) times daily as needed (congestion). 11/22/21  Yes Gorsuch, Ernst Spell, MD  vitamin C (ASCORBIC ACID) 500 MG tablet Take 500 mg by mouth daily.   Yes [provider]  ondansetron (ZOFRAN) 8 MG tablet Take 1 tablet (8 mg total) by mouth every 8 (eight) hours as needed for nausea. 05/19/20   [name], MD  predniSONE (STERAPRED UNI-PAK 21 TAB) 10 MG (21) TBPK tablet Take by mouth daily. Take 6 tabs by mouth daily  for 2 days, then 5 tabs for 2 days, then 4 tabs for 2 days, then 3 tabs for 2 days, 2 tabs for 2 days, then 1 tab by mouth daily for 2 days 02/16/22   Tacy Learn, PA-C      Allergies    Shellfish allergy and Penicillins    Review of Systems   Review of Systems  Physical Exam Updated Vital Signs BP (!) 194/89    Pulse 63    Temp 97.7 F (36.5 C) (Oral)    Resp 17    LMP 12/17/2003    SpO2 100%  Physical Exam Vitals and nursing note reviewed.  Constitutional:      General: She is not in acute distress.    Appearance: She is well-developed. She is not ill-appearing or diaphoretic.  HENT:     Head: Normocephalic and atraumatic.     Right Ear: External ear normal.     Left Ear: External ear normal.     Nose: No congestion or rhinorrhea.  Eyes:     Conjunctiva/sclera: Conjunctivae normal.     Pupils: Pupils are equal, round, and reactive to light.  Neck:     Trachea: Phonation normal.  Cardiovascular:     Rate and Rhythm: Normal rate and regular rhythm.     Heart sounds: Normal heart sounds.  Pulmonary:     Effort: Pulmonary effort is normal.     Breath sounds: Normal  breath sounds.  Abdominal:     Palpations: Abdomen is soft.     Tenderness: There is no abdominal tenderness.  Musculoskeletal:        General: Normal range of motion.     Cervical back: Normal range of motion and neck supple.     Comments: Weakness left leg which is apparently chronic and associated with prior diagnosis of cancer.  Skin:    General: Skin is warm and dry.     Comments: Skin of face somewhat thickened and slightly red, improved compared with rash yesterday, based on imaging visualized  in the chart.  Neurological:     Mental Status: She is alert and oriented to person, place, and time.     Cranial Nerves: No cranial nerve deficit.     Sensory: No sensory deficit.     Motor: No abnormal muscle tone.     Coordination: Coordination normal.     Comments: No dysarthria, aphasia or nystagmus.  No pronator drift.  Psychiatric:        Mood and Affect: Mood normal.        Behavior: Behavior normal.        Thought Content: Thought content normal.        Judgment: Judgment normal.    ED Results / Procedures / Treatments   Labs (all labs ordered are listed, but only abnormal results are displayed) Labs Reviewed  CBC - Abnormal; Notable for the following components:      Result Value   RBC 3.56 (*)    HCT 34.7 (*)    RDW 15.8 (*)    All other components within normal limits  COMPREHENSIVE METABOLIC PANEL - Abnormal; Notable for the following components:   Glucose, Bld 141 (*)    All other components within normal limits  URINALYSIS, ROUTINE W REFLEX MICROSCOPIC - Abnormal; Notable for the following components:   Leukocytes,Ua SMALL (*)    Bacteria, UA RARE (*)    All other components within normal limits  RESP PANEL BY RT-PCR (FLU A&B, COVID) ARPGX2  ETHANOL  PROTIME-INR  APTT  DIFFERENTIAL  RAPID URINE DRUG SCREEN, HOSP PERFORMED    EKG EKG Interpretation  Date/Time:  Sunday February 17 2022 15:24:17 EST Ventricular Rate:  66 PR Interval:  165 QRS  Duration: 137 QT Interval:  436 QTC Calculation: 457 R Axis:   49 Text Interpretation: Sinus rhythm Right bundle branch block since last tracing no significant change Confirmed by Daleen Bo [id number]) on 02/17/2022 3:49:18 PM  Radiology CT HEAD WO CONTRAST  Result Date: 02/17/2022 CLINICAL DATA:  Acute neurologic deficit. EXAM: CT HEAD WITHOUT CONTRAST TECHNIQUE: Contiguous axial images were obtained from the base of the skull through the vertex without intravenous contrast. RADIATION DOSE REDUCTION: This exam was performed according to the departmental dose-optimization program which includes automated exposure control, adjustment of the mA and/or kV according to patient size and/or use of iterative reconstruction technique. COMPARISON:  04/14/2010 FINDINGS: Brain: No evidence of acute infarction, hemorrhage, hydrocephalus, extra-axial collection or mass lesion/mass effect. Vascular: No hyperdense vessel or unexpected calcification. Skull: Normal. Negative for fracture or focal lesion. Sinuses/Orbits: Mucosal thickening is noted involving the ethmoid air cells and left maxillary sinus. Other: None IMPRESSION: 1. No acute intracranial abnormalities. 2. Chronic sinus inflammation. Electronically Signed   By: Kerby Moors M.D.   On: 02/17/2022 14:57   MR BRAIN WO CONTRAST  Result Date: 02/17/2022 CLINICAL DATA:  Neuro deficit, acute, stroke suspected. Right-sided facial numbness and left-sided facial droop. EXAM: MRI HEAD WITHOUT CONTRAST TECHNIQUE: Multiplanar, multiecho pulse sequences of the brain and surrounding structures were obtained without intravenous contrast. COMPARISON:  Head CT 02/17/2022. FINDINGS: Brain: Cerebral volume appears normal for age. Subcentimeter acute infarct within the left corona radiata/caudate nucleus (series 6, image 29). Additional punctate acute infarct within the mid left frontal lobe (series 5, image 85). Mild multifocal T2 FLAIR hyperintense signal abnormality within  the cerebral white matter and pons, nonspecific but compatible with chronic small vessel ischemic disease. No evidence of an intracranial mass. No chronic intracranial blood products. No extra-axial fluid collection. No  midline shift. Vascular: Maintained flow voids within the proximal large arterial vessels. Skull and upper cervical spine: No focal suspicious marrow lesion. Sinuses/Orbits: Visualized orbits show no acute finding. Mild mucosal thickening within the left frontal sinus. Mild-to-moderate mucosal thickening and scattered fluid within the bilateral ethmoid air cells. Mucous retention cyst within a posterior right ethmoid air cell. Small volume frothy secretions within the left sphenoid sinus. Mild-to-moderate mucosal thickening within the bilateral maxillary sinuses. Other: Trace fluid within the right mastoid air cells. 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 Additional punctate acute infarct within the mid left frontal lobe. Mild chronic small vessel ischemic changes within the cerebral white matter and pons. Paranasal sinus disease, as described. Trace fluid within the right mastoid air cells. Electronically Signed   By: Kellie Simmering D.O.   On: 02/17/2022 17:10    Procedures Procedures    Medications Ordered in ED Medications  bisoprolol (ZEBETA) tablet 10 mg (has no administration in time range)  hydrochlorothiazide (HYDRODIURIL) tablet 25 mg (25 mg Oral Given 02/17/22 1738)  labetalol (NORMODYNE) injection 10 mg (10 mg Intravenous Given 02/17/22 1739)    ED Course/ Medical Decision Making/ A&P Clinical Course as of 02/17/22 1741  Sun Feb 17, 2022  1453 I discussed case with Dr. Quinn Axe, neuro hospitalist on-call.  She is agreeable to have patient followed as a TIA, after she is admitted at [facility] later. [EW]    Clinical Course User Index [EW] Daleen Bo, MD                           Medical Decision Making Transient speech  abnormality with right face and arm numbness consistent with left brain neurologic event, TIA.  Patient remained stable in the ED, CT negative, MRI positive for left brain stroke, x2.  Problems Addressed: Cerebrovascular accident (CVA), unspecified mechanism ([place]): acute illness or injury with systemic symptoms that poses a threat to life or bodily functions    Details: Initial symptoms improved spontaneously, not candidate for intervention with thrombolytics. Hypertension, unspecified type: chronic illness or injury    Details: Accelerated hypertension, treated with IV medications.  Amount and/or Complexity of Data Reviewed Independent Historian:     Details: Cogent historian, additional confirmatory information from sister by video telephone conversation Labs: ordered.    Details: CBC, metabolic panel, alcohol level, PT, PTT, viral panel, drug screen-normal except glucose high, small mild leukocytes in urine Radiology: ordered.    Details: CT head, no acute abnormalities.  MRI brain, acute strokes left corona radiata/caudate nucleus and left frontal lobe ECG/medicine tests: ordered and independent interpretation performed.    Details: Cardiac monitor-normal sinus rhythm Discussion of management or test interpretation with external provider(s): Case discussed with neuro hospitalist who request transfer to [facility] for hospitalization and consultation.  Case discussed with hospitalist for admission.  Risk Prescription drug management. Decision regarding hospitalization. Risk Details: Patient presenting with acute neurologic symptoms consistent with left brain stroke, symptoms improved spontaneously within an hour.  CT negative, MRI brain shows acute stroke left brain.  Cardiac monitor shows sinus rhythm.  No clear source of embolus.  Mild hypertension, unlikely to be cause of stroke.  She requires hospitalization for further management and evaluation for cause and ongoing treatment with  assistance of neurology.  Critical Care Total time providing critical care: 30-74 minutes          Final Clinical Impression(s) / ED Diagnoses Final diagnoses:  Cerebrovascular  accident (CVA), unspecified mechanism ([place])  Hypertension, unspecified type  Rash    Rx / DC Orders ED Discharge Orders     None         Daleen Bo, MD 02/17/22 [phone]

## 2022-02-17 NOTE — ED Notes (Signed)
Blood drawn from IV and tubes sent to lab. JRPRN ?

## 2022-02-17 NOTE — Progress Notes (Signed)
2045: Patient admitted to floor from [facility]. NAD and VSS. Oriented. Slight weakness to right grip but no other neuro deficits noted. Patient denies HA and vision changes. Already passed swallow eval.  ?

## 2022-02-17 NOTE — ED Notes (Signed)
Admitting MD at the bedside.  Pt is in no distress at this time ?

## 2022-02-17 NOTE — ED Notes (Signed)
Pt is back from MRI, alert and oriented without neuro deficit ?

## 2022-02-17 NOTE — Progress Notes (Incomplete)
Spoke w/ on-call Neurology provider and advised patient is on floor.

## 2022-02-17 NOTE — Progress Notes (Signed)
Lenvatinib (Lenvima) Hold Criteria: ?Grade 3 or 4 renal failure or impairment ?Grade 3 or 4 hepatotoxicity or hepatic failure ?Persistent and intolerable grade 2 or 3 adverse reactions ?Grade 4 laboratory abnormalities ?Arterial thrombotic event ?Grade 3 or 4 cardiac dysfunction ?Grade 3 hypertension, persisting despite optimal medical management ?Grade ? 3 QT prolongation ?GI perforation or life-threatening fistula ?Grade ? 3 hemorrhage ?Nephrotic syndrome ?? 2 g proteinuria/24 hours ?Reversible posterior leukoencephalopathy syndrome ?Active infection ? ?Lenvatinib placed on hold per [facility] chemotherapy policy. ? ? ?Peggyann Juba, PharmD, BCPS ?Pharmacy: [phone] ?02/17/2022 8:17 PM ? ?

## 2022-02-17 NOTE — ED Notes (Signed)
Pt notified that urine sample is needed 

## 2022-02-17 NOTE — ED Notes (Signed)
Call to [facility] for transport to [facility] ?

## 2022-02-17 NOTE — ED Notes (Signed)
MD Steinl aware notified of patients symptoms.  ?

## 2022-02-18 ENCOUNTER — Observation Stay (HOSPITAL_COMMUNITY): Payer: Medicare HMO

## 2022-02-18 ENCOUNTER — Other Ambulatory Visit (HOSPITAL_COMMUNITY): Payer: Self-pay

## 2022-02-18 ENCOUNTER — Other Ambulatory Visit: Payer: Self-pay | Admitting: Neurology

## 2022-02-18 ENCOUNTER — Observation Stay (HOSPITAL_BASED_OUTPATIENT_CLINIC_OR_DEPARTMENT_OTHER): Payer: Medicare HMO

## 2022-02-18 DIAGNOSIS — I6389 Other cerebral infarction: Secondary | ICD-10-CM | POA: Diagnosis not present

## 2022-02-18 DIAGNOSIS — C541 Malignant neoplasm of endometrium: Secondary | ICD-10-CM

## 2022-02-18 DIAGNOSIS — I10 Essential (primary) hypertension: Secondary | ICD-10-CM | POA: Diagnosis not present

## 2022-02-18 DIAGNOSIS — I639 Cerebral infarction, unspecified: Secondary | ICD-10-CM

## 2022-02-18 DIAGNOSIS — I63233 Cerebral infarction due to unspecified occlusion or stenosis of bilateral carotid arteries: Secondary | ICD-10-CM | POA: Diagnosis not present

## 2022-02-18 DIAGNOSIS — E785 Hyperlipidemia, unspecified: Secondary | ICD-10-CM

## 2022-02-18 DIAGNOSIS — I6503 Occlusion and stenosis of bilateral vertebral arteries: Secondary | ICD-10-CM | POA: Diagnosis not present

## 2022-02-18 DIAGNOSIS — I672 Cerebral atherosclerosis: Secondary | ICD-10-CM | POA: Diagnosis not present

## 2022-02-18 DIAGNOSIS — I63213 Cerebral infarction due to unspecified occlusion or stenosis of bilateral vertebral arteries: Secondary | ICD-10-CM | POA: Diagnosis not present

## 2022-02-18 LAB — COMPREHENSIVE METABOLIC PANEL
ALT: 22 U/L (ref 0–44)
AST: 20 U/L (ref 15–41)
Albumin: 3.3 g/dL — ABNORMAL LOW (ref 3.5–5.0)
Alkaline Phosphatase: 90 U/L (ref 38–126)
Anion gap: 11 (ref 5–15)
BUN: 19 mg/dL (ref 8–23)
CO2: 25 mmol/L (ref 22–32)
Calcium: 8.9 mg/dL (ref 8.9–10.3)
Chloride: 102 mmol/L (ref 98–111)
Creatinine, Ser: 1.06 mg/dL — ABNORMAL HIGH (ref 0.44–1.00)
GFR, Estimated: 59 mL/min — ABNORMAL LOW (ref >60)
Glucose, Bld: 153 mg/dL — ABNORMAL HIGH (ref 70–99)
Potassium: 3.7 mmol/L (ref 3.5–5.1)
Sodium: 138 mmol/L (ref 135–145)
Total Bilirubin: 0.4 mg/dL (ref 0.3–1.2)
Total Protein: 6.9 g/dL (ref 6.5–8.1)

## 2022-02-18 LAB — CBC
HCT: 31.7 % — ABNORMAL LOW (ref 36.0–46.0)
Hemoglobin: 11.3 g/dL — ABNORMAL LOW (ref 12.0–15.0)
MCH: 34.3 pg — ABNORMAL HIGH (ref 26.0–34.0)
MCHC: 35.6 g/dL (ref 30.0–36.0)
MCV: 96.4 fL (ref 80.0–100.0)
Platelets: 188 10*3/uL (ref 150–400)
RBC: 3.29 MIL/uL — ABNORMAL LOW (ref 3.87–5.11)
RDW: 15.8 % — ABNORMAL HIGH (ref 11.5–15.5)
WBC: 7.4 10*3/uL (ref 4.0–10.5)
nRBC: 0 % (ref 0.0–0.2)

## 2022-02-18 LAB — LIPID PANEL
Cholesterol: 273 mg/dL — ABNORMAL HIGH (ref 0–200)
HDL: 32 mg/dL — ABNORMAL LOW (ref >40)
LDL Cholesterol: 185 mg/dL — ABNORMAL HIGH (ref 0–99)
Total CHOL/HDL Ratio: 8.5 RATIO
Triglycerides: 282 mg/dL — ABNORMAL HIGH (ref <150)
VLDL: 56 mg/dL — ABNORMAL HIGH (ref 0–40)

## 2022-02-18 LAB — ECHOCARDIOGRAM COMPLETE BUBBLE STUDY
AR max vel: 1.74 cm2
AV Area VTI: 1.63 cm2
AV Area mean vel: 1.62 cm2
AV Mean grad: 6 mmHg
AV Peak grad: 12.4 mmHg
Ao pk vel: 1.76 m/s
Area-P 1/2: 3.2 cm2
Calc EF: 73 %
S' Lateral: 1.8 cm
Single Plane A2C EF: 72.8 %
Single Plane A4C EF: 73.5 %

## 2022-02-18 LAB — HEMOGLOBIN A1C
Hgb A1c MFr Bld: 4.8 % (ref 4.8–5.6)
Mean Plasma Glucose: 91.06 mg/dL

## 2022-02-18 LAB — HIV ANTIBODY (ROUTINE TESTING W REFLEX): HIV Screen 4th Generation wRfx: NONREACTIVE

## 2022-02-18 MED ORDER — IOHEXOL 350 MG/ML SOLN
75.0000 mL | Freq: Once | INTRAVENOUS | Status: AC | PRN
  Administered 2022-02-18: 75 mL via INTRAVENOUS

## 2022-02-18 MED ORDER — CLOPIDOGREL BISULFATE 75 MG PO TABS: 75.0000 mg | ORAL_TABLET | Freq: Every day | ORAL | 0 refills | Status: DC

## 2022-02-18 MED ORDER — ATORVASTATIN CALCIUM 80 MG PO TABS: 80.0000 mg | ORAL_TABLET | Freq: Every day | ORAL | 2 refills | Status: DC

## 2022-02-18 MED ORDER — ATORVASTATIN CALCIUM 80 MG PO TABS
80.0000 mg | ORAL_TABLET | Freq: Every day | ORAL | Status: DC
Start: 2022-02-18 — End: 2022-02-18
  Administered 2022-02-18: 80 mg via ORAL
  Filled 2022-02-18: qty 1

## 2022-02-18 MED ORDER — MOMETASONE FURO-FORMOTEROL FUM 100-5 MCG/ACT IN AERO
2.0000 | INHALATION_SPRAY | Freq: Two times a day (BID) | RESPIRATORY_TRACT | Status: DC
  Filled 2022-02-18: qty 8.8

## 2022-02-18 MED ORDER — ASPIRIN 81 MG PO TBEC
81.0000 mg | DELAYED_RELEASE_TABLET | Freq: Every day | ORAL | 2 refills | Status: DC
  Filled 2022-02-18: qty 30, 30d supply, fill #0

## 2022-02-18 MED ORDER — CLOPIDOGREL BISULFATE 75 MG PO TABS
75.0000 mg | ORAL_TABLET | Freq: Every day | ORAL | 0 refills | Status: DC
  Filled 2022-02-18 (×2): qty 21, 21d supply, fill #0

## 2022-02-18 MED ORDER — ASPIRIN EC 81 MG PO TBEC
81.0000 mg | DELAYED_RELEASE_TABLET | Freq: Every day | ORAL | Status: DC
  Administered 2022-02-18: 81 mg via ORAL
  Filled 2022-02-18: qty 1

## 2022-02-18 MED ORDER — ASPIRIN 81 MG PO TBEC: 81.0000 mg | DELAYED_RELEASE_TABLET | Freq: Every day | ORAL | 2 refills | Status: DC

## 2022-02-18 MED ORDER — ATORVASTATIN CALCIUM 80 MG PO TABS
80.0000 mg | ORAL_TABLET | Freq: Every day | ORAL | 2 refills | Status: AC
Start: 2022-02-19 — End: 2022-05-20
  Filled 2022-02-18 (×2): qty 30, 30d supply, fill #0

## 2022-02-18 NOTE — Consult Note (Signed)
Neurology Consult H&P  [name] MR# [id number] 02/18/2022  CC: right hand weakness  History is obtained from: Patient and chart.  HPI: [name] is a 66 y.o. female PMHx as reviewed below developed numbness of the right arm and right face which subsided initially she was worried however when the symptoms subsided she felt better.  She does have a history of endometrial cancer and is on lifelong chemotherapy.  A week ago she developed swelling and redness with pruritus of her face and lips for unknown reason which is now getting better.  The following information was taken from ED note 02/17/2022: "...numbness in right hand and right face and slurred speech, which occurred, while she was eating lunch today.  Both of these symptoms have resolved at the time she was evaluated by me.  Patient is receiving ongoing treatment for cancer, was seen yesterday in the ED for enlarged rash and felt to be secondary to chemotherapy.  She reports improvement of the rash of her face since starting prednisone.  Patient's sister was talked to the patient on FaceTime at the time I saw the patient and sister states that the patient did have dysarthria earlier but no dysarthria at 2:15 PM.  Her symptoms have improved.  No prior similar problems or history of stroke.  Per sister patient has ongoing disability of the left hip and left leg, and walks with a "shuffling."  LKW: 6160 02/17/2022 tNK given: No outside window IR Thrombectomy No, not a candidate Modified Rankin Scale: 0-Completely asymptomatic and back to baseline post- stroke NIHSS: 0  ROS: A complete ROS was performed and is negative except as noted in the HPI.  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  ChemoXRT 2005 Dr Collier Salina Rubin/[facility]   Upper airway cough syndrome    Wears glasses     Family History  Problem Relation Age of Onset   Allergies Mother    Asthma Mother    Lung cancer Mother    Allergies Sister    Allergies Brother    Asthma Brother    Asthma Sister    Sarcoidosis Neg Hx    Colon cancer Neg Hx    Liver cancer Neg Hx    Stomach cancer Neg Hx    Esophageal cancer Neg Hx    Rectal cancer Neg Hx     Social History:  reports that she quit smoking about 28 years ago. Her smoking use included cigarettes. She has a 0.50 pack-year smoking history. She has never used smokeless tobacco. She reports current alcohol use. She reports that  she does not use drugs.   Prior to Admission medications   Medication Sig Start Date End Date Taking? Authorizing Provider  acetaminophen (TYLENOL) 500 MG tablet Take 500 mg by mouth every 6 (six) hours as needed for mild pain.   Yes [provider]  albuterol (PROAIR HFA) 108 (90 Base) MCG/ACT inhaler 2 puffs every 4 hours as needed only  if your can't catch your breath Patient taking differently: Inhale 2 puffs into the lungs every 4 (four) hours as needed for wheezing or shortness of breath. 10/31/21  Yes Tanda Rockers, MD  bisoprolol (ZEBETA) 10 MG tablet Take 1 tablet (10 mg total) by mouth in the morning and at bedtime. 12/12/21  Yes Gorsuch, Ni, MD  cetirizine (ALLERGY, CETIRIZINE,) 10 MG tablet Take 1 tablet (10 mg total)  by mouth daily. 02/15/22 03/17/22 Yes Leath-Warren, Alda Lea, NP  desonide (DESOWEN) 0.05 % lotion Apply topically 2 (two) times daily. Continue following with dermatologist Patient taking differently: Apply 1 application topically 2 (two) times daily as needed (skin irritation). Continue following with dermatologist 09/29/19  Yes [name], [name], MD  diphenhydrAMINE (BENADRYL) 25 MG tablet Take 25 mg by mouth every 6 (six) hours as needed for allergies.   Yes [provider]  doxycycline (VIBRA-TABS) 100 MG tablet Take 1 tablet (100 mg total) by mouth 2 (two) times daily. 02/05/22  Yes Gorsuch, Ni, MD  famotidine (PEPCID) 20 MG tablet Take 1 tablet (20 mg total) by mouth daily for 10 days. 02/15/22 03/17/22 Yes Leath-Warren, Alda Lea, NP  hydrochlorothiazide (HYDRODIURIL) 25 MG tablet Take 1 tablet (25 mg total) by mouth daily. 12/04/21  Yes [name], MD  lenvatinib 20 mg daily dose (LENVIMA) 2 x 10 MG capsule Take 1 capsule (10 mg total) by mouth daily. 08/22/21  Yes [name], MD  levothyroxine (SYNTHROID) 100 MCG tablet Take 1 tablet (100 mcg total) by mouth daily before breakfast. 12/21/21  Yes Gorsuch, Ni, MD  lidocaine-prilocaine (EMLA) cream Apply 1 application topically as needed. 09/25/21  Yes Gorsuch, Ni, MD  loperamide (IMODIUM) 2 MG capsule Take 2 mg by mouth daily as needed for diarrhea or loose stools.   Yes [provider]  loratadine (CLARITIN) 10 MG tablet Take 10 mg by mouth daily as needed for allergies.   Yes [provider]  mometasone-formoterol (DULERA) 100-5 MCG/ACT AERO Take 2 puffs first thing in am and then another 2 puffs about 12 hours later. Patient taking differently: 2 puffs 2 (two) times daily as needed for wheezing or shortness of breath. 10/31/21  Yes Tanda Rockers, MD  predniSONE (DELTASONE) 10 MG tablet Take 6 tablets by mouth daily for 2 days, THEN 5 tablets daily for 2 days, THEN 4 tablets daily for 2 days, THEN 3 tablets daily for 2  days, THEN 2 tablets daily for 2 days, THEN 1 tablet daily for 2 days. 02/16/22 02/28/22 Yes Tacy Learn, PA-C  prochlorperazine (COMPAZINE) 10 MG tablet TAKE 1 TABLET BY MOUTH EVERY 6 HOURS AS NEEDED FOR NAUSEA OR VOMITING Patient taking differently: Take 10 mg by mouth every 6 (six) hours as needed for nausea or vomiting. 02/02/21 02/17/22 Yes Gorsuch, Ni, MD  triamcinolone (NASACORT) 55 MCG/ACT AERO nasal inhaler Place 2 sprays into each nostril daily. Patient taking differently: Place 2 sprays into the nose 2 (two) times daily as needed (congestion). 11/22/21  Yes Gorsuch, Ernst Spell, MD  vitamin C (ASCORBIC ACID) 500 MG tablet Take 500 mg by mouth daily.  Yes [provider]  ondansetron (ZOFRAN) 8 MG tablet Take 1 tablet (8 mg total) by mouth every 8 (eight) hours as needed for nausea. 05/19/20   [name], MD  predniSONE (STERAPRED UNI-PAK 21 TAB) 10 MG (21) TBPK tablet Take by mouth daily. Take 6 tabs by mouth daily  for 2 days, then 5 tabs for 2 days, then 4 tabs for 2 days, then 3 tabs for 2 days, 2 tabs for 2 days, then 1 tab by mouth daily for 2 days 02/16/22   Tacy Learn, PA-C    Exam: Current vital signs: BP (!) 161/83 (BP Location: Right Arm)    Pulse 71    Temp 97.8 F (36.6 C) (Oral)    Resp 16    Ht '5\' 5"'$  (1.651 m)    Wt 89.2 kg    LMP 12/17/2003    SpO2 95%    BMI 32.72 kg/m   Physical Exam  Constitutional: Appears well-developed and well-nourished.  Psych: Affect appropriate to situation Eyes: No scleral injection HENT: No OP obstruction.  She does have swelling in the upper left lip. Head: Normocephalic.  Cardiovascular: Normal rate and regular rhythm.  Respiratory: Effort normal, symmetric excursions bilaterally, no audible wheezing. GI: Soft.  No distension. There is no tenderness.  Skin: WDI  Neuro: Mental Status: Patient is awake, alert, oriented to person, place, month, year, and situation. Patient is able to give a clear and coherent history. Speech fluent,  intact comprehension and repetition. No signs of aphasia or neglect. Visual Fields are full. Pupils are equal, round, and reactive to light. EOMI without ptosis or diplopia.  Facial sensation is symmetric to temperature Facial movement is symmetric.  Hearing is intact to voice. Uvula midline and palate elevates symmetrically. Shoulder shrug is symmetric. Tongue is midline without atrophy or fasciculations.  Tone is normal. Bulk is normal. 5/5 strength was present in all four extremities. Sensation is symmetric to light touch and temperature in the arms and legs. Deep Tendon Reflexes: 2+ and symmetric in the biceps and patellae. Toes are downgoing bilaterally. FNF and HKS are intact bilaterally. Gait - Deferred  I have reviewed labs in epic and the pertinent results are: GLU 137  I have reviewed the images obtained: MRI brain showed subcentimeter acute infarct within the left corona radiata/caudate nucleus (series 6, image 29). Additional punctate acute infarct within the mid left frontal lobe  Assessment: [name] is a 66 y.o. female PMHx as noted above with slight right-hand weakness found to have acute stroke.  Initially felt numbness of the right face and arm which have now completely resolved.  Impression:  Acute ischemic stroke HTN HLD   Plan: - Recommend vascular imaging with CTA head and neck. - Recommend TTE. - Recommend labs: HbA1c, lipid panel. - Recommend Statin for goal LDL <70. - Goal A1c <7. - Aspirin '81mg'$  daily. - Clopidogrel '75mg'$  daily for 3 weeks. - SBP goal <150. - Telemetry monitoring for arrhythmia. - Recommend bedside Swallow screen. - Recommend Stroke education. - Recommend PT/OT/SLP consult.  Electronically signed by:  Lynnae Sandhoff, MD Page: [phone] 02/18/2022, 2:38 AM  If 7pm- 7am, please page neurology on call as listed in [place].

## 2022-02-18 NOTE — Evaluation (Signed)
Physical Therapy Evaluation ?Patient Details ?Name: [name] ?MRN: [id number] ?DOB: [birth year] ?Today's Date: 02/18/2022 ? ?History of Present Illness ? Pt is a 66yo female brought to ED with R UE and facial numbness, which subsided prior to arrival to ED. 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 PMH: endometrial ca with lifelong chemo. rash from chemo t/o face ?  ?Clinical Impression ? Pt admitted with above. Pts symptoms have resolved and is now near baseline. Pt to benefit from RW for safe ambulation in home as pt is furniture walking and has had a recent fall. Pt with report of feeling safer with RW. Acute PT follow to maintain mobility. Acute PT to cont to follow.   ?   ? ?Recommendations for follow up therapy are one component of a multi-disciplinary discharge planning process, led by the attending physician.  Recommendations may be updated based on patient status, additional functional criteria and insurance authorization. ? ?Follow Up Recommendations No PT follow up ? ?  ?Assistance Recommended at Discharge Intermittent Supervision/Assistance  ?Patient can return home with the following ? A little help with walking and/or transfers;A little help with bathing/dressing/bathroom;Help with stairs or ramp for entrance;Assist for transportation ? ?  ?Equipment Recommendations Rolling walker (2 wheels)  ?Recommendations for Other Services ?    ?  ?Functional Status Assessment Patient has had a recent decline in their functional status and demonstrates the ability to make significant improvements in function in a reasonable and predictable amount of time.  ? ?  ?Precautions / Restrictions Precautions ?Precautions: Fall ?Precaution Comments: recent fall in dec ?Restrictions ?Weight Bearing Restrictions: No  ? ?  ? ?Mobility ? Bed Mobility ?Overal bed mobility: Modified Independent ?  ?  ?  ?  ?  ?  ?General bed mobility comments: HOB elevated, has adjustable bed at  home ?  ? ?Transfers ?Overall transfer level: Needs assistance ?Equipment used: None ?Transfers: Sit to/from Stand ?Sit to Stand: Min guard ?  ?  ?  ?  ?  ?General transfer comment: min guard to power up, pt slow and guarded, this is baseline ?  ? ?Ambulation/Gait ?Ambulation/Gait assistance: Min guard, Supervision ?Gait Distance (Feet): 75 Feet (x2) ?Assistive device: Rolling walker (2 wheels), None ?Gait Pattern/deviations: Step-through pattern, Decreased stride length, Wide base of support ?  ?Gait velocity interpretation: <1.31 ft/sec, indicative of household ambulator ?  ?General Gait Details: pt initially desired to amb without AD, pt reaching for walls and hallway rails with slow, gingerly, guarded gait. PT recommend use of RW as pt desiring to hold onto objects stating "this is what I do at home." Pt trialed RW and reports "i feel more steady with the RW." Pt with noted improved cadence and step height and length. ? ?Stairs ?  ?  ?  ?  ?  ? ?Wheelchair Mobility ?  ? ?Modified Rankin (Stroke Patients Only) ?Modified Rankin (Stroke Patients Only) ?Pre-Morbid Rankin Score: Slight disability ?Modified Rankin: Slight disability ? ?  ? ?Balance Overall balance assessment: Needs assistance ?Sitting-balance support: No upper extremity supported, Feet supported ?Sitting balance-Leahy Scale: Good ?  ?  ?Standing balance support: Bilateral upper extremity supported, During functional activity ?  ?Standing balance comment: pt benefits from UE support ?  ?  ?  ?  ?  ?  ?  ?  ?  ?  ?  ?   ? ? ? ?Pertinent Vitals/Pain Pain Assessment ?Pain Assessment: No/denies pain  ? ? ?Home Living Family/patient  expects to be discharged to:: Private residence ?Living Arrangements: Children (dtr and granddtr) ?Available Help at Discharge: Family;Available PRN/intermittently (24/7 could be arranged if needed) ?Type of Home: House ?Home Access: Stairs to enter ?Entrance Stairs-Rails: Can reach both ?Entrance Stairs-Number of Steps: 1 ?   ?Home Layout: One level ?Home Equipment: Shower seat;Rollator (4 wheels) (adjustable bed) ?   ?  ?Prior Function Prior Level of Function : Independent/Modified Independent ?  ?  ?  ?  ?  ?  ?Mobility Comments: uses RW occasionally in community ?ADLs Comments: independent, uses shower seat in shower ?  ? ? ?Hand Dominance  ? Dominant Hand: Right ? ?  ?Extremity/Trunk Assessment  ? Upper Extremity Assessment ?Upper Extremity Assessment: RUE deficits/detail ?RUE Deficits / Details: grip 3+/5 ?  ? 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  ? ?Cervical / Trunk Assessment ?Cervical / Trunk Assessment: Normal  ?Communication  ? Communication: No difficulties  ?Cognition Arousal/Alertness: Awake/alert ?Behavior During Therapy: [facility] for tasks assessed/performed ?Overall Cognitive Status: Within Functional Limits for tasks assessed ?  ?  ?  ?  ?  ?  ?  ?  ?  ?  ?  ?  ?  ?  ?  ?  ?  ?  ?  ? ?  ?General Comments General comments (skin integrity, edema, etc.): pt with healing rash over face from previous reaction to chemo, noted bilat LE edema - this is baseline ? ?  ?Exercises    ? ?Assessment/Plan  ?  ?PT Assessment Patient needs continued PT services  ?PT Problem List Decreased strength;Decreased activity tolerance;Decreased balance;Decreased knowledge of use of DME ? ?   ?  ?PT Treatment Interventions DME instruction;Gait training;Stair training;Functional mobility training;Therapeutic activities;Therapeutic exercise;Balance training   ? ?PT Goals (Current goals can be found in the Care Plan section)  ?Acute Rehab PT Goals ?Patient Stated Goal: home ?PT Goal Formulation: With patient ?Time For Goal Achievement: 03/04/22 ?Potential to Achieve Goals: Good ? ?  ?Frequency Min 3X/week ?  ? ? ?Co-evaluation   ?  ?  ?  ?  ? ? ?  ?AM-PAC PT "6 Clicks" Mobility  ?Outcome Measure Help needed turning from your back to your side while in a flat bed without using bedrails?:  None ?Help needed moving from lying on your back to sitting on the side of a flat bed without using bedrails?: None ?Help needed moving to and from a bed to a chair (including a wheelchair)?: None ?Help needed standing up from a chair using your arms (e.g., wheelchair or bedside chair)?: A Little ?Help needed to walk in hospital room?: A Little ?Help needed climbing 3-5 steps with a railing? : A Little ?6 Click Score: 21 ? ?  ?End of Session Equipment Utilized During Treatment: Gait belt ?Activity Tolerance: Patient tolerated treatment well ?Patient left: in bed;with call bell/phone within reach;with bed alarm set (with ECHO technician) ?Nurse Communication: Mobility status ?PT Visit Diagnosis: Unsteadiness on feet (R26.81);Muscle weakness (generalized) (M62.81);Difficulty in walking, not elsewhere classified (R26.2) ?  ? ?Time: 0539-7673 ?PT Time Calculation (min) (ACUTE ONLY): 35 min ? ? ?Charges:   PT Evaluation ?$PT Eval Moderate Complexity: 1 Mod ?PT Treatments ?$Gait Training: 8-22 mins ?  ?   ? ? ?Kittie Plater, PT, DPT ?Acute Rehabilitation Services ?Pager #: [phone] ?Office #: [phone] ? ? ?Sharman Garrott M Yohann Curl ?02/18/2022, 8:34 AM ? ?

## 2022-02-18 NOTE — Evaluation (Signed)
Speech Language Pathology Evaluation Patient Details Name: [name] MRN: [id number] DOB: [birth year] Today's Date: 02/18/2022 Time: 7517-0017 SLP Time Calculation (min) (ACUTE ONLY): 20 min  Problem List:  Patient Active Problem List   Diagnosis Date Noted   Acute CVA (cerebrovascular accident) ([place]) 02/17/2022   Cellulitis 02/05/2022   Physical debility 12/21/2021   Diarrhea 10/16/2021   Left ear pain 09/25/2021   Acquired hypothyroidism 09/25/2021   Yeast infection of the skin 09/25/2021   Dehydration 02/06/2021   UTI (urinary tract infection) 01/10/2021   Dysuria 01/09/2021   Anemia due to antineoplastic chemotherapy 12/11/2020   Drug-induced hyperglycemia 09/27/2020   Deficiency anemia 08/30/2020   Peripheral neuropathy due to chemotherapy ([place]) 08/30/2020   Other fatigue 06/22/2020   Pancytopenia, acquired ([place]) 06/20/2020   Goals of care, counseling/discussion 06/01/2020   Lung nodule seen on imaging study 05/19/2020   Nausea and vomiting 05/19/2020   Hydroureter on left 05/18/2020   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[place]) 05/18/2020   Cancer associated pain 05/11/2020   Other constipation 05/11/2020   Squamous cell cancer, 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[place])    Sinusitis, acute maxillary 12/15/2018   DOE (dyspnea on exertion) 09/17/2018   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   Endometrial cancer ([place]) 05/26/2018   Pain in right hand 05/12/2018   Cervical intraepithelial neoplasia 05/06/2018   Poor dentition 07/07/2017   Morbid obesity due to excess calories ([place]) complicated by hbp 49/44/9675   Cough 03/17/2017   Rash and nonspecific skin eruption 02/18/2017   Hyperlipidemia, mixed 02/17/2017   Perianal condylomata 08/12/2016   Genital herpes 02/09/2015   Fever and chills 01/03/2014   Wheezing 01/03/2014   Bifid uvula    Bilateral lower extremity edema 09/09/2011   Osteopenia 07/18/2011   Upper airway cough  syndrome vs cough variant asthma  06/24/2011   Sarcoidosis ([place]) 03/07/2008   BMI 38.0-38.9,adult 03/07/2008   Essential hypertension 03/07/2008   G E R D 03/07/2008   History of anal cancer 12/17/2003   Past Medical History:  Past Medical History:  Diagnosis Date   Bifid uvula    Blood transfusion without reported diagnosis    Endometrial ca ([place]) dx'd 06/2018   chemo   GERD (gastroesophageal reflux disease)    History of cervical dysplasia    History of chemotherapy    06/ 2005   History of cleft lip    CORRECTED   History of condyloma acuminatum    PERIANAL   History of radiation therapy    completed 06/ 2005   History of vulvar dysplasia    Hypertension    Median nerve lesion at elbow, right    Pulmonary nodule, left    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) dx 04/ 2005   S/P  local resection 03/2004.  ChemoXRT 2005 Dr Collier [place]ina Rubin/[facility]   Upper airway cough syndrome    Wears glasses    Past Surgical History:  Past Surgical History:  Procedure Laterality Date   BRONCHOSCOPY  01/22/2008   w/ Lavage and bronchial bx   CARDIOVASCULAR STRESS TEST  06/27/2006   normal nuclear study w/ no ischemia/  normal LV function and wall motion , ef 61%   CARPAL TUNNEL RELEASE Right 01/17/2016   and finger trigger  release   CLEFT LIP REPAIR     CO2 LASER ABLATION VULVA     EUA/  EXCISIONAL MULTIPLE PERINEAL/ANAL BX'S/  SIGMOIDSCOPY/  PORT-A-CATH PLACEMENT  03/27/2004   Local excision - Dr Lennie Hummer   IR IMAGING GUIDED PORT INSERTION  05/26/2020   LAPAROSCOPIC CHOLECYSTECTOMY  08/14/2008   MULTIPLE ANAL BX'S  10/10/2006   RELEASE A1 PULLEY THUMB Bilateral left 12-17-2006/  right 09-05-2009   REMOVAL PORT-A-CATH/  EUA/  MULTIPLE ANAL BX'S  10/16/2004   ROBOTIC ASSISTED TOTAL HYSTERECTOMY  WITH BILATERAL SALPINGO OOPHERECTOMY Bilateral 06/25/2018   Procedure: XI ROBOTIC ASSISTED TOTAL HYSTERECTOMY WITH BILATERAL SALPINGO OOPHORECTOMY;  Surgeon: Everitt Amber, MD;  Location: [facility];  Service: Gynecology;  Laterality: Bilateral;   SENTINEL NODE BIOPSY N/A 06/25/2018   Procedure: SENTINEL NODE BIOPSY;  Surgeon: Everitt Amber, MD;  Location: [facility];  Service: Gynecology;  Laterality: N/A;   TENNIS ELBOW RELEASE/NIRSCHEL PROCEDURE Right 11/28/2016   Procedure: RIGHT ELBOW ULNER NERVE RELEASE AND OR TRANSPOSITION;  Surgeon: Iran Planas, MD;  Location: [facility];  Service: Orthopedics;  Laterality: Right;   HPI:  [name] is a 66 y.o. female with medical history significant of rectal cancer s/p resection and XRT in 2005. h/o HTN, HLD, endometrial cancer s/p TAH BSO in 2019, with intrapelvic lymph nodes, getting chemo sicne 05/2020 to 06/2021. Presented to the ED d/t right side facial numbness and right hand numbness and slurred speech. MRI revealed "subcentimeter acute infarct within the left corona radiata/caudate  nucleus. Additional punctate acute infarct within the mid left frontal lobe."   Assessment / Plan / Recommendation Clinical Impression  The pt was seen at bedside for cognitive-linguistic evaluation. The patient denied any deficits in the aforementioned areas, and reports that she is back to her baseline level of functioning. SLP administered the SLUMS and pt scored 24/30, indicating a mild cognitive impairment. Primary area of deficit for the patient is probelm-solving. She had increased difficulty with basic money management problem and repeating numbers backwards. Patient received education about having help after d/c with financial management and balancing her checkbook. Pt reports that her sister typically helps with medication management, and can assist in overseeing finances. SLP to sign off as education is completed and pt back to prior level of functioning.     SLP Assessment  SLP Recommendation/Assessment: Patient does not need any further [facility] [place] [facility] SLP Visit Diagnosis: Cognitive communication deficit (R41.841)    Recommendations for follow up therapy are one component of a multi-disciplinary discharge planning process, led by the attending physician.  Recommendations may be updated based on patient status, additional functional criteria and insurance authorization.    Follow Up Recommendations  No SLP follow up    Assistance Recommended at Discharge  PRN  Functional Status Assessment Patient has had a recent decline in their functional status and demonstrates the ability to make significant improvements in function in a reasonable and predictable amount of time.  Frequency and Duration           SLP Evaluation Cognition  Overall Cognitive Status: Within Functional Limits for tasks assessed Arousal/Alertness: Awake/alert Orientation Level: Oriented to person;Oriented to time;Oriented to situation Year: 2023 Day of Week: Correct Attention: Focused Focused Attention: Appears intact Memory: Appears intact Awareness: Appears intact Problem Solving: Impaired Problem Solving Impairment: Verbal basic Safety/Judgment: Appears intact       Comprehension  Auditory Comprehension Overall Auditory Comprehension: Appears within functional limits for tasks assessed Yes/No Questions: Within Functional Limits  Commands: Not tested Conversation: Simple Visual Recognition/Discrimination Discrimination: Not tested Reading Comprehension Reading Status: Not tested    Expression Expression Primary Mode of Expression: Verbal Verbal Expression Overall Verbal Expression: Appears within functional limits for tasks assessed Initiation: No impairment Automatic Speech: Name;Social Response Level of Generative/Spontaneous Verbalization: Conversation Repetition:  (NT) Naming: No impairment Pragmatics: No  impairment Non-Verbal Means of Communication: Not applicable Written Expression Dominant Hand: Right Written Expression: Not tested   Oral / Motor  Oral Motor/Sensory Function Overall Oral Motor/Sensory Function: Within functional limits Motor Speech Overall Motor Speech: Appears within functional limits for tasks assessed Respiration: Within functional limits Phonation: Normal Resonance: Within functional limits Articulation: Within functional limitis Intelligibility: Intelligible Motor Planning: Witnin functional limits Motor Speech Errors: Not applicable            [employer] 02/18/2022, 12:49 PM

## 2022-02-18 NOTE — Discharge Summary (Signed)
Physician Discharge Summary  [name] HEN:[id number] DOB: January 03, 1956 DOA: 02/17/2022  PCP: [name], [name], MD  Admit date: 02/17/2022 Discharge date: 02/18/2022  Admitted From: Home Disposition: Home  Recommendations for Outpatient Follow-up:  Follow up with PCP in 1-2 weeks Keep up follow-up with oncology Will refer to neurology for outpatient follow-up We will refer to outpatient imaging study for lower extremity duplexes.  Home Health: N/A Equipment/Devices: N/A  Discharge Condition: Stable CODE STATUS: Full code Diet recommendation: Low-salt diet.    Discharge summary:  66 year old female with history of rectal cancer status postresection and radiation therapy, sarcoidosis not on any medications, history of hypertension hyperlipidemia, endometrial cancer status post total abdominal hysterectomy 2019, currently on chemotherapy presented to the emergency room with right-sided facial numbness and right hand numbness for about 30 minutes and slurred speech resolved by the time she came to the emergency room.  MRI showed 2 small infarcts MCA distribution.  Admitted for stroke work-up.  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 Clinical findings, presented with transient right face and hand numbness.  Improved since arrival to emergency room. CT head findings, no acute abnormalities. MRI of the brain, subcentimeter acute infarct left coronary radiator/caudate nucleus CTA of the head and neck, no large vessel occlusion.  Mild atherosclerotic disease. 2D echocardiogram, normal ejection fraction.  No intracardiac thrombus. Antiplatelet therapy, none before admission.  Planning to discharge on aspirin 81 mg daily, Plavix 75 mg daily for 3 weeks. LDL 185.  Currently not on a statin.  Recommended atorvastatin 80 mg daily. Hemoglobin A1c, 4.8.  No intervention needed.  Patient currently asymptomatic and willing to go home and complete outpatient investigations. Patient does have history  of cancer, possible hypercoagulable state.  No clinical evidence of DVT, however needs to look for any.  Outpatient studies, -Lower extremity DVT studies, referral sent.  If positive, she will need TEE with bubble studies to look for any PFO. -Patient is scheduled for pan CT to evaluate her malignancy status, this was communicated with her oncologist.  Patient will keep up with her CT scan appointment on 3/8. -Referral sent to neurology for follow-up.  Hypertension: Blood pressure stable on bisoprolol, hydrochlorothiazide. Hyperlipidemia: LDL 185 as above.  Added high-dose statin. Endometrial cancer, as above.  Hypercoagulable state.  Currently on lenvatinib.  Followed by oncology.  Communicated with concern about a stroke. Facial cellulitis and rash: Currently taking prednisone taper and doxycycline.  She will continue.  Since patient was stable and able to keep up with outpatient appointments and investigations, she was discharged home.    Discharge Diagnoses:  Principal Problem:   Acute CVA (cerebrovascular accident) [facility]) Active Problems:   Sarcoidosis ([place])   Essential hypertension   Bilateral lower extremity edema   Endometrial cancer [facility])    Discharge Instructions  Discharge Instructions     Ambulatory referral to Neurology   Complete by: As directed    An appointment is requested in approximately: 4 weeks   Diet - low sodium heart healthy   Complete by: As directed    Increase activity slowly   Complete by: As directed    VAS [place] LOWER EXTREMITY VENOUS (DVT)   Complete by:  Feb 25, 2022 (Approximate)    Laterality: Bilateral   Vascular Diagnosis: Pain in lower limb, [id number]   Where should this test be performed: [place]            Allergies as of 02/18/2022       Reactions   Shellfish Allergy  Hives, Itching, Swelling   Penicillins Hives, Itching   Has patient had a PCN reaction causing immediate rash, facial/tongue/throat swelling, SOB or lightheadedness  with hypotension: Yes Has patient had a PCN reaction causing severe rash involving mucus membranes or skin necrosis: Yes Has patient had a PCN reaction that required hospitalization: Yes Has patient had a PCN reaction occurring within the last 10 years: No If all of the above answers are "NO", then may proceed with Cephalosporin use.        Medication List     TAKE these medications    acetaminophen 500 MG tablet Commonly known as: TYLENOL Take 500 mg by mouth every 6 (six) hours as needed for mild pain.   albuterol 108 (90 Base) MCG/ACT inhaler Commonly known as: ProAir HFA 2 puffs every 4 hours as needed only  if your can't catch your breath What changed:  how much to take how to take this when to take this reasons to take this additional instructions   aspirin 81 MG EC tablet Take 1 tablet (81 mg total) by mouth daily. Swallow whole. Start taking on: February 19, 2022   atorvastatin 80 MG tablet Commonly known as: LIPITOR Take 1 tablet (80 mg total) by mouth daily. Start taking on: February 19, 2022   bisoprolol 10 MG tablet Commonly known as: ZEBETA Take 1 tablet (10 mg total) by mouth in the morning and at bedtime.   cetirizine 10 MG tablet Commonly known as: Allergy (Cetirizine) Take 1 tablet (10 mg total) by mouth daily.   clopidogrel 75 MG tablet Commonly known as: Plavix Take 1 tablet (75 mg total) by mouth daily for 21 days.   desonide 0.05 % lotion Commonly known as: DesOwen Apply topically 2 (two) times daily. Continue following with dermatologist What changed:  how much to take when to take this reasons to take this   diphenhydrAMINE 25 MG tablet Commonly known as: BENADRYL Take 25 mg by mouth every 6 (six) hours as needed for allergies.   doxycycline 100 MG tablet Commonly known as: VIBRA-TABS Take 1 tablet (100 mg total) by mouth 2 (two) times daily.   famotidine 20 MG tablet Commonly known as: PEPCID Take 1 tablet (20 mg total) by mouth daily  for 10 days.   hydrochlorothiazide 25 MG tablet Commonly known as: HYDRODIURIL Take 1 tablet (25 mg total) by mouth daily.   lenvatinib 20 mg daily dose 2 x 10 MG capsule Commonly known as: LENVIMA Take 1 capsule (10 mg total) by mouth daily.   levothyroxine 100 MCG tablet Commonly known as: Synthroid Take 1 tablet (100 mcg total) by mouth daily before breakfast.   lidocaine-prilocaine cream Commonly known as: EMLA Apply 1 application topically as needed.   loperamide 2 MG capsule Commonly known as: IMODIUM Take 2 mg by mouth daily as needed for diarrhea or loose stools.   loratadine 10 MG tablet Commonly known as: CLARITIN Take 10 mg by mouth daily as needed for allergies.   mometasone-formoterol 100-5 MCG/ACT Aero Commonly known as: DULERA Take 2 puffs first thing in am and then another 2 puffs about 12 hours later. What changed:  how much to take when to take this reasons to take this additional instructions   ondansetron 8 MG tablet Commonly known as: ZOFRAN Take 1 tablet (8 mg total) by mouth every 8 (eight) hours as needed for nausea.   predniSONE 10 MG (21) Tbpk tablet Commonly known as: STERAPRED UNI-PAK 21 TAB Take by mouth daily. Take  6 tabs by mouth daily  for 2 days, then 5 tabs for 2 days, then 4 tabs for 2 days, then 3 tabs for 2 days, 2 tabs for 2 days, then 1 tab by mouth daily for 2 days   predniSONE 10 MG tablet Commonly known as: DELTASONE Take 6 tablets by mouth daily for 2 days, THEN 5 tablets daily for 2 days, THEN 4 tablets daily for 2 days, THEN 3 tablets daily for 2 days, THEN 2 tablets daily for 2 days, THEN 1 tablet daily for 2 days. Start taking on: February 16, 2022   prochlorperazine 10 MG tablet Commonly known as: COMPAZINE TAKE 1 TABLET BY MOUTH EVERY 6 HOURS AS NEEDED FOR NAUSEA OR VOMITING What changed:  how much to take reasons to take this   triamcinolone 55 MCG/ACT Aero nasal inhaler Commonly known as: NASACORT Place 2 sprays  into each nostril daily. What changed:  when to take this reasons to take this   vitamin C 500 MG tablet Commonly known as: ASCORBIC ACID Take 500 mg by mouth daily.               Durable Medical Equipment  (From admission, onward)           Start     Ordered   02/18/22 1059  For home use only DME Walker rolling  Once       Question Answer Comment  Walker: With 5 Inch Wheels   Patient needs a walker to treat with the following condition Stroke ([place])      02/18/22 1059            Allergies  Allergen Reactions   Shellfish Allergy Hives, Itching and Swelling   Penicillins Hives and Itching    Has patient had a PCN reaction causing immediate rash, facial/tongue/throat swelling, SOB or lightheadedness with hypotension: Yes Has patient had a PCN reaction causing severe rash involving mucus membranes or skin necrosis: Yes Has patient had a PCN reaction that required hospitalization: Yes Has patient had a PCN reaction occurring within the last 10 years: No If all of the above answers are "NO", then may proceed with Cephalosporin use.     Consultations: Neurology   Procedures/Studies: CT ANGIO HEAD NECK W WO CM  Result Date: 02/18/2022 CLINICAL DATA:  Stroke, follow up EXAM: CT ANGIOGRAPHY HEAD AND NECK TECHNIQUE: Multidetector CT imaging of the head and neck was performed using the standard protocol during bolus administration of intravenous contrast. Multiplanar CT image reconstructions and MIPs were obtained to evaluate the vascular anatomy. Carotid stenosis measurements (when applicable) are obtained utilizing NASCET criteria, using the distal internal carotid diameter as the denominator. RADIATION DOSE REDUCTION: This exam was performed according to the departmental dose-optimization program which includes automated exposure control, adjustment of the mA and/or kV according to patient size and/or use of iterative reconstruction technique. CONTRAST:  63m OMNIPAQUE  IOHEXOL 350 MG/ML SOLN COMPARISON:  CT and MRI February 17, 2022. FINDINGS: CT HEAD FINDINGS Brain: Subcentimeter infarcts seen on recent MRI is not visible by CT. No evidence of acute large vascular territory infarct, acute hemorrhage, mass lesion, midline shift, or hydrocephalus. Vascular: See below. Skull: No acute fracture. Sinuses: Kozel thickening of the visualized maxillary sinuses and scattered ethmoid air cells. Frothy secretions in the left sphenoid sinus. Orbits: No acute finding. Review of the MIP images confirms the above findings CTA NECK FINDINGS Aortic arch: Great vessel origins are patent with mild atherosclerotic narrowing. Right carotid system: Mixed  calcific and noncalcific atherosclerosis at the carotid bifurcation without greater than 50% stenosis. Left carotid system: Mixed calcific and noncalcific atherosclerosis involving the common carotid artery and carotid bifurcation without greater than 50% stenosis. Vertebral arteries: Right dominant. No evidence of significant (greater than 50%) stenosis. Mild narrowing of the vertebral artery origins bilaterally due to atherosclerosis. Skeleton: No evidence of acute abnormality. Other neck: No evidence of acute abnormality. Upper chest: Increased size/bulk of the band like opacity previously seen on 11/21/2021 CT chest, which is partially imaged on this study. There is suggestion that this area may be spiculated. Review of the MIP images confirms the above findings CTA HEAD FINDINGS Anterior circulation: Mild narrowing of bilateral intracranial ICAs due to calcific atherosclerosis. Bilateral MCAs and ACAs are patent without proximal hemodynamically significant stenosis. Approximately 1-2 mm inferiorly directed outpouching arising from the right supraclinoid ICA (series 12, image 70), which could represent an infundibulum with vessel not well seen or small aneurysm. Posterior circulation: Right dominant intradural vertebral artery. Bilateral intradural  vertebral arteries, basilar artery, and posterior cerebral arteries are patent without significant proximal stenosis. Mild multifocal narrowing of the PCAs bilaterally. Venous sinuses: As permitted by contrast timing, patent. Anatomic variants: See above. Review of the MIP images confirms the above findings IMPRESSION: CTA head: 1. No large vessel occlusion. 2. Multiple sites of mild atherosclerotic narrowing without proximal hemodynamically significant stenosis. 3. Approximately 1-2 mm inferiorly directed outpouching arising from the right supraclinoid ICA, which could represent an infundibulum with vessel not well seen or small aneurysm. CTA neck: 1. No significant (greater than 50%) stenosis. 2. Bilateral carotid bifurcation atherosclerosis. 3. Increased size/bulk of the band like opacity previously seen on 11/21/2021 CT chest, which is partially imaged on this study. This could potentially be infectious/inflammatory; however, given the interval increase in size and the potentially spiculated appearance, malignancy is not excluded. Recommend chest CT to fully evaluate and to establish follow-up recommendations. These results will be called to the ordering clinician or representative by the Radiologist Assistant, and communication documented in the PACS or [employer]. Electronically Signed   By: Margaretha Sheffield M.D.   On: 02/18/2022 09:35   CT HEAD WO CONTRAST  Result Date: 02/17/2022 CLINICAL DATA:  Acute neurologic deficit. EXAM: CT HEAD WITHOUT CONTRAST TECHNIQUE: Contiguous axial images were obtained from the base of the skull through the vertex without intravenous contrast. RADIATION DOSE REDUCTION: This exam was performed according to the departmental dose-optimization program which includes automated exposure control, adjustment of the mA and/or kV according to patient size and/or use of iterative reconstruction technique. COMPARISON:  04/14/2010 FINDINGS: Brain: No evidence of acute infarction,  hemorrhage, hydrocephalus, extra-axial collection or mass lesion/mass effect. Vascular: No hyperdense vessel or unexpected calcification. Skull: Normal. Negative for fracture or focal lesion. Sinuses/Orbits: Mucosal thickening is noted involving the ethmoid air cells and left maxillary sinus. Other: None IMPRESSION: 1. No acute intracranial abnormalities. 2. Chronic sinus inflammation. Electronically Signed   By: Kerby Moors M.D.   On: 02/17/2022 14:57   MR BRAIN WO CONTRAST  Result Date: 02/17/2022 CLINICAL DATA:  Neuro deficit, acute, stroke suspected. Right-sided facial numbness and left-sided facial droop. EXAM: MRI HEAD WITHOUT CONTRAST TECHNIQUE: Multiplanar, multiecho pulse sequences of the brain and surrounding structures were obtained without intravenous contrast. COMPARISON:  Head CT 02/17/2022. FINDINGS: Brain: Cerebral volume appears normal for age. Subcentimeter acute infarct within the left corona radiata/caudate nucleus (series 6, image 29). Additional punctate acute infarct within the mid left frontal lobe (series 5, image  28). Mild multifocal T2 FLAIR hyperintense signal abnormality within the cerebral white matter and pons, nonspecific but compatible with chronic small vessel ischemic disease. No evidence of an intracranial mass. No chronic intracranial blood products. No extra-axial fluid collection. No midline shift. Vascular: Maintained flow voids within the proximal large arterial vessels. Skull and upper cervical spine: No focal suspicious marrow lesion. Sinuses/Orbits: Visualized orbits show no acute finding. Mild mucosal thickening within the left frontal sinus. Mild-to-moderate mucosal thickening and scattered fluid within the bilateral ethmoid air cells. Mucous retention cyst within a posterior right ethmoid air cell. Small volume frothy secretions within the left sphenoid sinus. Mild-to-moderate mucosal thickening within the bilateral maxillary sinuses. Other: Trace fluid within the  right mastoid air cells. 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 Additional punctate acute infarct within the mid left frontal lobe. Mild chronic small vessel ischemic changes within the cerebral white matter and pons. Paranasal sinus disease, as described. Trace fluid within the right mastoid air cells. Electronically Signed   By: Kellie Simmering D.O.   On: 02/17/2022 17:10   ECHOCARDIOGRAM COMPLETE BUBBLE STUDY  Result Date: 02/18/2022    ECHOCARDIOGRAM REPORT   Patient Name:   [name] Date of Exam: 02/18/2022 Medical Rec #:  [id number]     Height:       65.0 in Accession #:    [id number]    Weight:       196.6 lb Date of Birth:  [birth year]    BSA:          1.964 m Patient Age:    66 years      BP:           177/88 mmHg Patient Gender: F             HR:           59 bpm. Exam Location:  Inpatient Procedure: 2D Echo, Color Doppler, Cardiac Doppler and Strain Analysis Indications:    CVA  History:        Patient has no prior history of Echocardiogram examinations.                 Risk Factors:Hypertension.  Sonographer:    Luisa Hart RDCS Referring Phys: [id number] [place]  1. Left ventricular ejection fraction, by estimation, is 60 to 65%. The left ventricle has normal function. The left ventricle has no regional wall motion abnormalities. Left ventricular diastolic parameters are consistent with Grade I diastolic dysfunction (impaired relaxation).  2. Right ventricular systolic function is normal. The right ventricular size is normal. There is normal pulmonary artery systolic pressure. The estimated right ventricular systolic pressure is 61.4 mmHg.  3. Left atrial size was mild to moderately dilated.  4. The mitral valve is normal in structure. No evidence of mitral valve regurgitation. No evidence of mitral stenosis.  5. The aortic valve is tricuspid. There is moderate calcification of the aortic valve. Aortic valve regurgitation is not visualized. No  aortic stenosis is present.  6. The inferior vena cava is normal in size with greater than 50% respiratory variability, suggesting right atrial pressure of 3 mmHg.  7. Negative bubble study, no evidence for PFO/ASD. FINDINGS  Left Ventricle: Left ventricular ejection fraction, by estimation, is 60 to 65%. The left ventricle has normal function. The left ventricle has no regional wall motion abnormalities. The left ventricular internal cavity size was normal in size. There is  no left ventricular hypertrophy. Left ventricular diastolic parameters are  consistent with Grade I diastolic dysfunction (impaired relaxation). Right Ventricle: The right ventricular size is normal. No increase in right ventricular wall thickness. Right ventricular systolic function is normal. There is normal pulmonary artery systolic pressure. The tricuspid regurgitant velocity is 2.59 m/s, and  with an assumed right atrial pressure of 3 mmHg, the estimated right ventricular systolic pressure is 25.6 mmHg. Left Atrium: Left atrial size was mild to moderately dilated. Right Atrium: Right atrial size was normal in size. Pericardium: There is no evidence of pericardial effusion. Mitral Valve: The mitral valve is normal in structure. There is mild calcification of the mitral valve leaflet(s). Mild mitral annular calcification. No evidence of mitral valve regurgitation. No evidence of mitral valve stenosis. Tricuspid Valve: The tricuspid valve is normal in structure. Tricuspid valve regurgitation is trivial. Aortic Valve: The aortic valve is tricuspid. There is moderate calcification of the aortic valve. Aortic valve regurgitation is not visualized. No aortic stenosis is present. Aortic valve mean gradient measures 6.0 mmHg. Aortic valve peak gradient measures 12.4 mmHg. Aortic valve area, by VTI measures 1.63 cm. Pulmonic Valve: The pulmonic valve was normal in structure. Pulmonic valve regurgitation is not visualized. Aorta: The aortic root is  normal in size and structure. Venous: The inferior vena cava is normal in size with greater than 50% respiratory variability, suggesting right atrial pressure of 3 mmHg. IAS/Shunts: Negative bubble study, no evidence for PFO/ASD.  LEFT VENTRICLE PLAX 2D LVIDd:         3.60 cm     Diastology LVIDs:         1.80 cm     LV e' medial:    5.35 cm/s LV PW:         1.00 cm     LV E/e' medial:  15.7 LV IVS:        1.10 cm     LV e' lateral:   4.74 cm/s LVOT diam:     1.80 cm     LV E/e' lateral: 17.7 LV SV:         70 LV SV Index:   36 LVOT Area:     2.54 cm  LV Volumes (MOD) LV vol d, MOD A2C: 37.8 ml LV vol d, MOD A4C: 50.6 ml LV vol s, MOD A2C: 10.3 ml LV vol s, MOD A4C: 13.4 ml LV SV MOD A2C:     27.5 ml LV SV MOD A4C:     50.6 ml LV SV MOD BP:      34.1 ml RIGHT VENTRICLE RV Basal diam:  3.10 cm RV Mid diam:    2.10 cm RV S prime:     9.00 cm/s TAPSE (M-mode): 2.4 cm LEFT ATRIUM             Index        RIGHT ATRIUM           Index LA diam:        4.10 cm 2.09 cm/m   RA Area:     15.10 cm LA Vol (A2C):   73.9 ml 37.63 ml/m  RA Volume:   42.00 ml  21.39 ml/m LA Vol (A4C):   76.5 ml 38.95 ml/m LA Biplane Vol: 76.2 ml 38.80 ml/m  AORTIC VALVE                     PULMONIC VALVE AV Area (Vmax):    1.74 cm      PV Vmax:  0.75 m/s AV Area (Vmean):   1.62 cm      PV Peak grad:  2.3 mmHg AV Area (VTI):     1.63 cm AV Vmax:           176.00 cm/s AV Vmean:          116.000 cm/s AV VTI:            0.432 m AV Peak Grad:      12.4 mmHg AV Mean Grad:      6.0 mmHg LVOT Vmax:         120.00 cm/s LVOT Vmean:        73.700 cm/s LVOT VTI:          0.277 m LVOT/AV VTI ratio: 0.64  AORTA Ao Asc diam: 2.90 cm MITRAL VALVE               TRICUSPID VALVE MV Area (PHT): 3.20 cm    TR Peak grad:   26.8 mmHg MV Decel Time: 237 msec    TR Vmax:        259.00 cm/s MV E velocity: 84.00 cm/s MV A velocity: 92.10 cm/s  SHUNTS MV E/A ratio:  0.91        Systemic VTI:  0.28 m                            Systemic Diam: 1.80 cm Dalton  McleanMD Electronically signed by Franki Monte Signature Date/Time: 02/18/2022/3:16:58 PM    Final    (Echo, Carotid, EGD, Colonoscopy, ERCP)    Subjective: Patient seen and examined.  No overnight events.  No more weakness.  Walked around with therapies with no deficits.  Son at the bedside. Patient was wondering whether she should do her CT scans that were scheduled for 3/8.  Patient just underwent CT angiogram of the head and neck, further contrast studies were recommended to wait until tomorrow.  Patient willing to keep up with her appointments on 3/8.    Discharge Exam: Vitals:   02/18/22 0806 02/18/22 1137  BP: (!) 177/88 (!) 156/87  Pulse: 61 63  Resp: 18 18  Temp: 98 F (36.7 C) 97.6 F (36.4 C)  SpO2: 100% 100%   Vitals:   02/17/22 2331 02/18/22 0345 02/18/22 0806 02/18/22 1137  BP: (!) 161/83 (!) 167/88 (!) 177/88 (!) 156/87  Pulse: 71 69 61 63  Resp: '16 16 18 18  '$ Temp: 97.8 F (36.6 C) 97.7 F (36.5 C) 98 F (36.7 C) 97.6 F (36.4 C)  TempSrc: Oral Oral Oral Oral  SpO2: 95% 96% 100% 100%  Weight:      Height:        General: Pt is alert, awake, not in acute distress Does not have any obvious neurological deficits. Cardiovascular: RRR, S1/S2 +, no rubs, no gallops Respiratory: CTA bilaterally, no wheezing, no rhonchi Abdominal: Soft, NT, ND, bowel sounds + Extremities: no edema, no cyanosis    The results of significant diagnostics from this hospitalization (including imaging, microbiology, ancillary and laboratory) are listed below for reference.     Microbiology: Recent Results (from the past 240 hour(s))  Resp Panel by RT-PCR (Flu A&B, Covid) Nasopharyngeal Swab     Status: None   Collection Time: 02/17/22  2:31 PM   Specimen: Nasopharyngeal Swab; Nasopharyngeal(NP) swabs in vial transport medium  Result Value Ref Range Status   SARS Coronavirus 2 by RT PCR NEGATIVE NEGATIVE Final  Comment: (NOTE) SARS-CoV-2 target nucleic acids are NOT  DETECTED.  The SARS-CoV-2 RNA is generally detectable in upper respiratory specimens during the acute phase of infection. The lowest concentration of SARS-CoV-2 viral copies this assay can detect is 138 copies/mL. A negative result does not preclude SARS-Cov-2 infection and should not be used as the sole basis for treatment or other patient management decisions. A negative result may occur with  improper specimen collection/handling, submission of specimen other than nasopharyngeal swab, presence of viral mutation(s) within the areas targeted by this assay, and inadequate number of viral copies(<138 copies/mL). A negative result must be combined with clinical observations, patient history, and epidemiological information. The expected result is Negative.  Fact Sheet for Patients:  EntrepreneurPulse.com.au  Fact Sheet for Healthcare Providers:  IncredibleEmployment.be  This test is no t yet approved or cleared by the [place] FDA and  has been authorized for detection and/or diagnosis of SARS-CoV-2 by FDA under an Emergency Use Authorization (EUA). This EUA will remain  in effect (meaning this test can be used) for the duration of the COVID-19 declaration under Section 564(b)(1) of the Act, 21 U.S.C.section 360bbb-3(b)(1), unless the authorization is terminated  or revoked sooner.       Influenza A by PCR NEGATIVE NEGATIVE Final   Influenza B by PCR NEGATIVE NEGATIVE Final    Comment: (NOTE) The Xpert Xpress SARS-CoV-2/FLU/RSV plus assay is intended as an aid in the diagnosis of influenza from Nasopharyngeal swab specimens and should not be used as a sole basis for treatment. Nasal washings and aspirates are unacceptable for Xpert Xpress SARS-CoV-2/FLU/RSV testing.  Fact Sheet for Patients: EntrepreneurPulse.com.au  Fact Sheet for Healthcare Providers: IncredibleEmployment.be  This test is not yet  approved or cleared by the [place] FDA and has been authorized for detection and/or diagnosis of SARS-CoV-2 by FDA under an Emergency Use Authorization (EUA). This EUA will remain in effect (meaning this test can be used) for the duration of the COVID-19 declaration under Section 564(b)(1) of the Act, 21 U.S.C. section 360bbb-3(b)(1), unless the authorization is terminated or revoked.  Performed at [facility], [place] [address] [place], [place] [address]      Labs: BNP (last 3 results) No results for input(s): BNP in the last 8760 hours. Basic Metabolic Panel: Recent Labs  Lab 02/17/22 1445 02/18/22 0340  NA 137 138  K 3.5 3.7  CL 100 102  CO2 27 25  GLUCOSE 141* 153*  BUN 18 19  CREATININE 0.89 1.06*  CALCIUM 8.9 8.9   Liver Function Tests: Recent Labs  Lab 02/17/22 1445 02/18/22 0340  AST 23 20  ALT 24 22  ALKPHOS 107 90  BILITOT 0.5 0.4  PROT 8.0 6.9  ALBUMIN 3.9 3.3*   No results for input(s): LIPASE, AMYLASE in the last 168 hours. No results for input(s): AMMONIA in the last 168 hours. CBC: Recent Labs  Lab 02/17/22 1445 02/18/22 0340  WBC 7.0 7.4  NEUTROABS 5.4  --   HGB 12.0 11.3*  HCT 34.7* 31.7*  MCV 97.5 96.4  PLT 214 188   Cardiac Enzymes: No results for input(s): CKTOTAL, CKMB, CKMBINDEX, TROPONINI in the last 168 hours. BNP: Invalid input(s): POCBNP CBG: No results for input(s): GLUCAP in the last 168 hours. D-Dimer No results for input(s): DDIMER in the last 72 hours. Hgb A1c Recent Labs    02/18/22 0340  HGBA1C 4.8   Lipid Profile Recent Labs    02/18/22 0340  CHOL 273*  HDL  32*  LDLCALC 185*  TRIG 282*  CHOLHDL 8.5   Thyroid function studies No results for input(s): TSH, T4TOTAL, T3FREE, THYROIDAB in the last 72 hours.  Invalid input(s): FREET3 Anemia work up No results for input(s): VITAMINB12, FOLATE, FERRITIN, TIBC, IRON, RETICCTPCT in the last 72 hours. Urinalysis    Component Value  Date/Time   COLORURINE YELLOW 02/17/2022 1545   APPEARANCEUR CLEAR 02/17/2022 1545   LABSPEC 1.015 02/17/2022 1545   PHURINE 6.0 02/17/2022 1545   GLUCOSEU NEGATIVE 02/17/2022 1545   HGBUR NEGATIVE 02/17/2022 1545   BILIRUBINUR NEGATIVE 02/17/2022 1545   BILIRUBINUR n 06/27/2015 1107   KETONESUR NEGATIVE 02/17/2022 1545   PROTEINUR NEGATIVE 02/17/2022 1545   UROBILINOGEN 0.2 06/27/2015 1107   UROBILINOGEN 0.2 02/08/2014 0920   NITRITE NEGATIVE 02/17/2022 1545   LEUKOCYTESUR SMALL (A) 02/17/2022 1545   Sepsis Labs Invalid input(s): PROCALCITONIN,  WBC,  LACTICIDVEN Microbiology Recent Results (from the past 240 hour(s))  Resp Panel by RT-PCR (Flu A&B, Covid) Nasopharyngeal Swab     Status: None   Collection Time: 02/17/22  2:31 PM   Specimen: Nasopharyngeal Swab; Nasopharyngeal(NP) swabs in vial transport medium  Result Value Ref Range Status   SARS Coronavirus 2 by RT PCR NEGATIVE NEGATIVE Final    Comment: (NOTE) SARS-CoV-2 target nucleic acids are NOT DETECTED.  The SARS-CoV-2 RNA is generally detectable in upper respiratory specimens during the acute phase of infection. The lowest concentration of SARS-CoV-2 viral copies this assay can detect is 138 copies/mL. A negative result does not preclude SARS-Cov-2 infection and should not be used as the sole basis for treatment or other patient management decisions. A negative result may occur with  improper specimen collection/handling, submission of specimen other than nasopharyngeal swab, presence of viral mutation(s) within the areas targeted by this assay, and inadequate number of viral copies(<138 copies/mL). A negative result must be combined with clinical observations, patient history, and epidemiological information. The expected result is Negative.  Fact Sheet for Patients:  EntrepreneurPulse.com.au  Fact Sheet for Healthcare Providers:  IncredibleEmployment.be  This test is no t  yet approved or cleared by the [place] FDA and  has been authorized for detection and/or diagnosis of SARS-CoV-2 by FDA under an Emergency Use Authorization (EUA). This EUA will remain  in effect (meaning this test can be used) for the duration of the COVID-19 declaration under Section 564(b)(1) of the Act, 21 U.S.C.section 360bbb-3(b)(1), unless the authorization is terminated  or revoked sooner.       Influenza A by PCR NEGATIVE NEGATIVE Final   Influenza B by PCR NEGATIVE NEGATIVE Final    Comment: (NOTE) The Xpert Xpress SARS-CoV-2/FLU/RSV plus assay is intended as an aid in the diagnosis of influenza from Nasopharyngeal swab specimens and should not be used as a sole basis for treatment. Nasal washings and aspirates are unacceptable for Xpert Xpress SARS-CoV-2/FLU/RSV testing.  Fact Sheet for Patients: EntrepreneurPulse.com.au  Fact Sheet for Healthcare Providers: IncredibleEmployment.be  This test is not yet approved or cleared by the [place] FDA and has been authorized for detection and/or diagnosis of SARS-CoV-2 by FDA under an Emergency Use Authorization (EUA). This EUA will remain in effect (meaning this test can be used) for the duration of the COVID-19 declaration under Section 564(b)(1) of the Act, 21 U.S.C. section 360bbb-3(b)(1), unless the authorization is terminated or revoked.  Performed at [facility], [place] [address] [place], [place] [address]      Time coordinating discharge:  28 minutes  SIGNED:  Barb Merino, MD  [facility] 02/18/2022, 4:10 PM

## 2022-02-18 NOTE — Progress Notes (Addendum)
STROKE TEAM PROGRESS NOTE   ATTENDING NOTE: I reviewed above note and agree with the assessment and plan. Pt was seen and examined.   66 year old female with history of rectal cancer status post surgery and radiation in 2005, sarcoidosis in remission, hypertension, hyperlipidemia, endometrial cancer status post TAH and BSO in 2019 currently on lifelong chemotherapy admitted for transient right facial and hand numbness with slurred speech.  Currently symptoms all resolved.  CT no acute abnormality.  MRI showed left CR and left frontal punctate infarcts, embolic pattern.  CTA head and neck no significance vascular finding but upper chest increased size/bulk of bandlike opacity, concerning for malignancy.  Pan CT scheduled on 02/20/2022 as outpatient.  LE venous Doppler pending, EF 60 to 65%.  LDL 185, A1c 4.8, UDS negative.  Creatinine 1.06.  On exam, patient does have left nasolabial fold flattening but seem to be chronic per family.  Otherwise neuro intact.  Etiology for patient stroke not quite clear, concerning for cardioembolic source versus hypercoagulable state from advanced cancer versus chemotherapy related.  Recommend 30-day cardiac event monitoring as outpatient.  Recommend pan CT to evaluate for metastasis.  Discussed with Dr. Alvy Bimler heme-onc, will hold off chemotherapy continue outpatient follow-up 02/25/2022.  Continue DAPT for 3 weeks and then aspirin alone.  Continue statin.  Follow-up on LE venous Doppler.  PT/OT no recommendation.  For detailed assessment and plan, please refer to above as I have made changes wherever appropriate.   Neurology will sign off. Please call with questions. Pt will follow up with stroke clinic Dr. Leonie Man at [facility] in about 4 weeks. Thanks for the consult.   Rosalin Hawking, MD PhD Stroke Neurology 02/18/2022 6:27 PM    INTERVAL HISTORY Her son is at the bedside. Patient seen with attending was resting in bed comfortably, awake and alert. Reported feeling back to  baseline. L-sided facial droop noted, but symmetrical on movement when smiling. Per son, he does not notice a difference. Patient and son has no other concerns and are amendable to continued imaging with DVT studies, ECHO, possible cardiac monitoring after d/c, and med changes per below.   Vitals:   02/17/22 2331 02/18/22 0345 02/18/22 0806 02/18/22 1137  BP: (!) 161/83 (!) 167/88 (!) 177/88 (!) 156/87  Pulse: 71 69 61 63  Resp: '16 16 18 18  '$ Temp: 97.8 F (36.6 C) 97.7 F (36.5 C) 98 F (36.7 C) 97.6 F (36.4 C)  TempSrc: Oral Oral Oral Oral  SpO2: 95% 96% 100% 100%  Weight:      Height:       CBC:  Recent Labs  Lab 02/17/22 1445 02/18/22 0340  WBC 7.0 7.4  NEUTROABS 5.4  --   HGB 12.0 11.3*  HCT 34.7* 31.7*  MCV 97.5 96.4  PLT 214 734   Basic Metabolic Panel:  Recent Labs  Lab 02/17/22 1445 02/18/22 0340  NA 137 138  K 3.5 3.7  CL 100 102  CO2 27 25  GLUCOSE 141* 153*  BUN 18 19  CREATININE 0.89 1.06*  CALCIUM 8.9 8.9   Lipid Panel:  Recent Labs  Lab 02/18/22 0340  CHOL 273*  TRIG 282*  HDL 32*  CHOLHDL 8.5  VLDL 56*  LDLCALC 185*    HgbA1c:  Recent Labs  Lab 02/18/22 0340  HGBA1C 4.8    Urine Drug Screen:  Recent Labs  Lab 02/17/22 1545  LABOPIA NONE DETECTED  COCAINSCRNUR NONE DETECTED  LABBENZ NONE DETECTED  AMPHETMU NONE DETECTED  THCU NONE  DETECTED  LABBARB NONE DETECTED    Alcohol Level  Recent Labs  Lab 02/17/22 1445  ETH <10    IMAGING past 24 hours CT ANGIO HEAD NECK W WO CM  Result Date: 02/18/2022 CLINICAL DATA:  Stroke, follow up EXAM: CT ANGIOGRAPHY HEAD AND NECK TECHNIQUE: Multidetector CT imaging of the head and neck was performed using the standard protocol during bolus administration of intravenous contrast. Multiplanar CT image reconstructions and MIPs were obtained to evaluate the vascular anatomy. Carotid stenosis measurements (when applicable) are obtained utilizing NASCET criteria, using the distal internal carotid  diameter as the denominator. RADIATION DOSE REDUCTION: This exam was performed according to the departmental dose-optimization program which includes automated exposure control, adjustment of the mA and/or kV according to patient size and/or use of iterative reconstruction technique. CONTRAST:  64m OMNIPAQUE IOHEXOL 350 MG/ML SOLN COMPARISON:  CT and MRI February 17, 2022. FINDINGS: CT HEAD FINDINGS Brain: Subcentimeter infarcts seen on recent MRI is not visible by CT. No evidence of acute large vascular territory infarct, acute hemorrhage, mass lesion, midline shift, or hydrocephalus. Vascular: See below. Skull: No acute fracture. Sinuses: Kozel thickening of the visualized maxillary sinuses and scattered ethmoid air cells. Frothy secretions in the left sphenoid sinus. Orbits: No acute finding. Review of the MIP images confirms the above findings CTA NECK FINDINGS Aortic arch: Great vessel origins are patent with mild atherosclerotic narrowing. Right carotid system: Mixed calcific and noncalcific atherosclerosis at the carotid bifurcation without greater than 50% stenosis. Left carotid system: Mixed calcific and noncalcific atherosclerosis involving the common carotid artery and carotid bifurcation without greater than 50% stenosis. Vertebral arteries: Right dominant. No evidence of significant (greater than 50%) stenosis. Mild narrowing of the vertebral artery origins bilaterally due to atherosclerosis. Skeleton: No evidence of acute abnormality. Other neck: No evidence of acute abnormality. Upper chest: Increased size/bulk of the band like opacity previously seen on 11/21/2021 CT chest, which is partially imaged on this study. There is suggestion that this area may be spiculated. Review of the MIP images confirms the above findings CTA HEAD FINDINGS Anterior circulation: Mild narrowing of bilateral intracranial ICAs due to calcific atherosclerosis. Bilateral MCAs and ACAs are patent without proximal hemodynamically  significant stenosis. Approximately 1-2 mm inferiorly directed outpouching arising from the right supraclinoid ICA (series 12, image 70), which could represent an infundibulum with vessel not well seen or small aneurysm. Posterior circulation: Right dominant intradural vertebral artery. Bilateral intradural vertebral arteries, basilar artery, and posterior cerebral arteries are patent without significant proximal stenosis. Mild multifocal narrowing of the PCAs bilaterally. Venous sinuses: As permitted by contrast timing, patent. Anatomic variants: See above. Review of the MIP images confirms the above findings IMPRESSION: CTA head: 1. No large vessel occlusion. 2. Multiple sites of mild atherosclerotic narrowing without proximal hemodynamically significant stenosis. 3. Approximately 1-2 mm inferiorly directed outpouching arising from the right supraclinoid ICA, which could represent an infundibulum with vessel not well seen or small aneurysm. CTA neck: 1. No significant (greater than 50%) stenosis. 2. Bilateral carotid bifurcation atherosclerosis. 3. Increased size/bulk of the band like opacity previously seen on 11/21/2021 CT chest, which is partially imaged on this study. This could potentially be infectious/inflammatory; however, given the interval increase in size and the potentially spiculated appearance, malignancy is not excluded. Recommend chest CT to fully evaluate and to establish follow-up recommendations. These results will be called to the ordering clinician or representative by the Radiologist Assistant, and communication documented in the PACS or [employer] Electronically Signed  By: Margaretha Sheffield M.D.   On: 02/18/2022 09:35   CT HEAD WO CONTRAST  Result Date: 02/17/2022 CLINICAL DATA:  Acute neurologic deficit. EXAM: CT HEAD WITHOUT CONTRAST TECHNIQUE: Contiguous axial images were obtained from the base of the skull through the vertex without intravenous contrast. RADIATION DOSE  REDUCTION: This exam was performed according to the departmental dose-optimization program which includes automated exposure control, adjustment of the mA and/or kV according to patient size and/or use of iterative reconstruction technique. COMPARISON:  04/14/2010 FINDINGS: Brain: No evidence of acute infarction, hemorrhage, hydrocephalus, extra-axial collection or mass lesion/mass effect. Vascular: No hyperdense vessel or unexpected calcification. Skull: Normal. Negative for fracture or focal lesion. Sinuses/Orbits: Mucosal thickening is noted involving the ethmoid air cells and left maxillary sinus. Other: None IMPRESSION: 1. No acute intracranial abnormalities. 2. Chronic sinus inflammation. Electronically Signed   By: Kerby Moors M.D.   On: 02/17/2022 14:57   MR BRAIN WO CONTRAST  Result Date: 02/17/2022 CLINICAL DATA:  Neuro deficit, acute, stroke suspected. Right-sided facial numbness and left-sided facial droop. EXAM: MRI HEAD WITHOUT CONTRAST TECHNIQUE: Multiplanar, multiecho pulse sequences of the brain and surrounding structures were obtained without intravenous contrast. COMPARISON:  Head CT 02/17/2022. FINDINGS: Brain: Cerebral volume appears normal for age. Subcentimeter acute infarct within the left corona radiata/caudate nucleus (series 6, image 29). Additional punctate acute infarct within the mid left frontal lobe (series 5, image 85). Mild multifocal T2 FLAIR hyperintense signal abnormality within the cerebral white matter and pons, nonspecific but compatible with chronic small vessel ischemic disease. No evidence of an intracranial mass. No chronic intracranial blood products. No extra-axial fluid collection. No midline shift. Vascular: Maintained flow voids within the proximal large arterial vessels. Skull and upper cervical spine: No focal suspicious marrow lesion. Sinuses/Orbits: Visualized orbits show no acute finding. Mild mucosal thickening within the left frontal sinus.  Mild-to-moderate mucosal thickening and scattered fluid within the bilateral ethmoid air cells. Mucous retention cyst within a posterior right ethmoid air cell. Small volume frothy secretions within the left sphenoid sinus. Mild-to-moderate mucosal thickening within the bilateral maxillary sinuses. Other: Trace fluid within the right mastoid air cells. 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 Additional punctate acute infarct within the mid left frontal lobe. Mild chronic small vessel ischemic changes within the cerebral white matter and pons. Paranasal sinus disease, as described. Trace fluid within the right mastoid air cells. Electronically Signed   By: Kellie Simmering D.O.   On: 02/17/2022 17:10    PHYSICAL EXAM Constitutional: Appears well-developed and well-nourished.  Psych: Affect appropriate to situation Eyes: No scleral injection Respiratory: Effort normal, non-labored breathing  Neuro: Mental Status: Patient is awake, alert, oriented to person, place, month, year, and situation. Patient is able to give a clear and coherent history. No signs of aphasia or neglect Cranial Nerves: II: Visual Fields are full. Pupils are equal, round, and reactive to light.   III,IV, VI: EOMI without ptosis or diploplia.  V: Facial sensation is symmetric to temperature VII: Right side facial droop, but facial movement is symmetric when smiling. (Per son at bedside, facial droop is chronic) VIII: Hearing is intact to voice XI: Shoulder shrug is symmetric XII: Tongue protrudes midline without atrophy or fasciculations Motor: Tone is normal. Bulk is normal. 5/5 strength was present in bilateral UE and RLE. Chronic LLE 4/5 strength (due to cancer) Sensory: Sensation is symmetric to light touch and temperature in the arms and legs. No extinction to DSS present.  Cerebellar:  FNF are intact bilaterally  ASSESSMENT/PLAN [name] is a 66 y.o. female with  history of rectal cancer s/p resection and XRT in 2005, sarcoidosis followed with Dr Melvyn Novas, stable ,not on any medicatin, h/o HTN, HLD was on statin in the past, h/o endometrial cancer s/p TAH BSO in 2019, with intrapelvic lymph nodes, getting chemo sicne 05/2020 to 06/2021 then started lenvatinib/pembrolizumab q21d since 08/2021, last  dose pembrolizumab on 2/21 presenting with right side facial numbness and right hand numbness for 15mns and slurred speech that has resolved. MRI showed infarct x2 in MCA distribution consistent with embolic pattern, likely 2/2 malignancy and chemotherapy drug causing hypercoagulability.  Started atorvastatin. Discussed with outpatient oncologist about future plans, no biopsies planned, started ASA+plavix, and holding home lenvatinib per outpatient oncologist until next appointment (02/25/2022). Concern for malignancy, pan-CT on 02/20/2022 outpatient.   Stroke: Left MCA small/punctate infarct x2 likely, embolic pattern source unclear, cardio emobolic vs. Hypercoagulable state from advanced cancer Code Stroke CT head: No acute abnormality. Chronic sinus inflammation  CTA head: ~1-265minferiorly directed outpouching arising from the R ICA -  infundibulum vs small aneurysm CTA neck: Bilateral Carotid bifurcation atherosclerosis.   MRI: Subcentimeter acute infarct within the left corona radiata/caudate nucleus. Additional punctate acute infarct within the mid left frontal lobe 2D Echo EF 60 to 65% DVT ultrasound - scheduled for 02/18/2022 LDL 185  HgbA1c 4.8  VTE prophylaxis - Enoxaparin (lovenox) No antithrombotic prior to admission, now on aspirin 81 mg daily and clopidogrel 75 mg daily DAPT for 3 weeks and then aspirin. Considering 30 day Holter monitor Therapy recommendations: No Pt/OT follow-up Disposition:  Home with rolling walker  Hypertension Home meds: Bisoprolol 10 mg BID, HCTZ 25 mg daily, HCTZ was continued Stable Long-term BP goal  normotensive  Hyperlipidemia Home meds:  N/A LDL 185, goal < 70 Add Atorvastatin 80 mg daily High intensity statin  Continue statin at discharge  Endometrial cancer (dx'd 06/2018), s/p TAH BSO 2019:  Hold home lenvatinib/pembrolizumab until next outpatient hem/onc appointment (02/25/2022) per outpatient hem/onc Dr. GoAlvy BimlerTA neck: Increased size/bulk of the band like opacity previously seen on 11/21/2021 CT chest, concerning for malignancy Outpatient CT chest, abd, pelvis 02/20/2022   Other Stroke Risk Factors Obesity, Body mass index is 32.72 kg/m., BMI >/= 30 associated with increased stroke risk, recommend weight loss, diet and exercise as appropriate  Coronary artery disease  On chemotherapy-Lenvatinib Endometrial cancer (dx'd 06/2018), Squamous cell cancer, anus (dx 04/ 2005)  Other Active Problems Sarcoidosis, stable: Dulera BID Acquired hypothyroidism: Synthroid 100 mcg Facial rash/cellulitis: Prednisone taper and doxycycline 100 mg BID  Hospital day # 0  Signed: JuMerrily BrittleDO Resident, PGY-1 [place]/05/2022, 12:40 PM   To contact Stroke Continuity provider, please refer to [email or web address] hours, contact General Neurology

## 2022-02-18 NOTE — TOC Transition Note (Signed)
Transition of Care (TOC) - CM/SW Discharge Note ? ? ?Patient Details  ?Name: [name] ?MRN: [id number] ?Date of Birth: [birth year] ? ?Transition of Care (TOC) CM/SW Contact:  ?Pollie Friar, RN ?Phone Number: ?02/18/2022, 11:23 AM ? ? ?Clinical Narrative:    ?Patient will discharge home once medically ready. No f/u per PT.  ?Pt with orders for walker for home. [employer] will deliver the DME to the patients room.  ?Pt denies any issues with transportation or home medications. Pt's daughter lives with her but works during the daytime until 5 pm.  ?Pt has transport home once discharged.  ? ? ?Final next level of care: Home/Self Care ?Barriers to Discharge: No Barriers Identified ? ? ?Patient Goals and CMS Choice ?  ?CMS Medicare.gov Compare Post Acute Care list provided to:: Patient ?Choice offered to / list presented to : Patient ? ?Discharge Placement ?  ?           ?  ?  ?  ?  ? ?Discharge Plan and Services ?  ?  ?           ?DME Arranged: Walker rolling ?DME Agency: [facility] ?Date DME Agency Contacted: 02/18/22 ?  ?Representative spoke with at DME Agency: Freda Munro ?  ?  ?  ?  ?  ? ?Social Determinants of Health (SDOH) Interventions ?  ? ? ?Readmission Risk Interventions ?No flowsheet data found. ? ? ? ? ?

## 2022-02-18 NOTE — Evaluation (Signed)
Occupational Therapy Evaluation ?Patient Details ?Name: [name] ?MRN: [id number] ?DOB: 1956/06/12 ?Today's Date: 02/18/2022 ? ? ?History of Present Illness Pt is a 66yo female brought to ED with R UE and facial numbness, which subsided prior to arrival to ED. 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 PMH: endometrial ca with lifelong chemo. rash from chemo t/o face  ? ?Clinical Impression ?  ?Telesia was evaluated s/p the above admission list, she is generally mod I at baseline including driving and completing all ADLs. She lives in a 1 level home with her daughter who works during the day. Upon evaluation pt reports that her R side symptoms have resolved. Overall pt was set up - min guard for all mobility and ADLs with RW. Pt is deliberate and cautious with all of her movements, and is limited by generalized weakness and baseline L hip pain. OT to continue to follow acutely to progress limitations listed below. Recommend d/c to home without follow up OT needs.  ?   ? ?Recommendations for follow up therapy are one component of a multi-disciplinary discharge planning process, led by the attending physician.  Recommendations may be updated based on patient status, additional functional criteria and insurance authorization.  ? ?Follow Up Recommendations ? No OT follow up  ?  ?Assistance Recommended at Discharge Intermittent Supervision/Assistance  ?Patient can return home with the following A little help with bathing/dressing/bathroom;Assistance with cooking/housework;Assist for transportation;Help with stairs or ramp for entrance ? ?  ?Functional Status Assessment ? Patient has had a recent decline in their functional status and demonstrates the ability to make significant improvements in function in a reasonable and predictable amount of time.  ?Equipment Recommendations ? Other (comment) (RW)  ?  ?   ?Precautions / Restrictions Precautions ?Precautions:  Fall ?Precaution Comments: recent fall in dec ?Restrictions ?Weight Bearing Restrictions: No (Simultaneous filing. User may not have seen previous data.)  ? ?  ? ?Mobility Bed Mobility ?Overal bed mobility: Modified Independent ?  ?  ?  ?  ?  ?  ?General bed mobility comments: HOB elevated, has adjustable bed at home ?  ? ?Transfers ?Overall transfer level: Needs assistance ?Equipment used: Rolling walker (2 wheels) ?Transfers: Sit to/from Stand ?Sit to Stand: Min guard ?  ?  ?  ?  ?  ?General transfer comment: min guard to power up, pt slow and guarded, this is baseline ?  ? ?  ?Balance Overall balance assessment: Needs assistance ?Sitting-balance support: No upper extremity supported, Feet supported ?Sitting balance-Leahy Scale: Good ?  ?  ?Standing balance support: Bilateral upper extremity supported, During functional activity ?  ?Standing balance comment: pt benefits from UE support when ambulating, can statically stand for bimanual UE ADLs ?  ?  ?  ?  ?  ?  ?  ?  ?  ?  ?  ?   ? ?ADL either performed or assessed with clinical judgement  ? ?ADL Overall ADL's : Needs assistance/impaired ?Eating/Feeding: Independent;Sitting ?  ?Grooming: Supervision/safety;Standing ?  ?Upper Body Bathing: Set up;Sitting ?  ?Lower Body Bathing: Min guard;Sit to/from stand ?  ?Upper Body Dressing : Set up;Sitting ?  ?Lower Body Dressing: Min guard;Sit to/from stand ?  ?Toilet Transfer: Min guard;Ambulation;Rolling walker (2 wheels) ?  ?Toileting- Clothing Manipulation and Hygiene: Supervision/safety;Sitting/lateral lean ?  ?  ?  ?Functional mobility during ADLs: Min guard;Rolling walker (2 wheels) ?General ADL Comments: pt requires incr time fro all tasks, she is very deliberate with  movements, and cautious when ambulating. Overall [facility] for tasks assessed. LImted by generalized weakness, baseline LLE pain and shuffling gait. Benefits from RW  ? ? ? ?Vision Baseline Vision/History: 1 Wears glasses ?Ability to See in Adequate Light: 0  Adequate ?Patient Visual Report: No change from baseline ?Vision Assessment?: No apparent visual deficits ?Additional Comments: assessment WFL - pt said she had some change in her vision at the onset of her symptoms, they have since resolved  ?   ?   ?   ? ?Pertinent Vitals/Pain Pain Assessment ?Pain Assessment: No/denies pain  ? ? ? ?Hand Dominance Right ?  ?Extremity/Trunk Assessment Upper Extremity Assessment ?Upper Extremity Assessment: Generalized weakness;RUE deficits/detail ?RUE Deficits / Details: R grip slightly weaker than L but WFL ?RUE Sensation: decreased light touch;history of peripheral neuropathy ?RUE Coordination: decreased fine motor ?  ?Lower Extremity Assessment ?Lower Extremity Assessment: Defer to PT evaluation ?  ?Cervical / Trunk Assessment ?Cervical / Trunk Assessment: Normal ?  ?Communication Communication ?Communication: No difficulties ?  ?Cognition Arousal/Alertness: Awake/alert ?Behavior During Therapy: [facility] for tasks assessed/performed ?Overall Cognitive Status: Within Functional Limits for tasks assessed ?  ?  ?  ?  ?  ?  ?  ?  ?  ?General Comments  VSS on RA, family in the room. healing rash on face - concerned about chemo meds, RN notified ? ?  ? ?Home Living Family/patient expects to be discharged to:: Private residence ?Living Arrangements: Children ?Available Help at Discharge: Family;Available PRN/intermittently ?Type of Home: House ?Home Access: Stairs to enter ?Entrance Stairs-Number of Steps: 1 ?Entrance Stairs-Rails: Can reach both ?Home Layout: One level ?  ?  ?Bathroom Shower/Tub: Walk-in shower ?  ?Bathroom Toilet: Handicapped height ?Bathroom Accessibility: Yes ?  ?Home Equipment: Air cabin crew (4 wheels) ?  ?  ?  ? ?  ?Prior Functioning/Environment Prior Level of Function : Independent/Modified Independent;Driving ?  ?  ?  ?  ?  ?  ?Mobility Comments: uses RW occasionally in community ?ADLs Comments: independent, uses shower seat in shower ?  ? ?  ?  ?OT Problem  List: Decreased strength;Decreased activity tolerance;Decreased range of motion;Impaired balance (sitting and/or standing);Decreased safety awareness;Decreased knowledge of use of DME or AE ?  ?   ?OT Treatment/Interventions: Self-care/ADL training;Therapeutic exercise;DME and/or AE instruction;Therapeutic activities;Patient/family education;Balance training  ?  ?OT Goals(Current goals can be found in the care plan section) Acute Rehab OT Goals ?Patient Stated Goal: home ?OT Goal Formulation: With patient ?Time For Goal Achievement: 03/04/22 ?Potential to Achieve Goals: Good ?ADL Goals ?Pt Will Perform Lower Body Dressing: with modified independence;sit to/from stand ?Pt Will Transfer to Toilet: with modified independence;ambulating ?Pt/caregiver will Perform Home Exercise Program: Increased strength;Both right and left upper extremity;With written HEP provided ?Additional ADL Goal #1: Pt will indep recall at least 3 fall prevention strategies to apply to the home setting  ?OT Frequency: Min 2X/week ?  ? ?   ?AM-PAC OT "6 Clicks" Daily Activity     ?Outcome Measure Help from another person eating meals?: None ?Help from another person taking care of personal grooming?: A Little ?Help from another person toileting, which includes using toliet, bedpan, or urinal?: A Little ?Help from another person bathing (including washing, rinsing, drying)?: A Little ?Help from another person to put on and taking off regular upper body clothing?: None ?Help from another person to put on and taking off regular lower body clothing?: A Little ?6 Click Score: 20 ?  ?End of Session Equipment Utilized During Treatment:  Rolling walker (2 wheels) ?Nurse Communication: Mobility status ? ?Activity Tolerance: Patient tolerated treatment well ?Patient left: in bed;with call bell/phone within reach;with family/visitor present ? ?OT Visit Diagnosis: Unsteadiness on feet (R26.81);Other abnormalities of gait and mobility (R26.89);Muscle weakness  (generalized) (M62.81)  ?              ?Time: 9741-6384 ?OT Time Calculation (min): 17 min ?Charges:  OT Evaluation ?$OT Eval Moderate Complexity: 1 Mod ? ? ?Araiyah Cumpton A Kanoa Phillippi ?02/18/2022, 9:50 AM ?

## 2022-02-18 NOTE — Care Management Obs Status (Signed)
MEDICARE OBSERVATION STATUS NOTIFICATION ? ? ?Patient Details  ?Name: [name] ?MRN: [id number] ?Date of Birth: Oct 30, 1956 ? ? ?Medicare Observation Status Notification Given:  Yes ? ? ? ?Pollie Friar, RN ?02/18/2022, 11:22 AM ?

## 2022-02-19 ENCOUNTER — Telehealth: Payer: Self-pay

## 2022-02-19 NOTE — Telephone Encounter (Signed)
Returned her call. Reviewed d/c medication list with her. She will resume medications. She will resume prednisone med pack. Reminded her to do not resume [place]. ?Reviewed upcoming appts. She verbalized understanding. ?

## 2022-02-20 ENCOUNTER — Other Ambulatory Visit: Payer: Self-pay | Admitting: Hematology and Oncology

## 2022-02-20 ENCOUNTER — Ambulatory Visit (HOSPITAL_COMMUNITY)
Admission: RE | Admit: 2022-02-20 | Discharge: 2022-02-20 | Disposition: A | Discharge status: Home / Self Care | Payer: Medicare HMO | Source: Ambulatory Visit | Attending: Hematology and Oncology | Admitting: Hematology and Oncology

## 2022-02-20 ENCOUNTER — Other Ambulatory Visit: Payer: Self-pay

## 2022-02-20 ENCOUNTER — Inpatient Hospital Stay: Payer: Medicare HMO | Attending: Hematology and Oncology

## 2022-02-20 ENCOUNTER — Telehealth: Payer: Self-pay | Admitting: Oncology

## 2022-02-20 ENCOUNTER — Encounter (HOSPITAL_COMMUNITY): Payer: Self-pay

## 2022-02-20 ENCOUNTER — Telehealth: Payer: Self-pay | Admitting: *Deleted

## 2022-02-20 DIAGNOSIS — K449 Diaphragmatic hernia without obstruction or gangrene: Secondary | ICD-10-CM | POA: Diagnosis not present

## 2022-02-20 DIAGNOSIS — C775 Secondary and unspecified malignant neoplasm of intrapelvic lymph nodes: Secondary | ICD-10-CM | POA: Insufficient documentation

## 2022-02-20 DIAGNOSIS — Z5111 Encounter for antineoplastic chemotherapy: Secondary | ICD-10-CM | POA: Insufficient documentation

## 2022-02-20 DIAGNOSIS — C7802 Secondary malignant neoplasm of left lung: Secondary | ICD-10-CM | POA: Insufficient documentation

## 2022-02-20 DIAGNOSIS — L03211 Cellulitis of face: Secondary | ICD-10-CM | POA: Insufficient documentation

## 2022-02-20 DIAGNOSIS — C541 Malignant neoplasm of endometrium: Secondary | ICD-10-CM | POA: Diagnosis not present

## 2022-02-20 DIAGNOSIS — I700 Atherosclerosis of aorta: Secondary | ICD-10-CM | POA: Diagnosis not present

## 2022-02-20 DIAGNOSIS — M160 Bilateral primary osteoarthritis of hip: Secondary | ICD-10-CM | POA: Diagnosis not present

## 2022-02-20 DIAGNOSIS — I898 Other specified noninfective disorders of lymphatic vessels and lymph nodes: Secondary | ICD-10-CM | POA: Diagnosis not present

## 2022-02-20 MED ORDER — HEPARIN SOD (PORK) LOCK FLUSH 100 UNIT/ML IV SOLN
INTRAVENOUS | Status: AC
  Administered 2022-02-20: 500 [IU]
  Filled 2022-02-20: qty 5

## 2022-02-20 MED ORDER — IOHEXOL 300 MG/ML  SOLN
100.0000 mL | Freq: Once | INTRAMUSCULAR | Status: AC | PRN
  Administered 2022-02-20: 100 mL via INTRAVENOUS

## 2022-02-20 MED ORDER — HEPARIN SOD (PORK) LOCK FLUSH 100 UNIT/ML IV SOLN
500.0000 [IU] | Freq: Once | INTRAVENOUS | Status: DC
Start: 2022-02-20 — End: 2022-02-21

## 2022-02-20 MED ORDER — SODIUM CHLORIDE (PF) 0.9 % IJ SOLN
INTRAMUSCULAR | Status: AC
  Filled 2022-02-20: qty 50

## 2022-02-20 NOTE — Telephone Encounter (Signed)
GOt it ?

## 2022-02-20 NOTE — Telephone Encounter (Signed)
This RN was contacted by radiology with called report on CT of [facility] with noted abnormalities.  ?Noted as viewable in [facility]. ? ?Pt is scheduled for follow up with Dr Alvy Bimler on 02/27/2022. ? ?This note will be forwarded to [facility] for her review of CT results. ?

## 2022-02-20 NOTE — Telephone Encounter (Signed)
[name] and rescheduled her appointment with Dr. Alvy Bimler to 02/22/22 at 12:00.  Advised her to arrive to the [facility] at 11:30 and to bring her son or sister with her.  She verbalized understanding and agreement. ?

## 2022-02-21 ENCOUNTER — Other Ambulatory Visit: Payer: Self-pay | Admitting: Hematology and Oncology

## 2022-02-22 ENCOUNTER — Encounter: Payer: Self-pay | Admitting: Hematology and Oncology

## 2022-02-22 ENCOUNTER — Inpatient Hospital Stay (HOSPITAL_BASED_OUTPATIENT_CLINIC_OR_DEPARTMENT_OTHER): Payer: Medicare HMO | Admitting: Hematology and Oncology

## 2022-02-22 ENCOUNTER — Other Ambulatory Visit: Payer: Self-pay

## 2022-02-22 DIAGNOSIS — I10 Essential (primary) hypertension: Secondary | ICD-10-CM

## 2022-02-22 DIAGNOSIS — C775 Secondary and unspecified malignant neoplasm of intrapelvic lymph nodes: Secondary | ICD-10-CM | POA: Diagnosis not present

## 2022-02-22 DIAGNOSIS — C541 Malignant neoplasm of endometrium: Secondary | ICD-10-CM

## 2022-02-22 DIAGNOSIS — Z8673 Personal history of transient ischemic attack (TIA), and cerebral infarction without residual deficits: Secondary | ICD-10-CM | POA: Diagnosis not present

## 2022-02-22 DIAGNOSIS — C7802 Secondary malignant neoplasm of left lung: Secondary | ICD-10-CM | POA: Diagnosis not present

## 2022-02-22 DIAGNOSIS — Z5111 Encounter for antineoplastic chemotherapy: Secondary | ICD-10-CM | POA: Diagnosis not present

## 2022-02-22 DIAGNOSIS — Z7189 Other specified counseling: Secondary | ICD-10-CM | POA: Diagnosis not present

## 2022-02-22 NOTE — Assessment & Plan Note (Signed)
I have reviewed multiple CT imaging with the patient and her sister ?Unfortunately, she has signs of disease progression ?The patient had multiple relapses ?We reviewed the guidelines ?Previously, in July 2022, we will discontinue chemotherapy due to severe pancytopenia, excessive fatigue and poor quality of life ?I anticipate the side effects would be worse if we give her a combination chemotherapy again, namely carboplatin and Taxotere ?Single agent treatment with carboplatin only, Taxotere only or liposomal doxorubicin could be attempted ?We discussed expected benefits from each treatment option ?At the end of the day, she is undecided ?We will call her again next week for further follow-up and plan of care ?

## 2022-02-22 NOTE — Assessment & Plan Note (Signed)
We discussed goals of care ?We discussed expected benefit from further treatment ?We discussed prognosis with or without treatment ?She is undecided ?We will call her next week for follow-up ?She is aware that while treatment may prolong her life, complications from treatment may shorten her life ?

## 2022-02-22 NOTE — Progress Notes (Signed)
[facility] OFFICE PROGRESS NOTE  Patient Care Team: [name], [name], MD as PCP - General (Family Medicine) Awanda Mink Craige Cotta, RN as Oncology Nurse Navigator (Oncology)  ASSESSMENT & PLAN:  Endometrial cancer [facility]) I have reviewed multiple CT imaging with the patient and her sister Unfortunately, she has signs of disease progression The patient had multiple relapses We reviewed the guidelines Previously, in July 2022, we will discontinue chemotherapy due to severe pancytopenia, excessive fatigue and poor quality of life I anticipate the side effects would be worse if we give her a combination chemotherapy again, namely carboplatin and Taxotere Single agent treatment with carboplatin only, Taxotere only or liposomal doxorubicin could be attempted We discussed expected benefits from each treatment option At the end of the day, she is undecided We will call her again next week for further follow-up and plan of care  History of stroke She was diagnosed with stroke recently but have no further neurological deficits She is on antiplatelet agent The cause of her stroke could be due to uncontrolled hypertension, dehydration, hypercoagulable state due to her cancer and side effects of Lenvima She will continue medical management  Essential hypertension She has poorly controlled hypertension We discussed the importance of aggressive management of her blood pressure and close monitoring due to recent history of stroke  Goals of care, counseling/discussion We discussed goals of care We discussed expected benefit from further treatment We discussed prognosis with or without treatment She is undecided We will call her next week for follow-up She is aware that while treatment may prolong her life, complications from treatment may shorten her life  No orders of the defined types were placed in this encounter.   All questions were answered. The patient knows to call the clinic with  any problems, questions or concerns. The total time spent in the appointment was 40 minutes encounter with patients including review of chart and various tests results, discussions about plan of care and coordination of care plan   [name], MD 02/22/2022 1:17 PM  INTERVAL HISTORY: Please see below for problem oriented charting. she returns for treatment follow-up with her sister She was recently hospitalized due to stroke and right-sided deficit but those have resolved She has not been checking her blood pressure at home and asked me whether she should start checking it? She denies abdominal pain or new neurological deficit The patient denies any recent signs or symptoms of bleeding such as spontaneous epistaxis, hematuria or hematochezia.  REVIEW OF SYSTEMS:   Constitutional: Denies fevers, chills or abnormal weight loss Eyes: Denies blurriness of vision Ears, nose, mouth, throat, and face: Denies mucositis or sore throat Respiratory: Denies cough, dyspnea or wheezes Cardiovascular: Denies palpitation, chest discomfort or lower extremity swelling Gastrointestinal:  Denies nausea, heartburn or change in bowel habits Skin: Denies abnormal skin rashes Lymphatics: Denies new lymphadenopathy or easy bruising Neurological:Denies numbness, tingling or new weaknesses Behavioral/Psych: Mood is stable, no new changes  All other systems were reviewed with the patient and are negative.  I have reviewed the past medical history, past surgical history, social history and family history with the patient and they are unchanged from previous note.  ALLERGIES:  is allergic to shellfish allergy and penicillins.  MEDICATIONS:  Current Outpatient Medications  Medication Sig Dispense Refill   acetaminophen (TYLENOL) 500 MG tablet Take 500 mg by mouth every 6 (six) hours as needed for mild pain.     albuterol (PROAIR HFA) 108 (90 Base) MCG/ACT inhaler 2 puffs  every 4 hours as needed only  if your  can't catch your breath (Patient taking differently: Inhale 2 puffs into the lungs every 4 (four) hours as needed for wheezing or shortness of breath.) 18 g 2   aspirin 81 MG EC tablet Take 1 tablet (81 mg total) by mouth daily. Swallow whole. 30 tablet 2   atorvastatin (LIPITOR) 80 MG tablet Take 1 tablet (80 mg total) by mouth daily. 30 tablet 2   bisoprolol (ZEBETA) 10 MG tablet Take 1 tablet (10 mg total) by mouth in the morning and at bedtime. 60 tablet 1   cetirizine (ALLERGY, CETIRIZINE,) 10 MG tablet Take 1 tablet (10 mg total) by mouth daily. 30 tablet 0   clopidogrel (PLAVIX) 75 MG tablet Take 1 tablet (75 mg total) by mouth daily for 21 days. 21 tablet 0   desonide (DESOWEN) 0.05 % lotion Apply topically 2 (two) times daily. Continue following with dermatologist (Patient taking differently: Apply 1 application topically 2 (two) times daily as needed (skin irritation). Continue following with dermatologist) 59 mL 0   diphenhydrAMINE (BENADRYL) 25 MG tablet Take 25 mg by mouth every 6 (six) hours as needed for allergies.     famotidine (PEPCID) 20 MG tablet Take 1 tablet (20 mg total) by mouth daily for 10 days. 30 tablet 0   hydrochlorothiazide (HYDRODIURIL) 25 MG tablet Take 1 tablet (25 mg total) by mouth daily. 30 tablet 3   levothyroxine (SYNTHROID) 100 MCG tablet Take 1 tablet (100 mcg total) by mouth daily before breakfast. 30 tablet 3   lidocaine-prilocaine (EMLA) cream Apply 1 application topically as needed. 30 g 3   loperamide (IMODIUM) 2 MG capsule Take 2 mg by mouth daily as needed for diarrhea or loose stools.     loratadine (CLARITIN) 10 MG tablet Take 10 mg by mouth daily as needed for allergies.     mometasone-formoterol (DULERA) 100-5 MCG/ACT AERO Take 2 puffs first thing in am and then another 2 puffs about 12 hours later. (Patient taking differently: 2 puffs 2 (two) times daily as needed for wheezing or shortness of breath.) 3 each 3   ondansetron (ZOFRAN) 8 MG tablet Take  1 tablet (8 mg total) by mouth every 8 (eight) hours as needed for nausea. 30 tablet 3   predniSONE (DELTASONE) 10 MG tablet Take 6 tablets by mouth daily for 2 days, THEN 5 tablets daily for 2 days, THEN 4 tablets daily for 2 days, THEN 3 tablets daily for 2 days, THEN 2 tablets daily for 2 days, THEN 1 tablet daily for 2 days. 42 tablet 0   predniSONE (STERAPRED UNI-PAK 21 TAB) 10 MG (21) TBPK tablet Take by mouth daily. Take 6 tabs by mouth daily  for 2 days, then 5 tabs for 2 days, then 4 tabs for 2 days, then 3 tabs for 2 days, 2 tabs for 2 days, then 1 tab by mouth daily for 2 days 42 tablet 0   prochlorperazine (COMPAZINE) 10 MG tablet TAKE 1 TABLET BY MOUTH EVERY 6 HOURS AS NEEDED FOR NAUSEA OR VOMITING (Patient taking differently: Take 10 mg by mouth every 6 (six) hours as needed for nausea or vomiting.) 60 tablet 3   triamcinolone (NASACORT) 55 MCG/ACT AERO nasal inhaler Place 2 sprays into each nostril daily. (Patient taking differently: Place 2 sprays into the nose 2 (two) times daily as needed (congestion).) 16.9 mL 12   vitamin C (ASCORBIC ACID) 500 MG tablet Take 500 mg by mouth daily.  No current facility-administered medications for this visit.    SUMMARY OF ONCOLOGIC HISTORY: Oncology History Overview Note  ER/PR neg, MSI stable Progressed on Lenvima and keytruda   Endometrial cancer ([place])  06/15/2018 Surgery   Surgeon: Donaciano Eva     Operation: Robotic-assisted laparoscopic total hysterectomy with bilateral salpingoophorectomy, SLN biopsy     Operative Findings:  : 6cm uterus, normal tubes and ovaries, omentum adherent to the anterior abdominal wall and uterine fundus. No suspicious nodes.   06/15/2018 Pathology Results   Procedure: Total hysterectomy with bilateral salpingo-oophorectomy. Bilateral obturator sentinel lymph node biopsies. Histologic type: Endometrioid adenocarcinoma. Histologic Grade: FIGO grade 1. Myometrial invasion: Depth of invasion: 15  mm Myometrial thickness: 20 mm Uterine Serosa Involvement: Not identified. Cervical stromal involvement: Not identified. Extent of involvement of other organs: Uninvolved. Lymphovascular invasion: Present. Regional Lymph Nodes: Examined: 2 Sentinel 0 Non-sentinel 2 Total Lymph nodes with metastasis: 0 Isolated tumor cells (< 0.2 mm): 2 Micrometastasis: (> 0.2 mm and < 2.0 mm): 0 Macrometastasis: (> 2.0 mm): 0 Extracapsular extension: N/A. Tumor block for ancillary studies: 4Q-S. MMR / MSI testing: Will be ordered. Pathologic Stage Classification (pTNM, AJCC 8th edition): pT1b, pN0(i+)   07/20/2018 Imaging   1. Surgical changes from recent hysterectomy. No findings suspicious for residual tumor or adenopathy in the pelvis. 2. No CT findings to suggest omental, peritoneal surface or solid organ metastatic disease in the abdomen. 3. Changes consistent with known sarcoidosis in the chest with bulky calcified mediastinal and hilar lymph nodes and interstitial lung disease. I do not see any definite CT findings to suggest metastatic disease involving the chest.   08/19/2018 - 09/17/2018 Radiation Therapy   08/19/18 - 09/17/18: Vaginal Cuff, 3 cm cylinder with treatment length of 3 cm / 30 Gy delivered in 5 fractions of 6 Gy, Brachytherapy / HDR, Iridium-192   05/2005: Left Chest Wall keloid   04/2004 - 05/2004: Anus, pelvis /inguinal ( Dr. Tammi Klippel)   01/19/2020 Imaging   1. No definite CT findings of the abdomen or pelvis to explain pain. 2. The sigmoid colon and rectum are decompressed although slightly thickened appearing, suggestive of nonspecific infectious, inflammatory, or ischemic colitis. Correlate for referable clinical symptoms. 3. There is trace, nonspecific free fluid in the low pelvis, which may be reactive, although would be difficult to distinguish from postoperative and post treatment soft tissue change in the pelvis given history of hysterectomy and rectal cancer. 4. Unchanged  prominent retroperitoneal and iliac lymph nodes. No new findings suspicious for metastatic disease in the abdomen or pelvis. 5. Bibasilar pulmonary fibrosis and mediastinal and hilar lymphadenopathy, better evaluated by prior CT chest and consistent with patient diagnosis of sarcoidosis.       04/18/2020 Imaging   Venous Doppler Left:  - No evidence of deep vein thrombosis seen in the left lower extremity, from the common femoral through the popliteal veins.  - No evidence of superficial venous thrombosis in the left lower extremity.  - There is no evidence of venous reflux seen in the left lower extremity.  - Rouleax flow noted in the superficial system.    05/18/2020 PET scan   1. Complex scan with a combination of hypermetabolic granulomatous disease (sarcoidosis) as well as hypermetabolic metastatic endometrial carcinoma. 2. Hypermetabolic mediastinal hilar lymph nodes and pulmonary nodularity with bronchovascular thickening is consistent with sarcoidosis. 3. Solitary hypermetabolic nodule in the medial LEFT lower lobe, new from prior with intense metabolic activity, is concerning for a metastatic deposit to the  LEFT lower lobe. 4. Ill-defined hypermetabolic soft tissue along the LEFT operator space is most consistent with local recurrence of endometrial carcinoma. There is entrapment of the LEFT ureter by this hypermetabolic soft tissue with mild hydroureter and hydronephrosis on the LEFT. 5. Additionally there appears to be direct extension from the hypermetabolic left operator space soft tissue to the sigmoid colon with there is a well-defined hypermetabolic lesion in the colon. 6. More distant hypermetabolic peritoneal metastasis along the ventral peritoneal surface of the RIGHT abdomen beneath RIGHT rectus abdominus muscle. 7. Hypermetabolic lymph nodes in the porta hepatis are favored related to sarcoidosis.   05/19/2020 Cancer Staging   Staging form: Corpus Uteri - Carcinoma and  Carcinosarcoma, AJCC 8th Edition - Clinical stage from 05/19/2020: Stage IVB (rcT1, cN2a, cM1) - Signed by [name], MD on 05/19/2020    05/26/2020 Procedure   Successful placement of a right internal jugular approach power injectable Port-A-Cath. The catheter is ready for immediate use   05/29/2020 - 09/27/2020 Chemotherapy   The patient had carboplatin and taxol for chemotherapy treatment.     07/31/2020 Imaging   1. Asymmetric soft tissue mass extending from the left vaginal cuff has decreased in size in the interval. No residual peritoneal nodularity. Pulmonary metastases are stable with exception of a minimally improved left lower lobe nodule. 2. Stable soft tissue nodule associated with the right rectus abdominus muscle. 3. Interval resolution of left hydronephrosis. 4. Calcified mediastinal and hilar lymph nodes, together with pulmonary parenchymal fibrosis, findings which are in keeping with patient's known diagnosis of sarcoid. 5. Aortic atherosclerosis (ICD10-I70.0). Coronary artery calcification.   10/27/2020 Imaging   1. Essentially stable appearance of the soft tissue density nodule posteriorly in the left rectus abdominus muscle, currently 1.8 by 1.2 cm, previously 11 mm. 2. Stable calcified adenopathy in the mediastinum and hila, compatible with prior granulomatous disease. 3. Stable small right lower lobe pulmonary nodules. 4. Stable soft tissue density prominence along the left vaginal cuff extending up towards the adnexa, probably therapy related. 5. Other imaging findings of potential clinical significance: Coronary atherosclerosis. Small type 1 hiatal hernia. Stable subpleural reticulation favoring the lung bases, possibly from mild fibrosis. 6. Aortic atherosclerosis.   01/19/2021 Imaging   1. Status post hysterectomy. Interval enlargement of soft tissue adjacent to the left aspect of the vaginal cuff and bladder, measuring approximately 4.3 x 2.9 cm in axial section,  previously previously previously 3.7 x 1.8 cm when measured similarly. Extensive soft tissue thickening and stranding throughout the low pelvis. 2. Interval enlargement of a metastatic nodule within the right rectus abdominus. New small peritoneal nodule anterior to the sigmoid colon. 3. Interval enlargement of retroperitoneal retroperitoneal lymph nodes. 4. Multiple small bilateral pulmonary nodules, several of which are new and enlarged. 5. Constellation of findings above is consistent with worsened metastatic disease. 6. New left hydronephrosis and hydroureter, the distal left ureter obstructed by soft tissue near the left aspect of the urinary bladder and vaginal cuff. 7. Unchanged enlarged, partially calcified mediastinal and hilar lymph nodes,, in keeping with prior granulomatous infection or sarcoidosis. 8. Mild underlying pulmonary fibrosis, in a UIP pattern. 9. Coronary artery disease.   02/02/2021 - 07/13/2021 Chemotherapy    Patient is on carboplatin and docetaxel    04/17/2021 Imaging   1. Interval decrease in size of multiple pulmonary nodules, with resolution of some previously seen nodules. 2. Interval decrease in size of enlarged retroperitoneal lymph nodes. 3. Interval decrease in size of soft tissue at the  left aspect of the vaginal cuff status post hysterectomy. 4. Interval decrease in size of a soft tissue nodule within the posterior aspect of the right rectus abdominus muscle body. 5. Constellation of findings is consistent with treatment response of locally recurrent, nodal, and distant metastatic disease. 6. There has been interval resolution of previously seen left hydronephrosis and hydroureter. 7. 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   Stable mild partially calcified mediastinal and bilateral hilar lymphadenopathy.   Stable sub-cm abdominal retroperitoneal  lymph nodes.   Aortic Atherosclerosis (ICD10-I70.0).     09/03/2021 - 02/05/2022 Chemotherapy   Patient is on Treatment Plan : UTERINE Lenvatinib + Pembrolizumab q21d      11/21/2021 Imaging   Soft tissue nodule in the RIGHT rectus muscle with enhancement (image 79/2) this measures 1.7 x 1.8 is cm not substantially changed compared to prior imaging from August and with enlargement since May of 2022 where it measured approximately 16 x 11 mm.    Signs of hysterectomy and post treatment related changes in the pelvis. No new signs of disease.   Septal thickening and ground-glass in the chest with similar appearance aside from a new area of bandlike changes in the RIGHT upper lobe favored to be post infectious or inflammatory, attention on follow-up.   Stable enlarged mediastinal and bihilar lymph nodes, partially calcified. Patient has reported history of sarcoidosis. Some of the pulmonary findings above may be related underlying sarcoidosis despite atypical distribution.   Aortic atherosclerosis.   02/20/2022 Imaging   1. New peritoneal/omental nodularity with slightly increased pelvic peritoneal thickening, concerning for peritoneal carcinomatosis. No significant abdominopelvic free fluid. 2. Continued increase in size of the 2.1 cm soft tissue nodule in the right rectus muscle, suspicious for disease involvement. 3. Numerous new bilateral basilar and mid lung predominant pulmonary nodules measuring up to 14 mm, nonspecific but at least somewhat concerning for metastatic disease involvement. With pertinent alternate differential considerations of atypical distribution of pulmonary sarcoid or fungal infection given patient's history of pulmonary sarcoidosis. 4. Slight interval progression of the bibasilar predominant ground-glass opacities and interstitial thickening. 5. Similar appearance of the partially calcified mediastinal and bihilar lymph nodes, likely reflecting sequela of sarcoidosis. 6.   Aortic Atherosclerosis (ICD10-I70.0).     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     02/02/2021 - 07/13/2021 Chemotherapy    Patient is on carboplatin and docetaxel    09/03/2021 - 02/05/2022 Chemotherapy   Patient is on Treatment Plan : UTERINE Lenvatinib + Pembrolizumab q21d        PHYSICAL EXAMINATION: ECOG PERFORMANCE STATUS: 2 - Symptomatic, <50% confined to bed  Vitals:   02/22/22 1149  BP: (!) 173/80  Pulse: (!) 110  Resp: 18  SpO2: 100%  Filed Weights   02/22/22 1149  Weight: 195 lb 9.6 oz (88.7 kg)    GENERAL:alert, no distress and comfortable NEURO: alert & oriented x 3 with fluent speech, no focal motor/sensory deficits  LABORATORY DATA:  I have reviewed the data as listed    Component Value Date/Time   NA 138 02/18/2022 0340   K 3.7 02/18/2022 0340   CL 102 02/18/2022 0340   CO2 25 02/18/2022 0340   GLUCOSE 153 (H) 02/18/2022 0340   BUN 19 02/18/2022 0340   CREATININE 1.06 (H) 02/18/2022 0340   CREATININE 0.82 02/05/2022 1005   CALCIUM 8.9 02/18/2022 0340   PROT 6.9 02/18/2022 0340   ALBUMIN 3.3 (L) 02/18/2022 0340   AST 20 02/18/2022 0340   AST 24 02/05/2022 1005   ALT 22 02/18/2022 0340   ALT 24 02/05/2022 1005   ALKPHOS 90 02/18/2022 0340   BILITOT 0.4 02/18/2022 0340   BILITOT 0.8  02/05/2022 1005   GFRNONAA 59 (L) 02/18/2022 0340   GFRNONAA >60 02/05/2022 1005   GFRAA >60 09/08/2020 0819    No results found for: SPEP, UPEP  Lab Results  Component Value Date   WBC 7.4 02/18/2022   NEUTROABS 5.4 02/17/2022   HGB 11.3 (L) 02/18/2022   HCT 31.7 (L) 02/18/2022   MCV 96.4 02/18/2022   PLT 188 02/18/2022      Chemistry      Component Value Date/Time   NA 138 02/18/2022 0340   K 3.7 02/18/2022 0340   CL 102 02/18/2022 0340   CO2 25 02/18/2022 0340   BUN 19 02/18/2022 0340   CREATININE 1.06 (H) 02/18/2022 0340   CREATININE 0.82 02/05/2022 1005      Component Value Date/Time   CALCIUM 8.9 02/18/2022 0340   ALKPHOS 90 02/18/2022 0340   AST 20 02/18/2022 0340   AST 24 02/05/2022 1005   ALT 22 02/18/2022 0340   ALT 24 02/05/2022 1005   BILITOT 0.4 02/18/2022 0340   BILITOT 0.8 02/05/2022 1005       RADIOGRAPHIC STUDIES: I have reviewed recent CT imaging with the patient and her sister I have personally reviewed the radiological images as listed and agreed with the findings in the report. CT ANGIO HEAD NECK W WO CM  Result Date: 02/18/2022 CLINICAL DATA:  Stroke, follow up EXAM: CT ANGIOGRAPHY HEAD AND NECK TECHNIQUE: Multidetector CT imaging of the head and neck was performed using the standard protocol during bolus administration of intravenous contrast. Multiplanar CT image reconstructions and MIPs were obtained to evaluate the vascular anatomy. Carotid stenosis measurements (when applicable) are obtained utilizing NASCET criteria, using the distal internal carotid diameter as the denominator. RADIATION DOSE REDUCTION: This exam was performed according to the departmental dose-optimization program which includes automated exposure control, adjustment of the mA and/or kV according to patient size and/or use of iterative reconstruction technique. CONTRAST:  50m OMNIPAQUE IOHEXOL 350 MG/ML SOLN COMPARISON:  CT and MRI February 17, 2022. FINDINGS: CT HEAD FINDINGS  Brain: Subcentimeter infarcts seen on recent MRI is not visible by CT. No evidence of acute large vascular territory infarct, acute hemorrhage, mass lesion, midline shift, or hydrocephalus. Vascular: See below. Skull: No acute fracture. Sinuses: Kozel thickening of the visualized maxillary sinuses and scattered ethmoid air cells. Frothy secretions in the left sphenoid sinus. Orbits: No acute finding. Review of the MIP images confirms the above findings CTA NECK FINDINGS Aortic arch: Great vessel origins are patent with mild atherosclerotic narrowing. Right carotid system: Mixed calcific and  noncalcific atherosclerosis at the carotid bifurcation without greater than 50% stenosis. Left carotid system: Mixed calcific and noncalcific atherosclerosis involving the common carotid artery and carotid bifurcation without greater than 50% stenosis. Vertebral arteries: Right dominant. No evidence of significant (greater than 50%) stenosis. Mild narrowing of the vertebral artery origins bilaterally due to atherosclerosis. Skeleton: No evidence of acute abnormality. Other neck: No evidence of acute abnormality. Upper chest: Increased size/bulk of the band like opacity previously seen on 11/21/2021 CT chest, which is partially imaged on this study. There is suggestion that this area may be spiculated. Review of the MIP images confirms the above findings CTA HEAD FINDINGS Anterior circulation: Mild narrowing of bilateral intracranial ICAs due to calcific atherosclerosis. Bilateral MCAs and ACAs are patent without proximal hemodynamically significant stenosis. Approximately 1-2 mm inferiorly directed outpouching arising from the right supraclinoid ICA (series 12, image 70), which could represent an infundibulum with vessel not well seen or small aneurysm. Posterior circulation: Right dominant intradural vertebral artery. Bilateral intradural vertebral arteries, basilar artery, and posterior cerebral arteries are patent without  significant proximal stenosis. Mild multifocal narrowing of the PCAs bilaterally. Venous sinuses: As permitted by contrast timing, patent. Anatomic variants: See above. Review of the MIP images confirms the above findings IMPRESSION: CTA head: 1. No large vessel occlusion. 2. Multiple sites of mild atherosclerotic narrowing without proximal hemodynamically significant stenosis. 3. Approximately 1-2 mm inferiorly directed outpouching arising from the right supraclinoid ICA, which could represent an infundibulum with vessel not well seen or small aneurysm. CTA neck: 1. No significant (greater than 50%) stenosis. 2. Bilateral carotid bifurcation atherosclerosis. 3. Increased size/bulk of the band like opacity previously seen on 11/21/2021 CT chest, which is partially imaged on this study. This could potentially be infectious/inflammatory; however, given the interval increase in size and the potentially spiculated appearance, malignancy is not excluded. Recommend chest CT to fully evaluate and to establish follow-up recommendations. These results will be called to the ordering clinician or representative by the Radiologist Assistant, and communication documented in the PACS or [employer]. Electronically Signed   By: Margaretha Sheffield M.D.   On: 02/18/2022 09:35   CT HEAD WO CONTRAST  Result Date: 02/17/2022 CLINICAL DATA:  Acute neurologic deficit. EXAM: CT HEAD WITHOUT CONTRAST TECHNIQUE: Contiguous axial images were obtained from the base of the skull through the vertex without intravenous contrast. RADIATION DOSE REDUCTION: This exam was performed according to the departmental dose-optimization program which includes automated exposure control, adjustment of the mA and/or kV according to patient size and/or use of iterative reconstruction technique. COMPARISON:  04/14/2010 FINDINGS: Brain: No evidence of acute infarction, hemorrhage, hydrocephalus, extra-axial collection or mass lesion/mass effect. Vascular:  No hyperdense vessel or unexpected calcification. Skull: Normal. Negative for fracture or focal lesion. Sinuses/Orbits: Mucosal thickening is noted involving the ethmoid air cells and left maxillary sinus. Other: None IMPRESSION: 1. No acute intracranial abnormalities. 2. Chronic sinus inflammation. Electronically Signed   By: Kerby Moors M.D.   On: 02/17/2022 14:57   MR BRAIN WO CONTRAST  Result Date: 02/17/2022 CLINICAL DATA:  Neuro deficit, acute, stroke suspected. Right-sided facial numbness and left-sided facial droop. EXAM: MRI HEAD WITHOUT CONTRAST TECHNIQUE: Multiplanar, multiecho pulse sequences of the brain and surrounding structures were obtained without intravenous contrast. COMPARISON:  Head CT 02/17/2022. FINDINGS: Brain: Cerebral volume appears normal for age. Subcentimeter acute infarct within the left corona radiata/caudate nucleus (series 6, image 29). Additional punctate acute infarct within the mid left frontal lobe (series 5, image 85). Mild  multifocal T2 FLAIR hyperintense signal abnormality within the cerebral white matter and pons, nonspecific but compatible with chronic small vessel ischemic disease. No evidence of an intracranial mass. No chronic intracranial blood products. No extra-axial fluid collection. No midline shift. Vascular: Maintained flow voids within the proximal large arterial vessels. Skull and upper cervical spine: No focal suspicious marrow lesion. Sinuses/Orbits: Visualized orbits show no acute finding. Mild mucosal thickening within the left frontal sinus. Mild-to-moderate mucosal thickening and scattered fluid within the bilateral ethmoid air cells. Mucous retention cyst within a posterior right ethmoid air cell. Small volume frothy secretions within the left sphenoid sinus. Mild-to-moderate mucosal thickening within the bilateral maxillary sinuses. Other: Trace fluid within the right mastoid air cells. IMPRESSION: Subcentimeter acute infarct within the left corona  radiata/caudate nucleus. Additional punctate acute infarct within the mid left frontal lobe. Mild chronic small vessel ischemic changes within the cerebral white matter and pons. Paranasal sinus disease, as described. Trace fluid within the right mastoid air cells. Electronically Signed   By: Kellie Simmering D.O.   On: 02/17/2022 17:10   CT CHEST ABDOMEN PELVIS W CONTRAST  Result Date: 02/20/2022 CLINICAL DATA:  History of endometrial carcinoma, restaging. EXAM: CT CHEST, ABDOMEN, AND PELVIS WITH CONTRAST TECHNIQUE: Multidetector CT imaging of the chest, abdomen and pelvis was performed following the standard protocol during bolus administration of intravenous contrast. RADIATION DOSE REDUCTION: This exam was performed according to the departmental dose-optimization program which includes automated exposure control, adjustment of the mA and/or kV according to patient size and/or use of iterative reconstruction technique. CONTRAST:  162m OMNIPAQUE IOHEXOL 300 MG/ML  SOLN COMPARISON:  Multiple priors including most recent CT November 21, 2021 FINDINGS: CT CHEST FINDINGS Cardiovascular: Accessed right chest Port-A-Cath with tip at the superior cavoatrial junction. Aortic and branch vessel atherosclerosis without abdominal aortic aneurysm. No central pulmonary embolus on this nondedicated study. Normal size heart. No significant pericardial effusion/thickening. Mediastinum/Nodes: Partially calcified mediastinal and bilateral hilar lymph nodes are not significantly changed from prior. No new or worsening thoracic nodal disease identified. Previously index lymph nodes are as follows: -precarinal lymph node measures 2.3 cm on image 23/2, unchanged. -subcarinal lymph node measures 1.9 cm in short axis on image 30/2, unchanged. The trachea and esophagus are grossly unremarkable. No discrete thyroid nodule. Lungs/Pleura: Slight interval progression of the bibasilar predominant ground-glass opacities and interstitial  thickening. There are numerous new scattered bilateral solid pulmonary nodules predominantly involving the lower and mid lungs. For reference: -14 mm right lower lobe nodule on image 98/6. -8 mm left lower lobe nodule on image 98/6 -12 mm right upper lobe perihilar nodule on image 84/6 -14 mm nodule in the anterior paramedian right upper lobe on image 51/6 Musculoskeletal: No aggressive lytic or blastic lesion of bone. CT ABDOMEN PELVIS FINDINGS Hepatobiliary: No suspicious hepatic lesion. Gallbladder surgically absent. No biliary ductal dilation. Pancreas: No pancreatic ductal dilation or evidence of acute inflammation. Spleen: No splenomegaly or focal splenic lesion. Adrenals/Urinary Tract: Bilateral adrenal glands appear normal. No hydronephrosis. Symmetric enhancement and excretion of contrast from the bilateral kidneys. Urinary bladder is decompressed. Stomach/Bowel: Radiopaque enteric contrast material traverses the hepatic flexure. Small hiatal hernia otherwise the stomach is unremarkable for degree of distension. No pathologic dilation of small or large bowel. The appendix and terminal ileum appear normal. Vascular/Lymphatic: Aortic and branch vessel atherosclerosis without abdominal aortic aneurysm. No pathologically enlarged abdominal or pelvic lymph nodes. Reproductive: Prior hysterectomy without discrete in enhancing nodularity along the vaginal cuff. Slight interval increase in  the fascial/peritoneal thickening in the pelvis for instance on axial images 96 and 90 7/2. Other: New soft tissue nodularity anterior to the ascending colon measuring up to 8 mm on image 77/2, anterior to the splenic flexure on image 51/2 and subjacent to the left rectus muscle on image 82/2. Musculoskeletal: Increased size of the soft tissue nodule in the right rectus muscle which now measures 21 x 21 mm on image 81/2 previously 18 x 17 mm which has been slowly progressing in size since May 2022 when it measured 16 x 11 mm.  Multilevel degenerative changes spine. Degenerative changes bilateral hips. Chronic changes at the pubic symphysis. No aggressive lytic or blastic lesion of bone. IMPRESSION: 1. New peritoneal/omental nodularity with slightly increased pelvic peritoneal thickening, concerning for peritoneal carcinomatosis. No significant abdominopelvic free fluid. 2. Continued increase in size of the 2.1 cm soft tissue nodule in the right rectus muscle, suspicious for disease involvement. 3. Numerous new bilateral basilar and mid lung predominant pulmonary nodules measuring up to 14 mm, nonspecific but at least somewhat concerning for metastatic disease involvement. With pertinent alternate differential considerations of atypical distribution of pulmonary sarcoid or fungal infection given patient's history of pulmonary sarcoidosis. 4. Slight interval progression of the bibasilar predominant ground-glass opacities and interstitial thickening. 5. Similar appearance of the partially calcified mediastinal and bi hilar lymph nodes, likely reflecting sequela of sarcoidosis. 6.  Aortic Atherosclerosis (ICD10-I70.0). These results will be called to the ordering clinician or representative by the Radiologist Assistant, and communication documented in the PACS or [employer]. Electronically Signed   By: Dahlia Bailiff M.D.   On: 02/20/2022 14:03   ECHOCARDIOGRAM COMPLETE BUBBLE STUDY  Result Date: 02/18/2022    ECHOCARDIOGRAM REPORT   Patient Name:   [name] Date of Exam: 02/18/2022 Medical Rec #:  [id number]     Height:       65.0 in Accession #:    [id number]    Weight:       196.6 lb Date of Birth:  1956-02-11    BSA:          1.964 m Patient Age:    66 years      BP:           177/88 mmHg Patient Gender: F             HR:           59 bpm. Exam Location:  Inpatient Procedure: 2D Echo, Color Doppler, Cardiac Doppler and Strain Analysis Indications:    CVA  History:        Patient has no prior history of Echocardiogram  examinations.                 Risk Factors:Hypertension.  Sonographer:    Luisa Hart RDCS Referring Phys: [id number] [place]  1. Left ventricular ejection fraction, by estimation, is 60 to 65%. The left ventricle has normal function. The left ventricle has no regional wall motion abnormalities. Left ventricular diastolic parameters are consistent with Grade I diastolic dysfunction (impaired relaxation).  2. Right ventricular systolic function is normal. The right ventricular size is normal. There is normal pulmonary artery systolic pressure. The estimated right ventricular systolic pressure is 73.4 mmHg.  3. Left atrial size was mild to moderately dilated.  4. The mitral valve is normal in structure. No evidence of mitral valve regurgitation. No evidence of mitral stenosis.  5. The aortic valve is tricuspid. There is moderate calcification of the aortic valve.  Aortic valve regurgitation is not visualized. No aortic stenosis is present.  6. The inferior vena cava is normal in size with greater than 50% respiratory variability, suggesting right atrial pressure of 3 mmHg.  7. Negative bubble study, no evidence for PFO/ASD. FINDINGS  Left Ventricle: Left ventricular ejection fraction, by estimation, is 60 to 65%. The left ventricle has normal function. The left ventricle has no regional wall motion abnormalities. The left ventricular internal cavity size was normal in size. There is  no left ventricular hypertrophy. Left ventricular diastolic parameters are consistent with Grade I diastolic dysfunction (impaired relaxation). Right Ventricle: The right ventricular size is normal. No increase in right ventricular wall thickness. Right ventricular systolic function is normal. There is normal pulmonary artery systolic pressure. The tricuspid regurgitant velocity is 2.59 m/s, and  with an assumed right atrial pressure of 3 mmHg, the estimated right ventricular systolic pressure is 02.6 mmHg. Left Atrium: Left  atrial size was mild to moderately dilated. Right Atrium: Right atrial size was normal in size. Pericardium: There is no evidence of pericardial effusion. Mitral Valve: The mitral valve is normal in structure. There is mild calcification of the mitral valve leaflet(s). Mild mitral annular calcification. No evidence of mitral valve regurgitation. No evidence of mitral valve stenosis. Tricuspid Valve: The tricuspid valve is normal in structure. Tricuspid valve regurgitation is trivial. Aortic Valve: The aortic valve is tricuspid. There is moderate calcification of the aortic valve. Aortic valve regurgitation is not visualized. No aortic stenosis is present. Aortic valve mean gradient measures 6.0 mmHg. Aortic valve peak gradient measures 12.4 mmHg. Aortic valve area, by VTI measures 1.63 cm. Pulmonic Valve: The pulmonic valve was normal in structure. Pulmonic valve regurgitation is not visualized. Aorta: The aortic root is normal in size and structure. Venous: The inferior vena cava is normal in size with greater than 50% respiratory variability, suggesting right atrial pressure of 3 mmHg. IAS/Shunts: Negative bubble study, no evidence for PFO/ASD.  LEFT VENTRICLE PLAX 2D LVIDd:         3.60 cm     Diastology LVIDs:         1.80 cm     LV e' medial:    5.35 cm/s LV PW:         1.00 cm     LV E/e' medial:  15.7 LV IVS:        1.10 cm     LV e' lateral:   4.74 cm/s LVOT diam:     1.80 cm     LV E/e' lateral: 17.7 LV SV:         70 LV SV Index:   36 LVOT Area:     2.54 cm  LV Volumes (MOD) LV vol d, MOD A2C: 37.8 ml LV vol d, MOD A4C: 50.6 ml LV vol s, MOD A2C: 10.3 ml LV vol s, MOD A4C: 13.4 ml LV SV MOD A2C:     27.5 ml LV SV MOD A4C:     50.6 ml LV SV MOD BP:      34.1 ml RIGHT VENTRICLE RV Basal diam:  3.10 cm RV Mid diam:    2.10 cm RV S prime:     9.00 cm/s TAPSE (M-mode): 2.4 cm LEFT ATRIUM             Index        RIGHT ATRIUM           Index LA diam:  4.10 cm 2.09 cm/m   RA Area:     15.10 cm LA Vol  (A2C):   73.9 ml 37.63 ml/m  RA Volume:   42.00 ml  21.39 ml/m LA Vol (A4C):   76.5 ml 38.95 ml/m LA Biplane Vol: 76.2 ml 38.80 ml/m  AORTIC VALVE                     PULMONIC VALVE AV Area (Vmax):    1.74 cm      PV Vmax:       0.75 m/s AV Area (Vmean):   1.62 cm      PV Peak grad:  2.3 mmHg AV Area (VTI):     1.63 cm AV Vmax:           176.00 cm/s AV Vmean:          116.000 cm/s AV VTI:            0.432 m AV Peak Grad:      12.4 mmHg AV Mean Grad:      6.0 mmHg LVOT Vmax:         120.00 cm/s LVOT Vmean:        73.700 cm/s LVOT VTI:          0.277 m LVOT/AV VTI ratio: 0.64  AORTA Ao Asc diam: 2.90 cm MITRAL VALVE               TRICUSPID VALVE MV Area (PHT): 3.20 cm    TR Peak grad:   26.8 mmHg MV Decel Time: 237 msec    TR Vmax:        259.00 cm/s MV E velocity: 84.00 cm/s MV A velocity: 92.10 cm/s  SHUNTS MV E/A ratio:  0.91        Systemic VTI:  0.28 m                            Systemic Diam: 1.80 cm Dalton McleanMD Electronically signed by Franki Monte Signature Date/Time: 02/18/2022/3:16:58 PM    Final

## 2022-02-22 NOTE — Assessment & Plan Note (Signed)
She was diagnosed with stroke recently but have no further neurological deficits ?She is on antiplatelet agent ?The cause of her stroke could be due to uncontrolled hypertension, dehydration, hypercoagulable state due to her cancer and side effects of Lenvima ?She will continue medical management ?

## 2022-02-22 NOTE — Assessment & Plan Note (Signed)
She has poorly controlled hypertension ?We discussed the importance of aggressive management of her blood pressure and close monitoring due to recent history of stroke ?

## 2022-02-25 ENCOUNTER — Inpatient Hospital Stay: Payer: Medicare HMO | Admitting: Hematology and Oncology

## 2022-02-25 ENCOUNTER — Telehealth: Payer: Self-pay | Admitting: Oncology

## 2022-02-25 ENCOUNTER — Inpatient Hospital Stay: Payer: Medicare HMO

## 2022-02-25 NOTE — Telephone Encounter (Signed)
Called [name] back and talked with [name].  [name] has not made a decision yet.  Advised I will call back tomorrow morning.   ? ?[name] also mentioned that [name] had some abdominal pain last night that was relieved with Tylenol.  She did have a bowel movement today. ?

## 2022-02-25 NOTE — Telephone Encounter (Signed)
Called [place] regarding her treatment decision.  She would like to do the 50/50 treatment which she said was the first treatment she did.  Discussed that the side effects may be worse with combination treatment per Dr. Calton Dach office note.  She said she feels like she is giving up if she chooses one of the single agent treatments.  She is unsure and wants [place] to call back this afternoon for her final decision. ?

## 2022-02-26 ENCOUNTER — Other Ambulatory Visit: Payer: Self-pay | Admitting: Hematology and Oncology

## 2022-02-26 ENCOUNTER — Encounter: Payer: Self-pay | Admitting: Hematology and Oncology

## 2022-02-26 DIAGNOSIS — C775 Secondary and unspecified malignant neoplasm of intrapelvic lymph nodes: Secondary | ICD-10-CM

## 2022-02-26 DIAGNOSIS — C7802 Secondary malignant neoplasm of left lung: Secondary | ICD-10-CM

## 2022-02-26 DIAGNOSIS — C541 Malignant neoplasm of endometrium: Secondary | ICD-10-CM

## 2022-02-26 NOTE — Telephone Encounter (Signed)
Called [name] and she does not want the combination chemotherapy.  She wants the single agent treatment where she can enjoy her life and not worry about being sick.  She did not know the name of it. ?

## 2022-02-26 NOTE — Telephone Encounter (Signed)
I will create plan for carboplatin and sent LOS to see her back next week with chemo ?

## 2022-02-26 NOTE — Telephone Encounter (Signed)
Called Sam (sister) and advised that Nkenge should see her PCP for the rash on her face. Also advised that the schedulers will be calling with an appointment for chemotherapy and to see Dr. Alvy Bimler next week. ?

## 2022-02-26 NOTE — Progress Notes (Signed)
DISCONTINUE ON PATHWAY REGIMEN - Uterine ? ? ?  A cycle is every 21 days: ?    Lenvatinib  ?    Pembrolizumab  ? ?**Always confirm dose/schedule in your pharmacy ordering system** ? 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 ?START OFF PATHWAY REGIMEN - Uterine ? ? ?[id number]:Carboplatin AUC=5 q21 Days: ?  A cycle is every 21 days: ?    Carboplatin  ? ?**Always confirm dose/schedule in your pharmacy ordering system** ? 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

## 2022-02-27 ENCOUNTER — Other Ambulatory Visit: Payer: Self-pay

## 2022-02-27 ENCOUNTER — Other Ambulatory Visit (HOSPITAL_COMMUNITY): Payer: Self-pay

## 2022-02-27 ENCOUNTER — Encounter: Payer: Self-pay | Admitting: Family Medicine

## 2022-02-27 ENCOUNTER — Telehealth: Payer: Self-pay | Admitting: Hematology and Oncology

## 2022-02-27 ENCOUNTER — Encounter: Payer: Self-pay | Admitting: Hematology and Oncology

## 2022-02-27 ENCOUNTER — Ambulatory Visit (INDEPENDENT_AMBULATORY_CARE_PROVIDER_SITE_OTHER): Payer: Medicare HMO | Admitting: Family Medicine

## 2022-02-27 ENCOUNTER — Ambulatory Visit (INDEPENDENT_AMBULATORY_CARE_PROVIDER_SITE_OTHER): Payer: Medicare HMO

## 2022-02-27 VITALS — BP 130/80 | HR 75 | Resp 16 | Ht 65.0 in | Wt 195.0 lb

## 2022-02-27 DIAGNOSIS — Z8673 Personal history of transient ischemic attack (TIA), and cerebral infarction without residual deficits: Secondary | ICD-10-CM | POA: Diagnosis not present

## 2022-02-27 DIAGNOSIS — C7802 Secondary malignant neoplasm of left lung: Secondary | ICD-10-CM | POA: Diagnosis not present

## 2022-02-27 DIAGNOSIS — L219 Seborrheic dermatitis, unspecified: Secondary | ICD-10-CM

## 2022-02-27 DIAGNOSIS — E782 Mixed hyperlipidemia: Secondary | ICD-10-CM

## 2022-02-27 DIAGNOSIS — S59901A Unspecified injury of right elbow, initial encounter: Secondary | ICD-10-CM

## 2022-02-27 DIAGNOSIS — T451X5A Adverse effect of antineoplastic and immunosuppressive drugs, initial encounter: Secondary | ICD-10-CM | POA: Diagnosis not present

## 2022-02-27 DIAGNOSIS — I10 Essential (primary) hypertension: Secondary | ICD-10-CM

## 2022-02-27 DIAGNOSIS — G620 Drug-induced polyneuropathy: Secondary | ICD-10-CM

## 2022-02-27 DIAGNOSIS — M19021 Primary osteoarthritis, right elbow: Secondary | ICD-10-CM | POA: Diagnosis not present

## 2022-02-27 MED ORDER — CLOTRIMAZOLE-BETAMETHASONE 1-0.05 % EX CREA
1.0000 "application " | TOPICAL_CREAM | Freq: Every day | CUTANEOUS | 0 refills | Status: AC
  Filled 2022-02-27: qty 30, 14d supply, fill #0

## 2022-02-27 MED ORDER — DICLOFENAC SODIUM 1 % EX GEL
2.0000 g | Freq: Four times a day (QID) | CUTANEOUS | 0 refills | Status: DC
  Filled 2022-02-27: qty 100, 12d supply, fill #0

## 2022-02-27 MED ORDER — KETOCONAZOLE 2 % EX CREA
1.0000 "application " | TOPICAL_CREAM | Freq: Every day | CUTANEOUS | 0 refills | Status: AC | PRN
  Filled 2022-02-27: qty 30, 14d supply, fill #0

## 2022-02-27 NOTE — Telephone Encounter (Signed)
Scheduled appointment per 3/14 scheduling message. Patient is aware. ?

## 2022-02-27 NOTE — Assessment & Plan Note (Signed)
Complete 3 weeks of Plavix 75 mg daily. ?Continue Aspirin 81 mg daily. ?Stressed the importance of adequate BP control. ?Continue atorvastatin 80 mg daily. ?According to patient, appointment with neurologist is being arranged. ? ?

## 2022-02-27 NOTE — Progress Notes (Signed)
? ? ?ACUTE VISIT ?Chief Complaint  ?Patient presents with  ? Rash  ?  On face, went to ER & given cream; had a stroke and told to stop the cream. Had a round of steroids & now the rash is coming back.   ? ?HPI: ?[name] is a 66 y.o. right handed female with history of hypertension, endometrial cancer, sarcoidosis, hypothyroidism, and hyperlipidemia here today complaining of facial mildly pruritic erythematous rash as described above. ?Rash ?This is a new problem. The current episode started 1 to 4 weeks ago. The problem is unchanged. The affected locations include the face. The rash is characterized by burning and itchiness. Associated symptoms include fatigue. Pertinent negatives include no congestion, cough, diarrhea, eye pain, facial edema, fever, nail changes, rhinorrhea, shortness of breath, sore throat or vomiting. Past treatments include topical steroids. The treatment provided no relief. There is no history of allergies, asthma or eczema.  ? ?She has applied Vaseline and until a few days ago she used neosporin. ? ?Hx of rash intertrigo under breast. ?No scalp lesions. ? ?Evaluated in the ER for this problem on 02/15/22, triamcinolone cream 0.0 25% were recommended, she used treatment for 3 days, before recent hospitalization. ? ?She was last seen on 03/01/20. ? ?Hospitalized from 02/17/22 and discharged home on 02/18/22 for CVA, no residual deficit. ?She presented to the ED c/o worsening right hand numbness and new onset of right-sided face. ?According to pt, an old stroke was found, remember transient episode in 11/2021 when she was having balance issue, she did not seek medical attention at this time. ? ?Head CTA 02/18/2022:  ?1. No large vessel occlusion. ?2. Multiple sites of mild atherosclerotic narrowing without proximal hemodynamically significant stenosis. ?3. Approximately 1-2 mm inferiorly directed outpouching arising from the right supraclinoid ICA, which could represent an infundibulum with  vessel not well seen or small aneurysm. ? ?Neck CTA:  ?1. No significant (greater than 50%) stenosis. ?2. Bilateral carotid bifurcation atherosclerosis. ?Echo: LVEF 32-44 %, diastolic dysfunction grade I. ? ?States that CVA was attributed to chemotherapy agent, so d/c after 3 months, [facility]. ? ?She is currently on Plavix 75 mg daily, she is supposed to complete 3 weeks.  She is on Aspirin 81 mg daily. ? ?HLD on atorvastatin 80 mg daily. ?She is tolerating medication well, no side effects. ?Lab Results  ?Component Value Date  ? CHOL 273 (H) 02/18/2022  ? HDL 32 (L) 02/18/2022  ? LDLCALC 185 (H) 02/18/2022  ? LDLDIRECT 134.0 03/01/2020  ? TRIG 282 (H) 02/18/2022  ? CHOLHDL 8.5 02/18/2022  ? ?Lab Results  ?Component Value Date  ? WBC 7.4 02/18/2022  ? HGB 11.3 (L) 02/18/2022  ? HCT 31.7 (L) 02/18/2022  ? MCV 96.4 02/18/2022  ? PLT 188 02/18/2022  ? ?HTN: Negative for severe/frequent headache, visual changes, chest pain, dyspnea, palpitation, claudication, focal weakness, or worsening edema. ?She is on Bisoprolol 10 mg daily and HCTZ 25 mg daily. ?BP was elevated during her last visit with oncologist, 173/80. ?Reporting Home BP readings 120-130/70-80. ? ?Lab Results  ?Component Value Date  ? CREATININE 1.06 (H) 02/18/2022  ? BUN 19 02/18/2022  ? NA 138 02/18/2022  ? K 3.7 02/18/2022  ? CL 102 02/18/2022  ? CO2 25 02/18/2022  ? ?Right elbow pain: Last week she accidentally hit wall when turning the corner. Local pain and edema developed after trauma. No ecchymosis. limitation of movement.  ?Pain is exacerbated by movement and palpation, alleviated by rest. ? ?Throbbing like  pain, 7/10.  ?She is taking Tylenol 500 mg. ? ?Endometrial cancer: She follows with oncology regularly, last visit on 02/22/2022. ?According to the patient, it seems like chemotherapy has not helped with disease progression. ?Left lower lung solitary hypermetabolic nodule seen on PET scan in 06/04/2020, concerning for metastatic deposit.  Also seen  ill-defined hypermetabolic soft tissue along the left upper airspace consistent with local recurrence of endometrial cancer, which seems to be affecting left ureter.  Left operator space soft tissue to the sigmoid colon with a well-defined hypermetabolic lesion.  Peritoneal metastasis along the ventral peritoneal surface of the right abdomen beneath right rectus abdominal muscle. ? ?She is on prednisone 10 mg daily, dose has been tapered down since hospital discharge.S/P right internal jugular port a cath placement on 05/26/2020. ? ?Peripheral neuropathy: hands intermittent tingling and numbness still present, feet symptoms have greatly improved. ?She is not longer on gabapentin. ? ?Review of Systems  ?Constitutional:  Positive for fatigue. Negative for fever.  ?HENT:  Negative for congestion, rhinorrhea and sore throat.   ?Eyes:  Negative for pain.  ?Respiratory:  Negative for cough, shortness of breath and wheezing.   ?Gastrointestinal:  Negative for abdominal pain, diarrhea and vomiting.  ?Endocrine: Negative for cold intolerance and heat intolerance.  ?Genitourinary:  Negative for decreased urine volume, dysuria and hematuria.  ?Skin:  Positive for rash. Negative for nail changes.  ?Neurological:  Negative for syncope and facial asymmetry.  ?Rest see pertinent positives and negatives per HPI. ? ?Current Outpatient Medications on File Prior to Visit  ?Medication Sig Dispense Refill  ? acetaminophen (TYLENOL) 500 MG tablet Take 500 mg by mouth every 6 (six) hours as needed for mild pain.    ? albuterol (PROAIR HFA) 108 (90 Base) MCG/ACT inhaler 2 puffs every 4 hours as needed only  if your can't catch your breath (Patient taking differently: Inhale 2 puffs into the lungs every 4 (four) hours as needed for wheezing or shortness of breath.) 18 g 2  ? aspirin 81 MG EC tablet Take 1 tablet (81 mg total) by mouth daily. Swallow whole. 30 tablet 2  ? atorvastatin (LIPITOR) 80 MG tablet Take 1 tablet (80 mg total) by  mouth daily. 30 tablet 2  ? bisoprolol (ZEBETA) 10 MG tablet Take 1 tablet (10 mg total) by mouth in the morning and at bedtime. 60 tablet 1  ? cetirizine (ALLERGY, CETIRIZINE,) 10 MG tablet Take 1 tablet (10 mg total) by mouth daily. 30 tablet 0  ? clopidogrel (PLAVIX) 75 MG tablet Take 1 tablet (75 mg total) by mouth daily for 21 days. 21 tablet 0  ? desonide (DESOWEN) 0.05 % lotion Apply topically 2 (two) times daily. Continue following with dermatologist (Patient taking differently: Apply 1 application. topically 2 (two) times daily as needed (skin irritation). Continue following with dermatologist) 59 mL 0  ? diphenhydrAMINE (BENADRYL) 25 MG tablet Take 25 mg by mouth every 6 (six) hours as needed for allergies.    ? famotidine (PEPCID) 20 MG tablet Take 1 tablet (20 mg total) by mouth daily for 10 days. 30 tablet 0  ? hydrochlorothiazide (HYDRODIURIL) 25 MG tablet Take 1 tablet (25 mg total) by mouth daily. 30 tablet 3  ? levothyroxine (SYNTHROID) 100 MCG tablet Take 1 tablet (100 mcg total) by mouth daily before breakfast. 30 tablet 3  ? lidocaine-prilocaine (EMLA) cream Apply 1 application topically as needed. 30 g 3  ? loperamide (IMODIUM) 2 MG capsule Take 2 mg by mouth daily as  needed for diarrhea or loose stools.    ? loratadine (CLARITIN) 10 MG tablet Take 10 mg by mouth daily as needed for allergies.    ? mometasone-formoterol (DULERA) 100-5 MCG/ACT AERO Take 2 puffs first thing in am and then another 2 puffs about 12 hours later. (Patient taking differently: 2 puffs 2 (two) times daily as needed for wheezing or shortness of breath.) 3 each 3  ? ondansetron (ZOFRAN) 8 MG tablet Take 1 tablet (8 mg total) by mouth every 8 (eight) hours as needed for nausea. 30 tablet 3  ? predniSONE (DELTASONE) 10 MG tablet Take 6 tablets by mouth daily for 2 days, THEN 5 tablets daily for 2 days, THEN 4 tablets daily for 2 days, THEN 3 tablets daily for 2 days, THEN 2 tablets daily for 2 days, THEN 1 tablet daily for 2  days. 42 tablet 0  ? predniSONE (STERAPRED UNI-PAK 21 TAB) 10 MG (21) TBPK tablet Take by mouth daily. Take 6 tabs by mouth daily  for 2 days, then 5 tabs for 2 days, then 4 tabs for 2 days, then 3 tabs for 2 d

## 2022-02-27 NOTE — Patient Instructions (Addendum)
A few things to remember from today's visit: ? ?Seborrheic dermatitis - Plan: clotrimazole-betamethasone (LOTRISONE) cream, ketoconazole (NIZORAL) 2 % cream ? ?Injury of right elbow, initial encounter - Plan: DG Elbow Complete Right ? ?Hyperlipidemia, mixed ? ?Peripheral neuropathy due to chemotherapy [facility]), Chronic ? ?We need to check cholesterol and liver in 6 weeks. ? ?If you need refills please call your pharmacy. ?Do not use My Chart to request refills or for acute issues that need immediate attention. ?  ?Seborrheic Dermatitis, Adult ?Seborrheic dermatitis is a skin disease that causes red, scaly patches. It usually occurs on the scalp, and it is often called dandruff. The patches may appear on other parts of the body. Skin patches tend to appear where there are many oil glands in the skin. Areas of the body that are commonly affected include the: ?Scalp. ?Ears. ?Eyebrows. ?Face. ?Bearded area of men's faces. ?Skin folds of the body, such as the armpits, groin, and buttocks. ?Chest. ?The condition may come and go for no known reason, and it is often long-lasting (chronic). ?What are the causes? ?The cause of this condition is not known. ?What increases the risk? ?The following factors may make you more likely to develop this condition: ?Having certain conditions, such as: ?HIV (human immunodeficiency virus). ?AIDS (acquired immunodeficiency syndrome). ?Parkinson's disease. ?Mood disorders, such as depression. ?Being 35-98 years old. ?What are the signs or symptoms? ?Symptoms of this condition include: ?Thick scales on the scalp. ?Redness on the face or in the armpits. ?Skin that is flaky. The flakes may be white or yellow. ?Skin that seems oily or dry but is not helped with moisturizers. ?Itching or burning in the affected areas. ?How is this diagnosed? ?This condition is diagnosed with a medical history and physical exam. A sample of your skin may be tested (skin biopsy). You may need to see a skin specialist  (dermatologist). ?How is this treated? ?There is no cure for this condition, but treatment can help to manage the symptoms. You may get treatment to remove scales, lower the risk of skin infection, and reduce swelling or itching. Treatment may include: ?Creams that reduce skin yeast. ?Medicated shampoo. ?Moisturizing creams or ointments. ?Creams that reduce swelling and irritation (steroids). ?Follow these instructions at home: ?Apply over-the-counter and prescription medicines only as told by your health care provider. ?Use any medicated shampoo, skin creams, or ointments only as told by your health care provider. ?Keep all follow-up visits as told by your health care provider. This is important. ?Contact a health care provider if: ?Your symptoms do not improve with treatment. ?Your symptoms get worse. ?You have new symptoms. ?Get help right away if: ?Your condition rapidly worsens with treatment. ?Summary ?Seborrheic dermatitis is a skin disease that causes red, scaly patches. ?Seborrheic dermatitis commonly affects the scalp, face, and skin folds. ?There is no cure for this condition, but treatment can help to manage the symptoms. ?This information is not intended to replace advice given to you by your health care provider. Make sure you discuss any questions you have with your health care provider. ?Document Revised: 09/09/2019 Document Reviewed: 09/09/2019 ?Elsevier Patient Education ? [place]. ? ?Please be sure medication list is accurate. ?If a new problem present, please set up appointment sooner than planned today. ? ? ? ? ? ? ? ?

## 2022-02-27 NOTE — Assessment & Plan Note (Addendum)
BP adequately controlled. ?Continue Bisoprolol 10 mg daily and HCTZ 25 mg daily ?Continue monitoring BP and following low salt diet. ?Following with oncologist. ? ?

## 2022-02-27 NOTE — Assessment & Plan Note (Signed)
Endometrial cancer with some lesions concerning for metastasis on PET scan in 05/2021. ?Following with oncologist. ? ?

## 2022-02-27 NOTE — Assessment & Plan Note (Signed)
LDL is not at goal. ?Continue Atorvastatin 80 mg daily and low fat diet. ?FLP and ALT to be done in 6 weeks. ?

## 2022-02-27 NOTE — Assessment & Plan Note (Signed)
Problem has improved after discontinuation of chemoradiation. ?Continue adequate skin care. ?

## 2022-03-04 ENCOUNTER — Telehealth: Payer: Self-pay | Admitting: Family Medicine

## 2022-03-04 ENCOUNTER — Encounter: Payer: Self-pay | Admitting: Gastroenterology

## 2022-03-04 DIAGNOSIS — S59901A Unspecified injury of right elbow, initial encounter: Secondary | ICD-10-CM

## 2022-03-04 NOTE — Telephone Encounter (Addendum)
I called and spoke with patient. She stated the voltaren isn't helping. She will continue to use ice and heat along with elevation. She is aware she can take Tylenol 500 mg 3-4 times daily. She would like to start PT, referral placed. Also suggested that she could try icy hot to see if that helps more than the voltaren. Patient also made aware that elbow x-ray showed arthritis. Patient verbalized understanding.  ?

## 2022-03-04 NOTE — Telephone Encounter (Signed)
I recommended voltaren gel 3-4 times daily for pain along with Tylenol 500 mg 3-4 times daily. ?If pain continues we can arrange PT. ?Thanks, ?BJ ?

## 2022-03-04 NOTE — Telephone Encounter (Signed)
Patient called in requesting something to be sent in for her arm pain because she's now losing sleep from the pain. ? ?Please advise. ?

## 2022-03-05 ENCOUNTER — Encounter: Payer: Self-pay | Admitting: Hematology and Oncology

## 2022-03-05 ENCOUNTER — Inpatient Hospital Stay: Payer: Medicare HMO

## 2022-03-05 ENCOUNTER — Other Ambulatory Visit: Payer: Self-pay

## 2022-03-05 ENCOUNTER — Inpatient Hospital Stay (HOSPITAL_BASED_OUTPATIENT_CLINIC_OR_DEPARTMENT_OTHER): Payer: Medicare HMO | Admitting: Hematology and Oncology

## 2022-03-05 ENCOUNTER — Other Ambulatory Visit (HOSPITAL_COMMUNITY): Payer: Self-pay

## 2022-03-05 ENCOUNTER — Other Ambulatory Visit: Payer: Self-pay | Admitting: Hematology and Oncology

## 2022-03-05 DIAGNOSIS — C775 Secondary and unspecified malignant neoplasm of intrapelvic lymph nodes: Secondary | ICD-10-CM

## 2022-03-05 DIAGNOSIS — C7802 Secondary malignant neoplasm of left lung: Secondary | ICD-10-CM

## 2022-03-05 DIAGNOSIS — I10 Essential (primary) hypertension: Secondary | ICD-10-CM | POA: Diagnosis not present

## 2022-03-05 DIAGNOSIS — Z8673 Personal history of transient ischemic attack (TIA), and cerebral infarction without residual deficits: Secondary | ICD-10-CM

## 2022-03-05 DIAGNOSIS — G893 Neoplasm related pain (acute) (chronic): Secondary | ICD-10-CM

## 2022-03-05 DIAGNOSIS — C541 Malignant neoplasm of endometrium: Secondary | ICD-10-CM

## 2022-03-05 DIAGNOSIS — R600 Localized edema: Secondary | ICD-10-CM

## 2022-03-05 DIAGNOSIS — Z5111 Encounter for antineoplastic chemotherapy: Secondary | ICD-10-CM | POA: Diagnosis not present

## 2022-03-05 LAB — CBC WITH DIFFERENTIAL (CANCER CENTER ONLY)
Abs Immature Granulocytes: 0.04 10*3/uL (ref 0.00–0.07)
Basophils Absolute: 0 10*3/uL (ref 0.0–0.1)
Basophils Relative: 0 %
Eosinophils Absolute: 0.3 10*3/uL (ref 0.0–0.5)
Eosinophils Relative: 3 %
HCT: 33.7 % — ABNORMAL LOW (ref 36.0–46.0)
Hemoglobin: 11.6 g/dL — ABNORMAL LOW (ref 12.0–15.0)
Immature Granulocytes: 0 %
Lymphocytes Relative: 10 %
Lymphs Abs: 1.1 10*3/uL (ref 0.7–4.0)
MCH: 33.2 pg (ref 26.0–34.0)
MCHC: 34.4 g/dL (ref 30.0–36.0)
MCV: 96.6 fL (ref 80.0–100.0)
Monocytes Absolute: 1 10*3/uL (ref 0.1–1.0)
Monocytes Relative: 10 %
Neutro Abs: 7.8 10*3/uL — ABNORMAL HIGH (ref 1.7–7.7)
Neutrophils Relative %: 77 %
Platelet Count: 161 10*3/uL (ref 150–400)
RBC: 3.49 MIL/uL — ABNORMAL LOW (ref 3.87–5.11)
RDW: 15.3 % (ref 11.5–15.5)
WBC Count: 10.2 10*3/uL (ref 4.0–10.5)
nRBC: 0 % (ref 0.0–0.2)

## 2022-03-05 LAB — BASIC METABOLIC PANEL - CANCER CENTER ONLY
Anion gap: 8 (ref 5–15)
BUN: 12 mg/dL (ref 8–23)
CO2: 28 mmol/L (ref 22–32)
Calcium: 9.4 mg/dL (ref 8.9–10.3)
Chloride: 100 mmol/L (ref 98–111)
Creatinine: 0.77 mg/dL (ref 0.44–1.00)
GFR, Estimated: >60 mL/min (ref >60)
Glucose, Bld: 137 mg/dL — ABNORMAL HIGH (ref 70–99)
Potassium: 4 mmol/L (ref 3.5–5.1)
Sodium: 136 mmol/L (ref 135–145)

## 2022-03-05 MED ORDER — DIPHENHYDRAMINE HCL 25 MG PO CAPS
50.0000 mg | ORAL_CAPSULE | Freq: Once | ORAL | Status: AC
  Administered 2022-03-05: 50 mg via ORAL
  Filled 2022-03-05: qty 2

## 2022-03-05 MED ORDER — SODIUM CHLORIDE 0.9% FLUSH
10.0000 mL | Freq: Once | INTRAVENOUS | Status: AC
  Administered 2022-03-05: 10 mL

## 2022-03-05 MED ORDER — SODIUM CHLORIDE 0.9 % IV SOLN
150.0000 mg | Freq: Once | INTRAVENOUS | Status: AC
  Administered 2022-03-05: 150 mg via INTRAVENOUS
  Filled 2022-03-05: qty 150

## 2022-03-05 MED ORDER — PALONOSETRON HCL INJECTION 0.25 MG/5ML
0.2500 mg | Freq: Once | INTRAVENOUS | Status: AC
  Administered 2022-03-05: 0.25 mg via INTRAVENOUS
  Filled 2022-03-05: qty 5

## 2022-03-05 MED ORDER — HEPARIN SOD (PORK) LOCK FLUSH 100 UNIT/ML IV SOLN
500.0000 [IU] | Freq: Once | INTRAVENOUS | Status: AC | PRN
  Administered 2022-03-05: 500 [IU]

## 2022-03-05 MED ORDER — SODIUM CHLORIDE 0.9 % IV SOLN: Freq: Once | INTRAVENOUS | Status: AC

## 2022-03-05 MED ORDER — SODIUM CHLORIDE 0.9 % IV SOLN
503.2000 mg | Freq: Once | INTRAVENOUS | Status: AC
  Administered 2022-03-05: 500 mg via INTRAVENOUS
  Filled 2022-03-05: qty 50

## 2022-03-05 MED ORDER — OXYCODONE HCL 10 MG PO TABS
10.0000 mg | ORAL_TABLET | ORAL | 0 refills | Status: AC | PRN
  Filled 2022-03-05: qty 60, 10d supply, fill #0

## 2022-03-05 MED ORDER — FAMOTIDINE IN NACL 20-0.9 MG/50ML-% IV SOLN
20.0000 mg | Freq: Once | INTRAVENOUS | Status: AC
  Administered 2022-03-05: 20 mg via INTRAVENOUS
  Filled 2022-03-05: qty 50

## 2022-03-05 MED ORDER — SODIUM CHLORIDE 0.9% FLUSH
10.0000 mL | INTRAVENOUS | Status: DC | PRN
  Administered 2022-03-05: 10 mL

## 2022-03-05 MED ORDER — SODIUM CHLORIDE 0.9 % IV SOLN
10.0000 mg | Freq: Once | INTRAVENOUS | Status: AC
  Administered 2022-03-05: 10 mg via INTRAVENOUS
  Filled 2022-03-05: qty 10

## 2022-03-05 MED ORDER — OXYCODONE HCL 5 MG PO TABS
10.0000 mg | ORAL_TABLET | Freq: Once | ORAL | Status: AC
  Administered 2022-03-05: 10 mg via ORAL
  Filled 2022-03-05: qty 2

## 2022-03-05 NOTE — Assessment & Plan Note (Signed)
She has history of uncontrolled hypertension ?Her blood pressure is borderline normal ?Due to poor oral fluid intake, I think she would be at risk of dehydration ?I recommend holding off taking hydrochlorothiazide if she is not able to drink enough fluid ?

## 2022-03-05 NOTE — Assessment & Plan Note (Signed)
Overall, she is weak and complained of excessive fatigue despite near normal hemoglobin ?Her pulmonary exam is normal and I do not believe she needs additional steroids ?We discussed the risk and benefits of continuing palliative chemo and ultimately she agreed to proceed ?I plan to keep carboplatin AUC of 4 to start with due to history of severe anemia with chemotherapy ?She will return here on a weekly basis for supportive care, blood count check and transfusion support as needed ?

## 2022-03-05 NOTE — Progress Notes (Signed)
[facility] OFFICE PROGRESS NOTE  Patient Care Team: [place], [name], MD as PCP - General (Family Medicine) Paulina Fusi Servando Snare, RN as Oncology Nurse Navigator (Oncology)  ASSESSMENT & PLAN:  Endometrial cancer (HCC) Overall, she is weak and complained of excessive fatigue despite near normal hemoglobin Her pulmonary exam is normal and I do not believe she needs additional steroids We discussed the risk and benefits of continuing palliative chemo and ultimately she agreed to proceed I plan to keep carboplatin AUC of 4 to start with due to history of severe anemia with chemotherapy She will return here on a weekly basis for supportive care, blood count check and transfusion support as needed  Cancer associated pain She has multifactorial pain Her elbow pain is caused by injury and her examination is benign except for tenderness in 1 area Tylenol is not helping I recommend low-dose oxycodone to take as needed We discussed risk of constipation I will get my nursing staff to call her in 2 days to check on pain management  Essential hypertension She has history of uncontrolled hypertension Her blood pressure is borderline normal Due to poor oral fluid intake, I think she would be at risk of dehydration I recommend holding off taking hydrochlorothiazide if she is not able to drink enough fluid  Bilateral lower extremity edema She has multifactorial bilateral lower extremity edema Observe only for now  No orders of the defined types were placed in this encounter.   All questions were answered. The patient knows to call the clinic with any problems, questions or concerns. The total time spent in the appointment was 30 minutes encounter with patients including review of chart and various tests results, discussions about plan of care and coordination of care plan   Artis Delay, MD 03/05/2022 12:46 PM  INTERVAL HISTORY: Please see below for problem oriented charting. she  returns for treatment follow-up with her sister She does not feel well She hit her elbow recently and was evaluated with x-ray that came back negative for fracture She tried taking Tylenol and is causing significant pain to the point she had trouble sleeping She have occasional cough and shortness of breath on minimal exertion No recent nausea or changes in bowel habits  REVIEW OF SYSTEMS:   Constitutional: Denies fevers, chills or abnormal weight loss Eyes: Denies blurriness of vision Ears, nose, mouth, throat, and face: Denies mucositis or sore throat Cardiovascular: Denies palpitation, chest discomfort or lower extremity swelling Gastrointestinal:  Denies nausea, heartburn or change in bowel habits Skin: Denies abnormal skin rashes Lymphatics: Denies new lymphadenopathy or easy bruising Neurological:Denies numbness, tingling or new weaknesses Behavioral/Psych: Mood is stable, no new changes  All other systems were reviewed with the patient and are negative.  I have reviewed the past medical history, past surgical history, social history and family history with the patient and they are unchanged from previous note.  ALLERGIES:  is allergic to shellfish allergy and penicillins.  MEDICATIONS:  Current Outpatient Medications  Medication Sig Dispense Refill   Oxycodone HCl 10 MG TABS Take 1 tablet (10 mg total) by mouth every 4 (four) hours as needed for severe pain. 60 tablet 0   acetaminophen (TYLENOL) 500 MG tablet Take 500 mg by mouth every 6 (six) hours as needed for mild pain.     albuterol (PROAIR HFA) 108 (90 Base) MCG/ACT inhaler 2 puffs every 4 hours as needed only  if your can't catch your breath (Patient taking differently: Inhale 2 puffs into  the lungs every 4 (four) hours as needed for wheezing or shortness of breath.) 18 g 2   aspirin 81 MG EC tablet Take 1 tablet (81 mg total) by mouth daily. Swallow whole. 30 tablet 2   atorvastatin (LIPITOR) 80 MG tablet Take 1 tablet  (80 mg total) by mouth daily. 30 tablet 2   bisoprolol (ZEBETA) 10 MG tablet Take 1 tablet (10 mg total) by mouth in the morning and at bedtime. 60 tablet 1   cetirizine (ALLERGY, CETIRIZINE,) 10 MG tablet Take 1 tablet (10 mg total) by mouth daily. 30 tablet 0   clopidogrel (PLAVIX) 75 MG tablet Take 1 tablet (75 mg total) by mouth daily for 21 days. 21 tablet 0   clotrimazole-betamethasone (LOTRISONE) cream Apply to affected area daily for 14 days. 30 g 0   desonide (DESOWEN) 0.05 % lotion Apply topically 2 (two) times daily. Continue following with dermatologist (Patient taking differently: Apply 1 application. topically 2 (two) times daily as needed (skin irritation). Continue following with dermatologist) 59 mL 0   diclofenac Sodium (VOLTAREN) 1 % GEL Apply 2 g topically 4 (four) times daily. 150 g 0   diphenhydrAMINE (BENADRYL) 25 MG tablet Take 25 mg by mouth every 6 (six) hours as needed for allergies.     famotidine (PEPCID) 20 MG tablet Take 1 tablet (20 mg total) by mouth daily for 10 days. 30 tablet 0   hydrochlorothiazide (HYDRODIURIL) 25 MG tablet Take 1 tablet (25 mg total) by mouth daily. 30 tablet 3   ketoconazole (NIZORAL) 2 % cream Apply to affected area daily as needed for irritation. 30 g 0   levothyroxine (SYNTHROID) 100 MCG tablet Take 1 tablet (100 mcg total) by mouth daily before breakfast. 30 tablet 3   lidocaine-prilocaine (EMLA) cream Apply 1 application topically as needed. 30 g 3   loperamide (IMODIUM) 2 MG capsule Take 2 mg by mouth daily as needed for diarrhea or loose stools.     loratadine (CLARITIN) 10 MG tablet Take 10 mg by mouth daily as needed for allergies.     mometasone-formoterol (DULERA) 100-5 MCG/ACT AERO Take 2 puffs first thing in am and then another 2 puffs about 12 hours later. (Patient taking differently: 2 puffs 2 (two) times daily as needed for wheezing or shortness of breath.) 3 each 3   ondansetron (ZOFRAN) 8 MG tablet Take 1 tablet (8 mg total)  by mouth every 8 (eight) hours as needed for nausea. 30 tablet 3   prochlorperazine (COMPAZINE) 10 MG tablet TAKE 1 TABLET BY MOUTH EVERY 6 HOURS AS NEEDED FOR NAUSEA OR VOMITING (Patient taking differently: Take 10 mg by mouth every 6 (six) hours as needed for nausea or vomiting.) 60 tablet 3   triamcinolone (NASACORT) 55 MCG/ACT AERO nasal inhaler Place 2 sprays into each nostril daily. (Patient taking differently: Place 2 sprays into the nose 2 (two) times daily as needed (congestion).) 16.9 mL 12   vitamin C (ASCORBIC ACID) 500 MG tablet Take 500 mg by mouth daily.     No current facility-administered medications for this visit.    SUMMARY OF ONCOLOGIC HISTORY: Oncology History Overview Note  ER/PR neg, MSI stable Progressed on Lenvima and keytruda   Endometrial cancer (HCC)  06/15/2018 Surgery   Surgeon: Quinn Axe     Operation: Robotic-assisted laparoscopic total hysterectomy with bilateral salpingoophorectomy, SLN biopsy     Operative Findings:  : 6cm uterus, normal tubes and ovaries, omentum adherent to the anterior abdominal wall and  uterine fundus. No suspicious nodes.   06/15/2018 Pathology Results   Procedure: Total hysterectomy with bilateral salpingo-oophorectomy. Bilateral obturator sentinel lymph node biopsies. Histologic type: Endometrioid adenocarcinoma. Histologic Grade: FIGO grade 1. Myometrial invasion: Depth of invasion: 15 mm Myometrial thickness: 20 mm Uterine Serosa Involvement: Not identified. Cervical stromal involvement: Not identified. Extent of involvement of other organs: Uninvolved. Lymphovascular invasion: Present. Regional Lymph Nodes: Examined: 2 Sentinel 0 Non-sentinel 2 Total Lymph nodes with metastasis: 0 Isolated tumor cells (< 0.2 mm): 2 Micrometastasis: (> 0.2 mm and < 2.0 mm): 0 Macrometastasis: (> 2.0 mm): 0 Extracapsular extension: N/A. Tumor block for ancillary studies: 4Q-S. MMR / MSI testing: Will be ordered. Pathologic  Stage Classification (pTNM, AJCC 8th edition): pT1b, pN0(i+)   07/20/2018 Imaging   1. Surgical changes from recent hysterectomy. No findings suspicious for residual tumor or adenopathy in the pelvis. 2. No CT findings to suggest omental, peritoneal surface or solid organ metastatic disease in the abdomen. 3. Changes consistent with known sarcoidosis in the chest with bulky calcified mediastinal and hilar lymph nodes and interstitial lung disease. I do not see any definite CT findings to suggest metastatic disease involving the chest.   08/19/2018 - 09/17/2018 Radiation Therapy   08/19/18 - 09/17/18: Vaginal Cuff, 3 cm cylinder with treatment length of 3 cm / 30 Gy delivered in 5 fractions of 6 Gy, Brachytherapy / HDR, Iridium-192   05/2005: Left Chest Wall keloid   04/2004 - 05/2004: Anus, pelvis /inguinal ( Dr. Kathrynn Running)   01/19/2020 Imaging   1. No definite CT findings of the abdomen or pelvis to explain pain. 2. The sigmoid colon and rectum are decompressed although slightly thickened appearing, suggestive of nonspecific infectious, inflammatory, or ischemic colitis. Correlate for referable clinical symptoms. 3. There is trace, nonspecific free fluid in the low pelvis, which may be reactive, although would be difficult to distinguish from postoperative and post treatment soft tissue change in the pelvis given history of hysterectomy and rectal cancer. 4. Unchanged prominent retroperitoneal and iliac lymph nodes. No new findings suspicious for metastatic disease in the abdomen or pelvis. 5. Bibasilar pulmonary fibrosis and mediastinal and hilar lymphadenopathy, better evaluated by prior CT chest and consistent with patient diagnosis of sarcoidosis.       04/18/2020 Imaging   Venous Doppler Left:  - No evidence of deep vein thrombosis seen in the left lower extremity, from the common femoral through the popliteal veins.  - No evidence of superficial venous thrombosis in the left lower extremity.  -  There is no evidence of venous reflux seen in the left lower extremity.  - Rouleax flow noted in the superficial system.    05/18/2020 PET scan   1. Complex scan with a combination of hypermetabolic granulomatous disease (sarcoidosis) as well as hypermetabolic metastatic endometrial carcinoma. 2. Hypermetabolic mediastinal hilar lymph nodes and pulmonary nodularity with bronchovascular thickening is consistent with sarcoidosis. 3. Solitary hypermetabolic nodule in the medial LEFT lower lobe, new from prior with intense metabolic activity, is concerning for a metastatic deposit to the LEFT lower lobe. 4. Ill-defined hypermetabolic soft tissue along the LEFT operator space is most consistent with local recurrence of endometrial carcinoma. There is entrapment of the LEFT ureter by this hypermetabolic soft tissue with mild hydroureter and hydronephrosis on the LEFT. 5. Additionally there appears to be direct extension from the hypermetabolic left operator space soft tissue to the sigmoid colon with there is a well-defined hypermetabolic lesion in the colon. 6. More distant hypermetabolic peritoneal metastasis  along the ventral peritoneal surface of the RIGHT abdomen beneath RIGHT rectus abdominus muscle. 7. Hypermetabolic lymph nodes in the porta hepatis are favored related to sarcoidosis.   05/19/2020 Cancer Staging   Staging form: Corpus Uteri - Carcinoma and Carcinosarcoma, AJCC 8th Edition - Clinical stage from 05/19/2020: Stage IVB (rcT1, cN2a, cM1) - Signed by Artis Delay, MD on 05/19/2020    05/26/2020 Procedure   Successful placement of a right internal jugular approach power injectable Port-A-Cath. The catheter is ready for immediate use   05/29/2020 - 09/27/2020 Chemotherapy   The patient had carboplatin and taxol for chemotherapy treatment.     07/31/2020 Imaging   1. Asymmetric soft tissue mass extending from the left vaginal cuff has decreased in size in the interval. No residual peritoneal  nodularity. Pulmonary metastases are stable with exception of a minimally improved left lower lobe nodule. 2. Stable soft tissue nodule associated with the right rectus abdominus muscle. 3. Interval resolution of left hydronephrosis. 4. Calcified mediastinal and hilar lymph nodes, together with pulmonary parenchymal fibrosis, findings which are in keeping with patient's known diagnosis of sarcoid. 5. Aortic atherosclerosis (ICD10-I70.0). Coronary artery calcification.   10/27/2020 Imaging   1. Essentially stable appearance of the soft tissue density nodule posteriorly in the left rectus abdominus muscle, currently 1.8 by 1.2 cm, previously 11 mm. 2. Stable calcified adenopathy in the mediastinum and hila, compatible with prior granulomatous disease. 3. Stable small right lower lobe pulmonary nodules. 4. Stable soft tissue density prominence along the left vaginal cuff extending up towards the adnexa, probably therapy related. 5. Other imaging findings of potential clinical significance: Coronary atherosclerosis. Small type 1 hiatal hernia. Stable subpleural reticulation favoring the lung bases, possibly from mild fibrosis. 6. Aortic atherosclerosis.   01/19/2021 Imaging   1. Status post hysterectomy. Interval enlargement of soft tissue adjacent to the left aspect of the vaginal cuff and bladder, measuring approximately 4.3 x 2.9 cm in axial section, previously previously previously 3.7 x 1.8 cm when measured similarly. Extensive soft tissue thickening and stranding throughout the low pelvis. 2. Interval enlargement of a metastatic nodule within the right rectus abdominus. New small peritoneal nodule anterior to the sigmoid colon. 3. Interval enlargement of retroperitoneal retroperitoneal lymph nodes. 4. Multiple small bilateral pulmonary nodules, several of which are new and enlarged. 5. Constellation of findings above is consistent with worsened metastatic disease. 6. New left hydronephrosis and  hydroureter, the distal left ureter obstructed by soft tissue near the left aspect of the urinary bladder and vaginal cuff. 7. Unchanged enlarged, partially calcified mediastinal and hilar lymph nodes,, in keeping with prior granulomatous infection or sarcoidosis. 8. Mild underlying pulmonary fibrosis, in a UIP pattern. 9. Coronary artery disease.   02/02/2021 - 07/13/2021 Chemotherapy    Patient is on carboplatin and docetaxel    04/17/2021 Imaging   1. Interval decrease in size of multiple pulmonary nodules, with resolution of some previously seen nodules. 2. Interval decrease in size of enlarged retroperitoneal lymph nodes. 3. Interval decrease in size of soft tissue at the left aspect of the vaginal cuff status post hysterectomy. 4. Interval decrease in size of a soft tissue nodule within the posterior aspect of the right rectus abdominus muscle body. 5. Constellation of findings is consistent with treatment response of locally recurrent, nodal, and distant metastatic disease. 6. There has been interval resolution of previously seen left hydronephrosis and hydroureter. 7. Redemonstrated mild pulmonary fibrosis in a UIP pattern   07/26/2021 Imaging   No new or  progressive disease within the chest, abdomen, or pelvis.   Stable mild partially calcified mediastinal and bilateral hilar lymphadenopathy.   Stable sub-cm abdominal retroperitoneal lymph nodes.   Aortic Atherosclerosis (ICD10-I70.0).     09/03/2021 - 02/05/2022 Chemotherapy   Patient is on Treatment Plan : UTERINE Lenvatinib + Pembrolizumab q21d      11/21/2021 Imaging   Soft tissue nodule in the RIGHT rectus muscle with enhancement (image 79/2) this measures 1.7 x 1.8 is cm not substantially changed compared to prior imaging from August and with enlargement since May of 2022 where it measured approximately 16 x 11 mm.    Signs of hysterectomy and post treatment related changes in the pelvis. No new signs of disease.   Septal  thickening and ground-glass in the chest with similar appearance aside from a new area of bandlike changes in the RIGHT upper lobe favored to be post infectious or inflammatory, attention on follow-up.   Stable enlarged mediastinal and bihilar lymph nodes, partially calcified. Patient has reported history of sarcoidosis. Some of the pulmonary findings above may be related underlying sarcoidosis despite atypical distribution.   Aortic atherosclerosis.   02/20/2022 Imaging   1. New peritoneal/omental nodularity with slightly increased pelvic peritoneal thickening, concerning for peritoneal carcinomatosis. No significant abdominopelvic free fluid. 2. Continued increase in size of the 2.1 cm soft tissue nodule in the right rectus muscle, suspicious for disease involvement. 3. Numerous new bilateral basilar and mid lung predominant pulmonary nodules measuring up to 14 mm, nonspecific but at least somewhat concerning for metastatic disease involvement. With pertinent alternate differential considerations of atypical distribution of pulmonary sarcoid or fungal infection given patient's history of pulmonary sarcoidosis. 4. Slight interval progression of the bibasilar predominant ground-glass opacities and interstitial thickening. 5. Similar appearance of the partially calcified mediastinal and bihilar lymph nodes, likely reflecting sequela of sarcoidosis. 6.  Aortic Atherosclerosis (ICD10-I70.0).     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     02/02/2021 - 07/13/2021 Chemotherapy    Patient is on carboplatin and docetaxel    09/03/2021 - 02/05/2022 Chemotherapy   Patient is on Treatment  Plan : UTERINE Lenvatinib + Pembrolizumab q21d      03/01/2022 -  Chemotherapy   Patient is on Treatment Plan : Uterine Carboplatin (AUC 5) 21d     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HCC)  05/18/2020 Initial Diagnosis   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HCC)   05/29/2020 - 09/27/2020 Chemotherapy   The patient had carboplatin and taxol for chemotherapy treatment.     02/02/2021 - 07/13/2021 Chemotherapy    Patient is on carboplatin and docetaxel    09/03/2021 - 02/05/2022 Chemotherapy   Patient is on Treatment Plan : UTERINE Lenvatinib + Pembrolizumab q21d      03/01/2022 -  Chemotherapy   Patient is on Treatment Plan : Uterine Carboplatin (AUC 5) 21d       PHYSICAL EXAMINATION: ECOG PERFORMANCE STATUS: 2 - Symptomatic, <50% confined to bed  Vitals:   03/05/22 1207  BP: (!) 149/72  Pulse: 79  Resp: 18  Temp: 99.2 F (37.3 C)  SpO2: 100%   Filed Weights   03/05/22 1207  Weight: 194  lb 12.8 oz (88.4 kg)    GENERAL:alert, no distress and comfortable SKIN: skin color, texture, turgor are normal, no rashes or significant lesions EYES: normal, Conjunctiva are pink and non-injected, sclera clear OROPHARYNX:no exudate, no erythema and lips, buccal mucosa, and tongue normal  NECK: supple, thyroid normal size, non-tender, without nodularity LYMPH:  no palpable lymphadenopathy in the cervical, axillary or inguinal LUNGS: clear to auscultation and percussion with normal breathing effort HEART: regular rate & rhythm and no murmurs with mild bilateral lower extremity edema ABDOMEN:abdomen soft, non-tender and normal bowel sounds Musculoskeletal:no cyanosis of digits and no clubbing.  She have significant point tenderness on examination of her right elbow NEURO: alert & oriented x 3 with fluent speech, no focal motor/sensory deficits  LABORATORY DATA:  I have reviewed the data as listed    Component Value Date/Time   NA 136 03/05/2022 1141   K 4.0 03/05/2022 1141    CL 100 03/05/2022 1141   CO2 28 03/05/2022 1141   GLUCOSE 137 (H) 03/05/2022 1141   BUN 12 03/05/2022 1141   CREATININE 0.77 03/05/2022 1141   CALCIUM 9.4 03/05/2022 1141   PROT 6.9 02/18/2022 0340   ALBUMIN 3.3 (L) 02/18/2022 0340   AST 20 02/18/2022 0340   AST 24 02/05/2022 1005   ALT 22 02/18/2022 0340   ALT 24 02/05/2022 1005   ALKPHOS 90 02/18/2022 0340   BILITOT 0.4 02/18/2022 0340   BILITOT 0.8 02/05/2022 1005   GFRNONAA >60 03/05/2022 1141   GFRAA >60 09/08/2020 0819    No results found for: SPEP, UPEP  Lab Results  Component Value Date   WBC 10.2 03/05/2022   NEUTROABS 7.8 (H) 03/05/2022   HGB 11.6 (L) 03/05/2022   HCT 33.7 (L) 03/05/2022   MCV 96.6 03/05/2022   PLT 161 03/05/2022      Chemistry      Component Value Date/Time   NA 136 03/05/2022 1141   K 4.0 03/05/2022 1141   CL 100 03/05/2022 1141   CO2 28 03/05/2022 1141   BUN 12 03/05/2022 1141   CREATININE 0.77 03/05/2022 1141      Component Value Date/Time   CALCIUM 9.4 03/05/2022 1141   ALKPHOS 90 02/18/2022 0340   AST 20 02/18/2022 0340   AST 24 02/05/2022 1005   ALT 22 02/18/2022 0340   ALT 24 02/05/2022 1005   BILITOT 0.4 02/18/2022 0340   BILITOT 0.8 02/05/2022 1005       RADIOGRAPHIC STUDIES: I have personally reviewed the radiological images as listed and agreed with the findings in the report. CT ANGIO HEAD NECK W WO CM  Result Date: 02/18/2022 CLINICAL DATA:  Stroke, follow up EXAM: CT ANGIOGRAPHY HEAD AND NECK TECHNIQUE: Multidetector CT imaging of the head and neck was performed using the standard protocol during bolus administration of intravenous contrast. Multiplanar CT image reconstructions and MIPs were obtained to evaluate the vascular anatomy. Carotid stenosis measurements (when applicable) are obtained utilizing NASCET criteria, using the distal internal carotid diameter as the denominator. RADIATION DOSE REDUCTION: This exam was performed according to the departmental  dose-optimization program which includes automated exposure control, adjustment of the mA and/or kV according to patient size and/or use of iterative reconstruction technique. CONTRAST:  75mL OMNIPAQUE IOHEXOL 350 MG/ML SOLN COMPARISON:  CT and MRI February 17, 2022. FINDINGS: CT HEAD FINDINGS Brain: Subcentimeter infarcts seen on recent MRI is not visible by CT. No evidence of acute large vascular territory infarct, acute hemorrhage, mass lesion, midline shift, or  hydrocephalus. Vascular: See below. Skull: No acute fracture. Sinuses: Kozel thickening of the visualized maxillary sinuses and scattered ethmoid air cells. Frothy secretions in the left sphenoid sinus. Orbits: No acute finding. Review of the MIP images confirms the above findings CTA NECK FINDINGS Aortic arch: Great vessel origins are patent with mild atherosclerotic narrowing. Right carotid system: Mixed calcific and noncalcific atherosclerosis at the carotid bifurcation without greater than 50% stenosis. Left carotid system: Mixed calcific and noncalcific atherosclerosis involving the common carotid artery and carotid bifurcation without greater than 50% stenosis. Vertebral arteries: Right dominant. No evidence of significant (greater than 50%) stenosis. Mild narrowing of the vertebral artery origins bilaterally due to atherosclerosis. Skeleton: No evidence of acute abnormality. Other neck: No evidence of acute abnormality. Upper chest: Increased size/bulk of the band like opacity previously seen on 11/21/2021 CT chest, which is partially imaged on this study. There is suggestion that this area may be spiculated. Review of the MIP images confirms the above findings CTA HEAD FINDINGS Anterior circulation: Mild narrowing of bilateral intracranial ICAs due to calcific atherosclerosis. Bilateral MCAs and ACAs are patent without proximal hemodynamically significant stenosis. Approximately 1-2 mm inferiorly directed outpouching arising from the right  supraclinoid ICA (series 12, image 70), which could represent an infundibulum with vessel not well seen or small aneurysm. Posterior circulation: Right dominant intradural vertebral artery. Bilateral intradural vertebral arteries, basilar artery, and posterior cerebral arteries are patent without significant proximal stenosis. Mild multifocal narrowing of the PCAs bilaterally. Venous sinuses: As permitted by contrast timing, patent. Anatomic variants: See above. Review of the MIP images confirms the above findings IMPRESSION: CTA head: 1. No large vessel occlusion. 2. Multiple sites of mild atherosclerotic narrowing without proximal hemodynamically significant stenosis. 3. Approximately 1-2 mm inferiorly directed outpouching arising from the right supraclinoid ICA, which could represent an infundibulum with vessel not well seen or small aneurysm. CTA neck: 1. No significant (greater than 50%) stenosis. 2. Bilateral carotid bifurcation atherosclerosis. 3. Increased size/bulk of the band like opacity previously seen on 11/21/2021 CT chest, which is partially imaged on this study. This could potentially be infectious/inflammatory; however, given the interval increase in size and the potentially spiculated appearance, malignancy is not excluded. Recommend chest CT to fully evaluate and to establish follow-up recommendations. These results will be called to the ordering clinician or representative by the Radiologist Assistant, and communication documented in the PACS or [employer]. Electronically Signed   By: Feliberto Harts M.D.   On: 02/18/2022 09:35   DG Elbow Complete Right  Result Date: 02/28/2022 CLINICAL DATA:  Right lateral elbow pain, localized edema and limited range of motion. Hit elbow against wall 1 week ago. EXAM: RIGHT ELBOW - COMPLETE 3+ VIEW COMPARISON:  None. FINDINGS: Minimal medial aspect of the elbow joint space narrowing and peripheral degenerative spurring. Minimal degenerative spurring  at the tip of the coronoid process. No joint effusion. No acute fracture or dislocation. 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 Electronically Signed   By: Neita Garnet M.D.   On: 02/28/2022 15:59   CT HEAD WO CONTRAST  Result Date: 02/17/2022 CLINICAL DATA:  Acute neurologic deficit. EXAM: CT HEAD WITHOUT CONTRAST TECHNIQUE: Contiguous axial images were obtained from the base of the skull through the vertex without intravenous contrast. RADIATION DOSE REDUCTION: This exam was performed according to the departmental dose-optimization program which includes automated exposure control, adjustment of the mA and/or kV according to patient size and/or use of iterative reconstruction technique. COMPARISON:  04/14/2010 FINDINGS: Brain: No evidence of acute infarction, hemorrhage, hydrocephalus, extra-axial collection or mass lesion/mass effect. Vascular: No hyperdense vessel or unexpected calcification. Skull: Normal. Negative for fracture or focal lesion. Sinuses/Orbits: Mucosal thickening is noted involving the ethmoid air cells and left maxillary sinus. Other: None IMPRESSION: 1. No acute intracranial abnormalities. 2. Chronic sinus inflammation. Electronically Signed   By: Signa Kell M.D.   On: 02/17/2022 14:57   MR BRAIN WO CONTRAST  Result Date: 02/17/2022 CLINICAL DATA:  Neuro deficit, acute, stroke suspected. Right-sided facial numbness and left-sided facial droop. EXAM: MRI HEAD WITHOUT CONTRAST TECHNIQUE: Multiplanar, multiecho pulse sequences of the brain and surrounding structures were obtained without intravenous contrast. COMPARISON:  Head CT 02/17/2022. FINDINGS: Brain: Cerebral volume appears normal for age. Subcentimeter acute infarct within the left corona radiata/caudate nucleus (series 6, image 29). Additional punctate acute infarct within the mid left frontal lobe (series 5, image 85). Mild multifocal T2 FLAIR hyperintense signal abnormality within  the cerebral white matter and pons, nonspecific but compatible with chronic small vessel ischemic disease. No evidence of an intracranial mass. No chronic intracranial blood products. No extra-axial fluid collection. No midline shift. Vascular: Maintained flow voids within the proximal large arterial vessels. Skull and upper cervical spine: No focal suspicious marrow lesion. Sinuses/Orbits: Visualized orbits show no acute finding. Mild mucosal thickening within the left frontal sinus. Mild-to-moderate mucosal thickening and scattered fluid within the bilateral ethmoid air cells. Mucous retention cyst within a posterior right ethmoid air cell. Small volume frothy secretions within the left sphenoid sinus. Mild-to-moderate mucosal thickening within the bilateral maxillary sinuses. Other: Trace fluid within the right mastoid air cells. 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 Additional punctate acute infarct within the mid left frontal lobe. Mild chronic small vessel ischemic changes within the cerebral white matter and pons. Paranasal sinus disease, as described. Trace fluid within the right mastoid air cells. Electronically Signed   By: Jackey Loge D.O.   On: 02/17/2022 17:10   CT CHEST ABDOMEN PELVIS W CONTRAST  Result Date: 02/20/2022 CLINICAL DATA:  History of endometrial carcinoma, restaging. EXAM: CT CHEST, ABDOMEN, AND PELVIS WITH CONTRAST TECHNIQUE: Multidetector CT imaging of the chest, abdomen and pelvis was performed following the standard protocol during bolus administration of intravenous contrast. RADIATION DOSE REDUCTION: This exam was performed according to the departmental dose-optimization program which includes automated exposure control, adjustment of the mA and/or kV according to patient size and/or use of iterative reconstruction technique. CONTRAST:  OMNIPAQUE IOHEXOL 300 MG/ML  SOLN COMPARISON:  Multiple priors including most recent CT  November 21, 2021 FINDINGS: CT CHEST FINDINGS Cardiovascular: Accessed right chest Port-A-Cath with tip at the superior cavoatrial junction. Aortic and branch vessel atherosclerosis without abdominal aortic aneurysm. No central pulmonary embolus on this nondedicated study. Normal size heart. No significant pericardial effusion/thickening. Mediastinum/Nodes: Partially calcified mediastinal and bilateral hilar lymph nodes are not significantly changed from prior. No new or worsening thoracic nodal disease identified. Previously index lymph nodes are as follows: -precarinal lymph node measures 2.3 cm on image 23/2, unchanged. -subcarinal lymph node measures 1.9 cm in short axis on image 30/2, unchanged. The trachea and esophagus are grossly unremarkable. No discrete thyroid nodule. Lungs/Pleura: Slight interval progression of the bibasilar predominant ground-glass opacities and interstitial thickening. There are numerous new scattered bilateral solid pulmonary nodules predominantly involving the lower and mid lungs. For reference: -14 mm right lower lobe nodule on image 98/6. -8 mm left lower lobe nodule on image 98/6 -  12 mm right upper lobe perihilar nodule on image 84/6 -14 mm nodule in the anterior paramedian right upper lobe on image 51/6 Musculoskeletal: No aggressive lytic or blastic lesion of bone. CT ABDOMEN PELVIS FINDINGS Hepatobiliary: No suspicious hepatic lesion. Gallbladder surgically absent. No biliary ductal dilation. Pancreas: No pancreatic ductal dilation or evidence of acute inflammation. Spleen: No splenomegaly or focal splenic lesion. Adrenals/Urinary Tract: Bilateral adrenal glands appear normal. No hydronephrosis. Symmetric enhancement and excretion of contrast from the bilateral kidneys. Urinary bladder is decompressed. Stomach/Bowel: Radiopaque enteric contrast material traverses the hepatic flexure. Small hiatal hernia otherwise the stomach is unremarkable for degree of distension. No  pathologic dilation of small or large bowel. The appendix and terminal ileum appear normal. Vascular/Lymphatic: Aortic and branch vessel atherosclerosis without abdominal aortic aneurysm. No pathologically enlarged abdominal or pelvic lymph nodes. Reproductive: Prior hysterectomy without discrete in enhancing nodularity along the vaginal cuff. Slight interval increase in the fascial/peritoneal thickening in the pelvis for instance on axial images 96 and 90 7/2. Other: New soft tissue nodularity anterior to the ascending colon measuring up to 8 mm on image 77/2, anterior to the splenic flexure on image 51/2 and subjacent to the left rectus muscle on image 82/2. Musculoskeletal: Increased size of the soft tissue nodule in the right rectus muscle which now measures 21 x 21 mm on image 81/2 previously 18 x 17 mm which has been slowly progressing in size since May 2022 when it measured 16 x 11 mm. Multilevel degenerative changes spine. Degenerative changes bilateral hips. Chronic changes at the pubic symphysis. No aggressive lytic or blastic lesion of bone. IMPRESSION: 1. New peritoneal/omental nodularity with slightly increased pelvic peritoneal thickening, concerning for peritoneal carcinomatosis. No significant abdominopelvic free fluid. 2. Continued increase in size of the 2.1 cm soft tissue nodule in the right rectus muscle, suspicious for disease involvement. 3. Numerous new bilateral basilar and mid lung predominant pulmonary nodules measuring up to 14 mm, nonspecific but at least somewhat concerning for metastatic disease involvement. With pertinent alternate differential considerations of atypical distribution of pulmonary sarcoid or fungal infection given patient's history of pulmonary sarcoidosis. 4. Slight interval progression of the bibasilar predominant ground-glass opacities and interstitial thickening. 5. Similar appearance of the partially calcified mediastinal and bi hilar lymph nodes, likely reflecting  sequela of sarcoidosis. 6.  Aortic Atherosclerosis (ICD10-I70.0). These results will be called to the ordering clinician or representative by the Radiologist Assistant, and communication documented in the PACS or [employer]. Electronically Signed   By: Maudry Mayhew M.D.   On: 02/20/2022 14:03   ECHOCARDIOGRAM COMPLETE BUBBLE STUDY  Result Date: 02/18/2022    ECHOCARDIOGRAM REPORT   Patient Name:   NATALLE KUHN Date of Exam: 02/18/2022 Medical Rec #:  [id number]     Height:       65.0 in Accession #:    [id number]    Weight:       196.6 lb Date of Birth:  [birth year]    BSA:          1.964 m Patient Age:    65 years      BP:           177/88 mmHg Patient Gender: F             HR:           59 bpm. Exam Location:  Inpatient Procedure: 2D Echo, Color Doppler, Cardiac Doppler and Strain Analysis Indications:    CVA  History:  Patient has no prior history of Echocardiogram examinations.                 Risk Factors:Hypertension.  Sonographer:    Neomia Dear RDCS Referring Phys: [id number] FANG XU IMPRESSIONS  1. Left ventricular ejection fraction, by estimation, is 60 to 65%. The left ventricle has normal function. The left ventricle has no regional wall motion abnormalities. Left ventricular diastolic parameters are consistent with Grade I diastolic dysfunction (impaired relaxation).  2. Right ventricular systolic function is normal. The right ventricular size is normal. There is normal pulmonary artery systolic pressure. The estimated right ventricular systolic pressure is 29.8 mmHg.  3. Left atrial size was mild to moderately dilated.  4. The mitral valve is normal in structure. No evidence of mitral valve regurgitation. No evidence of mitral stenosis.  5. The aortic valve is tricuspid. There is moderate calcification of the aortic valve. Aortic valve regurgitation is not visualized. No aortic stenosis is present.  6. The inferior vena cava is normal in size with greater than 50% respiratory variability,  suggesting right atrial pressure of 3 mmHg.  7. Negative bubble study, no evidence for PFO/ASD. FINDINGS  Left Ventricle: Left ventricular ejection fraction, by estimation, is 60 to 65%. The left ventricle has normal function. The left ventricle has no regional wall motion abnormalities. The left ventricular internal cavity size was normal in size. There is  no left ventricular hypertrophy. Left ventricular diastolic parameters are consistent with Grade I diastolic dysfunction (impaired relaxation). Right Ventricle: The right ventricular size is normal. No increase in right ventricular wall thickness. Right ventricular systolic function is normal. There is normal pulmonary artery systolic pressure. The tricuspid regurgitant velocity is 2.59 m/s, and  with an assumed right atrial pressure of 3 mmHg, the estimated right ventricular systolic pressure is 29.8 mmHg. Left Atrium: Left atrial size was mild to moderately dilated. Right Atrium: Right atrial size was normal in size. Pericardium: There is no evidence of pericardial effusion. Mitral Valve: The mitral valve is normal in structure. There is mild calcification of the mitral valve leaflet(s). Mild mitral annular calcification. No evidence of mitral valve regurgitation. No evidence of mitral valve stenosis. Tricuspid Valve: The tricuspid valve is normal in structure. Tricuspid valve regurgitation is trivial. Aortic Valve: The aortic valve is tricuspid. There is moderate calcification of the aortic valve. Aortic valve regurgitation is not visualized. No aortic stenosis is present. Aortic valve mean gradient measures 6.0 mmHg. Aortic valve peak gradient measures 12.4 mmHg. Aortic valve area, by VTI measures 1.63 cm. Pulmonic Valve: The pulmonic valve was normal in structure. Pulmonic valve regurgitation is not visualized. Aorta: The aortic root is normal in size and structure. Venous: The inferior vena cava is normal in size with greater than 50% respiratory  variability, suggesting right atrial pressure of 3 mmHg. IAS/Shunts: Negative bubble study, no evidence for PFO/ASD.  LEFT VENTRICLE PLAX 2D LVIDd:         3.60 cm     Diastology LVIDs:         1.80 cm     LV e' medial:    5.35 cm/s LV PW:         1.00 cm     LV E/e' medial:  15.7 LV IVS:        1.10 cm     LV e' lateral:   4.74 cm/s LVOT diam:     1.80 cm     LV E/e' lateral: 17.7 LV SV:  70 LV SV Index:   36 LVOT Area:     2.54 cm  LV Volumes (MOD) LV vol d, MOD A2C: 37.8 ml LV vol d, MOD A4C: 50.6 ml LV vol s, MOD A2C: 10.3 ml LV vol s, MOD A4C: 13.4 ml LV SV MOD A2C:     27.5 ml LV SV MOD A4C:     50.6 ml LV SV MOD BP:      34.1 ml RIGHT VENTRICLE RV Basal diam:  3.10 cm RV Mid diam:    2.10 cm RV S prime:     9.00 cm/s TAPSE (M-mode): 2.4 cm LEFT ATRIUM             Index        RIGHT ATRIUM           Index LA diam:        4.10 cm 2.09 cm/m   RA Area:     15.10 cm LA Vol (A2C):   73.9 ml 37.63 ml/m  RA Volume:   42.00 ml  21.39 ml/m LA Vol (A4C):   76.5 ml 38.95 ml/m LA Biplane Vol: 76.2 ml 38.80 ml/m  AORTIC VALVE                     PULMONIC VALVE AV Area (Vmax):    1.74 cm      PV Vmax:       0.75 m/s AV Area (Vmean):   1.62 cm      PV Peak grad:  2.3 mmHg AV Area (VTI):     1.63 cm AV Vmax:           176.00 cm/s AV Vmean:          116.000 cm/s AV VTI:            0.432 m AV Peak Grad:      12.4 mmHg AV Mean Grad:      6.0 mmHg LVOT Vmax:         120.00 cm/s LVOT Vmean:        73.700 cm/s LVOT VTI:          0.277 m LVOT/AV VTI ratio: 0.64  AORTA Ao Asc diam: 2.90 cm MITRAL VALVE               TRICUSPID VALVE MV Area (PHT): 3.20 cm    TR Peak grad:   26.8 mmHg MV Decel Time: 237 msec    TR Vmax:        259.00 cm/s MV E velocity: 84.00 cm/s MV A velocity: 92.10 cm/s  SHUNTS MV E/A ratio:  0.91        Systemic VTI:  0.28 m                            Systemic Diam: 1.80 cm Dalton McleanMD Electronically signed by Wilfred Lacy Signature Date/Time: 02/18/2022/3:16:58 PM    Final

## 2022-03-05 NOTE — Assessment & Plan Note (Signed)
She has multifactorial bilateral lower extremity edema ?Observe only for now ?

## 2022-03-05 NOTE — Assessment & Plan Note (Signed)
She has multifactorial pain ?Her elbow pain is caused by injury and her examination is benign except for tenderness in 1 area ?Tylenol is not helping ?I recommend low-dose oxycodone to take as needed ?We discussed risk of constipation ?I will get my nursing staff to call her in 2 days to check on pain management ?

## 2022-03-05 NOTE — Patient Instructions (Signed)
[facility] MEDICAL [facility]COLOG[facility]  Discharge Instructions: Thank you for choosing [facility] to provide your oncology and hematology care.   If you have a lab appointment with the Cancer Center, please go directly to the Cancer Center and check in at the registration area.   Wear comfortable clothing and clothing appropriate for easy access to any Portacath or PICC line.   We strive to give you quality time with your provider. You may need to reschedule your appointment if you arrive late (15 or more minutes).  Arriving late affects you and other patients whose appointments are after yours.  Also, if you miss three or more appointments without notifying the office, you may be dismissed from the clinic at the provider's discretion.      For prescription refill requests, have your pharmacy contact our office and allow 72 hours for refills to be completed.    Today you received the following chemotherapy and/or immunotherapy agents :Carboplatin   To help prevent nausea and vomiting after your treatment, we encourage you to take your nausea medication as directed.  BELOW ARE SYMPTOMS THAT SHOULD BE REPORTED IMMEDIATELY: *FEVER GREATER THAN 100.4 F (38 C) OR HIGHER *CHILLS OR SWEATING *NAUSEA AND VOMITING THAT IS NOT CONTROLLED WITH YOUR NAUSEA MEDICATION *UNUSUAL SHORTNESS OF BREATH *UNUSUAL BRUISING OR BLEEDING *URINARY PROBLEMS (pain or burning when urinating, or frequent urination) *BOWEL PROBLEMS (unusual diarrhea, constipation, pain near the anus) TENDERNESS IN MOUTH AND THROAT WITH OR WITHOUT PRESENCE OF ULCERS (sore throat, sores in mouth, or a toothache) UNUSUAL RASH, SWELLING OR PAIN  UNUSUAL VAGINAL DISCHARGE OR ITCHING   Items with * indicate a potential emergency and should be followed up as soon as possible or go to the Emergency Department if any problems should occur.  Please show the CHEMOTHERAPY ALERT CARD or IMMUNOTHERAPY ALERT CARD at check-in to  the Emergency Department and triage nurse.  Should you have questions after your visit or need to cancel or reschedule your appointment, please contact [facility]  Dept: [phone]  and follow the prompts.  Office hours are 8:00 a.m. to 4:30 p.m. Monday - Friday. Please note that voicemails left after 4:00 p.m. may not be returned until the following business day.  We are closed weekends and major holidays. You have access to a nurse at all times for urgent questions. Please call the main number to the clinic Dept: [phone] and follow the prompts.   For any non-urgent questions, you may also contact your provider using MyChart. We now offer e-Visits for anyone 18 and older to request care online for non-urgent symptoms. For details visit [email or web address].   Also download the MyChart app! Go to the app store, search "MyChart", open the app, select [facility], and log in with your MyChart username and password.  Due to Covid, a mask is required upon entering the hospital/clinic. If you do not have a mask, one will be given to you upon arrival. For doctor visits, patients may have 1 support person aged 18 or older with them. For treatment visits, patients cannot have anyone with them due to current Covid guidelines and our immunocompromised population.   

## 2022-03-07 ENCOUNTER — Telehealth: Payer: Self-pay | Admitting: Oncology

## 2022-03-07 NOTE — Telephone Encounter (Signed)
[name] and she said her right elbow pain is a little better but it still sore.  She is taking oxycodone twice a day and applying ice packs to the area as needed.   ? ?She has not had a bowel movement since Tuesday but thinks she will have one this morning.  She is taking Miralax.  Advised her she can take Miralax again tonight if she has not had a bowel movement and also can try taking Senakot three times a day.  She verbalized understanding. ?

## 2022-03-09 ENCOUNTER — Other Ambulatory Visit: Payer: Self-pay

## 2022-03-09 ENCOUNTER — Emergency Department (HOSPITAL_COMMUNITY): Payer: Medicare HMO

## 2022-03-09 ENCOUNTER — Inpatient Hospital Stay (HOSPITAL_COMMUNITY)
Admission: EM | Admit: 2022-03-09 | Discharge: 2022-03-12 | DRG: 193 | Disposition: A | Discharge status: Home / Self Care | Payer: Medicare HMO | Attending: Internal Medicine | Admitting: Internal Medicine

## 2022-03-09 ENCOUNTER — Encounter (HOSPITAL_COMMUNITY): Payer: Self-pay

## 2022-03-09 DIAGNOSIS — T451X5A Adverse effect of antineoplastic and immunosuppressive drugs, initial encounter: Secondary | ICD-10-CM | POA: Diagnosis present

## 2022-03-09 DIAGNOSIS — Z880 Allergy status to penicillin: Secondary | ICD-10-CM | POA: Diagnosis not present

## 2022-03-09 DIAGNOSIS — E039 Hypothyroidism, unspecified: Secondary | ICD-10-CM | POA: Diagnosis present

## 2022-03-09 DIAGNOSIS — C775 Secondary and unspecified malignant neoplasm of intrapelvic lymph nodes: Secondary | ICD-10-CM | POA: Diagnosis present

## 2022-03-09 DIAGNOSIS — C7800 Secondary malignant neoplasm of unspecified lung: Secondary | ICD-10-CM | POA: Diagnosis present

## 2022-03-09 DIAGNOSIS — D849 Immunodeficiency, unspecified: Secondary | ICD-10-CM | POA: Diagnosis present

## 2022-03-09 DIAGNOSIS — C541 Malignant neoplasm of endometrium: Secondary | ICD-10-CM | POA: Diagnosis not present

## 2022-03-09 DIAGNOSIS — Z87891 Personal history of nicotine dependence: Secondary | ICD-10-CM

## 2022-03-09 DIAGNOSIS — I10 Essential (primary) hypertension: Secondary | ICD-10-CM | POA: Diagnosis present

## 2022-03-09 DIAGNOSIS — D860 Sarcoidosis of lung: Secondary | ICD-10-CM | POA: Diagnosis present

## 2022-03-09 DIAGNOSIS — R7989 Other specified abnormal findings of blood chemistry: Secondary | ICD-10-CM | POA: Diagnosis not present

## 2022-03-09 DIAGNOSIS — Z8542 Personal history of malignant neoplasm of other parts of uterus: Secondary | ICD-10-CM | POA: Diagnosis not present

## 2022-03-09 DIAGNOSIS — K5900 Constipation, unspecified: Secondary | ICD-10-CM | POA: Diagnosis present

## 2022-03-09 DIAGNOSIS — C7802 Secondary malignant neoplasm of left lung: Secondary | ICD-10-CM | POA: Diagnosis present

## 2022-03-09 DIAGNOSIS — Z79890 Hormone replacement therapy (postmenopausal): Secondary | ICD-10-CM | POA: Diagnosis not present

## 2022-03-09 DIAGNOSIS — R0902 Hypoxemia: Principal | ICD-10-CM

## 2022-03-09 DIAGNOSIS — D6481 Anemia due to antineoplastic chemotherapy: Secondary | ICD-10-CM | POA: Diagnosis present

## 2022-03-09 DIAGNOSIS — Z923 Personal history of irradiation: Secondary | ICD-10-CM

## 2022-03-09 DIAGNOSIS — J9601 Acute respiratory failure with hypoxia: Secondary | ICD-10-CM | POA: Diagnosis present

## 2022-03-09 DIAGNOSIS — R0602 Shortness of breath: Secondary | ICD-10-CM | POA: Diagnosis present

## 2022-03-09 DIAGNOSIS — Z7982 Long term (current) use of aspirin: Secondary | ICD-10-CM

## 2022-03-09 DIAGNOSIS — C786 Secondary malignant neoplasm of retroperitoneum and peritoneum: Secondary | ICD-10-CM | POA: Diagnosis present

## 2022-03-09 DIAGNOSIS — J189 Pneumonia, unspecified organism: Principal | ICD-10-CM | POA: Diagnosis present

## 2022-03-09 DIAGNOSIS — Z7951 Long term (current) use of inhaled steroids: Secondary | ICD-10-CM

## 2022-03-09 DIAGNOSIS — Z79899 Other long term (current) drug therapy: Secondary | ICD-10-CM

## 2022-03-09 DIAGNOSIS — Z90710 Acquired absence of both cervix and uterus: Secondary | ICD-10-CM

## 2022-03-09 DIAGNOSIS — Z7902 Long term (current) use of antithrombotics/antiplatelets: Secondary | ICD-10-CM

## 2022-03-09 DIAGNOSIS — G893 Neoplasm related pain (acute) (chronic): Secondary | ICD-10-CM | POA: Diagnosis present

## 2022-03-09 DIAGNOSIS — C7801 Secondary malignant neoplasm of right lung: Secondary | ICD-10-CM | POA: Diagnosis not present

## 2022-03-09 DIAGNOSIS — R609 Edema, unspecified: Secondary | ICD-10-CM | POA: Diagnosis not present

## 2022-03-09 DIAGNOSIS — Z825 Family history of asthma and other chronic lower respiratory diseases: Secondary | ICD-10-CM

## 2022-03-09 DIAGNOSIS — Z8673 Personal history of transient ischemic attack (TIA), and cerebral infarction without residual deficits: Secondary | ICD-10-CM

## 2022-03-09 DIAGNOSIS — D869 Sarcoidosis, unspecified: Secondary | ICD-10-CM | POA: Diagnosis present

## 2022-03-09 DIAGNOSIS — Z882 Allergy status to sulfonamides status: Secondary | ICD-10-CM

## 2022-03-09 DIAGNOSIS — Z801 Family history of malignant neoplasm of trachea, bronchus and lung: Secondary | ICD-10-CM

## 2022-03-09 DIAGNOSIS — Z20822 Contact with and (suspected) exposure to covid-19: Secondary | ICD-10-CM | POA: Diagnosis present

## 2022-03-09 DIAGNOSIS — E782 Mixed hyperlipidemia: Secondary | ICD-10-CM | POA: Diagnosis present

## 2022-03-09 DIAGNOSIS — J90 Pleural effusion, not elsewhere classified: Secondary | ICD-10-CM | POA: Diagnosis not present

## 2022-03-09 LAB — CBC WITH DIFFERENTIAL/PLATELET
Abs Immature Granulocytes: 0.03 10*3/uL (ref 0.00–0.07)
Basophils Absolute: 0 10*3/uL (ref 0.0–0.1)
Basophils Relative: 0 %
Eosinophils Absolute: 0.3 10*3/uL (ref 0.0–0.5)
Eosinophils Relative: 3 %
HCT: 32.5 % — ABNORMAL LOW (ref 36.0–46.0)
Hemoglobin: 11.1 g/dL — ABNORMAL LOW (ref 12.0–15.0)
Immature Granulocytes: 0 %
Lymphocytes Relative: 10 %
Lymphs Abs: 0.9 10*3/uL (ref 0.7–4.0)
MCH: 33.7 pg (ref 26.0–34.0)
MCHC: 34.2 g/dL (ref 30.0–36.0)
MCV: 98.8 fL (ref 80.0–100.0)
Monocytes Absolute: 0.9 10*3/uL (ref 0.1–1.0)
Monocytes Relative: 10 %
Neutro Abs: 7.2 10*3/uL (ref 1.7–7.7)
Neutrophils Relative %: 77 %
Platelets: 175 10*3/uL (ref 150–400)
RBC: 3.29 MIL/uL — ABNORMAL LOW (ref 3.87–5.11)
RDW: 14.8 % (ref 11.5–15.5)
WBC: 9.4 10*3/uL (ref 4.0–10.5)
nRBC: 0 % (ref 0.0–0.2)

## 2022-03-09 LAB — COMPREHENSIVE METABOLIC PANEL
ALT: 17 U/L (ref 0–44)
AST: 16 U/L (ref 15–41)
Albumin: 3.5 g/dL (ref 3.5–5.0)
Alkaline Phosphatase: 89 U/L (ref 38–126)
Anion gap: 10 (ref 5–15)
BUN: 17 mg/dL (ref 8–23)
CO2: 25 mmol/L (ref 22–32)
Calcium: 8.7 mg/dL — ABNORMAL LOW (ref 8.9–10.3)
Chloride: 98 mmol/L (ref 98–111)
Creatinine, Ser: 0.88 mg/dL (ref 0.44–1.00)
GFR, Estimated: >60 mL/min (ref >60)
Glucose, Bld: 141 mg/dL — ABNORMAL HIGH (ref 70–99)
Potassium: 4.1 mmol/L (ref 3.5–5.1)
Sodium: 133 mmol/L — ABNORMAL LOW (ref 135–145)
Total Bilirubin: 0.9 mg/dL (ref 0.3–1.2)
Total Protein: 7.1 g/dL (ref 6.5–8.1)

## 2022-03-09 LAB — RESP PANEL BY RT-PCR (FLU A&B, COVID) ARPGX2
Influenza A by PCR: NEGATIVE
Influenza B by PCR: NEGATIVE
SARS Coronavirus 2 by RT PCR: NEGATIVE

## 2022-03-09 LAB — TROPONIN I (HIGH SENSITIVITY): Troponin I (High Sensitivity): 8 ng/L (ref <18)

## 2022-03-09 LAB — BRAIN NATRIURETIC PEPTIDE: B Natriuretic Peptide: 25.5 pg/mL (ref 0.0–100.0)

## 2022-03-09 LAB — D-DIMER, QUANTITATIVE: D-Dimer, Quant: 0.96 ug/mL-FEU — ABNORMAL HIGH (ref 0.00–0.50)

## 2022-03-09 MED ORDER — SODIUM CHLORIDE 0.9 % IV SOLN
1.0000 g | Freq: Once | INTRAVENOUS | Status: AC
  Administered 2022-03-09: 1 g via INTRAVENOUS
  Filled 2022-03-09: qty 10

## 2022-03-09 MED ORDER — SODIUM CHLORIDE 0.9 % IV SOLN
500.0000 mg | Freq: Once | INTRAVENOUS | Status: AC
  Administered 2022-03-09: 500 mg via INTRAVENOUS
  Filled 2022-03-09: qty 5

## 2022-03-09 MED ORDER — OXYCODONE HCL 5 MG PO TABS
10.0000 mg | ORAL_TABLET | Freq: Once | ORAL | Status: AC
  Administered 2022-03-09: 10 mg via ORAL
  Filled 2022-03-09: qty 2

## 2022-03-09 NOTE — ED Triage Notes (Signed)
Patient has been short of breath all day. Patient has stage 4 endometrial cancer, last treatment Tuesday. Patient short of breath even sitting. Presents with cough, nonproductive. Dry cough. ?

## 2022-03-09 NOTE — H&P (Signed)
?History and Physical  ? ? [name] OHY:[id number] DOB: Sep 24, 1956 DOA: 03/09/2022 ? ?PCP: [name], [name], MD  ?Patient coming from: Home ? ?I have personally briefly reviewed patient's old medical records in [place] ? ?Chief Complaint: Dyspnea ? ?HPI: ?[name] is a 66 y.o. 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 ? ?Patient reports 5 days of progressive shortness of breath with exertional dyspnea.  She has had associated nonproductive cough.  She has not had any associated fevers, chills, diaphoresis, nausea, vomiting.  She has had some chest tightness but denies chest pain.  She denies any abdominal pain, dysuria, diarrhea.  She reports occasional swelling to her legs, no worse than her baseline at this time. ? ?She has been on Keytruda for treatment of her endometrial cancer treatment has changed at her recent stroke.  She was started on carboplatin with first treatment on 3/21 just prior to onset of her symptoms. ? ?ED Course  Labs/Imaging on admission: I have personally reviewed following labs and imaging studies. ? ?Initial vitals showed BP 113/70, pulse 116, RR 22, temp 98.2 ?F, SPO2 92% on room air.  SPO2 dropped to 88% and patient was placed on 2 L O2 via [place]. ? ?Labs show WBC 9.4, hemoglobin 11.1, platelets 175,000, sodium 133, potassium 4.1, bicarb 25, BUN 17, creatinine 0.88, serum glucose 141, LFTs within normal limits, troponin 8, D-dimer 0.96. ? ?SARS-CoV-2 and influenza PCR negative.  Blood cultures ordered and pending. ? ?Portable chest x-ray shows right lung base opacity which may represent combination of small pleural  effusion and associated atelectasis or infiltrate.  Bilateral hilar fullness in keeping with adenopathy noted. ? ?Patient was given IV ceftriaxone and azithromycin.  The hospitalist service was consulted to admit for further evaluation and management. ? ?Review of Systems: All systems reviewed and are negative except as documented in history of present illness above. ? ? ?Past Medical History:  ?Diagnosis Date  ? Bifid uvula   ? Blood transfusion without reported diagnosis   ? Endometrial ca [facility]) dx'd 06/2018  ? chemo  ? GERD (gastroesophageal reflux disease)   ? History of cervical dysplasia   ? History of chemotherapy   ? 06/ 2005  ? History of cleft lip   ? CORRECTED  ? History of condyloma acuminatum   ? PERIANAL  ? History of radiation therapy   ? completed 06/ 2005  ? History of vulvar dysplasia   ? Hypertension   ? 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 Pulmonary nodule, left   ? Pulmonary sarcoidosis ([place]) since 1996  ? PULMOLOGIST-  DR Melvyn Novas--  CHRONIC PREDNISONE  ? Sarcoid uveitis of left eye goes to [facility]  ? intermittantly--  currently no issues per pt 11-27-2016  ? 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 S/P  local resection 03/2004.  ChemoXRT 2005 Dr Collier Salina Rubin/[facility]  ? Upper airway cough syndrome   ? Wears glasses   ? ? ?Past Surgical History:  ?Procedure Laterality Date  ? BRONCHOSCOPY  01/22/2008  ? w/ Lavage and bronchial bx  ? CARDIOVASCULAR STRESS TEST  06/27/2006  ? normal nuclear study w/ no ischemia/  normal LV function and wall motion ,  ef 61%  ? CARPAL TUNNEL RELEASE Right 01/17/2016  ? and finger trigger release  ? CLEFT LIP REPAIR    ? CO2 LASER ABLATION VULVA    ? EUA/  EXCISIONAL MULTIPLE PERINEAL/ANAL BX'S/  SIGMOIDSCOPY/  PORT-A-CATH PLACEMENT  03/27/2004  ? Local excision - Dr Lennie Hummer  ? IR IMAGING GUIDED PORT INSERTION  05/26/2020  ? LAPAROSCOPIC CHOLECYSTECTOMY  08/14/2008  ? MULTIPLE ANAL BX'S  10/10/2006  ? RELEASE A1 PULLEY THUMB Bilateral left 12-17-2006/   right 09-05-2009  ? REMOVAL PORT-A-CATH/  EUA/  MULTIPLE ANAL BX'S  10/16/2004  ? ROBOTIC ASSISTED TOTAL HYSTERECTOMY WITH BILATERAL SALPINGO OOPHERECTOMY Bilateral 06/25/2018  ? Procedure: XI ROBOTIC ASSISTED TOTAL HYSTERECTOMY WITH BILATERAL SALPINGO OOPHORECTOMY;  Surgeon: Everitt Amber, MD;  Location: [facility];  Service: Gynecology;  Laterality: Bilateral;  ? SENTINEL NODE BIOPSY N/A 06/25/2018  ? Procedure: SENTINEL NODE BIOPSY;  Surgeon: Everitt Amber, MD;  Location: [facility];  Service: Gynecology;  Laterality: N/A;  ? TENNIS ELBOW RELEASE/NIRSCHEL PROCEDURE Right 11/28/2016  ? Procedure: RIGHT ELBOW ULNER NERVE RELEASE AND OR TRANSPOSITION;  Surgeon: Iran Planas, MD;  Location: [place];  Service: Orthopedics;  Laterality: Right;  ? ? ?Social History: ? reports that she quit smoking about 28 years ago. Her smoking use included cigarettes. She has a 0.50 pack-year smoking history. She has never used smokeless tobacco. She reports current alcohol use. She reports that she does not use drugs. ? ?Allergies  ?Allergen Reactions  ? Shellfish Allergy Hives, Itching and Swelling  ? Penicillins Hives and Itching  ?  Has patient had a PCN reaction causing immediate rash, facial/tongue/throat swelling, SOB or lightheadedness with hypotension: Yes ?Has patient had a PCN reaction causing severe rash involving mucus membranes or skin necrosis: Yes ?Has patient had a PCN reaction that required hospitalization: Yes ?Has patient had a PCN reaction occurring within the last 10 years: No ?If all of the above answers are "NO", then may proceed with Cephalosporin use. ?  ? ? ?Family History  ?Problem Relation Age of Onset  ? Allergies Mother   ? Asthma Mother   ? Lung cancer Mother   ? Allergies Sister   ? Allergies Brother   ? Asthma Brother   ? Asthma Sister   ? Sarcoidosis Neg Hx   ? Colon cancer Neg Hx   ? Liver cancer Neg Hx   ? Stomach cancer Neg Hx   ? Esophageal cancer Neg Hx   ? Rectal cancer Neg Hx    ? ? ? ?Prior to Admission medications   ?Medication Sig Start Date End Date Taking? Authorizing Provider  ?acetaminophen (TYLENOL) 500 MG tablet Take 500 mg by mouth every 6 (six) hours as needed for mild pain.    [provider]  ?albuterol (PROAIR HFA) 108 (90 Base) MCG/ACT inhaler 2 puffs every 4 hours as needed only  if your can't catch your breath ?Patient taking differently: Inhale 2 puffs into the lungs every 4 (four) hours as needed for wheezing or shortness of breath. 10/31/21   Tanda Rockers, MD  ?aspirin 81 MG EC tablet Take 1 tablet (81 mg total) by mouth daily. Swallow whole. 02/19/22 05/20/22  Barb Merino, MD  ?atorvastatin (LIPITOR) 80 MG tablet Take 1 tablet (80 mg total) by mouth daily. 02/19/22 05/20/22  Barb Merino, MD  ?bisoprolol (ZEBETA) 10 MG tablet Take 1 tablet (10 mg total) by mouth in the morning and at bedtime. 12/12/21   [name], MD  ?cetirizine (  ALLERGY, CETIRIZINE,) 10 MG tablet Take 1 tablet (10 mg total) by mouth daily. 02/15/22 03/17/22  Leath-Warren, Alda Lea, NP  ?clopidogrel (PLAVIX) 75 MG tablet Take 1 tablet (75 mg total) by mouth daily for 21 days. 02/18/22 03/11/22  Barb Merino, MD  ?clotrimazole-betamethasone (LOTRISONE) cream Apply to affected area daily for 14 days. 02/27/22 03/13/22  [name], [name], MD  ?desonide (DESOWEN) 0.05 % lotion Apply topically 2 (two) times daily. Continue following with dermatologist ?Patient taking differently: Apply 1 application. topically 2 (two) times daily as needed (skin irritation). Continue following with dermatologist 09/29/19   [name], [name], MD  ?diclofenac Sodium (VOLTAREN) 1 % GEL Apply 2 g topically 4 (four) times daily. 02/27/22 03/29/22  [name], [name], MD  ?diphenhydrAMINE (BENADRYL) 25 MG tablet Take 25 mg by mouth every 6 (six) hours as needed for allergies.    [provider]  ?famotidine (PEPCID) 20 MG tablet Take 1 tablet (20 mg total) by mouth daily for 10 days. 02/15/22 03/17/22  Leath-Warren, Alda Lea, NP  ?hydrochlorothiazide (HYDRODIURIL) 25 MG tablet Take 1 tablet (25 mg total) by mouth daily. 12/04/21   [name], MD  ?ketoconazole (NIZORAL) 2 % cream Apply to affected area daily as needed for irritati

## 2022-03-09 NOTE — ED Provider Notes (Signed)
?[name] DEPT ?Provider Note ? ? ?CSN: [id number] ?Arrival date & time: 03/09/22  2009 ? ?  ? ?History ? ?Chief Complaint  ?Patient presents with  ? Shortness of Breath  ? ? ?[name] is a 66 y.o. female. ? ?Patient presents to ER chief complaint of cough congestion shortness of breath and chest tightness.  Symptoms ongoing for the past 5 days now.  History is complicated with a history of pulmonary sarcoidosis, endometrial cancer with lung metastases.  Patient otherwise denies any fevers denies any vomiting or diarrhea. ? ? ?  ? ?Home Medications ?Prior to Admission medications   ?Medication Sig Start Date End Date Taking? Authorizing Provider  ?acetaminophen (TYLENOL) 500 MG tablet Take 500 mg by mouth every 6 (six) hours as needed for mild pain.    [provider]  ?albuterol (PROAIR HFA) 108 (90 Base) MCG/ACT inhaler 2 puffs every 4 hours as needed only  if your can't catch your breath ?Patient taking differently: Inhale 2 puffs into the lungs every 4 (four) hours as needed for wheezing or shortness of breath. 10/31/21   Tanda Rockers, MD  ?aspirin 81 MG EC tablet Take 1 tablet (81 mg total) by mouth daily. Swallow whole. 02/19/22 05/20/22  Barb Merino, MD  ?atorvastatin (LIPITOR) 80 MG tablet Take 1 tablet (80 mg total) by mouth daily. 02/19/22 05/20/22  Barb Merino, MD  ?bisoprolol (ZEBETA) 10 MG tablet Take 1 tablet (10 mg total) by mouth in the morning and at bedtime. 12/12/21   [name], MD  ?cetirizine (ALLERGY, CETIRIZINE,) 10 MG tablet Take 1 tablet (10 mg total) by mouth daily. 02/15/22 03/17/22  Leath-Warren, Alda Lea, NP  ?clopidogrel (PLAVIX) 75 MG tablet Take 1 tablet (75 mg total) by mouth daily for 21 days. 02/18/22 03/11/22  Barb Merino, MD  ?clotrimazole-betamethasone (LOTRISONE) cream Apply to affected area daily for 14 days. 02/27/22 03/13/22  [name], [name], MD  ?desonide (DESOWEN) 0.05 % lotion Apply topically 2 (two) times daily. Continue  following with dermatologist ?Patient taking differently: Apply 1 application. topically 2 (two) times daily as needed (skin irritation). Continue following with dermatologist 09/29/19   [name], [name], MD  ?diclofenac Sodium (VOLTAREN) 1 % GEL Apply 2 g topically 4 (four) times daily. 02/27/22 03/29/22  [name], [name], MD  ?diphenhydrAMINE (BENADRYL) 25 MG tablet Take 25 mg by mouth every 6 (six) hours as needed for allergies.    [provider]  ?famotidine (PEPCID) 20 MG tablet Take 1 tablet (20 mg total) by mouth daily for 10 days. 02/15/22 03/17/22  Leath-Warren, Alda Lea, NP  ?hydrochlorothiazide (HYDRODIURIL) 25 MG tablet Take 1 tablet (25 mg total) by mouth daily. 12/04/21   [name], MD  ?ketoconazole (NIZORAL) 2 % cream Apply to affected area daily as needed for irritation. 02/27/22   [name], [name], MD  ?levothyroxine (SYNTHROID) 100 MCG tablet Take 1 tablet (100 mcg total) by mouth daily before breakfast. 12/21/21   [name], MD  ?lidocaine-prilocaine (EMLA) cream Apply 1 application topically as needed. 09/25/21   [name], MD  ?loperamide (IMODIUM) 2 MG capsule Take 2 mg by mouth daily as needed for diarrhea or loose stools.    [provider]  ?loratadine (CLARITIN) 10 MG tablet Take 10 mg by mouth daily as needed for allergies.    [provider]  ?mometasone-formoterol (DULERA) 100-5 MCG/ACT AERO Take 2 puffs first thing in am and then another 2 puffs about 12 hours later. ?Patient taking  differently: 2 puffs 2 (two) times daily as needed for wheezing or shortness of breath. 10/31/21   Tanda Rockers, MD  ?ondansetron (ZOFRAN) 8 MG tablet Take 1 tablet (8 mg total) by mouth every 8 (eight) hours as needed for nausea. 05/19/20   [name], MD  ?Oxycodone HCl 10 MG TABS Take 1 tablet (10 mg total) by mouth every 4 (four) hours as needed for severe pain. 03/05/22   [name], MD  ?prochlorperazine (COMPAZINE) 10 MG tablet TAKE 1 TABLET BY MOUTH EVERY 6 HOURS AS NEEDED  FOR NAUSEA OR VOMITING ?Patient taking differently: Take 10 mg by mouth every 6 (six) hours as needed for nausea or vomiting. 02/02/21 02/17/22  [name], MD  ?triamcinolone (NASACORT) 55 MCG/ACT AERO nasal inhaler Place 2 sprays into each nostril daily. ?Patient taking differently: Place 2 sprays into the nose 2 (two) times daily as needed (congestion). 11/22/21   [name], MD  ?vitamin C (ASCORBIC ACID) 500 MG tablet Take 500 mg by mouth daily.    [provider]  ?   ? ?Allergies    ?Shellfish allergy and Penicillins   ? ?Review of Systems   ?Review of Systems  ?Constitutional:  Negative for fever.  ?HENT:  Negative for ear pain.   ?Eyes:  Negative for pain.  ?Respiratory:  Positive for cough and shortness of breath.   ?Cardiovascular:  Negative for chest pain.  ?Gastrointestinal:  Negative for abdominal pain.  ?Genitourinary:  Negative for flank pain.  ?Musculoskeletal:  Negative for back pain.  ?Skin:  Negative for rash.  ?Neurological:  Negative for headaches.  ? ?Physical Exam ?Updated Vital Signs ?BP (!) 158/91   Pulse 100   Temp 98.2 ?F (36.8 ?C) (Oral)   Resp (!) 28   LMP 12/17/2003   SpO2 91%  ?Physical Exam ?Constitutional:   ?   General: She is not in acute distress. ?   Appearance: Normal appearance.  ?HENT:  ?   Head: Normocephalic.  ?   Nose: Nose normal.  ?Eyes:  ?   Extraocular Movements: Extraocular movements intact.  ?Cardiovascular:  ?   Rate and Rhythm: Normal rate.  ?Pulmonary:  ?   Effort: Tachypnea and accessory muscle usage present.  ?   Breath sounds: Rhonchi present.  ?Musculoskeletal:     ?   General: Normal range of motion.  ?   Cervical back: Normal range of motion.  ?Neurological:  ?   General: No focal deficit present.  ?   Mental Status: She is alert. Mental status is at baseline.  ? ? ?ED Results / Procedures / Treatments   ?Labs ?(all labs ordered are listed, but only abnormal results are displayed) ?Labs Reviewed  ?CBC WITH DIFFERENTIAL/PLATELET - Abnormal;  Notable for the following components:  ?    Result Value  ? RBC 3.29 (*)   ? Hemoglobin 11.1 (*)   ? HCT 32.5 (*)   ? All other components within normal limits  ?COMPREHENSIVE METABOLIC PANEL - Abnormal; Notable for the following components:  ? Sodium 133 (*)   ? Glucose, Bld 141 (*)   ? Calcium 8.7 (*)   ? All other components within normal limits  ?D-DIMER, QUANTITATIVE - Abnormal; Notable for the following components:  ? D-Dimer, Quant 0.96 (*)   ? All other components within normal limits  ?CULTURE, BLOOD (ROUTINE X 2)  ?CULTURE, BLOOD (ROUTINE X 2)  ?RESP PANEL BY RT-PCR (FLU A&B, COVID) ARPGX2  ?BRAIN NATRIURETIC PEPTIDE  ?TROPONIN  I (HIGH SENSITIVITY)  ?TROPONIN I (HIGH SENSITIVITY)  ? ? ?EKG ?None ? ?Radiology ?DG Chest Port 1 View ? ?Result Date: 03/09/2022 ?CLINICAL DATA:  Shortness of breath. EXAM: PORTABLE CHEST 1 VIEW COMPARISON:  Chest radiograph dated 11/12/2021. FINDINGS: Right-sided Port-A-Cath with tip at the cavoatrial junction. Right lung base opacity may represent combination of a small pleural effusion and associated atelectasis or infiltrate. Faint nodular density in the left mid lung field likely corresponds to the nodule seen on the prior CT. Bilateral hilar fullness. Atherosclerotic calcification of the aorta. No acute osseous pathology. IMPRESSION: 1. Right lung base opacity may represent combination of a small pleural effusion and associated atelectasis or infiltrate. 2. Bilateral hilar fullness in keeping with adenopathy. Electronically Signed   By: Anner Crete M.D.   On: 03/09/2022 21:10   ? ?Procedures ?Marland KitchenCritical Care ?Performed by: Luna Fuse, MD ?Authorized by: Luna Fuse, MD  ? ?Critical care provider statement:  ?  Critical care time (minutes):  40 ?  Critical care time was exclusive of:  Separately billable procedures and treating other patients and teaching time ?  Critical care was necessary to treat or prevent imminent or life-threatening deterioration of the  following conditions:  Respiratory failure  ? ? ?Medications Ordered in ED ?Medications  ?cefTRIAXone (ROCEPHIN) 1 g in sodium chloride 0.9 % 100 mL IVPB (1 g Intravenous New Bag/Given 03/09/22 2238)  ?azithromycin (ZITHROMA

## 2022-03-09 NOTE — ED Notes (Signed)
Confirmed with lab tech they have received blood samples previously collected. Per lab tech, to run labs at this time.  ?

## 2022-03-09 NOTE — ED Notes (Signed)
Pt stating between 90-91% on room air. Was placed on 1 L of supplemental oxygen. SpO2 increased to 94%.  ?

## 2022-03-09 NOTE — ED Notes (Signed)
ED TO INPATIENT HANDOFF REPORT ? ?ED Nurse Name and Phone #: Mel Almond RN  ? ?S ?Name/Age/Gender ?[name] ?66 y.o. ?female ?Room/Bed: WA13/WA13 ? ?Code Status ?  Code Status: Prior ? ?Home/SNF/Other ?Home ?Patient oriented to: self, place, time, and situation ?Is this baseline? Yes  ? ?Triage Complete: Triage complete  ?Chief Complaint ?Acute respiratory failure with hypoxia ([place]) [J96.01] ? ?Triage Note ?Patient has been short of breath all day. Patient has stage 4 endometrial cancer, last treatment Tuesday. Patient short of breath even sitting. Presents with cough, nonproductive. Dry cough.  ? ?Allergies ?Allergies  ?Allergen Reactions  ? Shellfish Allergy Hives, Itching and Swelling  ? Penicillins Hives and Itching  ?  Has patient had a PCN reaction causing immediate rash, facial/tongue/throat swelling, SOB or lightheadedness with hypotension: Yes ?Has patient had a PCN reaction causing severe rash involving mucus membranes or skin necrosis: Yes ?Has patient had a PCN reaction that required hospitalization: Yes ?Has patient had a PCN reaction occurring within the last 10 years: No ?If all of the above answers are "NO", then may proceed with Cephalosporin use. ?  ? ? ?Level of Care/Admitting Diagnosis ?ED Disposition   ? ? ED Disposition  ?Admit  ? Condition  ?--  ? Comment  ?Hospital Area: [facility] [[id number]] ? Level of Care: Telemetry [5] ? Admit to tele based on following criteria: Acute CHF ? May place patient in observation at [facility] or [place] if equivalent level of care is available:: No ? Covid Evaluation: Confirmed COVID Negative ? Diagnosis: Acute respiratory failure with hypoxia ([place]) [703500] ? Admitting Physician: Lenore Cordia [[id number]] ? Attending Physician: Lenore Cordia [[id number]] ?  ?  ? ?  ? ? ?B ?Medical/Surgery History ?Past Medical History:  ?Diagnosis Date  ? Bifid uvula   ? Blood transfusion without reported diagnosis   ? Endometrial ca [facility]) dx'd 06/2018  ?  chemo  ? GERD (gastroesophageal reflux disease)   ? History of cervical dysplasia   ? History of chemotherapy   ? 06/ 2005  ? History of cleft lip   ? CORRECTED  ? History of condyloma acuminatum   ? PERIANAL  ? History of radiation therapy   ? completed 06/ 2005  ? History of vulvar dysplasia   ? Hypertension   ? 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 Pulmonary nodule, left   ? 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 PULMOLOGIST-  DR Melvyn Novas--  CHRONIC PREDNISONE  ? Sarcoid uveitis of left eye goes to [facility]  ? intermittantly--  currently no issues per pt 11-27-2016  ? 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 S/P  local resection 03/2004.  ChemoXRT 2005 Dr Collier Salina Rubin/[facility]  ? Upper airway cough syndrome   ? Wears glasses   ? ?Past Surgical History:  ?Procedure Laterality Date  ? BRONCHOSCOPY  01/22/2008  ? w/ Lavage and bronchial bx  ? CARDIOVASCULAR STRESS TEST  06/27/2006  ? normal nuclear study w/ no ischemia/  normal LV function and wall motion , ef 61%  ? CARPAL TUNNEL RELEASE Right 01/17/2016  ? and finger trigger release  ? CLEFT LIP REPAIR    ? CO2 LASER ABLATION VULVA    ? EUA/  EXCISIONAL MULTIPLE PERINEAL/ANAL BX'S/  SIGMOIDSCOPY/  PORT-A-CATH PLACEMENT  03/27/2004  ? Local excision - Dr Lennie Hummer  ? IR IMAGING GUIDED PORT INSERTION  05/26/2020  ? LAPAROSCOPIC CHOLECYSTECTOMY  08/14/2008  ? MULTIPLE  ANAL BX'S  10/10/2006  ? RELEASE A1 PULLEY THUMB Bilateral left 12-17-2006/  right 09-05-2009  ? REMOVAL PORT-A-CATH/  EUA/  MULTIPLE ANAL BX'S  10/16/2004  ? ROBOTIC ASSISTED TOTAL HYSTERECTOMY WITH BILATERAL SALPINGO OOPHERECTOMY Bilateral 06/25/2018  ? Procedure: XI ROBOTIC ASSISTED TOTAL HYSTERECTOMY WITH BILATERAL SALPINGO OOPHORECTOMY;  Surgeon: Everitt Amber, MD;  Location: [facility];  Service: Gynecology;  Laterality: Bilateral;  ? SENTINEL NODE BIOPSY N/A 06/25/2018  ? Procedure: SENTINEL NODE BIOPSY;  Surgeon: Everitt Amber, MD;  Location: [facility];  Service: Gynecology;  Laterality: N/A;  ?  TENNIS ELBOW RELEASE/NIRSCHEL PROCEDURE Right 11/28/2016  ? Procedure: RIGHT ELBOW ULNER NERVE RELEASE AND OR TRANSPOSITION;  Surgeon: Iran Planas, MD;  Location: [place];  Service: Orthopedics;  Laterality: Right;  ?  ? ?A ?IV Location/Drains/Wounds ?Patient Lines/Drains/Airways Status   ? ? Active Line/Drains/Airways   ? ? Name Placement date Placement time Site Days  ? Implanted Port 05/26/20 Right Chest 05/26/20  1158  Chest  652  ? Incision (Closed) 11/28/16 Elbow Right 11/28/16  1645  -- 1927  ? Incision - 5 Ports Abdomen Right;Lateral Right;Medial Left;Medial Left;Lateral Left;Upper 06/25/18  1110  -- 1353  ? ?  ?  ? ?  ? ? ?Intake/Output Last 24 hours ?No intake or output data in the 24 hours ending 03/09/22 2320 ? ?Labs/Imaging ?Results for orders placed or performed during the hospital encounter of 03/09/22 (from the past 48 hour(s))  ?Resp Panel by RT-PCR (Flu A&B, Covid) Nasopharyngeal Swab     Status: None  ? Collection Time: 03/09/22  8:44 PM  ? Specimen: Nasopharyngeal Swab; Nasopharyngeal(NP) swabs in vial transport medium  ?Result Value Ref Range  ? SARS Coronavirus 2 by RT PCR NEGATIVE NEGATIVE  ?  Comment: (NOTE) ?SARS-CoV-2 target nucleic acids are NOT DETECTED. ? ?The SARS-CoV-2 RNA is generally detectable in upper respiratory ?specimens during the acute phase of infection. The lowest ?concentration of SARS-CoV-2 viral copies this assay can detect is ?138 copies/mL. A negative result does not preclude SARS-Cov-2 ?infection and should not be used as the sole basis for treatment or ?other patient management decisions. A negative result may occur with  ?improper specimen collection/handling, submission of specimen other ?than nasopharyngeal swab, presence of viral mutation(s) within the ?areas targeted by this assay, and inadequate number of viral ?copies(<138 copies/mL). A negative result must be combined with ?clinical observations, patient history, and  epidemiological ?information. The expected result is Negative. ? ?Fact Sheet for Patients:  ?EntrepreneurPulse.com.au ? ?Fact Sheet for Healthcare Providers:  ?IncredibleEmployment.be ? ?This test is no t yet approved or cleared by the [place] FDA and  ?has been authorized for detection and/or diagnosis of SARS-CoV-2 by ?FDA under an Emergency Use Authorization (EUA). This EUA will remain  ?in effect (meaning this test can be used) for the duration of the ?COVID-19 declaration under Section 564(b)(1) of the Act, 21 ?U.S.C.section 360bbb-3(b)(1), unless the authorization is terminated  ?or revoked sooner.  ? ? ?  ? Influenza A by PCR NEGATIVE NEGATIVE  ? Influenza B by PCR NEGATIVE NEGATIVE  ?  Comment: (NOTE) ?The Xpert Xpress SARS-CoV-2/FLU/RSV plus assay is intended as an aid ?in the diagnosis of influenza from Nasopharyngeal swab specimens and ?should not be used as a sole basis for treatment. Nasal washings and ?aspirates are unacceptable for Xpert Xpress SARS-CoV-2/FLU/RSV ?testing. ? ?Fact Sheet for Patients: ?EntrepreneurPulse.com.au ? ?Fact Sheet for Healthcare Providers: ?IncredibleEmployment.be ? ?This test is not yet approved or cleared by the  [place] [employer] and ?has been authorized for detection and/or diagnosis of SARS-CoV-2 by ?FDA under an Emergency Use Authorization (EUA). This EUA will remain ?in effect (meaning this test can be used) for the duration of the ?COVID-19 declaration under Section 564(b)(1) of the Act, 21 U.S.C. ?section 360bbb-3(b)(1), unless the authorization is terminated or ?revoked. ? ?Performed at [facility], [address] ?[place], [place] [address] ?  ?CBC with Differential     Status: Abnormal  ? Collection Time: 03/09/22  9:20 PM  ?Result Value Ref Range  ? WBC 9.4 4.0 - 10.5 K/uL  ? RBC 3.29 (L) 3.87 - 5.11 MIL/uL  ? Hemoglobin 11.1 (L) 12.0 - 15.0 g/dL  ? HCT 32.5 (L) 36.0 -  46.0 %  ? MCV 98.8 80.0 - 100.0 fL  ? MCH 33.7 26.0 - 34.0 pg  ? MCHC 34.2 30.0 - 36.0 g/dL  ? RDW 14.8 11.5 - 15.5 %  ? Platelets 175 150 - 400 K/uL  ? nRBC 0.0 0.0 - 0.2 %  ? Neutrophils Relative % 77 %  ? Neutro Abs 7

## 2022-03-10 ENCOUNTER — Observation Stay (HOSPITAL_COMMUNITY): Payer: Medicare HMO

## 2022-03-10 ENCOUNTER — Encounter (HOSPITAL_COMMUNITY): Payer: Self-pay

## 2022-03-10 DIAGNOSIS — E039 Hypothyroidism, unspecified: Secondary | ICD-10-CM | POA: Diagnosis present

## 2022-03-10 DIAGNOSIS — C7802 Secondary malignant neoplasm of left lung: Secondary | ICD-10-CM | POA: Diagnosis present

## 2022-03-10 DIAGNOSIS — Z79899 Other long term (current) drug therapy: Secondary | ICD-10-CM | POA: Diagnosis not present

## 2022-03-10 DIAGNOSIS — R609 Edema, unspecified: Secondary | ICD-10-CM | POA: Diagnosis not present

## 2022-03-10 DIAGNOSIS — R0602 Shortness of breath: Secondary | ICD-10-CM | POA: Diagnosis not present

## 2022-03-10 DIAGNOSIS — C541 Malignant neoplasm of endometrium: Secondary | ICD-10-CM | POA: Diagnosis not present

## 2022-03-10 DIAGNOSIS — Z7951 Long term (current) use of inhaled steroids: Secondary | ICD-10-CM | POA: Diagnosis not present

## 2022-03-10 DIAGNOSIS — J9601 Acute respiratory failure with hypoxia: Secondary | ICD-10-CM | POA: Diagnosis not present

## 2022-03-10 DIAGNOSIS — E782 Mixed hyperlipidemia: Secondary | ICD-10-CM | POA: Diagnosis present

## 2022-03-10 DIAGNOSIS — Z20822 Contact with and (suspected) exposure to covid-19: Secondary | ICD-10-CM | POA: Diagnosis present

## 2022-03-10 DIAGNOSIS — C7800 Secondary malignant neoplasm of unspecified lung: Secondary | ICD-10-CM | POA: Diagnosis present

## 2022-03-10 DIAGNOSIS — R7989 Other specified abnormal findings of blood chemistry: Secondary | ICD-10-CM | POA: Diagnosis not present

## 2022-03-10 DIAGNOSIS — D849 Immunodeficiency, unspecified: Secondary | ICD-10-CM | POA: Diagnosis present

## 2022-03-10 DIAGNOSIS — Z8673 Personal history of transient ischemic attack (TIA), and cerebral infarction without residual deficits: Secondary | ICD-10-CM | POA: Diagnosis not present

## 2022-03-10 DIAGNOSIS — C775 Secondary and unspecified malignant neoplasm of intrapelvic lymph nodes: Secondary | ICD-10-CM | POA: Diagnosis present

## 2022-03-10 DIAGNOSIS — Z8542 Personal history of malignant neoplasm of other parts of uterus: Secondary | ICD-10-CM | POA: Diagnosis not present

## 2022-03-10 DIAGNOSIS — D860 Sarcoidosis of lung: Secondary | ICD-10-CM | POA: Diagnosis present

## 2022-03-10 DIAGNOSIS — Z882 Allergy status to sulfonamides status: Secondary | ICD-10-CM | POA: Diagnosis not present

## 2022-03-10 DIAGNOSIS — Z7982 Long term (current) use of aspirin: Secondary | ICD-10-CM | POA: Diagnosis not present

## 2022-03-10 DIAGNOSIS — Z923 Personal history of irradiation: Secondary | ICD-10-CM | POA: Diagnosis not present

## 2022-03-10 DIAGNOSIS — C7801 Secondary malignant neoplasm of right lung: Secondary | ICD-10-CM | POA: Diagnosis not present

## 2022-03-10 DIAGNOSIS — Z880 Allergy status to penicillin: Secondary | ICD-10-CM | POA: Diagnosis not present

## 2022-03-10 DIAGNOSIS — Z90710 Acquired absence of both cervix and uterus: Secondary | ICD-10-CM | POA: Diagnosis not present

## 2022-03-10 DIAGNOSIS — I10 Essential (primary) hypertension: Secondary | ICD-10-CM | POA: Diagnosis present

## 2022-03-10 DIAGNOSIS — Z87891 Personal history of nicotine dependence: Secondary | ICD-10-CM | POA: Diagnosis not present

## 2022-03-10 DIAGNOSIS — Z7902 Long term (current) use of antithrombotics/antiplatelets: Secondary | ICD-10-CM | POA: Diagnosis not present

## 2022-03-10 DIAGNOSIS — J90 Pleural effusion, not elsewhere classified: Secondary | ICD-10-CM | POA: Diagnosis not present

## 2022-03-10 DIAGNOSIS — Z79890 Hormone replacement therapy (postmenopausal): Secondary | ICD-10-CM | POA: Diagnosis not present

## 2022-03-10 DIAGNOSIS — J189 Pneumonia, unspecified organism: Secondary | ICD-10-CM | POA: Diagnosis present

## 2022-03-10 DIAGNOSIS — C786 Secondary malignant neoplasm of retroperitoneum and peritoneum: Secondary | ICD-10-CM | POA: Diagnosis present

## 2022-03-10 LAB — CBC
HCT: 30.6 % — ABNORMAL LOW (ref 36.0–46.0)
Hemoglobin: 10.7 g/dL — ABNORMAL LOW (ref 12.0–15.0)
MCH: 34.3 pg — ABNORMAL HIGH (ref 26.0–34.0)
MCHC: 35 g/dL (ref 30.0–36.0)
MCV: 98.1 fL (ref 80.0–100.0)
Platelets: 162 10*3/uL (ref 150–400)
RBC: 3.12 MIL/uL — ABNORMAL LOW (ref 3.87–5.11)
RDW: 14.9 % (ref 11.5–15.5)
WBC: 8.7 10*3/uL (ref 4.0–10.5)
nRBC: 0 % (ref 0.0–0.2)

## 2022-03-10 LAB — BASIC METABOLIC PANEL
Anion gap: 8 (ref 5–15)
BUN: 15 mg/dL (ref 8–23)
CO2: 28 mmol/L (ref 22–32)
Calcium: 8.6 mg/dL — ABNORMAL LOW (ref 8.9–10.3)
Chloride: 97 mmol/L — ABNORMAL LOW (ref 98–111)
Creatinine, Ser: 0.79 mg/dL (ref 0.44–1.00)
GFR, Estimated: >60 mL/min (ref >60)
Glucose, Bld: 138 mg/dL — ABNORMAL HIGH (ref 70–99)
Potassium: 4 mmol/L (ref 3.5–5.1)
Sodium: 133 mmol/L — ABNORMAL LOW (ref 135–145)

## 2022-03-10 LAB — TROPONIN I (HIGH SENSITIVITY): Troponin I (High Sensitivity): 8 ng/L (ref <18)

## 2022-03-10 MED ORDER — CLOPIDOGREL BISULFATE 75 MG PO TABS
75.0000 mg | ORAL_TABLET | Freq: Every day | ORAL | Status: DC
  Administered 2022-03-10: 75 mg via ORAL
  Filled 2022-03-10: qty 1

## 2022-03-10 MED ORDER — ACETAMINOPHEN 325 MG PO TABS
650.0000 mg | ORAL_TABLET | Freq: Four times a day (QID) | ORAL | Status: DC | PRN
Start: 2022-03-10 — End: 2022-03-12
  Administered 2022-03-11 – 2022-03-12 (×2): 650 mg via ORAL
  Filled 2022-03-10 (×2): qty 2

## 2022-03-10 MED ORDER — MOMETASONE FURO-FORMOTEROL FUM 100-5 MCG/ACT IN AERO
2.0000 | INHALATION_SPRAY | Freq: Two times a day (BID) | RESPIRATORY_TRACT | Status: DC
  Administered 2022-03-10 – 2022-03-12 (×5): 2 via RESPIRATORY_TRACT
  Filled 2022-03-10: qty 8.8

## 2022-03-10 MED ORDER — BISOPROLOL FUMARATE 5 MG PO TABS
10.0000 mg | ORAL_TABLET | Freq: Two times a day (BID) | ORAL | Status: DC
  Administered 2022-03-10 – 2022-03-12 (×6): 10 mg via ORAL
  Filled 2022-03-10 (×7): qty 2

## 2022-03-10 MED ORDER — SENNOSIDES-DOCUSATE SODIUM 8.6-50 MG PO TABS: 1.0000 | ORAL_TABLET | Freq: Every evening | ORAL | Status: DC | PRN

## 2022-03-10 MED ORDER — SODIUM CHLORIDE 0.9% FLUSH: 3.0000 mL | Freq: Two times a day (BID) | INTRAVENOUS | Status: DC

## 2022-03-10 MED ORDER — LEVOTHYROXINE SODIUM 100 MCG PO TABS
100.0000 ug | ORAL_TABLET | Freq: Every day | ORAL | Status: DC
  Administered 2022-03-10 – 2022-03-12 (×3): 100 ug via ORAL
  Filled 2022-03-10 (×3): qty 1

## 2022-03-10 MED ORDER — ATORVASTATIN CALCIUM 40 MG PO TABS
80.0000 mg | ORAL_TABLET | Freq: Every day | ORAL | Status: DC
  Administered 2022-03-10 – 2022-03-12 (×3): 80 mg via ORAL
  Filled 2022-03-10 (×3): qty 2

## 2022-03-10 MED ORDER — ONDANSETRON HCL 4 MG PO TABS: 4.0000 mg | ORAL_TABLET | Freq: Four times a day (QID) | ORAL | Status: DC | PRN

## 2022-03-10 MED ORDER — SODIUM CHLORIDE 0.9% FLUSH: 10.0000 mL | INTRAVENOUS | Status: DC | PRN

## 2022-03-10 MED ORDER — SODIUM CHLORIDE 0.9% FLUSH: 10.0000 mL | Freq: Two times a day (BID) | INTRAVENOUS | Status: DC

## 2022-03-10 MED ORDER — SODIUM CHLORIDE 0.9 % IV SOLN
500.0000 mg | INTRAVENOUS | Status: DC
  Administered 2022-03-10: 500 mg via INTRAVENOUS
  Filled 2022-03-10: qty 5

## 2022-03-10 MED ORDER — HYDROCHLOROTHIAZIDE 25 MG PO TABS
25.0000 mg | ORAL_TABLET | Freq: Every day | ORAL | Status: DC
  Administered 2022-03-10 – 2022-03-12 (×3): 25 mg via ORAL
  Filled 2022-03-10 (×3): qty 1

## 2022-03-10 MED ORDER — LORATADINE 10 MG PO TABS: 10.0000 mg | ORAL_TABLET | Freq: Every day | ORAL | Status: DC | PRN

## 2022-03-10 MED ORDER — FAMOTIDINE 20 MG PO TABS
20.0000 mg | ORAL_TABLET | Freq: Every day | ORAL | Status: DC
  Administered 2022-03-10 – 2022-03-12 (×3): 20 mg via ORAL
  Filled 2022-03-10 (×3): qty 1

## 2022-03-10 MED ORDER — HYDROCODONE BIT-HOMATROP MBR 5-1.5 MG/5ML PO SOLN
5.0000 mL | Freq: Four times a day (QID) | ORAL | Status: DC | PRN
  Administered 2022-03-10: 5 mL via ORAL
  Filled 2022-03-10: qty 5

## 2022-03-10 MED ORDER — SODIUM CHLORIDE 0.9 % IV SOLN: 500.0000 mg | INTRAVENOUS | Status: DC

## 2022-03-10 MED ORDER — ASPIRIN EC 81 MG PO TBEC
81.0000 mg | DELAYED_RELEASE_TABLET | Freq: Every day | ORAL | Status: DC
  Administered 2022-03-10 – 2022-03-12 (×3): 81 mg via ORAL
  Filled 2022-03-10 (×3): qty 1

## 2022-03-10 MED ORDER — ACETAMINOPHEN 650 MG RE SUPP
650.0000 mg | Freq: Four times a day (QID) | RECTAL | Status: DC | PRN
Start: 2022-03-10 — End: 2022-03-12

## 2022-03-10 MED ORDER — SODIUM CHLORIDE 0.9 % IV SOLN: 2.0000 g | INTRAVENOUS | Status: DC

## 2022-03-10 MED ORDER — SENNOSIDES-DOCUSATE SODIUM 8.6-50 MG PO TABS
1.0000 | ORAL_TABLET | Freq: Every day | ORAL | Status: DC | PRN
  Administered 2022-03-10: 1 via ORAL
  Filled 2022-03-10: qty 1

## 2022-03-10 MED ORDER — IOHEXOL 350 MG/ML SOLN
75.0000 mL | Freq: Once | INTRAVENOUS | Status: AC | PRN
  Administered 2022-03-10: 75 mL via INTRAVENOUS

## 2022-03-10 MED ORDER — ALBUTEROL SULFATE (2.5 MG/3ML) 0.083% IN NEBU
2.5000 mg | INHALATION_SOLUTION | RESPIRATORY_TRACT | Status: DC | PRN
  Administered 2022-03-11: 2.5 mg via RESPIRATORY_TRACT
  Filled 2022-03-10: qty 3

## 2022-03-10 MED ORDER — CHLORHEXIDINE GLUCONATE CLOTH 2 % EX PADS
6.0000 | MEDICATED_PAD | Freq: Every day | CUTANEOUS | Status: DC
  Administered 2022-03-10 – 2022-03-12 (×3): 6 via TOPICAL

## 2022-03-10 MED ORDER — DIPHENHYDRAMINE HCL 25 MG PO CAPS
25.0000 mg | ORAL_CAPSULE | Freq: Every day | ORAL | Status: DC
  Administered 2022-03-10 – 2022-03-11 (×2): 25 mg via ORAL
  Filled 2022-03-10 (×2): qty 1

## 2022-03-10 MED ORDER — ENOXAPARIN SODIUM 40 MG/0.4ML IJ SOSY
40.0000 mg | PREFILLED_SYRINGE | INTRAMUSCULAR | Status: DC
  Administered 2022-03-10 – 2022-03-12 (×3): 40 mg via SUBCUTANEOUS
  Filled 2022-03-10 (×3): qty 0.4

## 2022-03-10 MED ORDER — SODIUM CHLORIDE 0.9 % IV SOLN
2.0000 g | INTRAVENOUS | Status: DC
  Administered 2022-03-10 – 2022-03-11 (×2): 2 g via INTRAVENOUS
  Filled 2022-03-10 (×2): qty 20

## 2022-03-10 MED ORDER — LOPERAMIDE HCL 2 MG PO CAPS: 2.0000 mg | ORAL_CAPSULE | Freq: Every day | ORAL | Status: DC | PRN

## 2022-03-10 MED ORDER — ONDANSETRON HCL 4 MG/2ML IJ SOLN: 4.0000 mg | Freq: Four times a day (QID) | INTRAMUSCULAR | Status: DC | PRN

## 2022-03-10 MED ORDER — GUAIFENESIN ER 600 MG PO TB12
600.0000 mg | ORAL_TABLET | Freq: Two times a day (BID) | ORAL | Status: DC
  Administered 2022-03-10 – 2022-03-12 (×6): 600 mg via ORAL
  Filled 2022-03-10 (×6): qty 1

## 2022-03-10 MED ORDER — OXYCODONE HCL 5 MG PO TABS: 10.0000 mg | ORAL_TABLET | ORAL | Status: DC | PRN

## 2022-03-10 NOTE — Assessment & Plan Note (Signed)
Patient previously on chronic prednisone therapy which was titrated off last year prior to initiation of immunotherapy.  Lower suspicion for sarcoid flare at this time but low threshold to add steroids back pending clinical progress. ?

## 2022-03-10 NOTE — Assessment & Plan Note (Signed)
Continue Synthroid °

## 2022-03-10 NOTE — Assessment & Plan Note (Addendum)
Patient reports 5 days of respiratory symptoms since last chemotherapy infusion with carboplatin.  CXR shows a right lung base opacity.  SPO2 down to 88% on room air, currently stable on 2 L via [place].  D-dimer elevated.  She does have known pulmonary metastases and is immunosuppressed with ongoing chemotherapy. ?-Obtain CTA chest to assess for PE and right lung base opacity ?-Continue empiric antibiotics with IV ceftriaxone and azithromycin ?-Follow blood cultures, strep pneumonia and Legionella urinary antigens ?-Continue supplemental oxygen as needed ?-Continue Dulera and albuterol as needed ?

## 2022-03-10 NOTE — Assessment & Plan Note (Signed)
Hemoglobin stable without obvious bleeding.  Continue to monitor. ?

## 2022-03-10 NOTE — Progress Notes (Signed)
?PROGRESS NOTE ?[name]  YQM:[id number] DOB: Jul 08, 1956 DOA: 03/09/2022 ?PCP: [name], [name], MD  ? ?Brief Narrative/Hospital Course: ?66 y.o. 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 ?In the ED vitals with tachycardia tachypnea afebrile, hypoxic up to 88% on room air needing 2 L oxygen Labs showed stable WBC count, stable renal function and normal LFTs 2.8 D-dimer 0.9 COVID-19 negative blood culture ordered chest x-ray right lung base opacity, patient was placed on IV antibiotics supplemental oxygen and admitted.  She had CT of the chest no PE but progressive consolidation within the right lung with moderate effusion which is in part is due to postobstructive changes related to narrowing of the bronchial tree by enlarging hilar adenopathy, a stable left lung nodules and a few new right-sided nodules consistent with progressive disease.  ?  ?Subjective: ?Seen and examined this morning, multiple family at the bedside, patient complains of some cough, constipation.  On 1 to 2 L nasal cannula. ? ?Assessment and Plan: ?Principal Problem: ?  Acute respiratory failure with hypoxia ([place]) ?Active Problems: ?  Sarcoidosis ([place]) ?  Endometrial cancer ([place]) ?  History of CVA (cerebrovascular accident) ?  Essential hypertension ?  Hyperlipidemia, mixed ?  Cancer associated pain ?  Anemia due to antineoplastic chemotherapy ?  Acquired hypothyroidism ?  ?Acute respiratory failure with hypoxia ?Mildly Elevated D-dimer ?Progressive metastatic disease on the CTA ?Postobstructive changes with progressive consolidation right lung ?Moderate pleural effusion: ?We will continue on current  empiric antibiotics likely has postobstructive pneumonia, CT also shows progressive disease which is likely contributing most of her symptoms.  Continue bronchodilators, supplemental oxygen, follow-up culture data urinary antigens. We will get oncology involved tomorrow a.m. ?Recent Labs  ?Lab 03/05/22 ?1141 03/09/22 ?2120 03/10/22 ?0508  ?WBC 10.2 9.4 8.7  ?  ?Stage IV endometrial cancer with metastasis to lung right rectus muscle-now with progressive disease based on CT scan above.  Will have Dr. Johney Maine.  Discussed with family tomorrow. ? ?Recent left MCA territory infarct on 02/17/2022 without residual deficit on aspirin Plavix planned for 21 days then aspirin alone, on Lipitor ? ?Sarcoidosis hx Patient previously on chronic prednisone therapy which was titrated off last year prior to initiation of immunotherapy.  Lower suspicion for sarcoid flare at this time but low threshold to add steroids back pending clinical progress. ? ?Essential hypertension: Stable, cont  HCTZ and bisoprolol ? ?Hypothyroidism cont Synthroid ? ?Anemia due to chemotherapy-stable.  Monitor ?Recent Labs  ?Lab 03/05/22 ?1141 03/09/22 ?2120 03/10/22 ?0508  ?HGB 11.6* 11.1* 10.7*  ?HCT 33.7* 32.5* 30.6*  ?  ?Cancer related pain cont oral opiates and on opiates. ? ?HLD: On Lipitor.  Patient ? ?Class I Obesity:Patient's Body mass index is 32.39 kg/m?. : Will benefit with PCP follow-up, weight loss  healthy lifestyle and outpatient sleep evaluation. ? ? ?DVT prophylaxis: enoxaparin (LOVENOX) injection 40 mg Start: 03/10/22 1000 ?Code Status:   Code Status: Full Code ?Family Communication: plan of care discussed with patient/daughter and multiple family members at bedside. ?Patient status is: Observation but will need at least 2 midnight stay and change to inpatient  level of care: Telemetry  ?Patient currently not stable ? ?Dispo: The patient is from: home ?  Anticipated disposition: home ? ?Mobility Assessment (last 72 hours)   ? ?  Mobility Assessment   ? ? [place] Name 03/10/22 0139  ?  ?  ?  ?  ? Does patient have an order for bedrest or is patient medically unstable No - Continue assessment      ? What is the highest level of mobility based on the progressive mobility assessment? Level 5 (Walks with assist in room/hall) - Balance while stepping forward/back and can walk in room with assist - Complete      ? ?  ?  ? ?  ?  ? ?Objective: ?Vitals last 24 hrs: ?Vitals:  ? 03/10/22 0045 03/10/22 0054 03/10/22 0117 03/10/22 0437  ?BP: (!) 152/85  139/78 120/71  ?Pulse: 89  95 69  ?Resp: '20  16 20  '$ ?Temp:  97.9 ?F (36.6 ?C) 97.6 ?F (36.4 ?C) 98 ?F (36.7 ?C)  ?TempSrc:  Oral Oral Oral  ?SpO2: 95%  94% 97%  ?Weight:   88.3 kg   ?Height:   '5\' 5"'$  (1.651 m)   ? ?Weight change:  ? ?Physical Examination: ?General exam: AA OX3, pleasant,older than stated age, weak appearing. ?HEENT:Oral mucosa moist, Ear/Nose WNL grossly, dentition normal. ?Respiratory system: bilaterally diminished BS, no use of accessory muscle ?Cardiovascular system: S1 & S2 +, No JVD,. ?Gastrointestinal system: Abdomen soft,NT,ND, BS+ ?Nervous System:Alert, awake, moving extremities and grossly nonfocal ?Extremities: LE edema chronic nonpitting, no tenderness,distal peripheral pulses palpable.  ?Skin: No rashes,no icterus. ?MSK: Normal muscle bulk,tone, power ? ?Medications reviewed:  ?Scheduled Meds: ? aspirin EC  81 mg Oral Daily  ? atorvastatin  80 mg Oral Daily  ? bisoprolol  10 mg Oral BID  ? Chlorhexidine Gluconate Cloth  6 each Topical Daily  ? clopidogrel  75 mg Oral Daily  ? enoxaparin (LOVENOX) injection  40 mg Subcutaneous Q24H  ? famotidine  20 mg Oral Daily  ? guaiFENesin  600 mg Oral BID  ? hydrochlorothiazide  25 mg Oral Daily  ? levothyroxine  100 mcg Oral Q0600  ? mometasone-formoterol  2 puff Inhalation BID  ? sodium chloride flush  10-40 mL Intracatheter Q12H  ? sodium chloride flush  3 mL Intravenous Q12H  ? ?Continuous Infusions: ? azithromycin    ? cefTRIAXone  (ROCEPHIN)  IV    ? ? ?  ?Diet Order   ? ?       ?  Diet regular Room service appropriate? Yes; Fluid consistency: Thin  Diet effective now       ?  ? ?  ?  ? ?  ?  ? ?  ?  ?  ? ? ?Intake/Output Summary (Last 24 hours) at 03/10/2022 1146 ?Last data filed at 03/10/2022 1000 ?Gross per 24 hour  ?Intake 1049 ml  ?Output --  ?Net 1049 ml  ? ?Net IO Since Admission: 1,049 mL [03/10/22 1146]  ?Wt Readings from Last 3 Encounters:  ?03/10/22 88.3 kg  ?03/05/22 88.4 kg  ?02/27/22 88.5 kg  ?  ? ?Unresulted Labs (From admission, onward)  ? ?  Start     Ordered  ? 03/10/22 0008  Strep pneumoniae urinary antigen  (COPD / Pneumonia / Cellulitis / Lower Extremity Wound)  Once,   R       ? 03/10/22 0009  ? 03/10/22 0008  Legionella Pneumophila Serogp 1 Ur Ag  (COPD / Pneumonia / Cellulitis / Lower Extremity Wound)  Once,   R       ? 03/10/22 0009  ?  03/09/22 2044  Culture, blood (routine x 2)  BLOOD CULTURE X 2,   STAT     ? 03/09/22 2044  ? ?  ?  ? ?  ?Data Reviewed: I have personally reviewed following labs and imaging studies ?CBC: ?Recent Labs  ?Lab 2022-04-02 ?1141 03/09/22 ?2120 03/10/22 ?0508  ?WBC 10.2 9.4 8.7  ?NEUTROABS 7.8* 7.2  --   ?HGB 11.6* 11.1* 10.7*  ?HCT 33.7* 32.5* 30.6*  ?MCV 96.6 98.8 98.1  ?PLT 161 175 162  ? ?Basic Metabolic Panel: ?Recent Labs  ?Lab 02-Apr-2022 ?1141 03/09/22 ?2120 03/10/22 ?0508  ?NA 136 133* 133*  ?K 4.0 4.1 4.0  ?CL 100 98 97*  ?CO2 '28 25 28  '$ ?GLUCOSE 137* 141* 138*  ?BUN '12 17 15  '$ ?CREATININE 0.77 0.88 0.79  ?CALCIUM 9.4 8.7* 8.6*  ? ?GFR: ?Estimated Creatinine Clearance: 79 mL/min (by C-G formula based on SCr of 0.79 mg/dL). ?Liver Function Tests: ?Recent Labs  ?Lab 03/09/22 ?2120  ?AST 16  ?ALT 17  ?ALKPHOS 89  ?BILITOT 0.9  ?PROT 7.1  ?ALBUMIN 3.5  ? ?No results for input(s): LIPASE, AMYLASE in the last 168 hours. ?No results for input(s): AMMONIA in the last 168 hours. ?Coagulation Profile: ?No results for input(s): INR, PROTIME in the last 168 hours. ?BNP (last 3 results) ?No results for  input(s): PROBNP in the last 8760 hours. ?HbA1C: ?No results for input(s): HGBA1C in the last 72 hours. ?CBG: ?No results for input(s): GLUCAP in the last 168 hours. ?Lipid Profile: ?No results for input(s): CHOL, H

## 2022-03-10 NOTE — ED Notes (Signed)
Patient transported to CT 

## 2022-03-10 NOTE — Assessment & Plan Note (Signed)
Continue home HCTZ and bisoprolol. ?

## 2022-03-10 NOTE — Hospital Course (Addendum)
66 y.o. 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 ?In the ED vitals with tachycardia tachypnea afebrile, hypoxic up to 88% on room air needing 2 L oxygen Labs showed stable WBC count, stable renal function and normal LFTs 2.8 D-dimer 0.9 COVID-19 negative blood culture ordered chest x-ray right lung base opacity, patient was placed on IV antibiotics supplemental oxygen and admitted.  She had CT of the chest no PE but progressive consolidation within the right lung with moderate effusion which is in part is due to postobstructive changes related to narrowing of the bronchial tree by enlarging hilar adenopathy, a stable left lung nodules and a few new right-sided nodules consistent with progressive disease. ?

## 2022-03-10 NOTE — Progress Notes (Signed)
Pt educated on use of Flutter Valve.  Pt demonstrated with good effort and technique.  ?

## 2022-03-10 NOTE — Assessment & Plan Note (Signed)
Continue oxycodone as needed. ?

## 2022-03-10 NOTE — Assessment & Plan Note (Signed)
Continue atorvastatin

## 2022-03-10 NOTE — Assessment & Plan Note (Signed)
Recent admit for left MCA territory infarcts seen on MRI brain 02/17/2022 without residual deficit.  She was started on DAPT for 21 days. ?-Continue aspirin and Plavix for total 21 days then aspirin alone ?-Continue atorvastatin ?

## 2022-03-10 NOTE — Assessment & Plan Note (Signed)
Patient with stage IV metastatic endometrial cancer recently started on therapy with carboplatin, first infusion 3/21.  Follows with oncology, Dr. Alvy Bimler. ?

## 2022-03-11 ENCOUNTER — Inpatient Hospital Stay (HOSPITAL_COMMUNITY): Payer: Medicare HMO

## 2022-03-11 ENCOUNTER — Ambulatory Visit
Admission: RE | Admit: 2022-03-11 | Discharge: 2022-03-11 | Disposition: A | Discharge status: Home / Self Care | Payer: Medicare HMO | Source: Ambulatory Visit | Attending: Radiation Oncology | Admitting: Radiation Oncology

## 2022-03-11 DIAGNOSIS — J9601 Acute respiratory failure with hypoxia: Secondary | ICD-10-CM | POA: Diagnosis not present

## 2022-03-11 DIAGNOSIS — C541 Malignant neoplasm of endometrium: Secondary | ICD-10-CM | POA: Insufficient documentation

## 2022-03-11 DIAGNOSIS — Z510 Encounter for antineoplastic radiation therapy: Secondary | ICD-10-CM | POA: Insufficient documentation

## 2022-03-11 DIAGNOSIS — C772 Secondary and unspecified malignant neoplasm of intra-abdominal lymph nodes: Secondary | ICD-10-CM | POA: Insufficient documentation

## 2022-03-11 DIAGNOSIS — R7989 Other specified abnormal findings of blood chemistry: Secondary | ICD-10-CM

## 2022-03-11 DIAGNOSIS — C7802 Secondary malignant neoplasm of left lung: Secondary | ICD-10-CM | POA: Insufficient documentation

## 2022-03-11 DIAGNOSIS — R609 Edema, unspecified: Secondary | ICD-10-CM

## 2022-03-11 DIAGNOSIS — C7801 Secondary malignant neoplasm of right lung: Secondary | ICD-10-CM | POA: Insufficient documentation

## 2022-03-11 LAB — CBC
HCT: 27.9 % — ABNORMAL LOW (ref 36.0–46.0)
Hemoglobin: 9.6 g/dL — ABNORMAL LOW (ref 12.0–15.0)
MCH: 33.3 pg (ref 26.0–34.0)
MCHC: 34.4 g/dL (ref 30.0–36.0)
MCV: 96.9 fL (ref 80.0–100.0)
Platelets: 147 10*3/uL — ABNORMAL LOW (ref 150–400)
RBC: 2.88 MIL/uL — ABNORMAL LOW (ref 3.87–5.11)
RDW: 15 % (ref 11.5–15.5)
WBC: 7.2 10*3/uL (ref 4.0–10.5)
nRBC: 0 % (ref 0.0–0.2)

## 2022-03-11 LAB — BASIC METABOLIC PANEL
Anion gap: 8 (ref 5–15)
BUN: 16 mg/dL (ref 8–23)
CO2: 28 mmol/L (ref 22–32)
Calcium: 8.5 mg/dL — ABNORMAL LOW (ref 8.9–10.3)
Chloride: 99 mmol/L (ref 98–111)
Creatinine, Ser: 0.82 mg/dL (ref 0.44–1.00)
GFR, Estimated: >60 mL/min (ref >60)
Glucose, Bld: 130 mg/dL — ABNORMAL HIGH (ref 70–99)
Potassium: 4.1 mmol/L (ref 3.5–5.1)
Sodium: 135 mmol/L (ref 135–145)

## 2022-03-11 LAB — GLUCOSE, CAPILLARY: Glucose-Capillary: 117 mg/dL — ABNORMAL HIGH (ref 70–99)

## 2022-03-11 MED ORDER — LIDOCAINE HCL 1 % IJ SOLN
INTRAMUSCULAR | Status: AC
  Filled 2022-03-11: qty 20

## 2022-03-11 MED ORDER — AZITHROMYCIN 250 MG PO TABS
500.0000 mg | ORAL_TABLET | Freq: Every day | ORAL | Status: DC
  Administered 2022-03-11: 500 mg via ORAL
  Filled 2022-03-11: qty 2

## 2022-03-11 NOTE — Progress Notes (Addendum)
HEMATOLOGY-ONCOLOGY PROGRESS NOTE ? ?I saw the patient, examined her and agree with documentation as follows ?I have reviewed plan of care with the patient and multiple family members ?ASSESSMENT AND PLAN: ?Endometrial cancer ([place]) ?Overall, she is weak and complained of excessive fatigue despite near normal hemoglobin in our office last week ?Her pulmonary exam was normal and steroids were not recommended ?She received palliative chemo with single agent carboplatin ?CTA chest on admission negative for PE but she does have some progressive consolidation in the right lung with a moderate effusion as well as postobstructive changes related to narrowing of the bronchial tree by enlarging hilar adenopathy ?Palliative chemotherapy was only started 6 days ago and too early to see improvement ?We will perform restaging CT scans after several cycles of palliative chemotherapy ? ?Acute respiratory failure with hypoxia ?Currently on empiric antibiotics for postobstructive pneumonia ?Has a moderate effusion on the right and not sure if thoracentesis would improve symptoms ?I recommend consultation with interventional radiologist to see if thoracentesis is feasible ?I also recommend consultation with radiation oncologist for potential palliative radiation therapy to the mediastinum ? ?Anemia ?Due to recent chemotherapy ?Likely in part playing into her dyspnea ?Not low enough to transfuse ?Monitor ?  ?Cancer associated pain ?She has multifactorial pain ?I recommend low-dose oxycodone to take as needed ?We discussed risk of constipation ?  ?Essential hypertension ?She has history of uncontrolled hypertension ?Due to poor oral fluid intake, I think she would be at risk of dehydration ?I recommend holding off taking hydrochlorothiazide if she is not able to drink enough fluid ?  ?Bilateral lower extremity edema ?She has multifactorial bilateral lower extremity edema ?Observe only for now ? ?Discharge planning ?She has ongoing  dyspnea/shortness of breath ?May need oxygen upon discharge ?Not yet ready for discharge -anticipate that she will remain in the hospital for 1 to 2 days ? ?Mikey Bussing, DNP, AGPCNP-BC, AOCNP ?[name],  MD ? ?SUBJECTIVE: ?The patient is under our care for metastatic endometrial cancer ?Was found to have disease progression earlier this month on scan ?Was restarted on systemic chemotherapy with single agent carboplatin ?First cycle was given on 03/05/2022 ?Now admitted with acute respiratory failure with hypoxia ?This morning, on O2 ?Had a breathing treatment with minimal improvement ?Reports shortness of breath is somewhat worse with exertion ?She is not having any chest pain ?No abdominal pain, nausea, vomiting ?Bowels move this morning ?No bleeding reported ? ?Oncology History Overview Note  ?ER/PR neg, MSI stable ?Progressed on [place] ?  ?Endometrial cancer ([place])  ?06/15/2018 Surgery  ? Surgeon: Donaciano Eva  ?   ?Operation: Robotic-assisted laparoscopic total hysterectomy with bilateral salpingoophorectomy, SLN biopsy  ?   ?Operative Findings:  : 6cm uterus, normal tubes and ovaries, omentum adherent to the anterior abdominal wall and uterine fundus. No suspicious nodes. ?  ?06/15/2018 Pathology Results  ? Procedure: Total hysterectomy with bilateral salpingo-oophorectomy. Bilateral obturator sentinel lymph node ?biopsies. ?Histologic type: Endometrioid adenocarcinoma. ?Histologic Grade: FIGO grade 1. ?Myometrial invasion: ?Depth of invasion: 15 mm ?Myometrial thickness: 20 mm ?Uterine Serosa Involvement: Not identified. ?Cervical stromal involvement: Not identified. ?Extent of involvement of other organs: Uninvolved. ?Lymphovascular invasion: Present. ?Regional Lymph Nodes: ?Examined: 2 Sentinel ?0 Non-sentinel ?2 Total ?Lymph nodes with metastasis: 0 ?Isolated tumor cells (< 0.2 mm): 2 ?Micrometastasis: (> 0.2 mm and < 2.0 mm): 0 ?Macrometastasis: (> 2.0 mm): 0 ?Extracapsular extension:  N/A. ?Tumor block for ancillary studies: 4Q-S. ?MMR / MSI testing: Will be ordered. ?Pathologic Stage  Classification (pTNM, AJCC 8th edition): pT1b, pN0(i+) ?  ?07/20/2018 Imaging  ? 1. Surgical changes from recent hysterectomy. No findings suspicious for residual tumor or adenopathy in the pelvis. ?2. No CT findings to suggest omental, peritoneal surface or solid organ metastatic disease in the abdomen. ?3. Changes consistent with known sarcoidosis in the chest with bulky calcified mediastinal and hilar lymph nodes and interstitial lung disease. I do not see any definite CT findings to suggest metastatic disease involving the chest. ?  ?08/19/2018 - 09/17/2018 Radiation Therapy  ? 08/19/18 - 09/17/18: Vaginal Cuff, 3 cm cylinder with treatment length of 3 cm / 30 Gy delivered in 5 fractions of 6 Gy, Brachytherapy / HDR, Iridium-192 ?  ?05/2005: Left Chest Wall keloid ?  ?04/2004 - 05/2004: Anus, pelvis /inguinal ( Dr. Tammi Klippel) ?  ?01/19/2020 Imaging  ? 1. No definite CT findings of the abdomen or pelvis to explain pain. ?2. The sigmoid colon and rectum are decompressed although slightly thickened appearing, suggestive of nonspecific infectious, inflammatory, or ischemic colitis. Correlate for referable clinical symptoms. ?3. There is trace, nonspecific free fluid in the low pelvis, which may be reactive, although would be difficult to distinguish from postoperative and post treatment soft tissue change in the pelvis given history of hysterectomy and rectal cancer. ?4. Unchanged prominent retroperitoneal and iliac lymph nodes. No new findings suspicious for metastatic disease in the abdomen or pelvis. ?5. Bibasilar pulmonary fibrosis and mediastinal and hilar lymphadenopathy, better evaluated by prior CT chest and consistent with patient diagnosis of sarcoidosis. ?  ?  ?  ?04/18/2020 Imaging  ? Venous Doppler ?Left:  ?- No evidence of deep vein thrombosis seen in the left lower extremity, from the common femoral through the  popliteal veins.  ?- No evidence of superficial venous thrombosis in the left lower extremity.  ?- There is no evidence of venous reflux seen in the left lower extremity.  ?- Rouleax flow noted in the superficial system.  ?  ?05/18/2020 PET scan  ? 1. Complex scan with a combination of hypermetabolic granulomatous disease (sarcoidosis) as well as hypermetabolic metastatic endometrial carcinoma. ?2. Hypermetabolic mediastinal hilar lymph nodes and pulmonary nodularity with bronchovascular thickening is consistent with sarcoidosis. ?3. Solitary hypermetabolic nodule in the medial LEFT lower lobe, new from prior with intense metabolic activity, is concerning for a metastatic deposit to the LEFT lower lobe. ?4. Ill-defined hypermetabolic soft tissue along the LEFT operator space is most consistent with local recurrence of endometrial carcinoma. There is entrapment of the LEFT ureter by this hypermetabolic soft tissue with mild hydroureter and hydronephrosis on the LEFT. ?5. Additionally there appears to be direct extension from the hypermetabolic left operator space soft tissue to the sigmoid colon with there is a well-defined hypermetabolic lesion in the colon. ?6. More distant hypermetabolic peritoneal metastasis along the ventral peritoneal surface of the RIGHT abdomen beneath RIGHT rectus abdominus muscle. ?7. Hypermetabolic lymph nodes in the porta hepatis are favored related to sarcoidosis. ?  ?05/19/2020 Cancer Staging  ? Staging form: Corpus Uteri - Carcinoma and Carcinosarcoma, AJCC 8th Edition ?- Clinical stage from 05/19/2020: Stage IVB (rcT1, cN2a, cM1) - Signed by [name], MD on 05/19/2020 ? ?  ?05/26/2020 Procedure  ? Successful placement of a right internal jugular approach power injectable Port-A-Cath. The catheter is ready for immediate use ?  ?05/29/2020 - 09/27/2020 Chemotherapy  ? The patient had carboplatin and taxol for chemotherapy treatment.  ? ?  ?07/31/2020 Imaging  ? 1. Asymmetric soft tissue mass  extending from the left vaginal cuff has decreased in size in the interval. No residual peritoneal nodularity. Pulmonary metastases are stable with exception of a minimally improved left lower lobe nodule. ?2. Sta

## 2022-03-11 NOTE — Procedures (Signed)
PROCEDURE SUMMARY: ? ?Successful image-guided right thoracentesis. ?Obtained 50 mL of dark, bloody fluid. ?Pt tolerated procedure well. ?No immediate complications. ?EBL = trace  ? ?Specimen was sent for labs. ?CXR ordered. ? ?Please see imaging section of Epic for full dictation. ? ?Tera Mater PA-C ?03/11/2022 ?3:10 PM ? ? ? ?

## 2022-03-11 NOTE — Progress Notes (Signed)
Lower extremity venous bilateral study completed.   Please see CV Proc for preliminary results.   Dorothea Yow, RDMS, RVT  

## 2022-03-11 NOTE — Progress Notes (Incomplete)
?Radiation Oncology         [phone] ?________________________________ ? ?Inpatient Re-Consultation ? ?Name: [name] MRN: [id number]  ?Date: 03/11/2022  DOB: 09/09/1956 ? ?NW:GNFAOZ, Veronica So, MD  [name], [name], MD  ? ?REFERRING PHYSICIAN: [name], [name], MD ? ?DIAGNOSIS: There were no encounter diagnoses. ? ?Stage IB grade 1 carcinoma of endometrium with high/intermediate risk factors (deep myo invasion, LVSI) ? ?Metastatic endometrial cancer diagnosed in June 2021; colon, left lung, intrapelvic lymph nodes.  ? ?New evidence of right lung mets: 03/05/22 admission  ? ?Interval Since Last Radiation:  3 years, 5 months, and 24 days  ? ?08/19/18 - 09/17/18: Vaginal Cuff, 3 cm cylinder with treatment length of 3 cm / 30 Gy delivered in 5 fractions of 6 Gy, Brachytherapy / HDR, Iridium-192 ?  ?05/2005: Left Chest Wall keloid ?  ?04/2004 - 05/2004: Anus, pelvis /inguinal ( Dr. Tammi Klippel) ? ?HISTORY OF PRESENT ILLNESS::[name] is a 66 y.o. female who is currently admitted and is accompanied by ***. she is seen as a courtesy of Dr. Alvy Bimler for re-evaluation and an opinion concerning radiation therapy as part of management for recent evidence of pulmonary disease progression (right lung). I last met with the patient for follow-up on 02/10/20. Since she was last seen, the patient unfortunately developed recurrence of disease and metastatic disease in June of 2021. PET on 05/18/20 showed various metastases (intrapelvic lymph nodes, left lung, colon). The patient accordingly pursued further chemotherapy under Dr. Jacklynn Lewis.  Following 2 rounds of chemotherapy on 05/26/20 through 09/27/20 and 02/02/21 through 07/13/21, her disease remained stable until her recent occurrence of disease progression. Her recent subsequent history is as follows.  ? ?The patient presented to the ED on 03/09/22 with progressive SOB and exertional dyspnea x 5 days, and associated nonproductive cough. The patient also endorsed some occasional  swelling to her legs and chest tightness, but denied any chest pain. Upon ED evaluation; SPO2 down to 88% on room air, and she is currently stable on 2 L via . Labs showed elevated D-dimer. (Encounter notes also reiterated her known pulmonary metastases and ongoing chemotherapy). CXR performed for evaluation in the ED showed a right lung base opacity, noted as possibly representative of a combination of PE and associated atelectasis or infiltrate. Bilateral hilar fullness was also appreciated. She was accordingly given IV ceftriaxone and azithromycin, and admitted for further evaluation and management.  ? ?CTA of the chest on 03/10/22 re-demonstrated the progressive consolidation within the right lung as described, with associated moderate effusion (noted as partially related to postobstructive changes related to narrowing of the bronchial tree by enlarging hilar adenopathy). CT also showed: stable left lung nodules; a few new right-sided nodules; and an enlarging nodule in the medial aspect of the right upper lobe. Overall, these changes are consistent with disease progression.  ? ?Today, the patient underwent thoracentesis which removed approximately 50 cc's of dark red/bloody pleural fluid. (Post-op CXR negative for pneumothorax). Pleural fluid has been sent for labs. Results are pending at this time. ? ?The patient had been on [place] for treatment of her endometrial cancer. Treatment has recently been changed to carboplatin on 03/05/22 (Dr. Alvy Bimler) just prior to onset of her symptoms. Per her most recent follow-up visit with Dr. Alvy Bimler on 03/05/22, the patient was noted to be not feeling well in general. She endorsed occasional cough, excessive fatigue (despite near normal hemoglobin) SOB on minimal exertion, and reported recently injuring her elbow which is causing her significant pain (Dr.  Gorsuch prescribed oxycodone for this). ? 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  She was started on DAPT for 21 days. She was also instructed to take aspirin and Plavix for a total of 21 days, and to continue on atorvastatin.  ? ? ?PAST MEDICAL HISTORY:  ?Past Medical History:  ?Diagnosis Date  ? Bifid uvula   ? Blood transfusion without reported diagnosis   ? Endometrial ca [facility]) dx'd 06/2018  ? chemo  ? GERD (gastroesophageal reflux disease)   ? History of cervical dysplasia   ? History of chemotherapy   ? 06/ 2005  ? History of cleft lip   ? CORRECTED  ? History of condyloma acuminatum   ? PERIANAL  ? History of radiation therapy   ? completed 06/ 2005  ? History of vulvar dysplasia   ? Hypertension   ? 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 Pulmonary nodule, left   ? 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 PULMOLOGIST-  DR Melvyn Novas--  CHRONIC PREDNISONE  ? Sarcoid uveitis of left eye goes to [facility]  ? intermittantly--  currently no issues per pt 11-27-2016  ? 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 S/P  local resection 03/2004.  ChemoXRT 2005 Dr Collier Salina Rubin/[facility]  ? Upper airway cough syndrome   ? Wears glasses   ? ? ?PAST SURGICAL HISTORY: ?Past Surgical History:  ?Procedure Laterality Date  ? BRONCHOSCOPY  01/22/2008  ? w/ Lavage and bronchial bx  ? CARDIOVASCULAR STRESS TEST  06/27/2006  ? normal nuclear study w/ no ischemia/  normal LV function and wall motion , ef 61%  ? CARPAL TUNNEL RELEASE Right 01/17/2016  ? and finger trigger release  ? CLEFT LIP REPAIR    ? CO2 LASER ABLATION VULVA    ? EUA/  EXCISIONAL MULTIPLE PERINEAL/ANAL BX'S/  SIGMOIDSCOPY/  PORT-A-CATH PLACEMENT  03/27/2004  ? Local excision - Dr Lennie Hummer  ? IR IMAGING GUIDED PORT INSERTION  05/26/2020  ? LAPAROSCOPIC CHOLECYSTECTOMY  08/14/2008  ? MULTIPLE ANAL BX'S  10/10/2006  ? RELEASE A1 PULLEY THUMB Bilateral left 12-17-2006/  right 09-05-2009  ? REMOVAL PORT-A-CATH/  EUA/  MULTIPLE ANAL BX'S  10/16/2004  ? ROBOTIC ASSISTED TOTAL  HYSTERECTOMY WITH BILATERAL SALPINGO OOPHERECTOMY Bilateral 06/25/2018  ? Procedure: XI ROBOTIC ASSISTED TOTAL HYSTERECTOMY WITH BILATERAL SALPINGO OOPHORECTOMY;  Surgeon: Everitt Amber, MD;  Location: [facility];  Service: Gynecology;  Laterality: Bilateral;  ? SENTINEL NODE BIOPSY N/A 06/25/2018  ? Procedure: SENTINEL NODE BIOPSY;  Surgeon: Everitt Amber, MD;  Location: [facility];  Service: Gynecology;  Laterality: N/A;  ? TENNIS ELBOW RELEASE/NIRSCHEL PROCEDURE Right 11/28/2016  ? Procedure: RIGHT ELBOW ULNER NERVE RELEASE AND OR TRANSPOSITION;  Surgeon: Iran Planas, MD;  Location: [facility];  Service: Orthopedics;  Laterality: Right;  ? ? ?FAMILY HISTORY:  ?Family History  ?Problem Relation Age of Onset  ? Allergies Mother   ? Asthma Mother   ? Lung cancer Mother   ? Allergies Sister   ? Allergies Brother   ? Asthma Brother   ? Asthma Sister   ? Sarcoidosis Neg Hx   ? Colon cancer Neg Hx   ? Liver cancer Neg Hx   ? Stomach cancer Neg Hx   ? Esophageal cancer Neg Hx   ? Rectal cancer Neg Hx   ? ? ?SOCIAL HISTORY:  ?Social  History  ? ?Tobacco Use  ? Smoking status: Former  ?  Packs/day: 0.50  ?  Years: 1.00  ?  Pack years: 0.50  ?  Types: Cigarettes  ?  Quit date: 12/16/1993  ?  Years since quitting: 28.2  ? Smokeless tobacco: Never  ?Vaping Use  ? Vaping Use: Never used  ?Substance Use Topics  ? Alcohol use: Yes  ?  Comment: occasional  ? Drug use: No  ? ? ?ALLERGIES:  ?Allergies  ?Allergen Reactions  ? Shellfish Allergy Hives, Itching and Swelling  ? Penicillins Hives and Itching  ?  Has patient had a PCN reaction causing immediate rash, facial/tongue/throat swelling, SOB or lightheadedness with hypotension: Yes ?Has patient had a PCN reaction causing severe rash involving mucus membranes or skin necrosis: Yes ?Has patient had a PCN reaction that required hospitalization: Yes ?Has patient had a PCN reaction occurring within the last 10 years: No ?If all of the above answers are "NO", then may proceed with  Cephalosporin use. ?  ? ? ?MEDICATIONS:  ?No current facility-administered medications for this encounter.  ? ?No current outpatient medications on file.  ? ?Facility-Administered Medications Ordered in Other

## 2022-03-11 NOTE — Progress Notes (Signed)
?PROGRESS NOTE ?[name]  IWP:[id number] DOB: 07-06-1956 DOA: 03/09/2022 ?PCP: [name], [name], MD  ? ?Brief Narrative/Hospital Course: ?66 y.o. 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 ?In the ED vitals with tachycardia tachypnea afebrile, hypoxic up to 88% on room air needing 2 L oxygen Labs showed stable WBC count, stable renal function and normal LFTs 2.8 D-dimer 0.9 COVID-19 negative blood culture ordered chest x-ray right lung base opacity, patient was placed on IV antibiotics supplemental oxygen and admitted.  She had CT of the chest no PE but progressive consolidation within the right lung with moderate effusion which is in part is due to postobstructive changes related to narrowing of the bronchial tree by enlarging hilar adenopathy, a stable left lung nodules and a few new right-sided nodules consistent with progressive disease.  ?  ?Subjective: ?Seen and examined this morning overall feeling some better, still needing some oxygen and has chest pain and dyspnea ?Overnight afebrile.  Her daughter is at the bedside today. ? ?Assessment and Plan: ?Principal Problem: ?  Acute respiratory failure with hypoxia ([place]) ?Active Problems: ?  Sarcoidosis ([place]) ?  Endometrial cancer ([place]) ?  History of CVA (cerebrovascular accident) ?  Essential hypertension ?  Hyperlipidemia, mixed ?  Cancer associated pain ?  Anemia due to antineoplastic chemotherapy ?  Acquired hypothyroidism ?  ?Acute respiratory failure with hypoxia ?Progressive metastatic disease on the CTA ?Postobstructive changes with progressive consolidation right lung ?Right-sided pleural effusion: ?We will  continue on current empiric antibiotics likely has postobstructive pneumonia, CT also shows progressive disease which is likely contributing most of her symptoms-could be too early to see palliative chemo response. Continue bronchodilators, supplemental oxygen, follow-up culture data urinary antigens.  Awaiting for oncology inputs.  ?Recent Labs  ?Lab 03/05/22 ?1141 03/09/22 ?2120 03/10/22 ?5053 03/11/22 ?0359  ?WBC 10.2 9.4 8.7 7.2  ?  ?Stage IV endometrial cancer with metastasis to lung right rectus muscle-now with progressive disease based on CT scan above.  Will have her oncologist see today.  Consulted.   ? ?Recent left MCA territory infarct on 02/17/2022 without residual deficit on aspirin Plavix planned for 21 days-which patient completed 3/26, now only continue aspirin, Lipitor  ? ?Sarcoidosis hx Patient previously on chronic prednisone therapy which was titrated off last year prior to initiation of immunotherapy.  Lower suspicion for sarcoid flare at this time but low threshold to add steroids back pending clinical progress.  Awaiting for oncology input. ? ?Essential hypertension: BP is stable, cont  HCTZ and bisoprolol ?Mildly Elevated D-dimer- neg CTA and Duplex leg for VTE. ?Hypothyroidism cont Synthroid ? ?Anemia due to chemotherapy-stable.  Monitor hemoglobin ?Recent Labs  ?Lab 03/05/22 ?1141 03/09/22 ?2120 03/10/22 ?9767 03/11/22 ?0359  ?HGB 11.6* 11.1* 10.7* 9.6*  ?HCT 33.7* 32.5* 30.6* 27.9*  ?  ?Cancer related pain cont oral opiates and on opiates. ? ?HLD: Continue Lipitor.  ? ?Class I Obesity:Patient's Body mass index is 31.92 kg/m?. : Will benefit with PCP follow-up, weight loss  healthy lifestyle and outpatient sleep evaluation. ? ? ?DVT prophylaxis: enoxaparin (LOVENOX) injection 40 mg Start: 03/10/22 1000 ?Code Status:   Code Status: Full Code ?Family Communication: plan of care discussed with patient/daughter and multiple family members at bedside. ?Patient status is: Inpatient due  to ongoing  respiratory issue,Telemetry  ?Patient currently not stable ? ?Dispo: The patient is from: home ?           Anticipated disposition: home ? ?Mobility Assessment (last 72 hours)   ? ? Mobility Assessment   ? ? [place] Name 03/10/22 2130 03/10/22 0830 03/10/22 0139  ?  ?  ? Does patient have an order for bedrest or is patient medically unstable No - Continue assessment No - Continue assessment No - Continue assessment    ? What is the highest level of mobility based on the progressive mobility assessment? Level 5 (Walks with assist in room/hall) - Balance while stepping forward/back and can walk in room with assist - Complete Level 5 (Walks with assist in room/hall) - Balance while stepping forward/back and can walk in room with assist - Complete Level 5 (Walks with assist in room/hall) - Balance while stepping forward/back and can walk in room with assist - Complete    ? ?  ?  ? ?  ?  ? ?Objective: ?Vitals last 24 hrs: ?Vitals:  ? 03/10/22 2030 03/11/22 0401 03/11/22 0500 03/11/22 0841  ?BP: (!) 117/95 126/73    ?Pulse: 73 66    ?Resp: 20 16    ?Temp: 98.2 ?F (36.8 ?C) 98 ?F (36.7 ?C)    ?TempSrc: Oral Oral    ?SpO2: 97% 98%  96%  ?Weight:   87 kg   ?Height:      ? ?Weight change: -1.3 kg ? ?Physical Examination: ?General exam: AA0x3,older than stated age, weak appearing. ?HEENT:Oral mucosa moist, Ear/Nose WNL grossly, dentition normal. ?Respiratory system: bilaterally clear, mildly  diminished on the right base,no use of accessory muscle ?Cardiovascular system: S1 & S2 +, No JVD,. ?Gastrointestinal system: Abdomen soft,NT,ND, BS+ ?Nervous System:Alert, awake, moving extremities and grossly nonfocal ?Extremities: edema neg,distal peripheral pulses palpable.  ?Skin: No rashes,no icterus. ?MSK: Normal muscle bulk,tone, power ? ? ?Medications reviewed:  ?Scheduled Meds: ? aspirin EC  81 mg Oral Daily  ? atorvastatin  80 mg Oral Daily  ? azithromycin  500 mg Oral QHS  ? bisoprolol  10 mg Oral BID  ? Chlorhexidine Gluconate  Cloth  6 each Topical Daily  ? diphenhydrAMINE  25 mg Oral QHS  ? enoxaparin (LOVENOX) injection  40 mg Subcutaneous Q24H  ? famotidine  20 mg Oral Daily  ? guaiFENesin  600 mg Oral BID  ? hydrochlorothiazide  25 mg Oral Daily  ? levothyroxine  100 mcg Oral Q0600  ? mometasone-formoterol  2 puff Inhalation BID  ? sodium chloride flush  10-40 mL Intracatheter Q12H  ? sodium chloride flush  3 mL Intravenous Q12H  ? ?Continuous Infusions: ? cefTRIAXone (ROCEPHIN)  IV 2 g (03/10/22 2232)  ? ? ?  ?Diet Order   ? ?       ?  Diet regular Room service appropriate? Yes; Fluid consistency: Thin  Diet effective now       ?  ? ?  ?  ? ?  ?  ? ?  ?  ?  ? ? ?Intake/Output Summary (Last 24 hours) at 03/11/2022 1341 ?Last data filed at 03/11/2022 0620 ?Gross per 24 hour  ?Intake 1180 ml  ?Output 600 ml  ?Net 580 ml  ? ?Net IO Since Admission: 1,629 mL [03/11/22 1341]  ?Wt Readings from Last 3 Encounters:  ?03/11/22 87 kg  ?03/05/22 88.4 kg  ?02/27/22 88.5 kg  ?  ? ?Unresulted Labs (From admission, onward)  ? ?  Start     Ordered  ? 03/11/22 3435  Basic metabolic panel  Daily,   R     ?Question:  Specimen collection method  Answer:  Lab=Lab collect  ? 03/10/22 1151  ? 03/11/22 0500  CBC  Daily,   R     ?Question:  Specimen collection method  Answer:  Lab=Lab collect  ? 03/10/22 1151  ? 03/10/22 0008  Strep pneumoniae urinary antigen  (COPD / Pneumonia / Cellulitis / Lower Extremity Wound)  Once,   R       ? 03/10/22 0009  ? 03/10/22 0008  Legionella Pneumophila Serogp 1 Ur Ag  (COPD / Pneumonia / Cellulitis / Lower Extremity Wound)  Once,   R       ? 03/10/22 0009  ? ?  ?  ? ?  ?Data Reviewed: I have personally reviewed following labs and imaging studies ?CBC: ?Recent Labs  ?Lab March 23, 2022 ?1141 03/09/22 ?2120 03/10/22 ?6861 03/11/22 ?0359  ?WBC 10.2 9.4 8.7 7.2  ?NEUTROABS 7.8* 7.2  --   --   ?HGB 11.6* 11.1* 10.7* 9.6*  ?HCT 33.7* 32.5* 30.6* 27.9*  ?MCV 96.6 98.8 98.1 96.9  ?PLT 161 175 162 147*  ? ?Basic Metabolic Panel: ?Recent  Labs  ?Lab March 23, 2022 ?1141 03/09/22 ?2120 03/10/22 ?6837 03/11/22 ?0359  ?NA 136 133* 133* 135  ?K 4.0 4.1 4.0 4.1  ?CL 100 98 97* 99  ?CO2 '28 25 28 28  '$ ?GLUCOSE 137* 141* 138* 130*  ?BUN '12 17 15 16  '$ ?CREATININE

## 2022-03-12 ENCOUNTER — Ambulatory Visit
Admit: 2022-03-12 | Discharge: 2022-03-12 | Disposition: A | Discharge status: Home / Self Care | Payer: Medicare HMO | Source: Ambulatory Visit | Attending: Radiation Oncology | Admitting: Radiation Oncology

## 2022-03-12 ENCOUNTER — Inpatient Hospital Stay: Payer: Medicare HMO | Admitting: Hematology and Oncology

## 2022-03-12 ENCOUNTER — Ambulatory Visit
Admit: 2022-03-12 | Discharge: 2022-03-12 | Disposition: A | Discharge status: Home / Self Care | Payer: Medicare HMO | Attending: Radiation Oncology | Admitting: Radiation Oncology

## 2022-03-12 ENCOUNTER — Inpatient Hospital Stay: Payer: Medicare HMO

## 2022-03-12 DIAGNOSIS — J9601 Acute respiratory failure with hypoxia: Secondary | ICD-10-CM | POA: Diagnosis not present

## 2022-03-12 DIAGNOSIS — C7802 Secondary malignant neoplasm of left lung: Secondary | ICD-10-CM

## 2022-03-12 DIAGNOSIS — C7801 Secondary malignant neoplasm of right lung: Secondary | ICD-10-CM | POA: Diagnosis not present

## 2022-03-12 DIAGNOSIS — C541 Malignant neoplasm of endometrium: Secondary | ICD-10-CM | POA: Diagnosis not present

## 2022-03-12 LAB — CBC
HCT: 27.8 % — ABNORMAL LOW (ref 36.0–46.0)
Hemoglobin: 9.6 g/dL — ABNORMAL LOW (ref 12.0–15.0)
MCH: 33.7 pg (ref 26.0–34.0)
MCHC: 34.5 g/dL (ref 30.0–36.0)
MCV: 97.5 fL (ref 80.0–100.0)
Platelets: 142 10*3/uL — ABNORMAL LOW (ref 150–400)
RBC: 2.85 MIL/uL — ABNORMAL LOW (ref 3.87–5.11)
RDW: 14.7 % (ref 11.5–15.5)
WBC: 6.4 10*3/uL (ref 4.0–10.5)
nRBC: 0 % (ref 0.0–0.2)

## 2022-03-12 LAB — BASIC METABOLIC PANEL
Anion gap: 8 (ref 5–15)
BUN: 16 mg/dL (ref 8–23)
CO2: 28 mmol/L (ref 22–32)
Calcium: 8.6 mg/dL — ABNORMAL LOW (ref 8.9–10.3)
Chloride: 99 mmol/L (ref 98–111)
Creatinine, Ser: 0.82 mg/dL (ref 0.44–1.00)
GFR, Estimated: >60 mL/min (ref >60)
Glucose, Bld: 126 mg/dL — ABNORMAL HIGH (ref 70–99)
Potassium: 3.9 mmol/L (ref 3.5–5.1)
Sodium: 135 mmol/L (ref 135–145)

## 2022-03-12 MED ORDER — HEPARIN SOD (PORK) LOCK FLUSH 100 UNIT/ML IV SOLN
500.0000 [IU] | INTRAVENOUS | Status: AC | PRN
  Administered 2022-03-12: 500 [IU]
  Filled 2022-03-12: qty 5

## 2022-03-12 MED ORDER — PANTOPRAZOLE SODIUM 40 MG PO TBEC: 40.0000 mg | DELAYED_RELEASE_TABLET | Freq: Every day | ORAL | 0 refills | Status: DC

## 2022-03-12 MED ORDER — CEFADROXIL 500 MG PO CAPS
500.0000 mg | ORAL_CAPSULE | Freq: Two times a day (BID) | ORAL | 0 refills | Status: AC
Start: 2022-03-12 — End: 2022-03-17

## 2022-03-12 MED ORDER — PREDNISONE 20 MG PO TABS: 40.0000 mg | ORAL_TABLET | Freq: Every day | ORAL | 0 refills | Status: DC

## 2022-03-12 MED ORDER — PREDNISONE 20 MG PO TABS
40.0000 mg | ORAL_TABLET | Freq: Every day | ORAL | Status: DC
  Administered 2022-03-12: 40 mg via ORAL
  Filled 2022-03-12: qty 2

## 2022-03-12 MED ORDER — AZITHROMYCIN 500 MG PO TABS
500.0000 mg | ORAL_TABLET | Freq: Every day | ORAL | 0 refills | Status: AC
Start: 2022-03-12 — End: 2022-03-15

## 2022-03-12 NOTE — Progress Notes (Signed)
SATURATION QUALIFICATIONS: (This note is used to comply with regulatory documentation for home oxygen) ? ?Patient Saturations on Room Air at Rest = 94% ? ?Patient Saturations on Room Air while Ambulating = 91% ? ?Please briefly explain why patient needs home oxygen: ?

## 2022-03-12 NOTE — Progress Notes (Signed)
[name]   DOB:11-25-1956   LZ#:[id number]   ? ?ASSESSMENT & PLAN:  ? ?Endometrial cancer ([place]) ?Overall, she is weak and complained of excessive fatigue despite near normal hemoglobin in our office last week ?She received palliative chemo with single agent carboplatin ?CTA chest on admission negative for PE but she does have some progressive consolidation in the right lung with a moderate effusion as well as postobstructive changes related to narrowing of the bronchial tree by enlarging hilar adenopathy ?Palliative chemotherapy was only started 6 days ago and too early to see improvement ?We will perform restaging CT scans after several cycles of palliative chemotherapy ?  ?Acute respiratory failure with hypoxia ?Currently on empiric antibiotics for postobstructive pneumonia ?She felt a bit better despite minimum fluid drawn from thoracentesis ?Examination revealed reduce breath sounds on the right lung base ?Radiation oncology consultation is pending ?I recommend another trial of prednisone therapy; previously, she had marked improvement on prednisone ?She had a component of sarcoidosis that is contributing to the mediastinal lymphadenopathy that would probably respond to prednisone treatment ?I recommend testing to see if she would benefit from home oxygen prior to discharge ?I discussed the importance of incentive spirometry on an hourly basis while awake ? ?Anemia ?Due to recent chemotherapy ?Likely in part playing into her dyspnea ?Not low enough to transfuse ?Monitor ?  ?Cancer associated pain ?She has multifactorial pain ?I recommend low-dose oxycodone to take as needed ?We discussed risk of constipation ?  ?Essential hypertension ?She has history of uncontrolled hypertension ?Due to poor oral fluid intake, I think she would be at risk of dehydration ?I recommend holding off taking hydrochlorothiazide if she is not able to drink enough fluid ?  ?Bilateral lower extremity edema ?She has multifactorial  bilateral lower extremity edema ?Observe only for now ?  ?Discharge planning ?She has ongoing dyspnea/shortness of breath ?May need oxygen upon discharge ?Once she complete radiation consultation, she will be ready for discharge planning, anticipate discharge by tomorrow ?She has appointment to see me on April 4 next week for follow-up ? ?The total time spent in the appointment was 40 minutes encounter with patients including review of chart and various tests results, discussions about plan of care and coordination of care plan ? ?[name], MD ?03/12/2022 8:06 AM ? ?Subjective:  ?She felt better ?She has mild nonproductive cough ?No fever or chills ? ?Objective:  ?Vitals:  ? 03/11/22 2203 03/12/22 0634  ?BP: 136/75 120/65  ?Pulse: 71 65  ?Resp: 16 18  ?Temp: 97.7 ?F (36.5 ?C) 97.9 ?F (36.6 ?C)  ?SpO2: 98% 97%  ?  ? ?Intake/Output Summary (Last 24 hours) at 03/12/2022 0806 ?Last data filed at 03/12/2022 [phone] ?Gross per 24 hour  ?Intake 1280 ml  ?Output 1850 ml  ?Net -570 ml  ? ? ?GENERAL:alert, no distress and comfortable ?SKIN: skin color, texture, turgor are normal, no rashes or significant lesions ?EYES: normal, Conjunctiva are pink and non-injected, sclera clear ?OROPHARYNX:no exudate, no erythema and lips, buccal mucosa, and tongue normal  ?NECK: supple, thyroid normal size, non-tender, without nodularity ?LYMPH:  no palpable lymphadenopathy in the cervical, axillary or inguinal ?LUNGS: She has reduced breath sounds on the right lung base ?HEART: regular rate & rhythm and no murmurs with persistent lower extremity edema ?ABDOMEN:abdomen soft, non-tender and normal bowel sounds ?Musculoskeletal:no cyanosis of digits and no clubbing  ?NEURO: alert & oriented x 3 with fluent speech, no focal motor/sensory deficits ?  ?Labs:  ?Recent Labs  ?  02/17/22 ?  1445 02/18/22 ?5035 03/05/22 ?1141 03/09/22 ?2120 03/10/22 ?4656 03/11/22 ?8127 03/12/22 ?0357  ?NA 137 138   < > 133* 133* 135 135  ?K 3.5 3.7   < > 4.1 4.0 4.1 3.9   ?CL 100 102   < > 98 97* 99 99  ?CO2 27 25   < > '25 28 28 28  '$ ?GLUCOSE 141* 153*   < > 141* 138* 130* 126*  ?BUN 18 19   < > '17 15 16 16  '$ ?CREATININE 0.89 1.06*   < > 0.88 0.79 0.82 0.82  ?CALCIUM 8.9 8.9   < > 8.7* 8.6* 8.5* 8.6*  ?GFRNONAA >60 59*   < > >60 >60 >60 >60  ?PROT 8.0 6.9  --  7.1  --   --   --   ?ALBUMIN 3.9 3.3*  --  3.5  --   --   --   ?AST 23 20  --  16  --   --   --   ?ALT 24 22  --  17  --   --   --   ?ALKPHOS 107 90  --  89  --   --   --   ?BILITOT 0.5 0.4  --  0.9  --   --   --   ? < > = values in this interval not displayed.  ? ? ?Studies:  ?CT ANGIO HEAD NECK W WO CM ? ?Result Date: 02/18/2022 ?CLINICAL DATA:  Stroke, follow up EXAM: CT ANGIOGRAPHY HEAD AND NECK TECHNIQUE: Multidetector CT imaging of the head and neck was performed using the standard protocol during bolus administration of intravenous contrast. Multiplanar CT image reconstructions and MIPs were obtained to evaluate the vascular anatomy. Carotid stenosis measurements (when applicable) are obtained utilizing NASCET criteria, using the distal internal carotid diameter as the denominator. RADIATION DOSE REDUCTION: This exam was performed according to the departmental dose-optimization program which includes automated exposure control, adjustment of the mA and/or kV according to patient size and/or use of iterative reconstruction technique. CONTRAST:  29m OMNIPAQUE IOHEXOL 350 MG/ML SOLN COMPARISON:  CT and MRI February 17, 2022. FINDINGS: CT HEAD FINDINGS Brain: Subcentimeter infarcts seen on recent MRI is not visible by CT. No evidence of acute large vascular territory infarct, acute hemorrhage, mass lesion, midline shift, or hydrocephalus. Vascular: See below. Skull: No acute fracture. Sinuses: Kozel thickening of the visualized maxillary sinuses and scattered ethmoid air cells. Frothy secretions in the left sphenoid sinus. Orbits: No acute finding. Review of the MIP images confirms the above findings CTA NECK FINDINGS Aortic arch:  Great vessel origins are patent with mild atherosclerotic narrowing. Right carotid system: Mixed calcific and noncalcific atherosclerosis at the carotid bifurcation without greater than 50% stenosis. Left carotid system: Mixed calcific and noncalcific atherosclerosis involving the common carotid artery and carotid bifurcation without greater than 50% stenosis. Vertebral arteries: Right dominant. No evidence of significant (greater than 50%) stenosis. Mild narrowing of the vertebral artery origins bilaterally due to atherosclerosis. Skeleton: No evidence of acute abnormality. Other neck: No evidence of acute abnormality. Upper chest: Increased size/bulk of the band like opacity previously seen on 11/21/2021 CT chest, which is partially imaged on this study. There is suggestion that this area may be spiculated. Review of the MIP images confirms the above findings CTA HEAD FINDINGS Anterior circulation: Mild narrowing of bilateral intracranial ICAs due to calcific atherosclerosis. Bilateral MCAs and ACAs are patent without proximal hemodynamically significant stenosis. Approximately 1-2 mm inferiorly directed outpouching arising  from the right supraclinoid ICA (series 12, image 70), which could represent an infundibulum with vessel not well seen or small aneurysm. Posterior circulation: Right dominant intradural vertebral artery. Bilateral intradural vertebral arteries, basilar artery, and posterior cerebral arteries are patent without significant proximal stenosis. Mild multifocal narrowing of the PCAs bilaterally. Venous sinuses: As permitted by contrast timing, patent. Anatomic variants: See above. Review of the MIP images confirms the above findings IMPRESSION: CTA head: 1. No large vessel occlusion. 2. Multiple sites of mild atherosclerotic narrowing without proximal hemodynamically significant stenosis. 3. Approximately 1-2 mm inferiorly directed outpouching arising from the right supraclinoid ICA, which could  represent an infundibulum with vessel not well seen or small aneurysm. CTA neck: 1. No significant (greater than 50%) stenosis. 2. Bilateral carotid bifurcation atherosclerosis. 3. Increased size/bulk of t

## 2022-03-12 NOTE — Discharge Summary (Addendum)
Physician Discharge Summary  ?[name] NFA:[id number] DOB: 01-Oct-1956 DOA: 03/09/2022 ? ?PCP: [name], [name], MD ? ?Admit date: 03/09/2022 ?Discharge date: 03/12/2022 ?Recommendations for Outpatient Follow-up:  ?Follow up with PCP in 1 weeks-call for appointment ?Please obtain BMP/CBC in one week ?Fu with radiation therapy as scheduled ? ?Discharge Dispo: Home ?Discharge Condition: Stable ?Code Status:   Code Status: Full Code ?Diet recommendation:  ?Diet Order   ? ?       ?  Diet regular Room service appropriate? Yes; Fluid consistency: Thin  Diet effective now       ?  ? ?  ?  ? ?  ?  ? ?Brief/Interim Summary: ?66 y.o. 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 ?In the ED vitals with tachycardia tachypnea afebrile, hypoxic up to 88% on room air needing 2 L oxygen Labs showed stable WBC count, stable renal function and normal LFTs 2.8 D-dimer 0.9 COVID-19 negative blood culture ordered chest x-ray right lung base opacity, patient was placed on IV antibiotics supplemental oxygen and admitted.  She had CT of the chest no PE but progressive consolidation within the right lung with moderate effusion which is in part is due to postobstructive changes related to narrowing of the bronchial tree by enlarging hilar adenopathy, a stable left lung nodules and a few new right-sided nodules consistent with progressive disease.  ?Treated with antibiotics for post obstructive pneumonia and also started on another trial of prednisone therapy as previously she had marked improvement on prednisone. Complete po abx. IR had attempted thoracentesis 3/27. Continue bronchodilators, supplemental  oxygen- able to wean off oxygen- going for XRT today. Spoke w/ Dr Sondra Come and okay for dc home after XRT. SPOKE W DAUGHTER TOO ?Spoke w/ Dr Alvy Bimler and okay for home today. ?AmbULATORY pulse ox eval per RN: "Patient Saturations on Room Air at Rest = 94%;Patient Saturations on Room Air while Ambulating = 91% ?Patient was discharged after her radiotherapy, and after discussing with her rad onc MD. ? ?Discharge Diagnoses:  ?Principal Problem: ?  Acute respiratory failure with hypoxia ([place]) ?Active Problems: ?  Sarcoidosis ([place]) ?  Endometrial cancer ([place]) ?  History of CVA (cerebrovascular accident) ?  Essential hypertension ?  Hyperlipidemia, mixed ?  Cancer associated pain ?  Anemia due to antineoplastic chemotherapy ?  Acquired hypothyroidism ? ?Assessment and Plan: ?Acute respiratory failure with hypoxia ?Progressive metastatic disease on the CTA ?Postobstructive changes with progressive consolidation right lung ?Right-sided pleural effusion-only 50 mL was able to be tapped and thoracentesis 3/27: ?Continue antibiotic for postobstructive pneumonia. CT also shows progressive disease which is likely contributing most of her symptoms-could be too early to see palliative chemo response.  Seen by oncology this morning starting another trial of prednisone therapy as previously she had marked improvement on prednisone. Complete po abx. IR had attempted thoracentesis 3/27. Continue bronchodilators, supplemental oxygen- able to wean off oxygen- going for XRT today. Spoke w/ Dr Sondra Come and okay for dc home after XRT. SPOKE W DAUGHTER TOO ?Last Labs   ?      ?Recent Labs  ?Lab 03/09/22 ?2120 03/10/22 ?0508 03/11/22 ?0359 03/12/22 ?0357  ?WBC 9.4 8.7 7.2 6.4  ?   ?Stage IV endometrial cancer with metastasis to lung right rectus  muscle-now with progressive disease based on CT scan above.Oncology following, for radiation oncology treatment today ? ?Recent left MCA territory infarct on 02/17/2022 without residual deficit on aspirin  Plavix planned for 21 days-which patient completed 3/26, now only continue aspirin, Lipitor  ? ?Sarcoidosis hx Patient previously on chronic prednisone therapy which was titrated off last year prior to initiation of immunotherapy.Trying another trial of prednisone as #1.  ?  ?Essential hypertension: BP is controlled, cont  HCTZ and bisoprolol ?Mildly Elevated D-dimer- neg CTA and Duplex leg for VTE. ?Hypothyroidism cont Synthroid ?Anemia due to chemotherapy-overall stable.Monitor hemoglobin ?Last Labs   ?      ?Recent Labs  ?Lab 03/09/22 ?2120 03/10/22 ?0508 03/11/22 ?0359 03/12/22 ?0357  ?HGB 11.1* 10.7* 9.6* 9.6*  ?HCT 32.5* 30.6* 27.9* 27.8*  ?   ?  ?Cancer related pain cont oral opiates and on opiates. ?  ?HLD: Continue Lipitor.  ?  ?Class I Obesity:Patient's Body mass index is 32.03 kg/m?. : Will benefit with PCP follow-up, weight loss  healthy lifestyle and outpatient sleep evaluation ? ? Consults: ?Oncology,rad onc ? ?Subjective: ?Aaox3. Resting well ? ?Discharge Exam: ?Vitals:  ? 03/12/22 1148 03/12/22 1150  ?BP:    ?Pulse: 79   ?Resp:    ?Temp:    ?SpO2: 92% 94%  ? ?General: Pt is alert, awake, not in acute distress ?Cardiovascular: RRR, S1/S2 +, no rubs, no gallops ?Respiratory: CTA bilaterally, no wheezing, no rhonchi ?Abdominal: Soft, NT, ND, bowel sounds + ?Extremities: no edema, no cyanosis ? ?Discharge Instructions ? ?Discharge Instructions   ? ? Discharge instructions   Complete by: As directed ?  ? Please follow up with Dr Alvy Bimler regarding your steroid taper and DO NOT STOP until it is tapered off by Dr Alvy Bimler ? ?Please call call MD or return to ER for similar or worsening recurring problem that brought you to hospital or if any fever,nausea/vomiting,abdominal pain, uncontrolled pain, chest pain,  shortness of breath or any other alarming symptoms. ? ?Please follow-up your doctor as instructed in a week time and call the office for appointment. ? ?Please avoid alcohol, smoking, or any other  illicit substance and maintain healthy habits including taking your regular medications as prescribed. ? ?You were cared for by a hospitalist during your hospital stay. If you have any questions about your discharge medications or the care you received while you were in the hospital after you are discharged, you can call the unit and ask to speak with the hospitalist on call if the hospitalist that took care of you is not available. ? ?Once you are discharged, your primary care physician will handle any further medical issues. Please note that NO REFILLS for any discharge medications will be authorized once you are discharged, as it is imperative that you return to your primary care physician (or establish a relationship with a primary care physician if you do not have one) for your aftercare needs so that they can reassess your need for medications and monitor your lab values  ? Increase activity slowly   Complete by: As directed ?  ? ?  ? ?Allergies as of 03/12/2022   ? ?   Reactions  ? Shellfish Allergy Hives, Itching, Swelling  ? Penicillins Hives, Itching  ? Has patient had a PCN reaction causing immediate rash, facial/tongue/throat swelling, SOB or lightheadedness with hypotension: Yes ?Has patient had a PCN reaction causing severe rash involving mucus membranes or skin necrosis: Yes ?Has patient had a PCN reaction that required  hospitalization: Yes ?Has patient had a PCN reaction occurring within the last 10 years: No ?If all of the above answers are "NO", then may proceed with Cephalosporin use.  ? ?  ? ?  ?Medication List  ?  ? ?STOP taking these medications   ? ?clopidogrel 75 MG tablet ?Commonly known as: Plavix ?  ? ?  ? ?TAKE these medications   ? ?acetaminophen 500 MG tablet ?Commonly known as: TYLENOL ?Take 500 mg by mouth every 6 (six) hours as needed for mild pain. ?  ?albuterol 108 (90 Base) MCG/ACT inhaler ?Commonly known as: ProAir HFA ?2 puffs every 4 hours as needed only  if your can't catch  your breath ?What changed:  ?how much to take ?how to take this ?when to take this ?reasons to take this ?additional instructions ?  ?aspirin 81 MG EC tablet ?Take 1 tablet (81 mg total) by mouth daily. Swallow

## 2022-03-12 NOTE — Progress Notes (Signed)
?PROGRESS NOTE ?[name]  EBR:[id number] DOB: [birth year] DOA: 03/09/2022 ?PCP: [name], [name], MD  ? ?Brief Narrative/Hospital Course: ?66 y.o. 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 ?In the ED vitals with tachycardia tachypnea afebrile, hypoxic up to 88% on room air needing 2 L oxygen Labs showed stable WBC count, stable renal function and normal LFTs 2.8 D-dimer 0.9 COVID-19 negative blood culture ordered chest x-ray right lung base opacity, patient was placed on IV antibiotics supplemental oxygen and admitted.  She had CT of the chest no PE but progressive consolidation within the right lung with moderate effusion which is in part is due to postobstructive changes related to narrowing of the bronchial tree by enlarging hilar adenopathy, a stable left lung nodules and a few new right-sided nodules consistent with progressive disease.  ?  ?Subjective: ?Seen and examined this morning.  Resting comfortably in the bedside chair still needing oxygen and some shortness of breath.  Reports her radiation oncology feels she needs to be here today.   ? ?Assessment and Plan: ?Principal Problem: ?  Acute respiratory failure with hypoxia ([place]) ?Active Problems: ?  Sarcoidosis ([place]) ?  Endometrial cancer ([place]) ?  History of CVA (cerebrovascular accident) ?  Essential hypertension ?  Hyperlipidemia, mixed ?  Cancer associated pain ?  Anemia due to antineoplastic chemotherapy ?  Acquired hypothyroidism ?  ?Acute respiratory failure with hypoxia ?Progressive metastatic disease on the CTA ?Postobstructive changes with progressive consolidation right lung ?Right-sided pleural  effusion-only 50 mL was able to be tapped and thoracentesis 3/27: ?Continue antibiotic for postobstructive pneumonia. CT also shows progressive disease which is likely contributing most of her symptoms-could be too early to see palliative chemo response.  Seen by oncology this morning starting another trial of prednisone therapy as previously she had marked improvement on prednisone.  I had attempted thoracentesis 3/27. Continue bronchodilators, supplemental oxygen-we will ambulate to qualify for home oxygen today.  For radiotherapy evaluation at 4:00 today ?Recent Labs  ?Lab 03/09/22 ?2120 03/10/22 ?0508 03/11/22 ?0359 03/12/22 ?0357  ?WBC 9.4 8.7 7.2 6.4  ? ?  ?Stage IV endometrial cancer with metastasis to lung right rectus muscle-now with progressive disease based on CT scan above.  Oncology following, for radiation oncology treatment today ? ?Recent left MCA territory infarct on 02/17/2022 without residual deficit on aspirin Plavix planned for 21 days-which patient completed 3/26, now only continue aspirin, Lipitor  ? ?Sarcoidosis hx Patient previously on chronic prednisone therapy which was titrated off last year prior to initiation of immunotherapy.  Trying another trial of prednisone as #1.  ? ?Essential hypertension: BP is controlled, cont  HCTZ and bisoprolol ?Mildly Elevated D-dimer- neg CTA and Duplex leg for VTE. ?Hypothyroidism cont Synthroid ?Anemia due to chemotherapy-overall stable.Monitor hemoglobin ?Recent Labs  ?Lab 03/09/22 ?2120 03/10/22 ?0508 03/11/22 ?0359 03/12/22 ?0357  ?HGB 11.1* 10.7* 9.6* 9.6*  ?HCT 32.5* 30.6* 27.9* 27.8*  ? ?  ?Cancer related pain cont oral opiates and on opiates. ? ?HLD: Continue Lipitor.  ? ?Class I Obesity:Patient's Body mass index is 32.03 kg/m?. : Will benefit with PCP follow-up, weight loss  healthy lifestyle and outpatient sleep evaluation. ? ? ?DVT prophylaxis: enoxaparin (LOVENOX) injection 40 mg Start: 03/10/22 1000 ?Code Status:   Code Status:  Full  Code ?Family Communication: plan of care discussed with patient/daughter and multiple family members at bedside. ?Patient status is: Inpatient due to ongoing respiratory issue,Telemetry  ?Patient currently not stable ? ?Dispo: The patient is from: home ?           Anticipated disposition: home likely tomorrow once cleared by radiation oncology/hem onc ? ?Mobility Assessment (last 72 hours)   ? ? Mobility Assessment   ? ? [place] Name 03/11/22 1924 03/10/22 2130 03/10/22 0830 03/10/22 0139  ?  ? Does patient have an order for bedrest or is patient medically unstable No - Continue assessment No - Continue assessment No - Continue assessment No - Continue assessment   ? What is the highest level of mobility based on the progressive mobility assessment? Level 6 (Walks independently in room and hall) - Balance while walking in room without assist - Complete Level 5 (Walks with assist in room/hall) - Balance while stepping forward/back and can walk in room with assist - Complete Level 5 (Walks with assist in room/hall) - Balance while stepping forward/back and can walk in room with assist - Complete Level 5 (Walks with assist in room/hall) - Balance while stepping forward/back and can walk in room with assist - Complete   ? ?  ?  ? ?  ?  ? ?Objective: ?Vitals last 24 hrs: ?Vitals:  ? 03/12/22 0634 03/12/22 0809 03/12/22 1136 03/12/22 1142  ?BP: 120/65 130/63    ?Pulse: 65 66 65 67  ?Resp: 18 20    ?Temp: 97.9 ?F (36.6 ?C)     ?TempSrc: Oral     ?SpO2: 97% 99% 100% 94%  ?Weight:      ?Height:      ? ?Weight change: 0.3 kg ? ?Physical Examination: ?General exam: AA OX3, pleasant,older than stated age, weak appearing. ?HEENT:Oral mucosa moist, Ear/Nose WNL grossly, dentition normal. ?Respiratory system: Slightly diminished on the right lung base  ?Cardiovascular system: S1 & S2 +, No JVD,. ?Gastrointestinal system: Abdomen soft,NT,ND, BS+ ?Nervous System:Alert, awake, moving extremities and grossly nonfocal ?Extremities: edema  neg,distal peripheral pulses palpable.  ?Skin: No rashes,no icterus. ?MSK: Normal muscle bulk,tone, power ? ?Medications reviewed:  ?Scheduled Meds: ? aspirin EC  81 mg Oral Daily  ? atorvastatin  80 mg Oral Daily  ? azithromycin  500 mg Oral QHS  ? bisoprolol  10 mg Oral BID  ? Chlorhexidine Gluconate Cloth  6 each Topical Daily  ? diphenhydrAMINE  25 mg Oral QHS  ? enoxaparin (LOVENOX) injection  40 mg Subcutaneous Q24H  ? famotidine  20 mg Oral Daily  ? guaiFENesin  600 mg Oral BID  ? hydrochlorothiazide  25 mg Oral Daily  ? levothyroxine  100 mcg Oral Q0600  ? mometasone-formoterol  2 puff Inhalation BID  ? predniSONE  40 mg Oral Q breakfast  ? sodium chloride flush  10-40 mL Intracatheter Q12H  ? sodium chloride flush  3 mL Intravenous Q12H  ? ?Continuous Infusions: ? cefTRIAXone (ROCEPHIN)  IV Stopped (03/11/22 2249)  ? ? ?  ?Diet Order   ? ?       ?  Diet regular Room service appropriate? Yes; Fluid consistency: Thin  Diet effective now       ?  ? ?  ?  ? ?  ?  ? ?  ?  ?  ? ? ?Intake/Output Summary (Last 24 hours) at 03/12/2022 1146 ?Last data filed at 03/12/2022 [phone] ?Gross per 24 hour  ?Intake 1280 ml  ?Output  1850 ml  ?Net -570 ml  ? ? ?Net IO Since Admission: 1,059 mL [03/12/22 1146]  ?Wt Readings from Last 3 Encounters:  ?03/12/22 87.3 kg  ?03/05/22 88.4 kg  ?02/27/22 88.5 kg  ?  ? ?Unresulted Labs (From admission, onward)  ? ?  Start     Ordered  ? 03/11/22 1601  Basic metabolic panel  Daily,   R     ?Question:  Specimen collection method  Answer:  Lab=Lab collect  ? 03/10/22 1151  ? 03/11/22 0500  CBC  Daily,   R     ?Question:  Specimen collection method  Answer:  Lab=Lab collect  ? 03/10/22 1151  ? 03/10/22 0008  Strep pneumoniae urinary antigen  (COPD / Pneumonia / Cellulitis / Lower Extremity Wound)  Once,   R       ? 03/10/22 0009  ? 03/10/22 0008  Legionella Pneumophila Serogp 1 Ur Ag  (COPD / Pneumonia / Cellulitis / Lower Extremity Wound)  Once,   R       ? 03/10/22 0009  ? ?  ?  ? ?  ?Data  Reviewed: I have personally reviewed following labs and imaging studies ?CBC: ?Recent Labs  ?Lab April 01, 2022 ?2120 03/10/22 ?0508 03/11/22 ?0359 03/12/22 ?0357  ?WBC 9.4 8.7 7.2 6.4  ?NEUTROABS 7.2  --   --   --   ?HGB 11.1* 10.

## 2022-03-12 NOTE — Plan of Care (Signed)
  Problem: Activity: Goal: Ability to tolerate increased activity will improve Outcome: Progressing   Problem: Respiratory: Goal: Ability to maintain adequate ventilation will improve Outcome: Progressing   Problem: Respiratory: Goal: Ability to maintain a clear airway will improve Outcome: Progressing   

## 2022-03-12 NOTE — Progress Notes (Signed)
?Radiation Oncology         [phone] ?________________________________ ? ?Inpatient Re-Consultation ? ?Name: Shann Lewellyn MRN: [id number]  ?Date: 03/12/2022  DOB: 15-Aug-1956 ? ?VV:OHYWVP, Malka So, MD  [name], MD  ? ?REFERRING PHYSICIAN: [name], MD ? ?DIAGNOSIS: Metastatic endometrial cancer to the lung area ? ?Stage IB grade 1 carcinoma of endometrium with high/intermediate risk factors (deep myo invasion, LVSI) ? ?Metastatic endometrial cancer diagnosed in June 2021; colon, left lung, intrapelvic lymph nodes.  ? ?New evidence of right lung mets: 03/05/22 admission  ? ?Interval Since Last Radiation:  3 years, 5 months, and 25 days  ? ?08/19/18 - 09/17/18: Vaginal Cuff, 3 cm cylinder with treatment length of 3 cm / 30 Gy delivered in 5 fractions of 6 Gy, Brachytherapy / HDR, Iridium-192 ?  ?05/2005: Left Chest Wall keloid ?  ?04/2004 - 05/2004: Anus, pelvis /inguinal ( Dr. Tammi Klippel) ? ?HISTORY OF PRESENT ILLNESS::Carolan Schaafsma is a 66 y.o. female who is currently admitted and is accompanied by her daughter. she is seen as a courtesy of Dr. Alvy Bimler for re-evaluation and an opinion concerning radiation therapy as part of management for recent evidence of pulmonary disease progression (right lung). I last met with the patient for follow-up on 02/10/20. Since she was last seen, the patient unfortunately developed recurrence of disease and metastatic disease in June of 2021. PET on 05/18/20 showed various metastases (intrapelvic lymph nodes, left lung, colon). The patient accordingly pursued further chemotherapy under Dr. Jacklynn Lewis.  Following 2 rounds of chemotherapy on 05/26/20 through 09/27/20 and 02/02/21 through 07/13/21, her disease remained stable until her recent occurrence of disease progression. Her recent subsequent history is as follows.  ? ?The patient presented to the ED on 03/09/22 with progressive SOB and exertional dyspnea x 5 days, and associated nonproductive cough. The patient also endorsed some  occasional swelling to her legs and chest tightness, but denied any chest pain. Upon ED evaluation; SPO2 down to 88% on room air, and she is currently stable on 2 L via [place]. Labs showed elevated D-dimer. (Encounter notes also reiterated her known pulmonary metastases and ongoing chemotherapy). CXR performed for evaluation in the ED showed a right lung base opacity, noted as possibly representative of a combination of PE and associated atelectasis or infiltrate. Bilateral hilar fullness was also appreciated. She was accordingly given IV ceftriaxone and azithromycin, and admitted for further evaluation and management.  ? ?CTA of the chest on 03/10/22 re-demonstrated the progressive consolidation within the right lung as described, with associated moderate effusion (noted as partially related to postobstructive changes related to narrowing of the bronchial tree by enlarging hilar adenopathy). CT also showed: stable left lung nodules; a few new right-sided nodules; and an enlarging nodule in the medial aspect of the right upper lobe. Overall, these changes are consistent with disease progression.  ? ?Today, the patient underwent thoracentesis which removed approximately 50 cc's of dark red/bloody pleural fluid. (Post-op CXR negative for pneumothorax). Pleural fluid has been sent for labs. Results are pending at this time. ? ?The patient had been on [place] for treatment of her endometrial cancer. Treatment has recently been changed to carboplatin on 03/05/22 (Dr. Alvy Bimler) just prior to onset of her symptoms. Per her most recent follow-up visit with Dr. Alvy Bimler on 03/05/22, the patient was noted to be not feeling well in general. She endorsed occasional cough, excessive fatigue (despite near normal hemoglobin) SOB on minimal exertion, and reported recently injuring her elbow which is causing her significant pain (  Dr. Alvy Bimler prescribed oxycodone for this). ? 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  She was started on DAPT for 21 days. She was also instructed to take aspirin and Plavix for a total of 21 days, and to continue on atorvastatin.  ? ?Not of significant breathing issues the patient is now referred to radiation oncology for consideration for palliative treatments. ? ? ?PAST MEDICAL HISTORY:  ?Past Medical History:  ?Diagnosis Date  ? Bifid uvula   ? Blood transfusion without reported diagnosis   ? Endometrial ca [facility]) dx'd 06/2018  ? chemo  ? GERD (gastroesophageal reflux disease)   ? History of cervical dysplasia   ? History of chemotherapy   ? 06/ 2005  ? History of cleft lip   ? CORRECTED  ? History of condyloma acuminatum   ? PERIANAL  ? History of radiation therapy   ? completed 06/ 2005  ? History of vulvar dysplasia   ? Hypertension   ? 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 Pulmonary nodule, left   ? 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 PULMOLOGIST-  DR Melvyn Novas--  CHRONIC PREDNISONE  ? Sarcoid uveitis of left eye goes to [facility]  ? intermittantly--  currently no issues per pt 11-27-2016  ? 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 S/P  local resection 03/2004.  ChemoXRT 2005 Dr Collier Salina Rubin/[facility]  ? Upper airway cough syndrome   ? Wears glasses   ? ? ?PAST SURGICAL HISTORY: ?Past Surgical History:  ?Procedure Laterality Date  ? BRONCHOSCOPY  01/22/2008  ? w/ Lavage and bronchial bx  ? CARDIOVASCULAR STRESS TEST  06/27/2006  ? normal nuclear study w/ no ischemia/  normal LV function and wall motion , ef 61%  ? CARPAL TUNNEL RELEASE Right 01/17/2016  ? and finger trigger release  ? CLEFT LIP REPAIR    ? CO2 LASER ABLATION VULVA    ? EUA/  EXCISIONAL MULTIPLE PERINEAL/ANAL BX'S/  SIGMOIDSCOPY/  PORT-A-CATH PLACEMENT  03/27/2004  ? Local excision - Dr Lennie Hummer  ? IR IMAGING GUIDED PORT INSERTION  05/26/2020  ? LAPAROSCOPIC CHOLECYSTECTOMY  08/14/2008  ? MULTIPLE ANAL BX'S  10/10/2006  ? RELEASE A1  PULLEY THUMB Bilateral left 12-17-2006/  right 09-05-2009  ? REMOVAL PORT-A-CATH/  EUA/  MULTIPLE ANAL BX'S  10/16/2004  ? ROBOTIC ASSISTED TOTAL HYSTERECTOMY WITH BILATERAL SALPINGO OOPHERECTOMY Bilateral 06/25/2018  ? Procedure: XI ROBOTIC ASSISTED TOTAL HYSTERECTOMY WITH BILATERAL SALPINGO OOPHORECTOMY;  Surgeon: Everitt Amber, MD;  Location: [facility];  Service: Gynecology;  Laterality: Bilateral;  ? SENTINEL NODE BIOPSY N/A 06/25/2018  ? Procedure: SENTINEL NODE BIOPSY;  Surgeon: Everitt Amber, MD;  Location: [facility];  Service: Gynecology;  Laterality: N/A;  ? TENNIS ELBOW RELEASE/NIRSCHEL PROCEDURE Right 11/28/2016  ? Procedure: RIGHT ELBOW ULNER NERVE RELEASE AND OR TRANSPOSITION;  Surgeon: Iran Planas, MD;  Location: [facility];  Service: Orthopedics;  Laterality: Right;  ? ? ?FAMILY HISTORY:  ?Family History  ?Problem Relation Age of Onset  ? Allergies Mother   ? Asthma Mother   ? Lung cancer Mother   ? Allergies Sister   ? Allergies Brother   ? Asthma Brother   ? Asthma Sister   ? Sarcoidosis Neg Hx   ? Colon cancer Neg Hx   ? Liver cancer Neg Hx   ? Stomach cancer Neg Hx   ?  Esophageal cancer Neg Hx   ? Rectal cancer Neg Hx   ? ? ?SOCIAL HISTORY:  ?Social History  ? ?Tobacco Use  ? Smoking status: Former  ?  Packs/day: 0.50  ?  Years: 1.00  ?  Pack years: 0.50  ?  Types: Cigarettes  ?  Quit date: 12/16/1993  ?  Years since quitting: 28.2  ? Smokeless tobacco: Never  ?Vaping Use  ? Vaping Use: Never used  ?Substance Use Topics  ? Alcohol use: Yes  ?  Comment: occasional  ? Drug use: No  ? ? ?ALLERGIES:  ?Allergies  ?Allergen Reactions  ? Shellfish Allergy Hives, Itching and Swelling  ? Penicillins Hives and Itching  ?  Has patient had a PCN reaction causing immediate rash, facial/tongue/throat swelling, SOB or lightheadedness with hypotension: Yes ?Has patient had a PCN reaction causing severe rash involving mucus membranes or skin necrosis: Yes ?Has patient had a PCN reaction that required  hospitalization: Yes ?Has patient had a PCN reaction occurring within the last 10 years: No ?If all of the above answers are "NO", then may proceed with Cephalosporin use. ?  ? ? ?MEDICATIONS:  ?Current Outpatient M

## 2022-03-12 NOTE — Plan of Care (Signed)
  Problem: Activity: Goal: Ability to tolerate increased activity will improve Outcome: Progressing   Problem: Clinical Measurements: Goal: Ability to maintain a body temperature in the normal range will improve Outcome: Progressing   Problem: Respiratory: Goal: Ability to maintain adequate ventilation will improve Outcome: Progressing Goal: Ability to maintain a clear airway will improve Outcome: Progressing   

## 2022-03-13 ENCOUNTER — Other Ambulatory Visit: Payer: Self-pay

## 2022-03-13 ENCOUNTER — Ambulatory Visit
Admission: RE | Admit: 2022-03-13 | Discharge: 2022-03-13 | Disposition: A | Discharge status: Home / Self Care | Payer: Medicare HMO | Source: Ambulatory Visit | Attending: Radiation Oncology | Admitting: Radiation Oncology

## 2022-03-13 DIAGNOSIS — Z510 Encounter for antineoplastic radiation therapy: Secondary | ICD-10-CM | POA: Diagnosis not present

## 2022-03-13 DIAGNOSIS — C7802 Secondary malignant neoplasm of left lung: Secondary | ICD-10-CM | POA: Diagnosis not present

## 2022-03-13 DIAGNOSIS — C772 Secondary and unspecified malignant neoplasm of intra-abdominal lymph nodes: Secondary | ICD-10-CM | POA: Diagnosis not present

## 2022-03-13 DIAGNOSIS — C7801 Secondary malignant neoplasm of right lung: Secondary | ICD-10-CM | POA: Diagnosis not present

## 2022-03-13 DIAGNOSIS — C541 Malignant neoplasm of endometrium: Secondary | ICD-10-CM | POA: Diagnosis not present

## 2022-03-14 ENCOUNTER — Ambulatory Visit
Admission: RE | Admit: 2022-03-14 | Discharge: 2022-03-14 | Disposition: A | Discharge status: Home / Self Care | Payer: Medicare HMO | Source: Ambulatory Visit | Attending: Radiation Oncology | Admitting: Radiation Oncology

## 2022-03-14 ENCOUNTER — Other Ambulatory Visit (HOSPITAL_COMMUNITY): Payer: Self-pay

## 2022-03-14 DIAGNOSIS — C7802 Secondary malignant neoplasm of left lung: Secondary | ICD-10-CM | POA: Diagnosis not present

## 2022-03-14 DIAGNOSIS — C541 Malignant neoplasm of endometrium: Secondary | ICD-10-CM | POA: Diagnosis not present

## 2022-03-14 DIAGNOSIS — C7801 Secondary malignant neoplasm of right lung: Secondary | ICD-10-CM | POA: Diagnosis not present

## 2022-03-14 DIAGNOSIS — C772 Secondary and unspecified malignant neoplasm of intra-abdominal lymph nodes: Secondary | ICD-10-CM | POA: Diagnosis not present

## 2022-03-14 DIAGNOSIS — Z510 Encounter for antineoplastic radiation therapy: Secondary | ICD-10-CM | POA: Diagnosis not present

## 2022-03-15 ENCOUNTER — Other Ambulatory Visit: Payer: Self-pay

## 2022-03-15 ENCOUNTER — Ambulatory Visit
Admission: RE | Admit: 2022-03-15 | Discharge: 2022-03-15 | Disposition: A | Discharge status: Home / Self Care | Payer: Medicare HMO | Source: Ambulatory Visit | Attending: Radiation Oncology | Admitting: Radiation Oncology

## 2022-03-15 DIAGNOSIS — C7802 Secondary malignant neoplasm of left lung: Secondary | ICD-10-CM | POA: Diagnosis not present

## 2022-03-15 DIAGNOSIS — C541 Malignant neoplasm of endometrium: Secondary | ICD-10-CM | POA: Diagnosis not present

## 2022-03-15 DIAGNOSIS — C7801 Secondary malignant neoplasm of right lung: Secondary | ICD-10-CM | POA: Diagnosis not present

## 2022-03-15 DIAGNOSIS — C772 Secondary and unspecified malignant neoplasm of intra-abdominal lymph nodes: Secondary | ICD-10-CM | POA: Diagnosis not present

## 2022-03-15 DIAGNOSIS — Z510 Encounter for antineoplastic radiation therapy: Secondary | ICD-10-CM | POA: Diagnosis not present

## 2022-03-15 LAB — CULTURE, BLOOD (ROUTINE X 2)
Culture: NO GROWTH
Culture: NO GROWTH
Special Requests: ADEQUATE
Special Requests: ADEQUATE

## 2022-03-18 ENCOUNTER — Other Ambulatory Visit: Payer: Self-pay

## 2022-03-18 ENCOUNTER — Ambulatory Visit
Admission: RE | Admit: 2022-03-18 | Discharge: 2022-03-18 | Disposition: A | Discharge status: Home / Self Care | Payer: Medicare HMO | Source: Ambulatory Visit | Attending: Radiation Oncology | Admitting: Radiation Oncology

## 2022-03-18 DIAGNOSIS — C7802 Secondary malignant neoplasm of left lung: Secondary | ICD-10-CM | POA: Insufficient documentation

## 2022-03-18 DIAGNOSIS — Z510 Encounter for antineoplastic radiation therapy: Secondary | ICD-10-CM | POA: Insufficient documentation

## 2022-03-18 DIAGNOSIS — C7801 Secondary malignant neoplasm of right lung: Secondary | ICD-10-CM | POA: Diagnosis not present

## 2022-03-18 DIAGNOSIS — C772 Secondary and unspecified malignant neoplasm of intra-abdominal lymph nodes: Secondary | ICD-10-CM | POA: Diagnosis not present

## 2022-03-18 DIAGNOSIS — C541 Malignant neoplasm of endometrium: Secondary | ICD-10-CM | POA: Diagnosis not present

## 2022-03-19 ENCOUNTER — Telehealth: Payer: Self-pay | Admitting: Oncology

## 2022-03-19 ENCOUNTER — Inpatient Hospital Stay: Payer: Medicare HMO | Admitting: Hematology and Oncology

## 2022-03-19 ENCOUNTER — Ambulatory Visit
Admission: RE | Admit: 2022-03-19 | Discharge: 2022-03-19 | Disposition: A | Discharge status: Home / Self Care | Payer: Medicare HMO | Source: Ambulatory Visit | Attending: Radiation Oncology | Admitting: Radiation Oncology

## 2022-03-19 ENCOUNTER — Inpatient Hospital Stay: Payer: Medicare HMO

## 2022-03-19 DIAGNOSIS — C772 Secondary and unspecified malignant neoplasm of intra-abdominal lymph nodes: Secondary | ICD-10-CM | POA: Diagnosis not present

## 2022-03-19 DIAGNOSIS — C7801 Secondary malignant neoplasm of right lung: Secondary | ICD-10-CM | POA: Diagnosis not present

## 2022-03-19 DIAGNOSIS — C7802 Secondary malignant neoplasm of left lung: Secondary | ICD-10-CM | POA: Diagnosis not present

## 2022-03-19 DIAGNOSIS — C541 Malignant neoplasm of endometrium: Secondary | ICD-10-CM | POA: Diagnosis not present

## 2022-03-19 DIAGNOSIS — Z510 Encounter for antineoplastic radiation therapy: Secondary | ICD-10-CM | POA: Diagnosis not present

## 2022-03-19 NOTE — Telephone Encounter (Signed)
Called [name] and advised her that appointments for lab and Dr. Alvy Bimler have been canceled today.  [name] said she was not aware of appointments for today.  Reviewed appointments for 03/26/22 for port flush/labs, follow up with Dr. Alvy Bimler and infusion.  She verbalized understanding and agreement. ?

## 2022-03-20 ENCOUNTER — Ambulatory Visit
Admission: RE | Admit: 2022-03-20 | Discharge: 2022-03-20 | Disposition: A | Discharge status: Home / Self Care | Payer: Medicare HMO | Source: Ambulatory Visit | Attending: Radiation Oncology | Admitting: Radiation Oncology

## 2022-03-20 ENCOUNTER — Other Ambulatory Visit: Payer: Self-pay

## 2022-03-20 DIAGNOSIS — C541 Malignant neoplasm of endometrium: Secondary | ICD-10-CM | POA: Diagnosis not present

## 2022-03-20 DIAGNOSIS — Z510 Encounter for antineoplastic radiation therapy: Secondary | ICD-10-CM | POA: Diagnosis not present

## 2022-03-20 DIAGNOSIS — C7801 Secondary malignant neoplasm of right lung: Secondary | ICD-10-CM | POA: Diagnosis not present

## 2022-03-20 DIAGNOSIS — C7802 Secondary malignant neoplasm of left lung: Secondary | ICD-10-CM | POA: Diagnosis not present

## 2022-03-20 DIAGNOSIS — C772 Secondary and unspecified malignant neoplasm of intra-abdominal lymph nodes: Secondary | ICD-10-CM | POA: Diagnosis not present

## 2022-03-21 ENCOUNTER — Other Ambulatory Visit: Payer: Self-pay

## 2022-03-21 ENCOUNTER — Ambulatory Visit
Admission: RE | Admit: 2022-03-21 | Discharge: 2022-03-21 | Disposition: A | Discharge status: Home / Self Care | Payer: Medicare HMO | Source: Ambulatory Visit | Attending: Radiation Oncology | Admitting: Radiation Oncology

## 2022-03-21 DIAGNOSIS — Z510 Encounter for antineoplastic radiation therapy: Secondary | ICD-10-CM | POA: Diagnosis not present

## 2022-03-21 DIAGNOSIS — C541 Malignant neoplasm of endometrium: Secondary | ICD-10-CM | POA: Diagnosis not present

## 2022-03-21 DIAGNOSIS — C772 Secondary and unspecified malignant neoplasm of intra-abdominal lymph nodes: Secondary | ICD-10-CM | POA: Diagnosis not present

## 2022-03-21 DIAGNOSIS — C7802 Secondary malignant neoplasm of left lung: Secondary | ICD-10-CM | POA: Diagnosis not present

## 2022-03-21 DIAGNOSIS — C7801 Secondary malignant neoplasm of right lung: Secondary | ICD-10-CM | POA: Diagnosis not present

## 2022-03-22 ENCOUNTER — Ambulatory Visit
Admission: RE | Admit: 2022-03-22 | Discharge: 2022-03-22 | Disposition: A | Discharge status: Home / Self Care | Payer: Medicare HMO | Source: Ambulatory Visit | Attending: Radiation Oncology | Admitting: Radiation Oncology

## 2022-03-22 DIAGNOSIS — Z510 Encounter for antineoplastic radiation therapy: Secondary | ICD-10-CM | POA: Diagnosis not present

## 2022-03-22 DIAGNOSIS — C772 Secondary and unspecified malignant neoplasm of intra-abdominal lymph nodes: Secondary | ICD-10-CM | POA: Diagnosis not present

## 2022-03-22 DIAGNOSIS — C7802 Secondary malignant neoplasm of left lung: Secondary | ICD-10-CM | POA: Diagnosis not present

## 2022-03-22 DIAGNOSIS — C541 Malignant neoplasm of endometrium: Secondary | ICD-10-CM | POA: Diagnosis not present

## 2022-03-22 DIAGNOSIS — C7801 Secondary malignant neoplasm of right lung: Secondary | ICD-10-CM | POA: Diagnosis not present

## 2022-03-25 ENCOUNTER — Other Ambulatory Visit: Payer: Self-pay

## 2022-03-25 ENCOUNTER — Ambulatory Visit
Admission: RE | Admit: 2022-03-25 | Discharge: 2022-03-25 | Disposition: A | Discharge status: Home / Self Care | Payer: Medicare HMO | Source: Ambulatory Visit | Attending: Radiation Oncology | Admitting: Radiation Oncology

## 2022-03-25 DIAGNOSIS — C7802 Secondary malignant neoplasm of left lung: Secondary | ICD-10-CM | POA: Diagnosis not present

## 2022-03-25 DIAGNOSIS — C772 Secondary and unspecified malignant neoplasm of intra-abdominal lymph nodes: Secondary | ICD-10-CM | POA: Diagnosis not present

## 2022-03-25 DIAGNOSIS — C541 Malignant neoplasm of endometrium: Secondary | ICD-10-CM | POA: Diagnosis not present

## 2022-03-25 DIAGNOSIS — C7801 Secondary malignant neoplasm of right lung: Secondary | ICD-10-CM | POA: Diagnosis not present

## 2022-03-25 DIAGNOSIS — Z510 Encounter for antineoplastic radiation therapy: Secondary | ICD-10-CM | POA: Diagnosis not present

## 2022-03-26 ENCOUNTER — Inpatient Hospital Stay (HOSPITAL_BASED_OUTPATIENT_CLINIC_OR_DEPARTMENT_OTHER): Payer: Medicare HMO | Admitting: Hematology and Oncology

## 2022-03-26 ENCOUNTER — Inpatient Hospital Stay: Payer: Medicare HMO | Attending: Hematology and Oncology

## 2022-03-26 ENCOUNTER — Encounter: Payer: Self-pay | Admitting: Hematology and Oncology

## 2022-03-26 ENCOUNTER — Inpatient Hospital Stay: Payer: Medicare HMO

## 2022-03-26 ENCOUNTER — Telehealth: Payer: Self-pay | Admitting: Oncology

## 2022-03-26 DIAGNOSIS — R634 Abnormal weight loss: Secondary | ICD-10-CM | POA: Insufficient documentation

## 2022-03-26 DIAGNOSIS — C541 Malignant neoplasm of endometrium: Secondary | ICD-10-CM | POA: Insufficient documentation

## 2022-03-26 DIAGNOSIS — Z8673 Personal history of transient ischemic attack (TIA), and cerebral infarction without residual deficits: Secondary | ICD-10-CM

## 2022-03-26 DIAGNOSIS — C7802 Secondary malignant neoplasm of left lung: Secondary | ICD-10-CM

## 2022-03-26 DIAGNOSIS — G893 Neoplasm related pain (acute) (chronic): Secondary | ICD-10-CM | POA: Diagnosis not present

## 2022-03-26 DIAGNOSIS — D61818 Other pancytopenia: Secondary | ICD-10-CM

## 2022-03-26 DIAGNOSIS — R058 Other specified cough: Secondary | ICD-10-CM

## 2022-03-26 DIAGNOSIS — R5381 Other malaise: Secondary | ICD-10-CM

## 2022-03-26 DIAGNOSIS — Z5111 Encounter for antineoplastic chemotherapy: Secondary | ICD-10-CM | POA: Diagnosis not present

## 2022-03-26 DIAGNOSIS — C775 Secondary and unspecified malignant neoplasm of intrapelvic lymph nodes: Secondary | ICD-10-CM

## 2022-03-26 LAB — CBC WITH DIFFERENTIAL (CANCER CENTER ONLY)
Abs Immature Granulocytes: 0.02 10*3/uL (ref 0.00–0.07)
Basophils Absolute: 0 10*3/uL (ref 0.0–0.1)
Basophils Relative: 0 %
Eosinophils Absolute: 0 10*3/uL (ref 0.0–0.5)
Eosinophils Relative: 1 %
HCT: 30.6 % — ABNORMAL LOW (ref 36.0–46.0)
Hemoglobin: 10.9 g/dL — ABNORMAL LOW (ref 12.0–15.0)
Immature Granulocytes: 0 %
Lymphocytes Relative: 7 %
Lymphs Abs: 0.4 10*3/uL — ABNORMAL LOW (ref 0.7–4.0)
MCH: 33.6 pg (ref 26.0–34.0)
MCHC: 35.6 g/dL (ref 30.0–36.0)
MCV: 94.4 fL (ref 80.0–100.0)
Monocytes Absolute: 0.8 10*3/uL (ref 0.1–1.0)
Monocytes Relative: 13 %
Neutro Abs: 4.6 10*3/uL (ref 1.7–7.7)
Neutrophils Relative %: 79 %
Platelet Count: 107 10*3/uL — ABNORMAL LOW (ref 150–400)
RBC: 3.24 MIL/uL — ABNORMAL LOW (ref 3.87–5.11)
RDW: 17.1 % — ABNORMAL HIGH (ref 11.5–15.5)
WBC Count: 5.9 10*3/uL (ref 4.0–10.5)
nRBC: 0 % (ref 0.0–0.2)

## 2022-03-26 LAB — BASIC METABOLIC PANEL - CANCER CENTER ONLY
Anion gap: 10 (ref 5–15)
BUN: 28 mg/dL — ABNORMAL HIGH (ref 8–23)
CO2: 27 mmol/L (ref 22–32)
Calcium: 9.2 mg/dL (ref 8.9–10.3)
Chloride: 100 mmol/L (ref 98–111)
Creatinine: 0.98 mg/dL (ref 0.44–1.00)
GFR, Estimated: >60 mL/min (ref >60)
Glucose, Bld: 130 mg/dL — ABNORMAL HIGH (ref 70–99)
Potassium: 3.3 mmol/L — ABNORMAL LOW (ref 3.5–5.1)
Sodium: 137 mmol/L (ref 135–145)

## 2022-03-26 MED ORDER — SODIUM CHLORIDE 0.9 % IV SOLN
407.6000 mg | Freq: Once | INTRAVENOUS | Status: AC
  Administered 2022-03-26: 410 mg via INTRAVENOUS
  Filled 2022-03-26: qty 41

## 2022-03-26 MED ORDER — SODIUM CHLORIDE 0.9% FLUSH
10.0000 mL | INTRAVENOUS | Status: DC | PRN
  Administered 2022-03-26: 10 mL

## 2022-03-26 MED ORDER — DIPHENHYDRAMINE HCL 25 MG PO CAPS
50.0000 mg | ORAL_CAPSULE | Freq: Once | ORAL | Status: AC
  Administered 2022-03-26: 50 mg via ORAL
  Filled 2022-03-26: qty 2

## 2022-03-26 MED ORDER — PALONOSETRON HCL INJECTION 0.25 MG/5ML
0.2500 mg | Freq: Once | INTRAVENOUS | Status: AC
  Administered 2022-03-26: 0.25 mg via INTRAVENOUS
  Filled 2022-03-26: qty 5

## 2022-03-26 MED ORDER — SODIUM CHLORIDE 0.9 % IV SOLN: Freq: Once | INTRAVENOUS | Status: AC

## 2022-03-26 MED ORDER — SODIUM CHLORIDE 0.9% FLUSH
10.0000 mL | Freq: Once | INTRAVENOUS | Status: AC
  Administered 2022-03-26: 10 mL

## 2022-03-26 MED ORDER — SODIUM CHLORIDE 0.9 % IV SOLN
150.0000 mg | Freq: Once | INTRAVENOUS | Status: AC
  Administered 2022-03-26: 150 mg via INTRAVENOUS
  Filled 2022-03-26: qty 150

## 2022-03-26 MED ORDER — OXYCODONE HCL 5 MG PO TABS
10.0000 mg | ORAL_TABLET | Freq: Once | ORAL | Status: AC
  Administered 2022-03-26: 10 mg via ORAL
  Filled 2022-03-26: qty 2

## 2022-03-26 MED ORDER — HEPARIN SOD (PORK) LOCK FLUSH 100 UNIT/ML IV SOLN
500.0000 [IU] | Freq: Once | INTRAVENOUS | Status: AC | PRN
  Administered 2022-03-26: 500 [IU]

## 2022-03-26 MED ORDER — SODIUM CHLORIDE 0.9 % IV SOLN
10.0000 mg | Freq: Once | INTRAVENOUS | Status: AC
  Administered 2022-03-26: 10 mg via INTRAVENOUS
  Filled 2022-03-26: qty 10

## 2022-03-26 MED ORDER — FAMOTIDINE IN NACL 20-0.9 MG/50ML-% IV SOLN
20.0000 mg | Freq: Once | INTRAVENOUS | Status: AC
  Administered 2022-03-26: 20 mg via INTRAVENOUS
  Filled 2022-03-26: qty 50

## 2022-03-26 NOTE — Telephone Encounter (Signed)
[name] called with [name]'s current med list.  Reviewed the med list in Epic and it is correct except that she is not taking Pepcid, imodium, Claritin or vitamin C.  [name] has been filling a pill box for [name] each week to make sure she is taking her medication correctly.  Advised her to have [name] bring in all her pill bottles to her next appointment.   ?

## 2022-03-26 NOTE — Assessment & Plan Note (Signed)
She has nonproductive cough ?She just completed radiation treatment ?Her oxygen saturation is normal ?Her exam reveals some basilar crackles on the right side but otherwise unremarkable ?I suspect this is due to recent radiation treatment ?Observe only for now ?

## 2022-03-26 NOTE — Progress Notes (Signed)
[name] ?OFFICE PROGRESS NOTE ? ?Patient Care Team: ?[name], [name], MD as PCP - General (Family Medicine) ?Awanda Mink Craige Cotta, RN as Oncology Nurse Navigator (Oncology) ? ?ASSESSMENT & PLAN:  ?Endometrial cancer ([place]) ?The patient has completed radiation therapy ?She has not been feeling well since discharge from the hospital ?She have lost approximately 12 pounds of weight ?She does not know what medication she is taking ?She is in pain but she did not take her pain medicine this morning and only took oxycodone twice yesterday ?She could not remember her last bowel movement ?Overall, she has very poor health literacy and decline in performance status due to some component of noncompliance ?We will continue treatment with carboplatin today ?I gave instruction for her son to call us back when she completes her treatment to get an accurate medication list ?I will see her on a weekly basis for supportive care ? ?Cancer associated pain ?She has poorly controlled pain ?She is somewhat noncompliant with treatment recommendation ?She does not know how many more oxycodone she has left at home ?We will continue oxycodone for now ?I recommend she update her medication list and bring all her pill bottles in her next visit ? ?Pancytopenia, acquired ([place]) ?She has multifactorial pancytopenia due to her disease ?She does not need transfusion support ?Observe closely for now ? ?Weight loss, non-intentional ?She has profound weight loss and very poor appetite ?The patient has not been truthful with family members about her oral intake ?I gave her a flow sheet to document her oral intake and will reassess next week ? ?Physical debility ?The cause of her profound debility is multifactorial ?I am not able to help her specifically due to some noncompliance, poor oral intake and possibly incorrect intake of medication ?I will see her weekly for supportive care for now ? ?Non-productive cough ?She has nonproductive  cough ?She just completed radiation treatment ?Her oxygen saturation is normal ?Her exam reveals some basilar crackles on the right side but otherwise unremarkable ?I suspect this is due to recent radiation treatment ?Observe only for now ? ?Orders Placed This Encounter  ?Procedures  ? Comprehensive metabolic panel  ?  Standing Status:   Standing  ?  Number of Occurrences:   45  ?  Standing Expiration Date:   03/27/2023  ? Magnesium  ?  Standing Status:   Standing  ?  Number of Occurrences:   3  ?  Standing Expiration Date:   03/27/2023  ? Sample to Blood Bank  ?  Standing Status:   Standing  ?  Number of Occurrences:   14  ?  Standing Expiration Date:   03/27/2023  ? ? ?All questions were answered. The patient knows to call the clinic with any problems, questions or concerns. ?The total time spent in the appointment was 40 minutes encounter with patients including review of chart and various tests results, discussions about plan of care and coordination of care plan ?  ?[name], MD ?03/26/2022 11:37 AM ? ?INTERVAL HISTORY: ?Please see below for problem oriented charting. ?she returns for treatment follow-up with her son ?She is seen prior to treatment with carboplatin ?Since last time I saw her, she has not been feeling well ?She has lost a lot of weight ?She have nonproductive cough ?She has poorly controlled pain ?Her oral intake in general is poor.  She cannot tell me why she is not eating.  She denies nausea ?She have history of constipation but could not  remember when her last bowel movement was ?Despite being in pain, she did not take her pain medicine this morning.  She recalls taking 2 oxycodone yesterday ?She felt horrible and weak overall ?I did not receive any phone call from her or family members about her condition ?She cannot recall the medication she is taking ?She has been doing incentive spirometry twice a day ?She just completed radiation therapy to her chest ? ?REVIEW OF SYSTEMS:    ?Constitutional: Denies fevers, chills  ?Eyes: Denies blurriness of vision ?Ears, nose, mouth, throat, and face: Denies mucositis or sore throat ?Cardiovascular: Denies palpitation, chest discomfort or lower extremity swelling ?Gastrointestinal:  Denies nausea, heartburn or change in bowel habits ?Skin: Denies abnormal skin rashes ?Lymphatics: Denies new lymphadenopathy or easy bruising ?Behavioral/Psych: Mood is stable, no new changes  ?All other systems were reviewed with the patient and are negative. ? ?I have reviewed the past medical history, past surgical history, social history and family history with the patient and they are unchanged from previous note. ? ?ALLERGIES:  is allergic to shellfish allergy and penicillins. ? ?MEDICATIONS:  ?Current Outpatient Medications  ?Medication Sig Dispense Refill  ? acetaminophen (TYLENOL) 500 MG tablet Take 500 mg by mouth every 6 (six) hours as needed for mild pain.    ? albuterol (PROAIR HFA) 108 (90 Base) MCG/ACT inhaler 2 puffs every 4 hours as needed only  if your can't catch your breath (Patient taking differently: Inhale 2 puffs into the lungs every 4 (four) hours as needed for wheezing or shortness of breath.) 18 g 2  ? aspirin 81 MG EC tablet Take 1 tablet (81 mg total) by mouth daily. Swallow whole. 30 tablet 2  ? atorvastatin (LIPITOR) 80 MG tablet Take 1 tablet (80 mg total) by mouth daily. 30 tablet 2  ? bisoprolol (ZEBETA) 10 MG tablet Take 1 tablet (10 mg total) by mouth in the morning and at bedtime. 60 tablet 1  ? cetirizine (ALLERGY, CETIRIZINE,) 10 MG tablet Take 1 tablet (10 mg total) by mouth daily. (Patient taking differently: Take 10 mg by mouth daily as needed for allergies.) 30 tablet 0  ? desonide (DESOWEN) 0.05 % lotion Apply topically 2 (two) times daily. Continue following with dermatologist (Patient taking differently: Apply 1 application. topically 2 (two) times daily as needed (skin irritation). Continue following with dermatologist) 59 mL  0  ? diphenhydrAMINE (BENADRYL) 25 MG tablet Take 25 mg by mouth every 6 (six) hours as needed for allergies.    ? ELDERBERRY PO Take 1 tablet by mouth daily.    ? famotidine (PEPCID) 20 MG tablet Take 1 tablet (20 mg total) by mouth daily for 10 days. 30 tablet 0  ? hydrochlorothiazide (HYDRODIURIL) 25 MG tablet Take 1 tablet (25 mg total) by mouth daily. 30 tablet 3  ? ketoconazole (NIZORAL) 2 % cream Apply to affected area daily as needed for irritation. 30 g 0  ? levothyroxine (SYNTHROID) 100 MCG tablet Take 1 tablet (100 mcg total) by mouth daily before breakfast. 30 tablet 3  ? lidocaine-prilocaine (EMLA) cream Apply 1 application topically as needed. (Patient taking differently: Apply 1 application. topically as needed (access port).) 30 g 3  ? loperamide (IMODIUM) 2 MG capsule Take 2 mg by mouth daily as needed for diarrhea or loose stools.    ? loratadine (CLARITIN) 10 MG tablet Take 10 mg by mouth daily as needed for allergies.    ? mometasone-formoterol (DULERA) 100-5 MCG/ACT AERO Take 2 puffs first thing  in am and then another 2 puffs about 12 hours later. (Patient taking differently: 2 puffs 2 (two) times daily as needed for wheezing or shortness of breath.) 3 each 3  ? ondansetron (ZOFRAN) 8 MG tablet Take 1 tablet (8 mg total) by mouth every 8 (eight) hours as needed for nausea. (Patient not taking: Reported on 03/09/2022) 30 tablet 3  ? Oxycodone HCl 10 MG TABS Take 1 tablet (10 mg total) by mouth every 4 (four) hours as needed for severe pain. 60 tablet 0  ? pantoprazole (PROTONIX) 40 MG tablet Take 1 tablet (40 mg total) by mouth daily. 30 tablet 0  ? predniSONE (DELTASONE) 20 MG tablet Take 2 tablets (40 mg total) by mouth daily with breakfast. 60 tablet 0  ? prochlorperazine (COMPAZINE) 10 MG tablet TAKE 1 TABLET BY MOUTH EVERY 6 HOURS AS NEEDED FOR NAUSEA OR VOMITING (Patient taking differently: Take 10 mg by mouth every 6 (six) hours as needed for nausea or vomiting.) 60 tablet 3  ?  triamcinolone (NASACORT) 55 MCG/ACT AERO nasal inhaler Place 2 sprays into each nostril daily. (Patient not taking: Reported on 03/09/2022) 16.9 mL 12  ? vitamin C (ASCORBIC ACID) 500 MG tablet Take 500 mg by mouth daily.    ? ?No curr

## 2022-03-26 NOTE — Assessment & Plan Note (Signed)
The cause of her profound debility is multifactorial ?I am not able to help her specifically due to some noncompliance, poor oral intake and possibly incorrect intake of medication ?I will see her weekly for supportive care for now ?

## 2022-03-26 NOTE — Assessment & Plan Note (Signed)
She has poorly controlled pain ?She is somewhat noncompliant with treatment recommendation ?She does not know how many more oxycodone she has left at home ?We will continue oxycodone for now ?I recommend she update her medication list and bring all her pill bottles in her next visit ?

## 2022-03-26 NOTE — Assessment & Plan Note (Signed)
She has multifactorial pancytopenia due to her disease ?She does not need transfusion support ?Observe closely for now ?

## 2022-03-26 NOTE — Patient Instructions (Signed)
[facility]  Discharge Instructions: Thank you for choosing [facility] to provide your oncology and hematology care.   If you have a lab appointment with the Cancer Center, please go directly to the Cancer Center and check in at the registration area.   Wear comfortable clothing and clothing appropriate for easy access to any Portacath or PICC line.   We strive to give you quality time with your provider. You may need to reschedule your appointment if you arrive late (15 or more minutes).  Arriving late affects you and other patients whose appointments are after yours.  Also, if you miss three or more appointments without notifying the office, you may be dismissed from the clinic at the provider's discretion.      For prescription refill requests, have your pharmacy contact our office and allow 72 hours for refills to be completed.    Today you received the following chemotherapy and/or immunotherapy agent: Carboplatin   To help prevent nausea and vomiting after your treatment, we encourage you to take your nausea medication as directed.  BELOW ARE SYMPTOMS THAT SHOULD BE REPORTED IMMEDIATELY: . *FEVER GREATER THAN 100.4 F (38 C) OR HIGHER . *CHILLS OR SWEATING . *NAUSEA AND VOMITING THAT IS NOT CONTROLLED WITH YOUR NAUSEA MEDICATION . *UNUSUAL SHORTNESS OF BREATH . *UNUSUAL BRUISING OR BLEEDING . *URINARY PROBLEMS (pain or burning when urinating, or frequent urination) . *BOWEL PROBLEMS (unusual diarrhea, constipation, pain near the anus) . TENDERNESS IN MOUTH AND THROAT WITH OR WITHOUT PRESENCE OF ULCERS (sore throat, sores in mouth, or a toothache) . UNUSUAL RASH, SWELLING OR PAIN  . UNUSUAL VAGINAL DISCHARGE OR ITCHING   Items with * indicate a potential emergency and should be followed up as soon as possible or go to the Emergency Department if any problems should occur.  Please show the CHEMOTHERAPY ALERT CARD or IMMUNOTHERAPY ALERT  CARD at check-in to the Emergency Department and triage nurse.  Should you have questions after your visit or need to cancel or reschedule your appointment, please contact [facility]  Dept: [phone]  and follow the prompts.  Office hours are 8:00 a.m. to 4:30 p.m. Monday - Friday. Please note that voicemails left after 4:00 p.m. may not be returned until the following business day.  We are closed weekends and major holidays. You have access to a nurse at all times for urgent questions. Please call the main number to the clinic Dept: [phone] and follow the prompts.   For any non-urgent questions, you may also contact your provider using MyChart. We now offer e-Visits for anyone 18 and older to request care online for non-urgent symptoms. For details visit [email or web address].   Also download the MyChart app! Go to the app store, search "MyChart", open the app, select [facility], and log in with your MyChart username and password.  Due to Covid, a mask is required upon entering the hospital/clinic. If you do not have a mask, one will be given to you upon arrival. For doctor visits, patients may have 1 support person aged 18 or older with them. For treatment visits, patients cannot have anyone with them due to current Covid guidelines and our immunocompromised population.   

## 2022-03-26 NOTE — Assessment & Plan Note (Signed)
She has profound weight loss and very poor appetite ?The patient has not been truthful with family members about her oral intake ?I gave her a flow sheet to document her oral intake and will reassess next week ?

## 2022-03-26 NOTE — Assessment & Plan Note (Signed)
The patient has completed radiation therapy ?She has not been feeling well since discharge from the hospital ?She have lost approximately 12 pounds of weight ?She does not know what medication she is taking ?She is in pain but she did not take her pain medicine this morning and only took oxycodone twice yesterday ?She could not remember her last bowel movement ?Overall, she has very poor health literacy and decline in performance status due to some component of noncompliance ?We will continue treatment with carboplatin today ?I gave instruction for her son to call us back when she completes her treatment to get an accurate medication list ?I will see her on a weekly basis for supportive care ?

## 2022-04-02 ENCOUNTER — Inpatient Hospital Stay: Payer: Medicare HMO

## 2022-04-02 ENCOUNTER — Other Ambulatory Visit: Payer: Self-pay | Admitting: Hematology and Oncology

## 2022-04-02 ENCOUNTER — Other Ambulatory Visit: Payer: Self-pay

## 2022-04-02 ENCOUNTER — Encounter: Payer: Self-pay | Admitting: Hematology and Oncology

## 2022-04-02 ENCOUNTER — Inpatient Hospital Stay (HOSPITAL_BASED_OUTPATIENT_CLINIC_OR_DEPARTMENT_OTHER): Payer: Medicare HMO | Admitting: Hematology and Oncology

## 2022-04-02 ENCOUNTER — Telehealth: Payer: Self-pay

## 2022-04-02 ENCOUNTER — Other Ambulatory Visit (HOSPITAL_COMMUNITY): Payer: Self-pay

## 2022-04-02 DIAGNOSIS — Z8673 Personal history of transient ischemic attack (TIA), and cerebral infarction without residual deficits: Secondary | ICD-10-CM

## 2022-04-02 DIAGNOSIS — G893 Neoplasm related pain (acute) (chronic): Secondary | ICD-10-CM | POA: Diagnosis not present

## 2022-04-02 DIAGNOSIS — R634 Abnormal weight loss: Secondary | ICD-10-CM

## 2022-04-02 DIAGNOSIS — D61818 Other pancytopenia: Secondary | ICD-10-CM

## 2022-04-02 DIAGNOSIS — C775 Secondary and unspecified malignant neoplasm of intrapelvic lymph nodes: Secondary | ICD-10-CM

## 2022-04-02 DIAGNOSIS — C541 Malignant neoplasm of endometrium: Secondary | ICD-10-CM

## 2022-04-02 DIAGNOSIS — C7802 Secondary malignant neoplasm of left lung: Secondary | ICD-10-CM

## 2022-04-02 DIAGNOSIS — Z5111 Encounter for antineoplastic chemotherapy: Secondary | ICD-10-CM | POA: Diagnosis not present

## 2022-04-02 LAB — CBC WITH DIFFERENTIAL (CANCER CENTER ONLY)
Abs Immature Granulocytes: 0.03 10*3/uL (ref 0.00–0.07)
Basophils Absolute: 0 10*3/uL (ref 0.0–0.1)
Basophils Relative: 0 %
Eosinophils Absolute: 0 10*3/uL (ref 0.0–0.5)
Eosinophils Relative: 1 %
HCT: 25.9 % — ABNORMAL LOW (ref 36.0–46.0)
Hemoglobin: 9.5 g/dL — ABNORMAL LOW (ref 12.0–15.0)
Immature Granulocytes: 1 %
Lymphocytes Relative: 7 %
Lymphs Abs: 0.3 10*3/uL — ABNORMAL LOW (ref 0.7–4.0)
MCH: 35.1 pg — ABNORMAL HIGH (ref 26.0–34.0)
MCHC: 36.7 g/dL — ABNORMAL HIGH (ref 30.0–36.0)
MCV: 95.6 fL (ref 80.0–100.0)
Monocytes Absolute: 0.4 10*3/uL (ref 0.1–1.0)
Monocytes Relative: 10 %
Neutro Abs: 3.4 10*3/uL (ref 1.7–7.7)
Neutrophils Relative %: 81 %
Platelet Count: 90 10*3/uL — ABNORMAL LOW (ref 150–400)
RBC: 2.71 MIL/uL — ABNORMAL LOW (ref 3.87–5.11)
RDW: 17.1 % — ABNORMAL HIGH (ref 11.5–15.5)
WBC Count: 4.2 10*3/uL (ref 4.0–10.5)
nRBC: 0.5 % — ABNORMAL HIGH (ref 0.0–0.2)

## 2022-04-02 LAB — MAGNESIUM: Magnesium: 1 mg/dL — ABNORMAL LOW (ref 1.7–2.4)

## 2022-04-02 LAB — SAMPLE TO BLOOD BANK

## 2022-04-02 LAB — COMPREHENSIVE METABOLIC PANEL
ALT: 23 U/L (ref 0–44)
AST: 16 U/L (ref 15–41)
Albumin: 3.6 g/dL (ref 3.5–5.0)
Alkaline Phosphatase: 77 U/L (ref 38–126)
Anion gap: 8 (ref 5–15)
BUN: 23 mg/dL (ref 8–23)
CO2: 28 mmol/L (ref 22–32)
Calcium: 8.4 mg/dL — ABNORMAL LOW (ref 8.9–10.3)
Chloride: 100 mmol/L (ref 98–111)
Creatinine, Ser: 0.88 mg/dL (ref 0.44–1.00)
GFR, Estimated: >60 mL/min (ref >60)
Glucose, Bld: 166 mg/dL — ABNORMAL HIGH (ref 70–99)
Potassium: 3.9 mmol/L (ref 3.5–5.1)
Sodium: 136 mmol/L (ref 135–145)
Total Bilirubin: 0.6 mg/dL (ref 0.3–1.2)
Total Protein: 6.8 g/dL (ref 6.5–8.1)

## 2022-04-02 MED ORDER — DULERA 100-5 MCG/ACT IN AERO
2.0000 | INHALATION_SPRAY | Freq: Two times a day (BID) | RESPIRATORY_TRACT | 1 refills | Status: AC | PRN
  Filled 2022-04-02: qty 13, 30d supply, fill #0

## 2022-04-02 MED ORDER — MAGNESIUM OXIDE -MG SUPPLEMENT 400 (240 MG) MG PO TABS
400.0000 mg | ORAL_TABLET | Freq: Every day | ORAL | 1 refills | Status: AC
  Filled 2022-04-02: qty 30, 30d supply, fill #0

## 2022-04-02 MED ORDER — SODIUM CHLORIDE 0.9% FLUSH
10.0000 mL | Freq: Once | INTRAVENOUS | Status: AC
  Administered 2022-04-02: 10 mL

## 2022-04-02 NOTE — Telephone Encounter (Signed)
Called and spoke with son. Given message from Dr. Alvy Bimler, Rx sent to [facility]. Son verbalized understanding. ? ?Son will call Dr. Melvyn Novas regarding Ruthe Mannan inhaler, insurance will not pay and the pharmacy is needing a Rx for another inhaler. ?

## 2022-04-02 NOTE — Telephone Encounter (Signed)
-----   Message from [name], MD sent at 04/02/2022  2:48 PM EDT ----- ?Mag is low ?I will send mag supplement to [facility] ? ?

## 2022-04-02 NOTE — Telephone Encounter (Signed)
Hi [name], ? ?Patient requested this ?If that cannot be filled, she needs to call Dr. Melvyn Novas for refill ?

## 2022-04-04 ENCOUNTER — Encounter: Payer: Self-pay | Admitting: Hematology and Oncology

## 2022-04-04 NOTE — Assessment & Plan Note (Signed)
I recommend an oral magnesium replacement therapy ?

## 2022-04-04 NOTE — Progress Notes (Signed)
[place] ?OFFICE PROGRESS NOTE ? ?Patient Care Team: ?[name], Veronica G, MD as PCP - General (Family Medicine) ?Awanda Mink Craige Cotta, RN as Oncology Nurse Navigator (Oncology) ? ?ASSESSMENT & PLAN:  ?Endometrial cancer ([place]) ?Since her last visit, her overall performance status has improved ?She is taking her medications correctly and is attempting to drink more fluids ?She is being supported by family members ?She brought with her documented oral intake flowsheet which revealed inadequate protein intake and she is still struggling with oral fluid hydration ?We will continue aggressive supportive care ? ?Pancytopenia, acquired ([place]) ?Her blood counts are reviewed ?She does not need transfusion support today ? ?Cancer associated pain ?She has good pain control ?We discussed importance of taking pain medicine as needed and to monitor for constipation ? 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 ?Hypomagnesemia ?I recommend an oral magnesium replacement therapy ? ?No orders of the defined types were placed in this encounter. ? ? ?All questions were answered. The patient knows to call the clinic with any problems, questions or concerns. ?The total time spent in the appointment was 30 minutes encounter with patients including review of chart and various tests results, discussions about plan of care and coordination of care plan ?  ?[name], MD ?04/04/2022 7:55 AM ? ?INTERVAL HISTORY: ?Please see below for problem oriented charting. ?she returns for treatment follow-up with multiple family members ?She brought with her documented flowsheet of oral intake ?Pain is better  controlled ?She continues to have nonproductive cough ?Her appetite is fair ?Denies recent constipation ?The patient denies any recent signs or symptoms of bleeding such as spontaneous epistaxis, hematuria or hematochezia. ? ? ?REVIEW OF SYSTEMS:   ?Constitutional: Denies fevers, chills or abnormal weight loss ?Eyes: Denies blurriness of vision ?Ears, nose, mouth, throat, and face: Denies mucositis or sore throat ?Cardiovascular: Denies palpitation, chest discomfort or lower extremity swelling ?Gastrointestinal:  Denies nausea, heartburn or change in bowel habits ?Skin: Denies abnormal skin rashes ?Lymphatics: Denies new lymphadenopathy or easy bruising ?Neurological:Denies numbness, tingling or new weaknesses ?Behavioral/Psych: Mood is stable, no new changes  ?All other systems were reviewed with the patient and are negative. ? ?I have reviewed the past medical history, past surgical history, social history and family history with the patient and they are unchanged from previous note. ? ?ALLERGIES:  is allergic to shellfish allergy and penicillins. ? ?MEDICATIONS:  ?Current Outpatient Medications  ?Medication Sig Dispense Refill  ? acetaminophen (TYLENOL) 500 MG tablet Take 500 mg by mouth every 6 (six) hours as needed for mild pain.    ? albuterol (PROAIR HFA) 108 (90 Base) MCG/ACT inhaler 2 puffs every 4 hours as needed only  if your can't catch your breath (Patient taking differently: Inhale 2 puffs into the lungs every 4 (four) hours as needed for wheezing or shortness of breath.) 18 [name] 2  ? aspirin 81 MG EC tablet Take 1 tablet (81 mg total) by mouth daily. Swallow whole. 30 tablet 2  ? atorvastatin (LIPITOR) 80 MG tablet Take 1 tablet (80 mg total) by mouth daily. 30 tablet 2  ? bisoprolol (ZEBETA) 10 MG tablet Take 1 tablet (10 mg total) by mouth  in the morning and at bedtime. 60 tablet 1  ? cetirizine (ALLERGY, CETIRIZINE,) 10 MG tablet Take 1 tablet (10 mg total) by mouth daily. (Patient taking differently:  Take 10 mg by mouth daily as needed for allergies.) 30 tablet 0  ? desonide (DESOWEN) 0.05 % lotion Apply topically 2 (two) times daily. Continue following with dermatologist (Patient taking differently: Apply 1 application. topically 2 (two) times daily as needed (skin irritation). Continue following with dermatologist) 59 mL 0  ? diphenhydrAMINE (BENADRYL) 25 MG tablet Take 25 mg by mouth every 6 (six) hours as needed for allergies.    ? ELDERBERRY PO Take 1 tablet by mouth daily.    ? famotidine (PEPCID) 20 MG tablet Take 1 tablet (20 mg total) by mouth daily for 10 days. 30 tablet 0  ? hydrochlorothiazide (HYDRODIURIL) 25 MG tablet Take 1 tablet (25 mg total) by mouth daily. 30 tablet 3  ? ketoconazole (NIZORAL) 2 % cream Apply to affected area daily as needed for irritation. 30 [name] 0  ? levothyroxine (SYNTHROID) 100 MCG tablet Take 1 tablet (100 mcg total) by mouth daily before breakfast. 30 tablet 3  ? lidocaine-prilocaine (EMLA) cream Apply 1 application topically as needed. (Patient taking differently: Apply 1 application. topically as needed (access port).) 30 [name] 3  ? loperamide (IMODIUM) 2 MG capsule Take 2 mg by mouth daily as needed for diarrhea or loose stools.    ? loratadine (CLARITIN) 10 MG tablet Take 10 mg by mouth daily as needed for allergies.    ? magnesium oxide (MAG-OX) 400 (240 Mg) MG tablet Take 1 tablet (400 mg total) by mouth daily. 30 tablet 1  ? DULERA 100-5 MCG/ACT AERO Inhale 2 puffs into the lungs 2 (two) times daily as needed for wheezing or shortness of breath. 13 [name] 1  ? ondansetron (ZOFRAN) 8 MG tablet Take 1 tablet (8 mg total) by mouth every 8 (eight) hours as needed for nausea. (Patient not taking: Reported on 03/09/2022) 30 tablet 3  ? Oxycodone HCl 10 MG TABS Take 1 tablet (10 mg total) by mouth every 4 (four) hours as needed for severe pain. 60 tablet 0  ? pantoprazole (PROTONIX) 40 MG tablet Take 1 tablet (40 mg total) by mouth daily. 30 tablet 0  ? predniSONE (DELTASONE) 20 MG  tablet Take 2 tablets (40 mg total) by mouth daily with breakfast. 60 tablet 0  ? prochlorperazine (COMPAZINE) 10 MG tablet TAKE 1 TABLET BY MOUTH EVERY 6 HOURS AS NEEDED FOR NAUSEA OR VOMITING (Patient taking differently: Take 10 mg by mouth every 6 (six) hours as needed for nausea or vomiting.) 60 tablet 3  ? triamcinolone (NASACORT) 55 MCG/ACT AERO nasal inhaler Place 2 sprays into each nostril daily. (Patient not taking: Reported on 03/09/2022) 16.9 mL 12  ? vitamin C (ASCORBIC ACID) 500 MG tablet Take 500 mg by mouth daily.    ? ?No current facility-administered medications for this visit.  ? ? ?SUMMARY OF ONCOLOGIC HISTORY: ?Oncology History Overview Note  ?ER/PR neg, MSI stable ?Progressed on [place] ?  ?Endometrial cancer ([place])  ?06/15/2018 Surgery  ? Surgeon: Donaciano Eva  ?   ?Operation: Robotic-assisted laparoscopic total hysterectomy with bilateral salpingoophorectomy, SLN biopsy  ?   ?Operative Findings:  : 6cm uterus, normal tubes and ovaries, omentum adherent to the anterior abdominal wall and uterine fundus. No suspicious nodes. ?  ?06/15/2018 Pathology Results  ? Procedure: Total hysterectomy with bilateral salpingo-oophorectomy. Bilateral obturator sentinel lymph node ?biopsies. ?  Histologic type: Endometrioid adenocarcinoma. ?Histologic Grade: FIGO grade 1. ?Myometrial invasion: ?Depth of invasion: 15 mm ?Myometrial thickness: 20 mm ?Uterine Serosa Involvement: Not identified. ?Cervical stromal involvement: Not identified. ?Extent of involvement of other organs: Uninvolved. ?Lymphovascular invasion: Present. ?Regional Lymph Nodes: ?Examined: 2 Sentinel ?0 Non-sentinel ?2 Total ?Lymph nodes with metastasis: 0 ?Isolated tumor cells (< 0.2 mm): 2 ?Micrometastasis: (> 0.2 mm and < 2.0 mm): 0 ?Macrometastasis: (> 2.0 mm): 0 ?Extracapsular extension: N/A. ?Tumor block for ancillary studies: 4Q-S. ?MMR / MSI testing: Will be ordered. ?Pathologic Stage Classification (pTNM, AJCC 8th  edition): pT1b, pN0(i+) ?  ?07/20/2018 Imaging  ? 1. Surgical changes from recent hysterectomy. No findings suspicious for residual tumor or adenopathy in the pelvis. ?2. No CT findings to suggest omental, peritoneal su

## 2022-04-04 NOTE — Assessment & Plan Note (Signed)
Her blood counts are reviewed ?She does not need transfusion support today ?

## 2022-04-04 NOTE — Assessment & Plan Note (Signed)
She had metastatic disease to the lung and has completed palliative radiation ?She has nonproductive cough but overall no signs of infection ?She takes multiple inhalers ?I attempted to refill one of her inhalers but unable to do so ?I recommend the patient to reach out to pulmonologist and to continue on incentive spirometry ?

## 2022-04-04 NOTE — Assessment & Plan Note (Signed)
Since her last visit, her overall performance status has improved ?She is taking her medications correctly and is attempting to drink more fluids ?She is being supported by family members ?She brought with her documented oral intake flowsheet which revealed inadequate protein intake and she is still struggling with oral fluid hydration ?We will continue aggressive supportive care ?

## 2022-04-04 NOTE — Assessment & Plan Note (Signed)
She has good pain control ?We discussed importance of taking pain medicine as needed and to monitor for constipation ?

## 2022-04-08 ENCOUNTER — Other Ambulatory Visit: Payer: Self-pay

## 2022-04-08 ENCOUNTER — Other Ambulatory Visit: Payer: Medicare HMO

## 2022-04-08 ENCOUNTER — Encounter: Payer: Self-pay | Admitting: Hematology and Oncology

## 2022-04-08 ENCOUNTER — Inpatient Hospital Stay (HOSPITAL_BASED_OUTPATIENT_CLINIC_OR_DEPARTMENT_OTHER): Payer: Medicare HMO | Admitting: Hematology and Oncology

## 2022-04-08 VITALS — BP 127/82 | HR 95 | Temp 99.6°F | Resp 18 | Ht 65.0 in | Wt 181.2 lb

## 2022-04-08 DIAGNOSIS — I10 Essential (primary) hypertension: Secondary | ICD-10-CM | POA: Diagnosis present

## 2022-04-08 DIAGNOSIS — K2100 Gastro-esophageal reflux disease with esophagitis, without bleeding: Secondary | ICD-10-CM | POA: Diagnosis not present

## 2022-04-08 DIAGNOSIS — D869 Sarcoidosis, unspecified: Secondary | ICD-10-CM | POA: Diagnosis not present

## 2022-04-08 DIAGNOSIS — I2510 Atherosclerotic heart disease of native coronary artery without angina pectoris: Secondary | ICD-10-CM | POA: Diagnosis not present

## 2022-04-08 DIAGNOSIS — C541 Malignant neoplasm of endometrium: Secondary | ICD-10-CM | POA: Diagnosis present

## 2022-04-08 DIAGNOSIS — R5383 Other fatigue: Secondary | ICD-10-CM | POA: Diagnosis not present

## 2022-04-08 DIAGNOSIS — E782 Mixed hyperlipidemia: Secondary | ICD-10-CM | POA: Diagnosis present

## 2022-04-08 DIAGNOSIS — Z66 Do not resuscitate: Secondary | ICD-10-CM | POA: Diagnosis not present

## 2022-04-08 DIAGNOSIS — Z6830 Body mass index (BMI) 30.0-30.9, adult: Secondary | ICD-10-CM | POA: Diagnosis not present

## 2022-04-08 DIAGNOSIS — T451X5A Adverse effect of antineoplastic and immunosuppressive drugs, initial encounter: Secondary | ICD-10-CM | POA: Diagnosis present

## 2022-04-08 DIAGNOSIS — J9601 Acute respiratory failure with hypoxia: Secondary | ICD-10-CM | POA: Diagnosis present

## 2022-04-08 DIAGNOSIS — R0609 Other forms of dyspnea: Secondary | ICD-10-CM | POA: Diagnosis not present

## 2022-04-08 DIAGNOSIS — C775 Secondary and unspecified malignant neoplasm of intrapelvic lymph nodes: Secondary | ICD-10-CM | POA: Diagnosis present

## 2022-04-08 DIAGNOSIS — E039 Hypothyroidism, unspecified: Secondary | ICD-10-CM | POA: Diagnosis present

## 2022-04-08 DIAGNOSIS — R0789 Other chest pain: Secondary | ICD-10-CM | POA: Diagnosis not present

## 2022-04-08 DIAGNOSIS — R0602 Shortness of breath: Secondary | ICD-10-CM | POA: Diagnosis not present

## 2022-04-08 DIAGNOSIS — G720 Drug-induced myopathy: Secondary | ICD-10-CM | POA: Diagnosis present

## 2022-04-08 DIAGNOSIS — R5381 Other malaise: Secondary | ICD-10-CM | POA: Diagnosis not present

## 2022-04-08 DIAGNOSIS — D61818 Other pancytopenia: Secondary | ICD-10-CM | POA: Diagnosis not present

## 2022-04-08 DIAGNOSIS — E860 Dehydration: Secondary | ICD-10-CM

## 2022-04-08 DIAGNOSIS — D61810 Antineoplastic chemotherapy induced pancytopenia: Secondary | ICD-10-CM | POA: Diagnosis present

## 2022-04-08 DIAGNOSIS — Z515 Encounter for palliative care: Secondary | ICD-10-CM | POA: Diagnosis not present

## 2022-04-08 DIAGNOSIS — R0902 Hypoxemia: Secondary | ICD-10-CM | POA: Diagnosis present

## 2022-04-08 DIAGNOSIS — J9811 Atelectasis: Secondary | ICD-10-CM | POA: Diagnosis not present

## 2022-04-08 DIAGNOSIS — D860 Sarcoidosis of lung: Secondary | ICD-10-CM | POA: Diagnosis present

## 2022-04-08 DIAGNOSIS — E43 Unspecified severe protein-calorie malnutrition: Secondary | ICD-10-CM | POA: Diagnosis present

## 2022-04-08 DIAGNOSIS — Z20822 Contact with and (suspected) exposure to covid-19: Secondary | ICD-10-CM | POA: Diagnosis present

## 2022-04-08 DIAGNOSIS — C7802 Secondary malignant neoplasm of left lung: Secondary | ICD-10-CM | POA: Diagnosis present

## 2022-04-08 DIAGNOSIS — R058 Other specified cough: Secondary | ICD-10-CM

## 2022-04-08 DIAGNOSIS — J9621 Acute and chronic respiratory failure with hypoxia: Secondary | ICD-10-CM | POA: Diagnosis not present

## 2022-04-08 DIAGNOSIS — D649 Anemia, unspecified: Secondary | ICD-10-CM | POA: Diagnosis not present

## 2022-04-08 DIAGNOSIS — Z825 Family history of asthma and other chronic lower respiratory diseases: Secondary | ICD-10-CM | POA: Diagnosis not present

## 2022-04-08 DIAGNOSIS — R530 Neoplastic (malignant) related fatigue: Secondary | ICD-10-CM | POA: Diagnosis present

## 2022-04-08 DIAGNOSIS — Z8709 Personal history of other diseases of the respiratory system: Secondary | ICD-10-CM | POA: Diagnosis not present

## 2022-04-08 DIAGNOSIS — R000 Tachycardia, unspecified: Secondary | ICD-10-CM | POA: Diagnosis not present

## 2022-04-08 DIAGNOSIS — R54 Age-related physical debility: Secondary | ICD-10-CM | POA: Diagnosis present

## 2022-04-08 DIAGNOSIS — J90 Pleural effusion, not elsewhere classified: Secondary | ICD-10-CM | POA: Diagnosis present

## 2022-04-08 DIAGNOSIS — R531 Weakness: Secondary | ICD-10-CM | POA: Diagnosis not present

## 2022-04-08 DIAGNOSIS — Z7189 Other specified counseling: Secondary | ICD-10-CM | POA: Diagnosis not present

## 2022-04-08 DIAGNOSIS — Z801 Family history of malignant neoplasm of trachea, bronchus and lung: Secondary | ICD-10-CM | POA: Diagnosis not present

## 2022-04-08 DIAGNOSIS — Z79899 Other long term (current) drug therapy: Secondary | ICD-10-CM | POA: Diagnosis not present

## 2022-04-08 DIAGNOSIS — R7989 Other specified abnormal findings of blood chemistry: Secondary | ICD-10-CM | POA: Diagnosis not present

## 2022-04-08 MED ORDER — PREDNISONE 20 MG PO TABS: 20.0000 mg | ORAL_TABLET | Freq: Every day | ORAL | 0 refills | Status: AC

## 2022-04-08 NOTE — Assessment & Plan Note (Signed)
She is not documenting oral fluid intake correctly ?I recommend increasing oral fluid hydration as tolerated ?

## 2022-04-08 NOTE — Progress Notes (Signed)
[place] ?OFFICE PROGRESS NOTE ? ?Patient Care Team: ?[name], [name], MD as PCP - General (Family Medicine) ?Awanda Mink Craige Cotta, RN as Oncology Nurse Navigator (Oncology) ? 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 ?Non-productive cough ?Overall, her cough is improving ?I recommend reducing prednisone to 20 mg daily ? ?Dehydration ?She is not documenting oral fluid intake correctly ?I recommend increasing oral fluid hydration as tolerated ? 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 ?Orders Placed This Encounter  ?Procedures  ? Ambulatory referral to Physical Therapy  ?  Referral Priority:   Routine  ?  Referral Type:   Physical Medicine  ?  Referral Reason:   Specialty Services Required  ?  Requested Specialty:   Physical Therapy  ?  Number of Visits Requested:   1  ? ? ?All questions were answered. The patient knows to call the clinic with any problems, questions or concerns. ?The total time spent in the appointment was 30 minutes encounter with patients including review of chart and various tests results, discussions about plan of care and coordination of care plan ?  ?[name], MD ?04/08/2022 5:53 PM ? ?INTERVAL HISTORY: ?Please see below for problem oriented charting. ?she returns for treatment follow-up with multiple family members ?She is sitting on the wheelchair ?Her pain is reasonably controlled ?Her cough is slightly improved ?She  brought with her documented oral intake flowsheet ?She is eating consistent meals throughout the day and moderate protein intake ?She is not drinking enough fluids ?She had regular bowel movement ?She is sedentary and does not move or exercise on a regular basis ?She had difficulties getting out of chair due to overall physical weakness ? ?REVIEW OF SYSTEMS:   ?Constitutional: Denies fevers, chills or abnormal weight loss ?Eyes: Denies blurriness of vision ?Ears, nose, mouth, throat, and face: Denies mucositis or sore throat ?Cardiovascular: Denies palpitation, chest discomfort  ?Gastrointestinal:  Denies nausea, heartburn or change in bowel habits ?Skin: Denies abnormal skin rashes ?Lymphatics: Denies new lymphadenopathy or easy bruising ?Behavioral/Psych: Mood is stable, no new changes  ?All other systems were reviewed with the patient and are negative. ? ?I have reviewed the past medical history, past surgical history, social history and family history with the patient and they are unchanged from previous note. ? ?ALLERGIES:  is allergic to shellfish allergy and penicillins. ? ?MEDICATIONS:  ?Current Outpatient Medications  ?Medication Sig Dispense Refill  ? polyethylene glycol (MIRALAX / GLYCOLAX) 17 g packet Take 17 g by mouth 2 (two) times daily.    ? senna (SENOKOT) 8.6 MG tablet Take 2 tablets by mouth 2 (two) times daily.    ? acetaminophen (TYLENOL) 500 MG tablet Take 500 mg by mouth every 6 (six) hours as needed for mild pain.    ? albuterol (PROAIR HFA) 108 (90 Base) MCG/ACT inhaler 2 puffs every 4 hours as needed only  if your can't catch your breath (Patient taking differently: Inhale 2 puffs into  the lungs every 4 (four) hours as needed for wheezing or shortness of breath.) 18 g 2  ? aspirin 81 MG EC tablet Take 1 tablet (81 mg total) by mouth daily. Swallow whole. 30 tablet 2  ? atorvastatin (LIPITOR) 80 MG tablet Take 1 tablet (80 mg total) by mouth daily. 30 tablet 2  ? bisoprolol (ZEBETA) 10 MG  tablet Take 1 tablet (10 mg total) by mouth in the morning and at bedtime. 60 tablet 1  ? cetirizine (ALLERGY, CETIRIZINE,) 10 MG tablet Take 1 tablet (10 mg total) by mouth daily. (Patient taking differently: Take 10 mg by mouth daily as needed for allergies.) 30 tablet 0  ? desonide (DESOWEN) 0.05 % lotion Apply topically 2 (two) times daily. Continue following with dermatologist (Patient taking differently: Apply 1 application. topically 2 (two) times daily as needed (skin irritation). Continue following with dermatologist) 59 mL 0  ? diphenhydrAMINE (BENADRYL) 25 MG tablet Take 25 mg by mouth every 6 (six) hours as needed for allergies.    ? ELDERBERRY PO Take 1 tablet by mouth daily.    ? famotidine (PEPCID) 20 MG tablet Take 1 tablet (20 mg total) by mouth daily for 10 days. 30 tablet 0  ? hydrochlorothiazide (HYDRODIURIL) 25 MG tablet Take 1 tablet (25 mg total) by mouth daily. 30 tablet 3  ? ketoconazole (NIZORAL) 2 % cream Apply to affected area daily as needed for irritation. 30 g 0  ? levothyroxine (SYNTHROID) 100 MCG tablet Take 1 tablet (100 mcg total) by mouth daily before breakfast. 30 tablet 3  ? lidocaine-prilocaine (EMLA) cream Apply 1 application topically as needed. (Patient taking differently: Apply 1 application. topically as needed (access port).) 30 g 3  ? loperamide (IMODIUM) 2 MG capsule Take 2 mg by mouth daily as needed for diarrhea or loose stools.    ? loratadine (CLARITIN) 10 MG tablet Take 10 mg by mouth daily as needed for allergies.    ? magnesium oxide (MAG-OX) 400 (240 Mg) MG tablet Take 1 tablet (400 mg total) by mouth daily. 30 tablet 1  ? DULERA 100-5 MCG/ACT AERO Inhale 2 puffs into the lungs 2 (two) times daily as needed for wheezing or shortness of breath. 13 g 1  ? ondansetron (ZOFRAN) 8 MG tablet Take 1 tablet (8 mg total) by mouth every 8 (eight) hours as needed for nausea. (Patient not taking: Reported on 03/09/2022) 30 tablet 3  ? Oxycodone HCl 10 MG TABS Take 1 tablet  (10 mg total) by mouth every 4 (four) hours as needed for severe pain. 60 tablet 0  ? pantoprazole (PROTONIX) 40 MG tablet Take 1 tablet (40 mg total) by mouth daily. 30 tablet 0  ? predniSONE (DELTASONE) 20 MG tablet Take 1 tablet (20 mg total) by mouth daily with breakfast. 30 tablet 0  ? prochlorperazine (COMPAZINE) 10 MG tablet TAKE 1 TABLET BY MOUTH EVERY 6 HOURS AS NEEDED FOR NAUSEA OR VOMITING (Patient taking differently: Take 10 mg by mouth every 6 (six) hours as needed for nausea or vomiting.) 60 tablet 3  ? triamcinolone (NASACORT) 55 MCG/ACT AERO nasal inhaler Place 2 sprays into each nostril daily. (Patient not taking: Reported on 03/09/2022) 16.9 mL 12  ? vitamin C (ASCORBIC ACID) 500 MG tablet Take 500 mg by mouth daily.    ? ?No current facility-administered medications for this visit.  ? ? ?SUMMARY OF ONCOLOGIC HISTORY: ?Oncology History Overview Note  ?ER/PR neg, MSI stable ?Progressed on [place] ?  ?  Endometrial cancer ([place])  ?06/15/2018 Surgery  ? Surgeon: Donaciano Eva  ?   ?Operation: Robotic-assisted laparoscopic total hysterectomy with bilateral salpingoophorectomy, SLN biopsy  ?   ?Operative Findings:  : 6cm uterus, normal tubes and ovaries, omentum adherent to the anterior abdominal wall and uterine fundus. No suspicious nodes. ?  ?06/15/2018 Pathology Results  ? Procedure: Total hysterectomy with bilateral salpingo-oophorectomy. Bilateral obturator sentinel lymph node ?biopsies. ?Histologic type: Endometrioid adenocarcinoma. ?Histologic Grade: FIGO grade 1. ?Myometrial invasion: ?Depth of invasion: 15 mm ?Myometrial thickness: 20 mm ?Uterine Serosa Involvement: Not identified. ?Cervical stromal involvement: Not identified. ?Extent of involvement of other organs: Uninvolved. ?Lymphovascular invasion: Present. ?Regional Lymph Nodes: ?Examined: 2 Sentinel ?0 Non-sentinel ?2 Total ?Lymph nodes with metastasis: 0 ?Isolated tumor cells (< 0.2 mm): 2 ?Micrometastasis: (> 0.2 mm and <  2.0 mm): 0 ?Macrometastasis: (> 2.0 mm): 0 ?Extracapsular extension: N/A. ?Tumor block for ancillary studies: 4Q-S. ?MMR / MSI testing: Will be ordered. ?Pathologic Stage Classification (pTNM, AJCC 8th ed

## 2022-04-08 NOTE — Assessment & Plan Note (Signed)
Overall, her cough is improving ?I recommend reducing prednisone to 20 mg daily ?

## 2022-04-08 NOTE — Assessment & Plan Note (Signed)
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

## 2022-04-08 NOTE — Assessment & Plan Note (Signed)
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

## 2022-04-11 ENCOUNTER — Other Ambulatory Visit: Payer: Self-pay

## 2022-04-11 ENCOUNTER — Inpatient Hospital Stay (HOSPITAL_COMMUNITY): Payer: Medicare HMO

## 2022-04-11 ENCOUNTER — Encounter (HOSPITAL_COMMUNITY): Payer: Self-pay

## 2022-04-11 ENCOUNTER — Inpatient Hospital Stay (HOSPITAL_COMMUNITY)
Admission: EM | Admit: 2022-04-11 | Discharge: 2022-04-15 | DRG: 189 | Disposition: A | Discharge status: Hospice | Payer: Medicare HMO | Attending: Student | Admitting: Student

## 2022-04-11 ENCOUNTER — Emergency Department (HOSPITAL_COMMUNITY): Payer: Medicare HMO

## 2022-04-11 ENCOUNTER — Telehealth: Payer: Self-pay

## 2022-04-11 DIAGNOSIS — R531 Weakness: Secondary | ICD-10-CM | POA: Diagnosis not present

## 2022-04-11 DIAGNOSIS — Z79899 Other long term (current) drug therapy: Secondary | ICD-10-CM

## 2022-04-11 DIAGNOSIS — C541 Malignant neoplasm of endometrium: Secondary | ICD-10-CM | POA: Diagnosis present

## 2022-04-11 DIAGNOSIS — C7802 Secondary malignant neoplasm of left lung: Secondary | ICD-10-CM | POA: Diagnosis present

## 2022-04-11 DIAGNOSIS — G720 Drug-induced myopathy: Secondary | ICD-10-CM | POA: Diagnosis present

## 2022-04-11 DIAGNOSIS — Z79890 Hormone replacement therapy (postmenopausal): Secondary | ICD-10-CM

## 2022-04-11 DIAGNOSIS — E782 Mixed hyperlipidemia: Secondary | ICD-10-CM | POA: Diagnosis present

## 2022-04-11 DIAGNOSIS — R0602 Shortness of breath: Secondary | ICD-10-CM | POA: Insufficient documentation

## 2022-04-11 DIAGNOSIS — R0609 Other forms of dyspnea: Secondary | ICD-10-CM | POA: Diagnosis not present

## 2022-04-11 DIAGNOSIS — E43 Unspecified severe protein-calorie malnutrition: Secondary | ICD-10-CM | POA: Diagnosis present

## 2022-04-11 DIAGNOSIS — R54 Age-related physical debility: Secondary | ICD-10-CM | POA: Diagnosis present

## 2022-04-11 DIAGNOSIS — J90 Pleural effusion, not elsewhere classified: Secondary | ICD-10-CM | POA: Diagnosis present

## 2022-04-11 DIAGNOSIS — K2100 Gastro-esophageal reflux disease with esophagitis, without bleeding: Secondary | ICD-10-CM | POA: Diagnosis not present

## 2022-04-11 DIAGNOSIS — Z66 Do not resuscitate: Secondary | ICD-10-CM | POA: Diagnosis not present

## 2022-04-11 DIAGNOSIS — Z20822 Contact with and (suspected) exposure to covid-19: Secondary | ICD-10-CM | POA: Diagnosis present

## 2022-04-11 DIAGNOSIS — E039 Hypothyroidism, unspecified: Secondary | ICD-10-CM | POA: Diagnosis present

## 2022-04-11 DIAGNOSIS — Z923 Personal history of irradiation: Secondary | ICD-10-CM

## 2022-04-11 DIAGNOSIS — Z87730 Personal history of (corrected) cleft lip and palate: Secondary | ICD-10-CM

## 2022-04-11 DIAGNOSIS — Z801 Family history of malignant neoplasm of trachea, bronchus and lung: Secondary | ICD-10-CM

## 2022-04-11 DIAGNOSIS — Z7189 Other specified counseling: Secondary | ICD-10-CM | POA: Diagnosis not present

## 2022-04-11 DIAGNOSIS — D860 Sarcoidosis of lung: Secondary | ICD-10-CM | POA: Diagnosis present

## 2022-04-11 DIAGNOSIS — Z87412 Personal history of vulvar dysplasia: Secondary | ICD-10-CM

## 2022-04-11 DIAGNOSIS — R7989 Other specified abnormal findings of blood chemistry: Secondary | ICD-10-CM

## 2022-04-11 DIAGNOSIS — R530 Neoplastic (malignant) related fatigue: Secondary | ICD-10-CM | POA: Diagnosis present

## 2022-04-11 DIAGNOSIS — Z87410 Personal history of cervical dysplasia: Secondary | ICD-10-CM

## 2022-04-11 DIAGNOSIS — J9621 Acute and chronic respiratory failure with hypoxia: Secondary | ICD-10-CM | POA: Diagnosis not present

## 2022-04-11 DIAGNOSIS — R0902 Hypoxemia: Secondary | ICD-10-CM | POA: Diagnosis present

## 2022-04-11 DIAGNOSIS — K219 Gastro-esophageal reflux disease without esophagitis: Secondary | ICD-10-CM | POA: Diagnosis present

## 2022-04-11 DIAGNOSIS — C775 Secondary and unspecified malignant neoplasm of intrapelvic lymph nodes: Secondary | ICD-10-CM | POA: Diagnosis present

## 2022-04-11 DIAGNOSIS — Z7982 Long term (current) use of aspirin: Secondary | ICD-10-CM

## 2022-04-11 DIAGNOSIS — D61810 Antineoplastic chemotherapy induced pancytopenia: Secondary | ICD-10-CM | POA: Diagnosis present

## 2022-04-11 DIAGNOSIS — I10 Essential (primary) hypertension: Secondary | ICD-10-CM | POA: Diagnosis present

## 2022-04-11 DIAGNOSIS — R0789 Other chest pain: Secondary | ICD-10-CM | POA: Diagnosis not present

## 2022-04-11 DIAGNOSIS — Z880 Allergy status to penicillin: Secondary | ICD-10-CM

## 2022-04-11 DIAGNOSIS — T451X5A Adverse effect of antineoplastic and immunosuppressive drugs, initial encounter: Secondary | ICD-10-CM | POA: Diagnosis present

## 2022-04-11 DIAGNOSIS — R739 Hyperglycemia, unspecified: Secondary | ICD-10-CM | POA: Diagnosis present

## 2022-04-11 DIAGNOSIS — D61818 Other pancytopenia: Secondary | ICD-10-CM

## 2022-04-11 DIAGNOSIS — Z90710 Acquired absence of both cervix and uterus: Secondary | ICD-10-CM

## 2022-04-11 DIAGNOSIS — Z825 Family history of asthma and other chronic lower respiratory diseases: Secondary | ICD-10-CM

## 2022-04-11 DIAGNOSIS — Z87891 Personal history of nicotine dependence: Secondary | ICD-10-CM

## 2022-04-11 DIAGNOSIS — Z7952 Long term (current) use of systemic steroids: Secondary | ICD-10-CM

## 2022-04-11 DIAGNOSIS — T380X5A Adverse effect of glucocorticoids and synthetic analogues, initial encounter: Secondary | ICD-10-CM | POA: Diagnosis present

## 2022-04-11 DIAGNOSIS — D649 Anemia, unspecified: Secondary | ICD-10-CM

## 2022-04-11 DIAGNOSIS — J9601 Acute respiratory failure with hypoxia: Secondary | ICD-10-CM | POA: Diagnosis present

## 2022-04-11 DIAGNOSIS — Z515 Encounter for palliative care: Secondary | ICD-10-CM | POA: Diagnosis not present

## 2022-04-11 DIAGNOSIS — D869 Sarcoidosis, unspecified: Secondary | ICD-10-CM | POA: Diagnosis present

## 2022-04-11 DIAGNOSIS — Z6830 Body mass index (BMI) 30.0-30.9, adult: Secondary | ICD-10-CM | POA: Diagnosis not present

## 2022-04-11 DIAGNOSIS — Z91013 Allergy to seafood: Secondary | ICD-10-CM

## 2022-04-11 LAB — BLOOD GAS, ARTERIAL
Acid-Base Excess: 3.5 mmol/L — ABNORMAL HIGH (ref 0.0–2.0)
Bicarbonate: 27.8 mmol/L (ref 20.0–28.0)
O2 Saturation: 94.5 %
Patient temperature: 37
pCO2 arterial: 40 mmHg (ref 32–48)
pH, Arterial: 7.45 (ref 7.35–7.45)
pO2, Arterial: 76 mmHg — ABNORMAL LOW (ref 83–108)

## 2022-04-11 LAB — IRON AND TIBC
Iron: 60 ug/dL (ref 28–170)
Saturation Ratios: 25 % (ref 10.4–31.8)
TIBC: 242 ug/dL — ABNORMAL LOW (ref 250–450)
UIBC: 182 ug/dL

## 2022-04-11 LAB — CBC
HCT: 22 % — ABNORMAL LOW (ref 36.0–46.0)
Hemoglobin: 7.8 g/dL — ABNORMAL LOW (ref 12.0–15.0)
MCH: 35.1 pg — ABNORMAL HIGH (ref 26.0–34.0)
MCHC: 35.5 g/dL (ref 30.0–36.0)
MCV: 99.1 fL (ref 80.0–100.0)
Platelets: 73 10*3/uL — ABNORMAL LOW (ref 150–400)
RBC: 2.22 MIL/uL — ABNORMAL LOW (ref 3.87–5.11)
RDW: 20.7 % — ABNORMAL HIGH (ref 11.5–15.5)
WBC: 2.6 10*3/uL — ABNORMAL LOW (ref 4.0–10.5)
nRBC: 2.7 % — ABNORMAL HIGH (ref 0.0–0.2)

## 2022-04-11 LAB — CBG MONITORING, ED: Glucose-Capillary: 135 mg/dL — ABNORMAL HIGH (ref 70–99)

## 2022-04-11 LAB — BASIC METABOLIC PANEL
Anion gap: 9 (ref 5–15)
BUN: 20 mg/dL (ref 8–23)
CO2: 27 mmol/L (ref 22–32)
Calcium: 8.4 mg/dL — ABNORMAL LOW (ref 8.9–10.3)
Chloride: 103 mmol/L (ref 98–111)
Creatinine, Ser: 0.83 mg/dL (ref 0.44–1.00)
GFR, Estimated: >60 mL/min (ref >60)
Glucose, Bld: 126 mg/dL — ABNORMAL HIGH (ref 70–99)
Potassium: 3.5 mmol/L (ref 3.5–5.1)
Sodium: 139 mmol/L (ref 135–145)

## 2022-04-11 LAB — RESP PANEL BY RT-PCR (FLU A&B, COVID) ARPGX2
Influenza A by PCR: NEGATIVE
Influenza B by PCR: NEGATIVE
SARS Coronavirus 2 by RT PCR: NEGATIVE

## 2022-04-11 LAB — TROPONIN I (HIGH SENSITIVITY)
Troponin I (High Sensitivity): 12 ng/L (ref <18)
Troponin I (High Sensitivity): 17 ng/L (ref <18)

## 2022-04-11 LAB — T4, FREE: Free T4: 1.34 ng/dL — ABNORMAL HIGH (ref 0.61–1.12)

## 2022-04-11 LAB — BRAIN NATRIURETIC PEPTIDE: B Natriuretic Peptide: 69.3 pg/mL (ref 0.0–100.0)

## 2022-04-11 LAB — TSH: TSH: 1.544 u[IU]/mL (ref 0.350–4.500)

## 2022-04-11 LAB — HEPATIC FUNCTION PANEL
ALT: 21 U/L (ref 0–44)
AST: 19 U/L (ref 15–41)
Albumin: 3.4 g/dL — ABNORMAL LOW (ref 3.5–5.0)
Alkaline Phosphatase: 87 U/L (ref 38–126)
Bilirubin, Direct: 0.3 mg/dL — ABNORMAL HIGH (ref 0.0–0.2)
Indirect Bilirubin: 1 mg/dL — ABNORMAL HIGH (ref 0.3–0.9)
Total Bilirubin: 1.3 mg/dL — ABNORMAL HIGH (ref 0.3–1.2)
Total Protein: 7 g/dL (ref 6.5–8.1)

## 2022-04-11 LAB — POC OCCULT BLOOD, ED: Fecal Occult Bld: NEGATIVE

## 2022-04-11 MED ORDER — CHLORHEXIDINE GLUCONATE CLOTH 2 % EX PADS
6.0000 | MEDICATED_PAD | Freq: Every day | CUTANEOUS | Status: DC
  Administered 2022-04-11 – 2022-04-15 (×5): 6 via TOPICAL

## 2022-04-11 MED ORDER — MAGNESIUM OXIDE -MG SUPPLEMENT 400 (240 MG) MG PO TABS
400.0000 mg | ORAL_TABLET | Freq: Every morning | ORAL | Status: DC
  Administered 2022-04-12: 400 mg via ORAL
  Filled 2022-04-11: qty 1

## 2022-04-11 MED ORDER — LORATADINE 10 MG PO TABS
10.0000 mg | ORAL_TABLET | Freq: Every day | ORAL | Status: DC
Start: 2022-04-12 — End: 2022-04-15
  Administered 2022-04-12 – 2022-04-15 (×4): 10 mg via ORAL
  Filled 2022-04-11 (×4): qty 1

## 2022-04-11 MED ORDER — SODIUM CHLORIDE 0.9% FLUSH: 10.0000 mL | INTRAVENOUS | Status: DC | PRN

## 2022-04-11 MED ORDER — METHYLPREDNISOLONE SODIUM SUCC 125 MG IJ SOLR
125.0000 mg | Freq: Once | INTRAMUSCULAR | Status: AC
  Administered 2022-04-11: 125 mg via INTRAVENOUS
  Filled 2022-04-11: qty 2

## 2022-04-11 MED ORDER — ALBUTEROL SULFATE (2.5 MG/3ML) 0.083% IN NEBU: 2.5000 mg | INHALATION_SOLUTION | RESPIRATORY_TRACT | Status: DC | PRN

## 2022-04-11 MED ORDER — POLYETHYLENE GLYCOL 3350 17 G PO PACK
17.0000 g | PACK | Freq: Two times a day (BID) | ORAL | Status: DC
  Administered 2022-04-12 – 2022-04-15 (×7): 17 g via ORAL
  Filled 2022-04-11 (×8): qty 1

## 2022-04-11 MED ORDER — OXYCODONE-ACETAMINOPHEN 7.5-325 MG PO TABS
1.0000 | ORAL_TABLET | Freq: Four times a day (QID) | ORAL | Status: DC | PRN
  Administered 2022-04-11: 1 via ORAL
  Filled 2022-04-11: qty 1

## 2022-04-11 MED ORDER — ACETAMINOPHEN 650 MG RE SUPP: 650.0000 mg | Freq: Four times a day (QID) | RECTAL | Status: DC | PRN

## 2022-04-11 MED ORDER — IPRATROPIUM-ALBUTEROL 0.5-2.5 (3) MG/3ML IN SOLN
3.0000 mL | Freq: Once | RESPIRATORY_TRACT | Status: AC
  Administered 2022-04-11: 3 mL via RESPIRATORY_TRACT
  Filled 2022-04-11: qty 3

## 2022-04-11 MED ORDER — ENSURE ENLIVE PO LIQD
237.0000 mL | Freq: Two times a day (BID) | ORAL | Status: DC
  Administered 2022-04-12 – 2022-04-15 (×5): 237 mL via ORAL

## 2022-04-11 MED ORDER — SENNOSIDES-DOCUSATE SODIUM 8.6-50 MG PO TABS
1.0000 | ORAL_TABLET | Freq: Two times a day (BID) | ORAL | Status: DC
  Administered 2022-04-12 – 2022-04-15 (×6): 1 via ORAL
  Filled 2022-04-11 (×8): qty 1

## 2022-04-11 MED ORDER — MOMETASONE FURO-FORMOTEROL FUM 100-5 MCG/ACT IN AERO
2.0000 | INHALATION_SPRAY | Freq: Two times a day (BID) | RESPIRATORY_TRACT | Status: DC
  Administered 2022-04-12 – 2022-04-14 (×5): 2 via RESPIRATORY_TRACT
  Filled 2022-04-11: qty 8.8

## 2022-04-11 MED ORDER — BISOPROLOL FUMARATE 5 MG PO TABS: 10.0000 mg | ORAL_TABLET | Freq: Two times a day (BID) | ORAL | Status: DC

## 2022-04-11 MED ORDER — IPRATROPIUM-ALBUTEROL 0.5-2.5 (3) MG/3ML IN SOLN
3.0000 mL | Freq: Four times a day (QID) | RESPIRATORY_TRACT | Status: DC
  Administered 2022-04-11: 3 mL via RESPIRATORY_TRACT
  Filled 2022-04-11: qty 3

## 2022-04-11 MED ORDER — SODIUM CHLORIDE 0.9 % IV BOLUS: 1000.0000 mL | Freq: Once | INTRAVENOUS | Status: DC

## 2022-04-11 MED ORDER — LEVOTHYROXINE SODIUM 100 MCG PO TABS
100.0000 ug | ORAL_TABLET | Freq: Every day | ORAL | Status: DC
  Administered 2022-04-12 – 2022-04-15 (×4): 100 ug via ORAL
  Filled 2022-04-11 (×4): qty 1

## 2022-04-11 MED ORDER — BISOPROLOL FUMARATE 5 MG PO TABS
10.0000 mg | ORAL_TABLET | Freq: Every day | ORAL | Status: DC
Start: 2022-04-11 — End: 2022-04-15
  Administered 2022-04-11 – 2022-04-15 (×5): 10 mg via ORAL
  Filled 2022-04-11 (×5): qty 2

## 2022-04-11 MED ORDER — PREDNISONE 20 MG PO TABS
20.0000 mg | ORAL_TABLET | Freq: Every day | ORAL | Status: DC
Start: 2022-04-11 — End: 2022-04-15
  Administered 2022-04-12 – 2022-04-15 (×4): 20 mg via ORAL
  Filled 2022-04-11 (×5): qty 1

## 2022-04-11 MED ORDER — ACETAMINOPHEN 325 MG PO TABS
650.0000 mg | ORAL_TABLET | Freq: Four times a day (QID) | ORAL | Status: DC | PRN
  Administered 2022-04-13: 650 mg via ORAL
  Filled 2022-04-11: qty 2

## 2022-04-11 MED ORDER — GUAIFENESIN 100 MG/5ML PO LIQD
5.0000 mL | ORAL | Status: DC | PRN
  Administered 2022-04-13: 5 mL via ORAL
  Filled 2022-04-11: qty 10

## 2022-04-11 MED ORDER — OXYCODONE HCL 5 MG PO TABS
10.0000 mg | ORAL_TABLET | ORAL | Status: DC | PRN
  Administered 2022-04-12: 10 mg via ORAL
  Filled 2022-04-11: qty 2

## 2022-04-11 MED ORDER — IPRATROPIUM-ALBUTEROL 0.5-2.5 (3) MG/3ML IN SOLN
3.0000 mL | Freq: Three times a day (TID) | RESPIRATORY_TRACT | Status: DC
  Administered 2022-04-12 – 2022-04-15 (×10): 3 mL via RESPIRATORY_TRACT
  Filled 2022-04-11 (×10): qty 3

## 2022-04-11 MED ORDER — IOHEXOL 350 MG/ML SOLN
75.0000 mL | Freq: Once | INTRAVENOUS | Status: AC | PRN
  Administered 2022-04-11: 75 mL via INTRAVENOUS

## 2022-04-11 MED ORDER — ENOXAPARIN SODIUM 40 MG/0.4ML IJ SOSY: 40.0000 mg | PREFILLED_SYRINGE | INTRAMUSCULAR | Status: DC

## 2022-04-11 MED ORDER — ATORVASTATIN CALCIUM 40 MG PO TABS
80.0000 mg | ORAL_TABLET | Freq: Every morning | ORAL | Status: DC
  Administered 2022-04-12: 80 mg via ORAL
  Filled 2022-04-11: qty 2

## 2022-04-11 MED ORDER — PANTOPRAZOLE SODIUM 40 MG PO TBEC
40.0000 mg | DELAYED_RELEASE_TABLET | Freq: Every day | ORAL | Status: DC
Start: 2022-04-11 — End: 2022-04-14
  Administered 2022-04-11 – 2022-04-13 (×3): 40 mg via ORAL
  Filled 2022-04-11 (×3): qty 1

## 2022-04-11 MED ORDER — SODIUM CHLORIDE 0.9% FLUSH
10.0000 mL | Freq: Two times a day (BID) | INTRAVENOUS | Status: DC
  Administered 2022-04-11: 10 mL
  Administered 2022-04-13: 40 mL
  Administered 2022-04-13: 10 mL

## 2022-04-11 NOTE — Telephone Encounter (Signed)
Daughter called and left a message that her Mom is going via EMS to [facility]. ?

## 2022-04-11 NOTE — H&P (Signed)
?History and Physical  ? ? ?Patient: [name] HYW:[id number] DOB: April 03, 1956 ?DOA: 04/11/2022 ?DOS: the patient was seen and examined on 04/11/2022 ?PCP: [name], [name], MD  ?Patient coming from: Home ? ?Chief Complaint:  ?Chief Complaint  ?Patient presents with  ? Weakness  ? Fatigue  ? ?HPI: [name] is a 66 y.o. female with medical history significant of endometrial cancer, HTN, pulmonary sarcoidosis, HLD, GERD. Presenting with dyspnea and weakness. She states that her symptoms started yesterday while she was walking in the hall. She noticed that she couldn't catch her breath. There was no chest pain. She sat down, took her inhaler and rested. This seemed to help. However, her shortness of breath returned last night and again this morning. It is no longer responsive to her inhalers. When her symptoms did not improve this morning, she decided to come to the ED for help. Additionally, she reports increasing weakness over the last weak or so. It's generalized weakness. She feels like it is worsen than her previous bouts of weakness after chemo. She otherwise denies any other aggravating or alleviating factors.  ? ?Review of Systems: As mentioned in the history of present illness. All other systems reviewed and are negative. ?Past Medical History:  ?Diagnosis Date  ? Bifid uvula   ? Blood transfusion without reported diagnosis   ? Endometrial ca [facility]) dx'd 06/2018  ? chemo  ? GERD (gastroesophageal reflux disease)   ? History of cervical dysplasia   ? History of chemotherapy   ? 06/ 2005  ? History of cleft lip   ? CORRECTED  ? History of condyloma acuminatum   ? PERIANAL  ? History of radiation therapy   ? completed 06/ 2005  ? History of vulvar dysplasia   ? Hypertension   ? 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 Pulmonary nodule, left   ? Pulmonary sarcoidosis ([place]) since 1996  ? PULMOLOGIST-  DR Melvyn Novas--  CHRONIC PREDNISONE  ? Sarcoid uveitis of left eye goes to [facility]  ? intermittantly--   currently no issues per pt 11-27-2016  ? 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 S/P  local resection 03/2004.  ChemoXRT 2005 Dr Collier Salina Rubin/[facility]  ? Upper airway cough syndrome   ? Wears glasses   ? ?Past Surgical History:  ?Procedure Laterality Date  ? BRONCHOSCOPY  01/22/2008  ? w/ Lavage and bronchial bx  ? CARDIOVASCULAR STRESS TEST  06/27/2006  ? normal nuclear study w/ no ischemia/  normal LV function and wall motion , ef 61%  ? CARPAL TUNNEL RELEASE Right 01/17/2016  ? and finger trigger release  ? CLEFT LIP REPAIR    ? CO2 LASER ABLATION VULVA    ? EUA/  EXCISIONAL MULTIPLE PERINEAL/ANAL BX'S/  SIGMOIDSCOPY/  PORT-A-CATH PLACEMENT  03/27/2004  ? Local excision - Dr Lennie Hummer  ? IR IMAGING GUIDED PORT INSERTION  05/26/2020  ? LAPAROSCOPIC CHOLECYSTECTOMY  08/14/2008  ? MULTIPLE ANAL BX'S  10/10/2006  ? RELEASE A1 PULLEY THUMB Bilateral left 12-17-2006/  right 09-05-2009  ? REMOVAL PORT-A-CATH/  EUA/  MULTIPLE ANAL BX'S  10/16/2004  ? ROBOTIC ASSISTED TOTAL HYSTERECTOMY WITH BILATERAL SALPINGO OOPHERECTOMY Bilateral 06/25/2018  ? Procedure: XI ROBOTIC ASSISTED TOTAL HYSTERECTOMY WITH BILATERAL SALPINGO OOPHORECTOMY;  Surgeon: Everitt Amber, MD;  Location: [facility];  Service: Gynecology;  Laterality: Bilateral;  ? SENTINEL NODE BIOPSY N/A 06/25/2018  ? Procedure: SENTINEL NODE BIOPSY;  Surgeon: Everitt Amber, MD;  Location: [facility];  Service: Gynecology;  Laterality: N/A;  ? TENNIS ELBOW RELEASE/NIRSCHEL PROCEDURE Right 11/28/2016  ? Procedure: RIGHT ELBOW ULNER NERVE RELEASE AND OR TRANSPOSITION;  Surgeon: Iran Planas, MD;  Location: [place];  Service: Orthopedics;  Laterality: Right;  ? ?Social History:  reports that she quit smoking about 28 years ago. Her smoking use included cigarettes. She has a 0.50 pack-year smoking history. She has never used smokeless tobacco. She reports that she does not currently use alcohol. She reports that she does not use drugs. ? ?Allergies  ?Allergen Reactions   ? Shellfish Allergy Hives, Itching and Swelling  ? Penicillins Hives and Itching  ?  Has patient had a PCN reaction causing immediate rash, facial/tongue/throat swelling, SOB or lightheadedness with hypotension: Yes ?Has patient had a PCN reaction causing severe rash involving mucus membranes or skin necrosis: Yes ?Has patient had a PCN reaction that required hospitalization: Yes ?Has patient had a PCN reaction occurring within the last 10 years: No ?If all of the above answers are "NO", then may proceed with Cephalosporin use. ?  ? ? ?Family History  ?Problem Relation Age of Onset  ? Allergies Mother   ? Asthma Mother   ? Lung cancer Mother   ? Allergies Sister   ? Allergies Brother   ? Asthma Brother   ? Asthma Sister   ? Sarcoidosis Neg Hx   ? Colon cancer Neg Hx   ? Liver cancer Neg Hx   ? Stomach cancer Neg Hx   ? Esophageal cancer Neg Hx   ? Rectal cancer Neg Hx   ? ? ?Prior to Admission medications   ?Medication Sig Start Date End Date Taking? Authorizing Provider  ?albuterol (PROAIR HFA) 108 (90 Base) MCG/ACT inhaler 2 puffs every 4 hours as needed only  if your can't catch your breath ?Patient taking differently: Inhale 2 puffs into the lungs every 4 (four) hours as needed for wheezing or shortness of breath. 10/31/21  Yes Tanda Rockers, MD  ?aspirin 81 MG EC tablet Take 1 tablet (81 mg total) by mouth daily. Swallow whole. ?Patient taking differently: Take 81 mg by mouth every morning. Swallow whole. 02/19/22 05/20/22 Yes Barb Merino, MD  ?atorvastatin (LIPITOR) 80 MG tablet Take 1 tablet (80 mg total) by mouth daily. ?Patient taking differently: Take 80 mg by mouth every morning. 02/19/22 05/20/22 Yes Barb Merino, MD  ?bisoprolol (ZEBETA) 10 MG tablet Take 1 tablet (10 mg total) by mouth in the morning and at bedtime. 12/12/21  Yes [name], MD  ?cetirizine (ALLERGY, CETIRIZINE,) 10 MG tablet Take 1 tablet (10 mg total) by mouth daily. ?Patient taking differently: Take 10 mg by mouth daily as needed  for allergies. 02/15/22 04/11/22 Yes Leath-Warren, Alda Lea, NP  ?desonide (DESOWEN) 0.05 % lotion Apply topically 2 (two) times daily. Continue following with dermatologist ?Patient taking differently: Apply 1 application. topically 2 (two) times daily as needed (skin irritation). Continue following with dermatologist 09/29/19  Yes [name], [name], MD  ?diphenhydrAMINE (BENADRYL) 25 MG tablet Take 25 mg by mouth at bedtime as needed for allergies.   Yes [provider]  ?DULERA 100-5 MCG/ACT AERO Inhale 2 puffs into the lungs 2 (two) times daily as needed for wheezing or shortness of breath. 04/02/22  Yes Gorsuch, Ni, MD  ?ELDERBERRY PO Take 1 tablet by mouth every morning.   Yes [provider]  ?ketoconazole (NIZORAL) 2 % cream Apply to affected area daily as needed for irritation. 02/27/22  Yes [name], [name], MD  ?  levothyroxine (SYNTHROID) 100 MCG tablet Take 1 tablet (100 mcg total) by mouth daily before breakfast. 12/21/21  Yes Gorsuch, Ni, MD  ?lidocaine-prilocaine (EMLA) cream Apply 1 application topically as needed. ?Patient taking differently: Apply 1 application. topically as needed (prior to port access). 09/25/21  Yes Gorsuch, Ernst Spell, MD  ?loratadine (CLARITIN) 10 MG tablet Take 10 mg by mouth daily as needed for allergies.   Yes [provider]  ?magnesium oxide (MAG-OX) 400 (240 Mg) MG tablet Take 1 tablet (400 mg total) by mouth daily. ?Patient taking differently: Take 400 mg by mouth every morning. 04/02/22  Yes [name], MD  ?Multiple Vitamins-Minerals (ADULT ONE DAILY GUMMIES) CHEW Chew 1 tablet by mouth every morning.   Yes [provider]  ?Oxycodone HCl 10 MG TABS Take 1 tablet (10 mg total) by mouth every 4 (four) hours as needed for severe pain. 03/05/22  Yes Gorsuch, Ni, MD  ?pantoprazole (PROTONIX) 40 MG tablet Take 1 tablet (40 mg total) by mouth daily. ?Patient taking differently: Take 40 mg by mouth at bedtime. 03/12/22 04/11/22 Yes Antonieta Pert, MD   ?polyethylene glycol (MIRALAX / GLYCOLAX) 17 g packet Take 17 g by mouth 2 (two) times daily.   Yes [provider]  ?predniSONE (DELTASONE) 20 MG tablet Take 1 tablet (20 mg total) by mouth daily with b

## 2022-04-11 NOTE — ED Notes (Signed)
Patient to thoracentesis procedure. ?

## 2022-04-11 NOTE — Progress Notes (Signed)
Patient ID: [name], female   DOB: Mar 06, 1956, 66 y.o.   MRN: [id number] ?Pt presented to [place] dept today for right thoracentesis. On limited US right posterior chest there is only a trace effusion noted, not enough to safely perform thoracentesis. Images were also reviewed by Dr. Earleen Newport. Procedure cancelled. Pt updated.  ?

## 2022-04-11 NOTE — ED Triage Notes (Signed)
Pt coming from home via EMS. Pt stated that when she woke up this morning she had increased generalized weakness and malaise. Pt has endometrial cancer. A&Ox4. Pt has non-productive cough. ?

## 2022-04-11 NOTE — ED Provider Notes (Signed)
?[place] DEPT ?Provider Note ? ? ?CSN: [id number] ?Arrival date & time: 04/11/22  1057 ? ?  ? ?History ? ?Chief Complaint  ?Patient presents with  ? Weakness  ? Fatigue  ? ? ?[name] is a 66 y.o. female. ? ?Patient with history of endometrial cancer status postradiation and chemotherapy now with possible spread to the lungs.  Believe that has been recurrence and now she is on chemotherapy and has had radiation treatments again.  Recently was admitted for pleural effusion.  She also has a history of sarcoidosis, reflux.  She has had increased weakness, shortness of breath, cough but no sputum production.  No major swelling in her legs.  Denies any fevers or chills.  But has had this progressive weakness and shortness of breath on exertion.  She had thoracentesis several weeks ago.  She had a CT scan done at that time and concerned that she might have pulmonary mets but could also be related to her sarcoid. ? ? ?Weakness ? ?  ? ?Home Medications ?Prior to Admission medications   ?Medication Sig Start Date End Date Taking? Authorizing Provider  ?albuterol (PROAIR HFA) 108 (90 Base) MCG/ACT inhaler 2 puffs every 4 hours as needed only  if your can't catch your breath ?Patient taking differently: Inhale 2 puffs into the lungs every 4 (four) hours as needed for wheezing or shortness of breath. 10/31/21  Yes Tanda Rockers, MD  ?aspirin 81 MG EC tablet Take 1 tablet (81 mg total) by mouth daily. Swallow whole. ?Patient taking differently: Take 81 mg by mouth every morning. Swallow whole. 02/19/22 05/20/22 Yes Barb Merino, MD  ?atorvastatin (LIPITOR) 80 MG tablet Take 1 tablet (80 mg total) by mouth daily. ?Patient taking differently: Take 80 mg by mouth every morning. 02/19/22 05/20/22 Yes Barb Merino, MD  ?bisoprolol (ZEBETA) 10 MG tablet Take 1 tablet (10 mg total) by mouth in the morning and at bedtime. 12/12/21  Yes [name], MD  ?cetirizine (ALLERGY, CETIRIZINE,) 10 MG tablet  Take 1 tablet (10 mg total) by mouth daily. ?Patient taking differently: Take 10 mg by mouth daily as needed for allergies. 02/15/22 04/11/22 Yes Leath-Warren, Alda Lea, NP  ?desonide (DESOWEN) 0.05 % lotion Apply topically 2 (two) times daily. Continue following with dermatologist ?Patient taking differently: Apply 1 application. topically 2 (two) times daily as needed (skin irritation). Continue following with dermatologist 09/29/19  Yes [name], [name], MD  ?diphenhydrAMINE (BENADRYL) 25 MG tablet Take 25 mg by mouth at bedtime as needed for allergies.   Yes [provider]  ?DULERA 100-5 MCG/ACT AERO Inhale 2 puffs into the lungs 2 (two) times daily as needed for wheezing or shortness of breath. 04/02/22  Yes Gorsuch, Ni, MD  ?ELDERBERRY PO Take 1 tablet by mouth every morning.   Yes [provider]  ?ketoconazole (NIZORAL) 2 % cream Apply to affected area daily as needed for irritation. 02/27/22  Yes [name], [name], MD  ?levothyroxine (SYNTHROID) 100 MCG tablet Take 1 tablet (100 mcg total) by mouth daily before breakfast. 12/21/21  Yes Alvy Bimler, Ni, MD  ?lidocaine-prilocaine (EMLA) cream Apply 1 application topically as needed. ?Patient taking differently: Apply 1 application. topically as needed (prior to port access). 09/25/21  Yes Gorsuch, Ernst Spell, MD  ?loratadine (CLARITIN) 10 MG tablet Take 10 mg by mouth daily as needed for allergies.   Yes [provider]  ?magnesium oxide (MAG-OX) 400 (240 Mg) MG tablet Take 1 tablet (400 mg total) by mouth daily. ?  Patient taking differently: Take 400 mg by mouth every morning. 04/02/22  Yes [name], MD  ?Multiple Vitamins-Minerals (ADULT ONE DAILY GUMMIES) CHEW Chew 1 tablet by mouth every morning.   Yes [provider]  ?Oxycodone HCl 10 MG TABS Take 1 tablet (10 mg total) by mouth every 4 (four) hours as needed for severe pain. 03/05/22  Yes Gorsuch, Ni, MD  ?pantoprazole (PROTONIX) 40 MG tablet Take 1 tablet (40 mg total) by mouth  daily. ?Patient taking differently: Take 40 mg by mouth at bedtime. 03/12/22 04/11/22 Yes Antonieta Pert, MD  ?polyethylene glycol (MIRALAX / GLYCOLAX) 17 g packet Take 17 g by mouth 2 (two) times daily.   Yes [provider]  ?predniSONE (DELTASONE) 20 MG tablet Take 1 tablet (20 mg total) by mouth daily with breakfast. 04/08/22 05/08/22 Yes Gorsuch, Ni, MD  ?senna (SENOKOT) 8.6 MG tablet Take 1 tablet by mouth 2 (two) times daily.   Yes [provider]  ?famotidine (PEPCID) 20 MG tablet Take 1 tablet (20 mg total) by mouth daily for 10 days. ?Patient not taking: Reported on 04/11/2022 02/15/22 03/17/22  Leath-Warren, Alda Lea, NP  ?hydrochlorothiazide (HYDRODIURIL) 25 MG tablet Take 1 tablet (25 mg total) by mouth daily. ?Patient not taking: Reported on 04/11/2022 12/04/21   [name], MD  ?   ? ?Allergies    ?Shellfish allergy and Penicillins   ? ?Review of Systems   ?Review of Systems  ?Neurological:  Positive for weakness.  ? ?Physical Exam ?Updated Vital Signs ?BP (!) 145/90   Pulse (!) 116   Temp 97.9 ?F (36.6 ?C) (Oral)   Resp 18   Ht '5\' 5"'$  (1.651 m)   Wt 84.4 kg   LMP 12/17/2003   SpO2 94%   BMI 30.95 kg/m?  ?Physical Exam ?Vitals and nursing note reviewed.  ?Constitutional:   ?   General: She is not in acute distress. ?   Appearance: She is well-developed. She is not ill-appearing.  ?HENT:  ?   Head: Normocephalic and atraumatic.  ?   Nose: Nose normal.  ?   Mouth/Throat:  ?   Mouth: Mucous membranes are moist.  ?Eyes:  ?   Extraocular Movements: Extraocular movements intact.  ?   Conjunctiva/sclera: Conjunctivae normal.  ?   Pupils: Pupils are equal, round, and reactive to light.  ?Cardiovascular:  ?   Rate and Rhythm: Regular rhythm. Tachycardia present.  ?   Heart sounds: No murmur heard. ?Pulmonary:  ?   Effort: No respiratory distress.  ?   Breath sounds: Wheezing present.  ?   Comments: Coarse breath sounds throughout, increased work of breathing ?Abdominal:  ?   Palpations: Abdomen  is soft.  ?   Tenderness: There is no abdominal tenderness.  ?Musculoskeletal:     ?   General: No swelling.  ?   Cervical back: Neck supple.  ?   Right lower leg: No edema.  ?   Left lower leg: No edema.  ?Skin: ?   General: Skin is warm and dry.  ?   Capillary Refill: Capillary refill takes less than 2 seconds.  ?Neurological:  ?   General: No focal deficit present.  ?   Mental Status: She is alert.  ?Psychiatric:     ?   Mood and Affect: Mood normal.  ? ? ?ED Results / Procedures / Treatments   ?Labs ?(all labs ordered are listed, but only abnormal results are displayed) ?Labs Reviewed  ?BASIC METABOLIC PANEL - Abnormal;  Notable for the following components:  ?    Result Value  ? Glucose, Bld 126 (*)   ? Calcium 8.4 (*)   ? All other components within normal limits  ?CBC - Abnormal; Notable for the following components:  ? WBC 2.6 (*)   ? RBC 2.22 (*)   ? Hemoglobin 7.8 (*)   ? HCT 22.0 (*)   ? MCH 35.1 (*)   ? RDW 20.7 (*)   ? Platelets 73 (*)   ? nRBC 2.7 (*)   ? All other components within normal limits  ?HEPATIC FUNCTION PANEL - Abnormal; Notable for the following components:  ? Albumin 3.4 (*)   ? Total Bilirubin 1.3 (*)   ? Bilirubin, Direct 0.3 (*)   ? Indirect Bilirubin 1.0 (*)   ? All other components within normal limits  ?CBG MONITORING, ED - Abnormal; Notable for the following components:  ? Glucose-Capillary 135 (*)   ? All other components within normal limits  ?RESP PANEL BY RT-PCR (FLU A&B, COVID) ARPGX2  ?TSH  ?BRAIN NATRIURETIC PEPTIDE  ?URINALYSIS, ROUTINE W REFLEX MICROSCOPIC  ?T4, FREE  ?TROPONIN I (HIGH SENSITIVITY)  ?TROPONIN I (HIGH SENSITIVITY)  ? ? ?EKG ?EKG Interpretation ? ?Date/Time:  Thursday April 11 2022 11:11:44 EDT ?Ventricular Rate:  116 ?PR Interval:  136 ?QRS Duration: 119 ?QT Interval:  345 ?QTC Calculation: 480 ?R Axis:   67 ?Text Interpretation: Sinus tachycardia Probable left atrial enlargement Right bundle branch block Confirmed by Ronnald Nian, Winnie Umali ([id number]) on 04/11/2022  11:53:15 AM ? ?Radiology ?DG Chest Portable 1 View ? ?Result Date: 04/11/2022 ?CLINICAL DATA:  Fatigue EXAM: PORTABLE CHEST 1 VIEW COMPARISON:  Portable exam 1150 hours compared to 03/11/2022 FINDINGS: RIGHT jugular Port-A-C

## 2022-04-11 NOTE — Telephone Encounter (Signed)
OK I will keep an eye on her chart ?

## 2022-04-12 DIAGNOSIS — Z7189 Other specified counseling: Secondary | ICD-10-CM

## 2022-04-12 DIAGNOSIS — E782 Mixed hyperlipidemia: Secondary | ICD-10-CM

## 2022-04-12 DIAGNOSIS — R0902 Hypoxemia: Secondary | ICD-10-CM | POA: Diagnosis not present

## 2022-04-12 DIAGNOSIS — K2100 Gastro-esophageal reflux disease with esophagitis, without bleeding: Secondary | ICD-10-CM

## 2022-04-12 DIAGNOSIS — E43 Unspecified severe protein-calorie malnutrition: Secondary | ICD-10-CM | POA: Insufficient documentation

## 2022-04-12 DIAGNOSIS — R531 Weakness: Secondary | ICD-10-CM | POA: Diagnosis not present

## 2022-04-12 DIAGNOSIS — D860 Sarcoidosis of lung: Secondary | ICD-10-CM

## 2022-04-12 DIAGNOSIS — R0602 Shortness of breath: Secondary | ICD-10-CM | POA: Diagnosis not present

## 2022-04-12 DIAGNOSIS — D61818 Other pancytopenia: Secondary | ICD-10-CM

## 2022-04-12 DIAGNOSIS — C541 Malignant neoplasm of endometrium: Secondary | ICD-10-CM

## 2022-04-12 DIAGNOSIS — I10 Essential (primary) hypertension: Secondary | ICD-10-CM

## 2022-04-12 DIAGNOSIS — R7989 Other specified abnormal findings of blood chemistry: Secondary | ICD-10-CM

## 2022-04-12 DIAGNOSIS — E039 Hypothyroidism, unspecified: Secondary | ICD-10-CM

## 2022-04-12 DIAGNOSIS — J9621 Acute and chronic respiratory failure with hypoxia: Secondary | ICD-10-CM

## 2022-04-12 LAB — COMPREHENSIVE METABOLIC PANEL
ALT: 21 U/L (ref 0–44)
AST: 18 U/L (ref 15–41)
Albumin: 3.1 g/dL — ABNORMAL LOW (ref 3.5–5.0)
Alkaline Phosphatase: 82 U/L (ref 38–126)
Anion gap: 6 (ref 5–15)
BUN: 27 mg/dL — ABNORMAL HIGH (ref 8–23)
CO2: 26 mmol/L (ref 22–32)
Calcium: 8.2 mg/dL — ABNORMAL LOW (ref 8.9–10.3)
Chloride: 105 mmol/L (ref 98–111)
Creatinine, Ser: 1.01 mg/dL — ABNORMAL HIGH (ref 0.44–1.00)
GFR, Estimated: >60 mL/min (ref >60)
Glucose, Bld: 200 mg/dL — ABNORMAL HIGH (ref 70–99)
Potassium: 4.2 mmol/L (ref 3.5–5.1)
Sodium: 137 mmol/L (ref 135–145)
Total Bilirubin: 0.9 mg/dL (ref 0.3–1.2)
Total Protein: 6.8 g/dL (ref 6.5–8.1)

## 2022-04-12 LAB — PREPARE RBC (CROSSMATCH)

## 2022-04-12 LAB — CK: Total CK: 53 U/L (ref 38–234)

## 2022-04-12 LAB — CBC
HCT: 20.3 % — ABNORMAL LOW (ref 36.0–46.0)
Hemoglobin: 7.3 g/dL — ABNORMAL LOW (ref 12.0–15.0)
MCH: 36.1 pg — ABNORMAL HIGH (ref 26.0–34.0)
MCHC: 36 g/dL (ref 30.0–36.0)
MCV: 100.5 fL — ABNORMAL HIGH (ref 80.0–100.0)
Platelets: 78 10*3/uL — ABNORMAL LOW (ref 150–400)
RBC: 2.02 MIL/uL — ABNORMAL LOW (ref 3.87–5.11)
RDW: 20.9 % — ABNORMAL HIGH (ref 11.5–15.5)
WBC: 3.2 10*3/uL — ABNORMAL LOW (ref 4.0–10.5)
nRBC: 1.6 % — ABNORMAL HIGH (ref 0.0–0.2)

## 2022-04-12 LAB — MAGNESIUM: Magnesium: 1.5 mg/dL — ABNORMAL LOW (ref 1.7–2.4)

## 2022-04-12 LAB — HEMOGLOBIN AND HEMATOCRIT, BLOOD
HCT: 24.5 % — ABNORMAL LOW (ref 36.0–46.0)
Hemoglobin: 8.7 g/dL — ABNORMAL LOW (ref 12.0–15.0)

## 2022-04-12 MED ORDER — ADULT MULTIVITAMIN W/MINERALS CH
1.0000 | ORAL_TABLET | Freq: Every day | ORAL | Status: DC
  Administered 2022-04-12 – 2022-04-15 (×4): 1 via ORAL
  Filled 2022-04-12 (×4): qty 1

## 2022-04-12 MED ORDER — SODIUM CHLORIDE 0.9% IV SOLUTION: Freq: Once | INTRAVENOUS | Status: AC

## 2022-04-12 MED ORDER — MORPHINE SULFATE (CONCENTRATE) 10 MG/0.5ML PO SOLN
5.0000 mg | Freq: Two times a day (BID) | ORAL | Status: DC
  Administered 2022-04-12 – 2022-04-15 (×5): 5 mg via ORAL
  Filled 2022-04-12 (×6): qty 0.5

## 2022-04-12 MED ORDER — OXYCODONE HCL 5 MG PO TABS: 5.0000 mg | ORAL_TABLET | ORAL | Status: DC | PRN

## 2022-04-12 MED ORDER — ATORVASTATIN CALCIUM 40 MG PO TABS
40.0000 mg | ORAL_TABLET | Freq: Every morning | ORAL | Status: DC
  Administered 2022-04-13 – 2022-04-15 (×3): 40 mg via ORAL
  Filled 2022-04-12 (×3): qty 1

## 2022-04-12 MED ORDER — AMPHETAMINE-DEXTROAMPHETAMINE 10 MG PO TABS
10.0000 mg | ORAL_TABLET | Freq: Every day | ORAL | Status: DC
  Administered 2022-04-14 – 2022-04-15 (×2): 10 mg via ORAL
  Filled 2022-04-12 (×3): qty 1

## 2022-04-12 MED ORDER — MAGNESIUM SULFATE 2 GM/50ML IV SOLN
2.0000 g | Freq: Once | INTRAVENOUS | Status: AC
  Administered 2022-04-12: 2 g via INTRAVENOUS
  Filled 2022-04-12: qty 50

## 2022-04-12 MED FILL — Fosaprepitant Dimeglumine For IV Infusion 150 MG (Base Eq): INTRAVENOUS | Qty: 5 | Status: AC

## 2022-04-12 MED FILL — Dexamethasone Sodium Phosphate Inj 100 MG/10ML: INTRAMUSCULAR | Qty: 1 | Status: AC

## 2022-04-12 NOTE — Assessment & Plan Note (Signed)
Continue prednisone 20 mg daily per pulmonology ?

## 2022-04-12 NOTE — Assessment & Plan Note (Addendum)
Likely from chemotherapy.  Hemoglobin improved after 1 unit.  Leukopenia and thrombocytopenia stable. ?

## 2022-04-12 NOTE — Assessment & Plan Note (Addendum)
Multifactorial including malignancy, chemotherapy, poor p.o. 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  TSH and CK within normal.  Cardiac enzymes within normal.  Low suspicion for infectious process.  ?Going home with home hospice.  Gave Rx for Adderall per palliative medicine recommendation ?Hospice to provide appropriate DME's ?

## 2022-04-12 NOTE — Assessment & Plan Note (Signed)
TSH within normal.  Continue home Synthroid. ?

## 2022-04-12 NOTE — Evaluation (Signed)
Physical Therapy Evaluation ?Patient Details ?Name: [name] ?MRN: [id number] ?DOB: [birth year] ?Today's Date: 04/12/2022 ? ?History of Present Illness ? 66 y.o. female with medical history significant of endometrial cancer, HTN, pulmonary sarcoidosis, HLD, GERD. Presenting with dyspnea and weakness. CT of chest negative for PE, findings consistent with pulmonary metastatic disease.  ?Clinical Impression ? Pt admitted with above diagnosis. Pt ambulated 4' with RW, distance limited by 3/4 dyspnea, SaO2 97% on room air. She has significantly decreased activity tolerance, WC recommended for home. Pt stated her family can provide 24/7 assist at home.  Pt currently with functional limitations due to the deficits listed below (see PT Problem List). Pt will benefit from skilled PT to increase their independence and safety with mobility to allow discharge to the venue listed below.   ?   ?   ? ?Recommendations for follow up therapy are one component of a multi-disciplinary discharge planning process, led by the attending physician.  Recommendations may be updated based on patient status, additional functional criteria and insurance authorization. ? ?Follow Up Recommendations Home health PT ? ?  ?Assistance Recommended at Discharge Frequent or constant Supervision/Assistance  ?Patient can return home with the following ? A little help with walking and/or transfers;A little help with bathing/dressing/bathroom;Assistance with cooking/housework;Assist for transportation;Help with stairs or ramp for entrance ? ?  ?Equipment Recommendations Wheelchair cushion (measurements PT);Wheelchair (measurements PT)  ?Recommendations for Other Services ?    ?  ?Functional Status Assessment Patient has had a recent decline in their functional status and demonstrates the ability to make significant improvements in function in a reasonable and predictable amount of time.  ? ?  ?Precautions / Restrictions Precautions ?Precautions: Fall  (Simultaneous filing. User may not have seen previous data.) ?Precaution Comments: fell 1 month ago and hurt her elbow on the wall ?Restrictions ?Weight Bearing Restrictions: No (Simultaneous filing. User may not have seen previous data.)  ? ?  ? ?Mobility ? Bed Mobility ?  ?  ?  ?  ?  ?  ?  ?General bed mobility comments: up in recliner ?  ? ?Transfers ?Overall transfer level: Needs assistance (Simultaneous filing. User may not have seen previous data.) ?Equipment used: Rolling walker (2 wheels) ?Transfers: Sit to/from Stand ?  ?  ?  ?  ?  ?  ?General transfer comment: VCs hand placement ?  ? ?Ambulation/Gait ?Ambulation/Gait assistance: Min guard ?Gait Distance (Feet): 4 Feet ?Assistive device: Rolling walker (2 wheels) ?Gait Pattern/deviations: Decreased stride length, Step-through pattern ?Gait velocity: decr ?  ?  ?General Gait Details: 2 steps forwards and 2 steps back, tolerance limited by 3/4 dyspnea, SaO2 97% on room air with walking ? ?Stairs ?  ?  ?  ?  ?  ? ?Wheelchair Mobility ?  ? ?Modified Rankin (Stroke Patients Only) ?  ? ?  ? ?Balance Overall balance assessment: Needs assistance ?Sitting-balance support: No upper extremity supported ?Sitting balance-Leahy Scale: Good ?  ?  ?Standing balance support: Single extremity supported ?Standing balance-Leahy Scale: Fair ?  ?  ?  ?  ?  ?  ?  ?  ?  ?  ?  ?  ?   ? ? ? ?Pertinent Vitals/Pain Pain Assessment ?Pain Assessment: 0-10 (5/10 R elbow, pain meds requested)  ? ? ?Home Living Family/patient expects to be discharged to:: Private residence ?Living Arrangements: Children ?Available Help at Discharge: Family;Available PRN/intermittently ?Type of Home: House ?Home Access: Stairs to enter ?Entrance Stairs-Rails: Can reach both ?Entrance Stairs-Number of Steps: 1 ?  ?  Home Layout: One level ?Home Equipment: Air cabin crew (4 wheels) ?Additional Comments: pt stated she lives with her daughter who works, she stated family is working on arranging 24/7 assist  at home  ?  ?Prior Function Prior Level of Function : Independent/Modified Independent;Driving ?  ?  ?  ?  ?  ?  ?Mobility Comments: uses RW occasionally in community - doesn't use in home, furniture walks in home ?ADLs Comments: independent, uses shower seat in shower but reports increasingly difficult ?  ? ? ?Hand Dominance  ? Dominant Hand: Right ? ?  ?Extremity/Trunk Assessment  ? Upper Extremity Assessment ?Upper Extremity Assessment: Defer to OT evaluation ?RUE Deficits / Details: Grossly functional shoulder ROm though less than left, unable to fully flex or extend elbow, grip strength weaker (patient hurt elbow a month ago. still painful and she is guarding.) ?RUE Sensation: WNL ?RUE Coordination: WNL ?LUE Deficits / Details: WFL ROM and strength ?LUE Sensation: WNL ?LUE Coordination: WNL ?  ? ?Lower Extremity Assessment ?Lower Extremity Assessment: Overall WFL for tasks assessed ?  ? ?Cervical / Trunk Assessment ?Cervical / Trunk Assessment: Normal  ?Communication  ? Communication: No difficulties  ?Cognition Arousal/Alertness: Awake/alert ?Behavior During Therapy: [facility] for tasks assessed/performed ?Overall Cognitive Status: Within Functional Limits for tasks assessed ?  ?  ?  ?  ?  ?  ?  ?  ?  ?  ?  ?  ?  ?  ?  ?  ?  ?  ?  ? ?  ?General Comments   ? ?  ?Exercises    ? ?Assessment/Plan  ?  ?PT Assessment Patient needs continued PT services  ?PT Problem List Cardiopulmonary status limiting activity;Decreased mobility;Decreased activity tolerance;Decreased balance;Pain ? ?   ?  ?PT Treatment Interventions Gait training;Therapeutic exercise;Functional mobility training;Therapeutic activities;Patient/family education   ? ?PT Goals (Current goals can be found in the Care Plan section)  ?Acute Rehab PT Goals ?Patient Stated Goal: pt plans to DC home ?PT Goal Formulation: With patient ?Time For Goal Achievement: 04/26/22 ?Potential to Achieve Goals: Fair ? ?  ?Frequency Min 3X/week ?  ? ? ?Co-evaluation   ?  ?  ?  ?   ? ? ?  ?AM-PAC PT "6 Clicks" Mobility  ?Outcome Measure Help needed turning from your back to your side while in a flat bed without using bedrails?: A Little ?Help needed moving from lying on your back to sitting on the side of a flat bed without using bedrails?: A Little ?Help needed moving to and from a bed to a chair (including a wheelchair)?: A Little ?Help needed standing up from a chair using your arms (e.g., wheelchair or bedside chair)?: A Little ?Help needed to walk in hospital room?: A Little ?Help needed climbing 3-5 steps with a railing? : A Lot ?6 Click Score: 17 ? ?  ?End of Session Equipment Utilized During Treatment: Gait belt ?Activity Tolerance: Patient limited by fatigue ?Patient left: in chair;with call bell/phone within reach ?Nurse Communication: Mobility status ?PT Visit Diagnosis: Difficulty in walking, not elsewhere classified (R26.2) ?  ? ?Time: 7017-7939 ?PT Time Calculation (min) (ACUTE ONLY): 16 min ? ? ?Charges:   PT Evaluation ?$PT Eval Moderate Complexity: 1 Mod ?  ?  ?   ? ?Blondell Reveal Kistler PT 04/12/2022  ?Acute Rehabilitation Services ?Pager [phone] ?Office [phone] ? ? ?

## 2022-04-12 NOTE — Assessment & Plan Note (Addendum)
Resolved

## 2022-04-12 NOTE — Hospital Course (Addendum)
66 year old F with PMH of progressive endometrial cancer despite total hysterectomy with BSO now on chemo, pulmonary sarcoidosis on steroid, anemia, HLD, GERD and recent hospitalization for acute respiratory failure with hypoxia due to progressive metastatic disease and postobstructive right lung consolidation returning with progressive weakness and shortness of breath, and admitted for the same.  CBC significant for pancytopenia with WBC of 2.6, Hgb of 7.8 and platelet of 73.  Cardiac markers including BNP and troponin negative.  CTA chest negative for PE but decreased linear opacities of RLL and RML, severe narrowing of proximal RLL and RML bronchi concerning for metastatic adenopathy superimposed on sarcoidosis and unchanged bilateral solid pulmonary nodules similar to her recent CT chest on 3/28.  Pulmonology and palliative medicine consulted.   ? ?Hemoglobin reached nadir at 7.3.  Transfused 1 unit with appropriate response and improvement in his symptoms.  Palliative medicine started Roxanol and Adderall.  CODE STATUS changed to DNR/DNI.  Eventually, the decision is made to stop further chemotherapy and go home with home hospice.  TOC helped with referral to [place] per patient and family preference. ? ? ? ?

## 2022-04-12 NOTE — Assessment & Plan Note (Signed)
Normotensive. ?-Continue home bisoprolol ?

## 2022-04-12 NOTE — Progress Notes (Signed)
Initial Nutrition Assessment ? ?DOCUMENTATION CODES:  ? ?Severe malnutrition in context of chronic illness, Obesity unspecified ? ?INTERVENTION:  ?- Liberalize diet from a heart healthy to a regular diet to provide widest variety of menu options to enhance nutritional adequacy ? ?- Ensure Enlive po BID, each supplement provides 350 kcal and 20 grams of protein. ? ?- MVI with minerals daily ? ?NUTRITION DIAGNOSIS:  ? ?Severe Malnutrition related to chronic illness (endometrial cancer) as evidenced by energy intake < or equal to 75% for > or equal to 1 month, percent weight loss. ? ?GOAL:  ? ?Patient will meet greater than or equal to 90% of their needs ? ?MONITOR:  ? ?PO intake, Supplement acceptance, Diet advancement, Labs, Weight trends ? ?REASON FOR ASSESSMENT:  ? ?Malnutrition Screening Tool ?  ? ?ASSESSMENT:  ? ?Pt admitted fatigue, generalized weakness and hypoxia. PMH significant for endometrial cancer, HTN, pulmonary sarcoidosis, HLD, and GERD. ? ?Pt is pleasant, sitting up in chair. She states that her appetite and PO intake has been very poor. Pt states that her Oncologist has been encouraging her to eat ~6 small meals throughout the day as well as continuing to drink Ensure after meals. Pt has been trying to eat hamburgers, chicken, PB crackers, grapes and drinks 1-2 Ensure daily. She endorses difficulty with water intake. Encouraged pt to continue eating smaller meals throughout the day and drinking Ensure to help with fluid intake. Pt denies difficulty chewing or swallowing. Her mobility has been limited as she is feeling weak and uses a walker or the wall for assistance.  ? ?Pt would benefit from a liberalized diet to encourage adequate PO intake. Will also continue with nutrition supplements.  ? ?Pt states that ~1 year ago her weight was around 203 lbs and endorses weight loss with a current weight of 181 lbs. Reviewed weight history. Pt has had a 16% weight loss within the last 6 months (since  09/25/21) which is significant for time frame.  ? ?Medications: synthroid, mag-ox, protonix, miralax, prednisone, senna ? ?Labs: BUN 27, Cr 1.01, correct calcium 8.92 ? ?NUTRITION - FOCUSED PHYSICAL EXAM: ? ?Flowsheet Row Most Recent Value  ?Orbital Region No depletion  ?Upper Arm Region No depletion  ?Thoracic and Lumbar Region No depletion  ?Buccal Region No depletion  ?Temple Region No depletion  ?Clavicle Bone Region No depletion  ?Clavicle and Acromion Bone Region No depletion  ?Scapular Bone Region No depletion  ?Dorsal Hand No depletion  ?Patellar Region No depletion  ?Anterior Thigh Region No depletion  ?Posterior Calf Region Mild depletion  ?Edema (RD Assessment) Mild  [non-pitting BLE]  ?Hair Reviewed  ?Eyes Reviewed  ?Mouth Reviewed  ?Skin Reviewed  ?Nails Reviewed  ? ?  ? ? ?Diet Order:   ?Diet Order   ? ?       ?  Diet regular Room service appropriate? Yes; Fluid consistency: Thin  Diet effective now       ?  ? ?  ?  ? ?  ? ? ?EDUCATION NEEDS:  ? ?Education needs have been addressed ? ?Skin:  Skin Assessment: Reviewed RN Assessment ? ?Last BM:  4/27 (type 5) ? ?Height:  ? ?Ht Readings from Last 1 Encounters:  ?04/11/22 '5\' 5"'$  (1.651 m)  ? ? ?Weight:  ? ?Wt Readings from Last 1 Encounters:  ?04/11/22 84.4 kg  ? ? ?Ideal Body Weight:  56.8 kg ? ?BMI:  Body mass index is 30.95 kg/m?. ? ?Estimated Nutritional Needs:  ? ?Kcal:  1900-2100 ? ?  Protein:  95-110g ? ?Fluid:  >/=1.9L ? ?[name], RDN, LDN ?Clinical Nutrition ?

## 2022-04-12 NOTE — Consult Note (Signed)
? ?NAME:  [name], MRN:  [id number], DOB:  Nov 23, 1956, LOS: 1 ?ADMISSION DATE:  04/11/2022, CONSULTATION DATE:  04/12/2022 ?REFERRING MD:  Dr. Cyndia Skeeters, CHIEF COMPLAINT: Dyspnea ? ?History of Present Illness:  ?[name] is a 66 year old female with a past medical history significant for endometrial cancer, HTN, pulmonary sarcoidosis, HLD, and GERD admitted to [facility] long ED 4/27 with complaints of progressive dyspnea and weakness.  Patient reports symptoms began yesterday while ambulating and is no longer responsive to inhalers. ? ?On ED arrival patient was seen mildly tachypneic and tachycardic with all other vital signs within normal limits.  Work was significant for mild hyperglycemia, albumin 3.4, indirect bilirubin elevated at 1.0, WBC 2.6, hemoglobin 7.8.  CTA chest revealed no PE persistent metastatic adenopathy and solid pulmonary nodules. patient was admitted per [facility] for management of dyspnea and pancytopenia.  PCCM consulted for assistance in management. ? ?Pertinent  Medical History  ?Endometrial cancer, HTN, pulmonary sarcoidosis, HLD, and GERD ? ?Significant Hospital Events: ?Including procedures, antibiotic start and stop dates in addition to other pertinent events   ?4/27 presented to ED for complaints of persistent progressive shortness of breath ?4/28 no acute events overnight, IR evaluated for possible thoracentesis but no safe window was a seen ? ?Interim History / Subjective:  ?Sitting up in bedside recliner with no acute complaints.  States dyspnea is much improved but patient has not yet back to pulmonary baseline ? ?Objective   ?Blood pressure 114/83, pulse 83, temperature 98 ?F (36.7 ?C), temperature source Oral, resp. rate 15, height '5\' 5"'$  (1.651 m), weight 84.4 kg, last menstrual period 12/17/2003, SpO2 97 %. ?   ?FiO2 (%):  [28 %] 28 %  ?No intake or output data in the 24 hours ending 04/12/22 1059 ?Filed Weights  ? 04/11/22 1117  ?Weight: 84.4 kg  ? ? ?Examination: ?General: Acute  on chronic ill-appearing middle-aged female sitting up in bedside recliner in no acute distress ?HEENT: [place]/AT, MM pink/moist, PERRL,  ?Neuro: Alert and oriented x3, nonfocal ?CV: s1s2 regular rate and rhythm, no murmur, rubs, or gallops,  ?PULM: Clear to auscultation bilaterally, decreased at bases, no increased work of breathing, on 2 L nasal cannula ?GI: soft, bowel sounds active in all 4 quadrants, non-tender, non-distended, tolerating oral diet ?Extremities: warm/dry, no edema  ?Skin: no rashes or lesions] ? ?Resolved Hospital Problem list   ? ? ?Assessment & Plan:  ?History of pulmonary sarcoidosis ?-Follows with Dr. Melvyn Novas in the pulmonary office ?-Patient was previously managed with 40 mg p.o. 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  Prednisone was tapered to 20 mg daily ?Abnormal CT chest ?-CTA chest 4/27 revealed severe narrowing of the proximal right lower lobe and right middle lobe bronchus with solid bilateral pulmonary nodules concerning for possible metastatic adenopathy of the chest superimposed on sarcoidosis ?Endometrial cancer ?-Currently undergoing chemotherapy, managed by Dr. Alvy Bimler ?P: ?Interventional radiology evaluated patient for possible thoracentesis but no safe window was identified 4/28 ?Patient denies noticeable change in dyspnea near de-escalation of steroid therapy ?We will discuss with oncology regarding need for possible bronchoscopy to determine metastatic disease ?Aggressive pulmonary hygiene ?Mobilize as able ?Ensure adequate pain control ?Currently remains on 20 mg prednisone daily Will discuss with attending potential need for escalation ?Need to continue discussing goals of care ? ?Best Practice (right click and "Reselect all SmartList Selections" daily)  ? ?Per primary ? ?Labs   ?CBC: ?Recent Labs  ?Lab 04/11/22 ?1213 04/12/22 ?0426  ?  WBC 2.6* 3.2*  ?HGB 7.8* 7.3*  ?HCT 22.0* 20.3*  ?MCV 99.1  100.5*  ?PLT 73* 78*  ? ? ?Basic Metabolic Panel: ?Recent Labs  ?Lab 04/11/22 ?1213 04/12/22 ?0426  ?NA 139 137  ?K 3.5 4.2  ?CL 103 105  ?CO2 27 26  ?GLUCOSE 126* 200*  ?BUN 20 27*  ?CREATININE 0.83 1.01*  ?CALCIUM 8.4* 8.2*  ? ?GFR: ?Estimated Creatinine Clearance: 61.2 mL/min (A) (by C-G formula based on SCr of 1.01 mg/dL (H)). ?Recent Labs  ?Lab 04/11/22 ?1213 04/12/22 ?0426  ?WBC 2.6* 3.2*  ? ? ?Liver Function Tests: ?Recent Labs  ?Lab 04/11/22 ?1213 04/12/22 ?0426  ?AST 19 18  ?ALT 21 21  ?ALKPHOS 87 82  ?BILITOT 1.3* 0.9  ?PROT 7.0 6.8  ?ALBUMIN 3.4* 3.1*  ? ?No results for input(s): LIPASE, AMYLASE in the last 168 hours. ?No results for input(s): AMMONIA in the last 168 hours. ? ?ABG ?   ?Component Value Date/Time  ? PHART 7.45 04/11/2022 1826  ? PCO2ART 40 04/11/2022 1826  ? PO2ART 76 (L) 04/11/2022 1826  ? HCO3 27.8 04/11/2022 1826  ? TCO2 28 11/28/2016 1412  ? O2SAT 94.5 04/11/2022 1826  ?  ? ?Coagulation Profile: ?No results for input(s): INR, PROTIME in the last 168 hours. ? ?Cardiac Enzymes: ?No results for input(s): CKTOTAL, CKMB, CKMBINDEX, TROPONINI in the last 168 hours. ? ?HbA1C: ?Hgb A1c MFr Bld  ?Date/Time Value Ref Range Status  ?02/18/2022 03:40 AM 4.8 4.8 - 5.6 % Final  ?  Comment:  ?  (NOTE) ?Pre diabetes:          5.7%-6.4% ? ?Diabetes:              >6.4% ? ?Glycemic control for   <7.0% ?adults with diabetes ?  ?03/01/2020 10:53 AM 6.3 4.6 - 6.5 % Final  ?  Comment:  ?  Glycemic Control Guidelines for People with Diabetes:Non Diabetic:  <6%Goal of Therapy: <7%Additional Action Suggested:  >8%   ? ? ?CBG: ?Recent Labs  ?Lab 04/11/22 ?1141  ?GLUCAP 135*  ? ? ?Review of Systems:   ?Please see the history of present illness. All other systems reviewed and are negative  ? 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  ? ?Surgical History:  ? ?Past Surgical History:  ?Procedure Laterality Date  ? BRONCHOSCOPY  01/22/2008  ? w/ Lavage and bronchial bx  ? CARDIOVASCULAR STRESS TEST  06/27/2006  ? normal nuclear study w/ no ischemia/  normal LV function and wall motion , ef 61%  ? CARPAL TUNNEL RELEASE Right 01/17/2016  ? and finger trigger release  ? CLEFT LIP REPAIR    ? CO2 LASER ABLATION VULVA    ? EUA/  EXCISIONAL MULTIPLE PERINEAL/ANAL BX'S/  SIGMOIDSCOPY/  PORT-A-CATH PLACEMENT  03/27/2004  ? Local excision - Dr Lennie Hummer  ? IR IMAGING GUIDED PORT INSERTION  05/26/2020  ? LAPAROSCOPIC CHOLECYSTECTOMY  08/14/2008  ? MULTIPLE ANAL BX'S  10/10/2006  ? RELEASE A1 PULLEY THUMB Bilateral left 12-17-2006/  right 09-05-2009  ? REMOVAL PORT-A-CATH/  EUA/  MULTIPLE ANAL BX'S  10/16/2004  ? ROBOTIC ASSISTED TOTAL HYSTERECTOMY WITH BILATERAL SALPINGO OOPHERECTOMY Bilateral 06/25/2018  ? Procedure: XI ROBOTIC ASSISTED TOTAL HYSTERECTOMY WITH BILATERAL SALPINGO OOPHORECTOMY;  Surgeon: Everitt Amber, MD;  Location: [facility];  Service: Gynecology;  Laterality: Bilateral;  ? SENTINEL NODE BIOPSY N/A 06/25/2018  ? Procedure: SENTINEL NODE BIOPSY;  Surgeon: Everitt Amber, MD;  Location: [facility];  Service: Gynecology;  Laterality: N/A;  ? TENNIS ELBOW RELEASE/NIRSCHEL PROCEDURE Right 11/28/2016  ? Procedure: RIGHT ELBOW ULNER NERVE RELEASE AND OR TRANSPOSITION;  Surgeon: Iran Planas, MD;  Location: [place];  Service: Orthopedics;  Laterality: Right;  ?  ? ?Social History:  ? reports that she quit smoking about 28 years ago. Her  smoking use included cigarettes. She has a 0.50 pack-year smoking history. She has never used smokeless tobacco. She reports that she does not currently use alcohol. She reports that she does not use dr

## 2022-04-12 NOTE — Consult Note (Signed)
? ?                                                                                ?Consultation Note ?Date: 04/12/2022  ? ?Patient Name: [name]  ?DOB: 09/25/1956  MRN: [id number]  Age / Sex: 66 y.o., female  ?PCP: [name], [name], MD ?Referring Physician: Mercy Riding, MD ? ?Reason for Consultation: Establishing goals of care and Other ? ?HPI/Patient Profile: 66 y.o. female with stage 4 endometrial cancer and pulmonary sarcoidosis admitted with weakness and worsening dyspnea with exertion. Palliative care consulted for assistance with goals of care, declining functional status and dyspnea management. Patient is being cared for by Dr. Alvy Bimler in [facility]. She has been struggling with steroid induced myopathy, fatigue and has been receiving chemotherapy. ? ?Assessment/Recommendations: ? ?Stage IV, metastatic Uterine Cancer- unclear if lung issues are sarcoid related or metastatic-discussed with pulmonary NP. She has been on longstanding high dose steroids which may now be causing complications such as myopathy. ?Treatment and further evaluation per oncology and pulmonary ?Maintain steroid dosing ? ?Functional status decline and dyspnea - these are likely related-she is self limiting her activity level to avoid experiencing air hunger. If we can control her dyspnea her activity level may improve. Reduced activity can worsen steroid myopathy. ?Start very low dose of Roxanol '5mg'$  BID just see how she does with this and if it helps with her dyspnea- can increase if tolerated or she feels some relief.  ?Maximize pulmonary treatments ?Mobilize as tolerated ? ?Cancer related fatigue and weakness, multifactorial: ?Will try a low dose stimulant in AM to see if this improves things for her. ? ?Goals of Care ?Annaka welcomed a conversation about her attitudes and beliefs about her cancer and expectations of her illness trajectory. She is very worried about her children and grand-babies about their level of acceptance of her  prognosis and treatment. She tells me she is at peace with everything - the hardest part is known that she wont be here to see her grandbabies and there is grief weighing on her. She also does not want to be a burden on her children. She wants to maximize her functional status as much as possible so she can be independent. She tells me she doesn't want to give up but its harder for her to continue in her current situation. We discussed the pressure to fight in cancer patient and and how the fight may change to less about beating the cancer to more about how to make the times she has left the most meaningful to her family- we talked about writing letters to her grandchildren that they can read in the future and how to create her legacy.Will place a chaplain consult for additional spiritual support. ? ?  ? ?Primary Diagnoses: ?Present on Admission: ? (Resolved) Acute on chronic respiratory failure with hypoxia (HCC) ? Endometrial cancer ([place]) ? Sarcoidosis ([place]) ? Essential hypertension ? Hyperlipidemia, mixed ? ? ?I have reviewed the medical record, interviewed the patient and family, and examined the patient. The following aspects are pertinent. ? ?Past Medical History:  ?Diagnosis Date  ? Bifid uvula   ? Blood transfusion without reported diagnosis   ?  Endometrial ca [facility]) dx'd 06/2018  ? chemo  ? GERD (gastroesophageal reflux disease)   ? History of cervical dysplasia   ? History of chemotherapy   ? 06/ 2005  ? History of cleft lip   ? CORRECTED  ? History of condyloma acuminatum   ? PERIANAL  ? History of radiation therapy   ? completed 06/ 2005  ? History of vulvar dysplasia   ? Hypertension   ? 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 Pulmonary nodule, left   ? Pulmonary sarcoidosis ([place]) since 1996  ? PULMOLOGIST-  DR Melvyn Novas--  CHRONIC PREDNISONE  ? Sarcoid uveitis of left eye goes to [facility]  ? intermittantly--  currently no issues per pt 11-27-2016  ? Squamous cell cancer, anus (HCC) dx 04/ 2005   ? S/P  local resection 03/2004.  ChemoXRT 2005 Dr Collier Salina Rubin/[facility]  ? Upper airway cough syndrome   ? Wears glasses   ? ?Social History  ? ?Socioeconomic History  ? Marital status: Single  ?  Spouse name: Not on file  ? Number of children: 2  ? Years of education: Not on file  ? Highest education level: Not on file  ?Occupational History  ? Occupation: retired Quarry manager  ?  Employer: [employer]  ?Tobacco Use  ? Smoking status: Former  ?  Packs/day: 0.50  ?  Years: 1.00  ?  Pack years: 0.50  ?  Types: Cigarettes  ?  Quit date: 12/16/1993  ?  Years since quitting: 28.3  ? Smokeless tobacco: Never  ?Vaping Use  ? Vaping Use: Never used  ?Substance and Sexual Activity  ? Alcohol use: Not Currently  ?  Comment: occasional  ? Drug use: No  ? Sexual activity: Not Currently  ?Other Topics Concern  ? Not on file  ?Social History Narrative  ? Not on file  ? ?Social Determinants of Health  ? ?Financial Resource Strain: Low Risk   ? Difficulty of Paying Living Expenses: Not hard at all  ?Food Insecurity: No Food Insecurity  ? Worried About Charity fundraiser in the Last Year: Never true  ? Ran Out of Food in the Last Year: Never true  ?Transportation Needs: No Transportation Needs  ? Lack of Transportation (Medical): No  ? Lack of Transportation (Non-Medical): No  ?Physical Activity: Sufficiently Active  ? Days of Exercise per Week: 3 days  ? Minutes of Exercise per Session: 60 min  ?Stress: No Stress Concern Present  ? Feeling of Stress : Not at all  ?Social Connections: Not on file  ? ?Family History  ?Problem Relation Age of Onset  ? Allergies Mother   ? Asthma Mother   ? Lung cancer Mother   ? Allergies Sister   ? Allergies Brother   ? Asthma Brother   ? Asthma Sister   ? Sarcoidosis Neg Hx   ? Colon cancer Neg Hx   ? Liver cancer Neg Hx   ? Stomach cancer Neg Hx   ? Esophageal cancer Neg Hx   ? Rectal cancer Neg Hx   ? ?Scheduled Meds: ? sodium chloride   Intravenous Once  ? [START ON 04/13/2022] amphetamine-dextroamphetamine  10  mg Oral Q breakfast  ? atorvastatin  80 mg Oral q morning  ? bisoprolol  10 mg Oral Daily  ? Chlorhexidine Gluconate Cloth  6 each Topical Daily  ? feeding supplement  237 mL Oral BID BM  ? ipratropium-albuterol  3 mL Nebulization TID  ?  levothyroxine  100 mcg Oral QAC breakfast  ? loratadine  10 mg Oral Daily  ? magnesium oxide  400 mg Oral q morning  ? mometasone-formoterol  2 puff Inhalation BID  ? morphine CONCENTRATE  5 mg Oral BID  ? pantoprazole  40 mg Oral QHS  ? polyethylene glycol  17 g Oral BID  ? predniSONE  20 mg Oral Q breakfast  ? senna-docusate  1 tablet Oral BID  ? sodium chloride flush  10-40 mL Intracatheter Q12H  ? ?Continuous Infusions: ?PRN Meds:.acetaminophen **OR** acetaminophen, albuterol, guaiFENesin, oxyCODONE, sodium chloride flush ?Medications Prior to Admission:  ?Prior to Admission medications   ?Medication Sig Start Date End Date Taking? Authorizing Provider  ?albuterol (PROAIR HFA) 108 (90 Base) MCG/ACT inhaler 2 puffs every 4 hours as needed only  if your can't catch your breath ?Patient taking differently: Inhale 2 puffs into the lungs every 4 (four) hours as needed for wheezing or shortness of breath. 10/31/21  Yes Tanda Rockers, MD  ?aspirin 81 MG EC tablet Take 1 tablet (81 mg total) by mouth daily. Swallow whole. ?Patient taking differently: Take 81 mg by mouth every morning. Swallow whole. 02/19/22 05/20/22 Yes Barb Merino, MD  ?atorvastatin (LIPITOR) 80 MG tablet Take 1 tablet (80 mg total) by mouth daily. ?Patient taking differently: Take 80 mg by mouth every morning. 02/19/22 05/20/22 Yes Barb Merino, MD  ?bisoprolol (ZEBETA) 10 MG tablet Take 1 tablet (10 mg total) by mouth in the morning and at bedtime. 12/12/21  Yes [name], MD  ?cetirizine (ALLERGY, CETIRIZINE,) 10 MG tablet Take 1 tablet (10 mg total) by mouth daily. ?Patient taking differently: Take 10 mg by mouth daily as needed for allergies. 02/15/22 04/11/22 Yes Leath-Warren, Alda Lea, NP  ?desonide (DESOWEN)  0.05 % lotion Apply topically 2 (two) times daily. Continue following with dermatologist ?Patient taking differently: Apply 1 application. topically 2 (two) times daily as needed (skin irritation). Cont

## 2022-04-12 NOTE — Assessment & Plan Note (Addendum)
Decided to stop chemotherapy and go home with home hospice to focus on quality of life and comfort ?

## 2022-04-12 NOTE — Assessment & Plan Note (Addendum)
Multifactorial including metastasis from endometrial cancer and pulmonary sarcoidosis with mass effect, anemia and chemotherapy.  PE excluded by CTA chest.  Cardiac markers negative.  Maintain saturation at 97% with ambulation on RA but very dyspneic.  Symptoms improved after blood transfusion. ?-Decision made to go home with home hospice ?-Continue home prednisone for pulmonary sarcoidosis ?-Continue home Symbicort.  Added DuoNeb as needed. ?-Added Roxanol per recommendation by palliative medicine ?

## 2022-04-12 NOTE — Assessment & Plan Note (Addendum)
Increase Protonix to twice daily while on steroid ?

## 2022-04-12 NOTE — Evaluation (Signed)
Occupational Therapy Evaluation ?Patient Details ?Name: [name] ?MRN: [id number] ?DOB: 1955/12/26 ?Today's Date: 04/12/2022 ? ? ?History of Present Illness 66 y.o. female with medical history significant of endometrial cancer, HTN, pulmonary sarcoidosis, HLD, GERD. Presenting with dyspnea and weakness. CT of chest negative for PE, findings consistent with pulmonary metastatic disease.  ? ?Clinical Impression ?  ?Mrs. [name] is a 66 year old woman who presents with generalized weakness, decreased activity tolerance, impaired ROM and strength of right elbow, impaired balance and cardiopulmonary endurance  and pain. Overall patient is min guard for standing and transfers and min assist for LB ADLs. She is currently on 2 L [place] and exhibits significant dyspnea with minimal activity though o2 sats predominantly in the 90s. Some difficulty with assessing o2 sats and increased amount of time to assess as left hand reading 97% and right hand at times reading 87% - all on RA. Eventually they equaled out to about 91% on RA. Placed [place] back on for safety. Patient is slightly below her baseline. Therapist recommended considering increased supervision and assistance from family during the day - as she is typically alone while her daughter works. Recommend HH OT and BSC at discharge. Patient will benefit from skilled OT services while in hospital to improve deficits and learn compensatory strategies as needed in order to return to PLOF.    ?   ? ?Recommendations for follow up therapy are one component of a multi-disciplinary discharge planning process, led by the attending physician.  Recommendations may be updated based on patient status, additional functional criteria and insurance authorization.  ? ?Follow Up Recommendations ? Home health OT  ?  ?Assistance Recommended at Discharge Intermittent Supervision/Assistance  ?Patient can return home with the following A little help with bathing/dressing/bathroom;Assistance  with cooking/housework;Help with stairs or ramp for entrance;Assist for transportation ? ?  ?Functional Status Assessment ? Patient has had a recent decline in their functional status and demonstrates the ability to make significant improvements in function in a reasonable and predictable amount of time.  ?Equipment Recommendations ? BSC/3in1  ?  ?Recommendations for Other Services   ? ? ?  ?Precautions / Restrictions Precautions ?Precautions: Fall (Simultaneous filing. User may not have seen previous data.) ?Precaution Comments: fell 1 month ago and hurt her elbow on the wall ?Restrictions ?Weight Bearing Restrictions: No (Simultaneous filing. User may not have seen previous data.)  ? ?  ? ?Mobility Bed Mobility ?Overal bed mobility: Needs Assistance ?Bed Mobility: Supine to Sit ?  ?  ?Supine to sit: Supervision, HOB elevated ?  ?  ?  ?  ? ?Transfers ?Overall transfer level: Needs assistance ?Equipment used: None ?Transfers: Sit to/from Stand, Bed to chair/wheelchair/BSC ?Sit to Stand: Min guard ?  ?  ?Step pivot transfers: Min guard ?  ?  ?  ?  ? ?  ?Balance Overall balance assessment: Needs assistance ?Sitting-balance support: No upper extremity supported ?Sitting balance-Leahy Scale: Good ?  ?  ?Standing balance support: Single extremity supported ?Standing balance-Leahy Scale: Fair ?  ?  ?  ?  ?  ?  ?  ?  ?  ?  ?  ?  ?   ? ?ADL either performed or assessed with clinical judgement  ? ?ADL Overall ADL's : Needs assistance/impaired ?Eating/Feeding: Independent ?  ?Grooming: Set up;Sitting ?  ?Upper Body Bathing: Set up;Sitting ?  ?Lower Body Bathing: Minimal assistance;Sit to/from stand ?  ?Upper Body Dressing : Set up;Sitting ?  ?Lower Body Dressing: Minimal assistance;Sit to/from  stand ?  ?Toilet Transfer: Min guard;BSC/3in1 ?  ?Toileting- Water quality scientist and Hygiene: Min guard;Sit to/from stand ?  ?  ?  ?Functional mobility during ADLs: Min guard ?General ADL Comments: Min guard to transfer to recliner on  2 L [place].  ? ? ? ?Vision Baseline Vision/History: 1 Wears glasses ?   ?   ?Perception   ?  ?Praxis   ?  ? ?Pertinent Vitals/Pain Pain Assessment ?Pain Assessment: Faces ?Faces Pain Scale: Hurts little more ?Pain Location: R elbow ?Pain Descriptors / Indicators: Grimacing, Aching ?Pain Intervention(s): Limited activity within patient's tolerance  ? ? ? ?Hand Dominance Right ?  ?Extremity/Trunk Assessment Upper Extremity Assessment ?Upper Extremity Assessment: Defer to OT evaluation ?RUE Deficits / Details: Grossly functional shoulder ROm though less than left, unable to fully flex or extend elbow, grip strength weaker (patient hurt elbow a month ago. still painful and she is guarding.) ?RUE Sensation: WNL ?RUE Coordination: WNL ?LUE Deficits / Details: WFL ROM and strength ?LUE Sensation: WNL ?LUE Coordination: WNL ?  ?Lower Extremity Assessment ?Lower Extremity Assessment: Overall WFL for tasks assessed ?  ?Cervical / Trunk Assessment ?Cervical / Trunk Assessment: Normal ?  ?Communication Communication ?Communication: No difficulties ?  ?Cognition Arousal/Alertness: Awake/alert ?Behavior During Therapy: [facility] for tasks assessed/performed ?Overall Cognitive Status: Within Functional Limits for tasks assessed ?  ?  ?  ?  ?  ?  ?  ?  ?  ?  ?  ?  ?  ?  ?  ?  ?  ?  ?  ?General Comments    ? ?  ?Exercises   ?  ?Shoulder Instructions    ? ? ?Home Living Family/patient expects to be discharged to:: Private residence ?Living Arrangements: Children ?Available Help at Discharge: Family;Available PRN/intermittently ?Type of Home: House ?Home Access: Stairs to enter ?Entrance Stairs-Number of Steps: 1 ?Entrance Stairs-Rails: Can reach both ?Home Layout: One level ?  ?  ?Bathroom Shower/Tub: Walk-in shower ?  ?Bathroom Toilet: Handicapped height ?Bathroom Accessibility: Yes ?  ?Home Equipment: Air cabin crew (4 wheels) ?  ?  ?  ? ?  ?Prior Functioning/Environment Prior Level of Function : Independent/Modified  Independent;Driving ?  ?  ?  ?  ?  ?  ?Mobility Comments: uses RW occasionally in community - doesn't use in home, furniture walks in home ?ADLs Comments: independent, uses shower seat in shower but reports increasingly difficult ?  ? ?  ?  ?OT Problem List: Decreased strength;Decreased range of motion;Decreased activity tolerance;Impaired balance (sitting and/or standing);Pain;Impaired UE functional use;Decreased knowledge of use of DME or AE ?  ?   ?OT Treatment/Interventions: Self-care/ADL training;Therapeutic exercise;DME and/or AE instruction;Therapeutic activities;Balance training;Patient/family education  ?  ?OT Goals(Current goals can be found in the care plan section) Acute Rehab OT Goals ?Patient Stated Goal: feel stronger ?OT Goal Formulation: With patient ?Time For Goal Achievement: 04/26/22 ?Potential to Achieve Goals: Good  ?OT Frequency: Min 2X/week ?  ? ?Co-evaluation   ?  ?  ?  ?  ? ?  ?AM-PAC OT "6 Clicks" Daily Activity     ?Outcome Measure Help from another person eating meals?: None ?Help from another person taking care of personal grooming?: A Little ?Help from another person toileting, which includes using toliet, bedpan, or urinal?: A Little ?Help from another person bathing (including washing, rinsing, drying)?: A Little ?Help from another person to put on and taking off regular upper body clothing?: A Little ?Help from another person to put on and taking off  regular lower body clothing?: A Little ?6 Click Score: 19 ?  ?End of Session Equipment Utilized During Treatment: Oxygen ?Nurse Communication: Mobility status ? ?Activity Tolerance: Patient limited by fatigue ?Patient left: in chair;with family/visitor present;with nursing/sitter in room ? ?OT Visit Diagnosis: Muscle weakness (generalized) (M62.81)  ?              ?Time: 0932-6712 ?OT Time Calculation (min): 28 min ?Charges:  OT General Charges ?$OT Visit: 1 Visit ?OT Evaluation ?$OT Eval Low Complexity: 1 Low ?OT  Treatments ?$Therapeutic Activity: 8-22 mins ? ?Bocephus Cali, OTR/L ?Acute Care Rehab Services  ?Office [phone] ?Pager: [phone]  ? ?[name] ?04/12/2022, 11:53 AM ?

## 2022-04-12 NOTE — Assessment & Plan Note (Addendum)
Continue home statin but reduce dose. ?

## 2022-04-12 NOTE — Progress Notes (Signed)
?PROGRESS NOTE ? ?[name] ZRA:[id number] DOB: 11/21/1956  ? ?PCP: [name], [name], MD ? ?Patient is from: Home. ? ?DOA: 04/11/2022 LOS: 1 ? ?Chief complaints ?Chief Complaint  ?Patient presents with  ? Weakness  ? Fatigue  ?  ? 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  CBC significant for pancytopenia with WBC of 2.6, Hgb of 7.8 and platelet of 73.  Cardiac markers including BNP and troponin negative.  CTA chest negative for PE but decreased linear opacities of RLL and RML, severe narrowing of proximal RLL and RML bronchi concerning for metastatic adenopathy superimposed on sarcoidosis and unchanged bilateral solid pulmonary nodules similar to her recent CT chest on 3/28.  Pulmonology and palliative medicine consulted.  Oncology notified as well. ?  ? ?Subjective: ?Seen and examined earlier this morning.  No major events overnight of this morning.  Continues to endorse dry cough.  Shortness of breath better.  Still fatigued and tired.  Denies chest pain, abdominal pain, nausea or vomiting.  Denies UTI symptoms.  Denies melena or hematochezia.  Patient's brother and sister-in-law at bedside.  Patient and sister available over the phone. ? ?Objective: ?Vitals:  ? 04/12/22 0443 04/12/22 0847 04/12/22 0850 04/12/22 1212  ?BP: 114/83   112/72  ?Pulse: 83   96  ?Resp: '20  15 18  '$ ?Temp: 98 ?F (36.7 ?C)   97.8 ?F (36.6 ?C)  ?TempSrc: Oral   Oral  ?SpO2: 96% 96% 97% 100%  ?Weight:      ?Height:      ? ? ?Examination: ? ?GENERAL: Appears frail.  Nontoxic. ?HEENT: MMM.  Vision and hearing grossly intact.  ?NECK: Supple.  No apparent  JVD.  ?RESP:  No IWOB.  Diminished aeration over RLL. ?CVS:  RRR. Heart sounds normal.  ?ABD/GI/GU: BS+. Abd soft, NTND.  ?MSK/EXT:  Moves extremities. No apparent deformity.  Trace BLE edema. ?SKIN: no apparent skin lesion or wound ?NEURO: Awake, alert and oriented appropriately.  No apparent focal neuro deficit. ?PSYCH: Calm. Normal affect.  ? ?Procedures:  ?None ? ?Microbiology summarized: ?COVID-19 and influenza PCR nonreactive. ? ?Assessment and Plan: ?* Shortness of breath ?Multifactorial including metastasis from endometrial cancer and pulmonary sarcoidosis with mass effect, anemia and chemotherapy.  PE excluded by CTA chest.  Cardiac markers negative.  No documented desaturation but she has been on 2 to 3 L by nasal cannula. ?-Transfuse 1 unit ?-Continue home prednisone for pulmonary sarcoidosis ?-Breathing treatments ?-Appreciate input by pulmonology, oncology and palliative medicine ? ?Generalized weakness ?Multifactorial including malignancy, chemotherapy, poor p.o. 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  TSH within normal.  Cardiac enzymes within normal.  Low suspicion for infectious process ?-Treat treatable causes  ?-Check CK ?-PT/OT ?-Agree with palliative care input ? ?Goals of care, counseling/discussion ?Extensive discussion about goal of care particularly CODE STATUS with patient, patient's sister over the phone, and brother and sister-in-law at bedside.  We discussed about pros and cons of CPR and intubation.  Given her advanced stage disease, I recommended DNR and DNI.  Patient and family voiced understanding but wanted to wait on palliative care input before making decision. ?-Palliative care consulted on admission. ? ?Pulmonary sarcoidosis ([place]) ?Continue prednisone 20 mg  daily per pulmonology ? ?Endometrial cancer ([place]) ?Defer to oncology. ? ?Essential hypertension ?Normotensive. ?-Continue home bisoprolol ? ?Elevated serum creatinine ?Recent Labs  ?  02/18/22 ?2706 03/05/22 ?1141  03/09/22 ?2120 03/10/22 ?2376 03/11/22 ?0359 03/12/22 ?0357 03/26/22 ?1045 04/02/22 ?1334 04/11/22 ?1213 04/12/22 ?0426  ?BUN '19 12 17 15 16 16 '$ 28* 23 20 27*  ?CREATININE 1.06* 0.77 0.88 0.79 0.82 0.82 0.98 0.88 0.83 1.01*  ?Slightly elevated creatinine compared to baseline but does not meet criteria for AKI. ?-Continue monitoring ? ? ?Hypomagnesemia ?Mg 1.5. ?-IV magnesium sulfate 2 g x 1 ? ?Hypothyroidism ?TSH within normal.  Continue home Synthroid. ? ?Pancytopenia ([place]) ?Likely from chemotherapy.  Leukocytosis and thrombocytopenia improving but anemia worse. ?-Transfuse 1 unit.  Verbally consented. ? ?Hyperlipidemia, mixed ?Continue home statin but reduce dose. ?Check CK ? ?GERD (gastroesophageal reflux disease) ?Continue PPI ? ? ? ?DVT prophylaxis:  ?Place and maintain sequential compression device Start: 04/11/22 1738 ? ?Code Status: DNR/DNI ?Family Communication: Updated patient's brother and sister-in-law at bedside and patient's sister over the phone ?Level of care: Telemetry ?Status is: Inpatient ?Remains inpatient appropriate because: Symptomatic anemia, shortness of breath due to postobstructive process ? ? ?Final disposition: TBD ?Consultants:  ?Pulmonology ?Oncology ?Palliative medicine ? ?Sch Meds:  ?Scheduled Meds: ? sodium chloride   Intravenous Once  ? [START ON 04/13/2022] amphetamine-dextroamphetamine  10 mg Oral Q breakfast  ? [START ON 04/13/2022] atorvastatin  40 mg Oral q morning  ? bisoprolol  10 mg Oral Daily  ? Chlorhexidine Gluconate Cloth  6 each Topical Daily  ? feeding supplement  237 mL Oral BID BM  ? ipratropium-albuterol  3 mL Nebulization TID  ? levothyroxine  100 mcg Oral QAC breakfast  ? loratadine  10 mg Oral Daily  ? mometasone-formoterol  2 puff Inhalation BID  ? morphine CONCENTRATE  5 mg Oral BID  ? multivitamin with minerals  1 tablet Oral Daily  ? pantoprazole  40 mg Oral QHS  ? polyethylene glycol  17 g Oral BID  ? predniSONE  20 mg Oral Q breakfast  ? senna-docusate  1  tablet Oral BID  ? sodium chloride flush  10-40 mL Intracatheter Q12H  ? ?Continuous Infusions: ? magnesium sulfate bolus IVPB    ? ?PRN Meds:.acetaminophen **OR** acetaminophen, albuterol, guaiFENesin, oxyCODONE, sodium chloride flush ? ?Antimicrobials: ?Anti-infectives (From admission, onward)  ? ? None  ? ?  ? ? ? ?I have personally reviewed the following labs and images: ?CBC: ?Recent Labs  ?Lab 04/11/22 ?1213 04/12/22 ?0426  ?WBC 2.6* 3.2*  ?HGB 7.8* 7.3*  ?HCT 22.0* 20.3*  ?MCV 99.1 100.5*  ?PLT 73* 78*  ? ?BMP &GFR ?Recent Labs  ?Lab 04/11/22 ?1213 04/12/22 ?0426  ?NA 139 137  ?K 3.5 4.2  ?CL 103 105  ?CO2 27 26  ?GLUCOSE 126* 200*  ?BUN 20 27*  ?CREATININE 0.83 1.01*  ?CALCIUM 8.4* 8.2*  ?MG  --  1.5*  ? ?Estimated Creatinine Clearance: 61.2 mL/min (A) (by C-G formula based on SCr of 1.01 mg/dL (H)). ?Liver & Pancreas: ?Recent Labs  ?Lab 04/11/22 ?1213 04/12/22 ?0426  ?AST 19 18  ?ALT 21 21  ?ALKPHOS 87 82  ?BILITOT 1.3* 0.9  ?PROT 7.0 6.8  ?ALBUMIN 3.4* 3.1*  ? ?No results for input(s): LIPASE, AMYLASE in the last 168 hours. ?No results for input(s): AMMONIA in the last 168 hours. ?Diabetic: ?No results for input(s): HGBA1C in the last 72 hours. ?Recent Labs  ?Lab 04/11/22 ?1141  ?GLUCAP 135*  ? ?Cardiac  Enzymes: ?No results for input(s): CKTOTAL, CKMB, CKMBINDEX, TROPONINI in the last 168 hours. ?No results for input(s): PROBNP in the last 8760 hours. ?Coagulation Profile: ?No results for input(s): INR, PROTIME in the last 168 hours. ?Thyroid Function Tests: ?Recent Labs  ?  04/11/22 ?1213  ?TSH 1.544  ?FREET4 1.34*  ? ?Lipid Profile: ?No results for input(s): CHOL, HDL, LDLCALC, TRIG, CHOLHDL, LDLDIRECT in the last 72 hours. ?Anemia Panel: ?Recent Labs  ?  04/11/22 ?1500  ?TIBC 242*  ?IRON 60  ? ?Urine analysis: ?   ?Component Value Date/Time  ? COLORURINE YELLOW 02/17/2022 1545  ? APPEARANCEUR CLEAR 02/17/2022 1545  ? LABSPEC 1.015 02/17/2022 1545  ? PHURINE 6.0 02/17/2022 1545  ? GLUCOSEU NEGATIVE  02/17/2022 1545  ? HGBUR NEGATIVE 02/17/2022 1545  ? [place] NEGATIVE 02/17/2022 1545  ? BILIRUBINUR n 06/27/2015 1107  ? [place] NEGATIVE 02/17/2022 1545  ? PROTEINUR NEGATIVE 02/17/2022 1545  ? UROBILINOGEN

## 2022-04-12 NOTE — Assessment & Plan Note (Addendum)
Now DNR/DNI.  Decided to stop chemotherapy and go home with home hospice. ?-As needed oxycodone, Roxanol and Adderall ?-Home oxygen, inhalers and nebs ?-TOC assisted with hospice referral ?-Hospice to arrange DME ?

## 2022-04-13 DIAGNOSIS — E43 Unspecified severe protein-calorie malnutrition: Secondary | ICD-10-CM

## 2022-04-13 DIAGNOSIS — D860 Sarcoidosis of lung: Secondary | ICD-10-CM | POA: Diagnosis not present

## 2022-04-13 DIAGNOSIS — R0602 Shortness of breath: Secondary | ICD-10-CM | POA: Diagnosis not present

## 2022-04-13 DIAGNOSIS — R531 Weakness: Secondary | ICD-10-CM | POA: Diagnosis not present

## 2022-04-13 DIAGNOSIS — R0609 Other forms of dyspnea: Secondary | ICD-10-CM

## 2022-04-13 DIAGNOSIS — Z7189 Other specified counseling: Secondary | ICD-10-CM | POA: Diagnosis not present

## 2022-04-13 LAB — TYPE AND SCREEN
ABO/RH(D): A POS
Antibody Screen: NEGATIVE
Unit division: 0

## 2022-04-13 LAB — BPAM RBC
Blood Product Expiration Date: 202305202359
ISSUE DATE / TIME: 202304281546
Unit Type and Rh: 6200

## 2022-04-13 LAB — RENAL FUNCTION PANEL
Albumin: 3.1 g/dL — ABNORMAL LOW (ref 3.5–5.0)
Anion gap: 9 (ref 5–15)
BUN: 20 mg/dL (ref 8–23)
CO2: 27 mmol/L (ref 22–32)
Calcium: 8.4 mg/dL — ABNORMAL LOW (ref 8.9–10.3)
Chloride: 103 mmol/L (ref 98–111)
Creatinine, Ser: 0.73 mg/dL (ref 0.44–1.00)
GFR, Estimated: >60 mL/min (ref >60)
Glucose, Bld: 130 mg/dL — ABNORMAL HIGH (ref 70–99)
Phosphorus: 3 mg/dL (ref 2.5–4.6)
Potassium: 4 mmol/L (ref 3.5–5.1)
Sodium: 139 mmol/L (ref 135–145)

## 2022-04-13 LAB — CBC
HCT: 26.4 % — ABNORMAL LOW (ref 36.0–46.0)
Hemoglobin: 9.1 g/dL — ABNORMAL LOW (ref 12.0–15.0)
MCH: 34.2 pg — ABNORMAL HIGH (ref 26.0–34.0)
MCHC: 34.5 g/dL (ref 30.0–36.0)
MCV: 99.2 fL (ref 80.0–100.0)
Platelets: 74 10*3/uL — ABNORMAL LOW (ref 150–400)
RBC: 2.66 MIL/uL — ABNORMAL LOW (ref 3.87–5.11)
RDW: 19 % — ABNORMAL HIGH (ref 11.5–15.5)
WBC: 2.8 10*3/uL — ABNORMAL LOW (ref 4.0–10.5)
nRBC: 3.2 % — ABNORMAL HIGH (ref 0.0–0.2)

## 2022-04-13 LAB — MAGNESIUM: Magnesium: 2.1 mg/dL (ref 1.7–2.4)

## 2022-04-13 NOTE — Assessment & Plan Note (Signed)
Nutrition Status: ?Nutrition Problem: Severe Malnutrition ?Etiology: chronic illness (endometrial cancer) ?Signs/Symptoms: energy intake < or equal to 75% for > or equal to 1 month, percent weight loss ?Percent weight loss: 16 % ?Interventions: Ensure Enlive (each supplement provides 350kcal and 20 grams of protein), MVI, Liberalize Diet ?

## 2022-04-13 NOTE — Plan of Care (Signed)

## 2022-04-13 NOTE — Progress Notes (Signed)
?PROGRESS NOTE ? ?[name] NGE:[id number] DOB: 15-Nov-1956  ? ?PCP: [name], [name], MD ? ?Patient is from: Home. ? ?DOA: 04/11/2022 LOS: 2 ? ?Chief complaints ?Chief Complaint  ?Patient presents with  ? Weakness  ? Fatigue  ?  ? 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  CBC significant for pancytopenia with WBC of 2.6, Hgb of 7.8 and platelet of 73.  Cardiac markers including BNP and troponin negative.  CTA chest negative for PE but decreased linear opacities of RLL and RML, severe narrowing of proximal RLL and RML bronchi concerning for metastatic adenopathy superimposed on sarcoidosis and unchanged bilateral solid pulmonary nodules similar to her recent CT chest on 3/28.  Pulmonology and palliative medicine consulted.   ? ?Hemoglobin reached nadir at 7.3.  Transfused 1 unit with appropriate response and improvement in his symptoms.  Palliative medicine started Roxanol and Adderall.  CODE STATUS changed to DNR/DNI. ? ? ?  ? ?Subjective: ?Seen and examined earlier this morning.  No major events overnight of this morning.  She reports improvement in her breathing and fatigue after blood transfusion but anxious to go home today.  Still with dry cough.  She denies chest pain.  ? ?Objective: ?Vitals:  ? 04/13/22 0400 04/13/22 0916 04/13/22 0919 04/13/22 1221  ?BP: (!) 150/91   127/73  ?Pulse: 85   90  ?Resp: 20   16  ?Temp: 97.9 ?F (36.6 ?C)   98.1 ?F (36.7 ?C)  ?TempSrc: Oral   Oral  ?SpO2: 98% 98% 98% 99%  ?Weight:      ?Height:      ? ? ?Examination: ? ?GENERAL: Sitting on bedside chair. ?HEENT: MMM.  Vision and hearing  grossly intact.  ?NECK: Supple.  No apparent JVD.  ?RESP:  No IWOB.  Diminished aeration over RLL. ?CVS:  RRR. Heart sounds normal.  ?ABD/GI/GU: BS+. Abd soft, NTND.  ?MSK/EXT:  Moves extremities. No apparent deformity.  Trace BLE edema. ?SKIN: no apparent skin lesion or wound ?NEURO: Awake and alert. Oriented appropriately.  No apparent focal neuro deficit. ?PSYCH: Calm. Normal affect.  ? ?Procedures:  ?None ? ?Microbiology summarized: ?COVID-19 and influenza PCR nonreactive. ? ?Assessment and Plan: ?* Dyspnea on minimal exertion ?Multifactorial including metastasis from endometrial cancer and pulmonary sarcoidosis with mass effect, anemia and chemotherapy.  PE excluded by CTA chest.  Cardiac markers negative.  Maintain saturation at 97% with ambulation on RA but very dyspneic.  Reports improvement after blood transfusion today. ?-Continue home prednisone for pulmonary sarcoidosis ?-Breathing treatments ?-Appreciate input by pulmonology, oncology and palliative medicine ?-Palliative medicine started Roxanol ?-Encourage incentive spirometry ? ?Generalized weakness ?Multifactorial including malignancy, chemotherapy, poor p.o. 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  TSH and CK within normal.  Cardiac enzymes within normal.  Low suspicion for infectious process.  ?-PT/OT-recommended [employer] PT/OT. ?-Appreciate input by palliative care and PCCM ?-Palliative started Adderall ? ?Protein-calorie malnutrition, severe ?Nutrition Status: ?Nutrition Problem: Severe Malnutrition ?Etiology: chronic illness (endometrial cancer) ?Signs/Symptoms: energy intake < or equal to 75% for > or equal to 1 month, percent weight loss ?Percent weight loss: 16 % ?Interventions: Ensure Enlive (each supplement provides 350kcal and 20 grams of protein), MVI, Liberalize Diet ? ?Pancytopenia ([place]) ?  Likely from chemotherapy.  Hemoglobin improved after 1 unit.  Leukopenia and thrombocytopenia stable. ?-Recheck in the morning ? ?Goals of care,  counseling/discussion ?Now DNR/DNI. ?Appreciate help by palliative care medicine ? ?Pulmonary sarcoidosis ([place]) ?Continue prednisone 20 mg daily per pulmonology ? ?Endometrial cancer ([place]) ?Defer to oncology. ? ?Essential hypertension ?Normotensive. ?-Continue home bisoprolol ? ?Elevated serum creatinine ?Resolved. ? ? ?Hypomagnesemia ?Resolved. ? ?Hypothyroidism ?TSH within normal.  Continue home Synthroid. ? ?Hyperlipidemia, mixed ?Continue home statin but reduce dose. ? ?GERD (gastroesophageal reflux disease) ?Continue PPI ? ? ? ?DVT prophylaxis:  ?Place and maintain sequential compression device Start: 04/11/22 1738 ? ?Code Status: DNR/DNI ?Family Communication: None at bedside today. ? ?Level of care: Med-Surg ?Status is: Inpatient ?Remains inpatient appropriate because: Symptomatic anemia, dyspnea on exertion and fatigue ? ? ?Final disposition: Likely home with home health on 4/30. ?Consultants:  ?Pulmonology ?Oncology ?Palliative medicine ? ?Sch Meds:  ?Scheduled Meds: ? amphetamine-dextroamphetamine  10 mg Oral Q breakfast  ? atorvastatin  40 mg Oral q morning  ? bisoprolol  10 mg Oral Daily  ? Chlorhexidine Gluconate Cloth  6 each Topical Daily  ? feeding supplement  237 mL Oral BID BM  ? ipratropium-albuterol  3 mL Nebulization TID  ? levothyroxine  100 mcg Oral QAC breakfast  ? loratadine  10 mg Oral Daily  ? mometasone-formoterol  2 puff Inhalation BID  ? morphine CONCENTRATE  5 mg Oral BID  ? multivitamin with minerals  1 tablet Oral Daily  ? pantoprazole  40 mg Oral QHS  ? polyethylene glycol  17 g Oral BID  ? predniSONE  20 mg Oral Q breakfast  ? senna-docusate  1 tablet Oral BID  ? sodium chloride flush  10-40 mL Intracatheter Q12H  ? ?Continuous Infusions: ? ? ?PRN Meds:.acetaminophen **OR** acetaminophen, albuterol, guaiFENesin, oxyCODONE, sodium chloride flush ? ?Antimicrobials: ?Anti-infectives (From admission, onward)  ? ? None  ? ?  ? ? ? ?I have personally reviewed the following labs and  images: ?CBC: ?Recent Labs  ?Lab 04/11/22 ?1213 04/12/22 ?0426 04/12/22 ?1929 04/13/22 ?0410  ?WBC 2.6* 3.2*  --  2.8*  ?HGB 7.8* 7.3* 8.7* 9.1*  ?HCT 22.0* 20.3* 24.5* 26.4*  ?MCV 99.1 100.5*  --  99.2  ?PLT 73* 78*  --  74*  ? ?BMP &GFR ?Recent Labs  ?Lab 04/11/22 ?1213 04/12/22 ?0426 04/13/22 ?0410  ?NA 139 137 139  ?K 3.5 4.2 4.0  ?CL 103 105 103  ?CO2 '27 26 27  '$ ?GLUCOSE 126* 200* 130*  ?BUN 20 27* 20  ?CREATININE 0.83 1.01* 0.73  ?CALCIUM 8.4* 8.2* 8.4*  ?MG  --  1.5* 2.1  ?PHOS  --   --  3.0  ? ?Estimated Creatinine Clearance: 77.3 mL/min (by C-G formula based on SCr of 0.73 mg/dL). ?Liver & Pancreas: ?Recent Labs  ?Lab 04/11/22 ?1213 04/12/22 ?0426 04/13/22 ?0410  ?AST 19 18  --   ?ALT 21 21  --   ?ALKPHOS 87 82  --   ?BILITOT 1.3* 0.9  --   ?PROT 7.0 6.8  --   ?ALBUMIN 3.4* 3.1* 3.1*  ? ?No results for input(s): LIPASE, AMYLASE in the last 168 hours. ?No results for input(s): AMMONIA in the last 168 hours. ?Diabetic: ?No results for input(s): HGBA1C in the last 72 hours. ?Recent Labs  ?Lab 04/11/22 ?1141  ?GLUCAP 135*  ? ?Cardiac Enzymes: ?Recent Labs  ?Lab 04/12/22 ?0426  ?CKTOTAL 53  ? ?No results for input(s): PROBNP in the last 8760 hours. ?Coagulation Profile: ?  No results for input(s): INR, PROTIME in the last 168 hours. ?Thyroid Function Tests: ?Recent Labs  ?  04/11/22 ?1213  ?TSH 1.544  ?FREET4 1.34*  ? ?Lipid Profile: ?No results for input(s): CHOL, HDL, LDLCALC, TRIG, CHOLHDL, LDLDIRECT in the last 72 hours. ?Anemia Panel: ?Recent Labs  ?  04/11/22 ?1500  ?TIBC 242*  ?IRON 60  ? ?Urine analysis: ?   ?Component Value Date/Time  ? COLORURINE YELLOW 02/17/2022 1545  ? APPEARANCEUR CLEAR 02/17/2022 1545  ? LABSPEC 1.015 02/17/2022 1545  ? PHURINE 6.0 02/17/2022 1545  ? GLUCOSEU NEGATIVE 02/17/2022 1545  ? HGBUR NEGATIVE 02/17/2022 1545  ? [place] NEGATIVE 02/17/2022 1545  ? BILIRUBINUR n 06/27/2015 1107  ? [place] NEGATIVE 02/17/2022 1545  ? PROTEINUR NEGATIVE 02/17/2022 1545  ? UROBILINOGEN 0.2  06/27/2015 1107  ? UROBILINOGEN 0.2 02/08/2014 0920  ? N[place]RITE NEGATIVE 02/17/2022 1545  ? LEUKOCYTESUR SMALL (A) 02/17/2022 1545  ? ?Sepsis Labs: ?Invalid input(s): PROCALCITONIN, LACTICIDVEN ? ?Microbiology: ?Recent

## 2022-04-13 NOTE — Progress Notes (Signed)
Pt reports feeling better now and is resting in bed. Pt states her chest pressure has resolved and currently has no complaints.  ?

## 2022-04-13 NOTE — Progress Notes (Signed)
Pt c/o sudden onset midsternal pressure upon completion of her breathing treatment. Pt denies any other symptoms but states she is unable to lay down and is sitting on the edge of the bed. Pt states it feels like she has indigestion. Pt HR briefly elevated upons assessment and pt appears uncomfortable. Left lung noted to have crackles and Right lung sounds are diminished. VS rechecked and stable. Pt given scheduled morphine, senakot, miralax and protonix and PRN robitussin per order. Pt encourage to deep breath and cough and use IS. Hollace Hayward, NP notified through epic secure chat of pt current condition.  ?

## 2022-04-14 DIAGNOSIS — R0602 Shortness of breath: Secondary | ICD-10-CM | POA: Diagnosis not present

## 2022-04-14 DIAGNOSIS — Z7189 Other specified counseling: Secondary | ICD-10-CM | POA: Diagnosis not present

## 2022-04-14 DIAGNOSIS — R0789 Other chest pain: Secondary | ICD-10-CM

## 2022-04-14 DIAGNOSIS — R531 Weakness: Secondary | ICD-10-CM | POA: Diagnosis not present

## 2022-04-14 DIAGNOSIS — R0609 Other forms of dyspnea: Secondary | ICD-10-CM | POA: Diagnosis not present

## 2022-04-14 LAB — CBC
HCT: 26.3 % — ABNORMAL LOW (ref 36.0–46.0)
Hemoglobin: 9.3 g/dL — ABNORMAL LOW (ref 12.0–15.0)
MCH: 35.2 pg — ABNORMAL HIGH (ref 26.0–34.0)
MCHC: 35.4 g/dL (ref 30.0–36.0)
MCV: 99.6 fL (ref 80.0–100.0)
Platelets: 77 10*3/uL — ABNORMAL LOW (ref 150–400)
RBC: 2.64 MIL/uL — ABNORMAL LOW (ref 3.87–5.11)
RDW: 19.7 % — ABNORMAL HIGH (ref 11.5–15.5)
WBC: 3.1 10*3/uL — ABNORMAL LOW (ref 4.0–10.5)
nRBC: 2.3 % — ABNORMAL HIGH (ref 0.0–0.2)

## 2022-04-14 LAB — TROPONIN I (HIGH SENSITIVITY)
Troponin I (High Sensitivity): 6 ng/L (ref <18)
Troponin I (High Sensitivity): 6 ng/L (ref <18)

## 2022-04-14 MED ORDER — PANTOPRAZOLE SODIUM 40 MG PO TBEC
40.0000 mg | DELAYED_RELEASE_TABLET | Freq: Two times a day (BID) | ORAL | Status: DC
  Administered 2022-04-14 – 2022-04-15 (×2): 40 mg via ORAL
  Filled 2022-04-14 (×2): qty 1

## 2022-04-14 MED ORDER — ALUM & MAG HYDROXIDE-SIMETH 200-200-20 MG/5ML PO SUSP
30.0000 mL | Freq: Three times a day (TID) | ORAL | Status: DC | PRN
Start: 2022-04-14 — End: 2022-04-15

## 2022-04-14 MED ORDER — BUDESONIDE 0.5 MG/2ML IN SUSP
0.5000 mg | Freq: Two times a day (BID) | RESPIRATORY_TRACT | Status: DC
  Administered 2022-04-14 – 2022-04-15 (×2): 0.5 mg via RESPIRATORY_TRACT
  Filled 2022-04-14 (×2): qty 2

## 2022-04-14 MED ORDER — GUAIFENESIN-DM 100-10 MG/5ML PO SYRP: 5.0000 mL | ORAL_SOLUTION | ORAL | Status: DC | PRN

## 2022-04-14 MED ORDER — ARFORMOTEROL TARTRATE 15 MCG/2ML IN NEBU
15.0000 ug | INHALATION_SOLUTION | Freq: Two times a day (BID) | RESPIRATORY_TRACT | Status: DC
  Administered 2022-04-14 – 2022-04-15 (×2): 15 ug via RESPIRATORY_TRACT
  Filled 2022-04-14 (×2): qty 2

## 2022-04-14 NOTE — Assessment & Plan Note (Addendum)
Patient had substernal chest pain that she described as indigestion overnight.  EKG and serial troponin negative.  Pain resolved after IV morphine.  She she has some indigestion after her breakfast this morning.  She is already on Protonix 40 mg daily.  ?-Increase Protonix to twice daily while she is on steroid ?

## 2022-04-14 NOTE — Plan of Care (Signed)
?  Problem: Education: Goal: Knowledge of General Education information will improve Description: Including pain rating scale, medication(s)/side effects and non-pharmacologic comfort measures Outcome: Completed/Met   Problem: Activity: Goal: Risk for activity intolerance will decrease Outcome: Completed/Met   

## 2022-04-14 NOTE — Progress Notes (Signed)
?PROGRESS NOTE ? ?[name] VPX:[id number] DOB: March 26, 1956  ? ?PCP: [name], [name], MD ? ?Patient is from: Home. ? ?DOA: 04/11/2022 LOS: 3 ? ?Chief complaints ?Chief Complaint  ?Patient presents with  ? Weakness  ? Fatigue  ?  ? 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  CBC significant for pancytopenia with WBC of 2.6, Hgb of 7.8 and platelet of 73.  Cardiac markers including BNP and troponin negative.  CTA chest negative for PE but decreased linear opacities of RLL and RML, severe narrowing of proximal RLL and RML bronchi concerning for metastatic adenopathy superimposed on sarcoidosis and unchanged bilateral solid pulmonary nodules similar to her recent CT chest on 3/28.  Pulmonology and palliative medicine consulted.   ? ?Hemoglobin reached nadir at 7.3.  Transfused 1 unit with appropriate response and improvement in his symptoms.  Palliative medicine started Roxanol and Adderall.  CODE STATUS changed to DNR/DNI. ? ? ? ?Subjective: ?Seen and examined earlier this morning.  She had an episode of substernal chest pain that she described as indigestion overnight.  Hip pain resolved after IV morphine.  Serial troponin and EKG was negative.  She felt a little bit of indigestion after breakfast this morning as well.  She is already on Protonix.  She feels better from breathing and fatigue standpoint. ? ?Objective: ?Vitals:  ? 04/14/22 0120 04/14/22 4627 04/14/22 0920 04/14/22 0350  ?BP: (!) 142/82 (!) 144/81    ?Pulse: 84 87    ?Resp: 18     ?Temp: 97.7 ?F (36.5 ?C)     ?TempSrc: Oral     ?SpO2: 95% 98% 98% 98%  ?Weight:       ?Height:      ? ? ?Examination: ? ?GENERAL: No apparent distress.  Nontoxic. ?HEENT: MMM.  Vision and hearing grossly intact.  ?NECK: Supple.  No apparent JVD.  ?RESP:  No IWOB.  Fair aeration bilaterally. ?CVS:  RRR. Heart sounds normal.  ?ABD/GI/GU: BS+. Abd soft, NTND.  ?MSK/EXT:  Moves extremities. No apparent deformity. No edema.  ?SKIN: no apparent skin lesion or wound ?NEURO: Awake and alert. Oriented appropriately.  No apparent focal neuro deficit. ?PSYCH: Calm. Normal affect.  ? ?Procedures:  ?None ? ?Microbiology summarized: ?COVID-19 and influenza PCR nonreactive. ? ?Assessment and Plan: ?* Dyspnea on minimal exertion ?Multifactorial including metastasis from endometrial cancer and pulmonary sarcoidosis with mass effect, anemia and chemotherapy.  PE excluded by CTA chest.  Cardiac markers negative.  Maintain saturation at 97% with ambulation on RA but very dyspneic.  Symptoms improved after blood transfusion. ?-Continue home prednisone for pulmonary sarcoidosis ?-Change inhaler to nebulizers.  Changed Mucinex to Mucinex DM ?-Appreciate input by pulmonology and oncology. ?-Palliative medicine started Roxanol ?-Encourage incentive spirometry ? ?Generalized weakness ?Multifactorial including malignancy, chemotherapy, poor p.o. 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  TSH and CK within normal.  Cardiac enzymes within normal.  Low suspicion for infectious process.  ?-PT/OT-recommended [facility] PT/OT. ?-Palliative started Adderall ? ?Atypical chest pain ?Patient had substernal chest pain that she described as indigestion overnight.  EKG and serial troponin negative.  Pain resolved after IV morphine.  She she has some indigestion after her breakfast this morning.  She is already  on Protonix 40 mg daily.  ?-Increase Protonix to twice daily while she is on steroid ?-Add a GI cocktail ? ?Protein-calorie malnutrition, severe ?Nutrition Status: ?Nutrition Problem: Severe Malnutrition ?Etiology: chronic illness  (endometrial cancer) ?Signs/Symptoms: energy intake < or equal to 75% for > or equal to 1 month, percent weight loss ?Percent weight loss: 16 % ?Interventions: Ensure Enlive (each supplement provides 350kcal and 20 grams of protein), MVI, Liberalize Diet ? ?Pancytopenia ([place]) ?Likely from chemotherapy.  Hemoglobin improved after 1 unit.  Leukopenia and thrombocytopenia stable. ?-Recheck in the morning ? ?Goals of care, counseling/discussion ?Now DNR/DNI.  Started on Adderall and Roxanol ?Wait on final recommendation by PMD on 5/1 before discharge ? ?Pulmonary sarcoidosis ([place]) ?Continue prednisone 20 mg daily per pulmonology ? ?Endometrial cancer ([place]) ?On chemotherapy. ?Per oncology ? ?Essential hypertension ?Normotensive. ?-Continue home bisoprolol ? ?Elevated serum creatinine ?Resolved. ? ? ?Hypomagnesemia ?Resolved. ? ?Hypothyroidism ?TSH within normal.  Continue home Synthroid. ? ?Hyperlipidemia, mixed ?Continue home statin but reduce dose. ? ?GERD (gastroesophageal reflux disease) ?Continue PPI ?Added GI cocktail ? ? ? ?DVT prophylaxis:  ?Place and maintain sequential compression device Start: 04/11/22 1738 ? ?Code Status: DNR/DNI ?Family Communication: Updated patient's sister over the phone from bedside. ? ?Level of care: Med-Surg ?Status is: Inpatient ?Remains inpatient appropriate because: Symptomatic anemia, dyspnea on exertion and fatigue ? ? ?Final disposition: Likely home with home health on 5/1 ?Consultants:  ?Pulmonology ?Oncology ?Palliative medicine ? ?Sch Meds:  ?Scheduled Meds: ? amphetamine-dextroamphetamine  10 mg Oral Q breakfast  ? arformoterol  15 mcg Nebulization BID  ? atorvastatin  40 mg Oral q morning  ? bisoprolol  10 mg Oral Daily  ? budesonide (PULMICORT) nebulizer solution  0.5 mg Nebulization BID  ? Chlorhexidine Gluconate Cloth  6 each Topical Daily  ? feeding supplement  237 mL Oral BID BM  ? ipratropium-albuterol  3 mL Nebulization TID  ? levothyroxine  100 mcg Oral QAC breakfast   ? loratadine  10 mg Oral Daily  ? morphine CONCENTRATE  5 mg Oral BID  ? multivitamin with minerals  1 tablet Oral Daily  ? pantoprazole  40 mg Oral BID  ? polyethylene glycol  17 g Oral BID  ? predniSONE  20 mg Oral Q breakfast  ? senna-docusate  1 tablet Oral BID  ? sodium chloride flush  10-40 mL Intracatheter Q12H  ? ?Continuous Infusions: ? ? ?PRN Meds:.acetaminophen **OR** acetaminophen, albuterol, alum & mag hydroxide-simeth, guaiFENesin-dextromethorphan, oxyCODONE, sodium chloride flush ? ?Antimicrobials: ?Anti-infectives (From admission, onward)  ? ? None  ? ?  ? ? ? ?I have personally reviewed the following labs and images: ?CBC: ?Recent Labs  ?Lab 04/11/22 ?1213 04/12/22 ?0426 04/12/22 ?1929 04/13/22 ?0410 04/14/22 ?0101  ?WBC 2.6* 3.2*  --  2.8* 3.1*  ?HGB 7.8* 7.3* 8.7* 9.1* 9.3*  ?HCT 22.0* 20.3* 24.5* 26.4* 26.3*  ?MCV 99.1 100.5*  --  99.2 99.6  ?PLT 73* 78*  --  74* 77*  ? ?BMP &GFR ?Recent Labs  ?Lab 04/11/22 ?1213 04/12/22 ?0426 04/13/22 ?0410  ?NA 139 137 139  ?K 3.5 4.2 4.0  ?CL 103 105 103  ?CO2 '27 26 27  '$ ?GLUCOSE 126* 200* 130*  ?BUN 20 27* 20  ?CREATININE 0.83 1.01* 0.73  ?CALCIUM 8.4* 8.2* 8.4*  ?MG  --  1.5* 2.1  ?PHOS  --   --  3.0  ? ?Estimated Creatinine Clearance: 77.3 mL/min (by C-G formula based on SCr of 0.73 mg/dL). ?Liver & Pancreas: ?Recent Labs  ?Lab  04/11/22 ?1213 04/12/22 ?0426 04/13/22 ?0410  ?AST 19 18  --   ?ALT 21 21  --   ?ALKPHOS 87 82  --   ?BILITOT 1.3* 0.9  --   ?PROT 7.0 6.8  --   ?ALBUMIN 3.4* 3.1* 3.1*  ? ?No results for input(s): LIPASE, AMYLASE in the last 168 hours. ?No results for input(s): AMMONIA in the last 168 hours. ?Diabetic: ?No results for input(s): HGBA1C in the last 72 hours. ?Recent Labs  ?Lab 04/11/22 ?1141  ?GLUCAP 135*  ? ?Cardiac Enzymes: ?Recent Labs  ?Lab 04/12/22 ?0426  ?CKTOTAL 53  ? ?No results for input(s): PROBNP in the last 8760 hours. ?Coagulation Profile: ?No results for input(s): INR, PROTIME in the last 168 hours. ?Thyroid Function  Tests: ?No results for input(s): TSH, T4TOTAL, FREET4, T3FREE, THYROIDAB in the last 72 hours. ? ?Lipid Profile: ?No results for input(s): CHOL, HDL, LDLCALC, TRIG, CHOLHDL, LDLDIRECT in the last 72 hours. ?[place]ne

## 2022-04-15 ENCOUNTER — Inpatient Hospital Stay: Payer: Medicare HMO | Admitting: Hematology and Oncology

## 2022-04-15 ENCOUNTER — Inpatient Hospital Stay: Payer: Medicare HMO

## 2022-04-15 ENCOUNTER — Other Ambulatory Visit: Payer: Self-pay | Admitting: Student

## 2022-04-15 ENCOUNTER — Other Ambulatory Visit (HOSPITAL_COMMUNITY): Payer: Self-pay

## 2022-04-15 DIAGNOSIS — R0609 Other forms of dyspnea: Secondary | ICD-10-CM | POA: Diagnosis not present

## 2022-04-15 DIAGNOSIS — Z7189 Other specified counseling: Secondary | ICD-10-CM | POA: Diagnosis not present

## 2022-04-15 DIAGNOSIS — R531 Weakness: Secondary | ICD-10-CM | POA: Diagnosis not present

## 2022-04-15 DIAGNOSIS — R0789 Other chest pain: Secondary | ICD-10-CM | POA: Diagnosis not present

## 2022-04-15 MED ORDER — PANTOPRAZOLE SODIUM 40 MG PO TBEC: 40.0000 mg | DELAYED_RELEASE_TABLET | Freq: Two times a day (BID) | ORAL | 1 refills | Status: DC

## 2022-04-15 MED ORDER — PANTOPRAZOLE SODIUM 40 MG PO TBEC
DELAYED_RELEASE_TABLET | ORAL | 1 refills | Status: AC
  Filled 2022-04-15: qty 60, 30d supply, fill #0

## 2022-04-15 MED ORDER — MORPHINE SULFATE (CONCENTRATE) 10 MG/0.5ML PO SOLN: 5.0000 mg | ORAL | 0 refills | Status: AC | PRN

## 2022-04-15 MED ORDER — AMPHETAMINE-DEXTROAMPHETAMINE 10 MG PO TABS
ORAL_TABLET | ORAL | 0 refills | Status: AC
  Filled 2022-04-15: qty 30, 30d supply, fill #0

## 2022-04-15 MED ORDER — ALBUTEROL SULFATE (2.5 MG/3ML) 0.083% IN NEBU
2.5000 mg | INHALATION_SOLUTION | RESPIRATORY_TRACT | 2 refills | Status: AC | PRN
  Filled 2022-04-15: qty 75, 5d supply, fill #0
  Filled 2022-04-25: qty 75, 5d supply, fill #1

## 2022-04-15 MED ORDER — AMPHETAMINE-DEXTROAMPHETAMINE 10 MG PO TABS: 10.0000 mg | ORAL_TABLET | Freq: Every day | ORAL | 0 refills | Status: AC

## 2022-04-15 MED ORDER — IPRATROPIUM-ALBUTEROL 0.5-2.5 (3) MG/3ML IN SOLN
3.0000 mL | RESPIRATORY_TRACT | 1 refills | Status: DC | PRN
  Filled 2022-04-15: qty 360, 20d supply, fill #0

## 2022-04-15 MED ORDER — MAGNESIUM HYDROXIDE 400 MG/5ML PO SUSP
30.0000 mL | Freq: Every day | ORAL | Status: AC
  Administered 2022-04-15: 30 mL via ORAL
  Filled 2022-04-15: qty 30

## 2022-04-15 MED ORDER — PANTOPRAZOLE SODIUM 40 MG PO TBEC: 40.0000 mg | DELAYED_RELEASE_TABLET | Freq: Two times a day (BID) | ORAL | 1 refills | Status: AC

## 2022-04-15 MED ORDER — HEPARIN SOD (PORK) LOCK FLUSH 100 UNIT/ML IV SOLN
500.0000 [IU] | INTRAVENOUS | Status: AC | PRN
  Administered 2022-04-15: 500 [IU]
  Filled 2022-04-15: qty 5

## 2022-04-15 NOTE — Progress Notes (Signed)
Palliative Care ?Family Meeting ? ? ?Met with patient, her daughter, son and sister in the room- brother on the phone. I also spoke with Dr. Alvy Bimler about her cancer plan of care. At this time Dr. Alvy Bimler is not offering treatment but will continue to check in with patient to see how things are going. I shared this information with the patient and her family, I reassured her that we could still treat and reversible conditions that would improve her QOL and functional status. [name] looks so much better today than on Friday-her energy is improved as well as dyspnea. ? ?I discussed a recommendation for hospice services to provide her with a high level of in home support and symptom management. They are agreeable. ? ?Continue BID Roxanol 93m for sydpnea, with q2prn dose, qAM adderall and an aggressive bowel regimen. ? ?Her family will provide 24/7 care at home. ? ?Will need home O2, hospital bed, BSC. ? ?Ok to discharge today. ? ?ELane Hacker DO ?Palliative Medicine ? ? ?Time: 90 minutes ? ?

## 2022-04-15 NOTE — Progress Notes (Signed)
Got word from Dr Lurlean Leyden - that patient going to hospice. CCM will sign off ? ? ? ?SIGNATURE  ? ? ?Dr. Brand Males, M.D., F.C.C.P,  ?Pulmonary and Critical Care Medicine ?Staff Physician, [place] ?Center Director - Interstitial Lung Disease  Program  ?Medical Director - [place] ICU ?P[facility] ?[place], [place], [address] ? ?NPI Number:  NPI #[id number] ?DEA Number: [id number] ? ?Pager: [phone], If no answer  -> Check AMION or Try [phone] ?Telephone (clinical office): [phone] ?Telephone (research): [phone] ? ?1:11 PM ?04/15/2022 ? ?

## 2022-04-15 NOTE — Plan of Care (Signed)
°  Problem: Coping: °Goal: Level of anxiety will decrease °Outcome: Progressing °  °

## 2022-04-15 NOTE — Progress Notes (Signed)
Provided and discussed discharge instructions. Addressed all questions and concerns. Awaiting Iv team to de-access port. ?Jerene Pitch ? ?

## 2022-04-15 NOTE — Progress Notes (Signed)
Per pharmacy, DuoNeb on backorder.  Changed to albuterol nebulizer. ?

## 2022-04-15 NOTE — TOC Initial Note (Addendum)
Transition of Care (TOC) - Initial/Assessment Note  ? ? ?Patient Details  ?Name: [name] ?MRN: [id number] ?Date of Birth: 09-Nov-1956 ? ?Transition of Care [facility]) CM/SW Contact:    ?Dessa Phi, RN ?Phone Number: ?04/15/2022, 11:51 AM ? ?Clinical Narrative:Referral for hospice-spoke to sister [name] chose [facility]-dme needed-home 02,3n1, they will wait on hospital bed at a later date.Family will transport on own.[place] if accepted for [facility]-home w/hospice.    ?-1:29p-[facility]ed by [facility] to delivery home 02 travel tank to rm prior d/c.DME [employer] to deliver to home-home 02,bsc,neb machine prior d/c.No further CM needs.              ? ? ?Expected Discharge Plan: [name] ?Barriers to Discharge: Continued Medical Work up ? ? ?Patient Goals and CMS Choice ?Patient states their goals for this hospitalization and ongoing recovery are:: Home ?CMS Medicare.gov Compare Post Acute Care list provided to:: Patient Represenative (must comment) [name] sister [phone]) ?Choice offered to / list presented to : Sibling ? ?Expected Discharge Plan and Services ?Expected Discharge Plan: [place] ?  ?Discharge Planning Services: CM Consult ?Post Acute Care Choice: Hospice ?Living arrangements for the past 2 months: [place] ?Expected Discharge Date: 04/15/22               ?  ?  ?  ?  ?  ?HH Arranged: RN ?[place] Agency: Start ?Date HH Agency Contacted: 04/15/22 ?Time [place]: 1150 ?Representative spoke with at [place]: [place] ? ?Prior Living Arrangements/Services ?Living arrangements for the past 2 months: [name] ?Lives with:: Adult Children ?Patient language and need for interpreter reviewed:: Yes ?Do you feel safe going back to the place where you live?: Yes      ?Need for Family Participation in Patient Care: Yes (Comment) ?Care giver support system in place?: Yes (comment) ?   ?Criminal Activity/Legal Involvement Pertinent to Current Situation/Hospitalization: No - Comment as needed ? ?Activities of Daily Living ?Home Assistive Devices/Equipment: Chana Bode (specify type), Shower chair with back, Grab bars around toilet, Grab bars in shower ?ADL Screening (condition at time of admission) ?Patient's cognitive ability adequate to safely complete daily activities?: Yes ?Is the patient deaf or have difficulty hearing?: No ?Does the patient have difficulty seeing, even when wearing glasses/contacts?: No ?Does the patient have difficulty concentrating, remembering, or making decisions?: No ?Patient able to express need for assistance with ADLs?: Yes ?Does the patient have difficulty dressing or bathing?: No ?Independently performs ADLs?: No ?Communication: Independent ?Dressing (OT): Independent ?Grooming: Independent ?Feeding: Independent ?Bathing: Independent ?Toileting: Needs assistance ?Is this a change from baseline?: Pre-admission baseline ?In/Out Bed: Needs assistance ?Is this a change from baseline?: Pre-admission baseline ?Walks in Home: Needs assistance ?Is this a change from baseline?: Pre-admission baseline ?Does the patient have difficulty walking or climbing stairs?: Yes ?Weakness of Legs: Both ?Weakness of Arms/Hands: Both ? ?Permission Sought/Granted ?Permission sought to share information with : Case Manager ?Permission granted to share information with : Yes, Verbal Permission Granted ? Share Information with NAME: Case Manager ?   ?   ?   ? ?Emotional Assessment ?Appearance:: Appears stated age ?Attitude/Demeanor/Rapport: Gracious ?Affect (typically observed): Accepting ?Orientation: : Oriented to Self, Oriented to Place, Oriented to  Time, Oriented to Situation ?Alcohol / Substance Use: Not Applicable ?Psych Involvement: No (comment) ? ?Admission diagnosis:  Pleural effusion [J90] ?Acute respiratory failure with hypoxia ([place]) [  J96.01] ?Acute on chronic respiratory  failure with hypoxia (HCC) [J96.21] ?Anemia, unspecified type [D64.9] ?Patient Active Problem List  ? Diagnosis Date Noted  ? Atypical chest pain 04/14/2022  ? Elevated serum creatinine 04/12/2022  ? Protein-calorie malnutrition, severe 04/12/2022  ? Dyspnea on minimal exertion 04/11/2022  ? Hypomagnesemia 04/04/2022  ? Weight loss, non-intentional 03/26/2022  ? Acute respiratory failure with hypoxia ([place]) 03/09/2022  ? History of stroke 02/22/2022  ? History of CVA (cerebrovascular accident) 02/17/2022  ? Generalized weakness 12/21/2021  ? Left ear pain 09/25/2021  ? Hypothyroidism 09/25/2021  ? Yeast infection of the skin 09/25/2021  ? Dehydration 02/06/2021  ? Dysuria 01/09/2021  ? Anemia due to antineoplastic chemotherapy 12/11/2020  ? Drug-induced hyperglycemia 09/27/2020  ? Deficiency anemia 08/30/2020  ? 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 Other fatigue 06/22/2020  ? Pancytopenia ([place]) 06/20/2020  ? Goals of care, counseling/discussion 06/01/2020  ? Lung nodule seen on imaging study 05/19/2020  ? Nausea and vomiting 05/19/2020  ? Hydroureter on left 05/18/2020  ? 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 Cancer associated pain 05/11/2020  ? Other constipation 05/11/2020  ? Squamous cell cancer, anus (HCC)   ? DOE (dyspnea on exertion) 09/17/2018  ? 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 Endometrial cancer ([place]) 05/26/2018  ? Pain in right hand 05/12/2018  ? Cervical intraepithelial neoplasia 05/06/2018  ? Poor dentition 07/07/2017  ? Non-productive cough 03/17/2017  ? Hyperlipidemia, mixed 02/17/2017  ? 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 Genital herpes 02/09/2015  ? Fever and chills 01/03/2014  ? Wheezing 01/03/2014  ? Bifid uvula   ? Bilateral lower extremity edema 09/09/2011  ? Osteopenia 07/18/2011  ? Upper airway cough syndrome vs cough variant asthma  06/24/2011  ? Pulmonary sarcoidosis ([place])  03/07/2008  ? BMI 38.0-38.9,adult 03/07/2008  ? Essential hypertension 03/07/2008  ? GERD (gastroesophageal reflux disease) 03/07/2008  ? History of anal cancer 12/17/2003  ? ?PCP:  [name], [name], MD ?Pharmacy:   ?[facility] [address] ?[place] [address] ?Phone: [phone] Fax: [phone] ? ?[facility] ?[address] ?[place] [address] ?Phone: [phone] Fax: [phone] ? ? ? ? ?Social Determinants of Health (SDOH) Interventions ?  ? ?Readmission Risk Interventions ?   ? View : No data to display.  ?  ?  ?  ? ? ? ?

## 2022-04-15 NOTE — Discharge Summary (Signed)
? ?Physician Discharge Summary  ?[name] ZOX:[id number] DOB: [birth year] DOA: 04/11/2022 ? ?PCP: [name], [name], MD ? ?Admit date: 04/11/2022 ?Discharge date: 04/15/2022 ?Admitted From: Home ?Disposition: Home with home hospice ? ?Discharge Condition: Stable but guarded prognosis ?CODE STATUS: DNR/DNI ? Follow-up Information   ? ? [facility] Follow up.   ?Why: home nursing visit. ?Contact information: ?[address] ?[place] [address] ?[phone] ? ?  ?  ? ?  ?  ? ?  ? ? 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  CBC significant for pancytopenia with WBC of 2.6, Hgb of 7.8 and platelet of 73.  Cardiac markers including BNP and troponin negative.  CTA chest negative for PE but decreased linear opacities of RLL and RML, severe narrowing of proximal RLL and RML bronchi concerning for metastatic adenopathy superimposed on sarcoidosis and unchanged bilateral solid pulmonary nodules similar to her recent CT chest on 3/28.  Pulmonology and palliative medicine consulted.   ? ?Hemoglobin reached nadir at 7.3.  Transfused 1 unit with appropriate response and improvement in his symptoms.  Palliative medicine started Roxanol and Adderall.  CODE STATUS changed to DNR/DNI.  Eventually, the decision is made to stop further chemotherapy and go home with home hospice.  TOC helped with referral to [place] per patient and family preference. ? ? ?  ? ?See individual problem list below for more on hospital course. ? ?Problems addressed during this hospitalization ?* Dyspnea on minimal exertion ?Multifactorial  including metastasis from endometrial cancer and pulmonary sarcoidosis with mass effect, anemia and chemotherapy.  PE excluded by CTA chest.  Cardiac markers negative.  Maintain saturation at 97% with ambulation on RA but very dyspneic.  Symptoms improved after blood transfusion. ?-Decision made to go home with home hospice ?-Continue home prednisone for pulmonary sarcoidosis ?-Continue home Symbicort.  Added DuoNeb as needed. ?-Added Roxanol per recommendation by palliative medicine ? ?Generalized weakness ?Multifactorial including malignancy, chemotherapy, poor p.o. 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  TSH and CK within normal.  Cardiac enzymes within normal.  Low suspicion for infectious process.  ?Going home with home hospice.  Gave Rx for Adderall per palliative medicine recommendation ?Hospice to provide appropriate DME's ? ?Atypical chest pain ?Patient had substernal chest pain that she described as indigestion overnight.  EKG and serial troponin negative.  Pain resolved after IV morphine.  She she has some indigestion after her breakfast this morning.  She is already on Protonix 40 mg daily.  ?-Increase Protonix to twice daily while she is on steroid ? ?Protein-calorie malnutrition, severe ?Nutrition Status: ?Nutrition Problem: Severe Malnutrition ?Etiology: chronic illness (endometrial cancer) ?Signs/Symptoms: energy intake < or equal to 75% for > or equal to 1 month, percent weight loss ?Percent weight loss: 16 % ?Interventions: Ensure Enlive (each supplement provides 350kcal and 20 grams of protein), MVI, Liberalize Diet ? ?Pancytopenia ([place]) ?Likely from chemotherapy.  Hemoglobin improved after 1 unit.  Leukopenia and thrombocytopenia stable. ? ?Goals of care, counseling/discussion ?Now DNR/DNI.  Decided to stop chemotherapy and go home with home hospice. ?-As needed oxycodone, Roxanol and Adderall ?-Home oxygen, inhalers and nebs ?-TOC assisted with hospice referral ?-Hospice to arrange  DME ? ?Pulmonary sarcoidosis (  HCC) ?Continue prednisone 20 mg daily per pulmonology ? ?Endometrial cancer ([place]) ?Decided to stop chemotherapy and go home with home hospice to focus on quality of life and comfort ? ?Essential hypertension ?Normotensive. ?-Continue home bisoprolol ? ?Elevated serum creatinine ?Resolved. ? ? ?Hypomagnesemia ?Resolved. ? ?Hypothyroidism ?TSH within normal.  Continue home Synthroid. ? ?Hyperlipidemia, mixed ?Continue home statin but reduce dose. ? ?GERD (gastroesophageal reflux disease) ?Increase Protonix to twice daily while on steroid ? ? ? ? ?Vital signs ?Vitals:  ? 04/15/22 0519 04/15/22 0746 04/15/22 1208 04/15/22 1349  ?BP: (!) 141/90  123/75   ?Pulse: 92  (!) 102 94  ?Temp: 97.7 ?F (36.5 ?C)  98.3 ?F (36.8 ?C)   ?Resp: 18  (!) 22 (!) 22  ?Height:      ?Weight:      ?SpO2: 94% 90% 96%   ?TempSrc: Oral  Oral   ?BMI (Calculated):      ?  ? ?Discharge exam ? ?GENERAL: No apparent distress.  Nontoxic. ?HEENT: MMM.  Vision and hearing grossly intact.  ?NECK: Supple.  No apparent JVD.  ?RESP:  No IWOB.  Fair aeration bilaterally. ?CVS:  RRR. Heart sounds normal.  ?ABD/GI/GU: BS+. Abd soft, NTND.  ?MSK/EXT:  Moves extremities. No apparent deformity. No edema.  ?SKIN: no apparent skin lesion or wound ?NEURO: Awake and alert. Oriented appropriately.  No apparent focal neuro deficit. ?PSYCH: Calm. Normal affect.  ? ?Discharge Instructions ?Discharge Instructions   ? ? Diet general   Complete by: As directed ?  ? Discharge instructions   Complete by: As directed ?  ? It has been a pleasure taking care of you! ? ?You were hospitalized due to shortness of breath and fatigue likely from anemia, chemotherapy, cancer and sarcoidosis.  Your symptoms improved after blood transfusion.  We are discharging you  with hospice to follow-up at home.  We have added Adderall for fatigue.  We have also added Roxanol as needed for severe pain or shortness of breath.  Review your new medication list and the  directions on your medications before you take them. ? ? ?Take care,  ? For home use only DME Nebulizer machine   Complete by: As directed ?  ? Patient needs a nebulizer to treat with the following condition: Shortness of breath  ? Length of Need: Lifetime  ? Increase activity slowly   Complete by: As directed ?  ? ?  ? ?Allergies as of 04/15/2022   ? ?   Reactions  ? Shellfish Allergy Hives, Itching, Swelling  ? Penicillins Hives, Itching  ? Has patient had a PCN reaction causing immediate rash, facial/tongue/throat swelling, SOB or lightheadedness with hypotension: Yes ?Has patient had a PCN reaction causing severe rash involving mucus membranes or skin necrosis: Yes ?Has patient had a PCN reaction that required hospitalization: Yes ?Has patient had a PCN reaction occurring within the last 10 years: No ?If all of the above answers are "NO", then may proceed with Cephalosporin use.  ? ?  ? ?  ?Medication List  ?  ? ?STOP taking these medications   ? ?aspirin 81 MG EC tablet ?  ?famotidine 20 MG tablet ?Commonly known as: PEPCID ?  ?hydrochlorothiazide 25 MG tablet ?Commonly known as: HYDRODIURIL ?  ? ?  ? ?TAKE these medications   ? ?Adult One Daily Gummies Chew ?Chew 1 tablet by mouth every morning. ?  ?albuterol 108 (90 Base) MCG/ACT inhaler ?Commonly known as: ProAir HFA ?2 puffs every 4 hours as  needed only  if your can't catch your breath ?What changed:  ?how much to take ?how to take this ?when to take this ?reasons to take this ?additional instructions ?  ?amphetamine-dextroamphetamine 10 MG tablet ?Commonly known as: ADDERALL ?Take 1 tablet (10 mg total) by mouth daily with breakfast. ?  ?atorvastatin 80 MG tablet ?Commonly known as: LIPITOR ?Take 1 tablet (80 mg total) by mouth daily. ?What changed: when to take this ?  ?bisoprolol 10 MG tablet ?Commonly known as: ZEBETA ?Take 1 tablet (10 mg total) by mouth in the morning and at bedtime. ?  ?cetirizine 10 MG tablet ?Commonly known as: Allergy  (Cetirizine) ?Take 1 tablet (10 mg total) by mouth daily. ?What changed:  ?when to take this ?reasons to take this ?  ?desonide 0.05 % lotion ?Commonly known as: DesOwen ?Apply topically 2 (two) times daily. Continue following with d

## 2022-04-15 NOTE — Progress Notes (Addendum)
HEMATOLOGY-ONCOLOGY PROGRESS NOTE ? ?I saw the patient, edited the notes as follows ?Her daughter, sister and brother are present in the room ? ?ASSESSMENT AND PLAN: ?Endometrial cancer ([place]) ?Clinically, she is barely improving ?Her cough is slightly better ?Her oral intake is slightly better but she is not hydrating adequately and is completely sedentary at home ?She is getting weaker with signs of Steroid myopathy ?We have extensive discussions about goals of care ?She is now agreeable to stopping chemotherapy and discharging to home with hospice ?Palliative care is following and helping with arrangements ?  ?Non-productive cough ?Overall, her cough is improving ?On prednisone 20 mg daily ?  ?Physical debility ?She is weak with signs of myopathy likely caused by sedentary lifestyle and steroid-induced myopathy ?Prednisone dose was recently reduced ? ?Goals of care ?She has been receiving systemic chemotherapy and has worsening weakness and anorexia ?Had previously not wanted to stop chemotherapy but is now agreeable ?She agrees to hospice ?Arrangements are being made to discharge home with hospice hopefully later today ? ?Mikey Bussing, DNP, AGPCNP-BC, AOCNP ?[name], MD ? ?SUBJECTIVE: ?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 ?Oncology History Overview Note  ?ER/PR neg, MSI stable ?Progressed on [place] ?  ?Endometrial cancer ([place])  ?06/15/2018 Surgery  ? Surgeon: Donaciano Eva  ?   ?Operation: Robotic-assisted laparoscopic total hysterectomy with bilateral salpingoophorectomy, SLN biopsy  ?   ?Operative Findings:  : 6cm uterus, normal tubes and ovaries, omentum adherent to the anterior abdominal wall and uterine fundus. No suspicious nodes. ?  ?06/15/2018 Pathology Results  ? Procedure: Total hysterectomy with bilateral salpingo-oophorectomy. Bilateral obturator sentinel lymph node ?biopsies. ?Histologic type: Endometrioid adenocarcinoma. ?Histologic Grade: FIGO grade 1. ?Myometrial invasion: ?Depth of invasion: 15 mm ?Myometrial thickness: 20 mm ?Uterine Serosa Involvement: Not identified. ?Cervical stromal involvement: Not identified. ?Extent of involvement of other organs: Uninvolved. ?Lymphovascular invasion: Present. ?Regional Lymph Nodes: ?Examined: 2 Sentinel ?0 Non-sentinel ?2 Total ?Lymph nodes with metastasis: 0 ?Isolated tumor cells (< 0.2 mm): 2 ?Micrometastasis: (> 0.2 mm and < 2.0 mm): 0 ?Macrometastasis: (> 2.0 mm): 0 ?Extracapsular extension: N/A. ?Tumor block for ancillary studies: 4Q-S. ?MMR / MSI testing: Will be ordered. ?Pathologic Stage Classification (pTNM, AJCC 8th edition): pT1b, pN0(i+) ?  ?07/20/2018 Imaging  ? 1. Surgical changes from recent hysterectomy. No findings suspicious for residual tumor or adenopathy in the pelvis. ?2. No CT findings to suggest omental, peritoneal surface or solid organ metastatic disease in the abdomen. ?3. Changes consistent with known sarcoidosis in the chest with bulky calcified mediastinal and hilar lymph nodes and interstitial lung disease. I do not see any definite CT findings to suggest metastatic disease involving the chest. ?  ?08/19/2018 - 09/17/2018 Radiation Therapy  ? 08/19/18 - 09/17/18: Vaginal Cuff, 3 cm cylinder with treatment length of 3 cm / 30  Gy delivered  in 5 fractions of 6 Gy, Brachytherapy / HDR, Iridium-192 ?  ?05/2005: Left Chest Wall keloid ?  ?04/2004 - 05/2004: Anus, pelvis /inguinal ( Dr. Tammi Klippel) ?  ?01/19/2020 Imaging  ? 1. No definite CT findings of the abdomen or pelvis to explain pain. ?2. The sigmoid colon and rectum are decompressed although slightly thickened appearing, suggestive of nonspecific infectious, inflammatory, or ischemic colitis. Correlate for referable clinical symptoms. ?3. There is trace, nonspecific free fluid in the low pelvis, which may be reactive, although would be difficult to distinguish from postoperative and post treatment soft tissue change in the pelvis given history of hysterectomy and rectal cancer. ?4. Unchanged prominent retroperitoneal and iliac lymph nodes. No new findings suspicious for metastatic disease in the abdomen or pelvis. ?5. Bibasilar pulmonary fibrosis and mediastinal and hilar lymphadenopathy, better evaluated by prior CT chest and consistent with patient diagnosis of sarcoidosis. ?  ?  ?  ?04/18/2020 Imaging  ? Venous Doppler ?Left:  ?- No evidence of deep vein thrombosis seen in the left lower extremity, from the common femoral through the popliteal veins.  ?- No evidence of superficial venous thrombosis in the left lower extremity.  ?- There is no evidence of venous reflux seen in the left lower extremity.  ?- Rouleax flow noted in the superficial system.  ?  ?05/18/2020 PET scan  ? 1. Complex scan with a combination of hypermetabolic granulomatous disease (sarcoidosis) as well as hypermetabolic metastatic endometrial carcinoma. ?2. Hypermetabolic mediastinal hilar lymph nodes and pulmonary nodularity with bronchovascular thickening is consistent with sarcoidosis. ?3. Solitary hypermetabolic nodule in the medial LEFT lower lobe, new from prior with intense metabolic activity, is concerning for a metastatic deposit to the LEFT lower lobe. ?4. Ill-defined hypermetabolic soft tissue along the LEFT  operator space is most consistent with local recurrence of endometrial carcinoma. There is entrapment of the LEFT ureter by this hypermetabolic soft tissue with mild hydroureter and hydronephrosis on the LEFT. ?5. Additionally there appears to be direct extension from the hypermetabolic left operator space soft tissue to the sigmoid colon with there is a well-defined hypermetabolic lesion in the colon. ?6. More distant hypermetabolic peritoneal metastasis along the ventral peritoneal surface of the RIGHT abdomen beneath RIGHT rectus abdominus muscle. ?7. Hypermetabolic lymph nodes in the porta hepatis are favored related to sarcoidosis. ?  ?05/19/2020 Cancer Staging  ? Staging form: Corpus Uteri - Carcinoma and Carcinosarcoma, AJCC 8th Edition ?- Clinical stage from 05/19/2020: Stage IVB (rcT1, cN2a, cM1) - Signed by [name], MD on 05/19/2020 ? ?  ?05/26/2020 Procedure  ? Successful placement of a right internal jugular approach power injectable Port-A-Cath. The catheter is ready for immediate use ?  ?05/29/2020 - 09/27/2020 Chemotherapy  ? The patient had carboplatin and taxol for chemotherapy treatment.  ? ?  ?07/31/2020 Imaging  ? 1. Asymmetric soft tissue mass extending from the left vaginal cuff has decreased in size in the interval. No residual peritoneal nodularity. Pulmonary metastases are stable with exception of a minimally improved left lower lobe nodule. ?2. Stable soft tissue nodule associated with the right rectus abdominus muscle. ?3. Interval resolution of left hydronephrosis. ?4. Calcified mediastinal and hilar lymph nodes, together with pulmonary parenchymal fibrosis, findings which are in keeping with patient's known diagnosis of sarcoid. ?5. Aortic atherosclerosis (ICD10-I70.0). Coronary artery calcification. ?  ?10/27/2020 Imaging  ? 1. Essentially stable appearance of the soft tissue density nodule posteriorly in the left rectus abdominus muscle, currently 1.8 by 1.2 cm, previously 11 mm. ?2.  Stable  calcified adenopathy in the mediastinum and hila, compatible with prior granulomatous disease. ?3. Stable small right lower lobe pulmonary nodules. ?4. Stable soft tissue density prominence along the left vaginal cuff ex

## 2022-04-16 ENCOUNTER — Other Ambulatory Visit: Payer: Self-pay | Admitting: Hematology and Oncology

## 2022-04-16 ENCOUNTER — Encounter: Payer: Self-pay | Admitting: Hematology and Oncology

## 2022-04-16 ENCOUNTER — Other Ambulatory Visit (HOSPITAL_COMMUNITY): Payer: Self-pay

## 2022-04-16 DIAGNOSIS — D869 Sarcoidosis, unspecified: Secondary | ICD-10-CM

## 2022-04-16 MED ORDER — HALOPERIDOL 1 MG PO TABS
ORAL_TABLET | ORAL | 0 refills | Status: AC
  Filled 2022-04-16: qty 30, 5d supply, fill #0

## 2022-04-16 MED ORDER — TRAZODONE HCL 50 MG PO TABS
ORAL_TABLET | ORAL | 1 refills | Status: AC
  Filled 2022-04-16: qty 15, 15d supply, fill #0

## 2022-04-16 MED ORDER — MORPHINE SULFATE (CONCENTRATE) 10 MG /0.5 ML PO SOLN
ORAL | 0 refills | Status: AC
  Filled 2022-04-16: qty 30, 10d supply, fill #0

## 2022-04-16 MED ORDER — SENNOSIDES-DOCUSATE SODIUM 8.6-50 MG PO TABS
ORAL_TABLET | ORAL | 1 refills | Status: AC
  Filled 2022-04-16: qty 30, 4d supply, fill #0

## 2022-04-16 MED ORDER — MORPHINE SULFATE (CONCENTRATE) 10 MG /0.5 ML PO SOLN
ORAL | 0 refills | Status: DC
  Filled 2022-04-16: qty 30, 15d supply, fill #0

## 2022-04-17 ENCOUNTER — Other Ambulatory Visit (HOSPITAL_COMMUNITY): Payer: Self-pay

## 2022-04-17 MED ORDER — PREDNISONE 20 MG PO TABS
20.0000 mg | ORAL_TABLET | Freq: Every day | ORAL | 3 refills | Status: AC
  Filled 2022-04-17: qty 15, 15d supply, fill #0

## 2022-04-18 ENCOUNTER — Other Ambulatory Visit (HOSPITAL_COMMUNITY): Payer: Self-pay

## 2022-04-19 ENCOUNTER — Encounter: Payer: Self-pay | Admitting: Hematology and Oncology

## 2022-04-24 ENCOUNTER — Other Ambulatory Visit (HOSPITAL_COMMUNITY): Payer: Self-pay

## 2022-04-25 ENCOUNTER — Other Ambulatory Visit: Payer: Self-pay | Admitting: Hematology and Oncology

## 2022-04-25 ENCOUNTER — Other Ambulatory Visit: Payer: Self-pay | Admitting: Family Medicine

## 2022-04-25 ENCOUNTER — Other Ambulatory Visit (HOSPITAL_COMMUNITY): Payer: Self-pay

## 2022-04-25 ENCOUNTER — Encounter: Payer: Self-pay | Admitting: Oncology

## 2022-04-25 MED ORDER — ALBUTEROL SULFATE (2.5 MG/3ML) 0.083% IN NEBU
INHALATION_SOLUTION | RESPIRATORY_TRACT | 2 refills | Status: AC
  Filled 2022-04-25: qty 150, 8d supply, fill #0

## 2022-04-25 MED ORDER — LEVOTHYROXINE SODIUM 100 MCG PO TABS
ORAL_TABLET | ORAL | 2 refills | Status: AC
  Filled 2022-04-25: qty 15, 15d supply, fill #0

## 2022-04-25 MED ORDER — BISOPROLOL FUMARATE 10 MG PO TABS: 10.0000 mg | ORAL_TABLET | Freq: Two times a day (BID) | ORAL | 2 refills | Status: AC

## 2022-04-25 MED ORDER — BISOPROLOL FUMARATE 10 MG PO TABS
10.0000 mg | ORAL_TABLET | Freq: Two times a day (BID) | ORAL | 2 refills | Status: AC
  Filled 2022-04-25: qty 30, 15d supply, fill #0

## 2022-04-25 MED ORDER — LEVOTHYROXINE SODIUM 100 MCG PO TABS: ORAL_TABLET | ORAL | 3 refills | Status: AC

## 2022-04-25 MED ORDER — PREDNISONE 20 MG PO TABS
20.0000 mg | ORAL_TABLET | Freq: Every day | ORAL | 3 refills | Status: AC
  Filled 2022-04-25: qty 15, 15d supply, fill #0

## 2022-04-25 NOTE — Progress Notes (Signed)
Received an email from [facility] requesting a refill of bisoprolol.  Called Manus Gunning at [facility] and she will send a message to [facility] care team to refill it. ?

## 2022-04-27 ENCOUNTER — Other Ambulatory Visit (HOSPITAL_COMMUNITY): Payer: Self-pay

## 2022-04-30 ENCOUNTER — Telehealth: Payer: Self-pay | Admitting: Family Medicine

## 2022-04-30 ENCOUNTER — Telehealth: Payer: Self-pay

## 2022-04-30 NOTE — Telephone Encounter (Signed)
Pt passed away 05-10-2022 at 11:32pm ?

## 2022-04-30 NOTE — Telephone Encounter (Signed)
[employer], [employer] with hospice called and left a message Vincent passed away 05-26-2022. ?

## 2022-05-16 DEATH — deceased

## 2022-10-28 ENCOUNTER — Ambulatory Visit: Payer: Medicare HMO

## 2023-03-13 IMAGING — CT CT CHEST-ABD-PELV W/ CM
2 of 5 series · 13 of 36 positions shown, 15 images · IV contrast (omnipaque)
Comparison: 01/19/2021

CLINICAL DATA: Endometrial cancer, ongoing chemotherapy, history of
rectal cancer status post chemotherapy and XRT

EXAM:
CT CHEST, ABDOMEN, AND PELVIS WITH CONTRAST
TECHNIQUE: Multidetector CT imaging of the chest, abdomen and pelvis was
performed following the standard protocol during bolus
administration of intravenous contrast.
CONTRAST:  100mL OMNIPAQUE IOHEXOL 300 MG/ML SOLN, additional oral
enteric contrast

[Series 2: cap with · axial · 0.80mm/px · z∈[+1082,+1572]mm · 10 of 120 slices shown, 12 images]
[im 11/120  mediastinal]
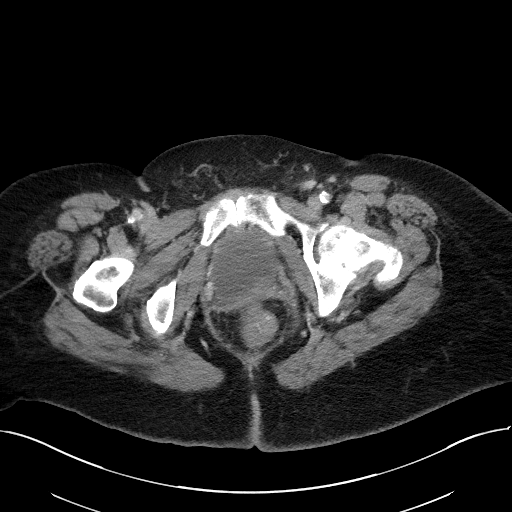
[im 11/120  bone]
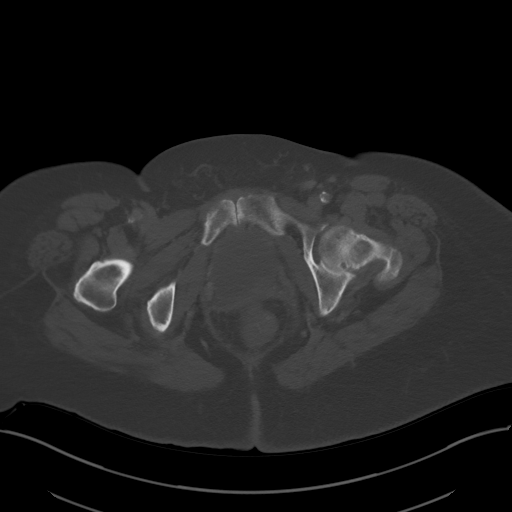
[im 22/120  mediastinal]
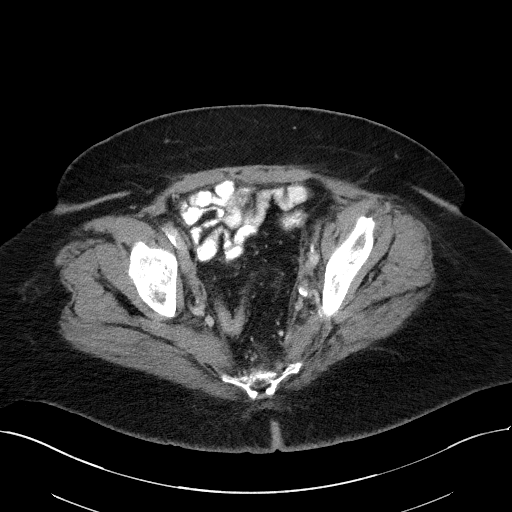
[im 33/120  mediastinal]
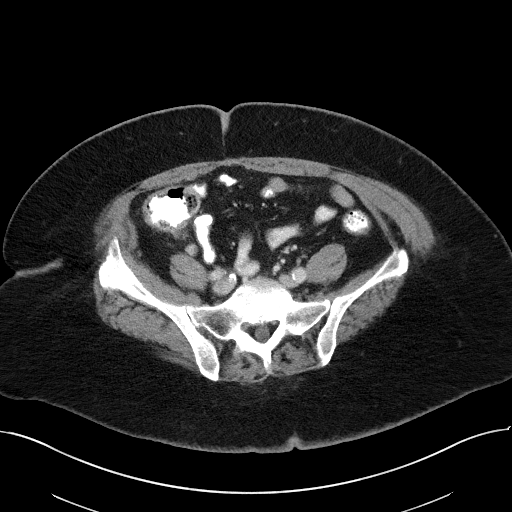
[im 44/120  mediastinal]
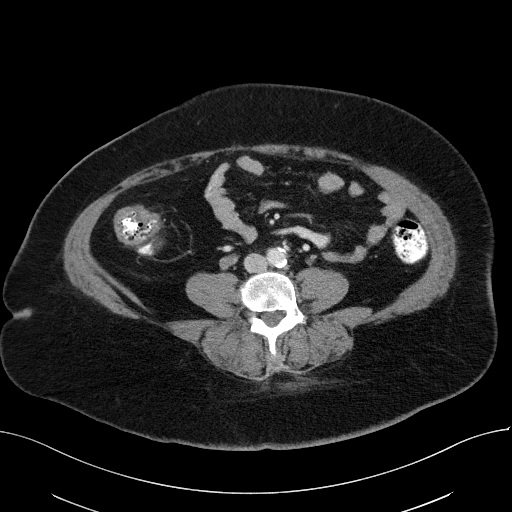
[im 55/120  mediastinal]
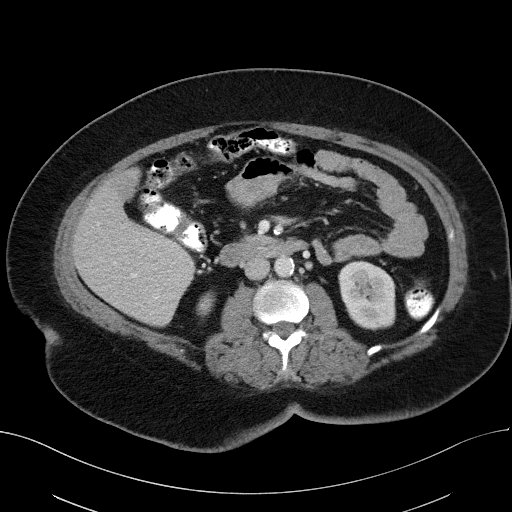
[im 65/120  mediastinal]
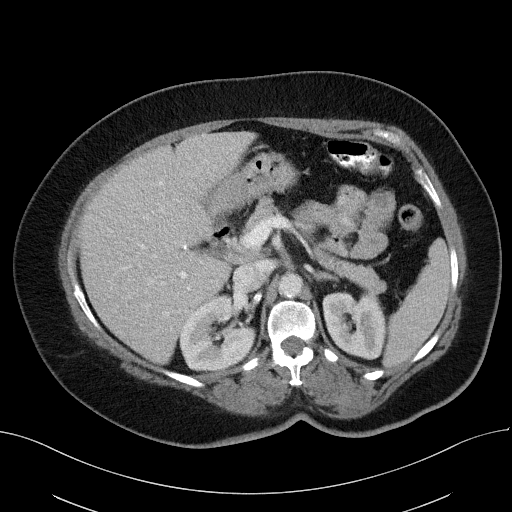
[im 76/120  mediastinal]
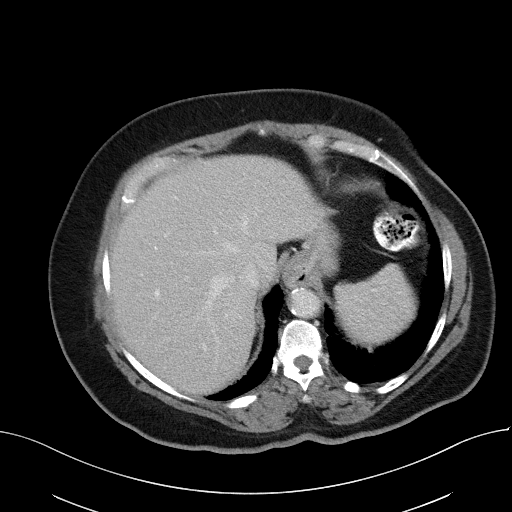
[im 87/120  mediastinal]
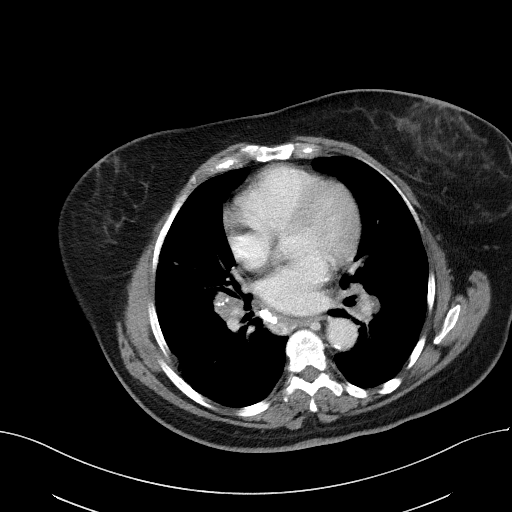
[im 98/120  mediastinal]
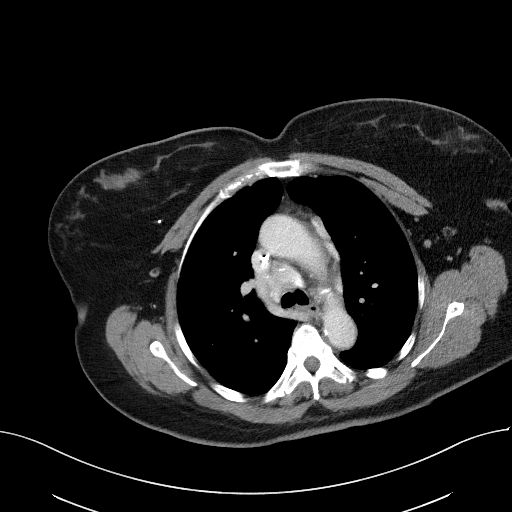
[im 98/120  bone]
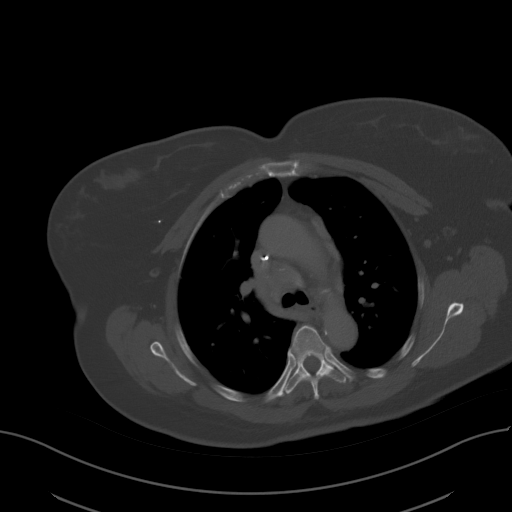
[im 109/120  mediastinal]
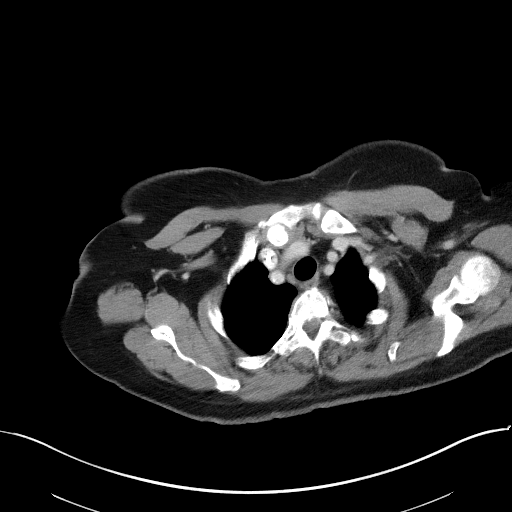

[Series 4: coronals · coronal · 0.82mm/px · 3 of 160 slices shown]
[im 32/160  mediastinal]
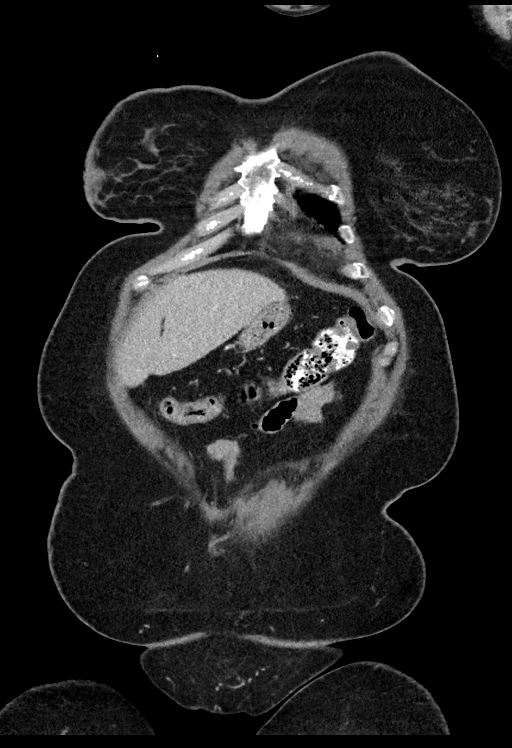
[im 64/160  mediastinal]
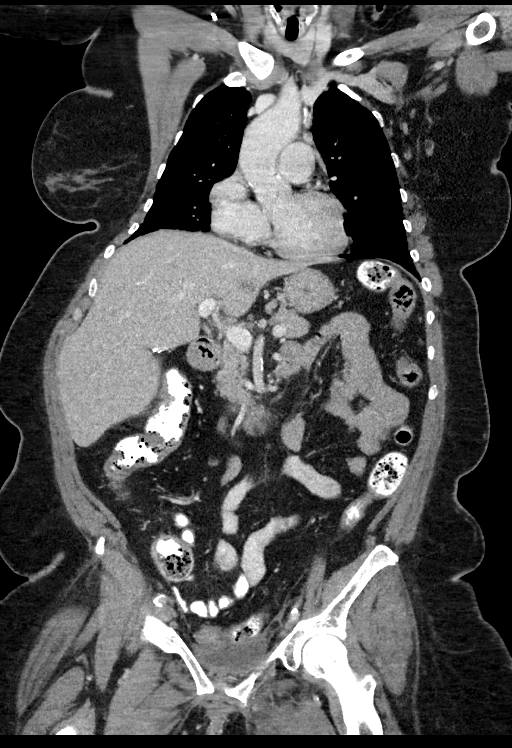
[im 96/160  mediastinal]
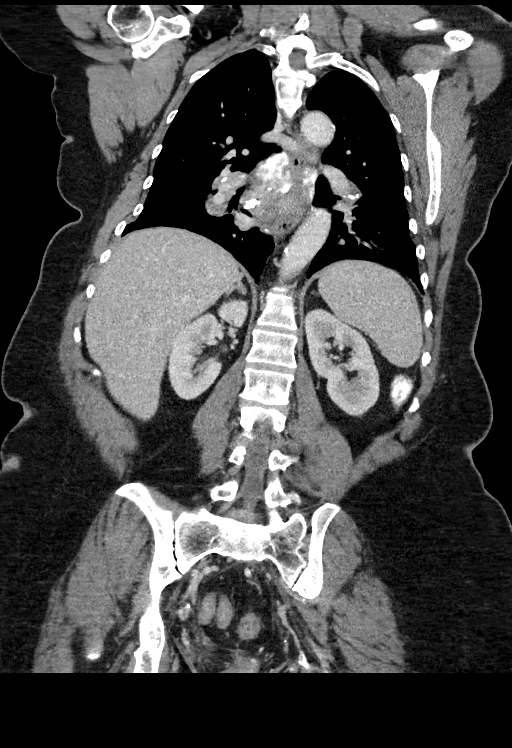

[13 of 36 positions shown; findings below may reference images not displayed]

FINDINGS: CT CHEST FINDINGS

Cardiovascular: Right chest port catheter. Normal heart size. No
pericardial effusion.

Mediastinum/Nodes: Unchanged enlarged, calcified mediastinal and
hilar lymph nodes. Thyroid gland, trachea, and esophagus demonstrate
no significant findings.

Lungs/Pleura: Significant interval decrease in size of multiple
pulmonary nodules, with resolution of some previously seen nodules,
an index nodule of the dependent right lower lobe measuring 4 mm,
previously 6 mm (series 6, image 90). Near complete resolution of
other nodules in the adjacent right lower lobe, as well as near
complete resolution of a previously seen 7 mm nodule of the lingula
(series 6, image 92). Redemonstrated mild pulmonary fibrosis in a
bibasilar predominant pattern featuring irregular peripheral
interstitial opacity, subpleural bronchiolectasis, and probable
honeycombing. No pleural effusion or pneumothorax.

Musculoskeletal: No chest wall mass or suspicious bone lesions
identified.

CT ABDOMEN PELVIS FINDINGS

Hepatobiliary: No focal liver abnormality is seen. Status post
cholecystectomy. No biliary dilatation.

Pancreas: Unremarkable. No pancreatic ductal dilatation or
surrounding inflammatory changes.

Spleen: Normal in size without significant abnormality.

Adrenals/Urinary Tract: Adrenal glands are unremarkable. Kidneys are
normal, without renal calculi, solid lesion, or hydronephrosis.
There has been interval resolution of previously seen left
hydronephrosis and hydroureter. Bladder is unremarkable.

Stomach/Bowel: Stomach is within normal limits. Appendix appears
normal. No evidence of bowel wall thickening, distention, or
inflammatory changes.

Vascular/Lymphatic: Aortic atherosclerosis. Interval decrease in
size of enlarged retroperitoneal lymph nodes, index left
retroperitoneal node measuring 1.0 x 0.6 cm, previously 2.0 x 1.4 cm
(series 2, image 71).

Reproductive: Status post hysterectomy. Significant interval
decrease in size of soft tissue at the left aspect of the vaginal
cuff, measuring approximately 3.6 x 1.5 cm, previously 4.3 x 2.9 cm
when measured similarly (series 2, image 103).

Other: No abdominal wall hernia or abnormality. No abdominopelvic
ascites.

Musculoskeletal: No acute or significant osseous findings.
Significant interval decrease in size of a soft tissue nodule within
the posterior aspect of the right rectus abdominus muscle body,
measuring approximately 1.6 x 1.1 cm, previously 2.0 x 2.0 cm
(series 2, image 81). A previously seen soft tissue nodule anterior
to the sigmoid colon is resolved.
IMPRESSION: 1. Interval decrease in size of multiple pulmonary nodules, with
resolution of some previously seen nodules.
2. Interval decrease in size of enlarged retroperitoneal lymph
nodes.
3. Interval decrease in size of soft tissue at the left aspect of
the vaginal cuff status post hysterectomy.
4. Interval decrease in size of a soft tissue nodule within the
posterior aspect of the right rectus abdominus muscle body.
5. Constellation of findings is consistent with treatment response
of locally recurrent, nodal, and distant metastatic disease.
6. There has been interval resolution of previously seen left
hydronephrosis and hydroureter.
7. Redemonstrated mild pulmonary fibrosis in a UIP pattern.

Aortic Atherosclerosis ([id number]-JS3.3).
